# Patient Record
Sex: Male | Born: 1935
Health system: Southern US, Community
[De-identification: ages and names within clinical notes are randomized; demographics above are authoritative.]

## PROBLEM LIST (undated history)

## (undated) DIAGNOSIS — E119 Type 2 diabetes mellitus without complications: Secondary | ICD-10-CM

## (undated) DIAGNOSIS — Z969 Presence of functional implant, unspecified: Secondary | ICD-10-CM

## (undated) DIAGNOSIS — I219 Acute myocardial infarction, unspecified: Secondary | ICD-10-CM

## (undated) DIAGNOSIS — R600 Localized edema: Secondary | ICD-10-CM

## (undated) DIAGNOSIS — C760 Malignant neoplasm of head, face and neck: Secondary | ICD-10-CM

## (undated) DIAGNOSIS — I2510 Atherosclerotic heart disease of native coronary artery without angina pectoris: Secondary | ICD-10-CM

## (undated) DIAGNOSIS — Z7901 Long term (current) use of anticoagulants: Secondary | ICD-10-CM

## (undated) DIAGNOSIS — I739 Peripheral vascular disease, unspecified: Secondary | ICD-10-CM

## (undated) DIAGNOSIS — I4891 Unspecified atrial fibrillation: Secondary | ICD-10-CM

## (undated) DIAGNOSIS — R0989 Other specified symptoms and signs involving the circulatory and respiratory systems: Secondary | ICD-10-CM

## (undated) DIAGNOSIS — I639 Cerebral infarction, unspecified: Secondary | ICD-10-CM

## (undated) DIAGNOSIS — E1142 Type 2 diabetes mellitus with diabetic polyneuropathy: Secondary | ICD-10-CM

## (undated) DIAGNOSIS — Z98811 Dental restoration status: Secondary | ICD-10-CM

## (undated) DIAGNOSIS — D509 Iron deficiency anemia, unspecified: Secondary | ICD-10-CM

## (undated) DIAGNOSIS — M1990 Unspecified osteoarthritis, unspecified site: Secondary | ICD-10-CM

## (undated) DIAGNOSIS — I10 Essential (primary) hypertension: Secondary | ICD-10-CM

## (undated) DIAGNOSIS — L0390 Cellulitis, unspecified: Secondary | ICD-10-CM

## (undated) DIAGNOSIS — Z9289 Personal history of other medical treatment: Secondary | ICD-10-CM

## (undated) HISTORY — DX: Long term (current) use of anticoagulants: Z79.01

## (undated) HISTORY — PX: ORIF TIBIA FRACTURE: SHX5416

## (undated) HISTORY — DX: Atherosclerotic heart disease of native coronary artery without angina pectoris: I25.10

## (undated) HISTORY — PX: FRACTURE SURGERY: SHX138

## (undated) HISTORY — PX: TONSILLECTOMY: SUR1361

## (undated) HISTORY — DX: Essential (primary) hypertension: I10

## (undated) HISTORY — PX: CARDIAC CATHETERIZATION: SHX172

## (undated) HISTORY — DX: Cerebral infarction, unspecified: I63.9

## (undated) HISTORY — DX: Unspecified atrial fibrillation: I48.91

## (undated) HISTORY — DX: Cellulitis, unspecified: L03.90

## (undated) HISTORY — DX: Peripheral vascular disease, unspecified: I73.9

## (undated) HISTORY — DX: Acute myocardial infarction, unspecified: I21.9

## (undated) HISTORY — DX: Localized edema: R60.0

---

## 1989-06-23 HISTORY — PX: CORONARY ARTERY BYPASS GRAFT: SHX141

## 1991-10-24 HISTORY — PX: SQUAMOUS CELL CARCINOMA EXCISION: SHX2433

## 2001-03-04 ENCOUNTER — Other Ambulatory Visit: Admission: RE | Admit: 2001-03-04 | Discharge: 2001-03-04 | Payer: Self-pay | Admitting: Urology

## 2001-03-15 ENCOUNTER — Encounter: Payer: Self-pay | Admitting: Urology

## 2001-03-15 ENCOUNTER — Ambulatory Visit (HOSPITAL_COMMUNITY): Admission: RE | Admit: 2001-03-15 | Discharge: 2001-03-15 | Payer: Self-pay | Admitting: Urology

## 2001-03-28 ENCOUNTER — Encounter: Payer: Self-pay | Admitting: Urology

## 2001-03-28 ENCOUNTER — Ambulatory Visit (HOSPITAL_COMMUNITY): Admission: RE | Admit: 2001-03-28 | Discharge: 2001-03-28 | Payer: Self-pay | Admitting: Urology

## 2001-03-31 ENCOUNTER — Inpatient Hospital Stay (HOSPITAL_COMMUNITY): Admission: EM | Admit: 2001-03-31 | Discharge: 2001-04-02 | Payer: Self-pay | Admitting: Cardiology

## 2001-03-31 ENCOUNTER — Encounter: Payer: Self-pay | Admitting: Emergency Medicine

## 2001-04-15 ENCOUNTER — Inpatient Hospital Stay (HOSPITAL_COMMUNITY): Admission: AD | Admit: 2001-04-15 | Discharge: 2001-04-17 | Payer: Self-pay | Admitting: Cardiology

## 2001-04-15 ENCOUNTER — Encounter: Payer: Self-pay | Admitting: Cardiology

## 2003-02-23 ENCOUNTER — Encounter: Payer: Self-pay | Admitting: Family Medicine

## 2003-02-23 ENCOUNTER — Ambulatory Visit (HOSPITAL_COMMUNITY): Admission: RE | Admit: 2003-02-23 | Discharge: 2003-02-23 | Payer: Self-pay | Admitting: Family Medicine

## 2003-02-25 ENCOUNTER — Encounter (HOSPITAL_COMMUNITY): Admission: RE | Admit: 2003-02-25 | Discharge: 2003-03-27 | Payer: Self-pay | Admitting: Family Medicine

## 2003-02-25 ENCOUNTER — Encounter: Payer: Self-pay | Admitting: Family Medicine

## 2004-12-23 ENCOUNTER — Ambulatory Visit: Payer: Self-pay | Admitting: Cardiology

## 2004-12-26 ENCOUNTER — Ambulatory Visit: Payer: Self-pay | Admitting: *Deleted

## 2004-12-26 ENCOUNTER — Ambulatory Visit (HOSPITAL_COMMUNITY): Admission: RE | Admit: 2004-12-26 | Discharge: 2004-12-26 | Payer: Self-pay | Admitting: Cardiology

## 2004-12-29 ENCOUNTER — Ambulatory Visit: Payer: Self-pay | Admitting: *Deleted

## 2005-01-09 ENCOUNTER — Ambulatory Visit: Payer: Self-pay | Admitting: Cardiology

## 2005-01-16 ENCOUNTER — Ambulatory Visit: Payer: Self-pay | Admitting: Cardiology

## 2005-02-01 ENCOUNTER — Ambulatory Visit: Payer: Self-pay | Admitting: *Deleted

## 2005-02-16 ENCOUNTER — Ambulatory Visit: Payer: Self-pay | Admitting: Cardiology

## 2005-03-24 ENCOUNTER — Inpatient Hospital Stay (HOSPITAL_COMMUNITY): Admission: AD | Admit: 2005-03-24 | Discharge: 2005-03-29 | Payer: Self-pay | Admitting: Family Medicine

## 2005-06-13 ENCOUNTER — Inpatient Hospital Stay (HOSPITAL_COMMUNITY): Admission: AD | Admit: 2005-06-13 | Discharge: 2005-06-19 | Payer: Self-pay | Admitting: General Surgery

## 2005-06-16 HISTORY — PX: INCISION AND DRAINAGE ABSCESS: SHX5864

## 2005-06-20 ENCOUNTER — Encounter (HOSPITAL_COMMUNITY): Admission: RE | Admit: 2005-06-20 | Discharge: 2005-07-21 | Payer: Self-pay | Admitting: General Surgery

## 2005-07-24 ENCOUNTER — Encounter (HOSPITAL_COMMUNITY): Admission: RE | Admit: 2005-07-24 | Discharge: 2005-08-23 | Payer: Self-pay | Admitting: General Surgery

## 2005-10-03 ENCOUNTER — Ambulatory Visit (HOSPITAL_COMMUNITY): Admission: RE | Admit: 2005-10-03 | Discharge: 2005-10-03 | Payer: Self-pay | Admitting: General Surgery

## 2005-10-17 ENCOUNTER — Ambulatory Visit (HOSPITAL_COMMUNITY): Admission: RE | Admit: 2005-10-17 | Discharge: 2005-10-17 | Payer: Self-pay | Admitting: Family Medicine

## 2006-10-24 ENCOUNTER — Ambulatory Visit: Payer: Self-pay | Admitting: Cardiology

## 2006-10-29 ENCOUNTER — Ambulatory Visit: Payer: Self-pay | Admitting: Cardiology

## 2006-10-29 ENCOUNTER — Encounter (HOSPITAL_COMMUNITY): Admission: RE | Admit: 2006-10-29 | Discharge: 2006-11-28 | Payer: Self-pay | Admitting: Cardiology

## 2006-11-05 ENCOUNTER — Ambulatory Visit: Payer: Self-pay | Admitting: Cardiology

## 2006-11-20 ENCOUNTER — Ambulatory Visit (HOSPITAL_COMMUNITY): Admission: RE | Admit: 2006-11-20 | Discharge: 2006-11-20 | Payer: Self-pay | Admitting: General Surgery

## 2006-12-05 ENCOUNTER — Ambulatory Visit: Payer: Self-pay | Admitting: Cardiology

## 2007-01-02 ENCOUNTER — Ambulatory Visit: Payer: Self-pay | Admitting: Cardiology

## 2007-01-30 ENCOUNTER — Ambulatory Visit: Payer: Self-pay | Admitting: Cardiology

## 2007-03-05 ENCOUNTER — Ambulatory Visit: Payer: Self-pay | Admitting: Cardiology

## 2007-03-20 ENCOUNTER — Ambulatory Visit: Payer: Self-pay | Admitting: Cardiology

## 2007-04-23 ENCOUNTER — Ambulatory Visit: Payer: Self-pay | Admitting: Cardiology

## 2007-05-20 ENCOUNTER — Inpatient Hospital Stay (HOSPITAL_COMMUNITY): Admission: EM | Admit: 2007-05-20 | Discharge: 2007-05-21 | Payer: Self-pay | Admitting: Emergency Medicine

## 2007-05-23 ENCOUNTER — Ambulatory Visit: Payer: Self-pay | Admitting: Cardiology

## 2007-05-24 ENCOUNTER — Encounter (HOSPITAL_COMMUNITY): Admission: RE | Admit: 2007-05-24 | Discharge: 2007-06-23 | Payer: Self-pay | Admitting: Internal Medicine

## 2007-06-12 ENCOUNTER — Ambulatory Visit: Payer: Self-pay | Admitting: Cardiovascular Disease

## 2007-06-21 ENCOUNTER — Ambulatory Visit: Payer: Self-pay | Admitting: Cardiology

## 2007-07-22 ENCOUNTER — Ambulatory Visit: Payer: Self-pay | Admitting: Cardiology

## 2007-08-19 ENCOUNTER — Ambulatory Visit: Payer: Self-pay | Admitting: Internal Medicine

## 2007-09-02 ENCOUNTER — Ambulatory Visit: Payer: Self-pay | Admitting: Cardiology

## 2007-09-16 ENCOUNTER — Ambulatory Visit: Payer: Self-pay | Admitting: Cardiology

## 2007-09-23 ENCOUNTER — Ambulatory Visit: Payer: Self-pay | Admitting: Cardiology

## 2007-10-07 ENCOUNTER — Ambulatory Visit: Payer: Self-pay | Admitting: Cardiology

## 2007-10-11 ENCOUNTER — Ambulatory Visit: Payer: Self-pay | Admitting: Cardiology

## 2007-10-25 ENCOUNTER — Ambulatory Visit: Payer: Self-pay | Admitting: Cardiology

## 2007-11-11 ENCOUNTER — Ambulatory Visit: Payer: Self-pay | Admitting: Cardiology

## 2007-12-09 ENCOUNTER — Ambulatory Visit: Payer: Self-pay | Admitting: Cardiology

## 2007-12-12 ENCOUNTER — Ambulatory Visit: Payer: Self-pay | Admitting: Cardiovascular Disease

## 2007-12-18 ENCOUNTER — Ambulatory Visit: Payer: Self-pay | Admitting: Cardiovascular Disease

## 2007-12-25 ENCOUNTER — Ambulatory Visit: Payer: Self-pay | Admitting: Cardiology

## 2008-01-08 ENCOUNTER — Ambulatory Visit: Payer: Self-pay | Admitting: Cardiology

## 2008-01-29 ENCOUNTER — Ambulatory Visit: Payer: Self-pay | Admitting: Cardiology

## 2008-02-28 ENCOUNTER — Ambulatory Visit: Payer: Self-pay | Admitting: Internal Medicine

## 2008-03-30 ENCOUNTER — Ambulatory Visit: Payer: Self-pay | Admitting: Cardiology

## 2008-04-10 ENCOUNTER — Ambulatory Visit: Payer: Self-pay | Admitting: Internal Medicine

## 2008-05-08 ENCOUNTER — Ambulatory Visit: Payer: Self-pay | Admitting: Internal Medicine

## 2008-05-29 ENCOUNTER — Ambulatory Visit: Payer: Self-pay | Admitting: Cardiology

## 2008-06-30 ENCOUNTER — Ambulatory Visit: Payer: Self-pay | Admitting: Cardiology

## 2008-07-30 ENCOUNTER — Ambulatory Visit: Payer: Self-pay | Admitting: Cardiology

## 2008-08-20 ENCOUNTER — Ambulatory Visit: Payer: Self-pay | Admitting: Cardiology

## 2008-09-28 ENCOUNTER — Ambulatory Visit: Payer: Self-pay | Admitting: Cardiology

## 2008-10-28 ENCOUNTER — Ambulatory Visit: Payer: Self-pay | Admitting: Cardiology

## 2008-11-04 ENCOUNTER — Ambulatory Visit: Payer: Self-pay | Admitting: Cardiology

## 2008-11-26 ENCOUNTER — Ambulatory Visit: Payer: Self-pay | Admitting: Cardiology

## 2008-12-24 ENCOUNTER — Ambulatory Visit: Payer: Self-pay | Admitting: Cardiology

## 2009-01-25 ENCOUNTER — Ambulatory Visit: Payer: Self-pay | Admitting: Cardiology

## 2009-02-15 ENCOUNTER — Ambulatory Visit: Payer: Self-pay | Admitting: Cardiology

## 2009-03-01 ENCOUNTER — Encounter: Payer: Self-pay | Admitting: Cardiology

## 2009-03-11 ENCOUNTER — Ambulatory Visit: Payer: Self-pay | Admitting: Cardiology

## 2009-04-08 DIAGNOSIS — I4821 Permanent atrial fibrillation: Secondary | ICD-10-CM | POA: Insufficient documentation

## 2009-04-19 ENCOUNTER — Ambulatory Visit: Payer: Self-pay | Admitting: Cardiology

## 2009-05-20 ENCOUNTER — Ambulatory Visit: Payer: Self-pay | Admitting: Cardiology

## 2009-06-07 ENCOUNTER — Encounter: Payer: Self-pay | Admitting: *Deleted

## 2009-06-21 ENCOUNTER — Ambulatory Visit: Payer: Self-pay | Admitting: Cardiology

## 2009-06-21 LAB — CONVERTED CEMR LAB: POC INR: 2

## 2009-07-22 ENCOUNTER — Ambulatory Visit: Payer: Self-pay | Admitting: Cardiology

## 2009-08-19 ENCOUNTER — Ambulatory Visit: Payer: Self-pay | Admitting: Cardiology

## 2009-09-20 ENCOUNTER — Ambulatory Visit: Payer: Self-pay | Admitting: Cardiology

## 2009-09-20 LAB — CONVERTED CEMR LAB: POC INR: 2.3

## 2009-09-23 ENCOUNTER — Telehealth (INDEPENDENT_AMBULATORY_CARE_PROVIDER_SITE_OTHER): Payer: Self-pay | Admitting: *Deleted

## 2009-10-01 ENCOUNTER — Telehealth (INDEPENDENT_AMBULATORY_CARE_PROVIDER_SITE_OTHER): Payer: Self-pay | Admitting: *Deleted

## 2009-10-04 ENCOUNTER — Encounter (INDEPENDENT_AMBULATORY_CARE_PROVIDER_SITE_OTHER): Payer: Self-pay | Admitting: *Deleted

## 2009-10-04 LAB — CONVERTED CEMR LAB
ALT: 20 units/L
ALT: 20 units/L
AST: 16 units/L
AST: 16 units/L
Albumin: 4.1 g/dL
Alkaline Phosphatase: 69 units/L
Alkaline Phosphatase: 69 units/L
BUN: 20 mg/dL
Calcium: 8.7 mg/dL
Calcium: 8.7 mg/dL
Chloride: 106 meq/L
Cholesterol: 112 mg/dL
Cholesterol: 293 mg/dL
Creatinine, Ser: 0.91 mg/dL
Glucose, Bld: 118 mg/dL
HCT: 39.6 %
HDL: 28 mg/dL
Hgb A1c MFr Bld: 6.6 %
LDL Cholesterol: 61 mg/dL
LDL Cholesterol: 61 mg/dL
MCV: 39.6 fL
Platelets: 93 10*3/uL
Potassium: 4.5 meq/L
Sodium: 140 meq/L
Total Protein: 7 g/dL
Triglycerides: 113 mg/dL
Triglycerides: 113 mg/dL

## 2009-11-15 ENCOUNTER — Ambulatory Visit: Payer: Self-pay | Admitting: Cardiology

## 2009-11-15 LAB — CONVERTED CEMR LAB: POC INR: 2.1

## 2009-12-13 ENCOUNTER — Ambulatory Visit: Payer: Self-pay | Admitting: Cardiology

## 2010-01-10 ENCOUNTER — Ambulatory Visit: Payer: Self-pay | Admitting: Cardiology

## 2010-01-10 LAB — CONVERTED CEMR LAB: POC INR: 2.4

## 2010-02-07 ENCOUNTER — Ambulatory Visit: Payer: Self-pay | Admitting: Cardiovascular Disease

## 2010-02-07 LAB — CONVERTED CEMR LAB: POC INR: 2.2

## 2010-03-04 DIAGNOSIS — Z951 Presence of aortocoronary bypass graft: Secondary | ICD-10-CM

## 2010-03-04 DIAGNOSIS — E785 Hyperlipidemia, unspecified: Secondary | ICD-10-CM | POA: Insufficient documentation

## 2010-03-04 DIAGNOSIS — R7303 Prediabetes: Secondary | ICD-10-CM | POA: Insufficient documentation

## 2010-03-04 DIAGNOSIS — I10 Essential (primary) hypertension: Secondary | ICD-10-CM

## 2010-03-04 DIAGNOSIS — E119 Type 2 diabetes mellitus without complications: Secondary | ICD-10-CM

## 2010-03-07 ENCOUNTER — Encounter (INDEPENDENT_AMBULATORY_CARE_PROVIDER_SITE_OTHER): Payer: Self-pay | Admitting: *Deleted

## 2010-03-07 ENCOUNTER — Ambulatory Visit: Payer: Self-pay | Admitting: Cardiology

## 2010-03-08 ENCOUNTER — Ambulatory Visit: Payer: Self-pay | Admitting: Cardiology

## 2010-03-09 ENCOUNTER — Encounter: Payer: Self-pay | Admitting: Cardiology

## 2010-03-11 ENCOUNTER — Encounter (INDEPENDENT_AMBULATORY_CARE_PROVIDER_SITE_OTHER): Payer: Self-pay | Admitting: *Deleted

## 2010-03-11 LAB — CONVERTED CEMR LAB: OCCULT 3: NEGATIVE

## 2010-03-14 ENCOUNTER — Ambulatory Visit: Payer: Self-pay | Admitting: Cardiology

## 2010-03-17 ENCOUNTER — Encounter (INDEPENDENT_AMBULATORY_CARE_PROVIDER_SITE_OTHER): Payer: Self-pay | Admitting: *Deleted

## 2010-04-04 ENCOUNTER — Ambulatory Visit: Payer: Self-pay | Admitting: Cardiology

## 2010-04-04 LAB — CONVERTED CEMR LAB: POC INR: 2.3

## 2010-05-02 ENCOUNTER — Ambulatory Visit: Payer: Self-pay | Admitting: Cardiology

## 2010-05-02 LAB — CONVERTED CEMR LAB: POC INR: 2.1

## 2010-06-01 ENCOUNTER — Ambulatory Visit: Payer: Self-pay | Admitting: Cardiology

## 2010-06-29 ENCOUNTER — Ambulatory Visit: Payer: Self-pay | Admitting: Cardiology

## 2010-06-29 LAB — CONVERTED CEMR LAB: POC INR: 1.9

## 2010-07-27 ENCOUNTER — Ambulatory Visit: Payer: Self-pay | Admitting: Cardiology

## 2010-08-24 ENCOUNTER — Ambulatory Visit: Payer: Self-pay | Admitting: Cardiology

## 2010-09-21 ENCOUNTER — Ambulatory Visit: Payer: Self-pay | Admitting: Cardiology

## 2010-10-19 ENCOUNTER — Ambulatory Visit: Payer: Self-pay | Admitting: Cardiology

## 2010-10-19 LAB — CONVERTED CEMR LAB: POC INR: 2.3

## 2010-11-16 ENCOUNTER — Ambulatory Visit: Admission: RE | Admit: 2010-11-16 | Discharge: 2010-11-16 | Payer: Self-pay | Source: Home / Self Care

## 2010-11-16 LAB — CONVERTED CEMR LAB: POC INR: 7.7

## 2010-11-21 ENCOUNTER — Ambulatory Visit: Admission: RE | Admit: 2010-11-21 | Discharge: 2010-11-21 | Payer: Self-pay | Source: Home / Self Care

## 2010-11-24 NOTE — Medication Information (Signed)
Summary: ccr-lr  Anticoagulant Therapy  Managed by: Edrick Oh, RN PCP: Dr. Newt Minion Supervising MD: Domenic Polite MD, Mikeal Hawthorne Indication 1: Atrial Fibrillation (ICD-427.31) Lab Used: [facility] [place] Site: [place] INR POC 2.0  Dietary changes: no    Health status changes: no    Bleeding/hemorrhagic complications: no    Recent/future hospitalizations: no    Any changes in medication regimen? no    Recent/future dental: no  Any missed doses?: yes     Details: missed 1 dose  Is patient compliant with meds? yes       Allergies: 1)  ! Amoxicillin 2)  ! Sulfa  Anticoagulation Management History:      The patient is taking warfarin and comes in today for a routine follow up visit.  Positive risk factors for bleeding include an age of 75 years or older and presence of serious comorbidities.  The bleeding index is 'intermediate risk'.  Positive CHADS2 values include History of HTN and History of Diabetes.  Negative CHADS2 values include Age > 73 years old.  The start date was 12/23/2004.  Anticoagulation responsible provider: Domenic Polite MD, Mikeal Hawthorne.  INR POC: 2.0.  Cuvette Lot#: [id number].  Exp: 10/11.    Anticoagulation Management Assessment/Plan:      The patient's current anticoagulation dose is Warfarin sodium 2.5 mg tabs: Use as directed by Anticoagualtion Clinic.  The target INR is 2 - 3.  The next INR is due 06/29/2010.  Anticoagulation instructions were given to patient.  Results were reviewed/authorized by Edrick Oh, RN.  He was notified by Edrick Oh RN.         Prior Anticoagulation Instructions: INR 2.1 Continue coumadin 2.59m once daily   Current Anticoagulation Instructions: INR 2.0 Take coumadin 1 1/2 tablets tonight then resume 1 tablet once daily

## 2010-11-24 NOTE — Medication Information (Signed)
Summary: ccr-lr  Anticoagulant Therapy  Managed by: Edrick Oh, RN PCP: Dr. Newt Minion Supervising MD: Lattie Haw MD, Herbie Baltimore Indication 1: Atrial Fibrillation (ICD-427.31) Lab Used: [facility] [place] Site:  INR POC 2.3  Dietary changes: no    Health status changes: no    Bleeding/hemorrhagic complications: no    Recent/future hospitalizations: no    Any changes in medication regimen? no    Recent/future dental: no  Any missed doses?: no       Is patient compliant with meds? yes       Allergies: 1)  ! Amoxicillin 2)  ! Sulfa  Anticoagulation Management History:      The patient is taking warfarin and comes in today for a routine follow up visit.  Positive risk factors for bleeding include an age of 75 years or older and presence of serious comorbidities.  The bleeding index is 'intermediate risk'.  Positive CHADS2 values include History of HTN and History of Diabetes.  Negative CHADS2 values include Age > 76 years old.  The start date was 12/23/2004.  Anticoagulation responsible provider: Lattie Haw MD, Herbie Baltimore.  INR POC: 2.3.  Cuvette Lot#: [id number].  Exp: 10/11.    Anticoagulation Management Assessment/Plan:      The patient's current anticoagulation dose is Warfarin sodium 2.5 mg tabs: Use as directed by Anticoagualtion Clinic.  The target INR is 2 - 3.  The next INR is due 05/02/2010.  Anticoagulation instructions were given to patient.  Results were reviewed/authorized by Edrick Oh, RN.  He was notified by Edrick Oh RN.         Prior Anticoagulation Instructions: INR 2.1 Continue coumadin 2.36m once daily   Current Anticoagulation Instructions: INR 2.3 Continue coumadin 2.525monce daily

## 2010-11-24 NOTE — Medication Information (Signed)
Summary: ccr-lr  Anticoagulant Therapy  Managed by: Edrick Oh, RN PCP: Dr. Newt Minion Supervising MD: Domenic Polite MD, Mikeal Hawthorne Indication 1: Atrial Fibrillation (ICD-427.31) Lab Used: [facility] [place] Site:  INR POC 7.7  Dietary changes: yes       Details: Been on low glycemic diet x 2 wks with rapid wt loss  Health status changes: yes       Details: Had 1 month of diarrhea   Improved now  Bleeding/hemorrhagic complications: no    Recent/future hospitalizations: no    Any changes in medication regimen? no    Recent/future dental: no  Any missed doses?: no       Is patient compliant with meds? yes       Allergies: 1)  ! Amoxicillin 2)  ! Sulfa  Anticoagulation Management History:      The patient is taking warfarin and comes in today for a routine follow up visit.  Positive risk factors for bleeding include an age of 75 years or older and presence of serious comorbidities.  The bleeding index is 'intermediate risk'.  Positive CHADS2 values include History of HTN and History of Diabetes.  Negative CHADS2 values include Age > 23 years old.  The start date was 12/23/2004.  Anticoagulation responsible provider: Domenic Polite MD, Mikeal Hawthorne.  INR POC: 7.7.  Cuvette Lot#: [id number].  Exp: 10/11.    Anticoagulation Management Assessment/Plan:      The patient's current anticoagulation dose is Warfarin sodium 2.5 mg tabs: Use as directed by Anticoagualtion Clinic.  The target INR is 2 - 3.  The next INR is due 11/21/2010.  Anticoagulation instructions were given to patient.  Results were reviewed/authorized by Edrick Oh, RN.  He was notified by Edrick Oh RN.         Prior Anticoagulation Instructions: INR 2.3 Continue coumadin 2.59m once daily   Current Anticoagulation Instructions: INR 7.7 Had several weeks of diarrhea.  Started on Low glycemic diet and lost a lot of weight pset 2 weeks. Hold coumadin x 4 days, take 1 tablet on Sunday and recheck INR  on 11/21/10

## 2010-11-24 NOTE — Medication Information (Signed)
Summary: ccr-lr  Anticoagulant Therapy  Managed by: Edrick Oh, RN PCP: Dr. Newt Minion Supervising MD: Lattie Haw MD, Herbie [place] Indication 1: Atrial Fibrillation (ICD-427.31) Lab Used: [facility] [place] Site: [place] INR POC 2.1  Dietary changes: no    Health status changes: no    Bleeding/hemorrhagic complications: no    Recent/future hospitalizations: no    Any changes in medication regimen? no    Recent/future dental: no  Any missed doses?: no       Is patient compliant with meds? yes       Allergies: 1)  ! Amoxicillin 2)  ! Sulfa  Anticoagulation Management History:      The patient is taking warfarin and comes in today for a routine follow up visit.  Positive risk factors for bleeding include an age of 75 years or older and presence of serious comorbidities.  The bleeding index is 'intermediate risk'.  Positive CHADS2 values include History of HTN and History of Diabetes.  Negative CHADS2 values include Age > 78 years old.  The start date was 12/23/2004.  Anticoagulation responsible provider: Lattie Haw MD, Herbie Baltimore.  INR POC: 2.1.  Exp: 10/11.    Anticoagulation Management Assessment/Plan:      The patient's current anticoagulation dose is Warfarin sodium 2.5 mg tabs: Use as directed by Anticoagualtion Clinic.  The target INR is 2 - 3.  The next INR is due 09/21/2010.  Anticoagulation instructions were given to patient.  Results were reviewed/authorized by Edrick Oh, RN.  He was notified by Edrick Oh RN.         Prior Anticoagulation Instructions: INR 2.1 Continue couamdin 2.71m once daily   Current Anticoagulation Instructions: INR 2.1 Continue coumadin 2.586monce daily

## 2010-11-24 NOTE — Medication Information (Signed)
Summary: ccr-lr  Anticoagulant Therapy  Managed by: Edrick Oh, RN PCP: Dr. Newt Minion Supervising MD: Lattie Haw MD, Herbie Baltimore Indication 1: Atrial Fibrillation (ICD-427.31) Lab Used: [facility] [place] Site: [place] INR POC 2.3  Dietary changes: no    Health status changes: no    Bleeding/hemorrhagic complications: no    Recent/future hospitalizations: no    Any changes in medication regimen? no    Recent/future dental: no  Any missed doses?: yes     Details: missed 2 doses this month  Is patient compliant with meds? yes       Allergies: 1)  ! Amoxicillin 2)  ! Sulfa  Anticoagulation Management History:      The patient is taking warfarin and comes in today for a routine follow up visit.  Positive risk factors for bleeding include an age of 75 years or older and presence of serious comorbidities.  The bleeding index is 'intermediate risk'.  Positive CHADS2 values include History of HTN and History of Diabetes.  Negative CHADS2 values include Age > 85 years old.  The start date was 12/23/2004.  Anticoagulation responsible provider: Lattie Haw MD, Herbie Baltimore.  INR POC: 2.3.  Cuvette Lot#: [id number].  Exp: 10/11.    Anticoagulation Management Assessment/Plan:      The patient's current anticoagulation dose is Warfarin sodium 2.5 mg tabs: Use as directed by Anticoagualtion Clinic.  The target INR is 2 - 3.  The next INR is due 11/16/2010.  Anticoagulation instructions were given to patient.  Results were reviewed/authorized by Edrick Oh, RN.  He was notified by Edrick Oh RN.         Prior Anticoagulation Instructions: INR 2.3 Continue coumadin 2.24m once daily   Current Anticoagulation Instructions: Same as Prior Instructions.

## 2010-11-24 NOTE — Medication Information (Signed)
Summary: ccr-lr  Anticoagulant Therapy  Managed by: Edrick Oh, RN Supervising MD: Johnsie Cancel MD, Collier Salina Indication 1: Atrial Fibrillation (ICD-427.31) Lab Used: [facility] [place] Site: [place] INR POC 2.2  Dietary changes: no    Health status changes: no    Bleeding/hemorrhagic complications: no    Recent/future hospitalizations: no    Any changes in medication regimen? no    Recent/future dental: no  Any missed doses?: no       Is patient compliant with meds? yes       Anticoagulation Management History:      The patient is taking warfarin and comes in today for a routine follow up visit.  Positive risk factors for bleeding include an age of 30 years or older.  The bleeding index is 'intermediate risk'.  Positive CHADS2 values include History of HTN.  Negative CHADS2 values include Age > 47 years old.  The start date was 12/23/2004.  Anticoagulation responsible provider: Johnsie Cancel MD, Collier Salina.  INR POC: 2.2.  Cuvette Lot#: [id number].  Exp: 10/11.    Anticoagulation Management Assessment/Plan:      The target INR is 2 - 3.  The next INR is due 03/07/2010.  Anticoagulation instructions were given to patient.  Results were reviewed/authorized by Edrick Oh, RN.  He was notified by Edrick Oh RN.         Prior Anticoagulation Instructions: INR 2.4 Continue coumadin 2.70m once daily   Current Anticoagulation Instructions: INR 2.2 Continue coumadin 2.531monce daily

## 2010-11-24 NOTE — Medication Information (Signed)
Summary: ccr-lr  Anticoagulant Therapy  Managed by: Edrick Oh, RN Supervising MD: Lattie Haw MD, Herbie Baltimore Indication 1: Atrial Fibrillation (ICD-427.31) Lab Used: [facility]  Site: [place] INR POC 2.1  Dietary changes: no    Health status changes: no    Bleeding/hemorrhagic complications: no    Recent/future hospitalizations: no    Any changes in medication regimen? no    Recent/future dental: no  Any missed doses?: no       Is patient compliant with meds? yes       Anticoagulation Management History:      The patient is taking warfarin and comes in today for a routine follow up visit.  Positive risk factors for bleeding include an age of 70 years or older.  The bleeding index is 'intermediate risk'.  Positive CHADS2 values include History of HTN.  Negative CHADS2 values include Age > 66 years old.  The start date was 12/23/2004.  Anticoagulation responsible provider: Lattie Haw MD, Herbie Baltimore.  INR POC: 2.1.  Cuvette Lot#: [id number].  Exp: 10/11.    Anticoagulation Management Assessment/Plan:      The target INR is 2 - 3.  The next INR is due 04/04/2010.  Anticoagulation instructions were given to patient.  Results were reviewed/authorized by Edrick Oh, RN.  He was notified by Edrick Oh RN.         Prior Anticoagulation Instructions: INR 2.2 Continue coumadin 2.99m once daily   Current Anticoagulation Instructions: INR 2.1 Continue coumadin 2.531monce daily

## 2010-11-24 NOTE — Medication Information (Signed)
Summary: ccr-lr  Anticoagulant Therapy  Managed by: Edrick Oh, RN Supervising MD: Lattie Haw MD, Herbie Baltimore Indication 1: Atrial Fibrillation (ICD-427.31) Lab Used: [facility] [place] Site:  INR POC 2.4  Dietary changes: no    Health status changes: no    Bleeding/hemorrhagic complications: no    Recent/future hospitalizations: no    Any changes in medication regimen? no    Recent/future dental: no  Any missed doses?: no       Is patient compliant with meds? yes       Anticoagulation Management History:      The patient is taking warfarin and comes in today for a routine follow up visit.  Positive risk factors for bleeding include an age of 75 years or older.  The bleeding index is 'intermediate risk'.  Positive CHADS2 values include History of HTN.  Negative CHADS2 values include Age > 101 years old.  The start date was 12/23/2004.  Anticoagulation responsible provider: Lattie Haw MD, Herbie Baltimore.  INR POC: 2.4.  Cuvette Lot#: [id number].  Exp: 10/11.    Anticoagulation Management Assessment/Plan:      The target INR is 2 - 3.  The next INR is due 02/07/2010.  Anticoagulation instructions were given to patient.  Results were reviewed/authorized by Edrick Oh, RN.  He was notified by Edrick Oh RN.         Prior Anticoagulation Instructions: INR 1.9 Take coumadin 2 tablets tonight then resume 1 tablet once daily   Current Anticoagulation Instructions: INR 2.4 Continue coumadin 2.20m once daily

## 2010-11-24 NOTE — Medication Information (Signed)
Summary: ccr-lr  Anticoagulant Therapy  Managed by: Edrick Oh, RN PCP: Dr. Newt Minion Supervising MD: Lattie Haw MD, Herbie Baltimore Indication 1: Atrial Fibrillation (ICD-427.31) Lab Used: [facility] [place] Site: [place] INR POC 1.9  Dietary changes: no    Health status changes: no    Bleeding/hemorrhagic complications: no    Recent/future hospitalizations: no    Any changes in medication regimen? no    Recent/future dental: no  Any missed doses?: yes     Details: missed 2 doses last month  Is patient compliant with meds? yes       Allergies: 1)  ! Amoxicillin 2)  ! Sulfa  Anticoagulation Management History:      The patient is taking warfarin and comes in today for a routine follow up visit.  Positive risk factors for bleeding include an age of 5 years or older and presence of serious comorbidities.  The bleeding index is 'intermediate risk'.  Positive CHADS2 values include History of HTN and History of Diabetes.  Negative CHADS2 values include Age > 59 years old.  The start date was 12/23/2004.  Anticoagulation responsible provider: Lattie Haw MD, Herbie Baltimore.  INR POC: 1.9.  Cuvette Lot#: [id number].  Exp: 10/11.    Anticoagulation Management Assessment/Plan:      The patient's current anticoagulation dose is Warfarin sodium 2.5 mg tabs: Use as directed by Anticoagualtion Clinic.  The target INR is 2 - 3.  The next INR is due 07/27/2010.  Anticoagulation instructions were given to patient.  Results were reviewed/authorized by Edrick Oh, RN.  He was notified by Edrick Oh RN.         Prior Anticoagulation Instructions: INR 2.0 Take coumadin 1 1/2 tablets tonight then resume 1 tablet once daily   Current Anticoagulation Instructions: INR 1.9 Take 1 1/2 tablets tonight and tomorrow night then resume 1 tablet once daily

## 2010-11-24 NOTE — Medication Information (Signed)
Summary: ccr-lr  Anticoagulant Therapy  Managed by: Edrick Oh, RN PCP: Dr. Newt Minion Supervising MD: Domenic Polite MD, Mikeal Hawthorne Indication 1: Atrial Fibrillation (ICD-427.31) Lab Used: [facility] [place] Site: [place] INR POC 2.1  Dietary changes: no    Health status changes: no    Bleeding/hemorrhagic complications: no    Recent/future hospitalizations: no    Any changes in medication regimen? no    Recent/future dental: no  Any missed doses?: no       Is patient compliant with meds? yes       Allergies: 1)  ! Amoxicillin 2)  ! Sulfa  Anticoagulation Management History:      The patient is taking warfarin and comes in today for a routine follow up visit.  Positive risk factors for bleeding include an age of 12 years or older and presence of serious comorbidities.  The bleeding index is 'intermediate risk'.  Positive CHADS2 values include History of HTN and History of Diabetes.  Negative CHADS2 values include Age > 82 years old.  The start date was 12/23/2004.  Anticoagulation responsible Dylon Correa: Domenic Polite MD, Mikeal Hawthorne.  INR POC: 2.1.  Cuvette Lot#: [id number].  Exp: 10/11.    Anticoagulation Management Assessment/Plan:      The patient's current anticoagulation dose is Warfarin sodium 2.5 mg tabs: Use as directed by Anticoagualtion Clinic.  The target INR is 2 - 3.  The next INR is due 08/24/2010.  Anticoagulation instructions were given to patient.  Results were reviewed/authorized by Edrick Oh, RN.  He was notified by Edrick Oh RN.         Prior Anticoagulation Instructions: INR 1.9 Take 1 1/2 tablets tonight and tomorrow night then resume 1 tablet once daily   Current Anticoagulation Instructions: INR 2.1 Continue couamdin 2.55m once daily

## 2010-11-24 NOTE — Medication Information (Signed)
Summary: ccr-lr  Anticoagulant Therapy  Managed by: Edrick Oh, RN PCP: Dr. Newt Minion Supervising MD: Domenic Polite MD, Mikeal Hawthorne Indication 1: Atrial Fibrillation (ICD-427.31) Lab Used: [facility] [place] Site: [place] INR POC 2.3  Dietary changes: no    Health status changes: no    Bleeding/hemorrhagic complications: no    Recent/future hospitalizations: no    Any changes in medication regimen? no    Recent/future dental: no  Any missed doses?: no       Is patient compliant with meds? yes       Allergies: 1)  ! Amoxicillin 2)  ! Sulfa  Anticoagulation Management History:      The patient is taking warfarin and comes in today for a routine follow up visit.  Positive risk factors for bleeding include an age of 75 years or older and presence of serious comorbidities.  The bleeding index is 'intermediate risk'.  Positive CHADS2 values include History of HTN and History of Diabetes.  Negative CHADS2 values include Age > 55 years old.  The start date was 12/23/2004.  Anticoagulation responsible Lonza Shimabukuro: Domenic Polite MD, Mikeal Hawthorne.  INR POC: 2.3.  Cuvette Lot#: [id number].  Exp: 10/11.    Anticoagulation Management Assessment/Plan:      The patient's current anticoagulation dose is Warfarin sodium 2.5 mg tabs: Use as directed by Anticoagualtion Clinic.  The target INR is 2 - 3.  The next INR is due 10/19/2010.  Anticoagulation instructions were given to patient.  Results were reviewed/authorized by Edrick Oh, RN.  He was notified by Edrick Oh RN.         Prior Anticoagulation Instructions: INR 2.1 Continue coumadin 2.68m once daily   Current Anticoagulation Instructions: INR 2.3 Continue coumadin 2.577monce daily

## 2010-11-24 NOTE — Miscellaneous (Signed)
Summary: LABS CBCD,LIPIDS,CMP,A1C,10/04/2009  Clinical Lists Changes  Observations: Added new observation of CALCIUM: 8.7 mg/dL (10/04/2009 9:21) Added new observation of ALBUMIN: 4.1 g/dL (10/04/2009 9:21) Added new observation of PROTEIN, TOT: 7.0 g/dL (10/04/2009 9:21) Added new observation of SGPT (ALT): 20 units/L (10/04/2009 9:21) Added new observation of SGOT (AST): 16 units/L (10/04/2009 9:21) Added new observation of ALK PHOS: 69 units/L (10/04/2009 9:21) Added new observation of CREATININE: 0.91 mg/dL (10/04/2009 9:21) Added new observation of BUN: 20 mg/dL (10/04/2009 9:21) Added new observation of BG RANDOM: 118 mg/dL (10/04/2009 9:21) Added new observation of CO2 PLSM/SER: 24 meq/L (10/04/2009 9:21) Added new observation of CL SERUM: 106 meq/L (10/04/2009 9:21) Added new observation of K SERUM: 4.5 meq/L (10/04/2009 9:21) Added new observation of NA: 140 meq/L (10/04/2009 9:21) Added new observation of LDL: 61 mg/dL (10/04/2009 9:21) Added new observation of HDL: 28 mg/dL (10/04/2009 9:21) Added new observation of TRIGLYC TOT: 113 mg/dL (10/04/2009 9:21) Added new observation of CHOLESTEROL: 112 mg/dL (10/04/2009 9:21) Added new observation of PLATELETK/UL: 293 K/uL (10/04/2009 9:21) Added new observation of MCV: 93.0 fL (10/04/2009 9:21) Added new observation of HCT: 39.6 % (10/04/2009 9:21) Added new observation of HGB: 12.8 g/dL (10/04/2009 9:21) Added new observation of WBC COUNT: 6.7 10*3/microliter (10/04/2009 9:21) Added new observation of HGBA1C: 6.6 % (10/04/2009 9:21)

## 2010-11-24 NOTE — Medication Information (Signed)
Summary: ccr-lr  Anticoagulant Therapy  Managed by: Edrick Oh, RN PCP: Dr. Newt Minion Supervising MD: Lattie Haw MD, Herbie Baltimore Indication 1: Atrial Fibrillation (ICD-427.31) Lab Used: [facility] [place] Site: [place] INR POC 2.1  Dietary changes: no    Health status changes: no    Bleeding/hemorrhagic complications: no    Recent/future hospitalizations: no    Any changes in medication regimen? no    Recent/future dental: no  Any missed doses?: yes     Details: missed 1 dose 04/24/10  Is patient compliant with meds? yes       Allergies: 1)  ! Amoxicillin 2)  ! Sulfa  Anticoagulation Management History:      The patient is taking warfarin and comes in today for a routine follow up visit.  Positive risk factors for bleeding include an age of 75 years or older and presence of serious comorbidities.  The bleeding index is 'intermediate risk'.  Positive CHADS2 values include History of HTN and History of Diabetes.  Negative CHADS2 values include Age > 62 years old.  The start date was 12/23/2004.  Anticoagulation responsible provider: Lattie Haw MD, Herbie Baltimore.  INR POC: 2.1.  Cuvette Lot#: [id number].  Exp: 10/11.    Anticoagulation Management Assessment/Plan:      The patient's current anticoagulation dose is Warfarin sodium 2.5 mg tabs: Use as directed by Anticoagualtion Clinic.  The target INR is 2 - 3.  The next INR is due 06/01/2010.  Anticoagulation instructions were given to patient.  Results were reviewed/authorized by Edrick Oh, RN.  He was notified by Edrick Oh RN.         Prior Anticoagulation Instructions: INR 2.3 Continue coumadin 2.34m once daily   Current Anticoagulation Instructions: INR 2.1 Continue coumadin 2.5102monce daily

## 2010-11-24 NOTE — Miscellaneous (Signed)
Summary: hemoccult cards 03/11/2010  Clinical Lists Changes  Observations: Added new observation of HEMOCCULT 3: neg (03/11/2010 10:14) Added new observation of HEMOCCULT 2: neg (03/11/2010 10:14) Added new observation of HEMOCCULT 1: neg (03/11/2010 10:14)

## 2010-11-24 NOTE — Medication Information (Signed)
Summary: ccr-lr  Anticoagulant Therapy  Managed by: Edrick Oh, RN Supervising MD: Lattie Haw MD, Herbie Baltimore Indication 1: Atrial Fibrillation (ICD-427.31) Lab Used: [facility]  Site: [place] INR POC 1.9  Dietary changes: no    Health status changes: no    Bleeding/hemorrhagic complications: no    Recent/future hospitalizations: no    Any changes in medication regimen? no    Recent/future dental: no  Any missed doses?: yes     Details: missed 1 dose 12/01/09  Is patient compliant with meds? yes       Anticoagulation Management History:      The patient is taking warfarin and comes in today for a routine follow up visit.  Positive risk factors for bleeding include an age of 73 years or older.  The bleeding index is 'intermediate risk'.  Positive CHADS2 values include History of HTN.  Negative CHADS2 values include Age > 36 years old.  The start date was 12/23/2004.  Anticoagulation responsible provider: Lattie Haw MD, Herbie Baltimore.  INR POC: 1.9.  Cuvette Lot#: [id number].  Exp: 10/11.    Anticoagulation Management Assessment/Plan:      The target INR is 2 - 3.  The next INR is due 01/10/2010.  Anticoagulation instructions were given to patient.  Results were reviewed/authorized by Edrick Oh, RN.  He was notified by Edrick Oh RN.         Prior Anticoagulation Instructions: INR 2.1  Continue coumadin 2.41m once daily   Current Anticoagulation Instructions: INR 1.9 Take coumadin 2 tablets tonight then resume 1 tablet once daily

## 2010-11-24 NOTE — Assessment & Plan Note (Signed)
Summary: past due for 1 yr f/u/tg  Medications Added CENTRUM SILVER  TABS (MULTIPLE VITAMINS-MINERALS) TAKE  1 TAB DAILY METFORMIN HCL 500 MG TABS (METFORMIN HCL) TAKE 1 TAB DAILY AMLODIPINE BESYLATE 10 MG TABS (AMLODIPINE BESYLATE) TAKE 1 TAB DAILY LOSARTAN POTASSIUM-HCTZ 100-25 MG TABS (LOSARTAN POTASSIUM-HCTZ) TAKE 1 TAB DAILY WARFARIN SODIUM 2.5 MG TABS (WARFARIN SODIUM) TAKE AS DIRECTED BY COUMADIN CLINIC BYSTOLIC 5 MG TABS (NEBIVOLOL HCL) TAKE 1 TAB DAILY BETAMETHASONE DIPROPIONATE 0.05 % CREA (BETAMETHASONE DIPROPIONATE) APPLY AS DIRECTED FISH OIL 1000 MG CAPS (OMEGA-3 FATTY ACIDS) TAKE 1 CAP DAILY      Allergies Added:   Visit Type:  Follow-up Primary Provider:  Dr. Newt Minion   History of Present Illness: [name] returns to the office somewhat beyond his anticipated followup for continued assessment and treatment of permanent atrial fibrillation, coronary artery disease and cardiovascular risk factors.  Since his last visit, he has done generally well.  He is active in helping to care for his grandchildren, but experiences no chest discomfort nor dyspnea on exertion.  He has no orthopnea nor PND.  He experiences no palpitations.  He follows blood pressure at home with systolics typically in the 130s.  Anticoagulation has been stable and therapeutic.  He has had no bleeding complications.  Control of diabetes has been good.  He has had progressive numbness in his lower extremities with mild similar symptoms in his upper extremities.  He has intermittent total knee and left second toe, characterized as a hammertoe by his podiatrist and treated with padding and orthotics.  Records obtained from Dr. Karie Kirks and reviewed.  Current Medications (verified): 1)  Centrum Silver  Tabs (Multiple Vitamins-Minerals) .... Take  1 Tab Daily 2)  Metformin Hcl 500 Mg Tabs (Metformin Hcl) .... Take 1 Tab Daily 3)  Amlodipine Besylate 10 Mg Tabs (Amlodipine Besylate) .... Take 1  Tab Daily 4)  Losartan Potassium-Hctz 100-25 Mg Tabs (Losartan Potassium-Hctz) .... Take 1 Tab Daily 5)  Warfarin Sodium 2.5 Mg Tabs (Warfarin Sodium) .... Take As Directed By Coumadin Clinic 6)  Bystolic 5 Mg Tabs (Nebivolol Hcl) .... Take 1 Tab Daily 7)  Betamethasone Dipropionate 0.05 % Crea (Betamethasone Dipropionate) .... Apply As Directed 8)  Fish Oil 1000 Mg Caps (Omega-3 Fatty Acids) .... Take 1 Cap Daily  Allergies (verified): 1)  ! Amoxicillin 2)  ! Sulfa  Past History:  PMH, FH, and Social History reviewed and updated.  Review of Systems       See history of present illness.  Vital Signs:  Patient profile:   75 year old male Height:      69 inches Weight:      218 pounds BMI:     32.31 Pulse rate:   74 / minute BP sitting:   144 / 72  (right arm)  Vitals Entered By: Doretha Sou, CNA (Mar 08, 2010 11:24 AM)  Physical Exam  General:    Overweight; well developed; no acute distress:   Neck-No JVD; no carotid bruits; status post surgical intervention on the left with a well-healed scar Lungs-No tachypnea, no rales; no rhonchi; no wheezes: Cardiovascular-normal PMI; normal S1 and S2; irregular rhythm Abdomen-BS normal; soft and non-tender without masses or organomegaly:  Musculoskeletal-No deformities, no cyanosis or clubbing: Neurologic-Normal cranial nerves; symmetric strength and tone:  Skin-Warm, no significant lesions: Extremities-decreased to absent distal pulses; 1+ ankle edema:  with Doppler, right posterior tibial fairly normal; other signals were monophasic and somewhat decreased.     Impression &  Recommendations:  Problem # 1:  ATHEROSCLEROTIC CARDIOVASCULAR DISEASE (ICD-429.2) No symptoms at present to suggest recurrent myocardial ischemia, now 16 years following CABG surgery.  Grafts were last proven to be patent in 2002.  Management will continue to focus on optimal control of cardiovascular risk factors.  Problem # 2:  ATRIAL FIBRILLATION  (ICD-427.31) Patient is asymptomatic with respect to this problem, and heart rate is well-controlled.  Current therapy will be continued.  Due to the need for chronic anticoagulation, stool for Hemoccult testing will be checked.  CBC was normal 5 months ago as was a complete metabolic profile.  Problem # 3:  HYPERTENSION (ICD-401.1) Blood pressure control is adequate although systolics are slightly higher than desirable in the office.  Dosages of his current medications are maximized.  I will not add a fourth agent at the present time.  Problem # 4:  HYPERLIPIDEMIA (JEH-631.4) Lipid profile was excellent in December with total cholesterol of 112, triglycerides 113, HDL of 28 and LDL 61.  Current therapy will be continued.  Problem # 5:  DIABETES MELLITUS, TYPE II (ICD-250.00) Control is good with a recent hemoglobin A1c level of 6.6.  Treatment with an ARB may help to prevent diabetic nephropathy.  He has a diabetic neuropathy that is not causing a great deal of trouble at the present time.  I will plan to reassess this nice gentleman in one year.  Other Orders: Hemoccult Cards (Take Home) (Hemoccult Cards)  EKG  Procedure date:  03/08/2010  Findings:      Atrial fibrillation with a well-controlled ventricular rate Heart rate is 70 bpm   Patient Instructions: 1)  Your physician recommends that you schedule a follow-up appointment in: 1 year 2)  Your physician has asked that you test your stool for blood. It is necessary to test 3 different stool specimens for accuracy. You will be given 3 hemoccult cards for specimen collection. For each stool specimen, place a small portion of stool sample (from 2 different areas of the stool) into the 2 squares on the card. Close card. Repeat with 2 more stool specimens. Bring the cards back to the office for testing.

## 2010-11-24 NOTE — Medication Information (Signed)
Summary: ccr-lr  Anticoagulant Therapy  Managed by: Edrick Oh, RN Supervising MD: Lattie Haw MD, Herbie [place] Indication 1: Atrial Fibrillation (ICD-427.31) Lab Used: [facility]  Site: [place] INR POC 2.1  Dietary changes: no    Health status changes: no    Bleeding/hemorrhagic complications: no    Recent/future hospitalizations: no    Any changes in medication regimen? no    Recent/future dental: no  Any missed doses?: no       Is patient compliant with meds? yes       Anticoagulation Management History:      The patient is taking warfarin and comes in today for a routine follow up visit.  Positive risk factors for bleeding include an age of 73 years or older.  The bleeding index is 'intermediate risk'.  Positive CHADS2 values include History of HTN.  Negative CHADS2 values include Age > 73 years old.  The start date was 12/23/2004.  Anticoagulation responsible provider: Lattie Haw MD, Herbie Baltimore.  INR POC: 2.1.  Cuvette Lot#: [id number].  Exp: 10/11.    Anticoagulation Management Assessment/Plan:      The target INR is 2 - 3.  The next INR is due 12/13/2009.  Anticoagulation instructions were given to patient.  Results were reviewed/authorized by Edrick Oh, RN.  He was notified by Edrick Oh RN.         Prior Anticoagulation Instructions: INR 2.3 Continue coumadin 2.70m once daily   Current Anticoagulation Instructions: INR 2.1  Continue coumadin 2.549monce daily

## 2010-11-28 ENCOUNTER — Encounter: Payer: Self-pay | Admitting: Cardiology

## 2010-11-28 ENCOUNTER — Encounter (INDEPENDENT_AMBULATORY_CARE_PROVIDER_SITE_OTHER): Payer: Medicare Other

## 2010-11-28 DIAGNOSIS — Z7901 Long term (current) use of anticoagulants: Secondary | ICD-10-CM

## 2010-11-28 DIAGNOSIS — I4891 Unspecified atrial fibrillation: Secondary | ICD-10-CM

## 2010-11-30 NOTE — Medication Information (Signed)
Summary: ccr-lr  Anticoagulant Therapy  Managed by: Edrick Oh, RN PCP: Dr. Newt Minion Supervising MD: Lattie Haw MD, Herbie Baltimore Indication 1: Atrial Fibrillation (ICD-427.31) Lab Used: [facility] [place] Site: [place] INR POC 1.7  Dietary changes: no    Health status changes: no    Bleeding/hemorrhagic complications: no    Recent/future hospitalizations: no    Any changes in medication regimen? no    Recent/future dental: no  Any missed doses?: yes     Details: coumadin has been on hold due to elevated INR  Is patient compliant with meds? yes       Allergies: 1)  ! Amoxicillin 2)  ! Sulfa  Anticoagulation Management History:      The patient is taking warfarin and comes in today for a routine follow up visit.  Positive risk factors for bleeding include an age of 75 years or older and presence of serious comorbidities.  The bleeding index is 'intermediate risk'.  Positive CHADS2 values include History of HTN and History of Diabetes.  Negative CHADS2 values include Age > 63 years old.  The start date was 12/23/2004.  Anticoagulation responsible provider: Lattie Haw MD, Herbie Baltimore.  INR POC: 1.7.  Cuvette Lot#: [id number].  Exp: 10/11.    Anticoagulation Management Assessment/Plan:      The patient's current anticoagulation dose is Warfarin sodium 2.5 mg tabs: Use as directed by Anticoagualtion Clinic.  The target INR is 2 - 3.  The next INR is due 11/28/2010.  Anticoagulation instructions were given to patient.  Results were reviewed/authorized by Edrick Oh, RN.  He was notified by Edrick Oh RN.         Prior Anticoagulation Instructions: INR 7.7 Had several weeks of diarrhea.  Started on Low glycemic diet and lost a lot of weight pset 2 weeks. Hold coumadin x 4 days, take 1 tablet on Sunday and recheck INR on 11/21/10  Current Anticoagulation Instructions: INR 1.7 Restart coumadin at 2.76m once daily except 1.272mon Wednesdays and Saturdays

## 2010-12-08 ENCOUNTER — Encounter (INDEPENDENT_AMBULATORY_CARE_PROVIDER_SITE_OTHER): Payer: Medicare Other

## 2010-12-08 ENCOUNTER — Encounter: Payer: Self-pay | Admitting: Cardiology

## 2010-12-08 DIAGNOSIS — Z7901 Long term (current) use of anticoagulants: Secondary | ICD-10-CM

## 2010-12-08 DIAGNOSIS — I4891 Unspecified atrial fibrillation: Secondary | ICD-10-CM

## 2010-12-08 NOTE — Medication Information (Signed)
Summary: ccr-lr  Anticoagulant Therapy Managed by: Edrick Oh, RN Patient Assessment Part 2:  Have you MISSED ANY DOSES or CHANGED TABLETS?  0  Have you had any BRUISING or BLEEDING ( nose or gum bleeds,blood in urine or stool)?  Have you STARTED or STOPPED any MEDICATIONS, including OTC meds,herbals or supplements?  Have you CHANGED your DIET, especially green vegetables,or ALCOHOL intake?  Have you had any ILLNESSES or HOSPITALIZATIONS?  Have you had any signs of CLOTTING?(chest discomfort,dizziness,shortness of breath,arms tingling,slurred speech,swelling or redness in leg)       Regimen Out:    Total Weekly: 15 mg mg  Next INR Due: 12/08/2010      Allergies: 1)  ! Amoxicillin 2)  ! Sulfa  Anticoagulant Therapy  Managed by: Edrick Oh, RN PCP: Dr. Newt Minion Supervising MD: Lattie Haw MD, Herbie Baltimore Indication 1: Atrial Fibrillation (ICD-427.31) Lab Used: [facility] [place] Site: [place] INR POC 2.0  Dietary changes: no    Health status changes: no    Bleeding/hemorrhagic complications: no    Recent/future hospitalizations: no    Any changes in medication regimen? no    Recent/future dental: no  Any missed doses?: no       Is patient compliant with meds? yes          Anticoagulation Management History:      The patient is taking warfarin and comes in today for a routine follow up visit.  Positive risk factors for bleeding include an age of 73 years or older and presence of serious comorbidities.  The bleeding index is 'intermediate risk'.  Positive CHADS2 values include History of HTN and History of Diabetes.  Negative CHADS2 values include Age > 68 years old.  The start date was 12/23/2004.  Anticoagulation responsible provider: Lattie Haw MD, Herbie Baltimore.  INR POC: 2.0.  Cuvette Lot#: [id number].  Exp: 10/11.    Anticoagulation Management Assessment/Plan:      The patient's current anticoagulation dose is Warfarin sodium  2.5 mg tabs: Use as directed by Anticoagualtion Clinic.  The target INR is 2 - 3.  The next INR is due 12/08/2010.  Anticoagulation instructions were given to patient.  Results were reviewed/authorized by Edrick Oh, RN.  He was notified by Edrick Oh RN.         Prior Anticoagulation Instructions: INR 1.7 Restart coumadin at 2.37m once daily except 1.233mon Wednesdays and Saturdays  Current Anticoagulation Instructions: INR 2.0 Continue coumadin 2.56m88mnce daily except 1.256m39m Wednesdays and Saturdays

## 2010-12-14 NOTE — Medication Information (Signed)
Summary: ccr-lr  Anticoagulant Therapy  Managed by: Edrick Oh, RN PCP: Dr. Newt Minion Supervising MD: Domenic Polite MD, Mikeal Hawthorne Indication 1: Atrial Fibrillation (ICD-427.31) Lab Used: [facility] [place] Site: [place] INR POC 1.7  Dietary changes: no    Health status changes: yes       Details: rash on face  ? virus  Bleeding/hemorrhagic complications: no    Recent/future hospitalizations: no    Any changes in medication regimen? yes       Details: Started on Minocycline 119m bid on 12/01/10   Finishes 12/11/10  Recent/future dental: no  Any missed doses?: no       Is patient compliant with meds? yes       Allergies: 1)  ! Amoxicillin 2)  ! Sulfa  Anticoagulation Management History:      The patient is taking warfarin and comes in today for a routine follow up visit.  Positive risk factors for bleeding include an age of 89years or older and presence of serious comorbidities.  The bleeding index is 'intermediate risk'.  Positive CHADS2 values include History of HTN and History of Diabetes.  Negative CHADS2 values include Age > 79years old.  The start date was 12/23/2004.  Anticoagulation responsible provider: MDomenic PoliteMD, SMikeal Hawthorne  INR POC: 1.7.  Cuvette Lot#: [id number]  Exp: 10/11.    Anticoagulation Management Assessment/Plan:      The patient's current anticoagulation dose is Warfarin sodium 2.5 mg tabs: Use as directed by Anticoagualtion Clinic.  The target INR is 2 - 3.  The next INR is due 12/28/2010.  Anticoagulation instructions were given to patient.  Results were reviewed/authorized by LEdrick Oh RN.  He was notified by LEdrick OhRN.         Prior Anticoagulation Instructions: INR 2.0 Continue coumadin 2.58monce daily except 1.2568mn Wednesdays and Saturdays  Current Anticoagulation Instructions: INR 1.7 Increase coumadin to 2.5mg56mce daily

## 2010-12-28 ENCOUNTER — Encounter (INDEPENDENT_AMBULATORY_CARE_PROVIDER_SITE_OTHER): Payer: Medicare Other

## 2010-12-28 ENCOUNTER — Encounter: Payer: Self-pay | Admitting: Cardiovascular Disease

## 2010-12-28 DIAGNOSIS — I4891 Unspecified atrial fibrillation: Secondary | ICD-10-CM

## 2010-12-28 DIAGNOSIS — Z7901 Long term (current) use of anticoagulants: Secondary | ICD-10-CM

## 2011-01-03 NOTE — Medication Information (Signed)
Summary: ccr-lr  Anticoagulant Therapy  Managed by: Edrick Oh, RN PCP: Dr. Newt Minion Supervising MD: Johnsie Cancel MD, Collier [place]ina Indication 1: Atrial Fibrillation (ICD-427.31) Lab Used: [facility] [place] Site: [place] INR POC 1.9  Dietary changes: no    Health status changes: no    Bleeding/hemorrhagic complications: no    Recent/future hospitalizations: no    Any changes in medication regimen? no    Recent/future dental: no  Any missed doses?: no       Is patient compliant with meds? yes       Allergies: 1)  ! Amoxicillin 2)  ! Sulfa  Anticoagulation Management History:      The patient is taking warfarin and comes in today for a routine follow up visit.  Positive risk factors for bleeding include an age of 73 years or older and presence of serious comorbidities.  The bleeding index is 'intermediate risk'.  Positive CHADS2 values include History of HTN and History of Diabetes.  Negative CHADS2 values include Age > 73 years old.  The start date was 12/23/2004.  Anticoagulation responsible provider: Johnsie Cancel MD, Collier Salina.  INR POC: 1.9.  Cuvette Lot#: [id number].  Exp: 10/11.    Anticoagulation Management Assessment/Plan:      The patient's current anticoagulation dose is Warfarin sodium 2.5 mg tabs: Use as directed by Anticoagualtion Clinic.  The target INR is 2 - 3.  The next INR is due 01/23/2011.  Anticoagulation instructions were given to patient.  Results were reviewed/authorized by Edrick Oh, RN.  He was notified by Edrick Oh RN.         Prior Anticoagulation Instructions: INR 1.7 Increase coumadin to 2.43m once daily   Current Anticoagulation Instructions: INR 1.9 Take coumadin 1 1/2 tablets tonight then resume 1 tablet once daily

## 2011-01-16 ENCOUNTER — Other Ambulatory Visit: Payer: Self-pay | Admitting: Cardiology

## 2011-01-20 ENCOUNTER — Encounter: Payer: Self-pay | Admitting: Cardiology

## 2011-01-20 DIAGNOSIS — I4891 Unspecified atrial fibrillation: Secondary | ICD-10-CM

## 2011-01-20 DIAGNOSIS — Z7901 Long term (current) use of anticoagulants: Secondary | ICD-10-CM

## 2011-01-23 ENCOUNTER — Ambulatory Visit (INDEPENDENT_AMBULATORY_CARE_PROVIDER_SITE_OTHER): Payer: Medicare Other | Admitting: *Deleted

## 2011-01-23 DIAGNOSIS — I4891 Unspecified atrial fibrillation: Secondary | ICD-10-CM

## 2011-01-23 DIAGNOSIS — Z7901 Long term (current) use of anticoagulants: Secondary | ICD-10-CM

## 2011-02-13 ENCOUNTER — Ambulatory Visit (INDEPENDENT_AMBULATORY_CARE_PROVIDER_SITE_OTHER): Payer: Medicare Other | Admitting: *Deleted

## 2011-02-13 DIAGNOSIS — I4891 Unspecified atrial fibrillation: Secondary | ICD-10-CM

## 2011-02-13 DIAGNOSIS — Z7901 Long term (current) use of anticoagulants: Secondary | ICD-10-CM

## 2011-02-13 LAB — POCT INR: INR: 2.4

## 2011-03-07 NOTE — Letter (Signed)
October 28, 2008    Estill Bamberg. Karie Kirks, [address]  [place], [place] [address]   RE:  LARNELL, GRANLUND  MRN:  [id number]  /  DOB:  08-16-1936   Dear [name],   Mr. Stellmach returns to the office for continued assessment and treatment  of atrial fibrillation, coronary artery disease, and cardiovascular risk  factors.  Since the last visit, he has done superbly.  He has dieted and  lost weight.  He reports no dyspnea or chest discomfort.  He has chronic  pedal edema with stasis dermatitis that has improved under the care of a  dermatologist.   CURRENT MEDICATIONS:  1. Amlodipine 10 mg daily.  2. Hyzaar 50/25 mg daily.  3. Metoprolol 50 mg t.i.d.  4. Metformin 500 mg daily.  5. Warfarin as directed with stable and therapeutic anticoagulation.   PHYSICAL EXAMINATION:  GENERAL:  A pleasant gentleman in no acute  distress.  VITAL SIGNS:  The weight is 213 pounds, 22 pounds less than last year.  Blood pressure 120/70, heart rate 60 and irregular, and respirations 14.  NECK:  No jugular venous distention; normal carotid upstrokes without  bruits; status post neck dissection on the left.  LUNGS:  Clear.  CARDIAC:  Irregular rhythm; normal first and second heart sounds.  ABDOMEN:  Soft and nontender; no organomegaly.  EXTREMITIES:  Edema 2+ on the left; 1-2+ on the right; stasis dermatitis  on the left.  Distal pulses are intact.   Rhythm strip:  Atrial fibrillation with controlled ventricular response  and a heart rate of 64; occasional PVC.   IMPRESSION:  Mr. Mcconville is doing well overall.  His heart rate is well  controlled in atrial fibrillation.  He has no evidence for recurrent  myocardial ischemia.  He has been on a variety of lipid-lowering agents  in the past, but is unaware why any of them were discontinued.  Since he  has a low LDL and a low HDL, he would benefit from a drug that increases  the latter.  He will start gemfibrozil at a dose of 600 mg b.i.d. with a  lipid  profile and CBC to be obtained in 1 month.  Stool samples for  Hemoccult testing will be obtained due to chronic therapy with warfarin.  Hypertension is well controlled.  His amlodipine maybe contributing to  pedal edema.  At his next visit, we will consider modifying his  antihypertensive regimen to see if his edema can be improved.  He is  already following a low-salt diet and uses leg elevation as needed.  He  has tried compression stockings, but did not tolerate them.  We will  continue to adjust warfarin dosing in the Anticoagulation Clinic.    Sincerely,      Cristopher Estimable. Lattie Haw, MD, [facility]  Electronically Signed    RMR/MedQ  DD: 10/28/2008  DT: 10/29/2008  Job #: [id number]

## 2011-03-07 NOTE — Discharge Summary (Signed)
[name], [name]                ACCOUNT NO.:  [id number]   MEDICAL RECORD NO.:  [id number]          PATIENT TYPE:  INP   LOCATION:  A320                          FACILITY:  [facility]   PHYSICIAN:  Delphina Cahill, M.D.        DATE OF BIRTH:  1936/08/29   DATE OF ADMISSION:  05/20/2007  DATE OF DISCHARGE:  07/29/2008LH                               DISCHARGE SUMMARY   DISCHARGE DIAGNOSES:  1. Left lower leg cellulitis.  2. Left foot ulceration and callus  3. Diabetes mellitus, type 2.  4. Hypokalemia.   DISCHARGE MEDICATIONS:  The patient is discharged on:  1. Hyzaar 50/12.5 daily.  2. Metoprolol 50 mg 3 times a day.  3. Amlodipine 5 mg daily.  4. A daily vitamin once daily.  5. Metformin ER 500 mg p.o. daily.  6. Coumadin at 3 mg Monday, Wednesday, and Friday, 2 mg all other      days.  7. Levaquin 750 mg p.o. daily x6 more days.  8. Vicodin 5/500 p.o. q.6 h as needed for pain, #20 given.   BRIEF HISTORY OF PRESENT ILLNESS:  [name] is a 75 year old gentleman  who presented with significant erythema and pain in his left lower  extremity, admitted for cellulitis and further workup and to assure no  progression following use of antibiotics.   PHYSICAL EXAMINATION AT DISCHARGE:  VITAL SIGNS:  Temperature is 98.2,  blood pressure 124/67, pulse 77, respirations 20.  CBGs have been 109,  141, 140, and 105 since admission.  GENERAL:  An obese white male lying in bed in no acute distress.  HEENT:  Unremarkable.  LUNGS: Clear to auscultation bilaterally.  HEART:  Is slower, still occasional irregular beat, no murmurs  appreciated.  ABDOMEN:  Protuberant but soft, nontender, positive bowel sounds.  NEUROLOGIC:  Cranial nerves II-XII intact.  No deficits noted.  EXTREMITIES:  Still continues to have an erythematous, well-demarcated  rash on the lower extremity, still warm to touch but no vesicles or  pustules apparent at this time.  No specific source of this cellulitis,  but the patient  was marked yesterday and is not progressing further  since yesterday.   LABORATORY DATA:  Please see H&P for initial CBC, but CBC at time of  discharge:  White count 11.3, hemoglobin 11.4, platelet count of 301.  PT is 21.6, INR 1.8.  BMET:  Sodium 140, potassium 4.0, chloride 111,  CO2 24, glucose 112, BUN 15, creatinine 0.87, calcium of 7.8. Blood  cultures x2 were still pending.   HOSPITAL COURSE:  #1.  LEFT LOWER LEG CELLULITIS:  Was started on Levaquin 750 mg p.o.  daily, and has not had any further progression of this erythematous rash  on his left lower extremity. Will continue this for a full course of 7  days and reassess in approximately 5 days.  He is to alert if there is  any worsening erythema traveling up his leg. Will cover the pain with  Vicodin.   #2.  LEFT FOOT ULCERATION:  The patient sees Dr. [name] for this and  suggest  that the patient goes back to see him for callus removal.  He  does have some scant bleeding from this place but not erythema and does  not radiate from this position.  Do not think this the source of  infection.   #3.  DIABETES MELLITUS, TYPE 2:  Will resume all his medicines.  His  blood sugars remained good throughout hospitalization.   #4.  HYPOKALEMIA:  He was repleted, and at time of discharge, his  potassium is 4.0.   DISPOSITION:  The patient will be discharged today and follow up with in  my office in approximately 3 days for further evaluation of left leg.  If any changes, he is to come in earlier. He is to follow up with  [place] for his Coumadin level on Thursday and will adjust  appropriately, likely to increase secondary to the Levaquin.  The  patient will alert them of this.      Delphina Cahill, M.D.  Electronically Signed     ZH/MEDQ  D:  05/21/2007  T:  05/21/2007  Job:  [id number]   cc:   Estill Bamberg. Karie Kirks, M.D.  Fax: [phone]

## 2011-03-07 NOTE — Letter (Signed)
September 16, 2007    Estill Bamberg. Karie Kirks, M.D.  [address] [place], [place] [address]   RE:  [name], [name]  MRN:  [id number]  /  DOB:  1936-02-02   Dear [name]:   [name] returns to the office for continued assessment and treatment  of constant atrial fibrillation.  Until recently, he has been maintained  on warfarin in the context of a study.  That protocol has now ended, and  so he is returning to clinical status.  He has done extremely well of  late from a cardiac standpoint with no palpitations, no chest pain, and  no dyspnea.  He has had a cellulitis of the left leg that is responding  to antibiotics.  Blood pressure control and hyperlipidemia control have  been good.   CURRENT MEDICATIONS:  1. Amlodipine 5 mg daily.  2. Hyzaar 50/12.5 mg daily.  3. Metoprolol 100 mg t.i.d.  4. Metformin 500 mg q. a.m.  5. Doxycycline 100 mg b.i.d.  6. Warfarin.   Vaccinations are up to date.  He underwent colonoscopy a few months ago  with negative results.   EXAM:  A very pleasant gentleman in no acute distress.  The weight is 235, 1 pound less than last year.  Blood pressure 125/85,  heart rate 84 and irregular taken apically.  No jugular venous distension is noted.  Carotid upstrokes are normal  without bruits.  LUNGS:  Clear.  CARDIAC:  Irregular rhythm; normal first and second heart sounds;  minimal systolic ejection murmur.  ABDOMEN:  Soft and nontender; no organomegaly.  EXTREMITIES:  Marked erythema with swelling of the left lower extremity  below the mid shin.  A small area of skin avulsion where the patient  suffered trauma on top of his cellulitis.   IMPRESSION:  [name] is doing well overall.  He is responding to  antibiotics for cellulitis.  He is stable on warfarin therapy.  Stool  for Hemoccult testing will be obtained.  Recent lab, including a CBC,  was good.  We will reinstitute followup in [facility].  I will see  this nice gentleman again in 1  year.    Sincerely,      [name]. [name] Haw, MD, [facility]  Electronically Signed    RMR/MedQ  DD: 09/16/2007  DT: 09/16/2007  Job #: [id number]

## 2011-03-07 NOTE — H&P (Signed)
[name], [name]                ACCOUNT NO.:  [id number]   MEDICAL RECORD NO.:  [id number]          PATIENT TYPE:  INP   LOCATION:  A320                          FACILITY:  [facility]   PHYSICIAN:  Delphina Cahill, M.D.        DATE OF BIRTH:  Mar 05, 1936   DATE OF ADMISSION:  05/20/2007  DATE OF DISCHARGE:  LH                              HISTORY & PHYSICAL   PRIMARY CARE PHYSICIAN:  Estill Bamberg. Karie Kirks, M.D.   CHIEF COMPLAINT:  Left leg pain.   HISTORY OF PRESENT ILLNESS:  [name] is a 75 year old gentleman who  has past medical history significant for coronary artery disease, status  post cabbage, known atrial fibrillation, hypertension, dyslipidemia, and  diabetes who was seen in my office approximately 3 days ago very sick at  his stomach and having nausea, vomiting, diarrhea.  Felt initially be  gastroenteritis at that time.  The symptoms improved but the next day he  started developing a rash to his left lower leg.  This progressed to  become more red and traveled up his leg and became more painful.  He  came into the emergency department for further evaluation.  The ER  physician assessed and found to have white count and felt to be  cellulitis in his lower extremity.  He states that he has had something  similar to this except it was much more dark before in 2006.  He also  notes that he has had a callus and ulceration on his lower feet which he  does have at this time as well.   PAST MEDICAL HISTORY:  1. As mentioned above, coronary disease, status post CABG in the      1990s.  2. Squamous cell carcinoma to his left jaw in the 1990s.  3. Known atrial fibrillation.  4. Hypertension.  5. Dyslipidemia.  6. Diabetes mellitus type 2.  7. History of fractures left tibia  8. History of tonsillectomy.   ALLERGIES:  I believe AMOXICILLIN and SULFA, although sulfa is not  mentioned in the computer.   SOCIAL HISTORY:  Denies any smoking or drinking.   FAMILY HISTORY:  Mother had  heart disease.  Unknown father's history.   REVIEW OF SYSTEMS:  Other than pain in his lower leg, he denies any  nausea and vomiting.  No further diarrhea.  No problems with abdominal  pains, chest pains and shortness of breath.  Does have occasional sharp  shooting pain in the left lower extremity.   MEDICATIONS:  1. Hyzaar 50/12.5 p.o. daily.  2. Metoprolol 50 mg t.i.d.  3. Amlodipine 5 mg p.o. daily.  4. Vitamins once daily.  5. Metformin ER 500 mg p.o. daily.  6. Coumadin 3 mg Monday, Wednesday and Friday and 2 mg all other days.  7. Silver Sulfadiazine 1% cream applied to foot daily.   OBJECTIVE:  VITAL SIGNS:  Temperature is 98.5, blood pressure 142/84,  pulse is 109, respirations 20, 98% on room air.  GENERAL:  This is an obese white male lying in bed in no acute distress.  HEENT:  Unremarkable.  Pupils equal and react to light and  accommodation.  NECK:  Supple.  No JVD.  No thyromegaly.  No carotid bruits.  CHEST:  Good air movement throughout.  Clear to auscultation  bilaterally.  HEART:  Irregularly irregular rhythm.  No murmurs appreciated.  ABDOMEN:  Abdomen is protuberant, soft, nontender.  Positive bowel  sounds.  EXTREMITIES:  Left leg is mildly more swollen than the right leg.  He  has a well-demarcated, erythematous rash from about the  level of his  ankle up to almost his knee, definite warmth to this, a few small that  papules but definitely has raised.  The convalescence of this rash with  large patches traveled up his leg.  No significant purulence noted.  He  also has significant open callus wound on the lateral ball of his foot  which has some scant bleeding, but no significant pustular drainage of  this.  No fluctuance noted.   LABORATORY DATA:  Initial white count is 11.7, hemoglobin 13.3, platelet  count of 335,000.  BMET shows sodium 137, potassium 3.3, chloride 105,  CO2 21, glucose 130, BUN 17, creatinine 1.02, calcium of 8.  PT was  19.7, INR of  1.6, hemoglobin A1C of 6.5. Blood cultures x2 were drawn.   IMPRESSION:  This is a 75 year old gentleman with what appears to be a  left lower leg cellulitis.   ASSESSMENT/PLAN:  1. Left leg cellulitis.  Unknown specific source.  He does have      ulceration on the bottom of his foot but did not have significant      erythema and radiation in his foot.  He states that he did have      some medial side of his left leg which was hurting for last couple      days, before this rash appeared.  Will start on broad-spectrum      Levaquin, high-dose, and IV fluids.  He denies any other symptoms      related this time.  2. Nausea, vomiting, diarrhea.  This all resolved.  Felt that this may      be initially gastroenteritis, but given his bout of cellulitis, it      is very likely this could have caused his symptoms.  3. Chronic Coumadin.  He is subtherapeutic on his Coumadin, but since      he is going to be on Levaquin, we will continue to monitor closely      if this will raise his Coumadin level.  4. Coronary artery disease, status post coronary artery bypass      grafting.  Will continue to monitor and continue on all previous      medicines as previous prescribed.  5. Diabetes mellitus type 2.  Will check a hemoglobin A1C which shows      that he is doing relatively well with his diet and will cover with      sliding scale insulin at this time and once feel close to      discharge, will resume his metformin.  6. Mild hypokalemia.  Will hold his hydrochlorothiazide for now and      replete his IV fluids.   DISPOSITION:  The patient will be admitted and observed as far as this  rash for cellulitis, any progression.  This was marked to see if any  progression occurs.      Delphina Cahill, M.D.  Electronically Signed     ZH/MEDQ  D:  05/21/2007  T:  05/21/2007  Job:  [id number]

## 2011-03-10 NOTE — Group Therapy Note (Signed)
NAMEARVEL, OQUINN                ACCOUNT NO.:  [id number]   MEDICAL RECORD NO.:  [id number]          PATIENT TYPE:  INP   LOCATION:  A309                          FACILITY:  [facility]   PHYSICIAN:  Angus G. McInnis, MD   DATE OF BIRTH:  1936/04/22   DATE OF PROCEDURE:  03/25/2005  DATE OF DISCHARGE:                                   PROGRESS NOTE   SUBJECTIVE:  This patient was admitted with cellulitis of left foot,  callused area that seems to be infected on the plantar surface of left foot,  he does have a history of coronary artery disease, new onset diabetes,  hypertension by history, history of atrial fibrillation, currently is on IV  antibiotics.   OBJECTIVE:  VITAL SIGNS:  Blood pressure 132/77, respirations 20, pulse 98,  temperature 98.5.  LUNGS:  Clear to P&A.  HEART:  Regular rhythm.  ABDOMEN:  No palpable organs or masses.   ASSESSMENT:  Patient does have cardiac irregularity compatible with atrial  fibrillation.   PLAN:  Continue current IV antibiotics.  Patient is being seen by Dr. Tamala Julian  as well.      AGM/MEDQ  D:  03/25/2005  T:  03/25/2005  Job:  [id number]

## 2011-03-10 NOTE — Group Therapy Note (Signed)
NAMEROURKE, MCQUITTY                ACCOUNT NO.:  [id number]   MEDICAL RECORD NO.:  [id number]          PATIENT TYPE:  INP   LOCATION:  A309                          FACILITY:  [facility]   PHYSICIAN:  Angus G. Everette Rank, MD   DATE OF BIRTH:  06-Dec-1935   DATE OF PROCEDURE:  03/26/2005  DATE OF DISCHARGE:                                   PROGRESS NOTE   SUBJECTIVE:  This patient was admitted with cellulitis of his left foot and  callused area that seems to be infected on the plantar surface of his left  foot.  He remains on IV antibiotics.  He has low-grade fever.   OBJECTIVE:  VITAL SIGNS:  Blood pressure 125/75, respirations 20, pulse 94,  temperature 98.2, blood sugars have ranged from 167 to 233.  LUNGS:  Clear to P&A.  HEART:  Regular rhythm.  ABDOMEN:  No palpable organs or masses.  EXTREMITIES:  Patient has cellulitis of the left foot and infected callus on  the surface of the left foot.   ASSESSMENT AND PLAN:  Plan to continue current intravenous antibiotics.  We  will obtain surgical consult.      AGM/MEDQ  D:  03/26/2005  T:  03/26/2005  Job:  [id number]

## 2011-03-10 NOTE — Group Therapy Note (Signed)
[name], [name]                ACCOUNT NO.:  [id number]   MEDICAL RECORD NO.:  [id number]          PATIENT TYPE:  INP   LOCATION:  A309                          FACILITY:  [facility]   PHYSICIAN:  Angus G. McInnis, MD   DATE OF BIRTH:  July 05, 1936   DATE OF PROCEDURE:  DATE OF DISCHARGE:                                   PROGRESS NOTE   SUBJECTIVE:  This patient was admitted with cellulitis of his left foot and  calloused area that seemed to be infected on the plantar surface of his left  foot.  He remains on IV antibiotics, has improved.   OBJECTIVE:  VITAL SIGNS:  Blood pressure 136/76, respirations 20, pulse 100,  temperature 98.4.  Blood sugars have ranged from 156-233.  LUNGS:  Clear to P&A.  HEART:  Regular rate and rhythm.  ABDOMEN:  No palpable organs or masses.  EXTREMITIES:  Left foot inflamed on dorsum of foot.  The patient has  calloused area on the plantar surface of his foot which is draining.   ASSESSMENT:  The patient was admitted with cellulitis of his left foot,  callous which was infected on the plantar surface of his foot.  He does have  new onset diabetes.   PLAN:  Continue current IV antibiotics.  Will start Glucophage XR 500 mg  daily.  Continue current regimen.      AGM/MEDQ  D:  03/27/2005  T:  03/27/2005  Job:  [id number]

## 2011-03-10 NOTE — H&P (Signed)
NAME:  [name], [name]                ACCOUNT NO.:  [id number]   MEDICAL RECORD NO.:  [id number]           PATIENT TYPE:  AMB   LOCATION:  DAY                           FACILITY:  [facility]   PHYSICIAN:  Jamesetta So, M.D.  DATE OF BIRTH:  07/08/1936   DATE OF ADMISSION:  11/20/2006  DATE OF DISCHARGE:  LH                              HISTORY & PHYSICAL   CHIEF COMPLAINT:  Need for screening colonoscopy.   HISTORY OF PRESENT ILLNESS:  The patient is a 75 year old white male who  is referred for endoscopic evaluation.  He needs colonoscopy for  screening purposes.  No abdominal pain, weight loss, nausea, vomiting,  diarrhea, constipation, melena, or hematochezia have been noted.  He has  never had a colonoscopy.  There is no family history of colon carcinoma.   PAST MEDICAL HISTORY:  1. Coronary artery disease.  2. Hypertension.  3. chronic atrial fibrillation.   PAST SURGICAL HISTORY:  CABG in the remote past.   CURRENT MEDICATIONS:  Hyzaar, amlodipine, metoprolol, metformin,  simvastatin, Tricor, Coumadin.   ALLERGIES:  NO KNOWN DRUG ALLERGIES.   REVIEW OF SYSTEMS:  Noncontributory.   PHYSICAL EXAMINATION:  GENERAL:  The patient is a well-developed, well-  nourished white male in no acute distress.  LUNGS:  Clear to auscultation with equal breath sounds bilaterally.  HEART:  Examination reveals an irregularly irregular rhythm without S3,  S4, or murmurs.  ABDOMEN:  Soft, nontender, and nondistended.  No hepatosplenomegaly or  masses are noted.  RECTAL:  Examination was deferred to the procedure.   IMPRESSION:  Need for screening colonoscopy.   PLAN:  The patient is scheduled for colonoscopy on November 20, 2006.  The risks and benefits of the procedure including bleeding and  perforation were fully explained to the patient, who gave informed  consent.  The patient is to stop his Coumadin on November 16, 2006.      Jamesetta So, M.D.  Electronically Signed     MAJ/MEDQ  D:  11/07/2006  T:  11/08/2006  Job:  [id number]   cc:   Estill Bamberg. Karie Kirks, M.D.  Fax: [phone]

## 2011-03-10 NOTE — Discharge Summary (Signed)
[name], [name]                ACCOUNT NO.:  [id number]   MEDICAL RECORD NO.:  [id number]          PATIENT TYPE:  INP   LOCATION:  A303                          FACILITY:  [facility]   PHYSICIAN:  Vernon Prey. Tamala Julian, M.D.   DATE OF BIRTH:  September 19, 1936   DATE OF ADMISSION:  06/13/2005  DATE OF DISCHARGE:  08/28/2006LH                                 DISCHARGE SUMMARY   DISCHARGE DIAGNOSIS:  1.  Abscess, left foot, with osteomyelitis.  2.  Chronic plantar callous in the fifth toe.  3.  Diabetes mellitus.  4.  Hypertension.  5.  Atherosclerotic heart disease.  6.  Peripheral neuropathy.  7.  Osteoarthritis.   SPECIAL PROCEDURES:  Wide excision of abscess of left foot and left fifth  toe metatarsal head August 25.   DISPOSITION:  The patient discharged home in stable satisfactory condition.   DISCHARGE MEDICATIONS:  1.  Cleocin 600 mg four times daily.  2.  Erythromycin 500 mg 4 times daily.  3.  Lortab 5/500 times 4 daily as needed.  4.  Norvasc 5 mg daily.  5.  Metoprolol 50 mg in the morning and 50 mg in the evening.  6.  Metformin 500 mg daily.  7.  Centrum Silver 1 daily.  8.  Hydroxyzine 25 mg 4 times daily.   He is to continue the Coumadin and Hyzaar as he took before admission. He  will have physical therapy with pulse lavage and hydrogel dressing changes  daily and physical therapy.   SUMMARY:  A 75 year old male with history of recurrent infected callus of  left foot, abscess and cellulitis. Presented to the office with extensive  cellulitis of the dorsum of the left foot. He had a callus of the plantar  aspect of the left fifth toe that was previously infected in June. He was  treated with antibiotics successfully at that time. The patient was seen and  was admitted to treatment. He was afebrile. Vital signs were stable. Exam  revealed a big callus on the metatarsal head of the left fifth toe along the  plantar aspect with a bulging ____________ abscess along the lateral  aspect  of the fifth toe and cellulitis extending above the fifth toe across the  dorsum of the foot to the third toe and about mid distance between the toes  and ankle. There was ascending lymphangitis. There was 1+ edema of the foot.  Pulses were intact.   LABORATORY DATA:  White count 11,400, hemoglobin 13, hematocrit 38.8. Sed  rate was 37. PT 16.5. Wound cultures:  Abundant group B strep. X-ray  revealed osteomyelitis of the distal fifth metatarsal with healing of a  nondisplaced pathologic fracture of the distal fifth metatarsal. EKG showed  atrial fibrillation.   HOSPITAL COURSE:  The patient was admitted and started initially on IV  Cleocin and doxycycline. He had local wound care. He made some initial  improvement. It was decided to do local debridement with wide excision on  August 25. This was accomplished without difficulty. After debridement, he  made a much better response, and inflammation decreased  and the edema  decreased. By the 27th, he was doing well with significant drainage.  Cellulitis was improved, and edema was resolved. He was stable on the 28th  and was finally discharged home. The patient did not have any difficulty  with diabetes during this hospitalization. His blood pressure was well-  controlled, and he had no difficulty with atrial fibrillation. He was  discharged home in satisfactory condition.      Vernon Prey. Tamala Julian, M.D.  Electronically Signed     LCS/MEDQ  D:  08/13/2005  T:  08/14/2005  Job:  [id number]

## 2011-03-10 NOTE — Discharge Summary (Signed)
[place]. [facility]  Patient:    [name], [name]                        MRN: [id number] Adm. Date:  03/31/01 Disc. Date: 04/02/01 Attending:  Cristopher Estimable. Lattie Haw, M.D. [facility] Dictator:   Erlene Quan, P.A.C. [facility] CC:         Leslie Andrea, M.D., [address], [place],   [address]   Referring Physician Discharge [place]  DISCHARGE DIAGNOSES: 1. Chest pain, myocardial infarction ruled out this admission. 2. Coronary disease, status post coronary artery bypass grafting in 1995. 3. Hypertension, medications adjusted this admission. 4. Elevated lipids and triglycerides, Lopid added this admission. 5. History of squamous cell carcinoma, status post jaw resection in 1993. 6. Recent urinary tract infection and urology evaluation.  HOSPITAL COURSE:  Patient is a 75 year old retired male who had bypass surgery by Dr. Redmond Pulling in 1995.  He has been seen in the past by Dr. Cristopher Estimable. [place] and Dr. Leslie Andrea is his primary care doctor. He was admitted March 31, 2001 with shortness of breath and chest pain.  Some EKG changes were worrisome for unstable angina.  He was admitted to telemetry. Echocardiogram and CK-MBs and troponins were obtained.  Enzymes were negative and his heparin was stopped on the 10th.  His medications were adjusted for blood pressure.  Echocardiogram was obtained which revealed a mildly dilated LV with mild LV dysfunction an EF of 40-50%.  Dr. Lattie Haw feels he can be discharged April 02, 2001.  He suggested a followup stress test as an outpatient; he will also need a BMP.  DISCHARGE MEDICATIONS: 1. Lopid 600 mg b.i.d. 2. Hydrochlorothiazide 12.5 mg q.d. 3. Cozaar 100 mg a day. 4. Coated aspirin q.d. 5. Lopressor 25 mg b.i.d.  LABORATORY DATA:  CK-MB and tropinins are negative.  WBC is 10.8, hemoglobin 14.5, hematocrit of 41.9 and platelets 326,000.  PCO2 35, PO2 104, pH 7.41. D-dimer is 0.29.  Cholesterol is 159, HDL 35, LDL 68.  TSH  2.95.  BMP shows a sodium of 133, potassium 4.1, BUN 114, creatinine 1.1.  Chest x-ray:  Cardiomegaly.  No active disease.  DISPOSITION:  Patient is discharged in stable condition.  FOLLOWUP:  He needs to call the office and Webb Silversmith will arrange stress test and followup with Dr. Lattie Haw with a BMP.  I have instructed him to hold this Lopressor the evening before and the morning of his stress test. DD:  04/02/01 TD:  04/02/01 Job: [id number]

## 2011-03-10 NOTE — Discharge Summary (Signed)
[place]. [facility]  Patient:    [name], [name]                       MRN: [id number] Adm. Date:  [id number] Disc. Date: 04/17/01 Attending:  Garth Bigness Dictator:   Richardson Dopp, P.A. CC:         Leslie Andrea, M.D.   Discharge Summary  DATE OF BIRTH:  02-10-2036  DISCHARGE DIAGNOSES: 1. Paroxysmal atrial fibrillation with rapid ventricular rate occurring during    treadmill Cardiolite test as an outpatient.    a. Patient converted to normal sinus rhythm prior to discharge. 2. Chest pain prompting outpatient Cardiolite noted above. 3. Hyperlipidemia. 4. Hypertension. 5. Coronary artery disease, status post coronary artery bypass grafting in    1995 (grafts were left internal mammary artery to left anterior descending,    saphenous vein graft to intermediate and obtuse marginal, and saphenous    vein graft to right coronary artery). 6. History of jaw cancer, status post resection. 7. Nonsustained ventricular tachycardia x 1 this admission.  PROCEDURES PERFORMED THIS ADMISSION:  Cardiac catheterization by Loretha Brasil. Lia Foyer, M.D., on April 16, 2001, revealing patent grafts, distal RCA disease, and EF 75%.  Medical management recommended.  HISTORY OF PRESENT ILLNESS:  [name] was seen as an outpatient on April 15, 2001, for GXT Cardiolite.  During the Cardiolite, the patient developed rapid atrial fibrillation unresponsive to IV Lopressor.  It was decided to transfer the patient to [facility] as the patient may need DC cardioversion.  HOSPITAL COURSE:  He subsequently ruled out for MI by enzymes x 3.  It was felt necessary that the patient needed cardiac catheterization to evaluate his symptoms.  The case was discussed with Marcello Moores C. Wall, M.D., who knows the patient well.  The patient went for cardiac catheterization on April 16, 2001. The results were noted above.  The patient tolerated the procedure well  and had no immediate complications.  On the morning of April 17, 2001, the patient had an eight-beat run of nonsustained ventricular tachycardia that was asymptomatic.  The patients potassium was checked and this was normal.  It was noted that the patient was on Lopid and had never been tried on statin. Therefore, his lipid was discontinued and Lipitor was started.  His blood pressure was still 150/70 and Norvasc was added to his regimen.  During the patients hospital stay, his Lopressor was increased to 50 mg twice a day to help with better rate control.  The patient will need follow-up liver function tests and lipid profile in six weeks.  It was decided to evaluate the patient with 30-day monitor to see if he has any recurrent atrial fibrillation. Signa Kell, M.D., saw the patient on April 17, 2001, and noted that the patient would need Coumadin if he has recurrent atrial fibrillation. Therefore, it was felt that the patient was stable enough for discharge to home.  LABORATORY DATA:  White blood cell count 7500, hemoglobin 13.6, hematocrit 39.5, platelet count 429,000.  INR 1.2.  Sodium 135, potassium 4.7, chloride 102, CO2 24, glucose 148, BUN 25, creatinine 1.2, alkaline phosphatase 85, SGOT 18, SGPT 20, total protein 7.4, albumin 3.5.  Please note that the glucose level noted above was after eating.  Cardiac enzymes negative x 3.  DISCHARGE MEDICATIONS: 1. Aspirin 325 mg q.d. 2. Cozaar 50 mg b.i.d. 3. Lopressor 50 mg b.i.d. 4. HCTZ 12.5 mg q.d.  5. Multivitamins q.d. 6. Norvasc 5 mg q.d. 7. Nitroglycerin p.r.n. chest pain. 8. Lipitor 10 mg q.h.s.  SPECIAL INSTRUCTIONS:  The patient was advised to stop taking his Lopid.  He has been alerted that his Lopressor was increased.  ACTIVITY:  No driving, heavy lifting, or exertion for three days.  DIET:  Low-fat, low-sodium diet.  WOUND CARE:  The patient is to call for increased swelling, bleeding,  or bruising.  FOLLOW-UP:  He will need a lipid profile and LFTs checked in six weeks.  He is to go by the [place], [facility], office on Wednesday, April 24, 2001, at 11 a.m. for placement of a 30-day heart monitor.  He will see Dr. Marcello Moores C. [place] physician assistant on Thursday, May 02, 2001, at 10 a.m. and Leslie Andrea, M.D.  At his follow-up appointment on May 02, 2001, another appointment will be scheduled to follow-up on his heart monitor.  As noted above, if he has recurrent atrial fibrillation, he will need Coumadin. DD:  04/17/01 TD:  04/17/01 Job: [id number]

## 2011-03-10 NOTE — Group Therapy Note (Signed)
NAME:  [name], [name]               ACCOUNT NO.:  [id number]   MEDICAL RECORD NO.:  [id number]          PATIENT TYPE:  INP   LOCATION:  A309                          FACILITY:  [facility]   PHYSICIAN:  Angus G. Everette Rank, MD   DATE OF BIRTH:  01-08-1936   DATE OF PROCEDURE:  03/28/2005  DATE OF DISCHARGE:                                   PROGRESS NOTE   SUBJECTIVE:  This patient was admitted with cellulitis of his left foot and  infected callous on the plantar surface of his left foot.  He remains  afebrile on antibiotics.  His sugars have ranged from 132-192.   OBJECTIVE:  VITAL SIGNS:  Blood pressure 132/73, respirations 20, pulse 99,  temperature 98.1.  LUNGS:  Clear to P&A.  HEART:  Regular rhythm.  ABDOMEN:  No palpable organs or masses.   IMPRESSION:  The patient has an inflamed dorsum of the left foot, but  improved, and callous on the plantar surface of his left foot.   PLAN:  Continue current regimen.  For history of atrial fibrillation, will  restart Coumadin therapy.      AGM/MEDQ  D:  03/28/2005  T:  03/28/2005  Job:  [id number]

## 2011-03-10 NOTE — H&P (Signed)
[name], [name]                ACCOUNT NO.:  [id number]   MEDICAL RECORD NO.:  [id number]          PATIENT TYPE:  INP   LOCATION:  A203                          FACILITY:  [facility]   PHYSICIAN:  Vernon Prey. Tamala Julian, M.D.   DATE OF BIRTH:  18-Mar-1936   DATE OF ADMISSION:  06/13/2005  DATE OF DISCHARGE:  LH                                HISTORY & PHYSICAL   A 75 year old male with a history of recurrent infected callus of his left  foot with abscess and cellulitis.  The patient presented to the office with  extensive cellulitis of the dorsum of the left foot.  He has a plantar  callus that was previously infected back in early June.  It was treated with  antibiotics successfully at that time.  He states that his foot is not as  painful as it was previously.  He is admitted for IV antibiotics and for  scheduled wide excision of the area with excision of the callus.   PAST HISTORY:  1.  Diabetes mellitus.  2.  Hypertension.  3.  Atherosclerotic heart disease, status post coronary artery bypass graft.  4.  __________  neuropathy.  5.  Osteoarthritis.   MEDICATIONS:  1.  Hyzaar, dose unknown.  2.  Norvasc 5 mg daily.  3.  Metoprolol 100 mg, one half tab in the morning and one half in the      evening.  4.  Coumadin 2 mg daily.  5.  Metformin 500 mg daily.  6.  Centrum Silver one daily.  7.  Hydroxyzine 25 mg four times daily.   ALLERGIES:  AMOXICILLIN.   SURGERY:  Status post left jaw resection in 1993 for squamous cell  carcinoma.   PHYSICAL EXAMINATION:  VITAL SIGNS:  Blood pressure 142/69, pulse 80,  respirations 20, weight 204 pounds.  HEENT:  Unremarkable.  NECK:  Supple.  No JVD, bruit, adenopathy, or thyromegaly.  CHEST:  Clear to auscultation.  HEART:  Regular rate and rhythm without murmur, gallop or rub.  ABDOMEN:  Soft, nontender.  No masses.  EXTREMITIES:  Big callus on the metatarsal head of the left fifth toe along  the plantar aspect with a bulging developing  abscess along the lateral  aspect of the fifth toe and cellulitis extending above the fifth toe across  the dorsum of the foot to the about the third toe and about mid distance  between the toes and the ankle.  There is no ascending lymphangitis or  cellulitis.  There is 1+ edema of the foot.  No edema of the leg.  Pulses  are intact.  NEUROLOGIC:  No focal motor, sensory, or cerebellar deficit.   IMPRESSION:  1.  Recurrent abscess left foot with secondary cellulitis.  2.  Diabetes mellitus.  3.  Hypertension.  4.  Atherosclerotic heart disease.  5.  Peripheral neuropathy.  6.  Osteoarthritis.   PLAN:  1.  The patient will receive doxycycline and Cleocin IV.  If he does not      respond we will add vancomycin.  2.  We will try to  reduce the amount of cellulitis and we will have      scheduled wide excision of the area with debridement of the area of      necrosis.      Vernon Prey. Tamala Julian, M.D.  Electronically Signed     LCS/MEDQ  D:  06/14/2005  T:  06/14/2005  Job:  [id number]

## 2011-03-10 NOTE — Letter (Signed)
November 05, 2006    Leslie Andrea, MD  [address]  [place], [place]   RE:  ALANMICHAEL, BARMORE  MRN:  [id number]  /  DOB:  01-23-1936   Dear [name]:   Mr. Iovino returns to the office for continued assessment and treatment  of atrial fibrillation, coronary disease, hypertension, and  hyperlipidemia.  Since I last saw him, he underwent a stress Myoview  study.  The results were good with a mild left ventricular dilation,  preserved left ventricular systolic function, no ischemia, and an  equivocal area of artifact versus anteroapical scarring.  Since I last  saw him, Mr. Brooks has felt well.  He has also been seen by you and  simvastatin was added to his lipid-lowering regime, which includes  fenofibrate 145 mg daily.  A lipid profile is anticipated in 1 month.   On exam, a pleasant gentleman in no acute distress.  The weight is 236,  seven pounds more than 2 weeks ago.  Blood pressure 130/80.  Heart rate 84 and irregular.  Respirations 16.  NECK:  No jugular venous distention; normal carotid upstrokes without  bruits.  CARDIAC:  Normal first and second heart sounds; minimal systolic  ejection murmur.  ABDOMEN:  Soft and nontender; no organomegaly.  EXTREMITIES:  1+ edema.   IMPRESSION:  Mr. Brummet is doing generally well.  His stress nuclear  study is reassuring.  Lipid-lowering therapy is likely adequate.  Assuming his laboratory result is good in 1 month, I will plan to see  him again in 1 year.  Vaccinations are up to date.    Sincerely,      Cristopher Estimable. Lattie Haw, MD, [facility]  Electronically Signed    RMR/MedQ  DD: 11/05/2006  DT: 11/05/2006  Job #: [id number]

## 2011-03-10 NOTE — Cardiovascular Report (Signed)
[place]  Patient:    [name], [name]                       MRN: [id number] Proc. Date: 04/16/01 Adm. Date:  [id number] Attending:  Garth Bigness CC:         Leslie Andrea, M.D.  Cristopher Estimable. Lattie Haw, M.D. [facility]  Marcello Moores C. Wall, M.D. [facility]  CV Laboratory   Cardiac Catheterization  INDICATIONS:  The patient is a pleasant, 75 year old male, who presents with recurrence of atrial fibrillation.  He was transferred down for further evaluation.  He has not been having recurrent chest discomfort.  He was ruled out for MI.  He developed rapid atrial fibrillation and responded with IV Lopressor.  He was transferred to [facility] for further evaluation to include cardiac catheterization.  He then converted back to sinus rhythm.  The current study was done to assess coronary anatomy.  PROCEDURES: 1. Left heart catheterization. 2. Selective coronary arteriography. 3. Selective left ventriculography. 4. Saphenous vein graft angiography x2. 5. Selective left internal mammary angiography. 6. Selective left ventriculography.  DESCRIPTION OF PROCEDURE:  The procedure was performed from the right femoral artery using 6 French catheters.  He tolerated the procedure without complication.  HEMODYNAMICS:  The central aortic pressure was 177/91. LV pressure 167/8. There was no gradient on pullback across the aortic valve.  ANGIOGRAPHIC DATA: 1. The left main coronary artery demonstrates about 30% narrowing at the    distal most aspect. 2. The left anterior descending artery is totally occluded after as    small diagonal and septal perforator. 3. The circumflex marginal is totally occluded in its midportion.  The origin    of the intermediate cannot be seen. 4. The right coronary artery is totally occluded proximally. 5. The internal mammary to the LAD appears to be widely patent. 6. The saphenous vein graft sequentially to the intermediate and OM  also    appears to be widely patent without significant focal narrowing. 7. The saphenous vein graft to the distal right coronary artery demonstrates    a widely patent graft inserting in the distal RCA.  The distal RCA is    diffusely diseased.  There are tandem lesions of 80% in the distal vessel.    The first one overlaps the first posterolateral branch.  The second one    overlaps the second posterolateral branch which itself has a 90%    stenosis.  Distally, there is some luminal irregularity in the third    posterolateral system.  Ejection fraction is greater than 75%.  No segmental wall motion abnormalities are noted.  The ejection fraction was calculated in the post PVC beat.  CONCLUSIONS: 1. Continued patency of the internal mammary to the left anterior descending. 2. Continued patency of the saphenous vein graft sequentially to the    intermediate and obtuse marginal. 3. Continued patency of saphenous vein graft to the distal right coronary    artery with diffuse distal right coronary artery disease.  DISPOSITION:  The right could be dilated, however, one branch is just distal to the first stenosis and the other branch overlies the second stenosis and itself is subtotally occluded.  There is likelihood of side branch occlusion with dilatation of both of these lesions.  This was salvaged one moderate sized posterolateral branch but would potentially sacrifice two smaller vessels, neither of which is ideal for percutaneous intervention.  Plan to treat the patient medically.  Dr. Verl Blalock and Dr. Lattie Haw will need to decide if in fact they will plan what management for the atrial fibrillation. DD:  04/16/01 TD:  04/17/01 Job: [id number]

## 2011-03-10 NOTE — Group Therapy Note (Signed)
NAME:  [name], [name]               ACCOUNT NO.:  [id number]   MEDICAL RECORD NO.:  [id number]           PATIENT TYPE:  INP   LOCATION:  A312                          FACILITY:  [facility]   PHYSICIAN:  Angus G. Everette Rank, MD   DATE OF BIRTH:  [birth year]   DATE OF PROCEDURE:  DATE OF DISCHARGE:                                   PROGRESS NOTE   SUBJECTIVE:  This patient was admitted with cellulitis of his left foot and  infected callus on the plantar surface of his left foot.  He remains  afebrile and appears to be improved.  His sugar is more well controlled.  Sugars have run from 147-173.   OBJECTIVE:  VITAL SIGNS:  Blood pressure 135/60, respirations 20, pulse 91,  temperature 98.1.  LUNGS:  Clear to P&A.  HEART:  Irregular rhythm.  ABDOMEN:  No palpable organs or masses.  The patient has some slight  tenderness of the dorsum of the left foot and callus on the plantar surface  of the left foot.   ASSESSMENT:  1.  The patient has inflamed dorsum of the left foot, but improved.  Has      callus on the plantar surface of the left foot which is draining.  2.  New-onset diabetes.  3.  History of atrial fibrillation.   PLAN:  Continue current regimen but would obtain prothrombin time today and  continue Coumadin dosage.      AGM/MEDQ  D:  03/29/2005  T:  03/29/2005  Job:  [id number]

## 2011-03-10 NOTE — Letter (Signed)
October 24, 2006    Estill Bamberg. Karie Kirks, M.D.  [address]  [place], [place] [address]   RE:  ZORAWAR, STROLLO  MRN:  [id number]  /  DOB:  02/26/1936   Dear [name]:   Mr. Christiana came to the office today to report an episode of chest  symptoms.  As you known, he has a history of remote bypass surgery with  catheterization in 2002 revealing patent grafts.  On Sunday evening, he  noted pulling sensation in his chest.  This was most notable with deep  inspiration and expiration.  There was no chest wall tenderness.  There  was no dyspnea nor associated symptoms.  There was no radiation.  This  was not really painful.  In fact, he slept through the night but then  noted some continuing sensations in the morning that were episodic.  These have gradually faded and virtually resolved by this point.   Otherwise, he has done generally well.  He previously was taking a  number of lipid lowering agents but these have been lost over the years.  He was suppose to see me approximately a year ago, but no appointment  was made for him.  He has good exercise tolerance with essentially no  other cardiopulmonary symptoms.   CURRENT MEDICATIONS:  1. Norvasc 5 mg daily.  2. Hyzaar 50/12.5 mg daily.  3. Metoprolol 50 mg b.i.d.  4. Metformin 500 mg daily.  5. Warfarin being administered per protocol in a research project.   PHYSICAL EXAMINATION:  GENERAL APPEARANCE:  A pleasant gentleman in no  acute distress.  VITAL SIGNS:  Weight is 229, blood pressure 140/80, heart rate 94 and  irregular, respirations 16.  NECK:  No jugular venous distension; normal carotid upstrokes without  bruits; surgical scar over the left neck related to previous excision of  neoplastic disease.  LUNGS:  Somewhat decreased breath sounds on the right; no wheezing,  rhonchi or rales.  CARDIOVASCULAR:  Irregular, somewhat rapid rhythm; no murmur nor gallop  appreciated.  ABDOMEN:  Soft and nontender; no organomegaly.  EXTREMITIES:  No edema; distal pulses intact.   EKG:  Atrial fibrillation; delayed R-wave progression - could not  exclude previous anteroseptal MI; left anterior fascicular block.   IMPRESSION:  Mr. Paulos presents with atypical symptoms that may not  represent any significant pathologic process.  Since coronary disease  has not been evaluated in six years, we will proceed with a stress  nuclear study.  Due to a relatively rapid heart rate, his dose of  metoprolol will be increased to 50 mg t.i.d.  His last lipid profile in  2006, showed very low total cholesterol but a very low HDL as well.  Fenofibrate 145 mg daily will be added to his regime.  Lipids will be  reassessed in one month.  I will see this nice gentleman again after his  stress test has been completed.  He is scheduled to see you later this  week for an annual visit.    Sincerely,      Cristopher Estimable. Lattie Haw, MD, [facility]  Electronically Signed    RMR/MedQ  DD: 10/24/2006  DT: 10/24/2006  Job #: [id number]

## 2011-03-10 NOTE — Op Note (Signed)
NAMEJERED, HEINY                ACCOUNT NO.:  [id number]   MEDICAL RECORD NO.:  [id number]          PATIENT TYPE:  INP   LOCATION:  A325                          FACILITY:  [facility]   PHYSICIAN:  Leane Para C. Tamala Julian, M.D.   DATE OF BIRTH:  [birth year]   DATE OF PROCEDURE:  06/16/2005  DATE OF DISCHARGE:                                 OPERATIVE REPORT   PREOPERATIVE DIAGNOSIS:  Abscess, left fifth toe and metatarsal head with  osteomyelitis.   POSTOPERATIVE DIAGNOSIS:  Abscess, left fifth toe and metatarsal head, with  osteomyelitis.   PROCEDURE:  Wide excision of abscess, left fifth toe, with debridement of  the metatarsal head, left fifth toe.   SURGEON:  Dr. Tamala Julian.   DESCRIPTION:  Under spinal anesthesia, the patient's left foot and leg were  prepped and draped in a sterile field. The large abscess cavity along the  fifth metatarsal was unroofed, and all the necrotic tissue was removed. The  infected callus that extended to the metatarsal head was excised down to the  fascia overlying the metatarsal head. Curetting of the area was carried out  without difficulty. The joint appeared to be stable. The area was irrigated.  It was packed with moist saline gauze and covered with gauze and Kling  dressings. The patient tolerated the procedure well. He was transferred to  the postanesthetic care unit in satisfactory condition.      Vernon Prey. Tamala Julian, M.D.  Electronically Signed     LCS/MEDQ  D:  06/16/2005  T:  06/16/2005  Job:  [id number]

## 2011-03-10 NOTE — Discharge Summary (Signed)
[name], [name]                ACCOUNT NO.:  [id number]   MEDICAL RECORD NO.:  [id number]          PATIENT TYPE:  INP   LOCATION:  A312                          FACILITY:  [facility]   PHYSICIAN:  [name] G. McInnis, MD   DATE OF BIRTH:  04/12/1936   DATE OF ADMISSION:  03/24/2005  DATE OF DISCHARGE:  06/07/2006LH                                 DISCHARGE SUMMARY   DIAGNOSIS:  Cellulitis of dorsum of foot, infected callus on plantar surface  of foot, diabetes mellitus type 2, atrial fibrillation, history of coronary  artery disease, hypertension, and hyperlipidemia, bacterial infection due to  Streptococcus group B.  The patient's condition is stable and improved at  the time of discharge.  This 75 year old white male was seen in the office,  presenting there with chief complaint of painful, inflamed left foot.  The  patient was examined and noted to have extremely inflamed foot mainly on the  dorsal part of the foot.  This was extremely painful and tender.  A large,  callused area with purulent base was noted on the plantar surface of the  left foot.  The patient was also checked for sugar in the office and noted  to have elevated blood sugar at 256.  It was recommended the patient be  admitted to the hospital for IV antibiotics and possible surgical  intervention.  I discussed the situation with Dr. Tamala [name] who agreed to see  the patient in consultation.   PHYSICAL EXAMINATION:  Alert white male with blood pressure of 148/87, pulse  110, respirations 20, temperature 100.5.  HEENT:  PERRLA.  TMs negative.  Oropharynx benign.  Neck is supple with no JVD or thyroid abnormalities.  Chest:  Well-healed sternotomy incision.  Heart:  Regular rhythm with no  murmurs and no cardiomegaly.  Lungs are clear.  Abdomen with no palpable  organs or masses.  Extremities:  The patient has inflamed, tender, swollen  left foot.  On the plantar surface of the foot is a large callused area that  has purulent  drainage.   LABORATORY DATA:  Admission CBC:  WBC 10,100, hemoglobin 12.2, hematocrit  35.8.  Subsequent CBC on 03/26/05:  WBC 10,300 with hemoglobin 10.2,  hematocrit 35.8.  Chemistries:  Sodium 137, potassium 4.4, chloride 102, CO2  26, glucose 145.  BUN 14, creatinine 1.  Calcium 8.2, total protein 5.7.  Albumin 2.6.  Liver enzymes: SGOT 14, SGPT 15, alkaline phosphatase 67,  bilirubin 1.  Lipid profile:  Cholesterol 107, triglycerides 99, LDL  cholesterol 51, HDL 36.  Urinalysis negative.  Wound culture:  Group B  strep.   HOSPITAL COURSE:  The patient was admitted and placed on  2000-calorie ADA  diet.  He was given IV of half normal saline at _kvo_________  rate.  Vital  signs were monitored.  He was started on Lovenox 40 mg subcutaneously daily.  Unasyn 1.5 g every six hours intravenously.  Accu-Chek was monitored a.c.  and h.s.  The wound was cultured, and it was noted to be group B strep.  The  patient was given Dilaudid  1 mg IV q.4h. p.r.n. for pain.  He ws seen in  consultation by Dr. Tamala [name].  He was placed on Humalog insulin according to  the sliding scale.  Accu-Cheks a.c. and h.s. were monitored.  The patient  slowly improved throughout his hospital stay.  Also, metoprolol 50 mg was  started b.i.d., Hyzaar 50/12.5 mg daily.  Norvasc 5 mg daily.  Subsequently,  he was placed on doxycycline 100 mg IV q.12h.  He was placed back on  Coumadin.  His prothrombin times were monitored.  The patient was  subsequently placed on Glucophage-XR 500 mg daily.  His sugars remained  stable.  The patient slowly improved.  The inflammation decreased on the  dorsum of his left foot.  He was able to be discharged without  surgical intervention.  He was discharged on the fifth hospital day.  He was  discharged on doxycycline 100 mg twice a day and Augmentin 850 mg daily.  Coumadin 5 mg daily.  The patient is stable and improved at the time of  discharge.       AGM/MEDQ  D:  04/20/2005  T:   04/20/2005  Job:  [id number]

## 2011-03-10 NOTE — H&P (Signed)
[name]                ACCOUNT NO.:  [id number]   MEDICAL RECORD NO.:  [id number]          PATIENT TYPE:  INP   LOCATION:  A309                          FACILITY:  [facility]   PHYSICIAN:  Angus G. Everette Rank, MD   DATE OF BIRTH:  [birth year]   DATE OF ADMISSION:  03/24/2005  DATE OF DISCHARGE:  LH                                HISTORY & PHYSICAL   A 75 year old white male was seen in the office today, presenting there with  the chief complaint being painful inflamed left foot. The patient was  examined and noted to have an extremely inflamed foot, mainly in the dorsal  part of the foot. This was extremely tender and painful, and a large  calloused area with a purulent base was noted on the plantar surface of the  left foot. The patient also was checked for sugar in the office and noted to  have elevated blood sugar at 256. It was recommended the patient be admitted  to the hospital for IV antibiotics and possible surgical intervention. I  discussed the situation with Dr. Tamala Julian who agreed to see the patient in  consultation.   FAMILY HISTORY:  Mother had heart disease. No known diabetes.   SOCIAL HISTORY:  The patient does not smoke or drink alcohol.   PAST MEDICAL AND SURGICAL HISTORY:  1.  The patient has had tonsillectomy earlier in life.  2.  Had fracture of the left tibia.  3.  CABG for coronary artery disease, five bypass surgeries in the 1990s.  4.  The patient had cancer operation on the left jaw, apparently squamous      cell carcinoma in 1990.  5.  He patient has known atrial fibrillation  6.  Hypertension.  7.  Dyslipidemia.   ALLERGIES:  No known allergies.   MEDICATIONS:  1.  Metoprolol 50 mg b.i.d.  2.  Norvasc 5 mg daily.  3.  Hyzaar 50.  4.  Coumadin daily.  5.  Vytorin 10/20.   REVIEW OF SYSTEMS:  HEENT:  Negative. CARDIOPULMONARY:  No cough,  hemoptysis, dyspnea. GASTROINTESTINAL:  No bowel irregularity __________.  GENITOURINARY:  No dysuria or  hematuria.   PHYSICAL EXAMINATION:  GENERAL:  Alert male. BP __________, pulse  ___________, respirations __________, temperature __________.  HEENT:  Eyes:  PERRLA. TMs negative. Oropharynx benign.  NECK:  Supple. No JVD or thyroid abnormalities.  CHEST:  Well healed sternotomy incision.  HEART:  Regular rhythm. No murmurs. No cardiomegaly.  LUNGS:  Clear to P&A.  ABDOMEN:  No palpable organs or masses.  GENITALIA:  Normal. No hernia.  EXTREMITIES:  The patient has inflamed, tender, swollen left foot. On the  plantar surface of the foot is a large calloused area that is purulent.   DIAGNOSES:  1.  Cellulitis of left foot, calloused area that seems to be infected on the      plantar surface of the left foot.  2.  History of coronary artery disease.  3.  Possible new onset diabetes with hyperglycemia.  4.  Hypertension by history.  5.  History of atrial  fibrillation.      AGM/MEDQ  D:  03/24/2005  T:  03/24/2005  Job:  [id number]

## 2011-03-10 NOTE — Procedures (Signed)
NAMEHANEEF, HALLQUIST                ACCOUNT NO.:  [id number]   MEDICAL RECORD NO.:  [id number]          PATIENT TYPE:  OUT   LOCATION:  RAD                           FACILITY:  [facility]   PHYSICIAN:  Scarlett Presto, M.D.   DATE OF BIRTH:  June 07, 1936   DATE OF PROCEDURE:  12/26/2004  DATE OF DISCHARGE:                                  ECHOCARDIOGRAM   PRIMARY CARE PHYSICIAN:  Dr. Newt Minion.   TAPE NUMBER:  [id number].   TAPE COUNT:  [id number] through 5624.   This is a 75 year old man with hypertension, coronary disease status post  bypass surgery, and atrial fibrillation.   Technical quality of the study is adequate. The patient is in atrial  fibrillation throughout this study.   M-MODE TRACINGS:  Aorta is 39 mm.   Left atrium is 52 mm.   The septum is 13 mm.   The posterior wall is 13 mm.   Left ventricular diastolic dimension is 50 mm.   Left ventricular systolic dimension is 42 mm.   TWO-D AND DOPPLER IMAGING:  The left ventricle is is mildly dilated with  mildly depressed left ventricular systolic function. Estimated ejection  fraction 45-50%. There is generalized global hypokinesis with mild  concentric left ventricular hypertrophy.   The right ventricle appears to be normal size with normal systolic function.   Both atria are enlarged.   The aortic valve is sclerotic with no stenosis or regurgitation.   The mitral valve has mild regurgitation.   The tricuspid valve has mild regurgitation.   There is no pericardial effusion.   The ascending aorta and inferior vena cava were poorly seen.      JH/MEDQ  D:  12/26/2004  T:  12/26/2004  Job:  [id number]   cc:   Estill Bamberg. Karie Kirks, M.D.  [address] [place], [place] [address]  Fax: [phone]

## 2011-03-10 NOTE — H&P (Signed)
[place]. [facility]  Patient:    [name], [name]                      MRN: [id number] Adm. Date:  [id number] Attending:  Garth Bigness CC:         Dr. Maryland Pink [facility])  [name], M.D.   History and Physical  CHIEF COMPLAINT:  Shortness of breath and aching in the back of my neck.  HISTORY OF PRESENT ILLNESS:  [name] is a 75 year old retired married white male who has known coronary artery disease.  He is status post a "light heart attack" with subsequent bypass surgery by Dr. Merleen Nicely in 1994 or 1995.  Records are pending.  He has not had any followup with Dr. Lattie Haw in several years.  He also has a history of hypertension, obesity, and a question of hyperlipidemia.  He has never smoked or drank.  He had a cystoscopy on Thursday, March 28, 2001, for hematuria.  He said he had premalignant cells by Dr. Maryland Pink.  Several biopsy were taken.  He had some minimal hematuria after the procedure, but this has cleared.  He was given Tylox and Septra DS for five days.  He felt pretty well for a couple of days, but last night did not feel well and today his son-in-law said he looked like he just did not feel well and was not kidding around as he usually does.  He then complained of some shortness of breath and some neck pain.  Some of these symptoms are similar to what he had when he had his heart attack in the past.  He denies any exertional angina or ischemic symptoms.  He is a quite Probation officer.  Sometimes he will play as much as 27 to 36 holes of golf.  He came to the [facility] Emergency Room.  He had T-wave inversion of V5 and V6, which was different than the previous EKG.  EKG here at [facility] shows the T-waves to be back upright.  His first set of enzymes were negative.  His other blood work was unremarkable.  Chest x-ray showed a question of mild cardiomegaly, but it  is portable.  There was no edema and no acute pulmonary disease.  He was transferred or further management.  He was found to be very hypertensive in the emergency room, with systolics as high as 952 to 220, even en route on [facility].  PAST MEDICAL HISTORY:  He is status post left jaw resection in 1993, for squamous cell carcinoma.  CURRENT MEDICATIONS: 1. Cozaar 50 mg q.d. 2. Aspirin 1 p.o. q.d.  He has stopped this recently because of the hematuria. 3. Multivitamin q.d. 4. Sulfa and antibiotics for three days. 5. Tylox p.r.n.  He is concerned that the Tylox may have caused his shortness    of breath and feeling bad.  He has had a significant headache since his spinal anesthesia.  REVIEW OF SYSTEMS:  His review of systems is remarkable for no hematemesis, gastroesophageal reflux symptoms, weight change, symptoms of peripheral vascular disease, and no previous history of stroke.  ALLERGIES:  He has no known drug allergies.  Tylox may be intolerable, however.  PHYSICAL EXAMINATION:  VITAL SIGNS:  Blood pressure 148/90 when he arrived here.  It has gone up and down since.  He is on IV nitroglycerin at 18 mcg.  His heart rate was 80 and regular with  occasional PVCs.  O2 saturation was 100%.  GENERAL:  He is anxious and has a ruddy complexion.  NECK:  Carotid upstrokes are equal bilaterally without bruits.  He is status post left jaw resection on the left.  There is no JVD.  There is no thyromegaly.  CARDIAC:  Regular rate and rhythm.  LUNGS:  Clear.  ABDOMEN:  Protuberant, with good bowel sounds.  EXTREMITIES:  No edema.  Pulses were 3+/4+ bilaterally.  There is no edema.  NEUROLOGICAL:  Grossly intact.  ASSESSMENT: 1. Shortness of breath and back and neck pain, questionably related to spinal    anesthesia versus unstable angina.  T-wave changes are worrisome and he is    status post coronary artery bypass surgery seven years ago.  He has had no    longitudinal  followup. 2. Hypertension. 3. Obesity. 4. Hematuria with questionable bladder cancer. 5. Status post left jaw resection for squamous cell carcinoma. 6. Post spinal anesthesia headache.  PLAN: 1. Check serial enzymes. 2. Continue IV nitroglycerin and heparin. 3. The patient has also been given aspirin and IV beta-blockade and we will    give him p.o. beta-blockers. 4. 2-D echocardiogram to assess LV function. 46. Pull old medical records to get the details of the previous bypass surgery. 6. Check serial enzymes. 7. Check TSH and other labs. 8. Fasting lipid panel. 9. Finish out sulfa antibiotic for bladder procedure. 10. Watch for hematuria.  If gross hematuria occurs, we will have to stop the     anticoagulation. 11. Have low threshold for cardiac catheterization.  I would like to evaluate     him over the next 12 to 24 hours to see how he responds.  A GXT Cardiolite     would be a minimum objective assessment prior to discharge. DD:  03/31/01 TD:  04/01/01 Job: [id number]

## 2011-03-13 ENCOUNTER — Ambulatory Visit (INDEPENDENT_AMBULATORY_CARE_PROVIDER_SITE_OTHER): Payer: Medicare Other | Admitting: *Deleted

## 2011-03-13 DIAGNOSIS — Z7901 Long term (current) use of anticoagulants: Secondary | ICD-10-CM

## 2011-03-13 DIAGNOSIS — I4891 Unspecified atrial fibrillation: Secondary | ICD-10-CM

## 2011-03-13 LAB — POCT INR: INR: 2.3

## 2011-04-17 ENCOUNTER — Ambulatory Visit (INDEPENDENT_AMBULATORY_CARE_PROVIDER_SITE_OTHER): Payer: Medicare Other | Admitting: *Deleted

## 2011-04-17 DIAGNOSIS — I4891 Unspecified atrial fibrillation: Secondary | ICD-10-CM

## 2011-04-17 DIAGNOSIS — Z7901 Long term (current) use of anticoagulants: Secondary | ICD-10-CM

## 2011-04-17 LAB — POCT INR: INR: 3

## 2011-05-15 ENCOUNTER — Ambulatory Visit (INDEPENDENT_AMBULATORY_CARE_PROVIDER_SITE_OTHER): Payer: Medicare Other | Admitting: *Deleted

## 2011-05-15 DIAGNOSIS — Z7901 Long term (current) use of anticoagulants: Secondary | ICD-10-CM

## 2011-05-15 DIAGNOSIS — I4891 Unspecified atrial fibrillation: Secondary | ICD-10-CM

## 2011-05-15 LAB — POCT INR: INR: 3

## 2011-06-12 ENCOUNTER — Ambulatory Visit (INDEPENDENT_AMBULATORY_CARE_PROVIDER_SITE_OTHER): Payer: Medicare Other | Admitting: *Deleted

## 2011-06-12 DIAGNOSIS — Z7901 Long term (current) use of anticoagulants: Secondary | ICD-10-CM

## 2011-06-12 DIAGNOSIS — I4891 Unspecified atrial fibrillation: Secondary | ICD-10-CM

## 2011-06-12 LAB — POCT INR: INR: 3.6

## 2011-06-27 ENCOUNTER — Encounter: Payer: Self-pay | Admitting: Cardiology

## 2011-06-28 ENCOUNTER — Ambulatory Visit (INDEPENDENT_AMBULATORY_CARE_PROVIDER_SITE_OTHER): Payer: Medicare Other | Admitting: Cardiology

## 2011-06-28 ENCOUNTER — Encounter: Payer: Self-pay | Admitting: Cardiology

## 2011-06-28 DIAGNOSIS — I4891 Unspecified atrial fibrillation: Secondary | ICD-10-CM

## 2011-06-28 DIAGNOSIS — E785 Hyperlipidemia, unspecified: Secondary | ICD-10-CM

## 2011-06-28 DIAGNOSIS — Z8679 Personal history of other diseases of the circulatory system: Secondary | ICD-10-CM

## 2011-06-28 DIAGNOSIS — E119 Type 2 diabetes mellitus without complications: Secondary | ICD-10-CM

## 2011-06-28 DIAGNOSIS — I10 Essential (primary) hypertension: Secondary | ICD-10-CM

## 2011-06-28 NOTE — Assessment & Plan Note (Addendum)
Blood pressure control somewhat suboptimal in the office, but patient reports values typically of 130/75 at home with rare systolics in the 286N.  3 of his medications are at maximal dose and increasing Bystolic is not feasible due to bradycardia

## 2011-06-28 NOTE — Progress Notes (Signed)
HPI : [name] returns to the office after an 18 month hiatus for evaluation and treatment of atrial fibrillation, coronary artery disease and cardiovascular risk factors.  Since previous visit, he has done extremely well.  He notes fatigue in his legs when he walks excessively, but no dyspnea nor chest discomfort.  He has not required hospitalization or evaluation in the emergency department.  His biggest problem continues to be his peripheral neuropathy, but he has had no skin ulcerations for more than a year.  Patient lost considerable weight after he adopted a low glycemic index diet, but he has regained a substantial portion, approximately 50%.  Current Outpatient Prescriptions on File Prior to Visit  Medication Sig Dispense Refill  . amLODipine (NORVASC) 10 MG tablet Take 10 mg by mouth daily.        . betamethasone dipropionate (DIPROLENE) 0.05 % cream Apply topically as directed.        . fish oil-omega-3 fatty acids 1000 MG capsule Take 1 capsule by mouth daily.        Marland Kitchen losartan-hydrochlorothiazide (HYZAAR) 100-25 MG per tablet Take 1 tablet by mouth daily.        . metFORMIN (GLUCOPHAGE) 500 MG tablet Take 500 mg by mouth daily.        . Multiple Vitamins-Minerals (CENTRUM SILVER PO) Take 1 tablet by mouth daily.        . nebivolol (BYSTOLIC) 5 MG tablet Take 5 mg by mouth daily.        Marland Kitchen warfarin (COUMADIN) 2.5 MG tablet Take 2.5 mg by mouth daily. As directed by Anticoagulation clinic.          Allergies  Allergen Reactions  . Amoxicillin   . Sulfonamide Derivatives       Past medical history, social history, and family history reviewed and updated.  ROS: Denies lightheadedness, palpitations, syncope, orthopnea and PND.  Some chronic pedal edema.  PHYSICAL EXAM: BP 156/84  Pulse 58  Ht 5' 8.5" (1.74 m)  Wt 90.266 kg (199 lb)  BMI 29.82 kg/m2  SpO2 97%  General-Well developed; no acute distress Body habitus-overweight Neck-No JVD; no carotid bruits Lungs-clear lung  fields; resonant to percussion Cardiovascular-normal PMI; normal S1 and S2; grade 2/6 early and mid systolic murmur across the precordium Abdomen-normal bowel sounds; soft and non-tender without masses or organomegaly Musculoskeletal-No deformities, no cyanosis or clubbing Neurologic-Normal cranial nerves; symmetric strength and tone Skin-Warm, no significant lesions Extremities-distal pulses intact; 1-2+ pretibial and ankle edema; hairless  ASSESSMENT AND PLAN:

## 2011-06-28 NOTE — Patient Instructions (Signed)
**Note De-Identified  Obfuscation** Your physician recommends that you continue on your current medications as directed. Please refer to the Current Medication list given to you today.  Your physician recommends that you schedule a follow-up appointment in: 1 year

## 2011-06-28 NOTE — Assessment & Plan Note (Addendum)
Diabetic control has been good according to the patient's report.  Dr. Karie Kirks is assessing laboratory results related to this problem and adjusting medical therapy.

## 2011-06-28 NOTE — Assessment & Plan Note (Signed)
No recent lipid profile is available.  Results will be sought from Dr. Karie Kirks.

## 2011-06-28 NOTE — Assessment & Plan Note (Addendum)
Heart rate is controlled and patient protected from thromboembolism by stable anticoagulation.  Stool samples for Hemoccult testing were negative x3 last year.  I have no CBC since 2010 at which time results were normal.  Patient is scheduled for an annual assessment with Dr. Karie Kirks in 2 weeks.  We will see the results of blood tests performed in conjunction with that visit.

## 2011-06-28 NOTE — Assessment & Plan Note (Signed)
Patient has done exceedingly well since CABG surgery approximately 20 years ago.  We will continue to work with him to a optimally managed cardiovascular risk factors.

## 2011-07-10 ENCOUNTER — Ambulatory Visit (INDEPENDENT_AMBULATORY_CARE_PROVIDER_SITE_OTHER): Payer: Medicare Other | Admitting: *Deleted

## 2011-07-10 DIAGNOSIS — Z7901 Long term (current) use of anticoagulants: Secondary | ICD-10-CM

## 2011-07-10 DIAGNOSIS — I4891 Unspecified atrial fibrillation: Secondary | ICD-10-CM

## 2011-07-10 LAB — POCT INR: INR: 3.4

## 2011-07-11 ENCOUNTER — Other Ambulatory Visit: Payer: Self-pay | Admitting: Cardiology

## 2011-08-07 ENCOUNTER — Ambulatory Visit (INDEPENDENT_AMBULATORY_CARE_PROVIDER_SITE_OTHER): Payer: Medicare Other | Admitting: *Deleted

## 2011-08-07 DIAGNOSIS — I4891 Unspecified atrial fibrillation: Secondary | ICD-10-CM

## 2011-08-07 DIAGNOSIS — Z7901 Long term (current) use of anticoagulants: Secondary | ICD-10-CM

## 2011-08-07 LAB — BASIC METABOLIC PANEL WITH GFR
BUN: 17
CO2: 21
Calcium: 8 — ABNORMAL LOW
Chloride: 105
Creatinine, Ser: 1.02
GFR calc non Af Amer: >60
Glucose, Bld: 130 — ABNORMAL HIGH
Potassium: 3.3 — ABNORMAL LOW
Sodium: 137

## 2011-08-07 LAB — DIFFERENTIAL
Basophils Absolute: 0
Basophils Relative: 0
Eosinophils Absolute: 0.1
Eosinophils Relative: 0
Lymphocytes Relative: 14
Lymphocytes Relative: 7 — ABNORMAL LOW
Lymphs Abs: 1.6
Monocytes Absolute: 0.9 — ABNORMAL HIGH
Neutro Abs: 8.6 — ABNORMAL HIGH
Neutro Abs: 9.9 — ABNORMAL HIGH
Neutrophils Relative %: 76

## 2011-08-07 LAB — CBC
Hemoglobin: 13.3
MCHC: 34
Platelets: 301
Platelets: 335
RBC: 3.81 — ABNORMAL LOW
RDW: 14.7 — ABNORMAL HIGH
WBC: 11.3 — ABNORMAL HIGH

## 2011-08-07 LAB — PROTIME-INR
INR: 1.6 — ABNORMAL HIGH
INR: 1.8 — ABNORMAL HIGH
Prothrombin Time: 21.6 — ABNORMAL HIGH

## 2011-08-07 LAB — CULTURE, BLOOD (ROUTINE X 2)
Culture: NO GROWTH
Culture: NO GROWTH
Report Status: 8022008

## 2011-08-07 LAB — BASIC METABOLIC PANEL
BUN: 15
Calcium: 7.8 — ABNORMAL LOW
Creatinine, Ser: 0.87
GFR calc Af Amer: >60
GFR calc non Af Amer: >60

## 2011-08-07 LAB — POCT INR: INR: 3.4

## 2011-09-04 ENCOUNTER — Ambulatory Visit (INDEPENDENT_AMBULATORY_CARE_PROVIDER_SITE_OTHER): Payer: Medicare Other | Admitting: *Deleted

## 2011-09-04 DIAGNOSIS — Z7901 Long term (current) use of anticoagulants: Secondary | ICD-10-CM

## 2011-09-04 DIAGNOSIS — I4891 Unspecified atrial fibrillation: Secondary | ICD-10-CM

## 2011-09-04 LAB — POCT INR: INR: 2.1

## 2011-10-04 ENCOUNTER — Ambulatory Visit (INDEPENDENT_AMBULATORY_CARE_PROVIDER_SITE_OTHER): Payer: Medicare Other | Admitting: *Deleted

## 2011-10-04 DIAGNOSIS — Z7901 Long term (current) use of anticoagulants: Secondary | ICD-10-CM

## 2011-10-04 DIAGNOSIS — I4891 Unspecified atrial fibrillation: Secondary | ICD-10-CM

## 2011-10-04 LAB — POCT INR: INR: 2

## 2011-10-22 ENCOUNTER — Other Ambulatory Visit: Payer: Self-pay | Admitting: Cardiology

## 2011-11-06 ENCOUNTER — Ambulatory Visit (INDEPENDENT_AMBULATORY_CARE_PROVIDER_SITE_OTHER): Payer: Medicare Other | Admitting: *Deleted

## 2011-11-06 ENCOUNTER — Encounter: Payer: Medicare Other | Admitting: *Deleted

## 2011-11-06 DIAGNOSIS — Z7901 Long term (current) use of anticoagulants: Secondary | ICD-10-CM

## 2011-11-06 DIAGNOSIS — I4891 Unspecified atrial fibrillation: Secondary | ICD-10-CM

## 2011-11-06 LAB — POCT INR: INR: 2.2

## 2011-12-11 ENCOUNTER — Ambulatory Visit (INDEPENDENT_AMBULATORY_CARE_PROVIDER_SITE_OTHER): Payer: Medicare Other | Admitting: *Deleted

## 2011-12-11 DIAGNOSIS — I4891 Unspecified atrial fibrillation: Secondary | ICD-10-CM

## 2011-12-11 DIAGNOSIS — Z7901 Long term (current) use of anticoagulants: Secondary | ICD-10-CM

## 2012-01-22 ENCOUNTER — Ambulatory Visit (INDEPENDENT_AMBULATORY_CARE_PROVIDER_SITE_OTHER): Payer: Medicare Other | Admitting: *Deleted

## 2012-01-22 DIAGNOSIS — Z7901 Long term (current) use of anticoagulants: Secondary | ICD-10-CM

## 2012-01-22 DIAGNOSIS — I4891 Unspecified atrial fibrillation: Secondary | ICD-10-CM

## 2012-03-04 ENCOUNTER — Ambulatory Visit (INDEPENDENT_AMBULATORY_CARE_PROVIDER_SITE_OTHER): Payer: Medicare Other | Admitting: *Deleted

## 2012-03-04 DIAGNOSIS — Z7901 Long term (current) use of anticoagulants: Secondary | ICD-10-CM

## 2012-03-04 DIAGNOSIS — I4891 Unspecified atrial fibrillation: Secondary | ICD-10-CM

## 2012-03-31 ENCOUNTER — Other Ambulatory Visit: Payer: Self-pay | Admitting: Cardiology

## 2012-04-15 ENCOUNTER — Ambulatory Visit (INDEPENDENT_AMBULATORY_CARE_PROVIDER_SITE_OTHER): Payer: Medicare Other | Admitting: *Deleted

## 2012-04-15 DIAGNOSIS — Z7901 Long term (current) use of anticoagulants: Secondary | ICD-10-CM

## 2012-04-15 DIAGNOSIS — I4891 Unspecified atrial fibrillation: Secondary | ICD-10-CM

## 2012-04-15 LAB — POCT INR: INR: 3.2

## 2012-05-27 ENCOUNTER — Ambulatory Visit (INDEPENDENT_AMBULATORY_CARE_PROVIDER_SITE_OTHER): Payer: Medicare Other | Admitting: *Deleted

## 2012-05-27 DIAGNOSIS — I4891 Unspecified atrial fibrillation: Secondary | ICD-10-CM

## 2012-05-27 DIAGNOSIS — Z7901 Long term (current) use of anticoagulants: Secondary | ICD-10-CM

## 2012-06-28 ENCOUNTER — Ambulatory Visit: Payer: Medicare Other | Admitting: Cardiology

## 2012-07-08 ENCOUNTER — Other Ambulatory Visit: Payer: Self-pay | Admitting: Cardiology

## 2012-07-08 ENCOUNTER — Encounter: Payer: Self-pay | Admitting: Cardiology

## 2012-07-08 ENCOUNTER — Ambulatory Visit (INDEPENDENT_AMBULATORY_CARE_PROVIDER_SITE_OTHER): Payer: Medicare Other | Admitting: *Deleted

## 2012-07-08 ENCOUNTER — Ambulatory Visit (INDEPENDENT_AMBULATORY_CARE_PROVIDER_SITE_OTHER): Payer: Medicare Other | Admitting: Cardiology

## 2012-07-08 VITALS — BP 162/70 | HR 53 | Ht 68.5 in | Wt 211.0 lb

## 2012-07-08 DIAGNOSIS — L97519 Non-pressure chronic ulcer of other part of right foot with unspecified severity: Secondary | ICD-10-CM

## 2012-07-08 DIAGNOSIS — IMO0002 Reserved for concepts with insufficient information to code with codable children: Secondary | ICD-10-CM

## 2012-07-08 DIAGNOSIS — E119 Type 2 diabetes mellitus without complications: Secondary | ICD-10-CM

## 2012-07-08 DIAGNOSIS — Z7901 Long term (current) use of anticoagulants: Secondary | ICD-10-CM

## 2012-07-08 DIAGNOSIS — L97509 Non-pressure chronic ulcer of other part of unspecified foot with unspecified severity: Secondary | ICD-10-CM

## 2012-07-08 DIAGNOSIS — I7090 Unspecified atherosclerosis: Secondary | ICD-10-CM

## 2012-07-08 DIAGNOSIS — I4891 Unspecified atrial fibrillation: Secondary | ICD-10-CM

## 2012-07-08 DIAGNOSIS — R0989 Other specified symptoms and signs involving the circulatory and respiratory systems: Secondary | ICD-10-CM

## 2012-07-08 DIAGNOSIS — E785 Hyperlipidemia, unspecified: Secondary | ICD-10-CM

## 2012-07-08 DIAGNOSIS — I10 Essential (primary) hypertension: Secondary | ICD-10-CM

## 2012-07-08 DIAGNOSIS — I2510 Atherosclerotic heart disease of native coronary artery without angina pectoris: Secondary | ICD-10-CM

## 2012-07-08 MED ORDER — NEBIVOLOL HCL 10 MG PO TABS: 10.0000 mg | ORAL_TABLET | Freq: Every day | ORAL | Status: DC

## 2012-07-08 NOTE — Assessment & Plan Note (Addendum)
Patient has been treated over the past month or so for recurrent ulceration over the right forefoot.  This is certainly at least partially related to peripheral neuropathy, but peripheral vascular disease could be contributing as well.  Results of vascular testing were obtained from 2006 at which time ABIs were normal.  Decreased pulses at present suggest interval progression of disease.  Vascular study will be repeated.  Carotid bruit also noted suggesting the presence of cerebrovascular disease.  Carotid ultrasound will be performed.

## 2012-07-08 NOTE — Assessment & Plan Note (Signed)
Patient remains asymptomatic with respect to coronary disease more than 15 years following CABG surgery.  We will continue our efforts to optimally control cardiovascular risk factors.

## 2012-07-08 NOTE — Progress Notes (Deleted)
Name: [name]    DOB: 04/05/1936  Age: 76 y.o.  MR#: [id number]       PCP:  No primary provider on file.      Insurance: @PAYORNAME @   CC:    Chief Complaint  Patient presents with  . Appointment    some swelling ing ankles sometimes, no more than usual of tiredness and fatigur    VS BP 162/70  Pulse 53  Ht 5' 8.5" (1.74 m)  Wt 211 lb (95.709 kg)  BMI 31.62 kg/m2  SpO2 98%  Weights Current Weight  07/08/12 211 lb (95.709 kg)  06/28/11 199 lb (90.266 kg)  03/08/10 218 lb (98.884 kg)    Blood Pressure  BP Readings from Last 3 Encounters:  07/08/12 162/70  06/28/11 156/84  03/08/10 144/72     Admit date:  (Not on file) Last encounter with RMR:  03/31/2012   Allergy Allergies  Allergen Reactions  . Amoxicillin   . Sulfonamide Derivatives     Current Outpatient Prescriptions  Medication Sig Dispense Refill  . amLODipine (NORVASC) 10 MG tablet TAKE 1 TABLET DAILY  90 tablet  2  . fish oil-omega-3 fatty acids 1000 MG capsule Take 1 capsule by mouth daily.        Marland Kitchen losartan-hydrochlorothiazide (HYZAAR) 100-25 MG per tablet Take 1 tablet by mouth daily.        . metFORMIN (GLUCOPHAGE) 500 MG tablet Take 500 mg by mouth daily.        . Multiple Vitamins-Minerals (CENTRUM SILVER PO) Take 1 tablet by mouth daily.        . nebivolol (BYSTOLIC) 5 MG tablet Take 5 mg by mouth daily.        Marland Kitchen warfarin (COUMADIN) 2.5 MG tablet TAKE ORALLY AS DIRECTED BY ANTI-COAGULATION CLINIC  60 tablet  2    Discontinued Meds:    Medications Discontinued During This Encounter  Medication Reason  . betamethasone dipropionate (DIPROLENE) 0.05 % cream Completed Course    Patient Active Problem List  Diagnosis  . Diabetes mellitus  . HYPERLIPIDEMIA  . HYPERTENSION  . Atrial fibrillation  . Arteriosclerotic cardiovascular disease (ASCVD)  . Chronic anticoagulation    LABS Anti-coag visit on 05/27/2012  Component Date Value  . INR 05/27/2012 2.5   Anti-coag visit on 04/15/2012    Component Date Value  . INR 04/15/2012 3.2      Results for this Opt Visit:     Results for orders placed in visit on 05/27/12  POCT INR      Component Value Range   INR 2.5      EKG Orders placed in visit on 03/09/10  . CONVERTED CEMR EKG     Prior Assessment and Plan Problem List as of 07/08/2012            Cardiology Problems   HYPERLIPIDEMIA   Last Assessment & Plan Note   06/28/2011 Office Visit Signed 06/28/2011 12:26 PM by Yehuda Savannah, MD    No recent lipid profile is available.  Results will be sought from Dr. Karie Kirks.    HYPERTENSION   Last Assessment & Plan Note   06/28/2011 Office Visit Addendum 06/29/2011 10:40 PM by Yehuda Savannah, MD    Blood pressure control somewhat suboptimal in the office, but patient reports values typically of 130/75 at home with rare systolics in the 174B.  3 of his medications are at maximal dose and increasing Bystolic is not feasible due to bradycardia  Atrial fibrillation   Last Assessment & Plan Note   06/28/2011 Office Visit Addendum 06/28/2011 12:30 PM by Yehuda Savannah, MD    Heart rate is controlled and patient protected from thromboembolism by stable anticoagulation.  Stool samples for Hemoccult testing were negative x3 last year.  I have no CBC since 2010 at which time results were normal.  Patient is scheduled for an annual assessment with Dr. Karie Kirks in 2 weeks.  We will see the results of blood tests performed in conjunction with that visit.    Arteriosclerotic cardiovascular disease (ASCVD)   Last Assessment & Plan Note   06/28/2011 Office Visit Signed 06/28/2011 12:23 PM by Yehuda Savannah, MD    Patient has done exceedingly well since CABG surgery approximately 20 years ago.  We will continue to work with him to a optimally managed cardiovascular risk factors.      Other   Diabetes mellitus   Last Assessment & Plan Note   06/28/2011 Office Visit Addendum 06/29/2011 10:38 PM by Yehuda Savannah, MD    Diabetic  control has been good according to the patient's report.  Dr. Karie Kirks is assessing laboratory results related to this problem and adjusting medical therapy.    Chronic anticoagulation       Imaging: No results found.   FRS Calculation: Score not calculated. Missing: Total Cholesterol

## 2012-07-08 NOTE — Assessment & Plan Note (Signed)
No recent assessment of serum lipids available.  Results will be sought from patient's PCP.

## 2012-07-08 NOTE — Assessment & Plan Note (Signed)
Blood pressure control is somewhat suboptimal.  Nevibolol will be increased to 10 mg per day.  Patient will monitor blood pressure and heart rate and reports significant abnormalities or any symptoms attributable to cerebral hypoperfusion.

## 2012-07-08 NOTE — Assessment & Plan Note (Signed)
Patient has done very well with chronic anticoagulation.  More recent laboratory results are being sought from Dr. Karie Kirks.  We will continue to manage warfarin dosing.

## 2012-07-08 NOTE — Progress Notes (Signed)
Patient ID: [name], male   DOB: 05-08-1936, 76 y.o.   MRN: [id number]  HPI: Scheduled return visit for this nice gentleman with coronary artery disease, prior CABG surgery, multiple cardiovascular risk factors and peripheral vascular disease.  Has done very well from a cardiac standpoint with no exertional dyspnea nor chest discomfort.  He has severe peripheral neuropathy with virtual anesthesia of his lower extremities and recurrent ulceration over the right first metatarsal head being managed by Dr. [name].  Having been advised to decrease walking, weight has increased.  He denies any claudication or neurologic symptoms.  He reports a degree of white coat hypertension with systolics generally good at home, but recently somewhat elevated.  Prior to Admission medications   Medication Sig Start Date End Date Taking? Authorizing Provider  amLODipine (NORVASC) 10 MG tablet TAKE 1 TABLET DAILY 07/11/11  Yes Yehuda Savannah, MD  fish oil-omega-3 fatty acids 1000 MG capsule Take 1 capsule by mouth daily.     Yes Historical Provider, MD  losartan-hydrochlorothiazide (HYZAAR) 100-25 MG per tablet Take 1 tablet by mouth daily.     Yes Historical Provider, MD  metFORMIN (GLUCOPHAGE) 500 MG tablet Take 500 mg by mouth daily.     Yes Historical Provider, MD  Multiple Vitamins-Minerals (CENTRUM SILVER PO) Take 1 tablet by mouth daily.     Yes Historical Provider, MD  nebivolol (BYSTOLIC) 5 MG tablet Take 5 mg by mouth daily.     Yes Historical Provider, MD  warfarin (COUMADIN) 2.5 MG tablet TAKE ORALLY AS DIRECTED BY ANTI-COAGULATION CLINIC 03/31/12  Yes Yehuda Savannah, MD    Allergies  Allergen Reactions  . Amoxicillin   . Sulfonamide Derivatives       Past medical history, social history, and family history reviewed and updated.  ROS: Denies orthopnea, PND, palpitations, lightheadedness or syncope.  Mild ankle edema is chronic.  All other systems reviewed and are negative.  PHYSICAL EXAM: BP  162/70  Pulse 53  Ht 5' 8.5" (1.74 m)  Wt 95.709 kg (211 lb)  BMI 31.62 kg/m2  SpO2 98%  General-Well developed; no acute distress Body habitus-proportionate weight and height Neck-No JVD; Faint left carotid bruit; normal carotid upstrokes Lungs-clear lung fields; resonant to percussion Cardiovascular-normal PMI; Split S1 and Normal S2; grade 4-6/6 basilar systolic ejection murmur Abdomen-normal bowel sounds; soft and non-tender without masses or organomegaly; no bruits Musculoskeletal-No deformities, no cyanosis or clubbing Neurologic-Normal cranial nerves; symmetric strength and tone; decreased lower extremity sensation Skin-Warm, Chronic ischemic changes over the lower legs Extremities-distal pulses Decreased to absent; 1+ ankle edema  EKG: Atrial fibrillation with controlled ventricular response; ventricular rate of 65 bpm; IVCD; ST-T wave abnormalities consistent with LVH, inferior ischemia or prior inferior MI.  ASSESSMENT AND PLAN:  Jacqulyn Ducking, MD 07/08/2012 12:02 PM

## 2012-07-08 NOTE — Assessment & Plan Note (Addendum)
Mr. Maalouf reports no symptoms attributable to atrial fibrillation.  Rate control is good at rest.  Risk of thromboembolism is very low with stable and therapeutic anticoagulation.

## 2012-07-08 NOTE — Patient Instructions (Addendum)
Your physician recommends that you schedule a follow-up appointment in:  1 - 6 months with Dr Lattie Haw 2 - 1 month Nurse visit  Your physician has requested that you regularly monitor and record your blood pressure readings at home. Please use the same machine at the same time of day to check your readings and record them to bring to your follow-up visit.  Call us if your heart rate is below 50 or your are dizzy.  Your physician has requested that you have a carotid duplex. This test is an ultrasound of the carotid arteries in your neck. It looks at blood flow through these arteries that supply the brain with blood. Allow one hour for this exam. There are no restrictions or special instructions.  Your physician has requested that you have an ankle brachial index (ABI). During this test an ultrasound and blood pressure cuff are used to evaluate the arteries that supply the arms and legs with blood. Allow thirty minutes for this exam. There are no restrictions or special instructions.  Your physician has recommended you make the following change in your medication:  1 - INCREASE Bystolic to 10 mg daily

## 2012-07-22 ENCOUNTER — Ambulatory Visit (INDEPENDENT_AMBULATORY_CARE_PROVIDER_SITE_OTHER): Payer: Medicare Other | Admitting: *Deleted

## 2012-07-22 DIAGNOSIS — Z7901 Long term (current) use of anticoagulants: Secondary | ICD-10-CM

## 2012-07-22 DIAGNOSIS — I4891 Unspecified atrial fibrillation: Secondary | ICD-10-CM

## 2012-07-22 LAB — POCT INR: INR: 2.4

## 2012-07-24 ENCOUNTER — Telehealth: Payer: Self-pay | Admitting: *Deleted

## 2012-07-24 ENCOUNTER — Encounter: Payer: Self-pay | Admitting: Cardiology

## 2012-07-24 MED ORDER — CLONIDINE HCL 0.2 MG/24HR TD PTWK: 1.0000 | MEDICATED_PATCH | TRANSDERMAL | Status: DC

## 2012-07-24 NOTE — Telephone Encounter (Signed)
Recommendations discussed with patient and he verbalizes understanding

## 2012-07-24 NOTE — Telephone Encounter (Signed)
Message copied by Manson Allan on Wed Jul 24, 2012 12:13 PM ------      Message from: Yehuda Savannah      Created: Wed Jul 24, 2012 10:34 AM      Regarding: hypertension       Home BP log collected in 06/2012 and reviewed.  24 determinations provided with all diastolics less than 90 except on one occasion and also systolics less than 161 save 2 measurements, 143 and 154 mmHg.            Slightly suboptimal blood pressure control.  Add clonidine TTS level II if covered on patient's plan; otherwise, clonidine 0.1 mg b.i.d.

## 2012-07-26 ENCOUNTER — Encounter: Payer: Self-pay | Admitting: Cardiology

## 2012-08-05 ENCOUNTER — Ambulatory Visit (HOSPITAL_COMMUNITY)
Admission: RE | Admit: 2012-08-05 | Discharge: 2012-08-05 | Disposition: A | Discharge status: Home / Self Care | Payer: Medicare Other | Source: Ambulatory Visit | Attending: Cardiology | Admitting: Cardiology

## 2012-08-05 DIAGNOSIS — IMO0002 Reserved for concepts with insufficient information to code with codable children: Secondary | ICD-10-CM

## 2012-08-05 DIAGNOSIS — R0989 Other specified symptoms and signs involving the circulatory and respiratory systems: Secondary | ICD-10-CM | POA: Insufficient documentation

## 2012-08-05 DIAGNOSIS — E1169 Type 2 diabetes mellitus with other specified complication: Secondary | ICD-10-CM | POA: Insufficient documentation

## 2012-08-05 DIAGNOSIS — I10 Essential (primary) hypertension: Secondary | ICD-10-CM | POA: Insufficient documentation

## 2012-08-05 DIAGNOSIS — L97809 Non-pressure chronic ulcer of other part of unspecified lower leg with unspecified severity: Secondary | ICD-10-CM | POA: Insufficient documentation

## 2012-08-05 DIAGNOSIS — I872 Venous insufficiency (chronic) (peripheral): Secondary | ICD-10-CM | POA: Insufficient documentation

## 2012-08-06 ENCOUNTER — Telehealth: Payer: Self-pay | Admitting: *Deleted

## 2012-08-06 ENCOUNTER — Encounter: Payer: Self-pay | Admitting: Cardiology

## 2012-08-06 DIAGNOSIS — I739 Peripheral vascular disease, unspecified: Secondary | ICD-10-CM | POA: Insufficient documentation

## 2012-08-06 NOTE — Telephone Encounter (Signed)
Late note:  Patient walked in office 10/14 with c/o redness of face and legs, stating this did not start until he initiated his clonidine patch, however no redness noted at site where patch was placed.  Advised him to remove patch for now and consult his PCP, as it is unlikely that the patch caused this type of reaction, however he needed to be assessed to be sure.  Has an appt today for evaluation and will call me with recommendations from PCP.

## 2012-08-06 NOTE — Telephone Encounter (Signed)
Spoke with [name], who has consulted with his PCP and states that he does not feel that the patch is related to his reaction and has placed him on antibiotic therapy, therefore he will continue clonidine patch, as directed.  Will notify Edrick Oh, as this relates to his INR.

## 2012-08-07 ENCOUNTER — Ambulatory Visit (INDEPENDENT_AMBULATORY_CARE_PROVIDER_SITE_OTHER): Payer: Medicare Other | Admitting: *Deleted

## 2012-08-07 ENCOUNTER — Telehealth: Payer: Self-pay | Admitting: *Deleted

## 2012-08-07 VITALS — BP 162/80 | HR 71 | Ht 69.0 in | Wt 220.0 lb

## 2012-08-07 DIAGNOSIS — I10 Essential (primary) hypertension: Secondary | ICD-10-CM

## 2012-08-07 NOTE — Progress Notes (Signed)
Presents for blood pressure check, s/p office visit on 1/44, where Bystolic was increased to 10 mg daily and initiation of Clonidine TTS II on 10/2.  BP was 162/73 with 58 HR, at 9/16 visit.  Patient did not return blood pressures, as requested today, however states consistent BP at home in 130-140 range over 80's.  Advised him to continue to collect pressures and return them to [place].

## 2012-08-07 NOTE — Telephone Encounter (Signed)
Called patient to make appt to see Lattie Haw next Thursday.  Message left to return call.

## 2012-08-15 ENCOUNTER — Ambulatory Visit (INDEPENDENT_AMBULATORY_CARE_PROVIDER_SITE_OTHER): Payer: Medicare Other | Admitting: *Deleted

## 2012-08-15 DIAGNOSIS — I4891 Unspecified atrial fibrillation: Secondary | ICD-10-CM

## 2012-08-15 DIAGNOSIS — Z7901 Long term (current) use of anticoagulants: Secondary | ICD-10-CM

## 2012-08-15 LAB — POCT INR: INR: 3.1

## 2012-08-23 ENCOUNTER — Other Ambulatory Visit: Payer: Self-pay | Admitting: *Deleted

## 2012-08-23 MED ORDER — CLONIDINE HCL 0.2 MG/24HR TD PTWK: 1.0000 | MEDICATED_PATCH | TRANSDERMAL | Status: DC

## 2012-08-23 MED ORDER — WARFARIN SODIUM 2.5 MG PO TABS: ORAL_TABLET | ORAL | Status: DC

## 2012-08-23 NOTE — Telephone Encounter (Signed)
Fax Received. Refill Completed. [name] (R.M.A)   

## 2012-08-26 NOTE — Progress Notes (Signed)
Patient ID: [name], male   DOB: 1936-07-05, 76 y.o.   MRN: [id number]  Agree.

## 2012-08-27 ENCOUNTER — Other Ambulatory Visit: Payer: Self-pay | Admitting: Cardiology

## 2012-08-27 MED ORDER — CLONIDINE HCL 0.2 MG/24HR TD PTWK: 1.0000 | MEDICATED_PATCH | TRANSDERMAL | Status: DC

## 2012-08-27 MED ORDER — WARFARIN SODIUM 2.5 MG PO TABS: ORAL_TABLET | ORAL | Status: DC

## 2012-09-12 ENCOUNTER — Ambulatory Visit (INDEPENDENT_AMBULATORY_CARE_PROVIDER_SITE_OTHER): Payer: Medicare Other | Admitting: *Deleted

## 2012-09-12 DIAGNOSIS — I4891 Unspecified atrial fibrillation: Secondary | ICD-10-CM

## 2012-09-12 DIAGNOSIS — Z7901 Long term (current) use of anticoagulants: Secondary | ICD-10-CM

## 2012-10-10 ENCOUNTER — Ambulatory Visit (INDEPENDENT_AMBULATORY_CARE_PROVIDER_SITE_OTHER): Payer: Medicare Other | Admitting: *Deleted

## 2012-10-10 DIAGNOSIS — Z7901 Long term (current) use of anticoagulants: Secondary | ICD-10-CM

## 2012-10-10 DIAGNOSIS — I4891 Unspecified atrial fibrillation: Secondary | ICD-10-CM

## 2012-11-07 ENCOUNTER — Ambulatory Visit (INDEPENDENT_AMBULATORY_CARE_PROVIDER_SITE_OTHER): Payer: Medicare Other | Admitting: *Deleted

## 2012-11-07 DIAGNOSIS — Z7901 Long term (current) use of anticoagulants: Secondary | ICD-10-CM

## 2012-11-07 DIAGNOSIS — I4891 Unspecified atrial fibrillation: Secondary | ICD-10-CM

## 2012-11-07 LAB — POCT INR: INR: 2.7

## 2012-12-04 ENCOUNTER — Ambulatory Visit (INDEPENDENT_AMBULATORY_CARE_PROVIDER_SITE_OTHER): Payer: Medicare Other | Admitting: *Deleted

## 2012-12-04 DIAGNOSIS — I4891 Unspecified atrial fibrillation: Secondary | ICD-10-CM

## 2012-12-04 DIAGNOSIS — Z7901 Long term (current) use of anticoagulants: Secondary | ICD-10-CM

## 2013-01-01 ENCOUNTER — Ambulatory Visit (INDEPENDENT_AMBULATORY_CARE_PROVIDER_SITE_OTHER): Payer: Medicare Other | Admitting: *Deleted

## 2013-01-01 DIAGNOSIS — Z7901 Long term (current) use of anticoagulants: Secondary | ICD-10-CM

## 2013-01-01 DIAGNOSIS — I4891 Unspecified atrial fibrillation: Secondary | ICD-10-CM

## 2013-01-06 ENCOUNTER — Ambulatory Visit (INDEPENDENT_AMBULATORY_CARE_PROVIDER_SITE_OTHER): Payer: Medicare Other | Admitting: Cardiology

## 2013-01-06 ENCOUNTER — Encounter: Payer: Self-pay | Admitting: Cardiology

## 2013-01-06 VITALS — BP 144/71 | HR 68 | Ht 69.0 in | Wt 210.2 lb

## 2013-01-06 DIAGNOSIS — I10 Essential (primary) hypertension: Secondary | ICD-10-CM

## 2013-01-06 DIAGNOSIS — R609 Edema, unspecified: Secondary | ICD-10-CM

## 2013-01-06 DIAGNOSIS — I2510 Atherosclerotic heart disease of native coronary artery without angina pectoris: Secondary | ICD-10-CM

## 2013-01-06 MED ORDER — AMLODIPINE BESYLATE 2.5 MG PO TABS: 2.5000 mg | ORAL_TABLET | Freq: Every day | ORAL | Status: DC

## 2013-01-06 MED ORDER — CLONIDINE HCL 0.3 MG/24HR TD PTWK: 1.0000 | MEDICATED_PATCH | TRANSDERMAL | Status: DC

## 2013-01-06 NOTE — Assessment & Plan Note (Signed)
Anticoagulation has been stable and therapeutic without apparent complications.  Patient cannot recall the year of his most recent colonoscopy, but is certain that it is within the past 10 years and that no lesions were identified.  A recent CBC will be requested from Dr. Vickey Sages office and stool for Hemoccult testing obtained.

## 2013-01-06 NOTE — Assessment & Plan Note (Signed)
Patient reports recent laboratory studies performed by Dr. Karie Kirks. These results have been requested.

## 2013-01-06 NOTE — Progress Notes (Signed)
Patient ID: [name], male   DOB: 07-29-1936, 77 y.o.   MRN: [id number]  HPI: Schedule return visit for this very nice gentleman with a history of coronary artery disease, multiple cardiovascular risk factors, atrial fibrillation and peripheral vascular disease.  Since his last visit, he has done generally well.  The ulceration on the plantar surface of his right foot has healed. Carotid ultrasound showed mild atherosclerosis without focal stenosis. PV studies were incomplete due to to noncompressible vessels, but no obstruction was identified.  Over the past few weeks, he has noted increased edema, primarily of the right leg. At times, fluid extravasation into the skin has been noted. He has not experienced any pain in that leg, but reminds me that he has a severe neuropathy. He has no history of DVT.  Blood pressures have been monitored at home and have been excellent with systolics in the 409W and 119J and all diastolics less than 80.  Current Outpatient Prescriptions  Medication Sig Dispense Refill  . amLODipine (NORVASC) 2.5 MG tablet Take 1 tablet (2.5 mg total) by mouth daily.  90 tablet  3  . fish oil-omega-3 fatty acids 1000 MG capsule Take 1 capsule by mouth daily.        Marland Kitchen losartan-hydrochlorothiazide (HYZAAR) 100-25 MG per tablet Take 1 tablet by mouth daily.        . metFORMIN (GLUCOPHAGE) 500 MG tablet Take 500 mg by mouth daily.        . Multiple Vitamins-Minerals (CENTRUM SILVER PO) Take 1 tablet by mouth daily.        . nebivolol (BYSTOLIC) 10 MG tablet Take 1 tablet (10 mg total) by mouth daily.  30 tablet  11  . silver sulfADIAZINE (SILVADENE) 1 % cream       . warfarin (COUMADIN) 2.5 MG tablet 2.5 mg daily except 3.75 mg on Wednesdays  135 tablet  0  . cloNIDine (CATAPRES - DOSED IN MG/24 HR) 0.3 mg/24hr Place 1 patch (0.3 mg total) onto the skin once a week.  4 patch  12   No current facility-administered medications for this visit.    Allergies  Allergen Reactions   . Amoxicillin   . Sulfonamide Derivatives      Past medical history, social history, and family history reviewed and updated.  ROS: Denies palpitations, lightheadedness, chest pain or dyspnea.  All other systems reviewed and are negative  PHYSICAL EXAM: BP 144/71  Pulse 68  Ht 5' 9"  (1.753 m)  Wt 95.369 kg (210 lb 4 oz)  BMI 31.03 kg/m2  SpO2 97%;  Body mass index is 31.03 kg/(m^2). General-Well developed; no acute distress Body habitus-mildly overweight Neck-No JVD; no carotid bruits Lungs-clear lung fields; resonant to percussion Cardiovascular-normal PMI; normal S1 and S2; modest systolic murmur at the cardiac base Abdomen-normal bowel sounds; soft and non-tender without masses or organomegaly Musculoskeletal-No deformities, no cyanosis or clubbing Neurologic-Normal cranial nerves; symmetric strength and tone Skin-Warm Extremities- 1+ edema on the left; 2+ on the right; right leg bandaged; erythematous skin with some exfoliation of the epidermis  EKG:  Atrial fibrillation with a slow ventricular response and a ventricular rate of 56 bpm; leftward axis; delayed R-wave progression; nonspecific T wave abnormality. No previous tracing for comparison.  Jacqulyn Ducking, MD 01/06/2013  12:18 PM  ASSESSMENT AND PLAN

## 2013-01-06 NOTE — Assessment & Plan Note (Signed)
Patient continues to do extremely well from a cardiovascular standpoint with no symptoms to suggest recurrent myocardial ischemia and excellent control of cardiovascular risk factors.

## 2013-01-06 NOTE — Assessment & Plan Note (Addendum)
Blood pressure has been excellent at home and slightly elevated in the office. Current therapy needs to be changed due to peripheral edema. Amlodipine dosage will be decreased to 2.5 mg per day and clonidine dosage increased to 0.3 mg per week.

## 2013-01-06 NOTE — Assessment & Plan Note (Addendum)
Patient has atherosclerotic disease but no definite symptoms or problems related to that. Consultation with a vascular expert will be deferred. He has new edema, primarily in the right leg. Amlodipine dosage will be reduced markedly and venous ultrasound performed to exclude DVT. Salt restriction and leg elevation will also be emphasized. I will reassess his edema in 3 weeks.

## 2013-01-06 NOTE — Assessment & Plan Note (Signed)
Office values obtained by Dr. Karie Kirks will be reviewed and therapy adjusted if necessary.

## 2013-01-06 NOTE — Assessment & Plan Note (Signed)
Atrial fibrillation appears to be well managed with a fairly slow heart rate, but no symptoms to suggest cerebral hypoperfusion. Current rate control medication will be continued.

## 2013-01-06 NOTE — Patient Instructions (Addendum)
Your physician recommends that you schedule a follow-up appointment in: 3 weeks  Your physician has recommended you make the following change in your medication:  1 - DECREASE Amlodipine to 2.5 mg daily 2 - INCREASE Clonidine Patch to III weekly  Ultrasound of right leg

## 2013-01-06 NOTE — Progress Notes (Deleted)
Name: [name]    DOB: [birth year]  Age: 77 y.o.  MR#: [id number]       PCP:  No primary provider on file.      Insurance: Payor: MEDICARE  Plan: MEDICARE PART B  Product Type: *No Product type*    CC:   BOTTLES PT ADVISED EDEMA IN HIS RIGHT LEG EVERYDAY FOR A MONTH NOW, ALSO NOTES WEEPING PERIODICALLY  HAD A CAROTID/ABI 0-24-09 BYSTOLIC 73ZH ADDED AT LAST OV  VS Filed Vitals:   01/06/13 1137  BP: 144/71  Pulse: 68  Height: 5' 9"  (1.753 m)  Weight: 210 lb 4 oz (95.369 kg)  SpO2: 97%    Weights Current Weight  01/06/13 210 lb 4 oz (95.369 kg)  08/07/12 220 lb (99.791 kg)  07/08/12 211 lb (95.709 kg)    Blood Pressure  BP Readings from Last 3 Encounters:  01/06/13 144/71  08/07/12 162/80  07/08/12 162/70     Admit date:  (Not on file) Last encounter with RMR:  07/26/2012   Allergy Amoxicillin and Sulfonamide derivatives  Current Outpatient Prescriptions  Medication Sig Dispense Refill  . amLODipine (NORVASC) 10 MG tablet TAKE 1 TABLET DAILY  90 tablet  2  . cloNIDine (CATAPRES - DOSED IN MG/24 HR) 0.2 mg/24hr patch Place 1 patch (0.2 mg total) onto the skin once a week.  12 patch  3  . fish oil-omega-3 fatty acids 1000 MG capsule Take 1 capsule by mouth daily.        Marland Kitchen losartan-hydrochlorothiazide (HYZAAR) 100-25 MG per tablet Take 1 tablet by mouth daily.        . metFORMIN (GLUCOPHAGE) 500 MG tablet Take 500 mg by mouth daily.        . Multiple Vitamins-Minerals (CENTRUM SILVER PO) Take 1 tablet by mouth daily.        . nebivolol (BYSTOLIC) 10 MG tablet Take 1 tablet (10 mg total) by mouth daily.  30 tablet  11  . silver sulfADIAZINE (SILVADENE) 1 % cream       . warfarin (COUMADIN) 2.5 MG tablet 2.5 mg daily except 3.75 mg on Wednesdays  135 tablet  0   No current facility-administered medications for this visit.    Discontinued Meds:    Medications Discontinued During This Encounter  Medication Reason  . doxycycline (VIBRAMYCIN) 100 MG capsule Error     Patient Active Problem List  Diagnosis  . Diabetes mellitus  . HYPERLIPIDEMIA  . HYPERTENSION  . Atrial fibrillation  . Arteriosclerotic cardiovascular disease (ASCVD)  . Chronic anticoagulation  . Peripheral vascular disease    LABS    Component Value Date/Time   NA 140 10/04/2009   NA 140 10/04/2009   NA 140 05/21/2007 0550   NA 137 05/20/2007 0941   K 4.5 10/04/2009   K 4.5 10/04/2009   K 4.0 DELTA CHECK NOTED 05/21/2007 0550   K 3.3* 05/20/2007 0941   CL 106 10/04/2009   CL 106 10/04/2009   CL 111 DELTA CHECK NOTED 05/21/2007 0550   CL 105 05/20/2007 0941   CO2 24 10/04/2009   CO2 24 10/04/2009   CO2 24 05/21/2007 0550   CO2 21 05/20/2007 0941   GLUCOSE 118 10/04/2009   GLUCOSE 118 10/04/2009   GLUCOSE 112* 05/21/2007 0550   GLUCOSE 130* 05/20/2007 0941   BUN 20 10/04/2009   BUN 20 10/04/2009   BUN 15 05/21/2007 0550   BUN 17 05/20/2007 0941   CREATININE 0.91 10/04/2009   CREATININE 0.91 10/04/2009  CREATININE 0.87 05/21/2007 0550   CREATININE 1.02 05/20/2007 0941   CALCIUM 8.7 10/04/2009   CALCIUM 8.7 10/04/2009   CALCIUM 7.8* 05/21/2007 0550   CALCIUM 8.0* 05/20/2007 0941   GFRNONAA >60 05/21/2007 0550   GFRNONAA >60 05/20/2007 0941   GFRAA  Value: >60        The eGFR has been calculated using the MDRD equation. This calculation has not been validated in all clinical 05/21/2007 0550   GFRAA  Value: >60        The eGFR has been calculated using the MDRD equation. This calculation has not been validated in all clinical 05/20/2007 0941   CMP     Component Value Date/Time   NA 140 10/04/2009   NA 140 10/04/2009   K 4.5 10/04/2009   K 4.5 10/04/2009   CL 106 10/04/2009   CL 106 10/04/2009   CO2 24 10/04/2009   CO2 24 10/04/2009   GLUCOSE 118 10/04/2009   GLUCOSE 118 10/04/2009   BUN 20 10/04/2009   BUN 20 10/04/2009   CREATININE 0.91 10/04/2009   CREATININE 0.91 10/04/2009   CALCIUM 8.7 10/04/2009   CALCIUM 8.7 10/04/2009   PROT 7.0 10/04/2009   PROT 7.0  10/04/2009   ALBUMIN 4.1 10/04/2009   ALBUMIN 4.1 10/04/2009   AST 16 10/04/2009   AST 16 10/04/2009   ALT 20 10/04/2009   ALT 20 10/04/2009   ALKPHOS 69 10/04/2009   ALKPHOS 69 10/04/2009   GFRNONAA >60 05/21/2007 0550   GFRAA  Value: >60        The eGFR has been calculated using the MDRD equation. This calculation has not been validated in all clinical 05/21/2007 0550       Component Value Date/Time   WBC 6.7 10/04/2009   WBC 6.7 10/04/2009   WBC 11.3* 05/21/2007 0550   WBC 11.7* 05/20/2007 0941   HGB 12.8 10/04/2009   HGB 4.26 10/04/2009   HGB 11.4* 05/21/2007 0550   HGB 13.3 05/20/2007 0941   HCT 39.6 10/04/2009   HCT 12.8 10/04/2009   HCT 33.9* 05/21/2007 0550   HCT 39.1 05/20/2007 0941   MCV 93.0 10/04/2009   MCV 39.6 10/04/2009   MCV 88.9 05/21/2007 0550   MCV 88.5 05/20/2007 0941    Lipid Panel     Component Value Date/Time   CHOL 112 10/04/2009   CHOL 293 10/04/2009   TRIG 113 10/04/2009   TRIG 113 10/04/2009   HDL 28 10/04/2009   HDL 28 10/04/2009   LDLCALC 61 10/04/2009   LDLCALC 61 10/04/2009    ABG No results found for this basename: phart, pco2, pco2art, po2, po2art, hco3, tco2, acidbasedef, o2sat     No results found for this basename: TSH   BNP (last 3 results) No results found for this basename: PROBNP,  in the last 8760 hours Cardiac Panel (last 3 results) No results found for this basename: CKTOTAL, CKMB, TROPONINI, RELINDX,  in the last 72 hours  Iron/TIBC/Ferritin No results found for this basename: iron, tibc, ferritin     EKG Orders placed in visit on 01/06/13  . EKG 12-LEAD     Prior Assessment and Plan Problem List as of 01/06/2013     ICD-9-CM   Diabetes mellitus   Last Assessment & Plan   07/08/2012 Office Visit Edited 07/08/2012 12:32 PM by Yehuda Savannah, MD     Patient has been treated over the past month or so for recurrent ulceration over the right forefoot.  This  is certainly at least partially related to peripheral  neuropathy, but peripheral vascular disease could be contributing as well.  Results of vascular testing were obtained from 2006 at which time ABIs were normal.  Decreased pulses at present suggest interval progression of disease.  Vascular study will be repeated.  Carotid bruit also noted suggesting the presence of cerebrovascular disease.  Carotid ultrasound will be performed.    HYPERLIPIDEMIA   Last Assessment & Plan   07/08/2012 Office Visit Written 07/08/2012 12:30 PM by Yehuda Savannah, MD     No recent assessment of serum lipids available.  Results will be sought from patient's PCP.    HYPERTENSION   Last Assessment & Plan   07/08/2012 Office Visit Written 07/08/2012 12:30 PM by Yehuda Savannah, MD     Blood pressure control is somewhat suboptimal.  Nevibolol will be increased to 10 mg per day.  Patient will monitor blood pressure and heart rate and reports significant abnormalities or any symptoms attributable to cerebral hypoperfusion.    Atrial fibrillation   Last Assessment & Plan   07/08/2012 Office Visit Edited 07/08/2012 12:26 PM by Yehuda Savannah, MD     Mr. Broadnax reports no symptoms attributable to atrial fibrillation.  Rate control is good at rest.  Risk of thromboembolism is very low with stable and therapeutic anticoagulation.    Arteriosclerotic cardiovascular disease (ASCVD)   Last Assessment & Plan   07/08/2012 Office Visit Written 07/08/2012 12:25 PM by Yehuda Savannah, MD     Patient remains asymptomatic with respect to coronary disease more than 15 years following CABG surgery.  We will continue our efforts to optimally control cardiovascular risk factors.    Chronic anticoagulation   Last Assessment & Plan   07/08/2012 Office Visit Written 07/08/2012 12:29 PM by Yehuda Savannah, MD     Patient has done very well with chronic anticoagulation.  More recent laboratory results are being sought from Dr. Karie Kirks.  We will continue to manage warfarin dosing.     Peripheral vascular disease       Imaging: No results found.

## 2013-01-07 ENCOUNTER — Other Ambulatory Visit: Payer: Self-pay | Admitting: Cardiology

## 2013-01-07 ENCOUNTER — Ambulatory Visit (HOSPITAL_COMMUNITY)
Admission: RE | Admit: 2013-01-07 | Discharge: 2013-01-07 | Disposition: A | Discharge status: Home / Self Care | Payer: Medicare Other | Source: Ambulatory Visit | Attending: Cardiology | Admitting: Cardiology

## 2013-01-07 DIAGNOSIS — R609 Edema, unspecified: Secondary | ICD-10-CM

## 2013-01-07 DIAGNOSIS — I10 Essential (primary) hypertension: Secondary | ICD-10-CM

## 2013-01-12 ENCOUNTER — Encounter: Payer: Self-pay | Admitting: Cardiology

## 2013-01-30 ENCOUNTER — Ambulatory Visit (INDEPENDENT_AMBULATORY_CARE_PROVIDER_SITE_OTHER): Payer: Medicare Other | Admitting: Adult Health

## 2013-01-30 ENCOUNTER — Ambulatory Visit: Payer: Medicare Other | Admitting: Cardiology

## 2013-01-30 ENCOUNTER — Encounter: Payer: Self-pay | Admitting: Adult Health

## 2013-01-30 VITALS — BP 134/60 | HR 57 | Ht 69.0 in | Wt 204.8 lb

## 2013-01-30 DIAGNOSIS — I10 Essential (primary) hypertension: Secondary | ICD-10-CM

## 2013-01-30 DIAGNOSIS — I4891 Unspecified atrial fibrillation: Secondary | ICD-10-CM

## 2013-01-30 NOTE — Progress Notes (Signed)
HPI [name] is a 77 year old patient of Dr. Lattie Haw we are following for ongoing assessment and management of CAD with multiple cardiovascular risk factors, atrial fibrillation, peripheral vascular disease.      He was last seen by Dr. Lattie Haw on 01/08/2013 with complaints of increased edema primarily in the right leg. Patient has a history of severe neuropathy related to diabetes but no history of DVT. At the time of his last appointment amlodipine dosage was decreased to 2.5 mg daily and clonidine was increased to 0 point 3 mg weekly. He is here for followup on blood pressure control and symptoms of edema.   He is feeling much better today, the edema is a laminated. Pressure is well-controlled. He is without complaints of incisional dizziness. He is very pleased that the edema has resolved.  Allergies  Allergen Reactions  . Amoxicillin   . Sulfonamide Derivatives     Current Outpatient Prescriptions  Medication Sig Dispense Refill  . amLODipine (NORVASC) 2.5 MG tablet Take 1 tablet (2.5 mg total) by mouth daily.  90 tablet  3  . cloNIDine (CATAPRES - DOSED IN MG/24 HR) 0.3 mg/24hr Place 1 patch (0.3 mg total) onto the skin once a week.  4 patch  12  . fish oil-omega-3 fatty acids 1000 MG capsule Take 1 capsule by mouth daily.        Marland Kitchen losartan-hydrochlorothiazide (HYZAAR) 100-25 MG per tablet Take 1 tablet by mouth daily.        . metFORMIN (GLUCOPHAGE) 500 MG tablet Take 500 mg by mouth daily.        . Multiple Vitamins-Minerals (CENTRUM SILVER PO) Take 1 tablet by mouth daily.        . nebivolol (BYSTOLIC) 10 MG tablet Take 1 tablet (10 mg total) by mouth daily.  30 tablet  11  . silver sulfADIAZINE (SILVADENE) 1 % cream       . warfarin (COUMADIN) 2.5 MG tablet 2.5 mg daily except 3.75 mg on Wednesdays  135 tablet  0   No current facility-administered medications for this visit.    Past Medical History  Diagnosis Date  . Hyperlipidemia   . Hypertension   . Arteriosclerotic  cardiovascular disease (ASCVD)     CABG in 1990s; 2002 total obstruction of LAD, CX and RCA with patent grafts and nl EF; Stress nuc. 2008 - mild LV dilation; normal EF; questionable small anteroapical scar; no ischemia  . Atrial fibrillation   . Squamous cell carcinoma     Left jaw, resected in 1990s  . Foot ulcer     Left with associated osteomyelitis; left leg cellulitis in 2008;  . Tibia fracture     Left  . Pedal edema   . Chronic anticoagulation     Past Surgical History  Procedure Laterality Date  . Coronary artery bypass graft  1990's  . Squamous cell carcinoma excision  1993    Left lower face  . Tonsillectomy    . Orif tibia fracture      left    ROS: PHYSICAL EXAM BP 134/60  Pulse 57  Ht 5' 9"  (1.753 m)  Wt 204 lb 12.8 oz (92.897 kg)  BMI 30.23 kg/m2  General: Well developed, well nourished, in no acute distress Head: Eyes PERRLA, No xanthomas.   Normal cephalic and atramatic  Lungs: Clear bilaterally to auscultation and percussion. Heart: HRRR S1 S2, without MRG.  Pulses are 2+ & equal.  No carotid bruit. No JVD.  No abdominal bruits. No femoral bruits. Abdomen: Bowel sounds are positive, abdomen soft and non-tender without masses or                  Hernia's noted. Msk:  Back normal, normal gait. Normal strength and tone for age. Extremities: No clubbing, cyanosis or edema. Chronic redness in the lower extremities, bilateral  DP +1 Neuro: Alert and oriented X 3. Psych:  Good affect, responds appropriately   ASSESSMENT AND PLAN

## 2013-01-30 NOTE — Assessment & Plan Note (Signed)
Excellent control of blood pressure with medication adjustments. He has no further lower extremity edema. He is feeling much better and is tolerating the increased dose of clonidine. We will continue him on current medication regimen, plenty of refills have been provided. He will follow with Dr. Lattie Haw in 3 months unless he becomes symptomatic.

## 2013-01-30 NOTE — Patient Instructions (Signed)
Your physician recommends that you continue on your current medications as directed. Please refer to the Current Medication list given to you today.  Follow-up with Inez Catalina in 3 months

## 2013-01-30 NOTE — Assessment & Plan Note (Signed)
Heart rate is well-controlled. No further changes in medication regimen are planned. He will continue on Coumadin therapy with PT/INR checks and dosage adjustments per Coumadin clinic.

## 2013-02-12 ENCOUNTER — Ambulatory Visit (INDEPENDENT_AMBULATORY_CARE_PROVIDER_SITE_OTHER): Payer: Medicare Other | Admitting: *Deleted

## 2013-02-12 DIAGNOSIS — I4891 Unspecified atrial fibrillation: Secondary | ICD-10-CM

## 2013-02-12 DIAGNOSIS — Z7901 Long term (current) use of anticoagulants: Secondary | ICD-10-CM

## 2013-02-12 LAB — POCT INR: INR: 2.2

## 2013-02-24 ENCOUNTER — Ambulatory Visit (INDEPENDENT_AMBULATORY_CARE_PROVIDER_SITE_OTHER): Payer: Medicare Other | Admitting: *Deleted

## 2013-02-24 DIAGNOSIS — Z7901 Long term (current) use of anticoagulants: Secondary | ICD-10-CM

## 2013-02-26 DIAGNOSIS — Z7901 Long term (current) use of anticoagulants: Secondary | ICD-10-CM

## 2013-02-26 LAB — POC HEMOCCULT BLD/STL (HOME/3-CARD/SCREEN): Card #2 Fecal Occult Blod, POC: NEGATIVE

## 2013-02-28 ENCOUNTER — Encounter: Payer: Self-pay | Admitting: Cardiology

## 2013-03-03 ENCOUNTER — Other Ambulatory Visit: Payer: Self-pay | Admitting: *Deleted

## 2013-03-07 ENCOUNTER — Encounter: Payer: Self-pay | Admitting: *Deleted

## 2013-03-25 ENCOUNTER — Ambulatory Visit (INDEPENDENT_AMBULATORY_CARE_PROVIDER_SITE_OTHER): Payer: Medicare Other | Admitting: *Deleted

## 2013-03-25 DIAGNOSIS — Z7901 Long term (current) use of anticoagulants: Secondary | ICD-10-CM

## 2013-03-25 LAB — POC HEMOCCULT BLD/STL (HOME/3-CARD/SCREEN)
Card #3 Fecal Occult Blood, POC: NEGATIVE
Fecal Occult Blood, POC: NEGATIVE

## 2013-03-26 ENCOUNTER — Ambulatory Visit (INDEPENDENT_AMBULATORY_CARE_PROVIDER_SITE_OTHER): Payer: Medicare Other | Admitting: *Deleted

## 2013-03-26 DIAGNOSIS — Z7901 Long term (current) use of anticoagulants: Secondary | ICD-10-CM

## 2013-03-26 DIAGNOSIS — I4891 Unspecified atrial fibrillation: Secondary | ICD-10-CM

## 2013-03-26 LAB — POCT INR: INR: 2.6

## 2013-03-27 ENCOUNTER — Encounter: Payer: Self-pay | Admitting: *Deleted

## 2013-04-09 ENCOUNTER — Ambulatory Visit (INDEPENDENT_AMBULATORY_CARE_PROVIDER_SITE_OTHER): Payer: Medicare Other | Admitting: *Deleted

## 2013-04-09 DIAGNOSIS — Z7901 Long term (current) use of anticoagulants: Secondary | ICD-10-CM

## 2013-04-09 DIAGNOSIS — I4891 Unspecified atrial fibrillation: Secondary | ICD-10-CM

## 2013-04-09 LAB — POCT INR: INR: 3.1

## 2013-04-30 ENCOUNTER — Ambulatory Visit (INDEPENDENT_AMBULATORY_CARE_PROVIDER_SITE_OTHER): Payer: Medicare Other | Admitting: *Deleted

## 2013-04-30 DIAGNOSIS — Z7901 Long term (current) use of anticoagulants: Secondary | ICD-10-CM

## 2013-04-30 DIAGNOSIS — I4891 Unspecified atrial fibrillation: Secondary | ICD-10-CM

## 2013-04-30 LAB — POCT INR: INR: 2.4

## 2013-05-06 ENCOUNTER — Ambulatory Visit (INDEPENDENT_AMBULATORY_CARE_PROVIDER_SITE_OTHER): Payer: Medicare Other | Admitting: Cardiology

## 2013-05-06 ENCOUNTER — Encounter: Payer: Self-pay | Admitting: Cardiology

## 2013-05-06 VITALS — BP 186/90 | HR 62 | Ht 69.0 in | Wt 216.5 lb

## 2013-05-06 DIAGNOSIS — I4891 Unspecified atrial fibrillation: Secondary | ICD-10-CM

## 2013-05-06 DIAGNOSIS — I7090 Unspecified atherosclerosis: Secondary | ICD-10-CM

## 2013-05-06 DIAGNOSIS — Z7901 Long term (current) use of anticoagulants: Secondary | ICD-10-CM

## 2013-05-06 DIAGNOSIS — I10 Essential (primary) hypertension: Secondary | ICD-10-CM

## 2013-05-06 DIAGNOSIS — I2510 Atherosclerotic heart disease of native coronary artery without angina pectoris: Secondary | ICD-10-CM

## 2013-05-06 MED ORDER — SPIRONOLACTONE 25 MG PO TABS: 25.0000 mg | ORAL_TABLET | Freq: Every day | ORAL | Status: DC

## 2013-05-06 NOTE — Progress Notes (Signed)
Patient ID: [name], male   DOB: July 16, 1936, 77 y.o.   MRN: [id number]  HPI: Scheduled return visit for this pleasant gentleman with atrial fibrillation requiring anticoagulation, multiple cardiovascular risk factors including diabetes, hypertension and hyperlipidemia and known coronary and peripheral vascular disease. Despite multiple medical issues as described above, he does well symptomatically with only class II dyspnea on exertion. Activity level is relatively good. He does not carefully monitor blood pressure, but has had some elevated systolics in the 370-964 range. Blood pressure tends to be higher when taken in medical settings. INR has been stable and therapeutic.  For the past 3 weeks, patient has noted erythema and tenderness of his legs with swelling. He has been treated with a prolonged course of cephalosporins with some, but incomplete, improvement.  Mr. Messman reports recent evaluation by Dr. Karie Kirks including laboratory testing and treatment with antibiotics for a cellulitis of the lower extremities.  Current Outpatient Prescriptions  Medication Sig Dispense Refill  . amLODipine (NORVASC) 2.5 MG tablet Take 1 tablet (2.5 mg total) by mouth daily.  90 tablet  3  . cephALEXin (KEFLEX) 500 MG capsule       . cloNIDine (CATAPRES - DOSED IN MG/24 HR) 0.3 mg/24hr Place 1 patch (0.3 mg total) onto the skin once a week.  4 patch  12  . fish oil-omega-3 fatty acids 1000 MG capsule Take 1 capsule by mouth daily.        Marland Kitchen losartan-hydrochlorothiazide (HYZAAR) 100-25 MG per tablet Take 1 tablet by mouth daily.        . metFORMIN (GLUCOPHAGE) 500 MG tablet Take 500 mg by mouth daily.        . Multiple Vitamins-Minerals (CENTRUM SILVER PO) Take 1 tablet by mouth daily.        . nebivolol (BYSTOLIC) 10 MG tablet Take 1 tablet (10 mg total) by mouth daily.  30 tablet  11  . silver sulfADIAZINE (SILVADENE) 1 % cream       . spironolactone (ALDACTONE) 25 MG tablet Take 1 tablet (25 mg total)  by mouth daily.  90 tablet  1  . warfarin (COUMADIN) 2.5 MG tablet TAKE 1 TABLET (2.5 MG) DAILY EXCEPT ON WEDNESDAYS TAKE ONE AND ONE-HALF TABLETS (3.75 MG)  135 tablet  0   No current facility-administered medications for this visit.   Allergies  Allergen Reactions  . Amoxicillin   . Sulfonamide Derivatives      Past medical history, social history, and family history reviewed and updated.  ROS: Denies orthopnea, PND, lightheadedness or syncope. All other systems reviewed and are negative.  PHYSICAL EXAM: BP 186/90  Pulse 62  Ht 5' 9"  (1.753 m)  Wt 98.204 kg (216 lb 8 oz)  BMI 31.96 kg/m2;  Body mass index is 31.96 kg/(m^2). General-Well developed; no acute distress Body habitus-proportionate weight and height Neck-No JVD; no carotid bruits Lungs-clear lung fields; resonant to percussion Cardiovascular-normal PMI; normal S1 and S2; irregular rhythm Abdomen-normal bowel sounds; soft and non-tender without masses or organomegaly Musculoskeletal-No deformities, no cyanosis or clubbing Neurologic-Normal cranial nerves; symmetric strength and tone Skin-Warm, erythema over the anterior aspect of both legs below the knee Extremities-distal pulses intact; no edema  Jacqulyn Ducking, MD 05/06/2013  2:31 PM  ASSESSMENT AND PLAN

## 2013-05-06 NOTE — Patient Instructions (Addendum)
Your physician recommends that you schedule a follow-up appointment in: 3 MONTHS WITH Dr. Bronson Ing NURSE VISIT IN ONE MONTH TO RE-CHECK YOUR BLOOD PRESSURE   Your physician has requested that you regularly monitor and record your blood pressure readings at home. Please use the same machine at the same time of day to check your readings and record them to bring to your follow-up visit.BRING WITH YOU TO YOUR NURSE VISIT   Your physician has recommended you make the following change in your medication:   1) START ALDACTONE 25MG ONCE DAILY  Your physician recommends that you return for lab work in: [place]  (slips given for BMET) Your physician recommends that you have follow up lab work, we will mail you a reminder letter to alert you when to go [employer], located across the street from our office.   Your physician recommends THAT YOU CALL OUR OFFICE IN THREE WEEKS TO SCHEDULE AN APPOINTMENT WITH A DERMATOLOGIST IF YOUR LEG IS NOT BETTER [phone]

## 2013-05-06 NOTE — Progress Notes (Deleted)
Name: [name]    DOB: 1936/04/13  Age: 77 y.o.  MR#: [id number]       PCP:  Robert Bellow, MD      Insurance: Payor: MEDICARE / Plan: MEDICARE PART A AND B / Product Type: *No Product type* /   CC:   No chief complaint on file.  BOTTLES VS Filed Vitals:   05/06/13 1320  BP: 186/90  Pulse: 62  Height: 5' 9"  (1.753 m)  Weight: 216 lb 8 oz (98.204 kg)    Weights Current Weight  05/06/13 216 lb 8 oz (98.204 kg)  01/30/13 204 lb 12.8 oz (92.897 kg)  01/06/13 210 lb 4 oz (95.369 kg)    Blood Pressure  BP Readings from Last 3 Encounters:  05/06/13 186/90  01/30/13 134/60  01/06/13 144/71     Admit date:  (Not on file) Last encounter with RMR:  01/30/2013   Allergy Amoxicillin and Sulfonamide derivatives  Current Outpatient Prescriptions  Medication Sig Dispense Refill  . amLODipine (NORVASC) 2.5 MG tablet Take 1 tablet (2.5 mg total) by mouth daily.  90 tablet  3  . cephALEXin (KEFLEX) 500 MG capsule       . cloNIDine (CATAPRES - DOSED IN MG/24 HR) 0.3 mg/24hr Place 1 patch (0.3 mg total) onto the skin once a week.  4 patch  12  . fish oil-omega-3 fatty acids 1000 MG capsule Take 1 capsule by mouth daily.        Marland Kitchen losartan-hydrochlorothiazide (HYZAAR) 100-25 MG per tablet Take 1 tablet by mouth daily.        . metFORMIN (GLUCOPHAGE) 500 MG tablet Take 500 mg by mouth daily.        . Multiple Vitamins-Minerals (CENTRUM SILVER PO) Take 1 tablet by mouth daily.        . nebivolol (BYSTOLIC) 10 MG tablet Take 1 tablet (10 mg total) by mouth daily.  30 tablet  11  . silver sulfADIAZINE (SILVADENE) 1 % cream       . warfarin (COUMADIN) 2.5 MG tablet 2.5 mg daily except 3.75 mg on Wednesdays  135 tablet  0   No current facility-administered medications for this visit.    Discontinued Meds:   There are no discontinued medications.  Patient Active Problem List   Diagnosis Date Noted  . Peripheral vascular disease 08/06/2012  . Chronic anticoagulation 01/20/2011  .  Diabetes mellitus 03/04/2010  . HYPERLIPIDEMIA 03/04/2010  . HYPERTENSION 03/04/2010  . Arteriosclerotic cardiovascular disease (ASCVD) 03/04/2010  . Atrial fibrillation 04/08/2009    LABS    Component Value Date/Time   NA 140 10/04/2009   NA 140 10/04/2009   NA 140 05/21/2007 0550   NA 137 05/20/2007 0941   K 4.5 10/04/2009   K 4.5 10/04/2009   K 4.0 DELTA CHECK NOTED 05/21/2007 0550   K 3.3* 05/20/2007 0941   CL 106 10/04/2009   CL 106 10/04/2009   CL 111 DELTA CHECK NOTED 05/21/2007 0550   CL 105 05/20/2007 0941   CO2 24 10/04/2009   CO2 24 10/04/2009   CO2 24 05/21/2007 0550   CO2 21 05/20/2007 0941   GLUCOSE 118 10/04/2009   GLUCOSE 118 10/04/2009   GLUCOSE 112* 05/21/2007 0550   GLUCOSE 130* 05/20/2007 0941   BUN 20 10/04/2009   BUN 20 10/04/2009   BUN 15 05/21/2007 0550   BUN 17 05/20/2007 0941   CREATININE 0.91 10/04/2009   CREATININE 0.91 10/04/2009   CREATININE 0.87 05/21/2007 0550   CREATININE  1.02 05/20/2007 0941   CALCIUM 8.7 10/04/2009   CALCIUM 8.7 10/04/2009   CALCIUM 7.8* 05/21/2007 0550   CALCIUM 8.0* 05/20/2007 0941   GFRNONAA >60 05/21/2007 0550   GFRNONAA >60 05/20/2007 0941   GFRAA  Value: >60        The eGFR has been calculated using the MDRD equation. This calculation has not been validated in all clinical 05/21/2007 0550   GFRAA  Value: >60        The eGFR has been calculated using the MDRD equation. This calculation has not been validated in all clinical 05/20/2007 0941   CMP     Component Value Date/Time   NA 140 10/04/2009   NA 140 10/04/2009   K 4.5 10/04/2009   K 4.5 10/04/2009   CL 106 10/04/2009   CL 106 10/04/2009   CO2 24 10/04/2009   CO2 24 10/04/2009   GLUCOSE 118 10/04/2009   GLUCOSE 118 10/04/2009   BUN 20 10/04/2009   BUN 20 10/04/2009   CREATININE 0.91 10/04/2009   CREATININE 0.91 10/04/2009   CALCIUM 8.7 10/04/2009   CALCIUM 8.7 10/04/2009   PROT 7.0 10/04/2009   PROT 7.0 10/04/2009   ALBUMIN 4.1 10/04/2009   ALBUMIN 4.1  10/04/2009   AST 16 10/04/2009   AST 16 10/04/2009   ALT 20 10/04/2009   ALT 20 10/04/2009   ALKPHOS 69 10/04/2009   ALKPHOS 69 10/04/2009   GFRNONAA >60 05/21/2007 0550   GFRAA  Value: >60        The eGFR has been calculated using the MDRD equation. This calculation has not been validated in all clinical 05/21/2007 0550       Component Value Date/Time   WBC 6.7 10/04/2009   WBC 6.7 10/04/2009   WBC 11.3* 05/21/2007 0550   WBC 11.7* 05/20/2007 0941   HGB 12.8 10/04/2009   HGB 4.26 10/04/2009   HGB 11.4* 05/21/2007 0550   HGB 13.3 05/20/2007 0941   HCT 39.6 10/04/2009   HCT 12.8 10/04/2009   HCT 33.9* 05/21/2007 0550   HCT 39.1 05/20/2007 0941   MCV 93.0 10/04/2009   MCV 39.6 10/04/2009   MCV 88.9 05/21/2007 0550   MCV 88.5 05/20/2007 0941    Lipid Panel     Component Value Date/Time   CHOL 112 10/04/2009   CHOL 293 10/04/2009   TRIG 113 10/04/2009   TRIG 113 10/04/2009   HDL 28 10/04/2009   HDL 28 10/04/2009   LDLCALC 61 10/04/2009   LDLCALC 61 10/04/2009    ABG No results found for this basename: phart, pco2, pco2art, po2, po2art, hco3, tco2, acidbasedef, o2sat     No results found for this basename: TSH   BNP (last 3 results) No results found for this basename: PROBNP,  in the last 8760 hours Cardiac Panel (last 3 results) No results found for this basename: CKTOTAL, CKMB, TROPONINI, RELINDX,  in the last 72 hours  Iron/TIBC/Ferritin No results found for this basename: iron, tibc, ferritin     EKG Orders placed in visit on 01/06/13  . EKG 12-LEAD     Prior Assessment and Plan Problem List as of 05/06/2013   Diabetes mellitus   Last Assessment & Plan   01/06/2013 Office Visit Written 01/06/2013 12:26 PM by Yehuda Savannah, MD     Patient reports recent laboratory studies performed by Dr. Karie Kirks. These results have been requested.    HYPERLIPIDEMIA   Last Assessment & Plan   01/06/2013 Office Visit Written 01/06/2013  12:27 PM by Yehuda Savannah, MD      Office values obtained by Dr. Karie Kirks will be reviewed and therapy adjusted if necessary.    HYPERTENSION   Last Assessment & Plan   01/30/2013 Office Visit Written 01/30/2013  3:54 PM by Lendon Colonel, NP     Excellent control of blood pressure with medication adjustments. He has no further lower extremity edema. He is feeling much better and is tolerating the increased dose of clonidine. We will continue him on current medication regimen, plenty of refills have been provided. He will follow with Dr. Lattie Haw in 3 months unless he becomes symptomatic.    Atrial fibrillation   Last Assessment & Plan   01/30/2013 Office Visit Written 01/30/2013  3:55 PM by Lendon Colonel, NP     Heart rate is well-controlled. No further changes in medication regimen are planned. He will continue on Coumadin therapy with PT/INR checks and dosage adjustments per Coumadin clinic.    Arteriosclerotic cardiovascular disease (ASCVD)   Last Assessment & Plan   01/06/2013 Office Visit Written 01/06/2013 12:24 PM by Yehuda Savannah, MD     Patient continues to do extremely well from a cardiovascular standpoint with no symptoms to suggest recurrent myocardial ischemia and excellent control of cardiovascular risk factors.    Chronic anticoagulation   Last Assessment & Plan   01/06/2013 Office Visit Written 01/06/2013 12:26 PM by Yehuda Savannah, MD     Anticoagulation has been stable and therapeutic without apparent complications.  Patient cannot recall the year of his most recent colonoscopy, but is certain that it is within the past 10 years and that no lesions were identified.  A recent CBC will be requested from Dr. Vickey Sages office and stool for Hemoccult testing obtained.    Peripheral vascular disease   Last Assessment & Plan   01/06/2013 Office Visit Edited 01/08/2013  9:57 AM by Yehuda Savannah, MD     Patient has atherosclerotic disease but no definite symptoms or problems related to that. Consultation  with a vascular expert will be deferred. He has new edema, primarily in the right leg. Amlodipine dosage will be reduced markedly and venous ultrasound performed to exclude DVT. Salt restriction and leg elevation will also be emphasized. I will reassess his edema in 3 weeks.        Imaging: No results found.

## 2013-05-07 LAB — BASIC METABOLIC PANEL
Calcium: 9.3 mg/dL (ref 8.4–10.5)
Creat: 0.9 mg/dL (ref 0.50–1.35)
Sodium: 134 mEq/L — ABNORMAL LOW (ref 135–145)

## 2013-05-11 ENCOUNTER — Other Ambulatory Visit: Payer: Self-pay | Admitting: Cardiology

## 2013-05-12 ENCOUNTER — Encounter: Payer: Self-pay | Admitting: *Deleted

## 2013-05-12 NOTE — Telephone Encounter (Signed)
Medication sent via escribe for coumadin

## 2013-05-19 NOTE — Assessment & Plan Note (Signed)
Adequate control of heart rate. Current strategy of treatment with anticoagulants and AV nodal blocking agents will be continued.

## 2013-05-19 NOTE — Assessment & Plan Note (Addendum)
Blood pressure control has been suboptimal. Spironolactone added to medical regime with close monitoring of electrolytes and renal function.   Recommendation to the patient that he obtain blood pressures at home and pay closer attention to adjustment of medications to adequately control hypertension.  No recent laboratory test results available. We will seek records from Dr. Karie Kirks.

## 2013-05-19 NOTE — Assessment & Plan Note (Signed)
Patient has had a very benign course in the 20 years since he underwent CABG surgery.  We will continue our attempts to optimally control cardiovascular risk factors.

## 2013-05-19 NOTE — Assessment & Plan Note (Signed)
Patient has tolerated warfarin to date. We will continue to monitor for occult GI blood loss.

## 2013-05-21 ENCOUNTER — Ambulatory Visit (INDEPENDENT_AMBULATORY_CARE_PROVIDER_SITE_OTHER): Payer: Medicare Other | Admitting: *Deleted

## 2013-05-21 ENCOUNTER — Encounter: Payer: Self-pay | Admitting: Cardiology

## 2013-05-21 DIAGNOSIS — Z7901 Long term (current) use of anticoagulants: Secondary | ICD-10-CM

## 2013-05-21 DIAGNOSIS — I4891 Unspecified atrial fibrillation: Secondary | ICD-10-CM

## 2013-05-21 LAB — POCT INR: INR: 2.5

## 2013-05-28 ENCOUNTER — Other Ambulatory Visit: Payer: Self-pay | Admitting: *Deleted

## 2013-05-28 ENCOUNTER — Encounter: Payer: Self-pay | Admitting: *Deleted

## 2013-05-28 DIAGNOSIS — I10 Essential (primary) hypertension: Secondary | ICD-10-CM

## 2013-06-09 ENCOUNTER — Encounter: Payer: Self-pay | Admitting: Cardiology

## 2013-06-09 ENCOUNTER — Ambulatory Visit (INDEPENDENT_AMBULATORY_CARE_PROVIDER_SITE_OTHER): Payer: Medicare Other | Admitting: *Deleted

## 2013-06-09 VITALS — BP 146/88 | HR 62 | Ht 69.0 in | Wt 209.0 lb

## 2013-06-09 DIAGNOSIS — I10 Essential (primary) hypertension: Secondary | ICD-10-CM

## 2013-06-09 NOTE — Patient Instructions (Addendum)
Your physician recommends that you schedule a follow-up appointment in: TO BE DETERMINED   

## 2013-06-09 NOTE — Progress Notes (Signed)
Pt presented to nurse visit today TO RECHECK BP FROM MEDICATION CHANGES, PT TO BRING IN BP READINGS PER LEFT AT HOME Pt voiced no complaints today Please advise if any further instructions are neccessary   Joelene Millin A. Aayra Hornbaker L.P.N.  Last OV D/C Summary and VS:   BP 186/90  Pulse 62  Ht 5' 9"  (1.753 m)  Wt 98.204 kg (216 lb 8 oz)  BMI 31.96 kg/m2;   Your physician recommends that you schedule a follow-up appointment in: 3 MONTHS WITH Dr. Bronson Ing NURSE VISIT IN ONE MONTH TO RE-CHECK YOUR BLOOD PRESSURE  Your physician has requested that you regularly monitor and record your blood pressure readings at home. Please use the same machine at the same time of day to check your readings and record them to bring to your follow-up visit.BRING WITH YOU TO YOUR NURSE VISIT  Your physician has recommended you make the following change in your medication:  1) START ALDACTONE 25MG ONCE DAILY  Your physician recommends that you return for lab work in: [place] (slips given for BMET) Your physician recommends that you have follow up lab work, we will mail you a reminder letter to alert you when to go [facility], located across the street from our office.  Your physician recommends THAT YOU CALL OUR OFFICE IN THREE WEEKS TO SCHEDULE AN APPOINTMENT WITH A DERMATOLOGIST IF YOUR LEG IS NOT BETTER [phone]

## 2013-06-10 ENCOUNTER — Encounter: Payer: Self-pay | Admitting: *Deleted

## 2013-06-10 LAB — BASIC METABOLIC PANEL
BUN: 24 mg/dL — ABNORMAL HIGH (ref 6–23)
Chloride: 103 mEq/L (ref 96–112)
Potassium: 4.6 mEq/L (ref 3.5–5.3)
Sodium: 134 mEq/L — ABNORMAL LOW (ref 135–145)

## 2013-06-10 NOTE — Progress Notes (Signed)
Patient ID: [name], male   DOB: [birth year], 77 y.o.   MRN: [id number]  Adequate, if not optimal, blood pressure control.  Increase amlodipine to 5 mg per day. Continue home blood pressure checks and return with next visit.

## 2013-06-11 MED ORDER — AMLODIPINE BESYLATE 5 MG PO TABS: 5.0000 mg | ORAL_TABLET | Freq: Every day | ORAL | Status: DC

## 2013-06-11 NOTE — Progress Notes (Signed)
.  left message to have patient return my call.  

## 2013-06-11 NOTE — Progress Notes (Signed)
Spoke to pt to advise results/instructions. Pt understood. rx sent to pharmacy by e-script for increased dosage

## 2013-07-02 ENCOUNTER — Ambulatory Visit (INDEPENDENT_AMBULATORY_CARE_PROVIDER_SITE_OTHER): Payer: Medicare Other | Admitting: *Deleted

## 2013-07-02 DIAGNOSIS — Z7901 Long term (current) use of anticoagulants: Secondary | ICD-10-CM

## 2013-07-02 DIAGNOSIS — I4891 Unspecified atrial fibrillation: Secondary | ICD-10-CM

## 2013-07-02 LAB — POCT INR: INR: 2.7

## 2013-07-13 ENCOUNTER — Other Ambulatory Visit: Payer: Self-pay | Admitting: Cardiology

## 2013-08-11 ENCOUNTER — Ambulatory Visit (INDEPENDENT_AMBULATORY_CARE_PROVIDER_SITE_OTHER): Payer: Medicare Other | Admitting: *Deleted

## 2013-08-11 ENCOUNTER — Encounter: Payer: Self-pay | Admitting: Cardiovascular Disease

## 2013-08-11 ENCOUNTER — Ambulatory Visit (INDEPENDENT_AMBULATORY_CARE_PROVIDER_SITE_OTHER): Payer: Medicare Other | Admitting: Cardiovascular Disease

## 2013-08-11 VITALS — BP 138/90 | HR 62 | Ht 69.5 in | Wt 214.8 lb

## 2013-08-11 DIAGNOSIS — I4891 Unspecified atrial fibrillation: Secondary | ICD-10-CM

## 2013-08-11 DIAGNOSIS — Z951 Presence of aortocoronary bypass graft: Secondary | ICD-10-CM

## 2013-08-11 DIAGNOSIS — Z7182 Exercise counseling: Secondary | ICD-10-CM

## 2013-08-11 DIAGNOSIS — I10 Essential (primary) hypertension: Secondary | ICD-10-CM

## 2013-08-11 DIAGNOSIS — Z7901 Long term (current) use of anticoagulants: Secondary | ICD-10-CM

## 2013-08-11 DIAGNOSIS — I2510 Atherosclerotic heart disease of native coronary artery without angina pectoris: Secondary | ICD-10-CM

## 2013-08-11 DIAGNOSIS — E119 Type 2 diabetes mellitus without complications: Secondary | ICD-10-CM

## 2013-08-11 DIAGNOSIS — E785 Hyperlipidemia, unspecified: Secondary | ICD-10-CM

## 2013-08-11 DIAGNOSIS — E782 Mixed hyperlipidemia: Secondary | ICD-10-CM

## 2013-08-11 DIAGNOSIS — I739 Peripheral vascular disease, unspecified: Secondary | ICD-10-CM

## 2013-08-11 DIAGNOSIS — I7090 Unspecified atherosclerosis: Secondary | ICD-10-CM

## 2013-08-11 LAB — POCT INR: INR: 2.5

## 2013-08-11 MED ORDER — SIMVASTATIN 20 MG PO TABS: 20.0000 mg | ORAL_TABLET | Freq: Every day | ORAL | Status: DC

## 2013-08-11 MED ORDER — ATORVASTATIN CALCIUM 20 MG PO TABS: 20.0000 mg | ORAL_TABLET | Freq: Every day | ORAL | Status: DC

## 2013-08-11 NOTE — Progress Notes (Signed)
Patient ID: [name], male   DOB: 1936/01/14, 77 y.o.   MRN: [id number]      SUBJECTIVE: [name] is a 77 yr old male with atrial fibrillation requiring anticoagulation, multiple cardiovascular risk factors including diabetes, hypertension and hyperlipidemia and known coronary and peripheral vascular disease, with a h/o CABG and MI in his 48's.  He used to be very active playing baseball and lifting weights, but he tells me his podiatrist told him to stop walking. The patient denies any symptoms of chest pain, palpitations, shortness of breath, lightheadedness, dizziness, leg swelling, orthopnea, PND, and syncope.  He has an exercise bike at home. He doesn't recall ever being on a cholesterol-lowering medication, and denies bleeding problems.     Allergies  Allergen Reactions  . Amoxicillin   . Sulfonamide Derivatives     Current Outpatient Prescriptions  Medication Sig Dispense Refill  . amLODipine (NORVASC) 5 MG tablet Take 1 tablet (5 mg total) by mouth daily.  90 tablet  3  . BYSTOLIC 10 MG tablet TAKE ONE TABLET BY MOUTH EVERY DAY  30 tablet  3  . cephALEXin (KEFLEX) 500 MG capsule       . cloNIDine (CATAPRES - DOSED IN MG/24 HR) 0.3 mg/24hr Place 1 patch (0.3 mg total) onto the skin once a week.  4 patch  12  . fish oil-omega-3 fatty acids 1000 MG capsule Take 1 capsule by mouth daily.        Marland Kitchen losartan-hydrochlorothiazide (HYZAAR) 100-25 MG per tablet Take 1 tablet by mouth daily.        . metFORMIN (GLUCOPHAGE) 500 MG tablet Take 500 mg by mouth daily.        . Multiple Vitamins-Minerals (CENTRUM SILVER PO) Take 1 tablet by mouth daily.        . silver sulfADIAZINE (SILVADENE) 1 % cream       . spironolactone (ALDACTONE) 25 MG tablet Take 1 tablet (25 mg total) by mouth daily.  90 tablet  1  . warfarin (COUMADIN) 2.5 MG tablet TAKE 1 TABLET (2.5 MG) DAILY EXCEPT ON WEDNESDAYS TAKE ONE AND ONE-HALF TABLETS (3.75 MG)  135 tablet  0   No current facility-administered  medications for this visit.    Past Medical History  Diagnosis Date  . Hyperlipidemia   . Hypertension   . Arteriosclerotic cardiovascular disease (ASCVD)     CABG in 1990s; 2002 total obstruction of LAD, CX and RCA with patent grafts and nl EF; Stress nuc. 2008 - mild LV dilation; normal EF; questionable small anteroapical scar; no ischemia  . Atrial fibrillation   . Squamous cell carcinoma     Left jaw, resected in 1990s  . Foot ulcer     Left with associated osteomyelitis; left leg cellulitis in 2008;  . Tibia fracture     Left  . Pedal edema   . Chronic anticoagulation     Past Surgical History  Procedure Laterality Date  . Coronary artery bypass graft  1990's  . Squamous cell carcinoma excision  1993    Left lower face  . Tonsillectomy    . Orif tibia fracture      left    History   Social History  . Marital Status: Married    Spouse Name: N/A    Number of Children: N/A  . Years of Education: N/A   Occupational History  . Not on file.   Social History Main Topics  . Smoking status: Never Smoker   .  Smokeless tobacco: Not on file  . Alcohol Use: No  . Drug Use: No  . Sexual Activity: Not on file   Other Topics Concern  . Not on file   Social History Narrative  . No narrative on file     Filed Vitals:   08/11/13 0817  BP: 138/90  Pulse: 62  Height: 5' 9.5" (1.765 m)  Weight: 214 lb 12 oz (97.41 kg)    PHYSICAL EXAM General: NAD Neck: No JVD, no thyromegaly or thyroid nodule.  Lungs: Clear to auscultation bilaterally with normal respiratory effort. CV: Nondisplaced PMI.  Heart irregular, normal rate, normal S1/S2, no S3/S4, no murmur.  No peripheral edema.  No carotid bruit.  Normal pedal pulses.  Abdomen: Soft, nontender, no hepatosplenomegaly, no distention.  Neurologic: Alert and oriented x 3.  Psych: Normal affect. Extremities: No clubbing or cyanosis. Erythematous bilaterally.  ECG: reviewed and available in electronic  records.      ASSESSMENT AND PLAN: 1. CAD: on Bystolic, but not on ASA or a statin, and will thus institute ASA 81 mg daily and Lipitor 20 mg daily to mitigate his cardiovascular risk factors, given his concomitant h/o diabetes mellitus. 2. Atrial fibrillation: rate controlled, on warfarin. 3. HTN: controlled on current therapy, which includes amlodipine, spironolactone, and Hyzaar. 4. Hyperlipidemia: initiate statin therapy with Lipitor 20 mg daily. Check lipids and LFT's in 3 months. 5. PVD: initiate ASA 81 mg daily and Lipitor as noted above. Has intermittent claudication. 6. Gave extensive counseling on the benefits of aerobic exercise and moderate strength training.  Dispo: f/u 6 months.   Kate Sable, M.D., F.A.C.C.

## 2013-08-11 NOTE — Patient Instructions (Addendum)
Your physician recommends that you schedule a follow-up appointment in: 6 months  Your physician has recommended you make the following change in your medication:   1) START ASPIRIN 81MG ONCE DAILY 2) START SIMVASTATIN 20MG DAILY  Your physician recommends that you return for lab work in: [place] recommends that you have follow up lab work, we will mail you a reminder letter to alert you when to go [employer], located across the street from our office.

## 2013-08-20 ENCOUNTER — Other Ambulatory Visit: Payer: Self-pay | Admitting: Cardiology

## 2013-09-15 ENCOUNTER — Telehealth: Payer: Self-pay | Admitting: *Deleted

## 2013-09-15 MED ORDER — NEBIVOLOL HCL 10 MG PO TABS: ORAL_TABLET | ORAL | Status: DC

## 2013-09-15 NOTE — Telephone Encounter (Signed)
rx refill from [facility] [phone] Wanting to switch pt to bystolic 10 mg #01 2 tabs a day

## 2013-09-22 ENCOUNTER — Ambulatory Visit (INDEPENDENT_AMBULATORY_CARE_PROVIDER_SITE_OTHER): Payer: Medicare Other | Admitting: *Deleted

## 2013-09-22 DIAGNOSIS — Z7901 Long term (current) use of anticoagulants: Secondary | ICD-10-CM

## 2013-09-22 DIAGNOSIS — I4891 Unspecified atrial fibrillation: Secondary | ICD-10-CM

## 2013-09-22 LAB — POCT INR: INR: 4.2

## 2013-10-06 ENCOUNTER — Ambulatory Visit (INDEPENDENT_AMBULATORY_CARE_PROVIDER_SITE_OTHER): Payer: Medicare Other | Admitting: *Deleted

## 2013-10-06 DIAGNOSIS — I4891 Unspecified atrial fibrillation: Secondary | ICD-10-CM

## 2013-10-06 DIAGNOSIS — Z7901 Long term (current) use of anticoagulants: Secondary | ICD-10-CM

## 2013-10-06 LAB — POCT INR: INR: 3.3

## 2013-10-29 ENCOUNTER — Ambulatory Visit (INDEPENDENT_AMBULATORY_CARE_PROVIDER_SITE_OTHER): Payer: Medicare Other | Admitting: *Deleted

## 2013-10-29 DIAGNOSIS — I4891 Unspecified atrial fibrillation: Secondary | ICD-10-CM

## 2013-10-29 DIAGNOSIS — Z7901 Long term (current) use of anticoagulants: Secondary | ICD-10-CM

## 2013-10-29 LAB — POCT INR: INR: 3.4

## 2013-11-19 ENCOUNTER — Ambulatory Visit (INDEPENDENT_AMBULATORY_CARE_PROVIDER_SITE_OTHER): Payer: Medicare Other | Admitting: *Deleted

## 2013-11-19 DIAGNOSIS — Z7901 Long term (current) use of anticoagulants: Secondary | ICD-10-CM

## 2013-11-19 DIAGNOSIS — I4891 Unspecified atrial fibrillation: Secondary | ICD-10-CM

## 2013-11-19 LAB — POCT INR: INR: 4.8

## 2013-11-26 ENCOUNTER — Encounter (INDEPENDENT_AMBULATORY_CARE_PROVIDER_SITE_OTHER): Payer: Self-pay

## 2013-11-26 ENCOUNTER — Ambulatory Visit (INDEPENDENT_AMBULATORY_CARE_PROVIDER_SITE_OTHER): Payer: Medicare Other | Admitting: *Deleted

## 2013-11-26 DIAGNOSIS — I4891 Unspecified atrial fibrillation: Secondary | ICD-10-CM

## 2013-11-26 DIAGNOSIS — Z5181 Encounter for therapeutic drug level monitoring: Secondary | ICD-10-CM

## 2013-11-26 DIAGNOSIS — Z7901 Long term (current) use of anticoagulants: Secondary | ICD-10-CM

## 2013-11-26 LAB — POCT INR: INR: 3.7

## 2013-12-15 ENCOUNTER — Ambulatory Visit (INDEPENDENT_AMBULATORY_CARE_PROVIDER_SITE_OTHER): Payer: Medicare Other | Admitting: *Deleted

## 2013-12-15 DIAGNOSIS — Z5181 Encounter for therapeutic drug level monitoring: Secondary | ICD-10-CM

## 2013-12-15 DIAGNOSIS — Z7901 Long term (current) use of anticoagulants: Secondary | ICD-10-CM

## 2013-12-15 DIAGNOSIS — I4891 Unspecified atrial fibrillation: Secondary | ICD-10-CM

## 2013-12-15 LAB — POCT INR: INR: 3.8

## 2013-12-15 MED ORDER — WARFARIN SODIUM 2.5 MG PO TABS: ORAL_TABLET | ORAL | Status: DC

## 2013-12-31 ENCOUNTER — Ambulatory Visit (INDEPENDENT_AMBULATORY_CARE_PROVIDER_SITE_OTHER): Payer: Medicare Other | Admitting: *Deleted

## 2013-12-31 DIAGNOSIS — Z7901 Long term (current) use of anticoagulants: Secondary | ICD-10-CM

## 2013-12-31 DIAGNOSIS — Z5181 Encounter for therapeutic drug level monitoring: Secondary | ICD-10-CM

## 2013-12-31 DIAGNOSIS — I4891 Unspecified atrial fibrillation: Secondary | ICD-10-CM

## 2013-12-31 LAB — POCT INR: INR: 2.4

## 2014-01-26 ENCOUNTER — Ambulatory Visit (INDEPENDENT_AMBULATORY_CARE_PROVIDER_SITE_OTHER): Payer: Medicare Other | Admitting: *Deleted

## 2014-01-26 DIAGNOSIS — I4891 Unspecified atrial fibrillation: Secondary | ICD-10-CM

## 2014-01-26 DIAGNOSIS — Z7901 Long term (current) use of anticoagulants: Secondary | ICD-10-CM

## 2014-01-26 DIAGNOSIS — Z5181 Encounter for therapeutic drug level monitoring: Secondary | ICD-10-CM

## 2014-01-26 LAB — POCT INR: INR: 2.2

## 2014-02-12 ENCOUNTER — Encounter: Payer: Self-pay | Admitting: Cardiovascular Disease

## 2014-02-12 ENCOUNTER — Telehealth: Payer: Self-pay | Admitting: Cardiovascular Disease

## 2014-02-12 ENCOUNTER — Ambulatory Visit (INDEPENDENT_AMBULATORY_CARE_PROVIDER_SITE_OTHER): Payer: Medicare Other | Admitting: Cardiovascular Disease

## 2014-02-12 VITALS — BP 157/60 | HR 76 | Ht 69.0 in | Wt 203.0 lb

## 2014-02-12 DIAGNOSIS — Z7901 Long term (current) use of anticoagulants: Secondary | ICD-10-CM

## 2014-02-12 DIAGNOSIS — I25810 Atherosclerosis of coronary artery bypass graft(s) without angina pectoris: Secondary | ICD-10-CM

## 2014-02-12 DIAGNOSIS — I10 Essential (primary) hypertension: Secondary | ICD-10-CM

## 2014-02-12 DIAGNOSIS — I4891 Unspecified atrial fibrillation: Secondary | ICD-10-CM

## 2014-02-12 DIAGNOSIS — I739 Peripheral vascular disease, unspecified: Secondary | ICD-10-CM

## 2014-02-12 DIAGNOSIS — E785 Hyperlipidemia, unspecified: Secondary | ICD-10-CM

## 2014-02-12 MED ORDER — CLONIDINE HCL 0.3 MG/24HR TD PTWK: 0.3000 mg | MEDICATED_PATCH | TRANSDERMAL | Status: DC

## 2014-02-12 MED ORDER — AMLODIPINE BESYLATE 10 MG PO TABS: 10.0000 mg | ORAL_TABLET | Freq: Every day | ORAL | Status: DC

## 2014-02-12 NOTE — Telephone Encounter (Signed)
Please see refill bin / tgs

## 2014-02-12 NOTE — Patient Instructions (Signed)
Your physician wants you to follow-up in: 6 months You will receive a reminder letter in the mail two months in advance. If you don't receive a letter, please call our office to schedule the follow-up appointment.  Your physician has recommended you make the following change in your medication:     INCREASE Amlodipine to 10 mg daily    Thank you for choosing [facility] !

## 2014-02-12 NOTE — Progress Notes (Signed)
Patient ID: TERRYN REDNER, male   DOB: 1936-01-14, 78 y.o.   MRN: [id number]      SUBJECTIVE: Mr. Henson is a 78 yr old male with atrial fibrillation requiring anticoagulation, multiple cardiovascular risk factors including diabetes, hypertension and hyperlipidemia and known coronary and peripheral vascular disease, with a h/o CABG and MI in his 27's.  I had tried low-dose simvastatin at his last visit. He took it for a week and then developed blisters on his legs, diffuse myalgias, and a headache, and had to stop taking it. He has not been able to walk due to ulcer formation on the soles of his feet, for which he sees a podiatrist. He denies chest pain, shortness of breath, orthopnea, and paroxysmal nocturnal dyspnea.    Allergies  Allergen Reactions  . Amoxicillin   . Lipitor [Atorvastatin]     DIZZYNESS  . Sulfonamide Derivatives     Current Outpatient Prescriptions  Medication Sig Dispense Refill  . amLODipine (NORVASC) 5 MG tablet Take 1 tablet (5 mg total) by mouth daily.  90 tablet  3  . cephALEXin (KEFLEX) 500 MG capsule       . cloNIDine (CATAPRES - DOSED IN MG/24 HR) 0.3 mg/24hr Place 1 patch (0.3 mg total) onto the skin once a week.  4 patch  12  . fish oil-omega-3 fatty acids 1000 MG capsule Take 1 capsule by mouth daily.        Marland Kitchen losartan-hydrochlorothiazide (HYZAAR) 100-25 MG per tablet Take 1 tablet by mouth daily.        . metFORMIN (GLUCOPHAGE) 500 MG tablet Take 500 mg by mouth daily.        . Multiple Vitamins-Minerals (CENTRUM SILVER PO) Take 1 tablet by mouth daily.        . nebivolol (BYSTOLIC) 10 MG tablet TAKE ONE TABLET BY MOUTH EVERY DAY  30 tablet  3  . silver sulfADIAZINE (SILVADENE) 1 % cream       . simvastatin (ZOCOR) 20 MG tablet Take 1 tablet (20 mg total) by mouth at bedtime.  90 tablet  1  . spironolactone (ALDACTONE) 25 MG tablet Take 1 tablet (25 mg total) by mouth daily.  90 tablet  1  . warfarin (COUMADIN) 2.5 MG tablet Take 1 tablet daily or as  directed by coumadin clinic  90 tablet  3   No current facility-administered medications for this visit.    Past Medical History  Diagnosis Date  . Hyperlipidemia   . Hypertension   . Arteriosclerotic cardiovascular disease (ASCVD)     CABG in 1990s; 2002 total obstruction of LAD, CX and RCA with patent grafts and nl EF; Stress nuc. 2008 - mild LV dilation; normal EF; questionable small anteroapical scar; no ischemia  . Atrial fibrillation   . Squamous cell carcinoma     Left jaw, resected in 1990s  . Foot ulcer     Left with associated osteomyelitis; left leg cellulitis in 2008;  . Tibia fracture     Left  . Pedal edema   . Chronic anticoagulation     Past Surgical History  Procedure Laterality Date  . Coronary artery bypass graft  1990's  . Squamous cell carcinoma excision  1993    Left lower face  . Tonsillectomy    . Orif tibia fracture      left    History   Social History  . Marital Status: Married    Spouse Name: N/A    Number of Children: N/A  .  Years of Education: N/A   Occupational History  . Not on file.   Social History Main Topics  . Smoking status: Never Smoker   . Smokeless tobacco: Not on file  . Alcohol Use: No  . Drug Use: No  . Sexual Activity: Not on file   Other Topics Concern  . Not on file   Social History Narrative  . No narrative on file     Filed Vitals:   02/12/14 0825  Height: 5' 9"  (1.753 m)   BP 157/60  Pulse 76   PHYSICAL EXAM General: NAD  Neck: No JVD, no thyromegaly or thyroid nodule.  Lungs: Clear to auscultation bilaterally with normal respiratory effort.  CV: Nondisplaced PMI. Heart irregular, normal rate, normal S1/S2, no S3/S4, soft I/VI systolic murmur heard at RUSB and along left sternal border. Trace peripheral leg edema. No carotid bruit.   Abdomen: Soft, nontender, no hepatosplenomegaly, no distention.  Neurologic: Alert and oriented x 3.  Psych: Normal affect.  Extremities: No clubbing or cyanosis.  Erythematous bilaterally.   ECG: reviewed and available in electronic records.      ASSESSMENT AND PLAN: 1. CAD: Symptomatically stable on Bystolic and ASA. Unable to tolerate statins. 2. Atrial fibrillation: Rate controlled, on warfarin.  3. HTN: Presently uncontrolled on current therapy, which includes amlodipine, spironolactone, Bystolic, clonidine, and Hyzaar. Will increase amlodipine to 10 mg daily. 4. Hyperlipidemia: Unable to tolerate statins. Obtain lipid panel results from PCP's office. 5. PVD: Continue ASA, unable to tolerate statins.  Dispo: f/u 6 months.   Kate Sable, M.D., F.A.C.C.

## 2014-02-23 ENCOUNTER — Ambulatory Visit (INDEPENDENT_AMBULATORY_CARE_PROVIDER_SITE_OTHER): Payer: Medicare Other | Admitting: *Deleted

## 2014-02-23 DIAGNOSIS — Z7901 Long term (current) use of anticoagulants: Secondary | ICD-10-CM

## 2014-02-23 DIAGNOSIS — Z5181 Encounter for therapeutic drug level monitoring: Secondary | ICD-10-CM

## 2014-02-23 DIAGNOSIS — I4891 Unspecified atrial fibrillation: Secondary | ICD-10-CM

## 2014-02-23 LAB — POCT INR: INR: 2.4

## 2014-02-24 ENCOUNTER — Encounter: Payer: Self-pay | Admitting: Cardiovascular Disease

## 2014-03-09 ENCOUNTER — Telehealth: Payer: Self-pay | Admitting: Cardiovascular Disease

## 2014-03-09 MED ORDER — NEBIVOLOL HCL 10 MG PO TABS: ORAL_TABLET | ORAL | Status: DC

## 2014-03-09 NOTE — Telephone Encounter (Signed)
Medication sent via escribe.  

## 2014-03-09 NOTE — Telephone Encounter (Signed)
Received fax refill request  Rx # [id number] Medication:  Bystolic 10 mg tab Qty 30 Sig:  Take one tablet by mouth once daily Physician:  Bronson Ing

## 2014-03-10 ENCOUNTER — Telehealth: Payer: Self-pay | Admitting: Cardiovascular Disease

## 2014-03-10 MED ORDER — NEBIVOLOL HCL 10 MG PO TABS: ORAL_TABLET | ORAL | Status: DC

## 2014-03-10 NOTE — Telephone Encounter (Signed)
Please see paper in refill bin / tgs

## 2014-03-10 NOTE — Telephone Encounter (Signed)
Please see refill bin / tgs

## 2014-03-10 NOTE — Telephone Encounter (Signed)
Refill request complete

## 2014-03-10 NOTE — Telephone Encounter (Signed)
Medication sent via escribe.  

## 2014-03-17 ENCOUNTER — Telehealth: Payer: Self-pay | Admitting: *Deleted

## 2014-03-17 ENCOUNTER — Telehealth: Payer: Self-pay | Admitting: Cardiovascular Disease

## 2014-03-17 MED ORDER — NEBIVOLOL HCL 10 MG PO TABS: ORAL_TABLET | ORAL | Status: DC

## 2014-03-17 NOTE — Telephone Encounter (Signed)
BYSTOLIC 10 MG

## 2014-03-17 NOTE — Telephone Encounter (Signed)
Please send in 1 wk supply for Bystolic to [facility] in [place] until he can receive his [facility] / tgs

## 2014-03-17 NOTE — Telephone Encounter (Signed)
Medication sent via escribe.  

## 2014-03-18 ENCOUNTER — Other Ambulatory Visit: Payer: Self-pay

## 2014-03-18 MED ORDER — NEBIVOLOL HCL 10 MG PO TABS: ORAL_TABLET | ORAL | Status: DC

## 2014-03-19 ENCOUNTER — Other Ambulatory Visit (HOSPITAL_COMMUNITY): Payer: Self-pay | Admitting: Family Medicine

## 2014-03-19 ENCOUNTER — Ambulatory Visit (HOSPITAL_COMMUNITY)
Admission: RE | Admit: 2014-03-19 | Discharge: 2014-03-19 | Disposition: A | Discharge status: Home / Self Care | Payer: Medicare Other | Source: Ambulatory Visit | Attending: Family Medicine | Admitting: Family Medicine

## 2014-03-19 DIAGNOSIS — G542 Cervical root disorders, not elsewhere classified: Secondary | ICD-10-CM

## 2014-03-19 DIAGNOSIS — M47812 Spondylosis without myelopathy or radiculopathy, cervical region: Secondary | ICD-10-CM | POA: Insufficient documentation

## 2014-03-30 ENCOUNTER — Ambulatory Visit (INDEPENDENT_AMBULATORY_CARE_PROVIDER_SITE_OTHER): Payer: Medicare Other | Admitting: *Deleted

## 2014-03-30 DIAGNOSIS — I4891 Unspecified atrial fibrillation: Secondary | ICD-10-CM

## 2014-03-30 DIAGNOSIS — Z7901 Long term (current) use of anticoagulants: Secondary | ICD-10-CM

## 2014-03-30 DIAGNOSIS — Z5181 Encounter for therapeutic drug level monitoring: Secondary | ICD-10-CM

## 2014-03-30 LAB — POCT INR: INR: 5.6

## 2014-04-06 ENCOUNTER — Ambulatory Visit (INDEPENDENT_AMBULATORY_CARE_PROVIDER_SITE_OTHER): Payer: Medicare Other | Admitting: *Deleted

## 2014-04-06 DIAGNOSIS — Z7901 Long term (current) use of anticoagulants: Secondary | ICD-10-CM

## 2014-04-06 DIAGNOSIS — Z5181 Encounter for therapeutic drug level monitoring: Secondary | ICD-10-CM

## 2014-04-06 DIAGNOSIS — I4891 Unspecified atrial fibrillation: Secondary | ICD-10-CM

## 2014-04-06 LAB — POCT INR: INR: 3.3

## 2014-04-20 ENCOUNTER — Ambulatory Visit (INDEPENDENT_AMBULATORY_CARE_PROVIDER_SITE_OTHER): Payer: Medicare Other | Admitting: *Deleted

## 2014-04-20 DIAGNOSIS — Z7901 Long term (current) use of anticoagulants: Secondary | ICD-10-CM

## 2014-04-20 DIAGNOSIS — I4891 Unspecified atrial fibrillation: Secondary | ICD-10-CM

## 2014-04-20 DIAGNOSIS — Z5181 Encounter for therapeutic drug level monitoring: Secondary | ICD-10-CM

## 2014-04-20 DIAGNOSIS — I482 Chronic atrial fibrillation, unspecified: Secondary | ICD-10-CM

## 2014-04-20 LAB — POCT INR: INR: 2.9

## 2014-05-01 ENCOUNTER — Ambulatory Visit (INDEPENDENT_AMBULATORY_CARE_PROVIDER_SITE_OTHER): Payer: Medicare Other

## 2014-05-01 VITALS — BP 144/60 | HR 58

## 2014-05-01 DIAGNOSIS — I10 Essential (primary) hypertension: Secondary | ICD-10-CM

## 2014-05-01 NOTE — Patient Instructions (Signed)
Continue your blood pressure medication as planned   I will give a record of your BP recordings to [place]     Thank you for choosing [facility] !

## 2014-05-01 NOTE — Progress Notes (Signed)
Pt worried about BP 98/56 and held his BP meds today.He had no sx's of dizziness etc,was just concerned over reading.BP now 144/66 encouraged pt to take his BP medication.He has given me two pages of BP recordings I will give to Dr.Koneswaran to review

## 2014-05-04 ENCOUNTER — Encounter: Payer: Self-pay | Admitting: Cardiovascular Disease

## 2014-05-11 ENCOUNTER — Ambulatory Visit (INDEPENDENT_AMBULATORY_CARE_PROVIDER_SITE_OTHER): Payer: Medicare Other | Admitting: *Deleted

## 2014-05-11 DIAGNOSIS — Z5181 Encounter for therapeutic drug level monitoring: Secondary | ICD-10-CM

## 2014-05-11 DIAGNOSIS — Z7901 Long term (current) use of anticoagulants: Secondary | ICD-10-CM

## 2014-05-11 DIAGNOSIS — I4891 Unspecified atrial fibrillation: Secondary | ICD-10-CM

## 2014-05-11 LAB — POCT INR: INR: 5.6

## 2014-05-13 ENCOUNTER — Ambulatory Visit (INDEPENDENT_AMBULATORY_CARE_PROVIDER_SITE_OTHER): Payer: Medicare Other | Admitting: *Deleted

## 2014-05-13 DIAGNOSIS — Z7901 Long term (current) use of anticoagulants: Secondary | ICD-10-CM

## 2014-05-13 DIAGNOSIS — Z5181 Encounter for therapeutic drug level monitoring: Secondary | ICD-10-CM

## 2014-05-13 DIAGNOSIS — I4891 Unspecified atrial fibrillation: Secondary | ICD-10-CM

## 2014-05-13 LAB — POCT INR: INR: 2.9

## 2014-05-28 ENCOUNTER — Ambulatory Visit (INDEPENDENT_AMBULATORY_CARE_PROVIDER_SITE_OTHER): Payer: Medicare Other | Admitting: *Deleted

## 2014-05-28 DIAGNOSIS — I4891 Unspecified atrial fibrillation: Secondary | ICD-10-CM

## 2014-05-28 DIAGNOSIS — Z5181 Encounter for therapeutic drug level monitoring: Secondary | ICD-10-CM

## 2014-05-28 DIAGNOSIS — Z7901 Long term (current) use of anticoagulants: Secondary | ICD-10-CM

## 2014-05-28 LAB — POCT INR: INR: 1.8

## 2014-06-08 ENCOUNTER — Ambulatory Visit (INDEPENDENT_AMBULATORY_CARE_PROVIDER_SITE_OTHER): Payer: Medicare Other | Admitting: *Deleted

## 2014-06-08 DIAGNOSIS — Z7901 Long term (current) use of anticoagulants: Secondary | ICD-10-CM

## 2014-06-08 DIAGNOSIS — Z5181 Encounter for therapeutic drug level monitoring: Secondary | ICD-10-CM

## 2014-06-08 DIAGNOSIS — I4891 Unspecified atrial fibrillation: Secondary | ICD-10-CM

## 2014-06-08 LAB — POCT INR: INR: 1.9

## 2014-07-02 ENCOUNTER — Ambulatory Visit (INDEPENDENT_AMBULATORY_CARE_PROVIDER_SITE_OTHER): Payer: Medicare Other | Admitting: *Deleted

## 2014-07-02 DIAGNOSIS — Z5181 Encounter for therapeutic drug level monitoring: Secondary | ICD-10-CM

## 2014-07-02 DIAGNOSIS — Z7901 Long term (current) use of anticoagulants: Secondary | ICD-10-CM

## 2014-07-02 DIAGNOSIS — I4891 Unspecified atrial fibrillation: Secondary | ICD-10-CM

## 2014-07-02 LAB — POCT INR: INR: 2.2

## 2014-08-05 ENCOUNTER — Encounter: Payer: Self-pay | Admitting: Cardiovascular Disease

## 2014-08-05 ENCOUNTER — Ambulatory Visit (INDEPENDENT_AMBULATORY_CARE_PROVIDER_SITE_OTHER): Payer: Medicare Other | Admitting: Cardiovascular Disease

## 2014-08-05 ENCOUNTER — Ambulatory Visit (INDEPENDENT_AMBULATORY_CARE_PROVIDER_SITE_OTHER): Payer: Medicare Other | Admitting: *Deleted

## 2014-08-05 VITALS — BP 106/70 | HR 56 | Ht 69.0 in

## 2014-08-05 DIAGNOSIS — I739 Peripheral vascular disease, unspecified: Secondary | ICD-10-CM

## 2014-08-05 DIAGNOSIS — Z5181 Encounter for therapeutic drug level monitoring: Secondary | ICD-10-CM

## 2014-08-05 DIAGNOSIS — E782 Mixed hyperlipidemia: Secondary | ICD-10-CM

## 2014-08-05 DIAGNOSIS — I4891 Unspecified atrial fibrillation: Secondary | ICD-10-CM

## 2014-08-05 DIAGNOSIS — R634 Abnormal weight loss: Secondary | ICD-10-CM

## 2014-08-05 DIAGNOSIS — Z7901 Long term (current) use of anticoagulants: Secondary | ICD-10-CM

## 2014-08-05 DIAGNOSIS — I25810 Atherosclerosis of coronary artery bypass graft(s) without angina pectoris: Secondary | ICD-10-CM

## 2014-08-05 DIAGNOSIS — I10 Essential (primary) hypertension: Secondary | ICD-10-CM

## 2014-08-05 DIAGNOSIS — I482 Chronic atrial fibrillation, unspecified: Secondary | ICD-10-CM

## 2014-08-05 DIAGNOSIS — R197 Diarrhea, unspecified: Secondary | ICD-10-CM

## 2014-08-05 LAB — POCT INR: INR: 2.6

## 2014-08-05 NOTE — Progress Notes (Signed)
Patient ID: [name], male   DOB: 11-28-1935, 78 y.o.   MRN: [id number]      SUBJECTIVE: [name] is a 78 yr old male with atrial fibrillation and on anticoagulation, multiple cardiovascular risk factors including diabetes, hypertension and hyperlipidemia and known coronary artery and peripheral vascular disease, with a history of CABG and MI in his 17's. He denies chest pain, dizziness and syncope. He became short of breath when pushing a 300 pound woman in a wheelchair up a ramp. He does not get short of breath with routine daily activities. He had multiple episodes of diarrhea and lost a considerable amount of weight. His PCP iss checking Hemoccults and working up his anemia. He has not had a colonoscopy in the past year. He has limited feeling in his legs from the knees down.  ECG performed in the office today demonstrates atrial fibrillation, heart rate 52 beats per minute, with a nonspecific ST segment and T wave abnormality, QRS duration 116 ms.   Review of Systems: As per "subjective", otherwise negative.  Allergies  Allergen Reactions  . Simvastatin Other (See Comments)    Myalgias also  . Amoxicillin   . Lipitor [Atorvastatin]     DIZZYNESS  . Sulfonamide Derivatives     Current Outpatient Prescriptions  Medication Sig Dispense Refill  . amLODipine (NORVASC) 10 MG tablet Take 1 tablet (10 mg total) by mouth daily.  180 tablet  3  . aspirin 81 MG tablet Take 81 mg by mouth daily.      . cloNIDine (CATAPRES - DOSED IN MG/24 HR) 0.3 mg/24hr patch Place 1 patch (0.3 mg total) onto the skin once a week.  4 patch  12  . fish oil-omega-3 fatty acids 1000 MG capsule Take 1 capsule by mouth daily.        Marland Kitchen losartan-hydrochlorothiazide (HYZAAR) 100-25 MG per tablet Take 1 tablet by mouth daily.        . metFORMIN (GLUCOPHAGE) 500 MG tablet Take 500 mg by mouth daily.        . Multiple Vitamins-Minerals (CENTRUM SILVER PO) Take 1 tablet by mouth daily.        . nebivolol  (BYSTOLIC) 10 MG tablet TAKE ONE TABLET BY MOUTH EVERY DAY  14 tablet  0  . silver sulfADIAZINE (SILVADENE) 1 % cream       . spironolactone (ALDACTONE) 25 MG tablet Take 1 tablet (25 mg total) by mouth daily.  90 tablet  1  . warfarin (COUMADIN) 2.5 MG tablet Take 1 tablet daily or as directed by coumadin clinic  90 tablet  3   No current facility-administered medications for this visit.    Past Medical History  Diagnosis Date  . Hyperlipidemia   . Hypertension   . Arteriosclerotic cardiovascular disease (ASCVD)     CABG in 1990s; 2002 total obstruction of LAD, CX and RCA with patent grafts and nl EF; Stress nuc. 2008 - mild LV dilation; normal EF; questionable small anteroapical scar; no ischemia  . Atrial fibrillation   . Squamous cell carcinoma     Left jaw, resected in 1990s  . Foot ulcer     Left with associated osteomyelitis; left leg cellulitis in 2008;  . Tibia fracture     Left  . Pedal edema   . Chronic anticoagulation     Past Surgical History  Procedure Laterality Date  . Coronary artery bypass graft  1990's  . Squamous cell carcinoma excision  1993    Left  lower face  . Tonsillectomy    . Orif tibia fracture      left    History   Social History  . Marital Status: Married    Spouse Name: N/A    Number of Children: N/A  . Years of Education: N/A   Occupational History  . Not on file.   Social History Main Topics  . Smoking status: Never Smoker   . Smokeless tobacco: Not on file  . Alcohol Use: No  . Drug Use: No  . Sexual Activity: Not on file   Other Topics Concern  . Not on file   Social History Narrative  . No narrative on file    BP 106/70 Pulse 56    PHYSICAL EXAM General: NAD HEENT: Normal. Neck: No JVD, no thyromegaly. Lungs: Clear to auscultation bilaterally with normal respiratory effort. CV: Nondisplaced PMI.  Irregular rhythm, normal S1/S2, no S3, no murmur. Minimal pretibial edema.  No carotid bruit.  Diminished pedal  pulses.  Abdomen: Soft, nontender, no distention.  Neurologic: Alert and oriented.  Psych: Normal affect. Skin: Chronic venous stasis dermatitis b/l LE's. Musculoskeletal: Normal range of motion, no gross deformities. Extremities: No clubbing or cyanosis.   ECG: Most recent ECG reviewed.      ASSESSMENT AND PLAN: 1. CAD: Stable ischemic heart disease. Taking Bystolic and ASA. Unable to tolerate statins.  2. Atrial fibrillation: Rate controlled, on warfarin. INR 2.6 today. 3. Essential HTN: Controlled on current therapy which includes amlodipine, spironolactone, Bystolic, clonidine, and Hyzaar.  4. Hyperlipidemia: Unable to tolerate statins. Obtain lipid panel results from PCP's office. Possibly a candidate for PCSK-9 inhibitor. 5. PVD: Continue ASA, unable to tolerate statins. Will make a referral to vascular surgery and will obtain their opinion regarding further imaging (CTA vs MRA). 6. Chronic diarrhea and weight loss: May need a GI referral for colonoscopy.  Dispo: f/u 6 months.  Kate Sable, M.D., F.A.C.C.

## 2014-08-05 NOTE — Patient Instructions (Signed)
Your physician wants you to follow-up in: 6 months with Dr. Virgina Jock will receive a reminder letter in the mail two months in advance. If you don't receive a letter, please call our office to schedule the follow-up appointment.  You have been referred to Vascular. You will receive a call in the next week.  Your physician recommends that you continue on your current medications as directed. Please refer to the Current Medication list given to you today.  We will request your lab results from Dr. Karie Kirks  Thank you for choosing [facility]!!

## 2014-08-26 ENCOUNTER — Other Ambulatory Visit: Payer: Self-pay

## 2014-08-26 DIAGNOSIS — I739 Peripheral vascular disease, unspecified: Secondary | ICD-10-CM

## 2014-09-02 ENCOUNTER — Ambulatory Visit (INDEPENDENT_AMBULATORY_CARE_PROVIDER_SITE_OTHER): Payer: Medicare Other | Admitting: *Deleted

## 2014-09-02 DIAGNOSIS — Z7901 Long term (current) use of anticoagulants: Secondary | ICD-10-CM

## 2014-09-02 DIAGNOSIS — Z5181 Encounter for therapeutic drug level monitoring: Secondary | ICD-10-CM

## 2014-09-02 DIAGNOSIS — I4891 Unspecified atrial fibrillation: Secondary | ICD-10-CM

## 2014-09-02 LAB — POCT INR: INR: 2

## 2014-09-29 ENCOUNTER — Encounter: Payer: Self-pay | Admitting: Vascular Surgery

## 2014-09-30 ENCOUNTER — Ambulatory Visit (HOSPITAL_COMMUNITY)
Admission: RE | Admit: 2014-09-30 | Discharge: 2014-09-30 | Disposition: A | Discharge status: Home / Self Care | Payer: Medicare Other | Source: Ambulatory Visit | Attending: Vascular Surgery | Admitting: Vascular Surgery

## 2014-09-30 ENCOUNTER — Ambulatory Visit (INDEPENDENT_AMBULATORY_CARE_PROVIDER_SITE_OTHER): Payer: Medicare Other | Admitting: Vascular Surgery

## 2014-09-30 ENCOUNTER — Encounter: Payer: Self-pay | Admitting: Vascular Surgery

## 2014-09-30 ENCOUNTER — Ambulatory Visit (INDEPENDENT_AMBULATORY_CARE_PROVIDER_SITE_OTHER)
Admission: RE | Admit: 2014-09-30 | Discharge: 2014-09-30 | Disposition: A | Discharge status: Home / Self Care | Payer: Medicare Other | Source: Ambulatory Visit | Attending: Vascular Surgery | Admitting: Vascular Surgery

## 2014-09-30 VITALS — BP 150/70 | HR 62 | Ht 69.0 in | Wt 180.0 lb

## 2014-09-30 DIAGNOSIS — I739 Peripheral vascular disease, unspecified: Secondary | ICD-10-CM

## 2014-09-30 DIAGNOSIS — I25810 Atherosclerosis of coronary artery bypass graft(s) without angina pectoris: Secondary | ICD-10-CM

## 2014-09-30 NOTE — Progress Notes (Signed)
Patient ID: [name], male   DOB: [birth year], 78 y.o.   MRN: [id number]  Reason for Consult: Peripheral vascular disease with claudication    Referred by Robert Bellow, MD  Subjective:     HPI:  [name] is a 78 y.o. male who was referred for evaluation of peripheral vascular disease. He states that both lower extremities "give out on him in "when he is ambulating. This does not typically occur when he is sitting or standing. I do not get any clear-cut history of calf claudication. There is no history of rest pain or nonhealing ulcers. He does describe some paresthesias in his feet consistent with neuropathy.  He did have a significant injury to his left leg many years ago playing softball. He denies any history of DVT or phlebitis.  The patient is not a smoker. His risk factors for vascular disease do include diabetes, hypertension, and hypercholesterolemia.  I have reviewed the records from Dr. Court Joy office. This patient has a history of atrial fibrillation and is on Coumadin. The patient has a history of previous MI and has undergone previous CABG when he was younger. The patient is unable to tolerate statins. His INR was therapeutic at his last visit in October 2015. The patient has essential hypertension which is well-controlled. The patient also has some issues with chronic diarrhea.   Past Medical History  Diagnosis Date  . Hyperlipidemia   . Hypertension   . Arteriosclerotic cardiovascular disease (ASCVD)     CABG in 1990s; 2002 total obstruction of LAD, CX and RCA with patent grafts and nl EF; Stress nuc. 2008 - mild LV dilation; normal EF; questionable small anteroapical scar; no ischemia  . Atrial fibrillation   . Squamous cell carcinoma     Left jaw, resected in 1990s  . Foot ulcer     Left with associated osteomyelitis; left leg cellulitis in 2008;  . Tibia fracture     Left  . Pedal edema   . Chronic anticoagulation   . Diabetes mellitus  without complication   . Myocardial infarction   . Peripheral vascular disease   . CAD (coronary artery disease)    Family History  Problem Relation Age of Onset  . Heart disease Other   . Heart disease Mother    Past Surgical History  Procedure Laterality Date  . Coronary artery bypass graft  1990's  . Squamous cell carcinoma excision  1993    Left lower face  . Tonsillectomy    . Orif tibia fracture      left    Short Social History:  History  Substance Use Topics  . Smoking status: Never Smoker   . Smokeless tobacco: Never Used  . Alcohol Use: No    Allergies  Allergen Reactions  . Simvastatin Other (See Comments)    Myalgias also  . Amoxicillin   . Lipitor [Atorvastatin]     DIZZYNESS  . Sulfonamide Derivatives     Current Outpatient Prescriptions  Medication Sig Dispense Refill  . amLODipine (NORVASC) 10 MG tablet Take 1 tablet (10 mg total) by mouth daily. 180 tablet 3  . aspirin 81 MG tablet Take 81 mg by mouth daily.    . cloNIDine (CATAPRES - DOSED IN MG/24 HR) 0.3 mg/24hr patch Place 1 patch (0.3 mg total) onto the skin once a week. 4 patch 12  . fish oil-omega-3 fatty acids 1000 MG capsule Take 1 capsule by mouth daily.      Marland Kitchen  losartan-hydrochlorothiazide (HYZAAR) 100-25 MG per tablet Take 1 tablet by mouth daily.      . Multiple Vitamins-Minerals (CENTRUM SILVER PO) Take 1 tablet by mouth daily.      . nebivolol (BYSTOLIC) 10 MG tablet TAKE ONE TABLET BY MOUTH EVERY DAY 14 tablet 0  . silver sulfADIAZINE (SILVADENE) 1 % cream     . spironolactone (ALDACTONE) 25 MG tablet Take 1 tablet (25 mg total) by mouth daily. 90 tablet 1  . warfarin (COUMADIN) 2.5 MG tablet Take 1 tablet daily or as directed by coumadin clinic 90 tablet 3   No current facility-administered medications for this visit.    Review of Systems  Constitutional: Negative for chills and fever.  Eyes: Negative for loss of vision.  Respiratory: Negative for cough and wheezing.    Cardiovascular: Positive for dyspnea with exertion. Negative for chest pain, chest tightness, claudication, orthopnea and palpitations.  GI: Positive for blood in stool. Negative for vomiting.  GU: Negative for dysuria and hematuria.  Musculoskeletal: Negative for leg pain, joint pain and myalgias.  Skin: Negative for rash and wound.  Neurological: Positive for numbness. Negative for dizziness and speech difficulty.  Hematologic: Negative for bruises/bleeds easily. Psychiatric: Negative for depressed mood.        Objective:  Objective  Filed Vitals:   09/30/14 1355  BP: 150/70  Pulse: 62  Height: 5' 9"  (1.753 m)  Weight: 180 lb (81.647 kg)  SpO2: 99%   Body mass index is 26.57 kg/(m^2).  Physical Exam  Constitutional: He is oriented to person, place, and time. He appears well-developed and well-nourished.  HENT:  Head: Normocephalic and atraumatic.  Neck: Neck supple. No JVD present. No thyromegaly present.  Cardiovascular: Normal rate, regular rhythm and normal heart sounds.  Exam reveals no friction rub.   No murmur heard. Pulses:      Femoral pulses are 2+ on the right side, and 2+ on the left side.      Popliteal pulses are 2+ on the right side, and 0 on the left side.       Dorsalis pedis pulses are 1+ on the right side, and 0 on the left side.       Posterior tibial pulses are 1+ on the right side, and 0 on the left side.  I do not detect carotid bruits.  Pulmonary/Chest: Breath sounds normal. He has no wheezes. He has no rales.  Abdominal: Soft. Bowel sounds are normal. There is no tenderness.  Musculoskeletal: Normal range of motion. He exhibits no edema.  Lymphadenopathy:    He has no cervical adenopathy.  Neurological: He is alert and oriented to person, place, and time. He has normal strength. No sensory deficit.  Skin: No lesion and no rash noted.  He has hyperpigmentation bilaterally consistent with chronic venous insufficiency.  Psychiatric: He has a  normal mood and affect.   Data: I have independently interpreted his arterial Doppler study. On the right side he has a triphasic posterior tibial signal with an ABI of greater than 100%. There is a monophasic dorsalis pedis signal. Digital pressure on the right is 123 mmHg.  On the left side, there is a monophasic posterior tibial signal and dorsalis pedis signal. ABI is 92%. Toe pressure on the left is 74 mmHg.  I have independently interpreted the arterial duplex of both lower extremities. tthe right common femoral artery waveform is triphasic. The left common femoral artery waveform is biphasic.  Based on this exam the patient has evidence  of tibial artery occlusive disease bilaterally.      Assessment/Plan:     PVD (peripheral vascular disease) with claudication Based on his exam, he has evidence of infrainguinal arterial occlusive disease on the left side. On the right side however, he has palpable pedal pulses and a normal ABI. Given that his symptoms involving both lower extremities I'm not convinced that his symptoms are related to his peripheral vascular disease. He denies any back pain but his symptoms could be neurogenic in origin. He does have significant neuropathy. Given his infrainguinal arterial occlusive disease on the left, I would only consider arteriography and revascularization if he developed a nonhealing wound or significant rest pain. Fortunately, he is not a smoker. I have encouraged him to stay as active as possible. I'll plan on seeing him back in one year with follow up ABIs. He knows to call sooner if he has problems.    Angelia Mould MD [facility]

## 2014-09-30 NOTE — Assessment & Plan Note (Signed)
Based on his exam, he has evidence of infrainguinal arterial occlusive disease on the left side. On the right side however, he has palpable pedal pulses and a normal ABI. Given that his symptoms involving both lower extremities I'm not convinced that his symptoms are related to his peripheral vascular disease. He denies any back pain but his symptoms could be neurogenic in origin. He does have significant neuropathy. Given his infrainguinal arterial occlusive disease on the left, I would only consider arteriography and revascularization if he developed a nonhealing wound or significant rest pain. Fortunately, he is not a smoker. I have encouraged him to stay as active as possible. I'll plan on seeing him back in one year with follow up ABIs. He knows to call sooner if he has problems.

## 2014-10-01 NOTE — Addendum Note (Signed)
Addended by: Mena Goes on: 10/01/2014 11:29 AM   Modules accepted: Orders

## 2014-10-07 ENCOUNTER — Encounter (INDEPENDENT_AMBULATORY_CARE_PROVIDER_SITE_OTHER): Payer: Self-pay | Admitting: *Deleted

## 2014-10-07 ENCOUNTER — Ambulatory Visit (INDEPENDENT_AMBULATORY_CARE_PROVIDER_SITE_OTHER): Payer: Medicare Other | Admitting: *Deleted

## 2014-10-07 DIAGNOSIS — I4891 Unspecified atrial fibrillation: Secondary | ICD-10-CM

## 2014-10-07 DIAGNOSIS — Z5181 Encounter for therapeutic drug level monitoring: Secondary | ICD-10-CM

## 2014-10-07 DIAGNOSIS — Z7901 Long term (current) use of anticoagulants: Secondary | ICD-10-CM

## 2014-10-07 LAB — POCT INR: INR: 1.9

## 2014-11-04 ENCOUNTER — Ambulatory Visit (INDEPENDENT_AMBULATORY_CARE_PROVIDER_SITE_OTHER): Payer: Medicare Other | Admitting: *Deleted

## 2014-11-04 DIAGNOSIS — Z7901 Long term (current) use of anticoagulants: Secondary | ICD-10-CM

## 2014-11-04 DIAGNOSIS — I480 Paroxysmal atrial fibrillation: Secondary | ICD-10-CM

## 2014-11-04 DIAGNOSIS — I4891 Unspecified atrial fibrillation: Secondary | ICD-10-CM

## 2014-11-04 DIAGNOSIS — Z5181 Encounter for therapeutic drug level monitoring: Secondary | ICD-10-CM

## 2014-11-04 LAB — POCT INR: INR: 1.9

## 2014-11-10 ENCOUNTER — Ambulatory Visit (INDEPENDENT_AMBULATORY_CARE_PROVIDER_SITE_OTHER): Payer: Medicare Other | Admitting: Internal Medicine

## 2014-11-18 ENCOUNTER — Encounter (INDEPENDENT_AMBULATORY_CARE_PROVIDER_SITE_OTHER): Payer: Self-pay | Admitting: Internal Medicine

## 2014-11-18 ENCOUNTER — Other Ambulatory Visit (INDEPENDENT_AMBULATORY_CARE_PROVIDER_SITE_OTHER): Payer: Self-pay | Admitting: *Deleted

## 2014-11-18 ENCOUNTER — Telehealth (INDEPENDENT_AMBULATORY_CARE_PROVIDER_SITE_OTHER): Payer: Self-pay | Admitting: *Deleted

## 2014-11-18 ENCOUNTER — Ambulatory Visit (INDEPENDENT_AMBULATORY_CARE_PROVIDER_SITE_OTHER): Payer: Medicare Other | Admitting: Internal Medicine

## 2014-11-18 VITALS — BP 134/56 | HR 64 | Temp 97.8°F | Ht 69.0 in | Wt 182.5 lb

## 2014-11-18 DIAGNOSIS — Z1211 Encounter for screening for malignant neoplasm of colon: Secondary | ICD-10-CM

## 2014-11-18 DIAGNOSIS — R634 Abnormal weight loss: Secondary | ICD-10-CM

## 2014-11-18 DIAGNOSIS — R197 Diarrhea, unspecified: Secondary | ICD-10-CM

## 2014-11-18 NOTE — Telephone Encounter (Signed)
Patient is scheduled for colonoscopy 11/27/14 and needs to stop Coumadin 5 days and ASA 2 days, is this ok, please advise

## 2014-11-18 NOTE — Patient Instructions (Signed)
Colonoscopy.  The risks and benefits such as perforation, bleeding, and infection were reviewed with the patient and is agreeable. 

## 2014-11-18 NOTE — Progress Notes (Addendum)
   Subjective:    Patient ID: [name], male    DOB: 22-Oct-1936, 79 y.o.   MRN: [id number]  HPI Referred to our office by Dr. Karie Kirks for colonoscopy/ poss\ible EGD. Last year in June he had diarrhea for 6 months. He lost from 220 to 170. He had not been on any recent antibiotics. Started taking a Probiotic x 2 weeks. He says now his stools are better. Stools now are normal. He says once in a while he will having diarrhea. His appetite is better. He is watching what he eats because he does not want to gain all the weight back.  He is having 1-2 stools a day normal. Stools are normal. Stools are darker, which he thinks is from the iron. Appetite is good. Maintaining his weight. There is not acid reflux. There is no abdominal pain. Denies seeing blood in his stool. Chronic Coumadin therapy atrial fib, CABG, MI Hx of cancer in his jaw and had jaw surgery. No family hx of colon cancer.  His last colonoscopy 2008 Dr. Arnoldo Morale and was normal.  Neuropathy to feet. Ulcer to both feet. Denies hx of diabetes. HA1C 03/20/2014  6.3    07/22/2014 H and H 11.7 and 36.2, MCV 91.5, Platelet ct 293,  08/05/2014 Iron 32, IBC  243, % Saturation 13, Ferritin 89, vitamin B12 428, Folate 14.8 08/04/2014 Fecal occult blood negative  Review of Systems Married. Two children in good health. Retired from [employer]     Objective:   Physical Exam  Filed Vitals:   11/18/14 1459  Height: 5' 9"  (1.753 m)  Weight: 182 lb 8 oz (82.781 kg)   Alert and oriented. Skin warm and dry. Oral mucosa is moist.   . Sclera anicteric, conjunctivae is pink. Thyroid not enlarged. No cervical lymphadenopathy. Lungs clear. Heart regular rate and rhythm.  Abdomen is soft. Bowel sounds are positive. No hepatomegaly. No abdominal masses felt. No tenderness.  No edema to lower extremities.          Assessment & Plan:  Chronic diarrhea resolved.  Needs colonoscopy to rule out colonic colonoscopy.

## 2014-11-18 NOTE — Telephone Encounter (Signed)
Patient needs movi prep 

## 2014-11-18 NOTE — Telephone Encounter (Signed)
Dr Raliegh Ip Is this OK with you?

## 2014-11-19 MED ORDER — PEG-KCL-NACL-NASULF-NA ASC-C 100 G PO SOLR: 1.0000 | Freq: Once | ORAL | Status: DC

## 2014-11-19 NOTE — Telephone Encounter (Signed)
Ann, Please see Dr Marthann Schiller note below. Thanks Lattie Haw

## 2014-11-19 NOTE — Telephone Encounter (Signed)
Ideally, he can stop warfarin but if possible would like for him to continue ASA 81 mg.

## 2014-11-19 NOTE — Telephone Encounter (Signed)
Patient aware.

## 2014-11-20 ENCOUNTER — Encounter (INDEPENDENT_AMBULATORY_CARE_PROVIDER_SITE_OTHER): Payer: Self-pay

## 2014-11-26 ENCOUNTER — Telehealth (INDEPENDENT_AMBULATORY_CARE_PROVIDER_SITE_OTHER): Payer: Self-pay | Admitting: Internal Medicine

## 2014-11-27 ENCOUNTER — Encounter (INDEPENDENT_AMBULATORY_CARE_PROVIDER_SITE_OTHER): Payer: Self-pay

## 2014-11-27 ENCOUNTER — Ambulatory Visit (HOSPITAL_COMMUNITY)
Admission: RE | Admit: 2014-11-27 | Discharge: 2014-11-27 | Disposition: A | Discharge status: Home / Self Care | Payer: Medicare Other | Source: Ambulatory Visit | Attending: Internal Medicine | Admitting: Internal Medicine

## 2014-11-27 ENCOUNTER — Encounter (HOSPITAL_COMMUNITY): Payer: Self-pay | Admitting: *Deleted

## 2014-11-27 ENCOUNTER — Encounter (HOSPITAL_COMMUNITY)
Admission: RE | Disposition: A | Discharge status: Home / Self Care | Payer: Self-pay | Source: Ambulatory Visit | Attending: Internal Medicine

## 2014-11-27 DIAGNOSIS — D123 Benign neoplasm of transverse colon: Secondary | ICD-10-CM | POA: Insufficient documentation

## 2014-11-27 DIAGNOSIS — I10 Essential (primary) hypertension: Secondary | ICD-10-CM | POA: Insufficient documentation

## 2014-11-27 DIAGNOSIS — I739 Peripheral vascular disease, unspecified: Secondary | ICD-10-CM | POA: Diagnosis not present

## 2014-11-27 DIAGNOSIS — Z951 Presence of aortocoronary bypass graft: Secondary | ICD-10-CM | POA: Diagnosis not present

## 2014-11-27 DIAGNOSIS — K648 Other hemorrhoids: Secondary | ICD-10-CM | POA: Insufficient documentation

## 2014-11-27 DIAGNOSIS — K644 Residual hemorrhoidal skin tags: Secondary | ICD-10-CM | POA: Diagnosis not present

## 2014-11-27 DIAGNOSIS — I252 Old myocardial infarction: Secondary | ICD-10-CM | POA: Diagnosis not present

## 2014-11-27 DIAGNOSIS — Z882 Allergy status to sulfonamides status: Secondary | ICD-10-CM | POA: Diagnosis not present

## 2014-11-27 DIAGNOSIS — Z881 Allergy status to other antibiotic agents status: Secondary | ICD-10-CM | POA: Diagnosis not present

## 2014-11-27 DIAGNOSIS — D509 Iron deficiency anemia, unspecified: Secondary | ICD-10-CM | POA: Insufficient documentation

## 2014-11-27 DIAGNOSIS — I4891 Unspecified atrial fibrillation: Secondary | ICD-10-CM | POA: Diagnosis not present

## 2014-11-27 DIAGNOSIS — Z7982 Long term (current) use of aspirin: Secondary | ICD-10-CM | POA: Diagnosis not present

## 2014-11-27 DIAGNOSIS — Z7901 Long term (current) use of anticoagulants: Secondary | ICD-10-CM | POA: Diagnosis not present

## 2014-11-27 DIAGNOSIS — R634 Abnormal weight loss: Secondary | ICD-10-CM

## 2014-11-27 DIAGNOSIS — Z8719 Personal history of other diseases of the digestive system: Secondary | ICD-10-CM

## 2014-11-27 DIAGNOSIS — K529 Noninfective gastroenteritis and colitis, unspecified: Secondary | ICD-10-CM | POA: Diagnosis present

## 2014-11-27 DIAGNOSIS — E785 Hyperlipidemia, unspecified: Secondary | ICD-10-CM | POA: Insufficient documentation

## 2014-11-27 DIAGNOSIS — E119 Type 2 diabetes mellitus without complications: Secondary | ICD-10-CM | POA: Diagnosis not present

## 2014-11-27 DIAGNOSIS — Z888 Allergy status to other drugs, medicaments and biological substances status: Secondary | ICD-10-CM | POA: Insufficient documentation

## 2014-11-27 DIAGNOSIS — I2510 Atherosclerotic heart disease of native coronary artery without angina pectoris: Secondary | ICD-10-CM | POA: Diagnosis not present

## 2014-11-27 DIAGNOSIS — R197 Diarrhea, unspecified: Secondary | ICD-10-CM

## 2014-11-27 HISTORY — PX: COLONOSCOPY: SHX5424

## 2014-11-27 LAB — CBC
HCT: 35.2 % — ABNORMAL LOW (ref 39.0–52.0)
Hemoglobin: 11.6 g/dL — ABNORMAL LOW (ref 13.0–17.0)
MCH: 30.9 pg (ref 26.0–34.0)
MCHC: 33 g/dL (ref 30.0–36.0)
MCV: 93.6 fL (ref 78.0–100.0)
Platelets: 254 10*3/uL (ref 150–400)
RBC: 3.76 MIL/uL — AB (ref 4.22–5.81)
RDW: 14.8 % (ref 11.5–15.5)
WBC: 5.2 10*3/uL (ref 4.0–10.5)

## 2014-11-27 LAB — GLUCOSE, CAPILLARY: Glucose-Capillary: 106 mg/dL — ABNORMAL HIGH (ref 70–99)

## 2014-11-27 SURGERY — COLONOSCOPY
Anesthesia: Moderate Sedation

## 2014-11-27 MED ORDER — MEPERIDINE HCL 50 MG/ML IJ SOLN
INTRAMUSCULAR | Status: DC | PRN
  Administered 2014-11-27: 25 mg via INTRAVENOUS

## 2014-11-27 MED ORDER — MEPERIDINE HCL 50 MG/ML IJ SOLN
INTRAMUSCULAR | Status: DC
Start: 2014-11-27 — End: 2014-11-27
  Filled 2014-11-27: qty 1

## 2014-11-27 MED ORDER — MIDAZOLAM HCL 5 MG/5ML IJ SOLN
INTRAMUSCULAR | Status: DC
Start: 2014-11-27 — End: 2014-11-27
  Filled 2014-11-27: qty 10

## 2014-11-27 MED ORDER — MIDAZOLAM HCL 5 MG/5ML IJ SOLN
INTRAMUSCULAR | Status: DC | PRN
  Administered 2014-11-27 (×2): 2 mg via INTRAVENOUS

## 2014-11-27 MED ORDER — SODIUM CHLORIDE 0.9 % IV SOLN
INTRAVENOUS | Status: DC
  Administered 2014-11-27: 08:00:00 via INTRAVENOUS

## 2014-11-27 MED ORDER — STERILE WATER FOR IRRIGATION IR SOLN
Status: DC | PRN
  Administered 2014-11-27: 09:00:00

## 2014-11-27 NOTE — Op Note (Signed)
COLONOSCOPY PROCEDURE REPORT  PATIENT:  [name]  MR#:  [id number] Birthdate:  1936-02-22, 79 y.o., male Endoscopist:  Dr. Rogene Houston, MD Referred By:  Dr. Robert Bellow, MD  Procedure Date: 11/27/2014  Procedure:   Colonoscopy  Indications:  Patient is 42 old Caucasian male who was recently evaluated for chronic diarrhea and close to 50 pound weight loss. Diarrhea has finally almost resolved. He was also noted to be anemic and iron studies suggest iron deficiency anemia. He is therefore undergoing diagnostic colonoscopy.  Informed Consent:  The procedure and risks were reviewed with the patient and informed consent was obtained.  Medications:  Demerol 25 mg IV Versed 4 mg IV  Description of procedure:  After a digital rectal exam was performed, that colonoscope was advanced from the anus through the rectum and colon to the area of the cecum, ileocecal valve and appendiceal orifice. The cecum was deeply intubated. These structures were well-seen and photographed for the record. From the level of the cecum and ileocecal valve, the scope was slowly and cautiously withdrawn. The mucosal surfaces were carefully surveyed utilizing scope tip to flexion to facilitate fold flattening as needed. The scope was pulled down into the rectum where a thorough exam including retroflexion was performed.  Findings:   Prep satisfactory. Normal colonic mucosa without changes of colitis. Single small polyp at transverse colon. It was ablated via cold biopsy. Normal rectal mucosa. Small hemorrhoids above and below the dentate line.   Therapeutic/Diagnostic Maneuvers Performed:  See above  Complications:  None  Cecal Withdrawal Time:  11 minutes  Impression:  Examination performed to cecum. No evidence of endoscopic colitis. Small polyp ablated via cold biopsy from transverse colon. Small internal and external hemorrhoids.  Recommendations:  Standard instructions given. Will check  CBC today. Patient will resume usual medications including warfarin. Patient advised to let his cardiologist know that he is not taking Spironolactone.  I will contact patient with biopsy results and further recommendations.  Elpidia Karn U  11/27/2014 9:03 AM  CC: Dr. Robert Bellow, MD & Dr. Rayne Du ref. provider found

## 2014-11-27 NOTE — Discharge Instructions (Signed)
Resume usual medications and diet. Please let your cardiologist know that you cannot take spironolactone. No driving for 24 hours. Patient will call with results of biopsy and blood test.  Colonoscopy, Care After Refer to this sheet in the next few weeks. These instructions provide you with information on caring for yourself after your procedure. Your health care provider may also give you more specific instructions. Your treatment has been planned according to current medical practices, but problems sometimes occur. Call your health care provider if you have any problems or questions after your procedure. WHAT TO EXPECT AFTER THE PROCEDURE  After your procedure, it is typical to have the following:  A small amount of blood in your stool.  Moderate amounts of gas and mild abdominal cramping or bloating. HOME CARE INSTRUCTIONS  Do not drive, operate machinery, or sign important documents for 24 hours.  You may shower and resume your regular physical activities, but move at a slower pace for the first 24 hours.  Take frequent rest periods for the first 24 hours.  Walk around or put a warm pack on your abdomen to help reduce abdominal cramping and bloating.  Drink enough fluids to keep your urine clear or pale yellow.  You may resume your normal diet as instructed by your health care provider. Avoid heavy or fried foods that are hard to digest.  Avoid drinking alcohol for 24 hours or as instructed by your health care provider.  Only take over-the-counter or prescription medicines as directed by your health care provider.  If a tissue sample (biopsy) was taken during your procedure:  Do not take aspirin or blood thinners for 7 days, or as instructed by your health care provider.  Do not drink alcohol for 7 days, or as instructed by your health care provider.  Eat soft foods for the first 24 hours. SEEK MEDICAL CARE IF: You have persistent spotting of blood in your stool 2-3 days  after the procedure. SEEK IMMEDIATE MEDICAL CARE IF:  You have more than a small spotting of blood in your stool.  You pass large blood clots in your stool.  Your abdomen is swollen (distended).  You have nausea or vomiting.  You have a fever.  You have increasing abdominal pain that is not relieved with medicine. Document Released: 05/23/2004 Document Revised: 07/30/2013 Document Reviewed: 06/16/2013 [facility] [place] Patient Information 2015 [place], [place]. This information is not intended to replace advice given to you by your health care provider. Make sure you discuss any questions you have with your health care provider.

## 2014-11-27 NOTE — H&P (Signed)
[name] is an 79 y.o. male.   Chief Complaint: Patient is here for colonoscopy. HPI: Patient is 56 old Caucasian male who presents with several month history of diarrhea associated with 50 pound weight loss. He was also noted to have anemia with low iron and iron saturation. His diarrhea has improved great deal. He's not having to 3 stools daily he denies melena or rectal bleeding. His appetite is not back to normal. He also denies heartburn nausea vomiting dysphagia. Patient has been off warfarin for 5 days. Last colonoscopy was in 2008. Family history is negative for CRC. States his granddaughter has gluten intolerance or celiac disease.  Past Medical History  Diagnosis Date  . Hyperlipidemia   . Hypertension   . Arteriosclerotic cardiovascular disease (ASCVD)     CABG in 1990s; 2002 total obstruction of LAD, CX and RCA with patent grafts and nl EF; Stress nuc. 2008 - mild LV dilation; normal EF; questionable small anteroapical scar; no ischemia  . Atrial fibrillation   . Squamous cell carcinoma     Left jaw, resected in 1990s  . Foot ulcer     Left with associated osteomyelitis; left leg cellulitis in 2008;  . Tibia fracture     Left  . Pedal edema   . Chronic anticoagulation   . Diabetes mellitus without complication   . Myocardial infarction   . Peripheral vascular disease   . CAD (coronary artery disease)     Past Surgical History  Procedure Laterality Date  . Coronary artery bypass graft  1990's  . Squamous cell carcinoma excision  1993    Left lower face  . Tonsillectomy    . Orif tibia fracture      left    Family History  Problem Relation Age of Onset  . Heart disease Other   . Heart disease Mother    Social History:  reports that he has never smoked. He has never used smokeless tobacco. He reports that he does not drink alcohol or use illicit drugs.  Allergies:  Allergies  Allergen Reactions  . Simvastatin Other (See Comments)    Myalgias also  .  Amoxicillin   . Lipitor [Atorvastatin]     DIZZYNESS  . Sulfonamide Derivatives     Medications Prior to Admission  Medication Sig Dispense Refill  . amLODipine (NORVASC) 10 MG tablet Take 1 tablet (10 mg total) by mouth daily. 180 tablet 3  . aspirin 81 MG tablet Take 81 mg by mouth daily.    . cloNIDine (CATAPRES - DOSED IN MG/24 HR) 0.3 mg/24hr patch Place 1 patch (0.3 mg total) onto the skin once a week. (Patient taking differently: Place 0.3 mg onto the skin once a week. Changes on Wednesdays.) 4 patch 12  . ferrous sulfate 325 (65 FE) MG tablet Take 65 mg by mouth daily with breakfast.    . fish oil-omega-3 fatty acids 1000 MG capsule Take 1 capsule by mouth daily.      Marland Kitchen losartan-hydrochlorothiazide (HYZAAR) 100-25 MG per tablet Take 1 tablet by mouth daily.      . Multiple Vitamins-Minerals (CENTRUM SILVER PO) Take 1 tablet by mouth daily.      . nebivolol (BYSTOLIC) 10 MG tablet TAKE ONE TABLET BY MOUTH EVERY DAY 14 tablet 0  . peg 3350 powder (MOVIPREP) 100 G SOLR Take 1 kit (200 g total) by mouth once. 1 kit 0  . Probiotic Product (PHILLIPS COLON HEALTH PO) Take 1 capsule by mouth daily.     Marland Kitchen  warfarin (COUMADIN) 2.5 MG tablet Take 1 tablet daily or as directed by coumadin clinic (Patient taking differently: Take 1.25-2.5 mg by mouth See admin instructions. Take 1 tablet M-;W-F. 1/2 tab the rest of days.) 90 tablet 3  . spironolactone (ALDACTONE) 25 MG tablet Take 1 tablet (25 mg total) by mouth daily. (Patient not taking: Reported on 11/18/2014) 90 tablet 1    Results for orders placed or performed during the hospital encounter of 11/27/14 (from the past 48 hour(s))  Glucose, capillary     Status: Abnormal   Collection Time: 11/27/14  8:01 AM  Result Value Ref Range   Glucose-Capillary 106 (H) 70 - 99 mg/dL   Comment 1 Documented in Chart    No results found.  ROS  Blood pressure 153/68, pulse 59, temperature 97.5 F (36.4 C), temperature source Oral, resp. rate 19,  height 5' 9"  (1.753 m), weight 182 lb (82.555 kg), SpO2 99 %. Physical Exam  Constitutional: He appears well-developed and well-nourished.  HENT:  Mouth/Throat: Oropharynx is clear and moist.  Eyes: Conjunctivae are normal. No scleral icterus.  Neck: No tracheal deviation present. No thyromegaly present.  Cardiovascular:  Irregular rhythm normal S1 and S2. Faint systolic ejection murmur best heard at aortic area.  Respiratory: Effort normal and breath sounds normal.  GI: He exhibits no distension and no mass. There is no tenderness.  Musculoskeletal: He exhibits no edema.  Lymphadenopathy:    He has no cervical adenopathy.  Neurological: He is alert.  Skin: Skin is warm and dry.     Assessment/Plan History of diarrhea. Iron deficiency anemia. Diagnostic colonoscopy.  [name] U 11/27/2014, 8:25 AM

## 2014-11-30 ENCOUNTER — Encounter (HOSPITAL_COMMUNITY): Payer: Self-pay | Admitting: Internal Medicine

## 2014-12-01 ENCOUNTER — Other Ambulatory Visit: Payer: Self-pay | Admitting: Cardiovascular Disease

## 2014-12-01 ENCOUNTER — Telehealth (INDEPENDENT_AMBULATORY_CARE_PROVIDER_SITE_OTHER): Payer: Self-pay | Admitting: *Deleted

## 2014-12-01 DIAGNOSIS — D509 Iron deficiency anemia, unspecified: Secondary | ICD-10-CM

## 2014-12-01 NOTE — Telephone Encounter (Signed)
Per Dr.Rehman the patient will need to have labs drawn in 1 month.

## 2014-12-02 ENCOUNTER — Ambulatory Visit (INDEPENDENT_AMBULATORY_CARE_PROVIDER_SITE_OTHER): Payer: Medicare Other | Admitting: *Deleted

## 2014-12-02 DIAGNOSIS — Z7901 Long term (current) use of anticoagulants: Secondary | ICD-10-CM

## 2014-12-02 DIAGNOSIS — I4891 Unspecified atrial fibrillation: Secondary | ICD-10-CM

## 2014-12-02 DIAGNOSIS — I480 Paroxysmal atrial fibrillation: Secondary | ICD-10-CM

## 2014-12-02 DIAGNOSIS — Z5181 Encounter for therapeutic drug level monitoring: Secondary | ICD-10-CM

## 2014-12-02 LAB — POCT INR: INR: 1.8

## 2014-12-02 NOTE — Telephone Encounter (Signed)
error 

## 2014-12-09 ENCOUNTER — Encounter (INDEPENDENT_AMBULATORY_CARE_PROVIDER_SITE_OTHER): Payer: Self-pay | Admitting: *Deleted

## 2014-12-10 ENCOUNTER — Telehealth (INDEPENDENT_AMBULATORY_CARE_PROVIDER_SITE_OTHER): Payer: Self-pay | Admitting: *Deleted

## 2014-12-10 ENCOUNTER — Encounter (INDEPENDENT_AMBULATORY_CARE_PROVIDER_SITE_OTHER): Payer: Self-pay | Admitting: *Deleted

## 2014-12-10 DIAGNOSIS — D509 Iron deficiency anemia, unspecified: Secondary | ICD-10-CM

## 2014-12-10 NOTE — Telephone Encounter (Signed)
Lab order released.

## 2014-12-23 ENCOUNTER — Ambulatory Visit (INDEPENDENT_AMBULATORY_CARE_PROVIDER_SITE_OTHER): Payer: Medicare Other | Admitting: *Deleted

## 2014-12-23 DIAGNOSIS — I4891 Unspecified atrial fibrillation: Secondary | ICD-10-CM

## 2014-12-23 DIAGNOSIS — Z7901 Long term (current) use of anticoagulants: Secondary | ICD-10-CM

## 2014-12-23 DIAGNOSIS — Z5181 Encounter for therapeutic drug level monitoring: Secondary | ICD-10-CM

## 2014-12-23 DIAGNOSIS — I480 Paroxysmal atrial fibrillation: Secondary | ICD-10-CM

## 2014-12-23 LAB — POCT INR: INR: 2.4

## 2014-12-30 LAB — CBC
HCT: 36.4 % — ABNORMAL LOW (ref 39.0–52.0)
HEMOGLOBIN: 12.2 g/dL — AB (ref 13.0–17.0)
MCH: 31.4 pg (ref 26.0–34.0)
MCHC: 33.5 g/dL (ref 30.0–36.0)
MCV: 93.6 fL (ref 78.0–100.0)
MPV: 10 fL (ref 8.6–12.4)
Platelets: 293 10*3/uL (ref 150–400)
RBC: 3.89 MIL/uL — ABNORMAL LOW (ref 4.22–5.81)
RDW: 14.8 % (ref 11.5–15.5)
WBC: 5.8 10*3/uL (ref 4.0–10.5)

## 2015-01-06 ENCOUNTER — Telehealth (INDEPENDENT_AMBULATORY_CARE_PROVIDER_SITE_OTHER): Payer: Self-pay | Admitting: *Deleted

## 2015-01-06 DIAGNOSIS — D509 Iron deficiency anemia, unspecified: Secondary | ICD-10-CM

## 2015-01-06 NOTE — Telephone Encounter (Signed)
Lab report faxed to PCP

## 2015-01-06 NOTE — Telephone Encounter (Signed)
Per Dr.Rehman the patient will need to have labs drawn in 3 months 

## 2015-01-06 NOTE — Telephone Encounter (Signed)
Copy previous results to PCP per NUR.

## 2015-02-01 ENCOUNTER — Encounter: Payer: Self-pay | Admitting: Cardiovascular Disease

## 2015-02-01 ENCOUNTER — Ambulatory Visit (INDEPENDENT_AMBULATORY_CARE_PROVIDER_SITE_OTHER): Payer: Medicare Other | Admitting: *Deleted

## 2015-02-01 ENCOUNTER — Ambulatory Visit (INDEPENDENT_AMBULATORY_CARE_PROVIDER_SITE_OTHER): Payer: Medicare Other | Admitting: Cardiovascular Disease

## 2015-02-01 VITALS — BP 130/64 | HR 60 | Ht 69.0 in | Wt 182.4 lb

## 2015-02-01 DIAGNOSIS — R197 Diarrhea, unspecified: Secondary | ICD-10-CM

## 2015-02-01 DIAGNOSIS — E782 Mixed hyperlipidemia: Secondary | ICD-10-CM

## 2015-02-01 DIAGNOSIS — I739 Peripheral vascular disease, unspecified: Secondary | ICD-10-CM | POA: Diagnosis not present

## 2015-02-01 DIAGNOSIS — I25810 Atherosclerosis of coronary artery bypass graft(s) without angina pectoris: Secondary | ICD-10-CM

## 2015-02-01 DIAGNOSIS — I10 Essential (primary) hypertension: Secondary | ICD-10-CM

## 2015-02-01 DIAGNOSIS — Z5181 Encounter for therapeutic drug level monitoring: Secondary | ICD-10-CM

## 2015-02-01 DIAGNOSIS — I482 Chronic atrial fibrillation, unspecified: Secondary | ICD-10-CM

## 2015-02-01 DIAGNOSIS — I4891 Unspecified atrial fibrillation: Secondary | ICD-10-CM

## 2015-02-01 DIAGNOSIS — Z951 Presence of aortocoronary bypass graft: Secondary | ICD-10-CM

## 2015-02-01 DIAGNOSIS — Z7901 Long term (current) use of anticoagulants: Secondary | ICD-10-CM

## 2015-02-01 DIAGNOSIS — I480 Paroxysmal atrial fibrillation: Secondary | ICD-10-CM

## 2015-02-01 LAB — POCT INR: INR: 1.9

## 2015-02-01 NOTE — Patient Instructions (Signed)
Your physician wants you to follow-up in: 1 year with Dr Virgina Jock will receive a reminder letter in the mail two months in advance. If you don't receive a letter, please call our office to schedule the follow-up appointment.    Your physician recommends that you continue on your current medications as directed. Please refer to the Current Medication list given to you today.     Thank you for choosing [facility] !

## 2015-02-01 NOTE — Progress Notes (Signed)
Patient ID: [name], male   DOB: [birth year], 79 y.o.   MRN: [id number]      SUBJECTIVE: [name] is a 79 yr old male with atrial fibrillation and on anticoagulation, multiple cardiovascular risk factors including diabetes, hypertension and hyperlipidemia and known coronary artery and peripheral vascular disease, with a history of CABG and MI in his 3's. He denies chest pain, shortness of breath, abdominal pain, leg swelling, dizziness, and syncope.  Ever since he started probiotic, his diarrhea has completely dissipated. He feels well overall. He is taking supplemental iron as prescribed by his PCP.   Review of Systems: As per "subjective", otherwise negative.  Allergies  Allergen Reactions  . Simvastatin Other (See Comments)    Myalgias also  . Amoxicillin   . Lipitor [Atorvastatin]     DIZZYNESS  . Sulfonamide Derivatives     Current Outpatient Prescriptions  Medication Sig Dispense Refill  . amLODipine (NORVASC) 10 MG tablet Take 1 tablet (10 mg total) by mouth daily. 180 tablet 3  . aspirin 81 MG tablet Take 81 mg by mouth daily.    . cloNIDine (CATAPRES - DOSED IN MG/24 HR) 0.3 mg/24hr patch APPLY 1 PATCH ONCE WEEKLY 12 patch 3  . ferrous sulfate 325 (65 FE) MG tablet Take 65 mg by mouth daily with breakfast.    . fish oil-omega-3 fatty acids 1000 MG capsule Take 1 capsule by mouth daily.      Marland Kitchen losartan-hydrochlorothiazide (HYZAAR) 100-25 MG per tablet Take 1 tablet by mouth daily.      . Multiple Vitamins-Minerals (CENTRUM SILVER PO) Take 1 tablet by mouth daily.      . nebivolol (BYSTOLIC) 10 MG tablet TAKE ONE TABLET BY MOUTH EVERY DAY 14 tablet 0  . peg 3350 powder (MOVIPREP) 100 G SOLR Take 1 kit (200 g total) by mouth once. 1 kit 0  . Probiotic Product (PHILLIPS COLON HEALTH PO) Take 1 capsule by mouth daily.     Marland Kitchen warfarin (COUMADIN) 2.5 MG tablet Take 1 tablet daily or as directed by coumadin clinic (Patient taking differently: Take 1.25-2.5 mg by mouth See  admin instructions. Take 1 tablet M-;W-F. 1/2 tab the rest of days.) 90 tablet 3   No current facility-administered medications for this visit.    Past Medical History  Diagnosis Date  . Hyperlipidemia   . Hypertension   . Arteriosclerotic cardiovascular disease (ASCVD)     CABG in 1990s; 2002 total obstruction of LAD, CX and RCA with patent grafts and nl EF; Stress nuc. 2008 - mild LV dilation; normal EF; questionable small anteroapical scar; no ischemia  . Atrial fibrillation   . Squamous cell carcinoma     Left jaw, resected in 1990s  . Foot ulcer     Left with associated osteomyelitis; left leg cellulitis in 2008;  . Tibia fracture     Left  . Pedal edema   . Chronic anticoagulation   . Diabetes mellitus without complication   . Myocardial infarction   . Peripheral vascular disease   . CAD (coronary artery disease)     Past Surgical History  Procedure Laterality Date  . Coronary artery bypass graft  1990's  . Squamous cell carcinoma excision  1993    Left lower face  . Tonsillectomy    . Orif tibia fracture      left  . Colonoscopy N/A 11/27/2014    Procedure: COLONOSCOPY;  Surgeon: Rogene Houston, MD;  Location: [facility];  Service: Endoscopy;  Laterality: N/A;  830    History   Social History  . Marital Status: Married    Spouse Name: N/A  . Number of Children: N/A  . Years of Education: N/A   Occupational History  . Not on file.   Social History Main Topics  . Smoking status: Never Smoker   . Smokeless tobacco: Never Used  . Alcohol Use: No  . Drug Use: No  . Sexual Activity: Not on file   Other Topics Concern  . Not on file   Social History Narrative     Filed Vitals:   02/01/15 0830  BP: 130/64  Pulse: 60  Height: 5' 9"  (1.753 m)  Weight: 182 lb 6.4 oz (82.736 kg)  SpO2: 99%    PHYSICAL EXAM General: NAD HEENT: Normal. Neck: No JVD, no thyromegaly. Lungs: Clear to auscultation bilaterally with normal respiratory effort. CV:  Nondisplaced PMI. Irregular rhythm, normal S1/S2, no S3, no murmur. No pedal edema. No carotid bruit.  Abdomen: Soft, nontender, no distention.  Neurologic: Alert and oriented.  Psych: Normal affect. Skin: Chronic venous stasis dermatitis b/l LE's. Musculoskeletal: Normal range of motion, no gross deformities. Extremities: No clubbing or cyanosis.   ECG: Most recent ECG reviewed.      ASSESSMENT AND PLAN: 1. CAD: Stable ischemic heart disease. Taking Bystolic and ASA. Unable to tolerate statins.  2. Atrial fibrillation: Rate controlled on Bystolic and anticoagulated with warfarin. INR slightly subtherapeutic at 1.9 today. 3. Essential HTN: Controlled on current therapy which includes amlodipine, Bystolic, clonidine, and Hyzaar.  4. Hyperlipidemia: Unable to tolerate statins. Obtain lipid panel results from PCP's office. Possibly a candidate for PCSK-9 inhibitor. 5. PVD: Continue ASA, unable to tolerate statins. Has infrainguinal arterial occlusive disease on the left, and consideration for arteriography and revascularization will only be given if he developed a nonhealing wound or significant rest pain as per vascular surgery. 6. Diarrhea: Resolved with probiotic. Unremarkable colonoscopy with a single polyp.  Dispo: f/u 1 year.  [name], M.D., F.A.C.C.

## 2015-02-08 ENCOUNTER — Other Ambulatory Visit: Payer: Self-pay | Admitting: Cardiovascular Disease

## 2015-02-15 ENCOUNTER — Other Ambulatory Visit: Payer: Self-pay | Admitting: Cardiovascular Disease

## 2015-02-15 MED ORDER — NEBIVOLOL HCL 10 MG PO TABS: ORAL_TABLET | ORAL | Status: DC

## 2015-02-15 NOTE — Telephone Encounter (Signed)
Please send refill on Bystolic 10 mg to [facility] / tg

## 2015-03-01 ENCOUNTER — Ambulatory Visit (INDEPENDENT_AMBULATORY_CARE_PROVIDER_SITE_OTHER): Payer: Medicare Other | Admitting: *Deleted

## 2015-03-01 DIAGNOSIS — I4891 Unspecified atrial fibrillation: Secondary | ICD-10-CM

## 2015-03-01 DIAGNOSIS — Z5181 Encounter for therapeutic drug level monitoring: Secondary | ICD-10-CM

## 2015-03-01 DIAGNOSIS — Z7901 Long term (current) use of anticoagulants: Secondary | ICD-10-CM | POA: Diagnosis not present

## 2015-03-01 LAB — POCT INR: INR: 1.8

## 2015-03-01 MED ORDER — AMLODIPINE BESYLATE 10 MG PO TABS
10.0000 mg | ORAL_TABLET | Freq: Every day | ORAL | Status: DC
Start: 2015-03-01 — End: 2016-01-21

## 2015-03-17 ENCOUNTER — Encounter (INDEPENDENT_AMBULATORY_CARE_PROVIDER_SITE_OTHER): Payer: Self-pay | Admitting: *Deleted

## 2015-03-17 ENCOUNTER — Ambulatory Visit (INDEPENDENT_AMBULATORY_CARE_PROVIDER_SITE_OTHER): Payer: Medicare Other | Admitting: *Deleted

## 2015-03-17 ENCOUNTER — Other Ambulatory Visit (INDEPENDENT_AMBULATORY_CARE_PROVIDER_SITE_OTHER): Payer: Self-pay | Admitting: *Deleted

## 2015-03-17 DIAGNOSIS — Z5181 Encounter for therapeutic drug level monitoring: Secondary | ICD-10-CM

## 2015-03-17 DIAGNOSIS — I4891 Unspecified atrial fibrillation: Secondary | ICD-10-CM

## 2015-03-17 DIAGNOSIS — D509 Iron deficiency anemia, unspecified: Secondary | ICD-10-CM

## 2015-03-17 DIAGNOSIS — Z7901 Long term (current) use of anticoagulants: Secondary | ICD-10-CM

## 2015-03-17 LAB — POCT INR: INR: 2.3

## 2015-03-24 ENCOUNTER — Other Ambulatory Visit: Payer: Self-pay | Admitting: Cardiovascular Disease

## 2015-03-24 MED ORDER — CLONIDINE HCL 0.3 MG/24HR TD PTWK: MEDICATED_PATCH | TRANSDERMAL | Status: DC

## 2015-03-24 NOTE — Telephone Encounter (Signed)
Needs refill on Clonidin TTS DIS 0.3/24 HR sent to [facility] / tg

## 2015-04-01 ENCOUNTER — Encounter (INDEPENDENT_AMBULATORY_CARE_PROVIDER_SITE_OTHER): Payer: Self-pay

## 2015-04-07 ENCOUNTER — Ambulatory Visit (INDEPENDENT_AMBULATORY_CARE_PROVIDER_SITE_OTHER): Payer: Medicare Other | Admitting: *Deleted

## 2015-04-07 DIAGNOSIS — Z7901 Long term (current) use of anticoagulants: Secondary | ICD-10-CM

## 2015-04-07 DIAGNOSIS — Z5181 Encounter for therapeutic drug level monitoring: Secondary | ICD-10-CM | POA: Diagnosis not present

## 2015-04-07 DIAGNOSIS — I4891 Unspecified atrial fibrillation: Secondary | ICD-10-CM | POA: Diagnosis not present

## 2015-04-07 LAB — POCT INR: INR: 1.9

## 2015-04-28 ENCOUNTER — Ambulatory Visit (INDEPENDENT_AMBULATORY_CARE_PROVIDER_SITE_OTHER): Payer: Medicare Other | Admitting: *Deleted

## 2015-04-28 DIAGNOSIS — Z5181 Encounter for therapeutic drug level monitoring: Secondary | ICD-10-CM | POA: Diagnosis not present

## 2015-04-28 DIAGNOSIS — I4891 Unspecified atrial fibrillation: Secondary | ICD-10-CM

## 2015-04-28 DIAGNOSIS — Z7901 Long term (current) use of anticoagulants: Secondary | ICD-10-CM

## 2015-04-28 LAB — POCT INR: INR: 2.3

## 2015-05-26 ENCOUNTER — Ambulatory Visit (INDEPENDENT_AMBULATORY_CARE_PROVIDER_SITE_OTHER): Payer: Medicare Other | Admitting: *Deleted

## 2015-05-26 DIAGNOSIS — I4891 Unspecified atrial fibrillation: Secondary | ICD-10-CM | POA: Diagnosis not present

## 2015-05-26 DIAGNOSIS — Z5181 Encounter for therapeutic drug level monitoring: Secondary | ICD-10-CM | POA: Diagnosis not present

## 2015-05-26 DIAGNOSIS — Z7901 Long term (current) use of anticoagulants: Secondary | ICD-10-CM | POA: Diagnosis not present

## 2015-05-26 LAB — POCT INR: INR: 2.8

## 2015-05-28 ENCOUNTER — Encounter: Payer: Self-pay | Admitting: Cardiology

## 2015-05-28 ENCOUNTER — Ambulatory Visit: Payer: Medicare Other | Admitting: Cardiovascular Disease

## 2015-05-28 ENCOUNTER — Ambulatory Visit (INDEPENDENT_AMBULATORY_CARE_PROVIDER_SITE_OTHER): Payer: Medicare Other | Admitting: Cardiology

## 2015-05-28 VITALS — BP 124/62 | HR 59 | Ht 69.0 in | Wt 190.0 lb

## 2015-05-28 DIAGNOSIS — I482 Chronic atrial fibrillation: Secondary | ICD-10-CM | POA: Diagnosis not present

## 2015-05-28 DIAGNOSIS — Z7901 Long term (current) use of anticoagulants: Secondary | ICD-10-CM | POA: Diagnosis not present

## 2015-05-28 DIAGNOSIS — Z951 Presence of aortocoronary bypass graft: Secondary | ICD-10-CM | POA: Diagnosis not present

## 2015-05-28 DIAGNOSIS — R001 Bradycardia, unspecified: Secondary | ICD-10-CM

## 2015-05-28 DIAGNOSIS — I25810 Atherosclerosis of coronary artery bypass graft(s) without angina pectoris: Secondary | ICD-10-CM | POA: Diagnosis not present

## 2015-05-28 DIAGNOSIS — E785 Hyperlipidemia, unspecified: Secondary | ICD-10-CM

## 2015-05-28 DIAGNOSIS — I4821 Permanent atrial fibrillation: Secondary | ICD-10-CM

## 2015-05-28 MED ORDER — NEBIVOLOL HCL 5 MG PO TABS: 5.0000 mg | ORAL_TABLET | Freq: Every day | ORAL | Status: DC

## 2015-05-28 NOTE — Assessment & Plan Note (Signed)
+  Peripheral neuropathy

## 2015-05-28 NOTE — Progress Notes (Signed)
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 He has CAF and is asymptomatic. He is on Coumadin. He has had slow rates noted on prior EKGs. Recently his BP machine at home has shown rates in the 30's. He brought in three pages of his readings going back to June that document a gradual trend toward more pronounce bradycardia.  Fortunately he remains asymptomatic. He denies any syncope, pre syncope, or unusual fatigue or DOE. He remains active.    Current Outpatient Prescriptions  Medication Sig Dispense Refill  . amLODipine (NORVASC) 10 MG tablet Take 1 tablet (10 mg total) by mouth daily. 90 tablet 3  . aspirin 81 MG tablet Take 81 mg by mouth daily.    . cloNIDine (CATAPRES - DOSED IN MG/24 HR) 0.3 mg/24hr patch APPLY 1 PATCH ONCE WEEKLY 12 patch 3  . ferrous sulfate 325 (65 FE) MG tablet Take 65 mg by mouth daily with breakfast.    . fish oil-omega-3 fatty acids 1000 MG capsule Take 1 capsule by mouth daily.      Marland Kitchen JANTOVEN 2.5 MG tablet TAKE 1 TABLET DAILY OR AS  DIRECTED BY COUMADIN CLINIC 90 tablet 3  . losartan-hydrochlorothiazide (HYZAAR) 100-25 MG per tablet Take 1 tablet by mouth daily.      . Multiple Vitamins-Minerals (CENTRUM SILVER PO) Take 1 tablet by mouth daily.      . Probiotic Product (PHILLIPS COLON HEALTH PO) Take 1 capsule by mouth daily.     . nebivolol (BYSTOLIC) 5 MG tablet Take 1 tablet (5 mg total) by mouth daily. 90 tablet 3   No current facility-administered medications for this visit.    Allergies  Allergen Reactions  . Simvastatin Other (See Comments)    Myalgias also  . Amoxicillin   . Lipitor [Atorvastatin]     DIZZYNESS  . Sulfonamide Derivatives     History   Social History  . Marital Status: Married    Spouse Name: N/A  . Number of  Children: N/A  . Years of Education: N/A   Occupational History  . Not on file.   Social History Main Topics  . Smoking status: Never Smoker   . Smokeless tobacco: Never Used  . Alcohol Use: No  . Drug Use: No  . Sexual Activity: Not on file   Other Topics Concern  . Not on file   Social History Narrative     Review of Systems: General: negative for chills, fever, night sweats or weight changes.  Cardiovascular: negative for chest pain, dyspnea on exertion, edema, orthopnea, palpitations, paroxysmal nocturnal dyspnea or shortness of breath Dermatological: negative for rash Respiratory: negative for cough or wheezing Urologic: negative for hematuria Abdominal: negative for nausea, vomiting, diarrhea, bright red blood per rectum, melena, or hematemesis Neurologic: negative for visual changes, syncope, or dizziness All other systems reviewed and are otherwise negative except as noted above.    Blood pressure 124/62, pulse 59, height 5' 9"  (1.753 m), weight 190 lb (86.183 kg), SpO2 98 %.  General appearance: alert, cooperative and no distress Neck: no JVD Lungs: clear to auscultation bilaterally Heart: irregularly irregular rhythm Skin: Skin color, texture, turgor normal. No rashes or lesions Neurologic: Grossly normal   ASSESSMENT AND PLAN:   Permanent atrial fibrillation .  Chronic anticoagulation Warfarin  Hx of CABG CABG in the 1990s; 2002 patent grafts and nl EF; Stress nuc. 2008-mild LV dilatation; normal EF,  no ischemia  Diabetes mellitus +Peripheral neuropathy  Dyslipidemia Statin intolerant   PLAN  I cut Mr Coda Mathey to 5 mg daily. He'll let us know if he develops any symptoms suggestive of symptomatic bradycardia.   Kerin Ransom K PA-C 05/28/2015 4:09 PM

## 2015-05-28 NOTE — Assessment & Plan Note (Signed)
Statin intolerant

## 2015-05-28 NOTE — Assessment & Plan Note (Signed)
CABG in the 1990s; 2002 patent grafts and nl EF; Stress nuc. 2008-mild LV dilatation; normal EF,  no ischemia

## 2015-05-28 NOTE — Patient Instructions (Signed)
Your physician recommends that you schedule a follow-up appointment in: 2 weeks with Arnold Long, NP.  Your physician has recommended you make the following change in your medication:   Decrease Bystolic to 5 mg Daily  Thank you for choosing [facility]!

## 2015-05-28 NOTE — Assessment & Plan Note (Signed)
Warfarin 

## 2015-06-11 ENCOUNTER — Telehealth: Payer: Self-pay | Admitting: Adult Health

## 2015-06-11 ENCOUNTER — Encounter: Payer: Self-pay | Admitting: Adult Health

## 2015-06-11 ENCOUNTER — Ambulatory Visit (INDEPENDENT_AMBULATORY_CARE_PROVIDER_SITE_OTHER): Payer: Medicare Other | Admitting: Adult Health

## 2015-06-11 VITALS — BP 130/70 | HR 57 | Ht 68.5 in | Wt 190.0 lb

## 2015-06-11 DIAGNOSIS — I10 Essential (primary) hypertension: Secondary | ICD-10-CM

## 2015-06-11 DIAGNOSIS — I25810 Atherosclerosis of coronary artery bypass graft(s) without angina pectoris: Secondary | ICD-10-CM

## 2015-06-11 NOTE — Progress Notes (Signed)
Name: [name]    DOB: [birth year]  Age: 79 y.o.  MR#: [id number]       PCP:  Robert Bellow, MD      Insurance: Payor: MEDICARE / Plan: MEDICARE PART A AND B / Product Type: *No Product type* /   CC:   No chief complaint on file.   VS Filed Vitals:   06/11/15 1440  BP: 130/70  Pulse: 57  Height: 5' 8.5" (1.74 m)  Weight: 190 lb (86.183 kg)  SpO2: 98%    Weights Current Weight  06/11/15 190 lb (86.183 kg)  05/28/15 190 lb (86.183 kg)  02/01/15 182 lb 6.4 oz (82.736 kg)    Blood Pressure  BP Readings from Last 3 Encounters:  06/11/15 130/70  05/28/15 124/62  02/01/15 130/64     Admit date:  (Not on file) Last encounter with RMR:  Visit date not found   Allergy Simvastatin; Amoxicillin; Lipitor; and Sulfonamide derivatives  Current Outpatient Prescriptions  Medication Sig Dispense Refill  . amLODipine (NORVASC) 10 MG tablet Take 1 tablet (10 mg total) by mouth daily. 90 tablet 3  . aspirin 81 MG tablet Take 81 mg by mouth daily.    . cloNIDine (CATAPRES - DOSED IN MG/24 HR) 0.3 mg/24hr patch APPLY 1 PATCH ONCE WEEKLY 12 patch 3  . ferrous sulfate 325 (65 FE) MG tablet Take 65 mg by mouth daily with breakfast.    . fish oil-omega-3 fatty acids 1000 MG capsule Take 1 capsule by mouth daily.      Marland Kitchen JANTOVEN 2.5 MG tablet TAKE 1 TABLET DAILY OR AS  DIRECTED BY COUMADIN CLINIC 90 tablet 3  . losartan-hydrochlorothiazide (HYZAAR) 100-25 MG per tablet Take 1 tablet by mouth daily.      . Multiple Vitamins-Minerals (CENTRUM SILVER PO) Take 1 tablet by mouth daily.      . nebivolol (BYSTOLIC) 5 MG tablet Take 1 tablet (5 mg total) by mouth daily. 90 tablet 3  . Probiotic Product (PHILLIPS COLON HEALTH PO) Take 1 capsule by mouth daily.      No current facility-administered medications for this visit.    Discontinued Meds:   There are no discontinued medications.  Patient Active Problem List   Diagnosis Date Noted  . Bradycardia 05/28/2015  . PVD (peripheral  vascular disease) with claudication 09/30/2014  . Encounter for therapeutic drug monitoring 11/26/2013  . Peripheral vascular disease 08/06/2012  . Chronic anticoagulation 01/20/2011  . Diabetes mellitus 03/04/2010  . Dyslipidemia 03/04/2010  . HYPERTENSION 03/04/2010  . Hx of CABG 03/04/2010  . Permanent atrial fibrillation 04/08/2009    LABS    Component Value Date/Time   NA 134* 06/09/2013 1228   NA 134* 05/06/2013 1455   NA 140 10/04/2009   NA 140 10/04/2009   K 4.6 06/09/2013 1228   K 4.5 05/06/2013 1455   K 4.5 10/04/2009   K 4.5 10/04/2009   CL 103 06/09/2013 1228   CL 101 05/06/2013 1455   CL 106 10/04/2009   CL 106 10/04/2009   CO2 23 06/09/2013 1228   CO2 25 05/06/2013 1455   CO2 24 10/04/2009   CO2 24 10/04/2009   GLUCOSE 98 06/09/2013 1228   GLUCOSE 98 05/06/2013 1455   GLUCOSE 118 10/04/2009   GLUCOSE 118 10/04/2009   BUN 24* 06/09/2013 1228   BUN 22 05/06/2013 1455   BUN 20 10/04/2009   BUN 20 10/04/2009   CREATININE 0.84 06/09/2013 1228   CREATININE 0.90 05/06/2013 1455  CREATININE 0.91 10/04/2009   CREATININE 0.91 10/04/2009   CREATININE 0.87 05/21/2007 0550   CALCIUM 9.2 06/09/2013 1228   CALCIUM 9.3 05/06/2013 1455   CALCIUM 8.7 10/04/2009   CALCIUM 8.7 10/04/2009   GFRNONAA >60 05/21/2007 0550   GFRNONAA >60 05/20/2007 0941   GFRAA  05/21/2007 0550    >60        The eGFR has been calculated using the MDRD equation. This calculation has not been validated in all clinical   GFRAA  05/20/2007 0941    >60        The eGFR has been calculated using the MDRD equation. This calculation has not been validated in all clinical   CMP     Component Value Date/Time   NA 134* 06/09/2013 1228   K 4.6 06/09/2013 1228   CL 103 06/09/2013 1228   CO2 23 06/09/2013 1228   GLUCOSE 98 06/09/2013 1228   BUN 24* 06/09/2013 1228   CREATININE 0.84 06/09/2013 1228   CREATININE 0.91 10/04/2009   CREATININE 0.91 10/04/2009   CALCIUM 9.2 06/09/2013 1228    PROT 7.0 10/04/2009   PROT 7.0 10/04/2009   ALBUMIN 4.1 10/04/2009   ALBUMIN 4.1 10/04/2009   AST 16 10/04/2009   AST 16 10/04/2009   ALT 20 10/04/2009   ALT 20 10/04/2009   ALKPHOS 69 10/04/2009   ALKPHOS 69 10/04/2009   GFRNONAA >60 05/21/2007 0550   GFRAA  05/21/2007 0550    >60        The eGFR has been calculated using the MDRD equation. This calculation has not been validated in all clinical       Component Value Date/Time   WBC 5.8 12/30/2014 0729   WBC 5.2 11/27/2014 0930   WBC 6.7 10/04/2009   WBC 6.7 10/04/2009   HGB 12.2* 12/30/2014 0729   HGB 11.6* 11/27/2014 0930   HGB 12.8 10/04/2009   HGB 4.26 10/04/2009   HCT 36.4* 12/30/2014 0729   HCT 35.2* 11/27/2014 0930   HCT 39.6 10/04/2009   HCT 12.8 10/04/2009   MCV 93.6 12/30/2014 0729   MCV 93.6 11/27/2014 0930   MCV 93.0 10/04/2009   MCV 39.6 10/04/2009    Lipid Panel     Component Value Date/Time   CHOL 112 10/04/2009   CHOL 293 10/04/2009   TRIG 113 10/04/2009   TRIG 113 10/04/2009   HDL 28 10/04/2009   HDL 28 10/04/2009   LDLCALC 61 10/04/2009   LDLCALC 61 10/04/2009    ABG No results found for: PHART, PCO2ART, PO2ART, HCO3, TCO2, ACIDBASEDEF, O2SAT   No results found for: TSH BNP (last 3 results) No results for input(s): BNP in the last 8760 hours.  ProBNP (last 3 results) No results for input(s): PROBNP in the last 8760 hours.  Cardiac Panel (last 3 results) No results for input(s): CKTOTAL, CKMB, TROPONINI, RELINDX in the last 72 hours.  Iron/TIBC/Ferritin/ %Sat No results found for: IRON, TIBC, FERRITIN, IRONPCTSAT   EKG Orders placed or performed in visit on 08/05/14  . EKG 12-Lead     Prior Assessment and Plan Problem List as of 06/11/2015      Cardiovascular and Mediastinum   HYPERTENSION   Last Assessment & Plan 05/06/2013 Office Visit Edited 05/21/2013  1:27 PM by Yehuda Savannah, MD    Blood pressure control has been suboptimal. Spironolactone added to medical regime  with close monitoring of electrolytes and renal function.   Recommendation to the patient that he obtain blood pressures  at home and pay closer attention to adjustment of medications to adequately control hypertension.  No recent laboratory test results available. We will seek records from Dr. Karie Kirks.      Permanent atrial fibrillation   Last Assessment & Plan 05/28/2015 Office Visit Written 05/28/2015  4:06 PM by Erlene Quan, PA-C    .      Peripheral vascular disease   Last Assessment & Plan 01/06/2013 Office Visit Edited 01/08/2013  9:57 AM by Yehuda Savannah, MD    Patient has atherosclerotic disease but no definite symptoms or problems related to that. Consultation with a vascular expert will be deferred. He has new edema, primarily in the right leg. Amlodipine dosage will be reduced markedly and venous ultrasound performed to exclude DVT. Salt restriction and leg elevation will also be emphasized. I will reassess his edema in 3 weeks.      PVD (peripheral vascular disease) with claudication   Last Assessment & Plan 09/30/2014 Office Visit Written 09/30/2014  2:45 PM by Angelia Mould, MD    Based on his exam, he has evidence of infrainguinal arterial occlusive disease on the left side. On the right side however, he has palpable pedal pulses and a normal ABI. Given that his symptoms involving both lower extremities I'm not convinced that his symptoms are related to his peripheral vascular disease. He denies any back pain but his symptoms could be neurogenic in origin. He does have significant neuropathy. Given his infrainguinal arterial occlusive disease on the left, I would only consider arteriography and revascularization if he developed a nonhealing wound or significant rest pain. Fortunately, he is not a smoker. I have encouraged him to stay as active as possible. I'll plan on seeing him back in one year with follow up ABIs. He knows to call sooner if he has problems.         Endocrine   Diabetes mellitus   Last Assessment & Plan 05/28/2015 Office Visit Written 05/28/2015  4:08 PM by Erlene Quan, PA-C    +Peripheral neuropathy        Other   Dyslipidemia   Last Assessment & Plan 05/28/2015 Office Visit Written 05/28/2015  4:09 PM by Erlene Quan, PA-C    Statin intolerant      Hx of CABG   Last Assessment & Plan 05/28/2015 Office Visit Written 05/28/2015  4:08 PM by Erlene Quan, PA-C    CABG in the 1990s; 2002 patent grafts and nl EF; Stress nuc. 2008-mild LV dilatation; normal EF,  no ischemia      Chronic anticoagulation   Last Assessment & Plan 05/28/2015 Office Visit Written 05/28/2015  4:06 PM by Erlene Quan, PA-C    Warfarin      Encounter for therapeutic drug monitoring   Bradycardia       Imaging: No results found.

## 2015-06-11 NOTE — Progress Notes (Signed)
Cardiology Office Note   Date:  06/11/2015   ID:  [name], DOB Sep 20, 1936, MRN [id number]  PCP:  Robert Bellow, MD  Cardiologist: Woodroe Chen, NP   Chief Complaint  Patient presents with  . Bradycardia  . Atrial Fibrillation      History of Present Illness: [name] is a 79 y.o. male who presents with a history of remote CABG in the 54's who has done well since. He has PAF and is asymptomatic. He is on Coumadin. He has had slow rates noted on prior EKGs. Recently his BP machine at home has shown rates in the 30's. He brought in three pages of his readings going back to June that document a gradual trend toward more pronounce bradycardia. On last office visit, bystolic dose was decreased to 5 mg daily in the setting of bradycardia. He as continued on coumadin with CHADS VASC Score of 4.   He is without complaints. Feels better on lower dose of bystolic. No dizziness, dyspnea or near syncope.   Past Medical History  Diagnosis Date  . Hyperlipidemia   . Hypertension   . Arteriosclerotic cardiovascular disease (ASCVD)     CABG in 1990s; 2002 total obstruction of LAD, CX and RCA with patent grafts and nl EF; Stress nuc. 2008 - mild LV dilation; normal EF; questionable small anteroapical scar; no ischemia  . Atrial fibrillation   . Squamous cell carcinoma     Left jaw, resected in 1990s  . Foot ulcer     Left with associated osteomyelitis; left leg cellulitis in 2008;  . Tibia fracture     Left  . Pedal edema   . Chronic anticoagulation   . Diabetes mellitus without complication   . Myocardial infarction   . Peripheral vascular disease   . CAD (coronary artery disease)     Past Surgical History  Procedure Laterality Date  . Coronary artery bypass graft  1990's  . Squamous cell carcinoma excision  1993    Left lower face  . Tonsillectomy    . Orif tibia fracture      left  . Colonoscopy N/A 11/27/2014    Procedure: COLONOSCOPY;  Surgeon:  Rogene Houston, MD;  [facility]: [facility];  Service: Endoscopy;  Laterality: N/A;  830     Current Outpatient Prescriptions  Medication Sig Dispense Refill  . amLODipine (NORVASC) 10 MG tablet Take 1 tablet (10 mg total) by mouth daily. 90 tablet 3  . aspirin 81 MG tablet Take 81 mg by mouth daily.    . cloNIDine (CATAPRES - DOSED IN MG/24 HR) 0.3 mg/24hr patch APPLY 1 PATCH ONCE WEEKLY 12 patch 3  . ferrous sulfate 325 (65 FE) MG tablet Take 65 mg by mouth daily with breakfast.    . fish oil-omega-3 fatty acids 1000 MG capsule Take 1 capsule by mouth daily.      Marland Kitchen JANTOVEN 2.5 MG tablet TAKE 1 TABLET DAILY OR AS  DIRECTED BY COUMADIN CLINIC 90 tablet 3  . losartan-hydrochlorothiazide (HYZAAR) 100-25 MG per tablet Take 1 tablet by mouth daily.      . Multiple Vitamins-Minerals (CENTRUM SILVER PO) Take 1 tablet by mouth daily.      . nebivolol (BYSTOLIC) 5 MG tablet Take 1 tablet (5 mg total) by mouth daily. 90 tablet 3  . Probiotic Product (PHILLIPS COLON HEALTH PO) Take 1 capsule by mouth daily.      No current facility-administered medications for this visit.  Allergies:   Simvastatin; Amoxicillin; Lipitor; and Sulfonamide derivatives    Social History:  The patient  reports that he has never smoked. He has never used smokeless tobacco. He reports that he does not drink alcohol or use illicit drugs.   Family History:  The patient's family history includes Heart disease in his mother and other.    ROS: .   All other systems are reviewed and negative.Unless otherwise mentioned in H&P above.   PHYSICAL EXAM: VS:  BP 130/70 mmHg  Pulse 57  Ht 5' 8.5" (1.74 m)  Wt 190 lb (86.183 kg)  BMI 28.47 kg/m2  SpO2 98% , BMI Body mass index is 28.47 kg/(m^2). GEN: Well nourished, well developed, in no acute distress HEENT: normal Neck: no JVD, carotid bruits, or masses Cardiac: IRRR; bradycardic,  no murmurs, rubs, or gallops,no edema  Respiratory:  clear to auscultation  bilaterally, normal work of breathing GI: soft, nontender, nondistended, + BS MS: no deformity or atrophy Skin: warm and dry, no rash Neuro:  Strength and sensation are intact Psych: euthymic mood, full affect  EKG: Atrial fib with slow ventricular response, HR of 54.   Recent Labs: 12/30/2014: Hemoglobin 12.2*; Platelets 293    Lipid Panel    Component Value Date/Time   CHOL 112 10/04/2009   CHOL 293 10/04/2009   TRIG 113 10/04/2009   TRIG 113 10/04/2009   HDL 28 10/04/2009   HDL 28 10/04/2009   LDLCALC 61 10/04/2009   LDLCALC 61 10/04/2009      Wt Readings from Last 3 Encounters:  06/11/15 190 lb (86.183 kg)  05/28/15 190 lb (86.183 kg)  02/01/15 182 lb 6.4 oz (82.736 kg)     ASSESSMENT AND PLAN:  1. Atrial fib with slow ventricular response: He feels much better on lower dose of by systolic. I will consider decreasing the dose even further to 2.5 mg daily, but he states that he feels fine and does not wish to have another dose of medication. He denies any dizziness, dyspnea on exertion, chest pain, or near syncope. EKG revealed atrial fibrillation with slow ventricular response. He is completely asymptomatic with this.  He will continue in our Coumadin clinic for dosing. He is followed by Christella Noa, RN, and is due to see her next week. He denies any bleeding symptoms, hemoptysis, or tarry stools.  2. Hypertension: BP is well controlled. No changes in medication regimen.He continues on clonidine which can also be contributing bradycardia. Will continue to monitor.No changes at this time.      Current medicines are reviewed at length with the patient today.    Labs/ tests ordered today include: None  Orders Placed This Encounter  Procedures  . EKG 12-Lead     Disposition:   FU with 3 - 4 months.   Signed, Jory Sims, NP  06/11/2015 4:18 PM    [facility] [address] [place], [place] [address] Phone: [phone]; Fax: [phone]

## 2015-06-11 NOTE — Patient Instructions (Signed)
Your physician wants you to follow-up in: 6 months with [name], NP. You will receive a reminder letter in the mail two months in advance. If you don't receive a letter, please call our office to schedule the follow-up appointment.  Your physician recommends that you continue on your current medications as directed. Please refer to the Current Medication list given to you today.  Thank you for choosing [facility]!

## 2015-06-30 ENCOUNTER — Ambulatory Visit (INDEPENDENT_AMBULATORY_CARE_PROVIDER_SITE_OTHER): Payer: Medicare Other | Admitting: *Deleted

## 2015-06-30 DIAGNOSIS — Z5181 Encounter for therapeutic drug level monitoring: Secondary | ICD-10-CM | POA: Diagnosis not present

## 2015-06-30 DIAGNOSIS — I4821 Permanent atrial fibrillation: Secondary | ICD-10-CM

## 2015-06-30 DIAGNOSIS — I4891 Unspecified atrial fibrillation: Secondary | ICD-10-CM

## 2015-06-30 DIAGNOSIS — Z7901 Long term (current) use of anticoagulants: Secondary | ICD-10-CM

## 2015-06-30 DIAGNOSIS — I482 Chronic atrial fibrillation: Secondary | ICD-10-CM

## 2015-06-30 LAB — POCT INR: INR: 3.7

## 2015-07-21 ENCOUNTER — Ambulatory Visit (INDEPENDENT_AMBULATORY_CARE_PROVIDER_SITE_OTHER): Payer: Medicare Other | Admitting: *Deleted

## 2015-07-21 DIAGNOSIS — I4821 Permanent atrial fibrillation: Secondary | ICD-10-CM

## 2015-07-21 DIAGNOSIS — I482 Chronic atrial fibrillation: Secondary | ICD-10-CM

## 2015-07-21 DIAGNOSIS — I4891 Unspecified atrial fibrillation: Secondary | ICD-10-CM

## 2015-07-21 DIAGNOSIS — Z5181 Encounter for therapeutic drug level monitoring: Secondary | ICD-10-CM | POA: Diagnosis not present

## 2015-07-21 DIAGNOSIS — Z7901 Long term (current) use of anticoagulants: Secondary | ICD-10-CM | POA: Diagnosis not present

## 2015-07-21 LAB — POCT INR: INR: 2.6

## 2015-07-24 DIAGNOSIS — Z969 Presence of functional implant, unspecified: Secondary | ICD-10-CM

## 2015-07-24 HISTORY — DX: Presence of functional implant, unspecified: Z96.9

## 2015-07-26 ENCOUNTER — Telehealth: Payer: Self-pay | Admitting: Cardiovascular Disease

## 2015-07-26 NOTE — Telephone Encounter (Signed)
The office of Dr. Laurena Slimmer faxed over a surgical clearance paper work for this pt on 07/07/15 and they have not received anything back yet. pls call Mateo Flow at [phone]

## 2015-07-26 NOTE — Telephone Encounter (Signed)
Dr Sabra Heck office to re-fax note

## 2015-07-27 ENCOUNTER — Encounter (HOSPITAL_COMMUNITY)
Admission: RE | Admit: 2015-07-27 | Discharge: 2015-07-27 | Disposition: A | Discharge status: Home / Self Care | Payer: Medicare Other | Source: Ambulatory Visit | Attending: Oral Surgery | Admitting: Oral Surgery

## 2015-07-27 ENCOUNTER — Telehealth: Payer: Self-pay | Admitting: *Deleted

## 2015-07-27 ENCOUNTER — Encounter (HOSPITAL_BASED_OUTPATIENT_CLINIC_OR_DEPARTMENT_OTHER): Payer: Self-pay | Admitting: *Deleted

## 2015-07-27 DIAGNOSIS — Z01818 Encounter for other preprocedural examination: Secondary | ICD-10-CM | POA: Diagnosis present

## 2015-07-27 DIAGNOSIS — R0989 Other specified symptoms and signs involving the circulatory and respiratory systems: Secondary | ICD-10-CM

## 2015-07-27 HISTORY — DX: Other specified symptoms and signs involving the circulatory and respiratory systems: R09.89

## 2015-07-27 LAB — BASIC METABOLIC PANEL
Anion gap: 9 (ref 5–15)
BUN: 33 mg/dL — AB (ref 6–20)
CHLORIDE: 111 mmol/L (ref 101–111)
CO2: 21 mmol/L — AB (ref 22–32)
Calcium: 7.8 mg/dL — ABNORMAL LOW (ref 8.9–10.3)
Creatinine, Ser: 1.04 mg/dL (ref 0.61–1.24)
GFR calc Af Amer: >60 mL/min (ref >60)
GFR calc non Af Amer: >60 mL/min (ref >60)
GLUCOSE: 106 mg/dL — AB (ref 65–99)
POTASSIUM: 3.8 mmol/L (ref 3.5–5.1)
Sodium: 141 mmol/L (ref 135–145)

## 2015-07-27 NOTE — Telephone Encounter (Signed)
-----   Message from Herminio Commons, MD sent at 07/27/2015 10:18 AM EDT ----- Regarding: FW: when to hold Evonnie Pat,  Can you assist? Thanks.  Jamesetta So  ----- Message -----    From: Bernita Raisin, RN    Sent: 07/27/2015  10:06 AM      To: Herminio Commons, MD Subject: when to hold jantoven                          [facility] wants to know what to do regarding blood thinner as pt is having hardware removed from jaw

## 2015-07-27 NOTE — Telephone Encounter (Signed)
Scheduled for oral surgery on 08/02/15.  Pt told to take last dose of coumadin on 07/29/15.  He can restart coumadin at his regular dose the night of procedure if OK with surgeon.  F/U INR check 08/11/15.  Pt verbalized understanding.

## 2015-07-28 NOTE — Progress Notes (Signed)
Dr crews cleared lab work

## 2015-07-29 NOTE — H&P (Signed)
  This is a 79 y/o wd/wn w male who under went surgical removal of a cancer approximately 20 years ago.   A bone graft was placed to replace the resected mandible. The screws were used to stabilize the mandible.  The treatment plan is to remove the screws extraorally.  If the screws fracture we will leave the screw in place.

## 2015-07-29 NOTE — H&P (Signed)
[name] is an 79 y.o. male.   Chief Complaint: painful screws HPI: placed 20 years ago for a hemimandiblectomy  Past Medical History  Diagnosis Date  . Arteriosclerotic cardiovascular disease (ASCVD)     CABG in 1990s; 2002 total obstruction of LAD, CX and RCA with patent grafts and nl EF; Stress nuc. 2008 - mild LV dilation; normal EF; questionable small anteroapical scar; no ischemia  . Atrial fibrillation ([place])   . Chronic anticoagulation   . Arthritis     knees  . Pedal edema     "not a lot", per pt.  . Neuropathy due to type 2 diabetes mellitus ([place])     bilateral lower leg, hands  . Hypertension     states under control with meds., has been on med. x "long time"  . Diet-controlled diabetes mellitus ([place])   . History of squamous cell carcinoma 1990s    left jaw  . Peripheral vascular disease ([place])   . 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 Cataracts, bilateral   . Dental crowns present   . Runny nose 07/27/2015    clear drainage, per pt.    Past Surgical History  Procedure Laterality Date  . Coronary artery bypass graft  1990's  . Squamous cell carcinoma excision Left 1993    jaw  . Tonsillectomy    . Orif tibia fracture Left   . Colonoscopy N/A 11/27/2014    Procedure: COLONOSCOPY;  Surgeon: Rogene Houston, MD;  [facility];  Service: Endoscopy;  Laterality: N/A;  830  . Incision and drainage abscess Left 06/16/2005    wide exc. abscess 5th toe  . Cardiac catheterization  04/16/2001    Family History  Problem Relation Age of Onset  . Heart disease Mother    Social History:  reports that he has never smoked. He has never used smokeless tobacco. He reports that he does not drink alcohol or use illicit drugs.  Allergies:  Allergies  Allergen Reactions  . Lipitor [Atorvastatin] Other (See Comments)    SEVERE HEADACHE  . Simvastatin Other (See Comments)    MUSCLE ACHES  . Penicillins Rash  . Sulfa Antibiotics Rash     No prescriptions prior to admission    Results for orders placed or performed during the hospital encounter of 07/27/15 (from the past 48 hour(s))  Basic metabolic panel     Status: Abnormal   Collection Time: 07/27/15 11:33 AM  Result Value Ref Range   Sodium 141 135 - 145 mmol/L   Potassium 3.8 3.5 - 5.1 mmol/L   Chloride 111 101 - 111 mmol/L   CO2 21 (L) 22 - 32 mmol/L   Glucose, Bld 106 (H) 65 - 99 mg/dL   BUN 33 (H) 6 - 20 mg/dL   Creatinine, Ser 1.04 0.61 - 1.24 mg/dL   Calcium 7.8 (L) 8.9 - 10.3 mg/dL   GFR calc non Af Amer >60 >60 mL/min   GFR calc Af Amer >60 >60 mL/min    Comment: (NOTE) The eGFR has been calculated using the CKD EPI equation. This calculation has not been validated in all clinical situations. eGFR's persistently <60 mL/min signify possible Chronic Kidney Disease.    Anion gap 9 5 - 15   No results found.  Review of Systems  Constitutional: Negative.   HENT: Negative.   Eyes: Negative.   Respiratory: Negative.   Cardiovascular: Negative.   Gastrointestinal: Negative.   Genitourinary: Negative.  Musculoskeletal: Negative.   Skin: Negative.   Neurological: Negative.   Endo/Heme/Allergies: Negative.   Psychiatric/Behavioral: Negative.     Height 5' 8.5" (1.74 m), weight 83.915 kg (185 lb). Physical Exam  HENT:  Head:       Assessment/Plan Two tender protruding screws under the skin right mandible  ,JOSEPH L 07/29/2015, 10:39 AM    

## 2015-08-02 ENCOUNTER — Ambulatory Visit (HOSPITAL_BASED_OUTPATIENT_CLINIC_OR_DEPARTMENT_OTHER): Payer: Medicare Other | Admitting: Anesthesiology

## 2015-08-02 ENCOUNTER — Ambulatory Visit (HOSPITAL_BASED_OUTPATIENT_CLINIC_OR_DEPARTMENT_OTHER)
Admission: RE | Admit: 2015-08-02 | Discharge: 2015-08-02 | Disposition: A | Discharge status: Home / Self Care | Payer: Medicare Other | Source: Ambulatory Visit | Attending: Oral Surgery | Admitting: Oral Surgery

## 2015-08-02 ENCOUNTER — Encounter (HOSPITAL_BASED_OUTPATIENT_CLINIC_OR_DEPARTMENT_OTHER)
Admission: RE | Disposition: A | Discharge status: Home / Self Care | Payer: Self-pay | Source: Ambulatory Visit | Attending: Oral Surgery

## 2015-08-02 ENCOUNTER — Encounter (HOSPITAL_BASED_OUTPATIENT_CLINIC_OR_DEPARTMENT_OTHER): Payer: Self-pay

## 2015-08-02 DIAGNOSIS — T8484XA Pain due to internal orthopedic prosthetic devices, implants and grafts, initial encounter: Secondary | ICD-10-CM | POA: Insufficient documentation

## 2015-08-02 DIAGNOSIS — Z951 Presence of aortocoronary bypass graft: Secondary | ICD-10-CM | POA: Insufficient documentation

## 2015-08-02 DIAGNOSIS — E1140 Type 2 diabetes mellitus with diabetic neuropathy, unspecified: Secondary | ICD-10-CM | POA: Diagnosis not present

## 2015-08-02 DIAGNOSIS — Y838 Other surgical procedures as the cause of abnormal reaction of the patient, or of later complication, without mention of misadventure at the time of the procedure: Secondary | ICD-10-CM | POA: Diagnosis not present

## 2015-08-02 DIAGNOSIS — I10 Essential (primary) hypertension: Secondary | ICD-10-CM | POA: Diagnosis not present

## 2015-08-02 DIAGNOSIS — Z880 Allergy status to penicillin: Secondary | ICD-10-CM | POA: Diagnosis not present

## 2015-08-02 HISTORY — PX: MANDIBULAR HARDWARE REMOVAL: SHX5205

## 2015-08-02 HISTORY — DX: Presence of functional implant, unspecified: Z96.9

## 2015-08-02 HISTORY — DX: Unspecified osteoarthritis, unspecified site: M19.90

## 2015-08-02 HISTORY — DX: Other specified symptoms and signs involving the circulatory and respiratory systems: R09.89

## 2015-08-02 HISTORY — DX: Dental restoration status: Z98.811

## 2015-08-02 LAB — POCT HEMOGLOBIN-HEMACUE: Hemoglobin: 13.5 g/dL (ref 13.0–17.0)

## 2015-08-02 LAB — GLUCOSE, CAPILLARY
Glucose-Capillary: 103 mg/dL — ABNORMAL HIGH (ref 65–99)
Glucose-Capillary: 112 mg/dL — ABNORMAL HIGH (ref 65–99)

## 2015-08-02 SURGERY — REMOVAL, HARDWARE, MANDIBLE
Anesthesia: General | Site: Face | Laterality: Left

## 2015-08-02 MED ORDER — LIDOCAINE HCL (CARDIAC) 20 MG/ML IV SOLN
INTRAVENOUS | Status: DC | PRN
  Administered 2015-08-02: 50 mg via INTRAVENOUS

## 2015-08-02 MED ORDER — EPHEDRINE SULFATE 50 MG/ML IJ SOLN
INTRAMUSCULAR | Status: AC
  Filled 2015-08-02: qty 1

## 2015-08-02 MED ORDER — PROPOFOL 10 MG/ML IV BOLUS
INTRAVENOUS | Status: DC | PRN
Start: 2015-08-02 — End: 2015-08-02
  Administered 2015-08-02: 150 mg via INTRAVENOUS

## 2015-08-02 MED ORDER — LACTATED RINGERS IV SOLN
INTRAVENOUS | Status: DC
  Administered 2015-08-02 (×2): via INTRAVENOUS

## 2015-08-02 MED ORDER — BACITRACIN 500 UNIT/GM EX OINT
TOPICAL_OINTMENT | CUTANEOUS | Status: DC | PRN
  Administered 2015-08-02: 1 via TOPICAL

## 2015-08-02 MED ORDER — DEXAMETHASONE SODIUM PHOSPHATE 4 MG/ML IJ SOLN
INTRAMUSCULAR | Status: DC | PRN
  Administered 2015-08-02: 10 mg via INTRAVENOUS

## 2015-08-02 MED ORDER — LIDOCAINE HCL (CARDIAC) 20 MG/ML IV SOLN
INTRAVENOUS | Status: AC
  Filled 2015-08-02: qty 5

## 2015-08-02 MED ORDER — FENTANYL CITRATE (PF) 100 MCG/2ML IJ SOLN
50.0000 ug | INTRAMUSCULAR | Status: DC | PRN
  Administered 2015-08-02: 100 ug via INTRAVENOUS

## 2015-08-02 MED ORDER — LIDOCAINE-EPINEPHRINE 1 %-1:100000 IJ SOLN
INTRAMUSCULAR | Status: DC | PRN
  Administered 2015-08-02: .5 mL

## 2015-08-02 MED ORDER — 0.9 % SODIUM CHLORIDE (POUR BTL) OPTIME
TOPICAL | Status: DC | PRN
  Administered 2015-08-02: 100 mL

## 2015-08-02 MED ORDER — SUCCINYLCHOLINE CHLORIDE 20 MG/ML IJ SOLN
INTRAMUSCULAR | Status: AC
  Filled 2015-08-02: qty 1

## 2015-08-02 MED ORDER — EPHEDRINE SULFATE 50 MG/ML IJ SOLN
INTRAMUSCULAR | Status: DC | PRN
  Administered 2015-08-02 (×2): 10 mg via INTRAVENOUS

## 2015-08-02 MED ORDER — PROPOFOL 10 MG/ML IV BOLUS
INTRAVENOUS | Status: AC
  Filled 2015-08-02: qty 20

## 2015-08-02 MED ORDER — SCOPOLAMINE 1 MG/3DAYS TD PT72: 1.0000 | MEDICATED_PATCH | Freq: Once | TRANSDERMAL | Status: DC | PRN

## 2015-08-02 MED ORDER — LIDOCAINE-EPINEPHRINE 1 %-1:100000 IJ SOLN
INTRAMUSCULAR | Status: AC
  Filled 2015-08-02: qty 1

## 2015-08-02 MED ORDER — SUCCINYLCHOLINE CHLORIDE 20 MG/ML IJ SOLN
INTRAMUSCULAR | Status: DC | PRN
  Administered 2015-08-02: 100 mg via INTRAVENOUS

## 2015-08-02 MED ORDER — PROPOFOL 500 MG/50ML IV EMUL
INTRAVENOUS | Status: AC
  Filled 2015-08-02: qty 50

## 2015-08-02 MED ORDER — GLYCOPYRROLATE 0.2 MG/ML IJ SOLN: 0.2000 mg | Freq: Once | INTRAMUSCULAR | Status: DC | PRN

## 2015-08-02 MED ORDER — ATROPINE SULFATE 0.4 MG/ML IJ SOLN
INTRAMUSCULAR | Status: AC
  Filled 2015-08-02: qty 1

## 2015-08-02 MED ORDER — MIDAZOLAM HCL 2 MG/2ML IJ SOLN
INTRAMUSCULAR | Status: AC
  Filled 2015-08-02: qty 2

## 2015-08-02 MED ORDER — ONDANSETRON HCL 4 MG/2ML IJ SOLN
INTRAMUSCULAR | Status: AC
  Filled 2015-08-02: qty 2

## 2015-08-02 MED ORDER — DEXAMETHASONE SODIUM PHOSPHATE 10 MG/ML IJ SOLN
INTRAMUSCULAR | Status: AC
  Filled 2015-08-02: qty 1

## 2015-08-02 MED ORDER — ONDANSETRON HCL 4 MG/2ML IJ SOLN
INTRAMUSCULAR | Status: DC | PRN
  Administered 2015-08-02: 4 mg via INTRAVENOUS

## 2015-08-02 MED ORDER — MIDAZOLAM HCL 2 MG/2ML IJ SOLN
1.0000 mg | INTRAMUSCULAR | Status: DC | PRN
  Administered 2015-08-02: 1 mg via INTRAVENOUS

## 2015-08-02 MED ORDER — CEPHALEXIN 500 MG PO CAPS: 500.0000 mg | ORAL_CAPSULE | Freq: Four times a day (QID) | ORAL | Status: DC

## 2015-08-02 MED ORDER — HYDROCODONE-ACETAMINOPHEN 5-325 MG PO TABS: 1.0000 | ORAL_TABLET | Freq: Four times a day (QID) | ORAL | Status: DC | PRN

## 2015-08-02 MED ORDER — CEFAZOLIN SODIUM-DEXTROSE 2-3 GM-% IV SOLR
2.0000 g | INTRAVENOUS | Status: AC
  Administered 2015-08-02: 2 g via INTRAVENOUS

## 2015-08-02 MED ORDER — FENTANYL CITRATE (PF) 100 MCG/2ML IJ SOLN
INTRAMUSCULAR | Status: AC
  Filled 2015-08-02: qty 2

## 2015-08-02 MED ORDER — BACITRACIN ZINC 500 UNIT/GM EX OINT
TOPICAL_OINTMENT | CUTANEOUS | Status: AC
  Filled 2015-08-02: qty 0.9

## 2015-08-02 MED ORDER — CEFAZOLIN SODIUM-DEXTROSE 2-3 GM-% IV SOLR
INTRAVENOUS | Status: AC
  Filled 2015-08-02: qty 50

## 2015-08-02 SURGICAL SUPPLY — 43 items
BLADE SURG 15 STRL LF DISP TIS (BLADE) ×1 IMPLANT
BLADE SURG 15 STRL SS (BLADE) ×6
CANISTER SUCT 1200ML W/VALVE (MISCELLANEOUS) ×3 IMPLANT
COVER MAYO STAND STRL (DRAPES) ×3 IMPLANT
DECANTER SPIKE VIAL GLASS SM (MISCELLANEOUS) ×3 IMPLANT
DRAPE U-SHAPE 76X120 STRL (DRAPES) ×2 IMPLANT
DRSG TELFA 3X8 NADH (GAUZE/BANDAGES/DRESSINGS) ×3 IMPLANT
ELECT COATED BLADE 2.86 ST (ELECTRODE) IMPLANT
ELECT NDL BLADE 2-5/6 (NEEDLE) IMPLANT
ELECT NEEDLE BLADE 2-5/6 (NEEDLE) ×3 IMPLANT
ELECT REM PT RETURN 9FT ADLT (ELECTROSURGICAL) ×3
ELECTRODE REM PT RTRN 9FT ADLT (ELECTROSURGICAL) IMPLANT
GAUZE SPONGE 4X4 16PLY XRAY LF (GAUZE/BANDAGES/DRESSINGS) IMPLANT
GLOVE BIO SURGEON STRL SZ 6.5 (GLOVE) ×3 IMPLANT
GLOVE BIO SURGEON STRL SZ7.5 (GLOVE) ×3 IMPLANT
GLOVE BIO SURGEONS STRL SZ 6.5 (GLOVE) ×2
GLOVE EXAM NITRILE MD LF STRL (GLOVE) ×2 IMPLANT
GOWN STRL REUS W/ TWL LRG LVL3 (GOWN DISPOSABLE) ×1 IMPLANT
GOWN STRL REUS W/TWL LRG LVL3 (GOWN DISPOSABLE) ×6
MARKER SKIN DUAL TIP RULER LAB (MISCELLANEOUS) IMPLANT
NDL BLUNT 17GA (NEEDLE) IMPLANT
NDL PRECISIONGLIDE 27X1.5 (NEEDLE) ×1 IMPLANT
NEEDLE BLUNT 17GA (NEEDLE) ×3 IMPLANT
NEEDLE PRECISIONGLIDE 27X1.5 (NEEDLE) ×3 IMPLANT
NS IRRIG 1000ML POUR BTL (IV SOLUTION) ×2 IMPLANT
PACK BASIN DAY SURGERY FS (CUSTOM PROCEDURE TRAY) ×3 IMPLANT
PAD DRESSING TELFA 3X8 NADH (GAUZE/BANDAGES/DRESSINGS) IMPLANT
PENCIL BUTTON HOLSTER BLD 10FT (ELECTRODE) ×2 IMPLANT
PENCIL FOOT CONTROL (ELECTRODE) IMPLANT
SCISSORS WIRE ANG 4 3/4 DISP (INSTRUMENTS) IMPLANT
SHEET MEDIUM DRAPE 40X70 STRL (DRAPES) ×3 IMPLANT
SLEEVE SCD COMPRESS KNEE MED (MISCELLANEOUS) ×2 IMPLANT
SUT CHROMIC 3 0 PS 2 (SUTURE) IMPLANT
SUT CHROMIC 4 0 PS 2 18 (SUTURE) IMPLANT
SUT ETHILON 5 0 PS 2 18 (SUTURE) ×2 IMPLANT
SUT VICRYL 4-0 PS2 18IN ABS (SUTURE) ×2 IMPLANT
SYR 50ML LL SCALE MARK (SYRINGE) ×2 IMPLANT
SYR CONTROL 10ML LL (SYRINGE) ×3 IMPLANT
TOWEL OR 17X24 6PK STRL BLUE (TOWEL DISPOSABLE) ×6 IMPLANT
TRAY DSU PREP LF (CUSTOM PROCEDURE TRAY) ×2 IMPLANT
TUBE CONNECTING 20'X1/4 (TUBING) ×1
TUBE CONNECTING 20X1/4 (TUBING) ×2 IMPLANT
YANKAUER SUCT BULB TIP NO VENT (SUCTIONS) ×3 IMPLANT

## 2015-08-02 NOTE — Op Note (Signed)
The patient was brought to the operating room and placed in a supine position and which she remained throughout the whole procedure. He was intubated via an oral endotracheal tube and prepped and draped in usual fashion for an extraoral procedure.the face was prepared with Betadine scrub and paint. The area was then draped off. 0.59m of1% Xylocaine with 1-100,000 epinephrine was infiltrated in the skin around the protruding screws. A #15 blade then made an incision in the skin and just into the connective tissue below the skin. A fresh blade was then used to carry the incision down to the head of the screws which could easily be palpated. A periosteal elevator reflected soft tissue around each of the screw heads. We were not able to locate the appropriate screwdriver that would fit the head of the screw. A large hemostat was used to begin backing the screws out. As predicted both screws severed off. A round bur and copious irrigation was used to smooth off the bony bump that was protruding around the screw heads. The screws were then smoothed off at or below the level of the bone. The area was irrigated. Bleeding points were coagulated with an electrosurgical. The periosteum was closed with multiple 4-0 Vicryls sutures. The skin was then closed with multiple 5-0 nylon sutures. The patient tolerated the procedure well. The area was covered with bacitracin and Telfa along with a pressure dressing. The patient was extubated on the table and will be followed by me in my private office.

## 2015-08-02 NOTE — Transfer of Care (Signed)
Immediate Anesthesia Transfer of Care Note  Patient: [name]  Procedure(s) Performed: Procedure(s):  HARDWARE REMOVAL TWO MANDIBULAR SCREWS (Left)  Patient Location: PACU  Anesthesia Type:General  Level of Consciousness: awake, alert  and oriented  Airway & Oxygen Therapy: Patient Spontanous Breathing and Patient connected to face mask oxygen  Post-op Assessment: Report given to RN and Post -op Vital signs reviewed and stable  Post vital signs: Reviewed and stable  Last Vitals:  Filed Vitals:   08/02/15 0842  BP: 176/62  Pulse: 58  Temp: 36.8 C  Resp: 20    Complications: No apparent anesthesia complications

## 2015-08-02 NOTE — Anesthesia Preprocedure Evaluation (Signed)
Anesthesia Evaluation  Patient identified by MRN, date of birth, ID band Patient awake    Reviewed: Allergy & Precautions, NPO status , Patient's Chart, lab work & pertinent test results, reviewed documented beta blocker date and time   Airway Mallampati: II  TM Distance: >3 FB Neck ROM: Full    Dental no notable dental hx.    Pulmonary neg pulmonary ROS,    Pulmonary exam normal breath sounds clear to auscultation       Cardiovascular hypertension, Pt. on medications and Pt. on home beta blockers + CABG and + Peripheral Vascular Disease  Normal cardiovascular exam Rhythm:Regular Rate:Normal     Neuro/Psych negative neurological ROS  negative psych ROS   GI/Hepatic negative GI ROS, Neg liver ROS,   Endo/Other  diabetes, Type 2  Renal/GU negative Renal ROS     Musculoskeletal  (+) Arthritis ,   Abdominal   Peds  Hematology negative hematology ROS (+)   Anesthesia Other Findings   Reproductive/Obstetrics negative OB ROS                             Anesthesia Physical Anesthesia Plan  ASA: III  Anesthesia Plan: General   Post-op Pain Management:    Induction: Intravenous  Airway Management Planned: Oral ETT  Additional Equipment:   Intra-op Plan:   Post-operative Plan: Extubation in OR  Informed Consent: I have reviewed the patients History and Physical, chart, labs and discussed the procedure including the risks, benefits and alternatives for the proposed anesthesia with the patient or authorized representative who has indicated his/her understanding and acceptance.   Dental advisory given  Plan Discussed with: CRNA  Anesthesia Plan Comments:         Anesthesia Quick Evaluation

## 2015-08-02 NOTE — Anesthesia Postprocedure Evaluation (Signed)
Anesthesia Post Note  Patient: [name]  Procedure(s) Performed: Procedure(s) (LRB):  HARDWARE REMOVAL TWO MANDIBULAR SCREWS (Left)  Anesthesia type: General  Patient location: PACU  Post pain: Pain level controlled  Post assessment: Post-op Vital signs reviewed  Last Vitals: BP 137/62 mmHg  Pulse 67  Temp(Src) 36.4 C (Oral)  Resp 16  Ht 5' 8.5" (1.74 m)  Wt 192 lb (87.091 kg)  BMI 28.77 kg/m2  SpO2 98%  Post vital signs: Reviewed  Level of consciousness: sedated  Complications: No apparent anesthesia complications

## 2015-08-02 NOTE — Anesthesia Procedure Notes (Signed)
Procedure Name: Intubation Date/Time: 08/02/2015 10:34 AM Performed by: Melynda Ripple D Pre-anesthesia Checklist: Patient identified, Emergency Drugs available, Suction available and Patient being monitored Patient Re-evaluated:Patient Re-evaluated prior to inductionOxygen Delivery Method: Circle System Utilized Preoxygenation: Pre-oxygenation with 100% oxygen Intubation Type: IV induction Ventilation: Mask ventilation without difficulty Laryngoscope Size: Mac and 3 Grade View: Grade I Tube type: Oral Tube size: 7.0 mm Number of attempts: 1 Airway Equipment and Method: Stylet and Oral airway Placement Confirmation: ETT inserted through vocal cords under direct vision,  positive ETCO2 and breath sounds checked- equal and bilateral Secured at: 23 cm Tube secured with: Tape Dental Injury: Teeth and Oropharynx as per pre-operative assessment

## 2015-08-02 NOTE — H&P (Signed)
  The surgery was reviewed with the patient.  There is no change in the medical history.

## 2015-08-02 NOTE — Discharge Instructions (Signed)
°  Post Anesthesia Home Care Instructions  Activity: Get plenty of rest for the remainder of the day. A responsible adult should stay with you for 24 hours following the procedure.  For the next 24 hours, DO NOT: -Drive a car -Paediatric nurse -Drink alcoholic beverages -Take any medication unless instructed by your physician -Make any legal decisions or sign important papers.  Meals: Start with liquid foods such as gelatin or soup. Progress to regular foods as tolerated. Avoid greasy, spicy, heavy foods. If nausea and/or vomiting occur, drink only clear liquids until the nausea and/or vomiting subsides. Call your physician if vomiting continues.  Special Instructions/Symptoms: Your throat may feel dry or sore from the anesthesia or the breathing tube placed in your throat during surgery. If this causes discomfort, gargle with warm salt water. The discomfort should disappear within 24 hours.  If you had a scopolamine patch placed behind your ear for the management of post- operative nausea and/or vomiting:  1. The medication in the patch is effective for 72 hours, after which it should be removed.  Wrap patch in a tissue and discard in the trash. Wash hands thoroughly with soap and water. 2. You may remove the patch earlier than 72 hours if you experience unpleasant side effects which may include dry mouth, dizziness or visual disturbances. 3. Avoid touching the patch. Wash your hands with soap and water after contact with the patch.   Call your surgeon if you experience:   1.  Fever over 101.0. 2.  Inability to urinate. 3.  Nausea and/or vomiting. 4.  Extreme swelling or bruising at the surgical site. 5.  Continued bleeding from the incision. 6.  Increased pain, redness or drainage from the incision. 7.  Problems related to your pain medication. 8. Any change in color, movement and/or sensation 9. Any problems and/or concerns   Return to Dr. Ammie Ferrier office for followup  appointment on Monday August 09, 2015  May change dressing as needed and apply neosporin ointment to incision.

## 2015-08-02 NOTE — Brief Op Note (Signed)
08/02/2015  11:20 AM  PATIENT:  [name]  79 y.o. male  PRE-OPERATIVE DIAGNOSIS:  HARDWARE FAILURE, PREVIOUS CANCER OF MANDIBLE  POST-OPERATIVE DIAGNOSIS:  HARDWARE FAILURE, PREVIOUS CANCER OF MANDIBLE  PROCEDURE:  Procedure(s):  HARDWARE REMOVAL TWO MANDIBULAR SCREWS (Left)  SURGEON:  Surgeon(s) and Role:    * Jannette Fogo, DDS - Primary  PHYSICIAN ASSISTANT:   ASSISTANTS: Amedeo Plenty   ANESTHESIA:   general  EBL:  Total I/O In: 1300 [I.V.:1300] Out: -   BLOOD ADMINISTERED:none  DRAINS: none   LOCAL MEDICATIONS USED:  XYLOCAINE   SPECIMEN:  No Specimen  DISPOSITION OF SPECIMEN:  N/A  COUNTS:  YES  TOURNIQUET:  * No tourniquets in log *  DICTATION: .Dragon Dictation  PLAN OF CARE: Discharge to home after PACU  PATIENT DISPOSITION:  PACU - hemodynamically stable.   Delay start of Pharmacological VTE agent (>24hrs) due to surgical blood loss or risk of bleeding: no

## 2015-08-03 ENCOUNTER — Encounter (HOSPITAL_BASED_OUTPATIENT_CLINIC_OR_DEPARTMENT_OTHER): Payer: Self-pay | Admitting: Oral Surgery

## 2015-08-18 ENCOUNTER — Ambulatory Visit (INDEPENDENT_AMBULATORY_CARE_PROVIDER_SITE_OTHER): Payer: Medicare Other | Admitting: *Deleted

## 2015-08-18 DIAGNOSIS — Z5181 Encounter for therapeutic drug level monitoring: Secondary | ICD-10-CM | POA: Diagnosis not present

## 2015-08-18 DIAGNOSIS — Z7901 Long term (current) use of anticoagulants: Secondary | ICD-10-CM | POA: Diagnosis not present

## 2015-08-18 DIAGNOSIS — I4891 Unspecified atrial fibrillation: Secondary | ICD-10-CM | POA: Diagnosis not present

## 2015-08-18 DIAGNOSIS — I4821 Permanent atrial fibrillation: Secondary | ICD-10-CM

## 2015-08-18 DIAGNOSIS — I482 Chronic atrial fibrillation: Secondary | ICD-10-CM

## 2015-08-18 LAB — POCT INR: INR: 2.5

## 2015-09-15 ENCOUNTER — Ambulatory Visit (INDEPENDENT_AMBULATORY_CARE_PROVIDER_SITE_OTHER): Payer: Medicare Other | Admitting: *Deleted

## 2015-09-15 DIAGNOSIS — I4821 Permanent atrial fibrillation: Secondary | ICD-10-CM

## 2015-09-15 DIAGNOSIS — Z5181 Encounter for therapeutic drug level monitoring: Secondary | ICD-10-CM | POA: Diagnosis not present

## 2015-09-15 DIAGNOSIS — Z7901 Long term (current) use of anticoagulants: Secondary | ICD-10-CM

## 2015-09-15 DIAGNOSIS — I482 Chronic atrial fibrillation: Secondary | ICD-10-CM

## 2015-09-15 DIAGNOSIS — I4891 Unspecified atrial fibrillation: Secondary | ICD-10-CM

## 2015-09-15 LAB — POCT INR: INR: 2.7

## 2015-09-23 HISTORY — PX: CATARACT EXTRACTION W/ INTRAOCULAR LENS  IMPLANT, BILATERAL: SHX1307

## 2015-09-30 ENCOUNTER — Encounter: Payer: Self-pay | Admitting: Vascular Surgery

## 2015-10-06 ENCOUNTER — Ambulatory Visit (INDEPENDENT_AMBULATORY_CARE_PROVIDER_SITE_OTHER): Payer: Medicare Other | Admitting: Vascular Surgery

## 2015-10-06 ENCOUNTER — Encounter: Payer: Self-pay | Admitting: Vascular Surgery

## 2015-10-06 ENCOUNTER — Encounter (HOSPITAL_COMMUNITY): Payer: Medicare Other

## 2015-10-06 ENCOUNTER — Ambulatory Visit (HOSPITAL_COMMUNITY)
Admission: RE | Admit: 2015-10-06 | Discharge: 2015-10-06 | Disposition: A | Discharge status: Home / Self Care | Payer: Medicare Other | Source: Ambulatory Visit | Attending: Vascular Surgery | Admitting: Vascular Surgery

## 2015-10-06 ENCOUNTER — Ambulatory Visit: Payer: Medicare Other | Admitting: Vascular Surgery

## 2015-10-06 VITALS — BP 130/62 | HR 55 | Temp 98.2°F | Ht 68.5 in | Wt 191.0 lb

## 2015-10-06 DIAGNOSIS — I739 Peripheral vascular disease, unspecified: Secondary | ICD-10-CM

## 2015-10-06 DIAGNOSIS — E1140 Type 2 diabetes mellitus with diabetic neuropathy, unspecified: Secondary | ICD-10-CM | POA: Diagnosis not present

## 2015-10-06 DIAGNOSIS — I10 Essential (primary) hypertension: Secondary | ICD-10-CM | POA: Diagnosis not present

## 2015-10-06 DIAGNOSIS — I70213 Atherosclerosis of native arteries of extremities with intermittent claudication, bilateral legs: Secondary | ICD-10-CM

## 2015-10-06 NOTE — Progress Notes (Signed)
[facility] [place]  Patient name: [name] MRN: [id number] DOB: January 30, 1936 Sex: male  REASON FOR VISIT: Follow up of peripheral vascular disease.  HPI: [name] is a 79 y.o. male who I last saw on 09/30/2014. At that time, ABI on the left was 92%.  ABI on the right was 100%. Based on his exam he had evidence of infrainguinal arterial occlusive disease on the left. I felt that some of his symptoms could be attributed to his peripheral vascular disease but that he also had some neuropathy. He comes in for a 1 year follow up visit.  Today, he denies any significant claudication or rest pain. He does describe some paresthesias in the feet consistent with neuropathy. He denies any history of nonhealing ulcers. He did have a callus debrided on his left foot but this is healing adequately.  He is not a smoker.  Past Medical History  Diagnosis Date  . Arteriosclerotic cardiovascular disease (ASCVD)     CABG in 1990s; 2002 total obstruction of LAD, CX and RCA with patent grafts and nl EF; Stress nuc. 2008 - mild LV dilation; normal EF; questionable small anteroapical scar; no ischemia  . Atrial fibrillation ([place])   . Chronic anticoagulation   . Arthritis     knees  . Pedal edema     "not a lot", per pt.  . Neuropathy due to type 2 diabetes mellitus ([place])     bilateral lower leg, hands  . Hypertension     states under control with meds., has been on med. x "long time"  . Diet-controlled diabetes mellitus ([place])   . History of squamous cell carcinoma 1990s    left jaw  . Peripheral vascular disease ([place])   . 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 Cataracts, bilateral   . Dental crowns present   . Runny nose 07/27/2015    clear drainage, per pt.    Family History  Problem Relation Age of Onset  . Heart disease Mother     SOCIAL HISTORY: Social History  Substance Use Topics  . Smoking status: Never Smoker   . Smokeless  tobacco: Never Used  . Alcohol Use: No    Allergies  Allergen Reactions  . Lipitor [Atorvastatin] Other (See Comments)    SEVERE HEADACHE  . Simvastatin Other (See Comments)    MUSCLE ACHES  . Penicillins Rash  . Sulfa Antibiotics Rash    Current Outpatient Prescriptions  Medication Sig Dispense Refill  . amLODipine (NORVASC) 10 MG tablet Take 1 tablet (10 mg total) by mouth daily. 90 tablet 3  . aspirin 81 MG tablet Take 81 mg by mouth daily.    . cloNIDine (CATAPRES - DOSED IN MG/24 HR) 0.3 mg/24hr patch APPLY 1 PATCH ONCE WEEKLY 12 patch 3  . ferrous sulfate 325 (65 FE) MG tablet Take 65 mg by mouth daily with breakfast.    . fish oil-omega-3 fatty acids 1000 MG capsule Take 1 capsule by mouth daily.      Marland Kitchen JANTOVEN 2.5 MG tablet TAKE 1 TABLET DAILY OR AS  DIRECTED BY COUMADIN CLINIC 90 tablet 3  . losartan-hydrochlorothiazide (HYZAAR) 100-25 MG per tablet Take 1 tablet by mouth daily.      . Multiple Vitamins-Minerals (CENTRUM SILVER PO) Take 1 tablet by mouth daily.      . nebivolol (BYSTOLIC) 5 MG tablet Take 1 tablet (5 mg total) by mouth daily. 90 tablet 3  .  Probiotic Product (PHILLIPS COLON HEALTH PO) Take 1 capsule by mouth daily.      No current facility-administered medications for this visit.    REVIEW OF SYSTEMS:  [X]  denotes positive finding, [ ]  denotes negative finding Cardiac  Comments:  Chest pain or chest pressure:    Shortness of breath upon exertion:    Short of breath when lying flat:    Irregular heart rhythm:        Vascular    Pain in calf, thigh, or hip brought on by ambulation:    Pain in feet at night that wakes you up from your sleep:     Blood clot in your veins:    Leg swelling:         Pulmonary    Oxygen at home:    Productive cough:     Wheezing:         Neurologic    Sudden weakness in arms or legs:     Sudden numbness in arms or legs:     Sudden onset of difficulty speaking or slurred speech:    Temporary loss of vision in one  eye:     Problems with dizziness:         Gastrointestinal    Blood in stool:     Vomited blood:         Genitourinary    Burning when urinating:     Blood in urine:        Psychiatric    Major depression:         Hematologic    Bleeding problems:    Problems with blood clotting too easily:        Skin    Rashes or ulcers:        Constitutional    Fever or chills:      PHYSICAL EXAM: Filed Vitals:   10/06/15 1016  BP: 130/62  Pulse: 55  Temp: 98.2 F (36.8 C)  TempSrc: Oral  Height: 5' 8.5" (1.74 m)  Weight: 191 lb (86.637 kg)  SpO2: 98%    GENERAL: The patient is a well-nourished male, in no acute distress. The vital signs are documented above. CARDIAC: There is a regular rate and rhythm.  VASCULAR: I do not detect carotid bruits. On the right side, he has a palpable femoral, popliteal, and dorsalis pedis pulse. On the left side, he has a palpable femoral pulse. I cannot palpate a popliteal or pedal pulses. He has no significant lower extremity swelling. PULMONARY: There is good air exchange bilaterally without wheezing or rales. ABDOMEN: Soft and non-tender with normal pitched bowel sounds.  MUSCULOSKELETAL: There are no major deformities or cyanosis. NEUROLOGIC: No focal weakness or paresthesias are detected. SKIN: There are no ulcers or rashes noted. He has hyperpigmentation bilaterally consistent with chronic venous insufficiency. PSYCHIATRIC: The patient has a normal affect.  DATA:  I have independently interpreted his arterial Doppler study today which shows triphasic posterior tibial signal on the right with a biphasic anterior tibial signal. He has monophasic signals in the left foot. ABIs could not be obtained as the vessels were calcified and not compressible.  MEDICAL ISSUES:  LEFT LOWER EXTREMITY INFRAINGUINAL ARTERIAL OCCLUSIVE DISEASE: Based on his exam and Doppler study he does have evidence of infrainguinal arterial occlusive disease on the  left. However he is essentially asymptomatic. He is not a smoker. He remains fairly active taking his grandson to baseball games. I've encouraged him to remain active. We'll stretches follow about  18 months LC him back at that time. I have ordered ABIs for that time. He knows to call sooner if he has problems.   Deitra Mayo [facility] [place]: [phone]

## 2015-10-07 NOTE — Addendum Note (Signed)
Addended by: Dorthula Rue L on: 10/07/2015 02:10 PM   Modules accepted: Orders

## 2015-10-27 ENCOUNTER — Ambulatory Visit (INDEPENDENT_AMBULATORY_CARE_PROVIDER_SITE_OTHER): Payer: Medicare Other | Admitting: *Deleted

## 2015-10-27 DIAGNOSIS — I4891 Unspecified atrial fibrillation: Secondary | ICD-10-CM | POA: Diagnosis not present

## 2015-10-27 DIAGNOSIS — I482 Chronic atrial fibrillation: Secondary | ICD-10-CM | POA: Diagnosis not present

## 2015-10-27 DIAGNOSIS — Z7901 Long term (current) use of anticoagulants: Secondary | ICD-10-CM | POA: Diagnosis not present

## 2015-10-27 DIAGNOSIS — Z5181 Encounter for therapeutic drug level monitoring: Secondary | ICD-10-CM | POA: Diagnosis not present

## 2015-10-27 DIAGNOSIS — I4821 Permanent atrial fibrillation: Secondary | ICD-10-CM

## 2015-10-27 LAB — POCT INR: INR: 3.5

## 2015-11-08 ENCOUNTER — Emergency Department (HOSPITAL_COMMUNITY): Payer: Medicare Other

## 2015-11-08 ENCOUNTER — Encounter (HOSPITAL_COMMUNITY): Payer: Self-pay | Admitting: Family Medicine

## 2015-11-08 ENCOUNTER — Inpatient Hospital Stay (HOSPITAL_COMMUNITY)
Admission: EM | Admit: 2015-11-08 | Discharge: 2015-11-16 | DRG: 239 | Disposition: A | Discharge status: Skilled Nursing Facility | Payer: Medicare Other | Attending: Oncology | Admitting: Oncology

## 2015-11-08 ENCOUNTER — Emergency Department (HOSPITAL_COMMUNITY): Admit: 2015-11-08 | Payer: Medicare Other

## 2015-11-08 DIAGNOSIS — J810 Acute pulmonary edema: Secondary | ICD-10-CM | POA: Diagnosis not present

## 2015-11-08 DIAGNOSIS — E1152 Type 2 diabetes mellitus with diabetic peripheral angiopathy with gangrene: Secondary | ICD-10-CM | POA: Diagnosis present

## 2015-11-08 DIAGNOSIS — Z880 Allergy status to penicillin: Secondary | ICD-10-CM

## 2015-11-08 DIAGNOSIS — L8990 Pressure ulcer of unspecified site, unspecified stage: Secondary | ICD-10-CM | POA: Insufficient documentation

## 2015-11-08 DIAGNOSIS — M170 Bilateral primary osteoarthritis of knee: Secondary | ICD-10-CM | POA: Diagnosis present

## 2015-11-08 DIAGNOSIS — L02612 Cutaneous abscess of left foot: Secondary | ICD-10-CM | POA: Diagnosis present

## 2015-11-08 DIAGNOSIS — E1149 Type 2 diabetes mellitus with other diabetic neurological complication: Secondary | ICD-10-CM | POA: Diagnosis not present

## 2015-11-08 DIAGNOSIS — Z978 Presence of other specified devices: Secondary | ICD-10-CM

## 2015-11-08 DIAGNOSIS — E876 Hypokalemia: Secondary | ICD-10-CM | POA: Diagnosis present

## 2015-11-08 DIAGNOSIS — M79609 Pain in unspecified limb: Secondary | ICD-10-CM | POA: Diagnosis not present

## 2015-11-08 DIAGNOSIS — Z951 Presence of aortocoronary bypass graft: Secondary | ICD-10-CM | POA: Diagnosis not present

## 2015-11-08 DIAGNOSIS — Z7982 Long term (current) use of aspirin: Secondary | ICD-10-CM

## 2015-11-08 DIAGNOSIS — M7989 Other specified soft tissue disorders: Secondary | ICD-10-CM | POA: Diagnosis not present

## 2015-11-08 DIAGNOSIS — Z9842 Cataract extraction status, left eye: Secondary | ICD-10-CM | POA: Diagnosis not present

## 2015-11-08 DIAGNOSIS — I10 Essential (primary) hypertension: Secondary | ICD-10-CM | POA: Diagnosis present

## 2015-11-08 DIAGNOSIS — A4901 Methicillin susceptible Staphylococcus aureus infection, unspecified site: Secondary | ICD-10-CM | POA: Diagnosis present

## 2015-11-08 DIAGNOSIS — Z7901 Long term (current) use of anticoagulants: Secondary | ICD-10-CM | POA: Diagnosis not present

## 2015-11-08 DIAGNOSIS — R410 Disorientation, unspecified: Secondary | ICD-10-CM | POA: Diagnosis present

## 2015-11-08 DIAGNOSIS — I501 Left ventricular failure: Secondary | ICD-10-CM

## 2015-11-08 DIAGNOSIS — I2510 Atherosclerotic heart disease of native coronary artery without angina pectoris: Secondary | ICD-10-CM | POA: Diagnosis present

## 2015-11-08 DIAGNOSIS — E785 Hyperlipidemia, unspecified: Secondary | ICD-10-CM | POA: Diagnosis present

## 2015-11-08 DIAGNOSIS — B9689 Other specified bacterial agents as the cause of diseases classified elsewhere: Secondary | ICD-10-CM | POA: Diagnosis not present

## 2015-11-08 DIAGNOSIS — Z9841 Cataract extraction status, right eye: Secondary | ICD-10-CM | POA: Diagnosis not present

## 2015-11-08 DIAGNOSIS — I771 Stricture of artery: Secondary | ICD-10-CM | POA: Diagnosis present

## 2015-11-08 DIAGNOSIS — E1142 Type 2 diabetes mellitus with diabetic polyneuropathy: Secondary | ICD-10-CM | POA: Diagnosis present

## 2015-11-08 DIAGNOSIS — D62 Acute posthemorrhagic anemia: Secondary | ICD-10-CM | POA: Diagnosis not present

## 2015-11-08 DIAGNOSIS — J9601 Acute respiratory failure with hypoxia: Secondary | ICD-10-CM | POA: Diagnosis not present

## 2015-11-08 DIAGNOSIS — R509 Fever, unspecified: Secondary | ICD-10-CM | POA: Diagnosis not present

## 2015-11-08 DIAGNOSIS — L0390 Cellulitis, unspecified: Secondary | ICD-10-CM

## 2015-11-08 DIAGNOSIS — I4821 Permanent atrial fibrillation: Secondary | ICD-10-CM | POA: Diagnosis present

## 2015-11-08 DIAGNOSIS — I252 Old myocardial infarction: Secondary | ICD-10-CM | POA: Diagnosis not present

## 2015-11-08 DIAGNOSIS — Z89612 Acquired absence of left leg above knee: Secondary | ICD-10-CM | POA: Diagnosis not present

## 2015-11-08 DIAGNOSIS — Z882 Allergy status to sulfonamides status: Secondary | ICD-10-CM | POA: Diagnosis not present

## 2015-11-08 DIAGNOSIS — I5032 Chronic diastolic (congestive) heart failure: Secondary | ICD-10-CM | POA: Diagnosis present

## 2015-11-08 DIAGNOSIS — E11621 Type 2 diabetes mellitus with foot ulcer: Secondary | ICD-10-CM | POA: Diagnosis present

## 2015-11-08 DIAGNOSIS — I739 Peripheral vascular disease, unspecified: Secondary | ICD-10-CM | POA: Diagnosis present

## 2015-11-08 DIAGNOSIS — I509 Heart failure, unspecified: Secondary | ICD-10-CM | POA: Diagnosis not present

## 2015-11-08 DIAGNOSIS — L03119 Cellulitis of unspecified part of limb: Secondary | ICD-10-CM

## 2015-11-08 DIAGNOSIS — Z888 Allergy status to other drugs, medicaments and biological substances status: Secondary | ICD-10-CM | POA: Diagnosis not present

## 2015-11-08 DIAGNOSIS — R0602 Shortness of breath: Secondary | ICD-10-CM

## 2015-11-08 DIAGNOSIS — Z961 Presence of intraocular lens: Secondary | ICD-10-CM | POA: Diagnosis present

## 2015-11-08 DIAGNOSIS — E1140 Type 2 diabetes mellitus with diabetic neuropathy, unspecified: Secondary | ICD-10-CM | POA: Diagnosis not present

## 2015-11-08 DIAGNOSIS — J9600 Acute respiratory failure, unspecified whether with hypoxia or hypercapnia: Secondary | ICD-10-CM

## 2015-11-08 DIAGNOSIS — I493 Ventricular premature depolarization: Secondary | ICD-10-CM | POA: Diagnosis present

## 2015-11-08 DIAGNOSIS — I482 Chronic atrial fibrillation: Secondary | ICD-10-CM | POA: Diagnosis present

## 2015-11-08 DIAGNOSIS — L03116 Cellulitis of left lower limb: Secondary | ICD-10-CM | POA: Diagnosis present

## 2015-11-08 DIAGNOSIS — I96 Gangrene, not elsewhere classified: Secondary | ICD-10-CM

## 2015-11-08 DIAGNOSIS — R000 Tachycardia, unspecified: Secondary | ICD-10-CM | POA: Insufficient documentation

## 2015-11-08 DIAGNOSIS — J80 Acute respiratory distress syndrome: Secondary | ICD-10-CM | POA: Diagnosis not present

## 2015-11-08 DIAGNOSIS — E119 Type 2 diabetes mellitus without complications: Secondary | ICD-10-CM

## 2015-11-08 DIAGNOSIS — R7303 Prediabetes: Secondary | ICD-10-CM

## 2015-11-08 DIAGNOSIS — Z955 Presence of coronary angioplasty implant and graft: Secondary | ICD-10-CM

## 2015-11-08 DIAGNOSIS — L97519 Non-pressure chronic ulcer of other part of right foot with unspecified severity: Secondary | ICD-10-CM | POA: Diagnosis not present

## 2015-11-08 DIAGNOSIS — B9561 Methicillin susceptible Staphylococcus aureus infection as the cause of diseases classified elsewhere: Secondary | ICD-10-CM | POA: Diagnosis not present

## 2015-11-08 DIAGNOSIS — N179 Acute kidney failure, unspecified: Secondary | ICD-10-CM | POA: Diagnosis present

## 2015-11-08 DIAGNOSIS — R269 Unspecified abnormalities of gait and mobility: Secondary | ICD-10-CM | POA: Insufficient documentation

## 2015-11-08 DIAGNOSIS — D72829 Elevated white blood cell count, unspecified: Secondary | ICD-10-CM | POA: Insufficient documentation

## 2015-11-08 DIAGNOSIS — I25810 Atherosclerosis of coronary artery bypass graft(s) without angina pectoris: Secondary | ICD-10-CM | POA: Insufficient documentation

## 2015-11-08 DIAGNOSIS — L89152 Pressure ulcer of sacral region, stage 2: Secondary | ICD-10-CM | POA: Diagnosis present

## 2015-11-08 HISTORY — DX: Malignant neoplasm of head, face and neck: C76.0

## 2015-11-08 HISTORY — DX: Iron deficiency anemia, unspecified: D50.9

## 2015-11-08 HISTORY — DX: Type 2 diabetes mellitus with diabetic polyneuropathy: E11.42

## 2015-11-08 HISTORY — DX: Type 2 diabetes mellitus without complications: E11.9

## 2015-11-08 HISTORY — DX: Personal history of other medical treatment: Z92.89

## 2015-11-08 HISTORY — DX: Cellulitis, unspecified: L03.90

## 2015-11-08 LAB — COMPREHENSIVE METABOLIC PANEL
ALT: 23 U/L (ref 17–63)
ANION GAP: 11 (ref 5–15)
AST: 23 U/L (ref 15–41)
Albumin: 2.7 g/dL — ABNORMAL LOW (ref 3.5–5.0)
Alkaline Phosphatase: 96 U/L (ref 38–126)
BUN: 20 mg/dL (ref 6–20)
CHLORIDE: 104 mmol/L (ref 101–111)
CO2: 24 mmol/L (ref 22–32)
Calcium: 7.1 mg/dL — ABNORMAL LOW (ref 8.9–10.3)
Creatinine, Ser: 1.4 mg/dL — ABNORMAL HIGH (ref 0.61–1.24)
GFR, EST AFRICAN AMERICAN: 54 mL/min — AB (ref >60)
GFR, EST NON AFRICAN AMERICAN: 47 mL/min — AB (ref >60)
Glucose, Bld: 158 mg/dL — ABNORMAL HIGH (ref 65–99)
POTASSIUM: 2.9 mmol/L — AB (ref 3.5–5.1)
Sodium: 139 mmol/L (ref 135–145)
Total Bilirubin: 1.8 mg/dL — ABNORMAL HIGH (ref 0.3–1.2)
Total Protein: 5.7 g/dL — ABNORMAL LOW (ref 6.5–8.1)

## 2015-11-08 LAB — I-STAT CG4 LACTIC ACID, ED: Lactic Acid, Venous: 2.19 mmol/L (ref 0.5–2.0)

## 2015-11-08 LAB — CBC WITH DIFFERENTIAL/PLATELET
BASOS ABS: 0 10*3/uL (ref 0.0–0.1)
Basophils Relative: 0 %
EOS PCT: 0 %
Eosinophils Absolute: 0 10*3/uL (ref 0.0–0.7)
HCT: 32.4 % — ABNORMAL LOW (ref 39.0–52.0)
Hemoglobin: 11 g/dL — ABNORMAL LOW (ref 13.0–17.0)
LYMPHS PCT: 4 %
Lymphs Abs: 0.8 10*3/uL (ref 0.7–4.0)
MCH: 31.6 pg (ref 26.0–34.0)
MCHC: 34 g/dL (ref 30.0–36.0)
MCV: 93.1 fL (ref 78.0–100.0)
MONO ABS: 2.2 10*3/uL — AB (ref 0.1–1.0)
Monocytes Relative: 10 %
Neutro Abs: 18.8 10*3/uL — ABNORMAL HIGH (ref 1.7–7.7)
Neutrophils Relative %: 86 %
PLATELETS: 221 10*3/uL (ref 150–400)
RBC: 3.48 MIL/uL — ABNORMAL LOW (ref 4.22–5.81)
RDW: 14.1 % (ref 11.5–15.5)
WBC: 21.8 10*3/uL — ABNORMAL HIGH (ref 4.0–10.5)

## 2015-11-08 LAB — LACTIC ACID, PLASMA: Lactic Acid, Venous: 1.8 mmol/L (ref 0.5–2.0)

## 2015-11-08 LAB — GLUCOSE, CAPILLARY: Glucose-Capillary: 123 mg/dL — ABNORMAL HIGH (ref 65–99)

## 2015-11-08 LAB — PROTIME-INR
INR: 2.81 — ABNORMAL HIGH (ref 0.00–1.49)
Prothrombin Time: 29.2 s — ABNORMAL HIGH (ref 11.6–15.2)

## 2015-11-08 MED ORDER — ACETAMINOPHEN 325 MG PO TABS
650.0000 mg | ORAL_TABLET | Freq: Once | ORAL | Status: AC
  Administered 2015-11-08: 650 mg via ORAL
  Filled 2015-11-08: qty 2

## 2015-11-08 MED ORDER — INSULIN ASPART 100 UNIT/ML ~~LOC~~ SOLN: 0.0000 [IU] | Freq: Every day | SUBCUTANEOUS | Status: DC

## 2015-11-08 MED ORDER — VANCOMYCIN HCL 10 G IV SOLR
1500.0000 mg | INTRAVENOUS | Status: DC
  Administered 2015-11-09 – 2015-11-10 (×2): 1500 mg via INTRAVENOUS
  Filled 2015-11-08 (×4): qty 1500

## 2015-11-08 MED ORDER — SODIUM CHLORIDE 0.9 % IV SOLN
INTRAVENOUS | Status: DC
  Administered 2015-11-08: 23:00:00 via INTRAVENOUS

## 2015-11-08 MED ORDER — SODIUM CHLORIDE 0.9 % IJ SOLN
3.0000 mL | Freq: Two times a day (BID) | INTRAMUSCULAR | Status: DC
  Administered 2015-11-08 – 2015-11-13 (×8): 3 mL via INTRAVENOUS
  Administered 2015-11-14: 10 mL via INTRAVENOUS
  Administered 2015-11-14 – 2015-11-16 (×4): 3 mL via INTRAVENOUS

## 2015-11-08 MED ORDER — PHYTONADIONE 5 MG PO TABS
2.5000 mg | ORAL_TABLET | Freq: Once | ORAL | Status: AC
Start: 2015-11-08 — End: 2015-11-08
  Administered 2015-11-08: 2.5 mg via ORAL
  Filled 2015-11-08: qty 1

## 2015-11-08 MED ORDER — INSULIN ASPART 100 UNIT/ML ~~LOC~~ SOLN
0.0000 [IU] | Freq: Three times a day (TID) | SUBCUTANEOUS | Status: DC
  Administered 2015-11-09 (×2): 1 [IU] via SUBCUTANEOUS
  Administered 2015-11-09: 2 [IU] via SUBCUTANEOUS
  Administered 2015-11-12 – 2015-11-14 (×3): 1 [IU] via SUBCUTANEOUS

## 2015-11-08 MED ORDER — NEBIVOLOL HCL 5 MG PO TABS
5.0000 mg | ORAL_TABLET | Freq: Every day | ORAL | Status: DC
  Administered 2015-11-09 – 2015-11-10 (×2): 5 mg via ORAL
  Filled 2015-11-08 (×2): qty 1

## 2015-11-08 MED ORDER — DEXTROSE 5 % IV SOLN
1.0000 g | Freq: Three times a day (TID) | INTRAVENOUS | Status: DC
  Administered 2015-11-08 – 2015-11-10 (×6): 1 g via INTRAVENOUS
  Filled 2015-11-08 (×10): qty 1

## 2015-11-08 MED ORDER — POTASSIUM CHLORIDE CRYS ER 20 MEQ PO TBCR
40.0000 meq | EXTENDED_RELEASE_TABLET | Freq: Once | ORAL | Status: AC
  Administered 2015-11-08: 40 meq via ORAL
  Filled 2015-11-08: qty 2

## 2015-11-08 MED ORDER — VANCOMYCIN HCL 10 G IV SOLR
1750.0000 mg | Freq: Once | INTRAVENOUS | Status: AC
  Administered 2015-11-08: 1750 mg via INTRAVENOUS
  Filled 2015-11-08: qty 1750

## 2015-11-08 MED ORDER — SODIUM CHLORIDE 0.9 % IV BOLUS (SEPSIS)
500.0000 mL | Freq: Once | INTRAVENOUS | Status: AC
  Administered 2015-11-08: 500 mL via INTRAVENOUS

## 2015-11-08 NOTE — ED Notes (Signed)
Pt here for left middle toe that has turned black. sts also LLE red and swollen. sts 3 days and severe pain. Sent here to see vascular specialists.

## 2015-11-08 NOTE — Progress Notes (Signed)
ANTIBIOTIC CONSULT NOTE - INITIAL  Pharmacy Consult for vanc/aztreonam Indication: cellulitis  Allergies  Allergen Reactions  . Lipitor [Atorvastatin] Other (See Comments)    SEVERE HEADACHE  . Simvastatin Other (See Comments)    MUSCLE ACHES  . Penicillins Rash  . Sulfa Antibiotics Rash    Patient Measurements:   Adjusted Body Weight:   Vital Signs: Temp: 99.2 F (37.3 C) (01/16 1026) Temp Source: Oral (01/16 1026) BP: 129/59 mmHg (01/16 1026) Pulse Rate: 78 (01/16 1026) Intake/Output from previous day:   Intake/Output from this shift:    Labs:  Recent Labs  11/08/15 1032  WBC 21.8*  HGB 11.0*  PLT 221  CREATININE 1.40*   CrCl cannot be calculated (Unknown ideal weight.). No results for input(s): VANCOTROUGH, VANCOPEAK, VANCORANDOM, GENTTROUGH, GENTPEAK, GENTRANDOM, TOBRATROUGH, TOBRAPEAK, TOBRARND, AMIKACINPEAK, AMIKACINTROU, AMIKACIN in the last 72 hours.   Microbiology: No results found for this or any previous visit (from the past 720 hour(s)).  Medical History: Past Medical History  Diagnosis Date  . Arteriosclerotic cardiovascular disease (ASCVD)     CABG in 1990s; 2002 total obstruction of LAD, CX and RCA with patent grafts and nl EF; Stress nuc. 2008 - mild LV dilation; normal EF; questionable small anteroapical scar; no ischemia  . Atrial fibrillation ([place])   . Chronic anticoagulation   . Arthritis     knees  . Pedal edema     "not a lot", per pt.  . Neuropathy due to type 2 diabetes mellitus ([place])     bilateral lower leg, hands  . Hypertension     states under control with meds., has been on med. x "long time"  . Diet-controlled diabetes mellitus ([place])   . History of squamous cell carcinoma 1990s    left jaw  . Peripheral vascular disease ([place])   . 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 Cataracts, bilateral   . Dental crowns present   . Runny nose 07/27/2015    clear drainage, per pt.    Assessment: 57 yom  with cellulitis of LL leg. Per MD - significant PCN allergy (documented as rash per [facility]). Pharmacy consulted to dose vanc/aztreonam for cellulitis. Afeb, wbc 21.8 on admit. SCr 1.4, CrCl~54.  1/16 vanc>> 1/16 aztreonam>>  1/16 BCx2>>  Goal of Therapy:  Vancomycin trough level 10-15 mcg/ml  Plan:  Aztreonam 1g IV q8h Vanc 1758m IV x1; then Vanc 15072mIV q24h Monitor clinical progress, c/s, renal function, abx plan/LOT VT@SS  as indicated  HaElicia LampPharmD, BCMethodist Hospitallinical Pharmacist Pager [phone]/16/2017 11:58 AM

## 2015-11-08 NOTE — ED Notes (Signed)
Vascular MD at the bedside

## 2015-11-08 NOTE — H&P (Signed)
Date: 11/08/2015               Patient Name:  [name] MRN: [id number]  DOB: 1936-02-16 Age / Sex: 80 y.o., male   PCP: Lemmie Evens, MD         Medical Service: Internal Medicine Teaching Service         Attending Physician: Dr. Julianne [place], MD    First Contact: Dr. Loleta Chance Pager: [phone]  Second Contact: Dr. Albin Felling Pager: [phone]       After Hours (After 5p/  First Contact Pager: [phone]  weekends / holidays): Second Contact Pager: [phone]   Chief Complaint: "My left leg is red."  History of Present Illness:  [name] is a 80 year old man with history of coronary artery disease status post CABG in 1990s and stent placements in 2002, chronic atrial fibrillation on warfarin, type 2 diabetes with neuropathy, hypertension, history of squamous cell carcinoma of the left jaw status post resection in 1990s, presenting with a red hot left lower leg.  Three days ago, while getting out of the shower, he noticed his left foot was more swollen than usual. That night, his left foot quickly became red, and the redness spread up his left leg. His foot became more and more painful, and he had chills and night sweats at night, until Sunday afternoon the pain completely abated he was watching NFL playoffs. Some time during the games, he noticed that his left second toe again turning black. Today, he went to his podiatrist who referred him to the emergency department. He denies any claudication symptoms and enjoys playing softball. In December, he got ABIs but could not get reliable calculation because his vessels were non-compressible. Prior to that, he had TBIs of 0.46 in the left and 0.76 in the right. He denies any headache, chest pain, abdominal pain; review of systems was otherwise non-revealing.  Meds: Current Facility-Administered Medications  Medication Dose Route Frequency Provider Last Rate Last Dose  . aztreonam (AZACTAM) 1 g in dextrose 5 % 50 mL IVPB  1 g  Intravenous 3 times per day Romona Curls, [facility]      . [START ON 11/09/2015] vancomycin (VANCOCIN) 1,500 mg in sodium chloride 0.9 % 500 mL IVPB  1,500 mg Intravenous Q24H Romona Curls, [facility]      . vancomycin (VANCOCIN) 1,750 mg in sodium chloride 0.9 % 500 mL IVPB  1,750 mg Intravenous Once Romona Curls, RPH 250 mL/hr at 11/08/15 1240 1,750 mg at 11/08/15 1240   Current Outpatient Prescriptions  Medication Sig Dispense Refill  . amLODipine (NORVASC) 10 MG tablet Take 1 tablet (10 mg total) by mouth daily. 90 tablet 3  . aspirin 81 MG tablet Take 81 mg by mouth daily.    . cloNIDine (CATAPRES - DOSED IN MG/24 HR) 0.3 mg/24hr patch APPLY 1 PATCH ONCE WEEKLY 12 patch 3  . Difluprednate (DUREZOL) 0.05 % EMUL Apply 1 drop to eye 2 (two) times daily.    . ferrous sulfate 325 (65 FE) MG tablet Take 65 mg by mouth daily with breakfast.    . fish oil-omega-3 fatty acids 1000 MG capsule Take 1 capsule by mouth daily.      Marland Kitchen JANTOVEN 2.5 MG tablet TAKE 1 TABLET DAILY OR AS  DIRECTED BY COUMADIN CLINIC 90 tablet 3  . losartan-hydrochlorothiazide (HYZAAR) 100-25 MG per tablet Take 1 tablet by mouth daily.      . Multiple Vitamins-Minerals (CENTRUM SILVER PO) Take  1 tablet by mouth daily.      . nebivolol (BYSTOLIC) 5 MG tablet Take 1 tablet (5 mg total) by mouth daily. 90 tablet 3  . Probiotic Product (PHILLIPS COLON HEALTH PO) Take 1 capsule by mouth daily.       Allergies: Allergies as of 11/08/2015 - Review Complete 11/08/2015  Allergen Reaction Noted  . Lipitor [atorvastatin] Other (See Comments) 08/11/2013  . Simvastatin Other (See Comments) 02/12/2014  . Penicillins Rash 07/27/2015  . Sulfa antibiotics Rash 07/27/2015   Past Medical History  Diagnosis Date  . Arteriosclerotic cardiovascular disease (ASCVD)     CABG in 1990s; 2002 total obstruction of LAD, CX and RCA with patent grafts and nl EF; Stress nuc. 2008 - mild LV dilation; normal EF; questionable small anteroapical scar; no ischemia    . Atrial fibrillation ([place])   . Chronic anticoagulation   . Arthritis     knees  . Pedal edema     "not a lot", per pt.  . Neuropathy due to type 2 diabetes mellitus ([place])     bilateral lower leg, hands  . Hypertension     states under control with meds., has been on med. x "long time"  . Diet-controlled diabetes mellitus ([place])   . History of squamous cell carcinoma 1990s    left jaw  . Peripheral vascular disease ([place])   . 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 Cataracts, bilateral   . Dental crowns present   . Runny nose 07/27/2015    clear drainage, per pt.   Past Surgical History  Procedure Laterality Date  . Coronary artery bypass graft  1990's  . Squamous cell carcinoma excision Left 1993    jaw  . Tonsillectomy    . Orif tibia fracture Left   . Colonoscopy N/A 11/27/2014    Procedure: COLONOSCOPY;  Surgeon: Rogene Houston, MD;  [facility];  Service: Endoscopy;  Laterality: N/A;  830  . Incision and drainage abscess Left 06/16/2005    wide exc. abscess 5th toe  . Cardiac catheterization  04/16/2001  . Mandibular hardware removal Left 08/02/2015    Procedure:  HARDWARE REMOVAL TWO MANDIBULAR SCREWS;  Surgeon: Jannette Fogo, DDS;  Location: [facility];  Service: Oral Surgery;  Laterality: Left;   Family History  Problem Relation Age of Onset  . Heart disease Mother    Social History   Social History  . Marital Status: Married    Spouse Name: N/A  . Number of Children: N/A  . Years of Education: N/A   Occupational History  . Not on file.   Social History Main Topics  . Smoking status: Never Smoker   . Smokeless tobacco: Never Used  . Alcohol Use: No  . Drug Use: No  . Sexual Activity: Not on file   Other Topics Concern  . Not on file   Social History Narrative    Review of Systems: Per HPI  Physical Exam: Blood pressure 126/56, pulse 85, temperature 99.2 F (37.3 C), temperature source Oral, resp.  rate 22, SpO2 97 %. General: well-appearing man resting in bed comfortably, appropriately conversational HEENT: no scleral icterus, extra-ocular muscles intact, oropharynx without lesions, scar inferior to left mandible from [facility] resection Cardiac: irregular rhythm, no rubs, murmurs or gallops Pulm: breathing well, clear to auscultation bilaterally Abd: bowel sounds normal, soft, nondistended, non-tender Ext: His left foot has very hot poorly-defined erythematous plaques extending up to 10cm below  the knee with cutaneous anesthesia and surrounding edema. The left second toe is black with peripheral petechiae. He has no sensation from the mid-shin down. I couldn't palpate dorsalis pedal pulses on the left. On the right, there are hyperpigmented plaques from the ankle to the mid-shin; he has sensation from the ankle upward. DPs are 1+ on the right. Lymph: no cervical or supraclavicular lymphadenopathy Skin: scaly erythematous plaques on bilateral tops of ears  Lab results: Basic Metabolic Panel:  Recent Labs  11/08/15 1032  NA 139  K 2.9*  CL 104  CO2 24  GLUCOSE 158*  BUN 20  CREATININE 1.40*  CALCIUM 7.1*   Liver Function Tests:  Recent Labs  11/08/15 1032  AST 23  ALT 23  ALKPHOS 96  BILITOT 1.8*  PROT 5.7*  ALBUMIN 2.7*   CBC:  Recent Labs  11/08/15 1032  WBC 21.8*  NEUTROABS 18.8*  HGB 11.0*  HCT 32.4*  MCV 93.1  PLT 221   Coagulation:  Recent Labs  11/08/15 1032  LABPROT 29.2*  INR 2.81*   Imaging results:  Dg Foot Complete Left  11/08/2015  CLINICAL DATA:  Painful swollen left second toe. EXAM: LEFT FOOT - COMPLETE 3+ VIEW COMPARISON:  10/03/2005 FINDINGS: Postoperative changes involving the fourth metatarsal head with a single screw in place. Moderate degenerative changes involving the metatarsal phalangeal joints with moderate extension of the proximal phalanges in relation to the metatarsal heads. No acute bony findings or destructive bony changes.  Extensive small vessel calcifications are noted. There are large calcaneal heel spurs and extensive calcification along the Achilles tendon. IMPRESSION: No acute bony findings or destructive bony changes. Electronically Signed   By: Marijo Sanes M.D.   On: 11/08/2015 12:39   Assessment & Plan by Problem: Mr. Gabbard is a 80 year old man with extensive atherosclerotic disease and atrial fibrillation on warfarin presenting with a very rapidly progressive erythematous hot lower extremity with cutaneous anesthesia and a necrotic-appearing toe; I'm concerned this may be cellulitis transforming into necrotizing fasciitis given the rapidity of the progression. Also, with necrosis being so distal, I wonder if this is concomitant arterial insufficiency. He had dopplers on the left foot that showed decent flow in the DPs. Vascular surgery saw him and will do an abdominal arteriogram tomorrow. I'm hoping his acute kidney injury will resolve by then but they may want to consider holding off. Based off of his anatomy, they may attempt percutaneous intervention but he might need bypass depending on what they see. Given his INR was 2.8 today, we will reverse his elevated prothrombin time with 2.5 mg vitamin K tonight, and can use fresh frozen plasma in the morning if his INR is still supratherapeutic. We will continue vancomycin and aztreonam tonight.  Hot erythematous left lower extremity: Per above, I'm beginning to wonder if this is cellulitis transforming into necrotizing fasciitis with or without arterial insufficiency. -Continue vancomycin and aztreonam -Vascular surgery will do abdominal arteriogram tomorrow -CBC tomorrow  Atrial fibrillation: Rates well controlled. Per above, we will reverse his elevated PT prior to his arteriogram tomorrow. -Vitamin K 2.5 mg tonight -Holding warfarin -Continue nebivolol 5 mg daily  Acute kidney injury: Creatinine 1.4; his baseline is less than 1. I suspect this is  pre-renal. -Consider delaying arteriogram -Re-hydrating with normal saline at 75cc/hr -BMP tomorrow  Hypertension: Pressures well-controlled on home regiment. -Continue amlodipine 10 mg daily -Continue clonidine patch 0.3 mg daily -Continue losartan 100 mg daily -Continue hydrochlorothiazide 25 mg daily -Continue nebivolol 5  mg daily  Pre-diabetes: Last A1c 6.6 in 2010. This is diet-controlled. -Checking A1c  History of squamous cell carcinoma: He had left-sided mandibular squamous cell carcinoma that was resected in the mid 90s.  Dispo: Disposition is deferred at this time, awaiting improvement of current medical problems. Anticipated discharge in approximately 2-3 day(s).   The patient does have a current PCP Lemmie Evens, MD) and does need an [facility] hospital follow-up appointment after discharge.  The patient does not know have transportation limitations that hinder transportation to clinic appointments.  Signed: Loleta Chance, MD 11/08/2015, 2:35 PM

## 2015-11-08 NOTE — ED Notes (Signed)
Pt remains monitored by blood pressure, pulse ox, and 5 lead. Pts family noted to be at bedside.

## 2015-11-08 NOTE — Progress Notes (Signed)
Patient has arrived on unit from ED. Patient oriented to unit, placed on tele, VS were normal.

## 2015-11-08 NOTE — ED Notes (Signed)
Meal tray ordered for pt  

## 2015-11-08 NOTE — ED Notes (Signed)
Spoke with pharmacy regarding Vancomycin.  Pharm to send

## 2015-11-08 NOTE — Discharge Summary (Signed)
Name: [name] MRN: [id number] DOB: Nov 22, 1935 80 y.o. PCP: Lemmie Evens, MD  Date of Admission: 11/08/2015 10:50 AM Date of Discharge: 11/16/2015 Attending Physician: Annia Belt, MD  Discharge Diagnosis: 1. Left lower extremity necrotizing cellulitis managed with a left above knee amputation 2. Chronic atrial fibrillation no warfarin 3. Hypertension  Discharge Medications:   Medication List    STOP taking these medications        cloNIDine 0.3 mg/24hr patch  Commonly known as:  CATAPRES - Dosed in mg/24 hr      TAKE these medications        acetaminophen 325 MG tablet  Commonly known as:  TYLENOL  Take 2 tablets (650 mg total) by mouth every 6 (six) hours as needed for mild pain or moderate pain.     amLODipine 10 MG tablet  Commonly known as:  NORVASC  Take 1 tablet (10 mg total) by mouth daily.     aspirin 81 MG tablet  Take 81 mg by mouth daily.     CENTRUM SILVER PO  Take 1 tablet by mouth daily.     cephALEXin 500 MG capsule  Commonly known as:  KEFLEX  Take 1 capsule (500 mg total) by mouth every 6 (six) hours.     DUREZOL 0.05 % Emul  Generic drug:  Difluprednate  Apply 1 drop to eye 2 (two) times daily.     feeding supplement (PRO-STAT SUGAR FREE 64) Liqd  Take 30 mLs by mouth 2 (two) times daily.     feeding supplement Liqd  Take 1 Container by mouth 3 (three) times daily between meals.     ferrous sulfate 325 (65 FE) MG tablet  Take 65 mg by mouth daily with breakfast.     fish oil-omega-3 fatty acids 1000 MG capsule  Take 1 capsule by mouth daily.     JANTOVEN 2.5 MG tablet  Generic drug:  warfarin  TAKE 1 TABLET DAILY OR AS  DIRECTED BY COUMADIN CLINIC     losartan-hydrochlorothiazide 100-25 MG tablet  Commonly known as:  HYZAAR  Take 1 tablet by mouth daily.     nebivolol 5 MG tablet  Commonly known as:  BYSTOLIC  Take 1 tablet (5 mg total) by mouth daily.     PHILLIPS COLON HEALTH PO  Take 1 capsule by mouth  daily.     silver sulfADIAZINE 1 % cream  Commonly known as:  SILVADENE  Apply topically daily.     traMADol 50 MG tablet  Commonly known as:  ULTRAM  Take 1 tablet (50 mg total) by mouth every 6 (six) hours as needed for moderate pain.        Disposition and follow-up:   [name] was discharged from [facility] in Stable condition.  At the hospital follow up visit please address:  Observe stump for signs of infection  Change dressings every other day  He will need daily INR checks until therapeutic (goal INR between 2 to 3)  He will continue cephalexin 537m every 6 hours until February 5th   Consultations:  Treatment Team:  TRosetta Posner MD  Procedures Performed:  Mr Foot Left W Wo Contrast  11/10/2015  CLINICAL DATA:  Left lower extremity cellulitis in a diabetic patient. Low grade fever. Question ischemia of the left third toe. Initial encounter. EXAM: MRI OF THE LEFT FOREFOOT WITHOUT AND WITH CONTRAST TECHNIQUE: Multiplanar, multisequence MR imaging was performed both before and after administration of  intravenous contrast. CONTRAST:  18 mL MULTIHANCE GADOBENATE DIMEGLUMINE 529 MG/ML IV SOLN COMPARISON:  Plain films left foot 11/08/2015. FINDINGS: Diffuse subcutaneous edema is present about the foot. There is also diffuse soft tissue enhancement with the exception of the plantar soft tissues at the level of the second, third and fourth MTP joints and soft tissues about the third toe. Tissues in the distal most aspect of the second toe also do not enhance. A fluid collection in the subcutaneous plantar fat deep to the third metatarsal is centered approximately 2.8 cm proximal to the third MTP joint. The collection measures 1.7 cm transverse by 0.6 cm craniocaudal by 3.3 cm long and is worrisome for abscess. There is no bone marrow signal abnormality or enhancement to suggest osteomyelitis. Mild increased signal can the bones of the third toe is similar to  that seen in either osseous structures and most compatible with normal enhancement. No Marrow edema in the periphery of the lateral sesamoid is likely related to degenerative disease. IMPRESSION: Findings consistent with diffuse cellulitis of the left foot. Lack of enhancement of subcutaneous fat deep to the second, third and fourth MTP joints, about the third toe and distal most second toe is worrisome for absence of blood flow/ischemia. No finding to suggest osteonecrosis is seen. Rim enhancing fluid collection in subcutaneous fat deep to the third metatarsal is compatible with an abscess. Negative for osteomyelitis. Electronically Signed   By: Inge Rise M.D.   On: 11/10/2015 12:41   Mr Jodene Nam Ext Low Left Wo/w Cm  11/10/2015  CLINICAL DATA:  Right toe gangrene EXAM: MR MRA OF THE LEFT LOWER EXTREMITY WITHOUT AND WITH CONTRAST TECHNIQUE: Angiographic images of the chest were obtained using MRA technique without and with intravenous contrast. CONTRAST:  18 cc MultiHance COMPARISON:  None. FINDINGS: Imaging has been achieved from the knee joint to the ankles. Right leg: The distal right popliteal artery is patent. There is significant narrowing at the origin of the anterior tibial artery. It occludes 3 cm beyond its takeoff. There is mild disease involving the tibioperoneal trunk and origin of the peroneal artery. There is an area of moderate focal narrowing in the peroneal artery, 4 cm beyond its takeoff. It is patent to the ankle and reconstitutes the dorsalis pedis artery. There is late filling of the remainder of the anterior tibial artery. The posterior tibial artery is patent to the ankle. Left leg: The popliteal artery is patent below-the-knee. The proximal anterior tibial artery and tibioperoneal trunk are both occluded. The distal tibioperoneal trunk reconstitutes. The posterior tibial artery is patent to the ankle with a focal area of significant narrowing just above the ankle. The peroneal artery  is severely diseased. The anterior tibial artery somewhat reconstitutes distally. IMPRESSION: In the right lower extremity, the distal right popliteal artery is patent and there is 2 vessel runoff via the peroneal and posterior tibial arteries. There is some disease in the tibioperoneal trunk and peroneal artery. In the left lower extremity, the popliteal artery is patent below-the-knee, however there is no continuous vessel runoff below the knee. The anterior tibial artery is occluded. The tibioperoneal trunk is occluded but reconstitutes distally. The posterior tibial artery is patent beyond its origin. Electronically Signed   By: Marybelle Killings M.D.   On: 11/10/2015 13:13   Dg Chest Portable 1 View  11/10/2015  CLINICAL DATA:  Respiratory distress for 2 hours. EXAM: PORTABLE CHEST 1 VIEW COMPARISON:  None. FINDINGS: Patient is status post median sternotomy. Slight  discontinuity of the most superior sternotomy wire. Remainder of the sternotomy wires appear intact and normally aligned. There is cardiomegaly, at least moderate in degree. Patchy opacities are seen throughout both lungs, predominantly interstitial in appearance, suggesting edema. No large pleural effusion. No pneumothorax seen. No acute osseous abnormality seen. IMPRESSION: Cardiomegaly with presumed interstitial edema throughout both lungs suggesting congestive heart failure. Electronically Signed   By: Franki Cabot M.D.   On: 11/10/2015 20:50   Dg Foot Complete Left  11/08/2015  CLINICAL DATA:  Painful swollen left second toe. EXAM: LEFT FOOT - COMPLETE 3+ VIEW COMPARISON:  10/03/2005 FINDINGS: Postoperative changes involving the fourth metatarsal head with a single screw in place. Moderate degenerative changes involving the metatarsal phalangeal joints with moderate extension of the proximal phalanges in relation to the metatarsal heads. No acute bony findings or destructive bony changes. Extensive small vessel calcifications are noted. There  are large calcaneal heel spurs and extensive calcification along the Achilles tendon. IMPRESSION: No acute bony findings or destructive bony changes. Electronically Signed   By: Marijo Sanes M.D.   On: 11/08/2015 12:39    Admission HPI:  Mr. 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  Three days ago, while getting out of the shower, he noticed his left foot was more swollen than usual. That night, his left foot quickly became red, and the redness spread up his left leg. His foot became more and more painful, and he had chills and night sweats at night, until Sunday afternoon the pain completely abated he was watching NFL playoffs. Some time during the games, he noticed that his left second toe again turning black. Today, he went to his podiatrist who referred him to the emergency department. He denies any claudication symptoms and enjoys playing softball. In December, he got ABIs but could not get reliable calculation because his vessels were non-compressible. Prior to that, he had TBIs of 0.46 in the left and 0.76 in the right. He denies any headache, chest pain, abdominal pain; review of systems was otherwise non-revealing.  Hospital Course by problem list:   1. Left lower extremity necrotizing cellulitis: He presented with a three-day history of painful erythema on his left lower extremity that originally started on the dorsum of his foot. One day later, the erythema extended up his leg, and his second left toe started turning black. We suspected this was necrotizing cellulitis in the setting of severe peripheral vascular disease. An MRI showed an abscess on the dorsum of the left foot with necrotic metatarsals.  On hospital day 2, we attempted to reverse his INR with fresh frozen plasma and he went to the operating room where he underwent a third toe amputation. His INR was difficult to reverse despite vitamin K and FFP. We suspected he was going into DIC but his fibrinogen level was actually elevated. We then attempted to perform a PT-mixing study to look for a factor inhibitor, but the lab was unable to perform this for an unknown reason. After his toe amputation, he went into flash pulmonary edema and was transferred to the ICU. I suspect his pulmonary edema was related to his infection and less likely worsening heart failure, as a TTE showed a normal ejection  fraction of 55-60%. The following day, he underwent an above-knee amputation as the cellulitis had extended up his thigh in a matter of 24 hours. He tolerated the procedure well. Post-operatively, in the ICU, he was diuresed, successfully extubated on the third day, and returned to our service. His wound cultures grew MSSA. Source control was achieved with the amputation; he defervesced, his leukocytosis improved, and he was discharged on a 14 day course of cephalexin (stop date February 5th). Given the rapidity of progression, I suspect this was necrotizing cellulitis in the setting of known severe peripheral vascular disease.  2. Chronic atrial fibrillation on warfarin: We had a difficult time reversing his INR with vitamin K and FFP per above. We thought he may have been going into DIC but his fibrinogen level was actually elevated. Alternatively we considered he may have a factor inhibitor, either a lupus anticoagulant or factor 8 inhibitor. We attempted to perform a PT mixing study but this was never obtained. His INR eventually become subtherapeutic. On post-op day 4, he was  resumed on his warfarin 26m daily and this was continued upon discharge.  3. Hypertension: His pressures were quite erratic while admitted and were managed by holding  antihypertensives and giving parenteral antihypertensives while in the ICU. Upon discharge, his pressures were under 150/90 on his home regiment without the clonidine patch.  Discharge Vitals:   BP 119/62 mmHg  Pulse 47  Temp(Src) 97.5 F (36.4 C) (Oral)  Resp 18  Ht 5' 8"  (1.727 m)  Wt 79.2 kg (174 lb 9.7 oz)  BMI 26.55 kg/m2  SpO2 95%  Discharge Labs:  Results for orders placed or performed during the hospital encounter of 11/08/15 (from the past 24 hour(s))  Glucose, capillary     Status: Abnormal   Collection Time: 11/15/15 11:32 AM  Result Value Ref Range   Glucose-Capillary 119 (H) 65 - 99 mg/dL   Comment 1 Notify RN    Comment 2 Document in Chart   Protime-INR     Status: Abnormal   Collection Time: 11/15/15 12:24 PM  Result Value Ref Range   Prothrombin Time 20.0 (H) 11.6 - 15.2 seconds   INR 1.70 (H) 0.00 - 1.49  Glucose, capillary     Status: Abnormal   Collection Time: 11/15/15  4:46 PM  Result Value Ref Range   Glucose-Capillary 118 (H) 65 - 99 mg/dL   Comment 1 Notify RN    Comment 2 Document in Chart   Glucose, capillary     Status: Abnormal   Collection Time: 11/15/15  9:43 PM  Result Value Ref Range   Glucose-Capillary 120 (H) 65 - 99 mg/dL   Comment 1 Notify RN   Protime-INR     Status: Abnormal   Collection Time: 11/16/15  4:05 AM  Result Value Ref Range   Prothrombin Time 20.4 (H) 11.6 - 15.2 seconds   INR 1.75 (H) 0.00 - 1.49  CBC     Status: Abnormal   Collection Time: 11/16/15  4:05 AM  Result Value Ref Range   WBC 10.5 4.0 - 10.5 K/uL   RBC 3.71 (L) 4.22 - 5.81 MIL/uL   Hemoglobin 11.4 (L) 13.0 - 17.0 g/dL   HCT 35.0 (L) 39.0 - 52.0 %   MCV 94.3 78.0 - 100.0 fL   MCH 30.7 26.0 - 34.0 pg   MCHC 32.6 30.0 - 36.0 g/dL   RDW 14.2 11.5 - 15.5 %   Platelets 410 (H) 150 - 400 K/uL  Basic  metabolic panel     Status: Abnormal   Collection Time: 11/16/15  4:05 AM  Result Value Ref Range   Sodium 139 135 - 145 mmol/L   Potassium 3.7 3.5 - 5.1  mmol/L   Chloride 106 101 - 111 mmol/L   CO2 24 22 - 32 mmol/L   Glucose, Bld 128 (H) 65 - 99 mg/dL   BUN 35 (H) 6 - 20 mg/dL   Creatinine, Ser 1.04 0.61 - 1.24 mg/dL   Calcium 7.7 (L) 8.9 - 10.3 mg/dL   GFR calc non Af Amer >60 >60 mL/min   GFR calc Af Amer >60 >60 mL/min   Anion gap 9 5 - 15  Glucose, capillary     Status: Abnormal   Collection Time: 11/16/15  6:27 AM  Result Value Ref Range   Glucose-Capillary 111 (H) 65 - 99 mg/dL    Signed: Loleta Chance, MD 11/16/2015, 10:53 AM

## 2015-11-08 NOTE — ED Provider Notes (Signed)
CSN: [id number]     Arrival date & time 11/08/15  1019 History   First MD Initiated Contact with Patient 11/08/15 1051     Chief Complaint  Patient presents with  . Toe Pain     (Consider location/radiation/quality/duration/timing/severity/associated sxs/prior Treatment) HPI   [name] is a 80 y.o M with a pmhx of ASCVD, A.fib on coumadin, chronic peripheral neuropathy, HTN, DM, PVD, presents to the ED today c/o LLE redness and swelling. Pt states that 3 days ago he began having pain in his left 3rd toe. Pt states that the toe progressively began to change color and turned black. Pt has decreased sensation in his lower extremities at baseline, but now he no longer feels pain. Pt also noticed that below his left knee began to swell and turned bright red, warm to the touch. Pt has been having associated chills. Pt saw his podiatrist this morning who sent him to the ED to r/o clot. Denies fevers, dizziness, CP, SOB, N/V, syncope, headache.   Past Medical History  Diagnosis Date  . Arteriosclerotic cardiovascular disease (ASCVD)     CABG in 1990s; 2002 total obstruction of LAD, CX and RCA with patent grafts and nl EF; Stress nuc. 2008 - mild LV dilation; normal EF; questionable small anteroapical scar; no ischemia  . Atrial fibrillation ([place])   . Chronic anticoagulation   . Arthritis     knees  . Pedal edema     "not a lot", per pt.  . Neuropathy due to type 2 diabetes mellitus ([place])     bilateral lower leg, hands  . Hypertension     states under control with meds., has been on med. x "long time"  . Diet-controlled diabetes mellitus ([place])   . History of squamous cell carcinoma 1990s    left jaw  . Peripheral vascular disease ([place])   . 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 Cataracts, bilateral   . Dental crowns present   . Runny nose 07/27/2015    clear drainage, per pt.   Past Surgical History  Procedure Laterality Date  . Coronary artery bypass graft   1990's  . Squamous cell carcinoma excision Left 1993    jaw  . Tonsillectomy    . Orif tibia fracture Left   . Colonoscopy N/A 11/27/2014    Procedure: COLONOSCOPY;  Surgeon: Rogene Houston, MD;  Location: [facility];  Service: Endoscopy;  Laterality: N/A;  830  . Incision and drainage abscess Left 06/16/2005    wide exc. abscess 5th toe  . Cardiac catheterization  04/16/2001  . Mandibular hardware removal Left 08/02/2015    Procedure:  HARDWARE REMOVAL TWO MANDIBULAR SCREWS;  Surgeon: Jannette Fogo, DDS;  Location: [facility];  Service: Oral Surgery;  Laterality: Left;   Family History  Problem Relation Age of Onset  . Heart disease Mother    Social History  Substance Use Topics  . Smoking status: Never Smoker   . Smokeless tobacco: Never Used  . Alcohol Use: No    Review of Systems  All other systems reviewed and are negative.     Allergies  Lipitor; Simvastatin; Penicillins; and Sulfa antibiotics  Home Medications   Prior to Admission medications   Medication Sig Start Date End Date Taking? Authorizing Provider  amLODipine (NORVASC) 10 MG tablet Take 1 tablet (10 mg total) by mouth daily. 03/01/15  Yes Herminio Commons, MD  aspirin 81 MG tablet Take 81  mg by mouth daily.   Yes Historical Provider, MD  cloNIDine (CATAPRES - DOSED IN MG/24 HR) 0.3 mg/24hr patch APPLY 1 PATCH ONCE WEEKLY 03/24/15  Yes Herminio Commons, MD  Difluprednate (DUREZOL) 0.05 % EMUL Apply 1 drop to eye 2 (two) times daily.   Yes Historical Provider, MD  ferrous sulfate 325 (65 FE) MG tablet Take 65 mg by mouth daily with breakfast.   Yes Historical Provider, MD  fish oil-omega-3 fatty acids 1000 MG capsule Take 1 capsule by mouth daily.     Yes Historical Provider, MD  JANTOVEN 2.5 MG tablet TAKE 1 TABLET DAILY OR AS  DIRECTED BY COUMADIN CLINIC 02/09/15  Yes Herminio Commons, MD  losartan-hydrochlorothiazide (HYZAAR) 100-25 MG per tablet Take 1 tablet by mouth daily.     Yes  Historical Provider, MD  Multiple Vitamins-Minerals (CENTRUM SILVER PO) Take 1 tablet by mouth daily.     Yes Historical Provider, MD  nebivolol (BYSTOLIC) 5 MG tablet Take 1 tablet (5 mg total) by mouth daily. 05/28/15  Yes Herminio Commons, MD  Probiotic Product (Goldonna PO) Take 1 capsule by mouth daily.    Yes Historical Provider, MD   BP 129/59 mmHg  Pulse 78  Temp(Src) 99.2 F (37.3 C) (Oral)  Resp 18  SpO2 97% Physical Exam  Constitutional: He is oriented to person, place, and time. He appears well-developed and well-nourished. No distress.  HENT:  Head: Normocephalic and atraumatic.  Mouth/Throat: No oropharyngeal exudate.  Eyes: Conjunctivae and EOM are normal. Pupils are equal, round, and reactive to light. Right eye exhibits no discharge. Left eye exhibits no discharge. No scleral icterus.  Cardiovascular: Normal rate, regular rhythm, normal heart sounds and intact distal pulses.  Exam reveals no gallop and no friction rub.   No murmur heard. Pulmonary/Chest: Effort normal and breath sounds normal. No respiratory distress. He has no wheezes. He has no rales. He exhibits no tenderness.  Abdominal: Soft. He exhibits no distension. There is no tenderness. There is no guarding.  Musculoskeletal: Normal range of motion. He exhibits edema.  Severe erythema extending from below the left knee to the toes. Erythema is circumferential. Warm to the touch. 2+ pitting edema. Poor cap refill in L toes. L 3rd toe appears necrotic, black in color. Unable to obtain PT or DP pulses with doppler in LLE. L femoral pulse palpated. No ulcerations.   Neurological: He is alert and oriented to person, place, and time.  Strength 5/5 throughout. Decreased sensation in bilateral LE, worse on the left.   Skin: Skin is warm and dry. No rash noted. He is not diaphoretic. There is erythema. No pallor.  Psychiatric: He has a normal mood and affect. His behavior is normal.  Nursing note and vitals  reviewed.   ED Course  Procedures (including critical care time) Labs Review Labs Reviewed  CBC WITH DIFFERENTIAL/PLATELET - Abnormal; Notable for the following:    WBC 21.8 (*)    RBC 3.48 (*)    Hemoglobin 11.0 (*)    HCT 32.4 (*)    Neutro Abs 18.8 (*)    Monocytes Absolute 2.2 (*)    All other components within normal limits  COMPREHENSIVE METABOLIC PANEL - Abnormal; Notable for the following:    Potassium 2.9 (*)    Glucose, Bld 158 (*)    Creatinine, Ser 1.40 (*)    Calcium 7.1 (*)    Total Protein 5.7 (*)    Albumin 2.7 (*)  Total Bilirubin 1.8 (*)    GFR calc non Af Amer 47 (*)    GFR calc Af Amer 54 (*)    All other components within normal limits  PROTIME-INR - Abnormal; Notable for the following:    Prothrombin Time 29.2 (*)    INR 2.81 (*)    All other components within normal limits  I-STAT CG4 LACTIC ACID, ED - Abnormal; Notable for the following:    Lactic Acid, Venous 2.19 (*)    All other components within normal limits  CULTURE, BLOOD (ROUTINE X 2)  CULTURE, BLOOD (ROUTINE X 2)  LACTIC ACID, PLASMA    Imaging Review Dg Foot Complete Left  11/08/2015  CLINICAL DATA:  Painful swollen left second toe. EXAM: LEFT FOOT - COMPLETE 3+ VIEW COMPARISON:  10/03/2005 FINDINGS: Postoperative changes involving the fourth metatarsal head with a single screw in place. Moderate degenerative changes involving the metatarsal phalangeal joints with moderate extension of the proximal phalanges in relation to the metatarsal heads. No acute bony findings or destructive bony changes. Extensive small vessel calcifications are noted. There are large calcaneal heel spurs and extensive calcification along the Achilles tendon. IMPRESSION: No acute bony findings or destructive bony changes. Electronically Signed   By: Marijo Sanes M.D.   On: 11/08/2015 12:39   I have personally reviewed and evaluated these images and lab results as part of my medical decision-making.   EKG  Interpretation None      MDM   Final diagnoses:  Cellulitis of left lower extremity    80 y.o M with a pmhx of severe PVD, DM, HTN, A.fib on coumadin presents with LLE erythema and left 3rd toe pain and discoloration onset 3 days ago. Pt saw podiatry today who sent pt to the ER. LLE is extremely erythematous and warm to the touch, appears to be cellulitis. Erythema is circumferential extending from toes to below the left knee. Pt afebrile. All VSS. Left 3rd toe appears to be necrotic, black in appearance. Unable to obtain DP or PT pulses with doppler on LLE. Pt sees vascular surgery for severe PVD. Pt had undetectable ABIs performed in late Dec 2016 due to severe arteriocclusion. Pt initiated on vanc and aztreonam as pt is PCN allergic. Leukocytosis present, WBC 21.8. Lactic acid elevated at 2.19. Blood cultures collected. Pt does not meet sespsis criteria at this time. Potassium low at 2.9, repleted in ED. Pt given 1L fluids. No evidence of osteomyelitis on xray. Vascular surgery consulted pt in the ED. They plan to perform arteriogram tomorrow. Recommend admission for IV abx for cellulitis prior to arteriogram. Pt will also need INR reversed on the floor. Will hold coumadin.   Spoke with hospitalist who will admit pt to their service. Pt is currently hemodynamically stable awaiting floor bed.   Patient was discussed with and seen by Dr. Lita Mains who agrees with the treatment plan.      Dondra Spry [place], PA-C 11/10/15 2155  Julianne Rice, MD 11/12/15 [id number]

## 2015-11-08 NOTE — Consult Note (Signed)
[facility] [place]  Patient name: [name] MRN: [id number] DOB: 1936-05-15 Sex: male  REASON FOR CONSULT: left 3rd toe discoloration and left leg erythema  HPI: [name] is a 80 y.o. male, who presents to the [facility] ED from his podiatrist's office with three day history of progressive left 3rd toe darkening and 5 day history of left leg erythema and swelling. He reports having chills, but no fever. The patient reports one night that he noticed that his toe became darker. He denies any injury to this foot. He says that his foot has been painful and describes it as "electricity running through." The patient was last seen in the [facility] office on 10/06/15 by Dr. Scot Dock, where he had evidence of infrainguinal arterial occlusive disease on the left, but was essentially asymptomatic. He was scheduled for an 18 month follow-up after that. He denies any history on nonhealing wounds, significant claudication or rest pain. He has had a callus debrided on his left foot that is healing adequately.   He has a past medical history of CAD s/p CABG in the 1990s, atrial fibrillation on coumadin, diabetes mellitus with peripheral neuropathy and hypertension on multiple antihypertensives.   Past Medical History  Diagnosis Date  . Arteriosclerotic cardiovascular disease (ASCVD)     CABG in 1990s; 2002 total obstruction of LAD, CX and RCA with patent grafts and nl EF; Stress nuc. 2008 - mild LV dilation; normal EF; questionable small anteroapical scar; no ischemia  . Atrial fibrillation ([place])   . Chronic anticoagulation   . Arthritis     knees  . Pedal edema     "not a lot", per pt.  . Neuropathy due to type 2 diabetes mellitus ([place])     bilateral lower leg, hands  . Hypertension     states under control with meds., has been on med. x "long time"  . Diet-controlled diabetes mellitus ([place])   . History of squamous cell carcinoma 1990s    left jaw  . Peripheral vascular  disease ([place])   . 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 Cataracts, bilateral   . Dental crowns present   . Runny nose 07/27/2015    clear drainage, per pt.    Family History  Problem Relation Age of Onset  . Heart disease Mother     SOCIAL HISTORY: Social History   Social History  . Marital Status: Married    Spouse Name: N/A  . Number of Children: N/A  . Years of Education: N/A   Occupational History  . Not on file.   Social History Main Topics  . Smoking status: Never Smoker   . Smokeless tobacco: Never Used  . Alcohol Use: No  . Drug Use: No  . Sexual Activity: Not on file   Other Topics Concern  . Not on file   Social History Narrative    Allergies  Allergen Reactions  . Lipitor [Atorvastatin] Other (See Comments)    SEVERE HEADACHE  . Simvastatin Other (See Comments)    MUSCLE ACHES  . Penicillins Rash  . Sulfa Antibiotics Rash    Current Facility-Administered Medications  Medication Dose Route Frequency Provider Last Rate Last Dose  . aztreonam (AZACTAM) 1 g in dextrose 5 % 50 mL IVPB  1 g Intravenous 3 times per day Romona Curls, [facility]      . [START ON 11/09/2015] vancomycin (VANCOCIN) 1,500 mg in sodium chloride 0.9 %  500 mL IVPB  1,500 mg Intravenous Q24H Romona Curls, [facility]      . vancomycin (VANCOCIN) 1,750 mg in sodium chloride 0.9 % 500 mL IVPB  1,750 mg Intravenous Once Romona Curls, RPH 250 mL/hr at 11/08/15 1240 1,750 mg at 11/08/15 1240   Current Outpatient Prescriptions  Medication Sig Dispense Refill  . amLODipine (NORVASC) 10 MG tablet Take 1 tablet (10 mg total) by mouth daily. 90 tablet 3  . aspirin 81 MG tablet Take 81 mg by mouth daily.    . cloNIDine (CATAPRES - DOSED IN MG/24 HR) 0.3 mg/24hr patch APPLY 1 PATCH ONCE WEEKLY 12 patch 3  . Difluprednate (DUREZOL) 0.05 % EMUL Apply 1 drop to eye 2 (two) times daily.    . ferrous sulfate 325 (65 FE) MG tablet Take 65 mg by mouth daily with breakfast.    . fish  oil-omega-3 fatty acids 1000 MG capsule Take 1 capsule by mouth daily.      Marland Kitchen JANTOVEN 2.5 MG tablet TAKE 1 TABLET DAILY OR AS  DIRECTED BY COUMADIN CLINIC 90 tablet 3  . losartan-hydrochlorothiazide (HYZAAR) 100-25 MG per tablet Take 1 tablet by mouth daily.      . Multiple Vitamins-Minerals (CENTRUM SILVER PO) Take 1 tablet by mouth daily.      . nebivolol (BYSTOLIC) 5 MG tablet Take 1 tablet (5 mg total) by mouth daily. 90 tablet 3  . Probiotic Product (PHILLIPS COLON HEALTH PO) Take 1 capsule by mouth daily.       REVIEW OF SYSTEMS:  [X]  denotes positive finding, [ ]  denotes negative finding Cardiac  Comments:  Chest pain or chest pressure:    Shortness of breath upon exertion:    Short of breath when lying flat:    Irregular heart rhythm: x afib      Vascular    Pain in calf, thigh, or hip brought on by ambulation:    Pain in feet at night that wakes you up from your sleep:     Blood clot in your veins:    Leg swelling:  x       Pulmonary    Oxygen at home:    Productive cough:     Wheezing:         Neurologic    Sudden weakness in arms or legs:     Sudden numbness in arms or legs:     Sudden onset of difficulty speaking or slurred speech:    Temporary loss of vision in one eye:     Problems with dizziness:         Gastrointestinal    Blood in stool:     Vomited blood:         Genitourinary    Burning when urinating:     Blood in urine:        Psychiatric    Major depression:         Hematologic    Bleeding problems:    Problems with blood clotting too easily:        Skin    Rashes or ulcers: x       Constitutional    Fever or chills: x     PHYSICAL EXAM: Filed Vitals:   11/08/15 1026 11/08/15 1215 11/08/15 1300  BP: 129/59 126/65 126/56  Pulse: 78 78 85  Temp: 99.2 F (37.3 C)    TempSrc: Oral    Resp: 18 20 22   SpO2: 97% 100% 97%  GENERAL: The patient is a well-nourished male, in no acute distress. The vital signs are documented  above. CARDIAC: Irregularly irregular rhythm. No carotid bruits.  VASCULAR: Faintly palpable left femoral pulse. Palpable right femoral pulse. Non palpable popliteal and pedal pulses. Brisk monophasic left DP and PT doppler signals. Brisk right biphasic DP and PT doppler signals.  PULMONARY: There is good air exchange bilaterally without wheezing or rales. ABDOMEN: Soft and non-tender with normal pitched bowel sounds.  MUSCULOSKELETAL: Darkened left third toe with dark discoloration at plantar aspect of left 2nd and 3rd toes. No drainage seen. Left leg is erythematous and warm from below knee to distal foot. Left calf soft and nontender.  NEUROLOGIC: Diminished sensation to feet bilaterally. Motor sensation intact.  SKIN:  Callous on plantar aspect of right foot.  PSYCHIATRIC: The patient has a normal affect.   MEDICAL ISSUES: Left lower extremity cellulitis with darkening of left 3rd toe and base of left 2nd and 3rd toes.   The patient has reasonable, yet abnormal doppler flow to his left foot. Based on his doppler exam, he has evidence of infrainguinal arterial occlusive disease. Plan for abdominal aortogram with bilateral runoff and possible intervention tomorrow with Dr. Scot Dock for further evaluation of his anatomy. If the patient's disease is not amenable to percutaneous intervention with balloon angioplasty or stenting, he may require a bypass. Discussed that he is at risk for amputation of his left third and possibly 2nd toe. Also discussed that he may not require any intervention and just require observation. This will depend on his arteriogram results.   Will have the hospitalist service admit for IV antibiotics. He is on coumadin for atrial fibrillation. Hold coumadin. His INR will need to be reversed with INR <1.6.   Virgina Jock, PA-C [facility] [place]      I have examined the patient, reviewed and agree with above. Very pleasant gentleman  followed in our office with a lower extremity arterial insufficiency. Had been seen one month ago. That time he had no tissue loss and no evidence of ischemia. He presented to his podiatrist in [facility] with discoloration of third toe on his left foot. This has progressed to extensive cellulitis from his knee distally into his foot. Does have a very good sounding anterior tibial and posterior tibial Doppler flow at the level of the ankle. Will need his Coumadin reversed and will need arteriography to determine if any option for revascularization as possible. Discussed this at length with the patient, his wife and daughter present. Explained that this could be limb threatening but suspect with the degree of tissue loss that he has at this can be a limb salvage situation. Will plan arteriography tomorrow if INR is normalized  Curt Jews, MD 11/08/2015 5:52 PM

## 2015-11-09 ENCOUNTER — Encounter (HOSPITAL_COMMUNITY)
Admission: EM | Disposition: A | Discharge status: Skilled Nursing Facility | Payer: Self-pay | Source: Home / Self Care | Attending: Pulmonary Disease

## 2015-11-09 ENCOUNTER — Ambulatory Visit (HOSPITAL_COMMUNITY): Payer: Medicare Other

## 2015-11-09 DIAGNOSIS — E1142 Type 2 diabetes mellitus with diabetic polyneuropathy: Secondary | ICD-10-CM

## 2015-11-09 DIAGNOSIS — M7989 Other specified soft tissue disorders: Secondary | ICD-10-CM

## 2015-11-09 DIAGNOSIS — M79609 Pain in unspecified limb: Secondary | ICD-10-CM

## 2015-11-09 DIAGNOSIS — B9689 Other specified bacterial agents as the cause of diseases classified elsewhere: Secondary | ICD-10-CM

## 2015-11-09 DIAGNOSIS — I2510 Atherosclerotic heart disease of native coronary artery without angina pectoris: Secondary | ICD-10-CM

## 2015-11-09 DIAGNOSIS — R509 Fever, unspecified: Secondary | ICD-10-CM

## 2015-11-09 DIAGNOSIS — L8990 Pressure ulcer of unspecified site, unspecified stage: Secondary | ICD-10-CM | POA: Insufficient documentation

## 2015-11-09 DIAGNOSIS — N289 Disorder of kidney and ureter, unspecified: Secondary | ICD-10-CM

## 2015-11-09 DIAGNOSIS — Z7984 Long term (current) use of oral hypoglycemic drugs: Secondary | ICD-10-CM

## 2015-11-09 DIAGNOSIS — Z85828 Personal history of other malignant neoplasm of skin: Secondary | ICD-10-CM

## 2015-11-09 DIAGNOSIS — Z7901 Long term (current) use of anticoagulants: Secondary | ICD-10-CM

## 2015-11-09 DIAGNOSIS — Z955 Presence of coronary angioplasty implant and graft: Secondary | ICD-10-CM

## 2015-11-09 LAB — CBC
HCT: 30.1 % — ABNORMAL LOW (ref 39.0–52.0)
HEMOGLOBIN: 10.3 g/dL — AB (ref 13.0–17.0)
MCH: 32.1 pg (ref 26.0–34.0)
MCHC: 34.2 g/dL (ref 30.0–36.0)
MCV: 93.8 fL (ref 78.0–100.0)
PLATELETS: 211 10*3/uL (ref 150–400)
RBC: 3.21 MIL/uL — ABNORMAL LOW (ref 4.22–5.81)
RDW: 14.3 % (ref 11.5–15.5)
WBC: 18.3 10*3/uL — ABNORMAL HIGH (ref 4.0–10.5)

## 2015-11-09 LAB — COMPREHENSIVE METABOLIC PANEL
ALT: 23 U/L (ref 17–63)
AST: 28 U/L (ref 15–41)
Albumin: 2.4 g/dL — ABNORMAL LOW (ref 3.5–5.0)
Alkaline Phosphatase: 110 U/L (ref 38–126)
Anion gap: 12 (ref 5–15)
BILIRUBIN TOTAL: 1.9 mg/dL — AB (ref 0.3–1.2)
BUN: 25 mg/dL — AB (ref 6–20)
CO2: 23 mmol/L (ref 22–32)
Calcium: 6.8 mg/dL — ABNORMAL LOW (ref 8.9–10.3)
Chloride: 108 mmol/L (ref 101–111)
Creatinine, Ser: 1.44 mg/dL — ABNORMAL HIGH (ref 0.61–1.24)
GFR calc Af Amer: 53 mL/min — ABNORMAL LOW (ref >60)
GFR, EST NON AFRICAN AMERICAN: 45 mL/min — AB (ref >60)
Glucose, Bld: 117 mg/dL — ABNORMAL HIGH (ref 65–99)
POTASSIUM: 3.5 mmol/L (ref 3.5–5.1)
Sodium: 143 mmol/L (ref 135–145)
TOTAL PROTEIN: 5.4 g/dL — AB (ref 6.5–8.1)

## 2015-11-09 LAB — GLUCOSE, CAPILLARY
GLUCOSE-CAPILLARY: 125 mg/dL — AB (ref 65–99)
GLUCOSE-CAPILLARY: 135 mg/dL — AB (ref 65–99)
Glucose-Capillary: 124 mg/dL — ABNORMAL HIGH (ref 65–99)
Glucose-Capillary: 155 mg/dL — ABNORMAL HIGH (ref 65–99)

## 2015-11-09 LAB — PROTIME-INR
INR: 2.69 — ABNORMAL HIGH (ref 0.00–1.49)
PROTHROMBIN TIME: 28.2 s — AB (ref 11.6–15.2)

## 2015-11-09 LAB — C-REACTIVE PROTEIN: CRP: 19.9 mg/dL — AB (ref <1.0)

## 2015-11-09 SURGERY — ABDOMINAL AORTOGRAM W/LOWER EXTREMITY

## 2015-11-09 MED ORDER — SODIUM CHLORIDE 0.9 % IV SOLN
INTRAVENOUS | Status: AC
  Administered 2015-11-09: 15:00:00 via INTRAVENOUS

## 2015-11-09 MED ORDER — ACETAMINOPHEN 325 MG PO TABS
650.0000 mg | ORAL_TABLET | Freq: Four times a day (QID) | ORAL | Status: DC | PRN
  Administered 2015-11-09: 650 mg via ORAL
  Filled 2015-11-09: qty 2

## 2015-11-09 MED ORDER — DEXTROSE 5 % IV SOLN
2.0000 mg | Freq: Once | INTRAVENOUS | Status: AC
  Administered 2015-11-09: 2 mg via INTRAVENOUS
  Filled 2015-11-09 (×2): qty 0.2

## 2015-11-09 NOTE — Clinical Documentation Improvement (Signed)
Internal Medicine  Can the site of patient's Stage 2 pressure ulcer be further specified? Please document findings in next progress note; NOT in BPA drop down box. Thanks!    Document Site with laterality - Elbow, Back (upper/lower), Sacral, Hip, Buttock, Ankle, Heel, Head, Other (Specify)  Other  Clinically Undetermined  Please exercise your independent, professional judgment when responding. A specific answer is not anticipated or expected.  Thank You,  Zoila Shutter RN, BSN, [place] [phone]

## 2015-11-09 NOTE — Progress Notes (Signed)
*  PRELIMINARY RESULTS* Vascular Ultrasound Left lower extremity venous duplex has been completed.  Preliminary findings: No evidence of DVT or baker's cyst. Enlarged left inguinal lymph node.   Landry Mellow, RDMS, RVT  11/09/2015, 3:08 PM

## 2015-11-09 NOTE — Progress Notes (Signed)
   VASCULAR SURGERY ASSESSMENT & PLAN:  * INR today is 2.7. Therefore, his arteriogram cannot be done safely. I have ordered 2 mg of vitamin K IV and we will try to reschedule for tomorrow. I will make further recommendations pending the results of his arteriogram. If he requires a bypass, I could potentially do it Thursday or Friday.  *  Continue IV antibiotics. (Aztreonam and Vancomycin)    SUBJECTIVE: No complaints. He wants to eat.   PHYSICAL EXAM: Filed Vitals:   11/08/15 1600 11/08/15 1708 11/08/15 1827 11/09/15 0627  BP: 119/51  124/63 120/56  Pulse: 79  86 80  Temp:  102.5 F (39.2 C) 98.9 F (37.2 C) 98.7 F (37.1 C)  TempSrc:  Rectal Oral Oral  Resp: 29  22 20   Height:   5' 8"  (1.727 m)   Weight:   196 lb 10.4 oz (89.2 kg)   SpO2: 97%  100% 99%   Cellulitis left leg unchanged.  Ischemic left third toe unchanged.  LABS: Lab Results  Component Value Date   WBC 18.3* 11/09/2015   HGB 10.3* 11/09/2015   HCT 30.1* 11/09/2015   MCV 93.8 11/09/2015   PLT 211 11/09/2015   Lab Results  Component Value Date   CREATININE 1.44* 11/09/2015   Lab Results  Component Value Date   INR 2.69* 11/09/2015   CBG (last 3)   Recent Labs  11/08/15 2309 11/09/15 0656  GLUCAP 123* 124*    Principal Problem:   Lower extremity cellulitis Active Problems:   Diabetes mellitus ([place])   Dyslipidemia   Essential hypertension   Permanent atrial fibrillation (HCC)   Hx of CABG   Chronic anticoagulation   PVD (peripheral vascular disease) with claudication (H[facility])   Necrotic toes (H[facility])   Pressure ulcer  [ this is a patient that I last saw in the office on 10/06/2015 for follow up of his peripheral vascular disease. At that time, he was essentially asymptomatic. He had monophasic Doppler signals in the left foot and ABIs could not be obtained as the vessels were calcified]  Gae Gallop Beeper: [phone] 11/09/2015

## 2015-11-09 NOTE — Progress Notes (Signed)
Ordered Vit. K for patient at 1108 still has not yet received medication. i have just called to ask for medication again [name] 1:30 PM

## 2015-11-09 NOTE — Progress Notes (Signed)
Patient ID: [name], male   DOB: [birth year], 80 y.o.   MRN: [id number]   Subjective: [name] is feeling about the same this morning. He has intermittent twinges of pain in his left foot. He spiked a fever of 102.5 overnight and had some night sweats.  Objective: Vital signs in last 24 hours: Filed Vitals:   11/08/15 1600 11/08/15 1708 11/08/15 1827 11/09/15 0627  BP: 119/51  124/63 120/56  Pulse: 79  86 80  Temp:  102.5 F (39.2 C) 98.9 F (37.2 C) 98.7 F (37.1 C)  TempSrc:  Rectal Oral Oral  Resp: 29  22 20   Height:   5' 8"  (1.727 m)   Weight:   89.2 kg (196 lb 10.4 oz)   SpO2: 97%  100% 99%   Physical Exam: General: well-appearing friendly man resting in bed comfortably, appropriately conversational Cardiac: irregular rhythm, no rubs, murmurs or gallops Pulm: breathing well, clear to auscultation bilaterally  Ext: His left leg has very hot poorly-defined erythematous plaques extending up to 10cm below the knee with cutaneous anesthesia and surrounding edema. The left second toe is black with peripheral petechiae and crepitus. He has no sensation from the mid-shin down. I couldn't palpate dorsalis pedal pulses on the left. On the right, there are hyperpigmented plaques from the ankle to the mid-shin; he has sensation from the ankle upward. DPs are 1+ on the right.  Lab Results: Basic Metabolic Panel:  Recent Labs Lab 11/08/15 1032 11/09/15 0403  NA 139 143  K 2.9* 3.5  CL 104 108  CO2 24 23  GLUCOSE 158* 117*  BUN 20 25*  CREATININE 1.40* 1.44*  CALCIUM 7.1* 6.8*   Liver Function Tests:  Recent Labs Lab 11/08/15 1032 11/09/15 0403  AST 23 28  ALT 23 23  ALKPHOS 96 110  BILITOT 1.8* 1.9*  PROT 5.7* 5.4*  ALBUMIN 2.7* 2.4*   CBC:  Recent Labs Lab 11/08/15 1032 11/09/15 0403  WBC 21.8* 18.3*  NEUTROABS 18.8*  --   HGB 11.0* 10.3*  HCT 32.4* 30.1*  MCV 93.1 93.8  PLT 221 211   Coagulation:  Recent Labs Lab 11/08/15 1032 11/09/15 0403    LABPROT 29.2* 28.2*  INR 2.81* 2.69*   Medications: I have reviewed the patient's current medications. Scheduled Meds: . aztreonam  1 g Intravenous 3 times per day  . insulin aspart  0-5 Units Subcutaneous QHS  . insulin aspart  0-9 Units Subcutaneous TID WC  . nebivolol  5 mg Oral Daily  . phytonadione (VITAMIN K) IV  2 mg Intravenous Once  . sodium chloride  3 mL Intravenous Q12H  . vancomycin  1,500 mg Intravenous Q24H   Continuous Infusions: . sodium chloride 75 mL/hr at 11/08/15 2311   PRN Meds:.  Assessment/Plan:  [name] is a 80 year old man with peripheral vascular disease here with left lower extremity cellulitis with questionable necrosis of his left second toe. Dr. Doren Custard was planning on an arteriogram today, but his INR is still supratherapeutic and he still has an acute kidney injury so he will hopefully go tomorrow. Today, we will continue the vancomycin and aztreonam; although this is technically non-purulent cellulitis, the rapid progression concerns me this could be MRSA.  Hot erythematous left lower extremity: I'm beginning to wonder if this is cellulitis transforming into necrotizing fasciitis with or without arterial insufficiency. -Continue vancomycin and aztreonam -Vascular surgery to abdominal arteriogram tomorrow -CBC tomorrow  Atrial fibrillation: Rates well controlled. Reversing INR with vitamin K. -  Vitamin K 2 mg given today -Holding warfarin -Continue nebivolol 5 mg daily  Acute kidney injury: Creatinine 1.4 again today; his baseline is less than 1. I suspect this is pre-renal so I'll increase his fluids today. -Re-hydrating with normal saline at 100cc/hr -BMP tomorrow  Hypertension: Pressures well-controlled on home regiment. -Continue amlodipine 10 mg daily -Continue clonidine patch 0.3 mg daily -Continue losartan 100 mg daily -Continue hydrochlorothiazide 25 mg daily -Continue nebivolol 5 mg daily  Pre-diabetes: Last A1c 6.6 in 2010. This  is diet-controlled. -Checking A1c  Dispo: Disposition is deferred at this time, awaiting improvement of current medical problems.  Anticipated discharge in approximately 0-1 day(s).   The patient does have a current PCP Lemmie Evens, MD) and does need an [facility] hospital follow-up appointment after discharge.  The patient does not know have transportation limitations that hinder transportation to clinic appointments.  .Services Needed at time of discharge: Y = Yes, Blank = No PT:   OT:   RN:   Equipment:   Other:     LOS: 1 day   Loleta Chance, MD 11/09/2015, 10:22 AM

## 2015-11-09 NOTE — Progress Notes (Signed)
UR Completed. Zoltan Genest, RN, BSN.  [phone] 

## 2015-11-10 ENCOUNTER — Inpatient Hospital Stay (HOSPITAL_COMMUNITY): Payer: Medicare Other

## 2015-11-10 ENCOUNTER — Inpatient Hospital Stay (HOSPITAL_COMMUNITY): Payer: Medicare Other | Admitting: Anesthesiology

## 2015-11-10 ENCOUNTER — Ambulatory Visit (HOSPITAL_COMMUNITY): Admission: RE | Admit: 2015-11-10 | Payer: Medicare Other | Source: Ambulatory Visit | Admitting: Surgery

## 2015-11-10 ENCOUNTER — Encounter (HOSPITAL_COMMUNITY): Payer: Self-pay | Admitting: Anesthesiology

## 2015-11-10 ENCOUNTER — Encounter (HOSPITAL_COMMUNITY)
Admission: EM | Disposition: A | Discharge status: Skilled Nursing Facility | Payer: Self-pay | Source: Home / Self Care | Attending: Pulmonary Disease

## 2015-11-10 DIAGNOSIS — J810 Acute pulmonary edema: Secondary | ICD-10-CM

## 2015-11-10 DIAGNOSIS — E1149 Type 2 diabetes mellitus with other diabetic neurological complication: Secondary | ICD-10-CM

## 2015-11-10 DIAGNOSIS — Z9911 Dependence on respirator [ventilator] status: Secondary | ICD-10-CM

## 2015-11-10 DIAGNOSIS — B9561 Methicillin susceptible Staphylococcus aureus infection as the cause of diseases classified elsewhere: Secondary | ICD-10-CM

## 2015-11-10 DIAGNOSIS — J9600 Acute respiratory failure, unspecified whether with hypoxia or hypercapnia: Secondary | ICD-10-CM

## 2015-11-10 DIAGNOSIS — L03116 Cellulitis of left lower limb: Secondary | ICD-10-CM

## 2015-11-10 DIAGNOSIS — J9601 Acute respiratory failure with hypoxia: Secondary | ICD-10-CM

## 2015-11-10 DIAGNOSIS — L02612 Cutaneous abscess of left foot: Secondary | ICD-10-CM

## 2015-11-10 DIAGNOSIS — I509 Heart failure, unspecified: Secondary | ICD-10-CM

## 2015-11-10 HISTORY — PX: I & D EXTREMITY: SHX5045

## 2015-11-10 LAB — CBC
HCT: 28.4 % — ABNORMAL LOW (ref 39.0–52.0)
HEMOGLOBIN: 9.8 g/dL — AB (ref 13.0–17.0)
MCH: 32 pg (ref 26.0–34.0)
MCHC: 34.5 g/dL (ref 30.0–36.0)
MCV: 92.8 fL (ref 78.0–100.0)
PLATELETS: 222 10*3/uL (ref 150–400)
RBC: 3.06 MIL/uL — AB (ref 4.22–5.81)
RDW: 14.2 % (ref 11.5–15.5)
WBC: 22.1 10*3/uL — AB (ref 4.0–10.5)

## 2015-11-10 LAB — BASIC METABOLIC PANEL
Anion gap: 12 (ref 5–15)
BUN: 26 mg/dL — ABNORMAL HIGH (ref 6–20)
CALCIUM: 6.4 mg/dL — AB (ref 8.9–10.3)
CHLORIDE: 109 mmol/L (ref 101–111)
CO2: 21 mmol/L — AB (ref 22–32)
CREATININE: 1.37 mg/dL — AB (ref 0.61–1.24)
GFR calc Af Amer: 56 mL/min — ABNORMAL LOW (ref >60)
GFR calc non Af Amer: 48 mL/min — ABNORMAL LOW (ref >60)
GLUCOSE: 126 mg/dL — AB (ref 65–99)
Potassium: 2.8 mmol/L — ABNORMAL LOW (ref 3.5–5.1)
Sodium: 142 mmol/L (ref 135–145)

## 2015-11-10 LAB — PROTIME-INR
INR: 2.34 — AB (ref 0.00–1.49)
INR: 2.2 — ABNORMAL HIGH (ref 0.00–1.49)
PROTHROMBIN TIME: 25.4 s — AB (ref 11.6–15.2)
Prothrombin Time: 24.3 seconds — ABNORMAL HIGH (ref 11.6–15.2)

## 2015-11-10 LAB — GLUCOSE, CAPILLARY
GLUCOSE-CAPILLARY: 115 mg/dL — AB (ref 65–99)
GLUCOSE-CAPILLARY: 95 mg/dL (ref 65–99)
GLUCOSE-CAPILLARY: 103 mg/dL — AB (ref 65–99)
Glucose-Capillary: 135 mg/dL — ABNORMAL HIGH (ref 65–99)

## 2015-11-10 LAB — MAGNESIUM: Magnesium: 1.2 mg/dL — ABNORMAL LOW (ref 1.7–2.4)

## 2015-11-10 LAB — LACTATE DEHYDROGENASE: LDH: 230 U/L — AB (ref 98–192)

## 2015-11-10 LAB — TYPE AND SCREEN
ABO/RH(D): A POS
Antibody Screen: NEGATIVE

## 2015-11-10 LAB — HEMOGLOBIN A1C
HEMOGLOBIN A1C: 5.7 % — AB (ref 4.8–5.6)
MEAN PLASMA GLUCOSE: 117 mg/dL

## 2015-11-10 LAB — LACTIC ACID, PLASMA: Lactic Acid, Venous: 1.9 mmol/L (ref 0.5–2.0)

## 2015-11-10 LAB — ALBUMIN: Albumin: 2.2 g/dL — ABNORMAL LOW (ref 3.5–5.0)

## 2015-11-10 LAB — ABO/RH: ABO/RH(D): A POS

## 2015-11-10 SURGERY — IRRIGATION AND DEBRIDEMENT EXTREMITY
Anesthesia: General | Site: Toe | Laterality: Left

## 2015-11-10 SURGERY — Surgical Case
Anesthesia: *Unknown

## 2015-11-10 MED ORDER — ROCURONIUM BROMIDE 100 MG/10ML IV SOLN
INTRAVENOUS | Status: DC | PRN
  Administered 2015-11-10 (×2): 50 mg via INTRAVENOUS

## 2015-11-10 MED ORDER — 0.9 % SODIUM CHLORIDE (POUR BTL) OPTIME
TOPICAL | Status: DC | PRN
  Administered 2015-11-10: 1000 mL

## 2015-11-10 MED ORDER — VITAMIN K1 10 MG/ML IJ SOLN
2.0000 mg | Freq: Once | INTRAVENOUS | Status: AC
  Administered 2015-11-10: 2 mg via INTRAVENOUS
  Filled 2015-11-10: qty 0.2

## 2015-11-10 MED ORDER — POTASSIUM CHLORIDE 10 MEQ/100ML IV SOLN
INTRAVENOUS | Status: DC | PRN
  Administered 2015-11-10: 10 meq via INTRAVENOUS

## 2015-11-10 MED ORDER — POTASSIUM CHLORIDE 10 MEQ/50ML IV SOLN
10.0000 meq | INTRAVENOUS | Status: AC
  Filled 2015-11-10: qty 50

## 2015-11-10 MED ORDER — ANTISEPTIC ORAL RINSE SOLUTION (CORINZ)
7.0000 mL | Freq: Four times a day (QID) | OROMUCOSAL | Status: DC
  Administered 2015-11-11 – 2015-11-14 (×14): 7 mL via OROMUCOSAL

## 2015-11-10 MED ORDER — SUCCINYLCHOLINE CHLORIDE 20 MG/ML IJ SOLN
INTRAMUSCULAR | Status: DC | PRN
  Administered 2015-11-10: 100 mg via INTRAVENOUS

## 2015-11-10 MED ORDER — PANTOPRAZOLE SODIUM 40 MG IV SOLR: 40.0000 mg | Freq: Every day | INTRAVENOUS | Status: DC

## 2015-11-10 MED ORDER — CHLORHEXIDINE GLUCONATE 0.12% ORAL RINSE (MEDLINE KIT)
15.0000 mL | Freq: Two times a day (BID) | OROMUCOSAL | Status: DC
  Administered 2015-11-11 – 2015-11-14 (×8): 15 mL via OROMUCOSAL

## 2015-11-10 MED ORDER — FENTANYL CITRATE (PF) 100 MCG/2ML IJ SOLN
INTRAMUSCULAR | Status: AC
  Administered 2015-11-11: 100 ug
  Filled 2015-11-10: qty 2

## 2015-11-10 MED ORDER — POTASSIUM CHLORIDE CRYS ER 20 MEQ PO TBCR
40.0000 meq | EXTENDED_RELEASE_TABLET | Freq: Once | ORAL | Status: AC
Start: 2015-11-10 — End: 2015-11-10
  Administered 2015-11-10: 40 meq via ORAL
  Filled 2015-11-10: qty 2

## 2015-11-10 MED ORDER — SODIUM CHLORIDE 0.9 % IV SOLN
1.0000 g | Freq: Once | INTRAVENOUS | Status: AC
  Administered 2015-11-10: 1 g via INTRAVENOUS
  Filled 2015-11-10: qty 10

## 2015-11-10 MED ORDER — ARTIFICIAL TEARS OP OINT
TOPICAL_OINTMENT | OPHTHALMIC | Status: DC | PRN
  Administered 2015-11-10: 1 via OPHTHALMIC

## 2015-11-10 MED ORDER — ETOMIDATE 2 MG/ML IV SOLN
INTRAVENOUS | Status: DC | PRN
  Administered 2015-11-10: 14 mg via INTRAVENOUS

## 2015-11-10 MED ORDER — MAGNESIUM SULFATE 50 % IJ SOLN
2.0000 g | INTRAMUSCULAR | Status: DC | PRN
  Administered 2015-11-10: 2 g via INTRAVENOUS

## 2015-11-10 MED ORDER — FENTANYL CITRATE (PF) 250 MCG/5ML IJ SOLN
INTRAMUSCULAR | Status: AC
  Filled 2015-11-10: qty 5

## 2015-11-10 MED ORDER — GADOBENATE DIMEGLUMINE 529 MG/ML IV SOLN
20.0000 mL | Freq: Once | INTRAVENOUS | Status: AC
  Administered 2015-11-10: 18 mL via INTRAVENOUS

## 2015-11-10 MED ORDER — METOPROLOL TARTRATE 1 MG/ML IV SOLN
5.0000 mg | Freq: Four times a day (QID) | INTRAVENOUS | Status: DC
  Administered 2015-11-11 – 2015-11-15 (×15): 5 mg via INTRAVENOUS
  Filled 2015-11-10 (×14): qty 5

## 2015-11-10 MED ORDER — LACTATED RINGERS IV SOLN
INTRAVENOUS | Status: DC | PRN
  Administered 2015-11-10: 21:00:00 via INTRAVENOUS

## 2015-11-10 MED ORDER — SODIUM CHLORIDE 0.9 % IV SOLN
500.0000 mg | Freq: Three times a day (TID) | INTRAVENOUS | Status: DC
  Administered 2015-11-10 – 2015-11-13 (×7): 500 mg via INTRAVENOUS
  Filled 2015-11-10 (×12): qty 500

## 2015-11-10 MED ORDER — MAGNESIUM SULFATE 2 GM/50ML IV SOLN
2.0000 g | Freq: Once | INTRAVENOUS | Status: AC
  Administered 2015-11-10: 2 g via INTRAVENOUS
  Filled 2015-11-10: qty 50

## 2015-11-10 MED ORDER — FENTANYL CITRATE (PF) 100 MCG/2ML IJ SOLN
100.0000 ug | Freq: Once | INTRAMUSCULAR | Status: AC
  Administered 2015-11-10: 100 ug via INTRAVENOUS

## 2015-11-10 MED ORDER — NOREPINEPHRINE BITARTRATE 1 MG/ML IV SOLN
0.0000 ug/min | INTRAVENOUS | Status: DC
  Filled 2015-11-10: qty 4

## 2015-11-10 MED ORDER — MAGNESIUM SULFATE 2 GM/50ML IV SOLN
2.0000 g | INTRAVENOUS | Status: AC
  Filled 2015-11-10 (×2): qty 50

## 2015-11-10 MED ORDER — CLINDAMYCIN PHOSPHATE 900 MG/50ML IV SOLN
900.0000 mg | Freq: Three times a day (TID) | INTRAVENOUS | Status: DC
  Administered 2015-11-10 – 2015-11-11 (×2): 900 mg via INTRAVENOUS
  Filled 2015-11-10 (×5): qty 50

## 2015-11-10 MED ORDER — POTASSIUM CHLORIDE 10 MEQ/100ML IV SOLN
10.0000 meq | INTRAVENOUS | Status: AC
  Administered 2015-11-10 (×2): 10 meq via INTRAVENOUS
  Filled 2015-11-10 (×2): qty 100

## 2015-11-10 MED ORDER — VASOPRESSIN 20 UNIT/ML IV SOLN
0.0300 [IU]/min | INTRAVENOUS | Status: DC
  Filled 2015-11-10: qty 2

## 2015-11-10 MED ORDER — FENTANYL CITRATE (PF) 100 MCG/2ML IJ SOLN
INTRAMUSCULAR | Status: DC | PRN
  Administered 2015-11-10: 100 ug via INTRAVENOUS
  Administered 2015-11-10: 50 ug via INTRAVENOUS
  Administered 2015-11-10: 100 ug via INTRAVENOUS

## 2015-11-10 MED ORDER — SODIUM CHLORIDE 0.9 % IV SOLN
Freq: Once | INTRAVENOUS | Status: AC
  Administered 2015-11-10: 21:00:00 via INTRAVENOUS

## 2015-11-10 SURGICAL SUPPLY — 28 items
BANDAGE ELASTIC 4 VELCRO ST LF (GAUZE/BANDAGES/DRESSINGS) ×3 IMPLANT
BLADE LONG MED 31MMX9MM (MISCELLANEOUS) ×1
BLADE LONG MED 31X9 (MISCELLANEOUS) ×2 IMPLANT
BNDG CONFORM 3 STRL LF (GAUZE/BANDAGES/DRESSINGS) ×3 IMPLANT
BNDG GAUZE ELAST 4 BULKY (GAUZE/BANDAGES/DRESSINGS) ×5 IMPLANT
CANISTER SUCTION 2500CC (MISCELLANEOUS) ×3 IMPLANT
COVER SURGICAL LIGHT HANDLE (MISCELLANEOUS) ×6 IMPLANT
DRAPE EXTREMITY T 121X128X90 (DRAPE) ×3 IMPLANT
DRSG EMULSION OIL 3X3 NADH (GAUZE/BANDAGES/DRESSINGS) ×3 IMPLANT
DRSG PAD ABDOMINAL 8X10 ST (GAUZE/BANDAGES/DRESSINGS) ×2 IMPLANT
ELECT REM PT RETURN 9FT ADLT (ELECTROSURGICAL) ×3
ELECTRODE REM PT RTRN 9FT ADLT (ELECTROSURGICAL) ×1 IMPLANT
GAUZE SPONGE 4X4 12PLY STRL (GAUZE/BANDAGES/DRESSINGS) ×3 IMPLANT
GLOVE BIO SURGEON STRL SZ7.5 (GLOVE) ×3 IMPLANT
GLOVE BIOGEL PI IND STRL 8 (GLOVE) ×1 IMPLANT
GLOVE BIOGEL PI INDICATOR 8 (GLOVE) ×2
GOWN STRL REUS W/ TWL LRG LVL3 (GOWN DISPOSABLE) ×3 IMPLANT
GOWN STRL REUS W/TWL LRG LVL3 (GOWN DISPOSABLE) ×9
KIT BASIN OR (CUSTOM PROCEDURE TRAY) ×3 IMPLANT
KIT ROOM TURNOVER OR (KITS) ×3 IMPLANT
NS IRRIG 1000ML POUR BTL (IV SOLUTION) ×3 IMPLANT
PACK GENERAL/GYN (CUSTOM PROCEDURE TRAY) ×3 IMPLANT
PAD ARMBOARD 7.5X6 YLW CONV (MISCELLANEOUS) ×6 IMPLANT
SUT ETHILON 3 0 PS 1 (SUTURE) ×3 IMPLANT
SWAB COLLECTION DEVICE MRSA (MISCELLANEOUS) ×2 IMPLANT
TUBE ANAEROBIC SPECIMEN COL (MISCELLANEOUS) ×2 IMPLANT
UNDERPAD 30X30 INCONTINENT (UNDERPADS AND DIAPERS) ×3 IMPLANT
WATER STERILE IRR 1000ML POUR (IV SOLUTION) ×3 IMPLANT

## 2015-11-10 NOTE — Consult Note (Signed)
Name: [name] MRN: [id number] DOB: [birth year]    LOS: 3  Referring Provider:  Dr. 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 He has apparently done well during hospital stay, saturating well on room air, normotensive, with rate controlled Afib. In anticipation of angiography and surgery, his warfarin was reversed with Vitamin K. On 1/18, he was taken to the OR for amputation of the third left toe with I&D. In the OR, patient became tachypneic with RR in 30s, desatting to mid-70s, and tachycardic to rate of 120s. He required urgent intubation for hypoxemia and pulmonary edema. Afterwards he was transferred to the surgical ICU. He does not have documented history of CHF and most recent echo on file from 2006 shows EF of 45-50%. CXR shows cardiomegaly and bilateral opacities suggestive of pulmonary edema. PCCM was consulted for vent management.   Past Medical History  Diagnosis Date  . Arteriosclerotic cardiovascular disease (ASCVD)     CABG in 1990s; 2002 total obstruction of LAD, CX and RCA with patent grafts and nl EF; Stress nuc. 2008 - mild LV dilation; normal EF; questionable small anteroapical scar; no ischemia  . Atrial fibrillation ([place])   . Chronic anticoagulation   . Arthritis     "left knee" (11/08/2015)  . Pedal edema     "not a lot", per pt.  . Diabetic peripheral neuropathy (HCC)     bilateral lower leg, hands  . Hypertension     states under control with meds., has been on med. x "long time"  . Peripheral vascular disease ([place])   . 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 Dental crowns present   . Runny nose  07/27/2015    clear drainage, per pt.  . Myocardial infarction ([place]) 1990s    "after my jaw OR"  . Type II diabetes mellitus ([place])     "diet controlled" (11/08/2015)  . Iron deficiency anemia   . History of blood transfusion     "related to my leg surgery?"  . Cellulitis 11/08/2015    "both feet"  . Jaw cancer ([place]) 1990s    "squamous cell"   Past Surgical History  Procedure Laterality Date  . Squamous cell carcinoma excision Left 1993    "took my jaw out; got cadavar in there now"  . Tonsillectomy    . Orif tibia fracture Left ~ 1970  . Colonoscopy N/A 11/27/2014    Procedure: COLONOSCOPY;  Surgeon: Rogene Houston, MD;  [facility];  Service: Endoscopy;  Laterality: N/A;  830  . Incision and drainage abscess Left 06/16/2005    wide exc. abscess 5th toe  . Mandibular hardware removal Left 08/02/2015    Procedure:  HARDWARE REMOVAL TWO MANDIBULAR SCREWS;  Surgeon: Jannette Fogo, DDS;  Location: [facility];  Service: Oral Surgery;  Laterality: Left;  . Fracture surgery    . Cataract extraction w/ intraocular lens  implant, bilateral Bilateral 09/2015  . Cardiac catheterization  1990s; 04/16/2001  . Coronary artery bypass graft  1990's    "CABG X4"   Prior to Admission medications   Medication Sig Start Date  End Date Taking? Authorizing Provider  amLODipine (NORVASC) 10 MG tablet Take 1 tablet (10 mg total) by mouth daily. 03/01/15  Yes Herminio Commons, MD  aspirin 81 MG tablet Take 81 mg by mouth daily.   Yes Historical Provider, MD  cloNIDine (CATAPRES - DOSED IN MG/24 HR) 0.3 mg/24hr patch APPLY 1 PATCH ONCE WEEKLY 03/24/15  Yes Herminio Commons, MD  Difluprednate (DUREZOL) 0.05 % EMUL Apply 1 drop to eye 2 (two) times daily.   Yes Historical Provider, MD  ferrous sulfate 325 (65 FE) MG tablet Take 65 mg by mouth daily with breakfast.   Yes Historical Provider, MD  fish oil-omega-3 fatty acids 1000 MG capsule Take 1 capsule by mouth daily.     Yes  Historical Provider, MD  JANTOVEN 2.5 MG tablet TAKE 1 TABLET DAILY OR AS  DIRECTED BY COUMADIN CLINIC 02/09/15  Yes Herminio Commons, MD  losartan-hydrochlorothiazide (HYZAAR) 100-25 MG per tablet Take 1 tablet by mouth daily.     Yes Historical Provider, MD  Multiple Vitamins-Minerals (CENTRUM SILVER PO) Take 1 tablet by mouth daily.     Yes Historical Provider, MD  nebivolol (BYSTOLIC) 5 MG tablet Take 1 tablet (5 mg total) by mouth daily. 05/28/15  Yes Herminio Commons, MD  Probiotic Product ([place] PO) Take 1 capsule by mouth daily.    Yes Historical Provider, MD   Allergies Allergies  Allergen Reactions  . Lipitor [Atorvastatin] Other (See Comments)    SEVERE HEADACHE  . Simvastatin Other (See Comments)    MUSCLE ACHES  . Penicillins Rash  . Sulfa Antibiotics Rash    Family History Family History  Problem Relation Age of Onset  . Heart disease Mother    Social History  reports that he has never smoked. He has never used smokeless tobacco. He reports that he does not drink alcohol or use illicit drugs.  Review Of Systems:  Unable to obtain as patient is sedated on vent  Studies: 1/16 L. Foot xray > no acute bony findings or destructive changes  1/18 MRI L. Foot > diffuse cellulitis of left foot, abscess at third metatarsal, no findings for osteonecrosis  1/18 MRA LLE > patent popliteal with poor blood flow below knee, occluded anterior tibial artery  1/18 CXR > cardiomegaly and bilateral opacities suggestive of pulmonary edema  Cultures: 1/16 Blood x2 >> 1/18 abscess > 1/18 anaerobic > 1/18 abscess gram stain >  Antibiotics: 1/16 Aztreonam >> 1/17 1/16 Vancomycin >> 1/18 Clinda >> 1/18 Imipenem-Cilastatin >>   Events Since Admission: 1/18 Taken to OR for L 3rd toe amputation and I&D, developed acute respiratory distress requiring intubation  Current Status:  Vital Signs: Temp:  [98 F (36.7 C)-99.4 F (37.4 C)] 98.3 F (36.8 C) (01/19  0009) Pulse Rate:  [68-104] 104 (01/18 1935) Resp:  [18-24] 24 (01/18 1935) BP: (120-154)/(55-86) 152/86 mmHg (01/18 1935) SpO2:  [87 %-97 %] 87 % (01/18 1935) FiO2 (%):  [70 %] 70 % (01/18 2238) Weight:  [198 lb 6.6 oz (90 kg)] 198 lb 6.6 oz (90 kg) (01/18 2200)  Vent Settings: Vent Mode:  [-] PRVC FiO2 (%):  [70 %] 70 % Set Rate:  [12 bmp] 12 bmp Vt Set:  [600 mL] 600 mL PEEP:  [10 cmH20] 10 cmH20 Plateau Pressure:  [22 cmH20] 22 cmH20   Physical Examination: General:  Elderly male, sedated on vent Neuro:  Sedated on vent HEENT: [place]/AT, PERRL Neck: right subclavian cvl in place Cardiovascular:  Irregularly irregular, tachy, no appreciable murmur Lungs:  Vent assisted breaths, bibasilar crackles Abdomen:  Soft, non-distended, +BS Musculoskeletal:  Left foot bandaged, erythema LLE up to the knee Skin: erythematous LLE up to the knee  Principal Problem:   Lower extremity cellulitis Active Problems:   Diabetes mellitus ([place])   Dyslipidemia   Essential hypertension   Permanent atrial fibrillation (HCC)   Hx of CABG   Chronic anticoagulation   PVD (peripheral vascular disease) with claudication (HCC)   Necrotic toes (HCC)   Pressure ulcer   Acute pulmonary edema (HCC)   Acute congestive heart failure (HCC)   Acute respiratory failure with hypoxia (HCC)   Brief patient description:  80 y/o male with PMH of CAD s/p CABG, chronic Afib on warfarin, T2DM with neuropathy, HTN, PVD. On 1/18, he was taken to the OR for amputation of the third left toe with I&D and required urgent intubation due to hypoxemia and pulmonary edema.  ASSESSMENT AND PLAN  PULMONARY A: Acute respiratory failure with hypoxemia requiring mechanical ventilation 2/2 Acute pulmonary edema P:   Continue full vent support, wean as tolerated Maintain O2 at >90% SBT as tolerated Check ABG CXR in am  CARDIOVASCULAR A: CAD s/p CABG Chronic Afib on warfarin HTN PVD Likely acute decompensated CHF, no  recent Echo P:  Start Lasix 60 IV x 2 doses Start Metoprolol 5 mg q6h Hold warfarin until cleared by vascular EKG Echo Trend [place]  RENAL A: AKI Hypokalemia Hypomagnesemia Hypocalcemia P:   Monitor BMET and UOP Replace electrolytes as needed K supplement Check Mg, Phos  GASTROINTESTINAL A: GI prophylaxis P:   Famotidine NPO  HEMATOLOGIC A: Chronic anticoagulation with warfarin for Afib Leukocytosis DVT ppx with warfarin/heparin on hold P:  Restart warfarin/heparin when cleared by vascular CBC  INFECTIOUS A: Left foot gangrene/necrosis s/p amputation of 3rd left toe and I&D LLE cellulitis extending to knee P:   Continue abx as above Monitor for continued spreading of erythema/cellulitis for further intervention as indicated  ENDOCRINE A: T2DM with peripheral neuropathy P:   Continue SSI  NEUROLOGIC A: Vent sedation P:   Intermittent Fentanyl RASS goal -1   Zada Finders, M.D. Internal Medicine PGY1 Pager [phone]  11/11/2015, 1:22 AM

## 2015-11-10 NOTE — H&P (View-Only) (Signed)
[facility] [place]  Patient name: [name] MRN: [id number] DOB: Feb 09, 1936 Sex: male  REASON FOR CONSULT: left 3rd toe discoloration and left leg erythema  HPI: [name] is a 80 y.o. male, who presents to the [facility] ED from his podiatrist's office with three day history of progressive left 3rd toe darkening and 5 day history of left leg erythema and swelling. He reports having chills, but no fever. The patient reports one night that he noticed that his toe became darker. He denies any injury to this foot. He says that his foot has been painful and describes it as "electricity running through." The patient was last seen in the [facility] office on 10/06/15 by Dr. Scot Dock, where he had evidence of infrainguinal arterial occlusive disease on the left, but was essentially asymptomatic. He was scheduled for an 18 month follow-up after that. He denies any history on nonhealing wounds, significant claudication or rest pain. He has had a callus debrided on his left foot that is healing adequately.   He has a past medical history of CAD s/p CABG in the 1990s, atrial fibrillation on coumadin, diabetes mellitus with peripheral neuropathy and hypertension on multiple antihypertensives.   Past Medical History  Diagnosis Date  . Arteriosclerotic cardiovascular disease (ASCVD)     CABG in 1990s; 2002 total obstruction of LAD, CX and RCA with patent grafts and nl EF; Stress nuc. 2008 - mild LV dilation; normal EF; questionable small anteroapical scar; no ischemia  . Atrial fibrillation ([place])   . Chronic anticoagulation   . Arthritis     knees  . Pedal edema     "not a lot", per pt.  . Neuropathy due to type 2 diabetes mellitus ([place])     bilateral lower leg, hands  . Hypertension     states under control with meds., has been on med. x "long time"  . Diet-controlled diabetes mellitus ([place])   . History of squamous cell carcinoma 1990s    left jaw  . Peripheral vascular  disease ([place])   . 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 Cataracts, bilateral   . Dental crowns present   . Runny nose 07/27/2015    clear drainage, per pt.    Family History  Problem Relation Age of Onset  . Heart disease Mother     SOCIAL HISTORY: Social History   Social History  . Marital Status: Married    Spouse Name: N/A  . Number of Children: N/A  . Years of Education: N/A   Occupational History  . Not on file.   Social History Main Topics  . Smoking status: Never Smoker   . Smokeless tobacco: Never Used  . Alcohol Use: No  . Drug Use: No  . Sexual Activity: Not on file   Other Topics Concern  . Not on file   Social History Narrative    Allergies  Allergen Reactions  . Lipitor [Atorvastatin] Other (See Comments)    SEVERE HEADACHE  . Simvastatin Other (See Comments)    MUSCLE ACHES  . Penicillins Rash  . Sulfa Antibiotics Rash    Current Facility-Administered Medications  Medication Dose Route Frequency Provider Last Rate Last Dose  . aztreonam (AZACTAM) 1 g in dextrose 5 % 50 mL IVPB  1 g Intravenous 3 times per day Romona Curls, [facility]      . [START ON 11/09/2015] vancomycin (VANCOCIN) 1,500 mg in sodium chloride 0.9 %  500 mL IVPB  1,500 mg Intravenous Q24H Romona Curls, [facility]      . vancomycin (VANCOCIN) 1,750 mg in sodium chloride 0.9 % 500 mL IVPB  1,750 mg Intravenous Once Romona Curls, RPH 250 mL/hr at 11/08/15 1240 1,750 mg at 11/08/15 1240   Current Outpatient Prescriptions  Medication Sig Dispense Refill  . amLODipine (NORVASC) 10 MG tablet Take 1 tablet (10 mg total) by mouth daily. 90 tablet 3  . aspirin 81 MG tablet Take 81 mg by mouth daily.    . cloNIDine (CATAPRES - DOSED IN MG/24 HR) 0.3 mg/24hr patch APPLY 1 PATCH ONCE WEEKLY 12 patch 3  . Difluprednate (DUREZOL) 0.05 % EMUL Apply 1 drop to eye 2 (two) times daily.    . ferrous sulfate 325 (65 FE) MG tablet Take 65 mg by mouth daily with breakfast.    . fish  oil-omega-3 fatty acids 1000 MG capsule Take 1 capsule by mouth daily.      Marland Kitchen JANTOVEN 2.5 MG tablet TAKE 1 TABLET DAILY OR AS  DIRECTED BY COUMADIN CLINIC 90 tablet 3  . losartan-hydrochlorothiazide (HYZAAR) 100-25 MG per tablet Take 1 tablet by mouth daily.      . Multiple Vitamins-Minerals (CENTRUM SILVER PO) Take 1 tablet by mouth daily.      . nebivolol (BYSTOLIC) 5 MG tablet Take 1 tablet (5 mg total) by mouth daily. 90 tablet 3  . Probiotic Product ([facility] PO) Take 1 capsule by mouth daily.       REVIEW OF SYSTEMS:  [X]  denotes positive finding, [ ]  denotes negative finding Cardiac  Comments:  Chest pain or chest pressure:    Shortness of breath upon exertion:    Short of breath when lying flat:    Irregular heart rhythm: x afib      Vascular    Pain in calf, thigh, or hip brought on by ambulation:    Pain in feet at night that wakes you up from your sleep:     Blood clot in your veins:    Leg swelling:  x       Pulmonary    Oxygen at home:    Productive cough:     Wheezing:         Neurologic    Sudden weakness in arms or legs:     Sudden numbness in arms or legs:     Sudden onset of difficulty speaking or slurred speech:    Temporary loss of vision in one eye:     Problems with dizziness:         Gastrointestinal    Blood in stool:     Vomited blood:         Genitourinary    Burning when urinating:     Blood in urine:        Psychiatric    Major depression:         Hematologic    Bleeding problems:    Problems with blood clotting too easily:        Skin    Rashes or ulcers: x       Constitutional    Fever or chills: x     PHYSICAL EXAM: Filed Vitals:   11/08/15 1026 11/08/15 1215 11/08/15 1300  BP: 129/59 126/65 126/56  Pulse: 78 78 85  Temp: 99.2 F (37.3 C)    TempSrc: Oral    Resp: 18 20 22   SpO2: 97% 100% 97%  GENERAL: The patient is a well-nourished male, in no acute distress. The vital signs are documented  above. CARDIAC: Irregularly irregular rhythm. No carotid bruits.  VASCULAR: Faintly palpable left femoral pulse. Palpable right femoral pulse. Non palpable popliteal and pedal pulses. Brisk monophasic left DP and PT doppler signals. Brisk right biphasic DP and PT doppler signals.  PULMONARY: There is good air exchange bilaterally without wheezing or rales. ABDOMEN: Soft and non-tender with normal pitched bowel sounds.  MUSCULOSKELETAL: Darkened left third toe with dark discoloration at plantar aspect of left 2nd and 3rd toes. No drainage seen. Left leg is erythematous and warm from below knee to distal foot. Left calf soft and nontender.  NEUROLOGIC: Diminished sensation to feet bilaterally. Motor sensation intact.  SKIN:  Callous on plantar aspect of right foot.  PSYCHIATRIC: The patient has a normal affect.   MEDICAL ISSUES: Left lower extremity cellulitis with darkening of left 3rd toe and base of left 2nd and 3rd toes.   The patient has reasonable, yet abnormal doppler flow to his left foot. Based on his doppler exam, he has evidence of infrainguinal arterial occlusive disease. Plan for abdominal aortogram with bilateral runoff and possible intervention tomorrow with Dr. Scot Dock for further evaluation of his anatomy. If the patient's disease is not amenable to percutaneous intervention with balloon angioplasty or stenting, he may require a bypass. Discussed that he is at risk for amputation of his left third and possibly 2nd toe. Also discussed that he may not require any intervention and just require observation. This will depend on his arteriogram results.   Will have the hospitalist service admit for IV antibiotics. He is on coumadin for atrial fibrillation. Hold coumadin. His INR will need to be reversed with INR <1.6.   Virgina Jock, PA-C [facility] [place]      I have examined the patient, reviewed and agree with above. Very pleasant gentleman  followed in our office with a lower extremity arterial insufficiency. Had been seen one month ago. That time he had no tissue loss and no evidence of ischemia. He presented to his podiatrist in [facility] with discoloration of third toe on his left foot. This has progressed to extensive cellulitis from his knee distally into his foot. Does have a very good sounding anterior tibial and posterior tibial Doppler flow at the level of the ankle. Will need his Coumadin reversed and will need arteriography to determine if any option for revascularization as possible. Discussed this at length with the patient, his wife and daughter present. Explained that this could be limb threatening but suspect with the degree of tissue loss that he has at this can be a limb salvage situation. Will plan arteriography tomorrow if INR is normalized  Curt Jews, MD 11/08/2015 5:52 PM

## 2015-11-10 NOTE — Op Note (Signed)
    OPERATIVE NOTE   PROCEDURE: 1. Left third toe amputation 2. Incision and drainage of plantar and dorsal surface abscesses in Left foot  PRE-OPERATIVE DIAGNOSIS: left foot abscess with dead left third toe  POST-OPERATIVE DIAGNOSIS: same as above   SURGEON: Adele Barthel, MD  ANESTHESIA: general  ESTIMATED BLOOD LOSS: 50 cc  FINDING(S): 1.  Dead third toe with no bleeding 2.  Necrotic tissue surrounding third toe extending into second toe  3.  Frank pus tracking on the dorsal and plantar surface of third metatarsal bone  SPECIMEN(S):  Third toe, anaerobic and aerobic cultures  INDICATIONS:   [name] is a 80 y.o. male who presents with left third toe gangrene with now associated plantar abscesses.  Dr. Scot Dock recommended: left third toe amputation.  As Dr. Scot Dock was in the office, I offered to the patient and family to proceed with the amputation tonight.  I discussed in depth the nature of toe amputation with the patient, including risks, benefits, and alternatives.  The patient is aware that the risks of toe amputation include but are not limited to: bleeding, infection, myocardial infarction, stroke, death, failure to heal amputation wound, and possible need for more proximal amputation.  The patient is aware of the risks and agrees proceed forward with the procedure. .  DESCRIPTION: After obtaining full informed written consent, the patient was brought back to the operating room and placed supine upon the operating table.  The patient received IV antibiotics prior to induction.  After obtaining adequate anesthesia, the patient was prepped and draped in the standard fashion for: left toe amputation.  I made initially a racquet incision.  Note, the skin on the dorsal and plantar surface was slough even without an incision.  I dissected down to the bone with electrocautery.  There was no bleeding throughout this entire operation.  Upon approaching the metatarsal on the dorsal  surface, a pocket of pus began draining.  I cultured this pus with anaerobic and aerobic swabs.  I opened the dorsal surface with electrocautery to fully decompress the pus.  I released the tendons attached to the proximal phalange and completed the amputation of the third toe at the metatarsal and phalange joint.  At this point, I bluntly dissected into the plantar surface abscess overlying the third metatarsal.  Again, I fully opened the plantar surface overlying the third metatarsal.  There was necrotic tissue around the prior location of the third toe.  I sharply removed some the necrotic tissue and washed out this foot.  Again, there was no bleeding.  I packed this foot wound with a wet-to-dry of Kerlix.  An ABD pad was applied and then the foot wrapped with Kerlix to keep the bandages in place.  COMPLICATIONS: none  CONDITION: guarded   Adele Barthel, MD [facility] [place] Office: [phone] Pager: [phone]  11/10/2015, 9:48 PM

## 2015-11-10 NOTE — Progress Notes (Signed)
Patient ID: [name], male   DOB: 11-17-1935, 80 y.o.   MRN: [id number] Medicine attending: I personally examined this patient this morning and discussed management with resident physician Dr. Loleta Chance and 3rd year medical student Porfirio Oar. Ischemic changes left foot have progressed significantly over the last 24 hours. Area of ischemia has extended to the fourth toe and a large area of ischemia on the ball of the foot. MRI shows no bone involvement. Fluid collection with suspicion of abscess formation although clinically this seems less likely in the absence of any open wound on the foot. MRA shows patent popliteal and anterior tibial artery at their origins with occluded posterior tibial artery flow. INR slow to reverse with vitamin K. Unclear why. Normal liver functions. Surgery becoming urgent at this point. We will go ahead and transfuse fresh frozen plasma. I discussed this with the patient and multiple family members.

## 2015-11-10 NOTE — Progress Notes (Addendum)
[facility] Progress Note  Subjective   Having minimal pain with right leg but occasionally feels "tremors" in it.   Objective Filed Vitals:   11/09/15 2111 11/10/15 0426  BP: 122/56 120/64  Pulse: 86 68  Temp:  98 F (36.7 C)  Resp: 18 18  Tmax 99.2   Intake/Output Summary (Last 24 hours) at 11/10/15 0754 Last data filed at 11/09/15 1300  Gross per 24 hour  Intake    480 ml  Output      0 ml  Net    480 ml   Right lower leg very warm, erythematous and edematous from below-knee down. Black third right toe with increased darkening at base of 2nd and 3rd right toes. Some drainage from blister on dorsum of right foot near third toe. Minimal sensation in right foot. Pedal pulses not palpable.   Assessment/Planning: 80 y.o. male with right lower extremity erythema  -INR still supratherapeutic. Arteriogram cancelled. 2 mg vitamin K today. Hopefully can reschedule for tomorrow.  -For MRI right leg today. If this shows abscess in right 3rd toe, hopefully the OR schedule will allow for amputation of the right 3rd toe today. Otherwise plan for tomorrow. Keep NPO for now. -Continue IV antibiotics.   Alvia Grove, PA-C 11/10/2015 7:54 AM Pager: [phone]  Laboratory CBC    Component Value Date/Time   WBC 22.1* 11/10/2015 0254   HGB 9.8* 11/10/2015 0254   HCT 28.4* 11/10/2015 0254   PLT 222 11/10/2015 0254    BMET    Component Value Date/Time   NA 142 11/10/2015 0254   K 2.8* 11/10/2015 0254   CL 109 11/10/2015 0254   CO2 21* 11/10/2015 0254   GLUCOSE 126* 11/10/2015 0254   BUN 26* 11/10/2015 0254   CREATININE 1.37* 11/10/2015 0254   CREATININE 0.84 06/09/2013 1228   CALCIUM 6.4* 11/10/2015 0254   GFRNONAA 48* 11/10/2015 0254   GFRAA 56* 11/10/2015 0254    COAG Lab Results  Component Value Date   INR 2.34* 11/10/2015   INR 2.69* 11/09/2015   INR 2.81* 11/08/2015   No results found for: PTT  Antibiotics Anti-infectives    Start      Dose/Rate Route Frequency Ordered Stop   11/09/15 1230  vancomycin (VANCOCIN) 1,500 mg in sodium chloride 0.9 % 500 mL IVPB     1,500 mg 250 mL/hr over 120 Minutes Intravenous Every 24 hours 11/08/15 1202     11/08/15 1400  aztreonam (AZACTAM) 1 g in dextrose 5 % 50 mL IVPB     1 g 100 mL/hr over 30 Minutes Intravenous 3 times per day 11/08/15 1202     11/08/15 1215  vancomycin (VANCOCIN) 1,750 mg in sodium chloride 0.9 % 500 mL IVPB     1,750 mg 250 mL/hr over 120 Minutes Intravenous  Once 11/08/15 1202 11/08/15 [place], PA-C [facility] Office: [phone] Pager: [phone] 11/10/2015 7:54 AM  Agree with above. Unfortunately, despite 2 doses of vitamin K, his INR is still significantly elevated. Therefore we will be unable to perform his arteriogram today. I will be scheduled this for tomorrow. In addition, he is scheduled for an MRI of his foot today. If this shows an abscess we may need to proceed with open amputation of the left third toe today despite his elevated INR. If he requires bypass, pending the results of his arteriogram, this could potentially be arranged for Friday. He will need preoperative vein mapping  if he is a candidate for a bypass. If he is a candidate for an endovascular approach to his infrainguinal arterial occlusive disease and this could be done at the same time as his arteriogram.  Deitra Mayo, MD, [place] [phone] Office: [phone]

## 2015-11-10 NOTE — Progress Notes (Signed)
CRITICAL VALUE ALERT  Critical value received:  Calcium 6.4  Date of notification:  11/10/2015  Time of notification:  05:39  Critical value read back:Yes  Nurse who received alert:  Jairo Ben, RN  MD notified (1st page):  Dr. Gwenlyn Fudge  Time of first page:  05:41  MD notified (2nd page):  Time of second page:  Responding MD:  Dr. Gwenlyn Fudge  Time MD responded:  05:43

## 2015-11-10 NOTE — Progress Notes (Signed)
   Daily Progress Note  Asked by Dr. Scot Dock to take patient back to OR for L open 3rd toe amputation with drainage of plantar surface abscess to try to avoid ascending cellulitis which might already be occuring.  Primary team is giving 2 units of FFP at this point.    - pt posted to OR schedule for above case - Family and patient agree to proceed - Risk include but not limited to: bleeding, infection, nerve damage, inability to salvage the left foot, and need for additional procedures.  Adele Barthel, MD [facility] [place] Office: [phone] Pager: [phone]  11/10/2015, 3:43 PM

## 2015-11-10 NOTE — Transfer of Care (Signed)
Immediate Anesthesia Transfer of Care Note  Patient: [name]  Procedure(s) Performed: Procedure(s): INCISION AND DRAINAGE LEFT FOOT, AMPUTATION OF LEFT THIRD TOE (Left)  Patient Location: SICU  Anesthesia Type:General  Level of Consciousness: Patient remains intubated per anesthesia plan  Airway & Oxygen Therapy: Patient remains intubated per anesthesia plan and Patient placed on Ventilator (see vital sign flow sheet for setting)  Post-op Assessment: Report given to RN and Post -op Vital signs reviewed and stable  Post vital signs: Reviewed and stable  Last Vitals:  Filed Vitals:   11/10/15 1848 11/10/15 1935  BP: 146/73 152/86  Pulse: 85 104  Temp: 37.1 C 36.7 C  Resp: 18 24    Complications: No apparent anesthesia complications

## 2015-11-10 NOTE — Progress Notes (Signed)
Pt LLE cellulitis has blisters formed on top of foot and under foot which are weeping. Gauze dsg applied; MD and PA Kim from Vascular paged and notified; no new orders received. Pt remains asymptomatic and resting comfortably in bed with family at bedside. Will continue to closely monitor. Delia Heady RN

## 2015-11-10 NOTE — Progress Notes (Addendum)
Subjective: [name] reports no change in sensation in his foot overnight; he reports that it continues to feel "tingly," but not painful. Additionally, he reports that he is feeling very tired.   Objective: Vital signs in last 24 hours: Filed Vitals:   11/09/15 0627 11/09/15 1325 11/09/15 2111 11/10/15 0426  BP: 120/56 126/59 122/56 120/64  Pulse: 80 91 86 68  Temp: 98.7 F (37.1 C) 99.2 F (37.3 C)  98 F (36.7 C)  TempSrc: Oral Oral  Oral  Resp: 20 18 18 18   Height:      Weight:      SpO2: 99% 99% 97% 97%    Intake/Output Summary (Last 24 hours) at 11/10/15 3299 Last data filed at 11/09/15 1300  Gross per 24 hour  Intake    480 ml  Output      0 ml  Net    480 ml    Exam: General: tired-appearing elderly man lying in bed HEENT: EOMI, sclera clear, moist mucous membranes Cardio: irregular rhythm, no murmurs, rubs or gallops Pulm/Chest: clear to auscultation bilaterally, no wheezes or crackles  Abd: soft, non-tender  MSK: Left leg with warm erythematous plaques extending to 10-15cm below the knee. Cutaneous anesthesia from mid-shin through distal foot. Left third toe black with overlying bullae, serosanguinous drainage from bulla at dorsum of left foot near base of third toe. Black area has increased from yesterday, now extending more proximal from base of toe. Unable to palpate dorsalis pedis pulse on left foot.  Neuro: Diminished sensation to feet bilaterally.  Psych: Appropriate behavior and affect.   Lab Results: BMP notable for K 2.8, BUN 26, Cr 1.37.  Ca 6.4 mg/dL Mg 1.2 mg/dL Albumin 2.2 g/dL  WBC 22.1, H/H 9.8/28.4, Plt 222, MCV 92.8 PT/INR 25.4/2.34 CRP 19.9 mg/dL   Micro Results: Blood cultures from 11/08/15: No growth <24 hrs.   Studies/Results: LLE Duplex preliminary result -- No DVT or Baker's cyst. Enlarged L inguinal lymph node.    Medications: Scheduled Meds: . aztreonam  1 g Intravenous 3 times per day  . calcium gluconate  1 g  Intravenous Once  . insulin aspart  0-5 Units Subcutaneous QHS  . insulin aspart  0-9 Units Subcutaneous TID WC  . nebivolol  5 mg Oral Daily  . phytonadione (VITAMIN K) IV  2 mg Intravenous Once  . sodium chloride  3 mL Intravenous Q12H  . vancomycin  1,500 mg Intravenous Q24H   PRN Meds:.acetaminophen   Assessment/Plan: [name] is a 80 year-old man with history of atrial fibrillation, diabetes, hypertension and peripheral vascular disease who presented with rapidly-progressive cellulitis of the left lower extremity and questionable necrosis of the left third toe.   1. Left lower extremity cellulitis: Clinically, his foot looks worse today, with expansion of black area overlying his third toe. Additionally, his subjective report of feeling very tired, his markedly elevated CRP and his low calcium level this morning are suspicious for worsening infection. Using the [facility] score for necrotizing soft tissue infection, he has a score of 7, which is associated with intermediate risk of necrotizing infection. Given all this, we will order an MRI of the foot for this morning. With his history of peripheral vascular disease, we will add angiography to this study to help discern whether any necrosis is ischemic rather than infectious in nature. Vascular surgery is following and hopefully would be able to amputate the left third toe today or tomorrow if MRI is concerning for necrosis.  -  MRI/angiography of left foot  - Continue vancomycin and aztreonam  2. Atrial fibrillation: Rates well-controlled. Continuing to reverse INR with vitamin K. If his toe is determined to need amputation in the next two days, he will likely need fresh frozen plasma to reverse his INR as vitamin K has not been very successful thus far.  - Continue nebivolol 5 mg daily  - Check PT/INR tomorrow morning   3. Hypocalcemia: Possibly a sign of worsening infection. He received calcium this morning after critical lab value was  reported. This could be contributing to the "tingling" sensation he has reported in his legs. He denies cramping pain and does not have other signs of hypocalcemia. We will continue to monitor him closely.  - IV calcium gluconate 1 g given this morning - Recheck BMP tomorrow morning   4. Acute kidney injury: Improving, but still not to baseline. Likely pre-renal.  - Continue fluids at 100 mL/hr  - Recheck BMP tomorrow morning  5. Hypertension: Well-controlled while holding multiple home medications. - Holding amlodipine 10 mg daily  - Holding clonidine 0.3 mg patch  - Holding losartan 100 mg daily  - Holding hydrochlorothiazide 25 mg daily  - Continue nebivolol 5 mg daily   6. FEN/GI: Fluids: NS @ 169m/hr Electrolytes: replete, as necessary Nutrition: NPO with sips    This is a MCareers information officerNote.  The care of the patient was discussed with Dr. FMelburn Hakeand the assessment and plan formulated with their assistance.  Please see their attached note for official documentation of the daily encounter.   LOS: 2 days     KEverlene Balls MMapletonof Medicine  11/10/2015, 8:23 AM

## 2015-11-10 NOTE — Anesthesia Procedure Notes (Addendum)
Central Venous Catheter Insertion Performed by: anesthesiologist Patient location: OR. Preanesthetic checklist: patient identified, IV checked, site marked, risks and benefits discussed, surgical consent, monitors and equipment checked, pre-op evaluation, timeout performed and anesthesia consent Landmarks identified and Seldinger technique used Catheter size: 8 Fr Central line was placed.Double lumen Procedure performed without using ultrasound guided technique. Attempts: 1 Following insertion, dressing applied, line sutured and Biopatch. Post procedure assessment: blood return through all ports, free fluid flow and no air. Patient tolerated the procedure well with no immediate complications.   Procedure Name: Intubation Date/Time: 11/10/2015 9:09 PM Performed by: Jacquiline Doe A Pre-anesthesia Checklist: Patient identified, Timeout performed, Emergency Drugs available, Suction available and Patient being monitored Patient Re-evaluated:Patient Re-evaluated prior to inductionOxygen Delivery Method: Circle system utilized Preoxygenation: Pre-oxygenation with 100% oxygen Intubation Type: IV induction and Cricoid Pressure applied Ventilation: Mask ventilation without difficulty Laryngoscope Size: Mac, 4 and Glidescope Grade View: Grade I Tube type: Subglottic suction tube Tube size: 8.0 mm Number of attempts: 1 Airway Equipment and Method: Rigid stylet and Video-laryngoscopy Placement Confirmation: ETT inserted through vocal cords under direct vision,  breath sounds checked- equal and bilateral and positive ETCO2 Secured at: 24 cm Tube secured with: Tape Dental Injury: Teeth and Oropharynx as per pre-operative assessment  Difficulty Due To: Difficulty was anticipated, Difficult Airway- due to reduced neck mobility, Difficult Airway- due to anterior larynx and Difficult Airway- due to limited oral opening Future Recommendations: Recommend- induction with short-acting agent, and alternative  techniques readily available

## 2015-11-10 NOTE — Progress Notes (Addendum)
Patient ID: [name], male   DOB: May 13, 1936, 80 y.o.   MRN: [id number]   Subjective: [name] is feeling particularly tired today; her night but did not have any objective fevers. Also, he is having slightly more "twinges" in his right lower extremity. No other complaints this morning.  Objective: Vital signs in last 24 hours: Filed Vitals:   11/09/15 0627 11/09/15 1325 11/09/15 2111 11/10/15 0426  BP: 120/56 126/59 122/56 120/64  Pulse: 80 91 86 68  Temp: 98.7 F (37.1 C) 99.2 F (37.3 C)  98 F (36.7 C)  TempSrc: Oral Oral  Oral  Resp: 20 18 18 18   Height:      Weight:      SpO2: 99% 99% 97% 97%   Physical exam: General: resting in bed comfortably, appropriately conversational Cardiac: regular rate and rhythm, no rubs, murmurs or gallops Pulm: breathing well, clear to auscultation bilaterally Lymph: Single enlarged left inguinal lymph node Skin: The left lower extremity has an extensive ill-defined erythematous plaque that is warm to the touch extending just below the knee. The left third toe is soft and black with superficial bulla; the entire area is extending from yesterday on both the dorsal and plantar surfaces.    Neuro: alert and oriented X3, cranial nerves II-XII grossly intact, moving all extremities well  Lab Results: Basic Metabolic Panel:  Recent Labs Lab 11/09/15 0403 11/10/15 0254  NA 143 142  K 3.5 2.8*  CL 108 109  CO2 23 21*  GLUCOSE 117* 126*  BUN 25* 26*  CREATININE 1.44* 1.37*  CALCIUM 6.8* 6.4*  MG  --  1.2*   Liver Function Tests:  Recent Labs Lab 11/08/15 1032 11/09/15 0403 11/10/15 0254  AST 23 28  --   ALT 23 23  --   ALKPHOS 96 110  --   BILITOT 1.8* 1.9*  --   PROT 5.7* 5.4*  --   ALBUMIN 2.7* 2.4* 2.2*   CBC:  Recent Labs Lab 11/08/15 1032 11/09/15 0403 11/10/15 0254  WBC 21.8* 18.3* 22.1*  NEUTROABS 18.8*  --   --   HGB 11.0* 10.3* 9.8*  HCT 32.4* 30.1* 28.4*  MCV 93.1 93.8 92.8  PLT 221 211 222    Coagulation:  Recent Labs Lab 11/08/15 1032 11/09/15 0403 11/10/15 0254  LABPROT 29.2* 28.2* 25.4*  INR 2.81* 2.69* 2.34*   Micro Results: Recent Results (from the past 240 hour(s))  Culture, blood (routine x 2)     Status: None (Preliminary result)   Collection Time: 11/08/15 11:42 AM  Result Value Ref Range Status   Specimen Description BLOOD RIGHT ANTECUBITAL  Final   Special Requests BOTTLES DRAWN AEROBIC AND ANAEROBIC 5CCS  Final   Culture NO GROWTH < 24 HOURS  Final   Report Status PENDING  Incomplete   Medications: I have reviewed the patient's current medications. Scheduled Meds: . aztreonam  1 g Intravenous 3 times per day  . calcium gluconate  1 g Intravenous Once  . insulin aspart  0-5 Units Subcutaneous QHS  . insulin aspart  0-9 Units Subcutaneous TID WC  . nebivolol  5 mg Oral Daily  . phytonadione (VITAMIN K) IV  2 mg Intravenous Once  . sodium chloride  3 mL Intravenous Q12H  . vancomycin  1,500 mg Intravenous Q24H   Continuous Infusions:  PRN Meds:.acetaminophen   Assessment/Plan:  [name] is a 80 year old man here with left lower extremity cellulitis with underlying arterial insufficiency. After his second  day of IV antibiotics, the necrotic bulla on his left toe is enlarging, his renal insufficiency is persisting despite fluids, his inflammatory marker is markedly elevated, and he is hypocalcemic with an increasing leukocytosis; thus, I'm concerned this could be transforming into necrotizing fasciitis. We will get an MRI today to further evaluate. Given his worsening necrosis and question of necrotizing fasciitis, we will advance his antibiotics to vancomycin, imipinem-cilastatin, and clindamycin to cover anaerobes with imipenem and increase tissue penetration and toxin inhibition with clindamycin. I think his abdominal arteriogram should be postponed until we further characterize his infection in the setting of his renal insufficiency and supratherapeutic  INR. We'll try to get an MR angiogram to further characterize his arterial stenoses but I think his infection is his main issue.   Left lower extremity cellulitis with questionable necrotizing fasciitis: I'm beginning to wonder if this is cellulitis transforming into necrotizing fasciitis with underlying arterial insufficiency. -MRI left foot today; will try to also coordinate MRA -Stopped aztreonam -Started IV imipenem-cilastatin per pharmacy -Started IV clindamycin 966m every 8 hours -Continue IV vancomycin per pharmacy -CBC tomorrow  Atrial fibrillation: Rates well controlled. Reversing INR with vitamin K to do arteriogram tomorrow. -Holding warfarin -Continue nebivolol 5 mg daily  Acute kidney injury: Creatinine 1.4 again today; his baseline is less than 1. -Re-hydrating with normal saline at 100cc/hr -BMP tomorrow  Hypertension: Pressures well-controlled with only nebivolol. -Continue nebivolol 5 mg daily -Holding amlodipine 10 mg daily -Holding clonidine patch 0.3 mg daily -Holding losartan 100 mg daily -[place]olding hydrochlorothiazide 25 mg daily  Pre-diabetes: Last A1c . This is diet-controlled. -Continue sensitive sliding scale  Dispo: Disposition is deferred at this time, awaiting improvement of current medical problems.  Anticipated discharge in approximately 3-5 day(s).   The patient does have a current PCP (Lemmie Evens MD) and [facility].  The patient does have transportation limitations that hinder transportation to clinic appointments.  .Services Needed at time of discharge: Y = Yes, Blank = No PT:   OT:   RN:   Equipment:   Other:     LOS: 2 days   KLoleta Chance MD 11/10/2015, 8:19 AM

## 2015-11-10 NOTE — Progress Notes (Signed)
ANTIBIOTIC CONSULT NOTE - Follow up   Pharmacy Consult for vancomycin/Discontinue Aztreonam & change to Imipenem Indication: cellulitis  Allergies  Allergen Reactions  . Lipitor [Atorvastatin] Other (See Comments)    SEVERE HEADACHE  . Simvastatin Other (See Comments)    MUSCLE ACHES  . Penicillins Rash  . Sulfa Antibiotics Rash    Patient Measurements: Height: 5' 8"  (172.7 cm) Weight: 196 lb 10.4 oz (89.2 kg) IBW/kg (Calculated) : 68.4 Adjusted Body Weight:   Vital Signs: Temp: 98 F (36.7 C) (01/18 0426) Temp Source: Oral (01/18 0426) BP: 120/64 mmHg (01/18 0426) Pulse Rate: 68 (01/18 0426) Intake/Output from previous day: 01/17 0701 - 01/18 0700 In: 480 [P.O.:480] Out: -  Intake/Output from this shift:    Labs:  Recent Labs  11/08/15 1032 11/09/15 0403 11/10/15 0254  WBC 21.8* 18.3* 22.1*  HGB 11.0* 10.3* 9.8*  PLT 221 211 222  CREATININE 1.40* 1.44* 1.37*   Estimated Creatinine Clearance: 49 mL/min (by C-G formula based on Cr of 1.37). No results for input(s): VANCOTROUGH, VANCOPEAK, VANCORANDOM, GENTTROUGH, GENTPEAK, GENTRANDOM, TOBRATROUGH, TOBRAPEAK, TOBRARND, AMIKACINPEAK, AMIKACINTROU, AMIKACIN in the last 72 hours.   Microbiology: Recent Results (from the past 720 hour(s))  Culture, blood (routine x 2)     Status: None (Preliminary result)   Collection Time: 11/08/15 10:32 AM  Result Value Ref Range Status   Specimen Description BLOOD RIGHT ANTECUBITAL  Final   Special Requests BOTTLES DRAWN AEROBIC AND ANAEROBIC 5CC  Final   Culture NO GROWTH 1 DAY  Final   Report Status PENDING  Incomplete  Culture, blood (routine x 2)     Status: None (Preliminary result)   Collection Time: 11/08/15 11:42 AM  Result Value Ref Range Status   Specimen Description BLOOD RIGHT ANTECUBITAL  Final   Special Requests BOTTLES DRAWN AEROBIC AND ANAEROBIC 5CCS  Final   Culture NO GROWTH 2 DAYS  Final   Report Status PENDING  Incomplete    Medical History: Past  Medical History  Diagnosis Date  . Arteriosclerotic cardiovascular disease (ASCVD)     CABG in 1990s; 2002 total obstruction of LAD, CX and RCA with patent grafts and nl EF; Stress nuc. 2008 - mild LV dilation; normal EF; questionable small anteroapical scar; no ischemia  . Atrial fibrillation ([place])   . Chronic anticoagulation   . Arthritis     "left knee" (11/08/2015)  . Pedal edema     "not a lot", per pt.  . Diabetic peripheral neuropathy (HCC)     bilateral lower leg, hands  . Hypertension     states under control with meds., has been on med. x "long time"  . Peripheral vascular disease ([place])   . 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 Dental crowns present   . Runny nose 07/27/2015    clear drainage, per pt.  . Myocardial infarction ([place]) 1990s    "after my jaw OR"  . Type II diabetes mellitus ([place])     "diet controlled" (11/08/2015)  . Iron deficiency anemia   . History of blood transfusion     "related to my leg surgery?"  . Cellulitis 11/08/2015    "both feet"  . Jaw cancer ([place]) 1990s    "squamous cell"    Assessment: 26 yom with cellulitis of LL leg. Per MD - significant PCN allergy (documented as rash per [facility]).  Currently on day # 2 of vancomycin /aztreonam for cellulitis. The patient  spiked a fever of  102.5 overnight and had some night sweats.  Afeb, wbc 21.8 on admit.  Acute kidney injury , SCr 1.44 > now 1.37 today. Baseline is <1.0. Estimated CrCl~49 ml/min. MD is Re-hydrating with normal saline at 100cc/hr.   Internal medicine physcian noted Hot erythematous left lower extremity extending up to 10cm below the knee with cutaneous anesthesia and surrounding edema and  left second toe is black with peripheral petechiae and crepitus. MD is concerned that cellulitis is  transforming into necrotizing fasciitis. Thus Aztreonam discontinued today, changing to Imipenem. Continuing Vancomycin.   1/16 vanc>> 1/16 aztreonam>>1/18 1/18  Imipenem>>  1/16 BCx2>>NGTD  Goal of Therapy:  Vancomycin trough level 10-15 mcg/ml  Plan:  Start Imipenem 500 mg IV q8h Continue  Vanc 1545m IV q24h Monitor clinical progress, c/s, renal function, abx plan/LOT VT@SS  as indicated  RNicole Cella RPh Clinical Pharmacist Pager: [phone] 11/10/2015 12:16 PM

## 2015-11-10 NOTE — Progress Notes (Signed)
[facility] received calls from both Dr. Bridgett Larsson, Vascular Surgery and Dr. Ermalene Postin, Anesthesia, regarding a decline in this patient as he was brought down to the OR for an I&D of the left foot and amputation for the left third toe due to concern of ascending cellulitis and necrotizing fasciitis with ischemia. Today, patient had been afebrile, normotensive, HR 80-90s, RR 18 and satting well on 2L [place]. Tonight, as patient was brought to the OR clinically he appeared worse with HR in the 120s, tachypnea with RR in the 30s and desatting to 75-77%. After discussion with the family with Dr. Ermalene Postin, the decision was made to intubate the patient. There is concern for pulmonary edema, possibly from recent fluid and FFP administration, or sepsis 2/2 cellulitis. Dr. Ermalene Postin will kindly contact PCCM for transfer of care after surgery as patient is now intubated and declining. He is not requiring pressors at this time. I will also reach out to our residents on ICU this month to make them aware of the transfer. Our attending on call was notified of this change.   Osa Craver, DO PGY-2 Internal Medicine Resident Pager # [phone] 11/10/2015 9:51 PM

## 2015-11-10 NOTE — Progress Notes (Signed)
PATIENT TAKEN TO O.R. FOR LEFT LOWER EXTREMITY I&D AND LEFT 3RD TOE AMPUTATION.   -VIA BED -TELE REMOVED--CCMD NOTIFIED -RN WITH PATIENT AND TRANSPORTER -FAMILY PRESENT

## 2015-11-10 NOTE — Interval H&P Note (Signed)
History and Physical Interval Note:  11/10/2015 8:32 PM  [name]  has presented today for surgery, with the diagnosis of INFECTED LEFT FOOT  The various methods of treatment have been discussed with the patient and family. After consideration of risks, benefits and other options for treatment, the patient has consented to  Procedure(s): IRRIGATION AND DEBRIDEMENT LEFT FOOT, AMPUTATION OF LEFT THIRD TOE (Left) as a surgical intervention .  The patient's history has been reviewed, patient examined, no change in status, stable for surgery.  I have reviewed the patient's chart and labs.  Questions were answered to the patient's satisfaction.     Adele Barthel

## 2015-11-10 NOTE — Anesthesia Preprocedure Evaluation (Addendum)
Anesthesia Evaluation  Patient identified by MRN, date of birth, ID band Patient awake  General Assessment Comment:Short of breath and anxious, restless, can't lay flat  Reviewed: Allergy & Precautions, NPO status , Patient's Chart, lab work & pertinent test results  History of Anesthesia Complications Negative for: history of anesthetic complications  Airway Mallampati: III  TM Distance: >3 FB Neck ROM: Limited  Mouth opening: Limited Mouth Opening  Dental no notable dental hx.    Pulmonary shortness of breath,     + wheezing      Cardiovascular hypertension, Pt. on medications and Pt. on home beta blockers (-) angina+ CAD, + Past MI, + CABG and + Peripheral Vascular Disease  + dysrhythmias Atrial Fibrillation  Rhythm:Irregular Rate:Tachycardia     Neuro/Psych neg Seizures  Neuromuscular disease negative psych ROS   GI/Hepatic negative GI ROS, Neg liver ROS,   Endo/Other  diabetes, Type 2  Renal/GU ARFRenal disease     Musculoskeletal  (+) Arthritis ,   Abdominal   Peds  Hematology  (+) anemia ,   Anesthesia Other Findings   Reproductive/Obstetrics                            Anesthesia Physical Anesthesia Plan  ASA: III  Anesthesia Plan: General   Post-op Pain Management:    Induction: Intravenous and Rapid sequence  Airway Management Planned: Oral ETT  Additional Equipment: Arterial line and CVP  Intra-op Plan:   Post-operative Plan: Post-operative intubation/ventilation  Informed Consent: I have reviewed the patients History and Physical, chart, labs and discussed the procedure including the risks, benefits and alternatives for the proposed anesthesia with the patient or authorized representative who has indicated his/her understanding and acceptance.   Dental advisory given  Plan Discussed with: CRNA and Surgeon  Anesthesia Plan Comments:         Anesthesia Quick  Evaluation

## 2015-11-11 ENCOUNTER — Encounter (HOSPITAL_COMMUNITY)
Admission: EM | Disposition: A | Discharge status: Skilled Nursing Facility | Payer: Self-pay | Source: Home / Self Care | Attending: Pulmonary Disease

## 2015-11-11 ENCOUNTER — Inpatient Hospital Stay (HOSPITAL_COMMUNITY): Payer: Medicare Other | Admitting: Certified Registered Nurse Anesthetist

## 2015-11-11 ENCOUNTER — Inpatient Hospital Stay (HOSPITAL_COMMUNITY): Payer: Medicare Other

## 2015-11-11 ENCOUNTER — Ambulatory Visit (HOSPITAL_COMMUNITY): Payer: Medicare Other

## 2015-11-11 ENCOUNTER — Encounter (HOSPITAL_COMMUNITY): Payer: Self-pay | Admitting: Certified Registered Nurse Anesthetist

## 2015-11-11 DIAGNOSIS — I509 Heart failure, unspecified: Secondary | ICD-10-CM | POA: Insufficient documentation

## 2015-11-11 DIAGNOSIS — J80 Acute respiratory distress syndrome: Secondary | ICD-10-CM

## 2015-11-11 DIAGNOSIS — J810 Acute pulmonary edema: Secondary | ICD-10-CM | POA: Insufficient documentation

## 2015-11-11 DIAGNOSIS — J9601 Acute respiratory failure with hypoxia: Secondary | ICD-10-CM | POA: Insufficient documentation

## 2015-11-11 HISTORY — PX: AMPUTATION: SHX166

## 2015-11-11 LAB — BASIC METABOLIC PANEL
Anion gap: 11 (ref 5–15)
BUN: 27 mg/dL — ABNORMAL HIGH (ref 6–20)
CHLORIDE: 113 mmol/L — AB (ref 101–111)
CO2: 20 mmol/L — ABNORMAL LOW (ref 22–32)
Calcium: 7 mg/dL — ABNORMAL LOW (ref 8.9–10.3)
Creatinine, Ser: 1.25 mg/dL — ABNORMAL HIGH (ref 0.61–1.24)
GFR calc non Af Amer: 54 mL/min — ABNORMAL LOW (ref >60)
Glucose, Bld: 147 mg/dL — ABNORMAL HIGH (ref 65–99)
POTASSIUM: 3.9 mmol/L (ref 3.5–5.1)
SODIUM: 144 mmol/L (ref 135–145)

## 2015-11-11 LAB — CBC
HCT: 28.8 % — ABNORMAL LOW (ref 39.0–52.0)
HEMATOCRIT: 32.3 % — AB (ref 39.0–52.0)
HEMOGLOBIN: 10.8 g/dL — AB (ref 13.0–17.0)
HEMOGLOBIN: 9.8 g/dL — AB (ref 13.0–17.0)
MCH: 30.9 pg (ref 26.0–34.0)
MCH: 31.4 pg (ref 26.0–34.0)
MCHC: 33.4 g/dL (ref 30.0–36.0)
MCHC: 34 g/dL (ref 30.0–36.0)
MCV: 92.3 fL (ref 78.0–100.0)
MCV: 92.3 fL (ref 78.0–100.0)
Platelets: 265 10*3/uL (ref 150–400)
Platelets: 314 10*3/uL (ref 150–400)
RBC: 3.5 MIL/uL — ABNORMAL LOW (ref 4.22–5.81)
RBC: 3.12 MIL/uL — AB (ref 4.22–5.81)
RDW: 14.4 % (ref 11.5–15.5)
RDW: 14.5 % (ref 11.5–15.5)
WBC: 18.2 10*3/uL — ABNORMAL HIGH (ref 4.0–10.5)
WBC: 18.8 10*3/uL — ABNORMAL HIGH (ref 4.0–10.5)

## 2015-11-11 LAB — POCT I-STAT 3, ART BLOOD GAS (G3+)
Acid-base deficit: 7 mmol/L — ABNORMAL HIGH (ref 0.0–2.0)
Acid-base deficit: 4 mmol/L — ABNORMAL HIGH (ref 0.0–2.0)
BICARBONATE: 21.7 meq/L (ref 20.0–24.0)
Bicarbonate: 20.5 mEq/L (ref 20.0–24.0)
O2 Saturation: 100 %
O2 Saturation: 99 %
PCO2 ART: 47.9 mmHg — AB (ref 35.0–45.0)
PH ART: 7.237 — AB (ref 7.350–7.450)
PH ART: 7.317 — AB (ref 7.350–7.450)
PO2 ART: 175 mmHg — AB (ref 80.0–100.0)
Patient temperature: 97.9
Patient temperature: 98.3
TCO2: 22 mmol/L (ref 0–100)
TCO2: 23 mmol/L (ref 0–100)
pCO2 arterial: 42.3 mmHg (ref 35.0–45.0)
pO2, Arterial: 311 mmHg — ABNORMAL HIGH (ref 80.0–100.0)

## 2015-11-11 LAB — PREPARE FRESH FROZEN PLASMA
Unit division: 0
Unit division: 0

## 2015-11-11 LAB — COMPREHENSIVE METABOLIC PANEL
ALBUMIN: 2.6 g/dL — AB (ref 3.5–5.0)
ALK PHOS: 131 U/L — AB (ref 38–126)
ALT: 39 U/L (ref 17–63)
ANION GAP: 13 (ref 5–15)
AST: 37 U/L (ref 15–41)
BILIRUBIN TOTAL: 2.1 mg/dL — AB (ref 0.3–1.2)
BUN: 29 mg/dL — AB (ref 6–20)
CALCIUM: 7.9 mg/dL — AB (ref 8.9–10.3)
CO2: 23 mmol/L (ref 22–32)
CREATININE: 1.22 mg/dL (ref 0.61–1.24)
Chloride: 108 mmol/L (ref 101–111)
GFR calc Af Amer: >60 mL/min (ref >60)
GFR calc non Af Amer: 55 mL/min — ABNORMAL LOW (ref >60)
GLUCOSE: 126 mg/dL — AB (ref 65–99)
Potassium: 3.2 mmol/L — ABNORMAL LOW (ref 3.5–5.1)
Sodium: 144 mmol/L (ref 135–145)
TOTAL PROTEIN: 6.9 g/dL (ref 6.5–8.1)

## 2015-11-11 LAB — GLUCOSE, CAPILLARY
GLUCOSE-CAPILLARY: 107 mg/dL — AB (ref 65–99)
GLUCOSE-CAPILLARY: 109 mg/dL — AB (ref 65–99)
GLUCOSE-CAPILLARY: 148 mg/dL — AB (ref 65–99)
Glucose-Capillary: 153 mg/dL — ABNORMAL HIGH (ref 65–99)
Glucose-Capillary: 116 mg/dL — ABNORMAL HIGH (ref 65–99)
Glucose-Capillary: 99 mg/dL (ref 65–99)

## 2015-11-11 LAB — PARATHYROID HORMONE, INTACT (NO CA): PTH: 50 pg/mL (ref 15–65)

## 2015-11-11 LAB — PROTIME-INR
INR: 1.89 — ABNORMAL HIGH (ref 0.00–1.49)
INR: 1.98 — AB (ref 0.00–1.49)
INR: 2.28 — AB (ref 0.00–1.49)
PROTHROMBIN TIME: 22.4 s — AB (ref 11.6–15.2)
Prothrombin Time: 21.6 seconds — ABNORMAL HIGH (ref 11.6–15.2)
Prothrombin Time: 24.9 seconds — ABNORMAL HIGH (ref 11.6–15.2)

## 2015-11-11 LAB — DIC (DISSEMINATED INTRAVASCULAR COAGULATION) PANEL
D DIMER QUANT: 2.04 ug{FEU}/mL — AB (ref 0.00–0.50)
INR: 1.9 — AB (ref 0.00–1.49)
PROTHROMBIN TIME: 21.7 s — AB (ref 11.6–15.2)

## 2015-11-11 LAB — TROPONIN I
TROPONIN I: 0.12 ng/mL — AB (ref <0.031)
Troponin I: 0.51 ng/mL (ref <0.031)
Troponin I: 0.35 ng/mL — ABNORMAL HIGH (ref <0.031)

## 2015-11-11 LAB — BRAIN NATRIURETIC PEPTIDE
B NATRIURETIC PEPTIDE 5: 1346.1 pg/mL — AB (ref 0.0–100.0)
B NATRIURETIC PEPTIDE 5: 1070.3 pg/mL — AB (ref 0.0–100.0)

## 2015-11-11 LAB — TSH: TSH: 4.746 u[IU]/mL — ABNORMAL HIGH (ref 0.350–4.500)

## 2015-11-11 LAB — DIC (DISSEMINATED INTRAVASCULAR COAGULATION)PANEL
Platelets: 298 10*3/uL (ref 150–400)
Smear Review: NONE SEEN
aPTT: 41 seconds — ABNORMAL HIGH (ref 24–37)

## 2015-11-11 LAB — VITAMIN D 25 HYDROXY (VIT D DEFICIENCY, FRACTURES): Vit D, 25-Hydroxy: 34.2 ng/mL (ref 30.0–100.0)

## 2015-11-11 LAB — LACTIC ACID, PLASMA: LACTIC ACID, VENOUS: 1.6 mmol/L (ref 0.5–2.0)

## 2015-11-11 LAB — MRSA PCR SCREENING: MRSA by PCR: NEGATIVE

## 2015-11-11 LAB — APTT: APTT: 40 s — AB (ref 24–37)

## 2015-11-11 SURGERY — ABDOMINAL AORTOGRAM W/LOWER EXTREMITY

## 2015-11-11 SURGERY — AMPUTATION, ABOVE KNEE
Anesthesia: General | Site: Leg Upper | Laterality: Left

## 2015-11-11 MED ORDER — ALBUMIN HUMAN 5 % IV SOLN
INTRAVENOUS | Status: DC | PRN
  Administered 2015-11-11: 13:00:00 via INTRAVENOUS

## 2015-11-11 MED ORDER — BISACODYL 10 MG RE SUPP: 10.0000 mg | Freq: Every day | RECTAL | Status: DC | PRN

## 2015-11-11 MED ORDER — FENTANYL CITRATE (PF) 100 MCG/2ML IJ SOLN
50.0000 ug | INTRAMUSCULAR | Status: DC | PRN
  Filled 2015-11-11: qty 2

## 2015-11-11 MED ORDER — LINEZOLID 600 MG/300ML IV SOLN
600.0000 mg | Freq: Two times a day (BID) | INTRAVENOUS | Status: DC
  Administered 2015-11-11 – 2015-11-14 (×7): 600 mg via INTRAVENOUS
  Filled 2015-11-11 (×8): qty 300

## 2015-11-11 MED ORDER — SODIUM CHLORIDE 0.9 % IV SOLN
Freq: Once | INTRAVENOUS | Status: AC
  Administered 2015-11-11: 10 mL/h via INTRAVENOUS

## 2015-11-11 MED ORDER — BACITRACIN ZINC 500 UNIT/GM EX OINT
TOPICAL_OINTMENT | CUTANEOUS | Status: AC
  Filled 2015-11-11: qty 28.35

## 2015-11-11 MED ORDER — FAMOTIDINE IN NACL 20-0.9 MG/50ML-% IV SOLN
20.0000 mg | Freq: Two times a day (BID) | INTRAVENOUS | Status: DC
  Administered 2015-11-11 – 2015-11-12 (×5): 20 mg via INTRAVENOUS
  Filled 2015-11-11 (×5): qty 50

## 2015-11-11 MED ORDER — FENTANYL CITRATE (PF) 100 MCG/2ML IJ SOLN
50.0000 ug | INTRAMUSCULAR | Status: DC | PRN
  Administered 2015-11-11: 50 ug via INTRAVENOUS
  Filled 2015-11-11: qty 2

## 2015-11-11 MED ORDER — FENTANYL CITRATE (PF) 100 MCG/2ML IJ SOLN: 50.0000 ug | Freq: Once | INTRAMUSCULAR | Status: DC

## 2015-11-11 MED ORDER — MORPHINE SULFATE (PF) 2 MG/ML IV SOLN
0.5000 mg | INTRAVENOUS | Status: DC | PRN
  Administered 2015-11-14: 1 mg via INTRAVENOUS
  Filled 2015-11-11: qty 1

## 2015-11-11 MED ORDER — TRAMADOL HCL 50 MG PO TABS
50.0000 mg | ORAL_TABLET | Freq: Four times a day (QID) | ORAL | Status: DC | PRN
  Administered 2015-11-13 – 2015-11-15 (×3): 50 mg via ORAL
  Filled 2015-11-11 (×3): qty 1

## 2015-11-11 MED ORDER — PHENYLEPHRINE HCL 10 MG/ML IJ SOLN
INTRAMUSCULAR | Status: DC | PRN
  Administered 2015-11-11: 80 ug via INTRAVENOUS

## 2015-11-11 MED ORDER — 0.9 % SODIUM CHLORIDE (POUR BTL) OPTIME
TOPICAL | Status: DC | PRN
  Administered 2015-11-11: 1000 mL

## 2015-11-11 MED ORDER — FUROSEMIDE 10 MG/ML IJ SOLN
40.0000 mg | Freq: Two times a day (BID) | INTRAMUSCULAR | Status: DC
  Administered 2015-11-11 – 2015-11-12 (×4): 40 mg via INTRAVENOUS
  Filled 2015-11-11 (×4): qty 4

## 2015-11-11 MED ORDER — POTASSIUM CHLORIDE CRYS ER 20 MEQ PO TBCR
40.0000 meq | EXTENDED_RELEASE_TABLET | Freq: Every day | ORAL | Status: DC
  Administered 2015-11-11 – 2015-11-16 (×6): 40 meq via ORAL
  Filled 2015-11-11 (×6): qty 2

## 2015-11-11 MED ORDER — MIDAZOLAM HCL 5 MG/5ML IJ SOLN
INTRAMUSCULAR | Status: DC | PRN
  Administered 2015-11-11 (×2): 1 mg via INTRAVENOUS

## 2015-11-11 MED ORDER — FENTANYL CITRATE (PF) 100 MCG/2ML IJ SOLN
INTRAMUSCULAR | Status: DC | PRN
  Administered 2015-11-11 (×3): 50 ug via INTRAVENOUS

## 2015-11-11 MED ORDER — DOCUSATE SODIUM 50 MG/5ML PO LIQD: 100.0000 mg | Freq: Two times a day (BID) | ORAL | Status: DC | PRN

## 2015-11-11 MED ORDER — LACTATED RINGERS IV SOLN
INTRAVENOUS | Status: DC
  Administered 2015-11-11: 75 mL/h via INTRAVENOUS
  Administered 2015-11-11 – 2015-11-13 (×2): via INTRAVENOUS

## 2015-11-11 MED ORDER — SODIUM CHLORIDE 0.9 % IV SOLN
10.0000 mg | INTRAVENOUS | Status: DC | PRN
  Administered 2015-11-11: 10 ug/min via INTRAVENOUS

## 2015-11-11 MED ORDER — BACITRACIN ZINC 500 UNIT/GM EX OINT
TOPICAL_OINTMENT | CUTANEOUS | Status: DC | PRN
  Administered 2015-11-11: 1 via TOPICAL

## 2015-11-11 MED ORDER — FENTANYL BOLUS VIA INFUSION
25.0000 ug | INTRAVENOUS | Status: DC | PRN
  Filled 2015-11-11: qty 25

## 2015-11-11 MED ORDER — PERFLUTREN LIPID MICROSPHERE
1.0000 mL | INTRAVENOUS | Status: AC | PRN
  Administered 2015-11-11: 2 mL via INTRAVENOUS
  Filled 2015-11-11: qty 10

## 2015-11-11 MED ORDER — FUROSEMIDE 10 MG/ML IJ SOLN
60.0000 mg | Freq: Four times a day (QID) | INTRAMUSCULAR | Status: AC
  Administered 2015-11-11 (×2): 60 mg via INTRAVENOUS
  Filled 2015-11-11 (×2): qty 6

## 2015-11-11 MED ORDER — FENTANYL CITRATE (PF) 2500 MCG/50ML IJ SOLN
25.0000 ug/h | INTRAMUSCULAR | Status: DC
  Administered 2015-11-11 (×2): 50 ug/h via INTRAVENOUS
  Administered 2015-11-12: 125 ug/h via INTRAVENOUS
  Filled 2015-11-11 (×2): qty 50

## 2015-11-11 MED ORDER — MIDAZOLAM HCL 2 MG/2ML IJ SOLN
INTRAMUSCULAR | Status: AC
  Filled 2015-11-11: qty 2

## 2015-11-11 MED ORDER — FENTANYL CITRATE (PF) 250 MCG/5ML IJ SOLN
INTRAMUSCULAR | Status: AC
  Filled 2015-11-11: qty 5

## 2015-11-11 MED ORDER — DOCUSATE SODIUM 50 MG/5ML PO LIQD
100.0000 mg | Freq: Two times a day (BID) | ORAL | Status: DC | PRN
  Filled 2015-11-11: qty 10

## 2015-11-11 SURGICAL SUPPLY — 56 items
BANDAGE ACE 4X5 VEL STRL LF (GAUZE/BANDAGES/DRESSINGS) ×1 IMPLANT
BANDAGE ELASTIC 6 VELCRO ST LF (GAUZE/BANDAGES/DRESSINGS) ×2 IMPLANT
BANDAGE ESMARK 6X9 LF (GAUZE/BANDAGES/DRESSINGS) IMPLANT
BLADE SAW RECIP 87.9 MT (BLADE) ×2 IMPLANT
BNDG CMPR 9X6 STRL LF SNTH (GAUZE/BANDAGES/DRESSINGS) ×1
BNDG COHESIVE 6X5 TAN STRL LF (GAUZE/BANDAGES/DRESSINGS) ×2 IMPLANT
BNDG ESMARK 6X9 LF (GAUZE/BANDAGES/DRESSINGS) ×2
BNDG GAUZE ELAST 4 BULKY (GAUZE/BANDAGES/DRESSINGS) ×2 IMPLANT
CANISTER SUCTION 2500CC (MISCELLANEOUS) ×2 IMPLANT
CLIP TI MEDIUM 6 (CLIP) ×1 IMPLANT
COVER SURGICAL LIGHT HANDLE (MISCELLANEOUS) ×2 IMPLANT
CUFF TOURNIQUET SINGLE 18IN (TOURNIQUET CUFF) IMPLANT
CUFF TOURNIQUET SINGLE 24IN (TOURNIQUET CUFF) ×1 IMPLANT
CUFF TOURNIQUET SINGLE 34IN LL (TOURNIQUET CUFF) IMPLANT
CUFF TOURNIQUET SINGLE 44IN (TOURNIQUET CUFF) IMPLANT
DRAIN CHANNEL 19F RND (DRAIN) IMPLANT
DRAPE ORTHO SPLIT 77X108 STRL (DRAPES) ×4
DRAPE PROXIMA HALF (DRAPES) ×1 IMPLANT
DRAPE SURG ORHT 6 SPLT 77X108 (DRAPES) ×2 IMPLANT
DRAPE U-SHAPE 47X51 STRL (DRAPES) ×2 IMPLANT
DRSG ADAPTIC 3X8 NADH LF (GAUZE/BANDAGES/DRESSINGS) ×2 IMPLANT
ELECT REM PT RETURN 9FT ADLT (ELECTROSURGICAL) ×2
ELECTRODE REM PT RTRN 9FT ADLT (ELECTROSURGICAL) ×1 IMPLANT
EVACUATOR SILICONE 100CC (DRAIN) IMPLANT
GAUZE SPONGE 4X4 12PLY STRL (GAUZE/BANDAGES/DRESSINGS) ×2 IMPLANT
GLOVE BIO SURGEON STRL SZ7.5 (GLOVE) ×2 IMPLANT
GLOVE BIOGEL PI IND STRL 6.5 (GLOVE) IMPLANT
GLOVE BIOGEL PI IND STRL 7.0 (GLOVE) IMPLANT
GLOVE BIOGEL PI IND STRL 7.5 (GLOVE) IMPLANT
GLOVE BIOGEL PI IND STRL 8 (GLOVE) ×1 IMPLANT
GLOVE BIOGEL PI INDICATOR 6.5 (GLOVE) ×2
GLOVE BIOGEL PI INDICATOR 7.0 (GLOVE) ×1
GLOVE BIOGEL PI INDICATOR 7.5 (GLOVE) ×1
GLOVE BIOGEL PI INDICATOR 8 (GLOVE) ×1
GLOVE ECLIPSE 7.0 STRL STRAW (GLOVE) ×1 IMPLANT
GLOVE SURG SS PI 6.5 STRL IVOR (GLOVE) ×1 IMPLANT
GOWN STRL REUS W/ TWL LRG LVL3 (GOWN DISPOSABLE) ×3 IMPLANT
GOWN STRL REUS W/TWL LRG LVL3 (GOWN DISPOSABLE) ×6
KIT BASIN OR (CUSTOM PROCEDURE TRAY) ×2 IMPLANT
KIT ROOM TURNOVER OR (KITS) ×2 IMPLANT
NS IRRIG 1000ML POUR BTL (IV SOLUTION) ×2 IMPLANT
PACK GENERAL/GYN (CUSTOM PROCEDURE TRAY) ×2 IMPLANT
PAD ARMBOARD 7.5X6 YLW CONV (MISCELLANEOUS) ×4 IMPLANT
PADDING CAST COTTON 6X4 STRL (CAST SUPPLIES) IMPLANT
STAPLER VISISTAT (STAPLE) ×2 IMPLANT
STOCKINETTE IMPERVIOUS LG (DRAPES) ×2 IMPLANT
SUT ETHILON 3 0 PS 1 (SUTURE) IMPLANT
SUT SILK 0 TIES 10X30 (SUTURE) ×2 IMPLANT
SUT SILK 2 0 (SUTURE) ×2
SUT SILK 2 0 SH CR/8 (SUTURE) ×3 IMPLANT
SUT SILK 2-0 18XBRD TIE 12 (SUTURE) ×1 IMPLANT
SUT SILK 3 0 (SUTURE) ×2
SUT SILK 3-0 18XBRD TIE 12 (SUTURE) ×1 IMPLANT
SUT VIC AB 2-0 CT1 18 (SUTURE) ×3 IMPLANT
UNDERPAD 30X30 INCONTINENT (UNDERPADS AND DIAPERS) ×2 IMPLANT
WATER STERILE IRR 1000ML POUR (IV SOLUTION) ×2 IMPLANT

## 2015-11-11 NOTE — Consult Note (Signed)
[facility] for Infectious Disease       Reason for Consult: cellulitis, ? necrotizing    Referring Physician: Dr. Vaughan Browner  Principal Problem:   Lower extremity cellulitis Active Problems:   Diabetes mellitus ([place])   Dyslipidemia   Essential hypertension   Permanent atrial fibrillation (HCC)   Hx of CABG   Chronic anticoagulation   PVD (peripheral vascular disease) with claudication (HCC)   Necrotic toes (HCC)   Pressure ulcer   Acute pulmonary edema (HCC)   Acute congestive heart failure ([place])   Acute respiratory failure with hypoxia ([place])   . antiseptic oral rinse  7 mL Mouth Rinse QID  . chlorhexidine gluconate  15 mL Mouth Rinse BID  . clindamycin (CLEOCIN) IV  900 mg Intravenous 3 times per day  . famotidine (PEPCID) IV  20 mg Intravenous Q12H  . fentaNYL (SUBLIMAZE) injection  50 mcg Intravenous Once  . furosemide  40 mg Intravenous Q12H  . imipenem-cilastatin  500 mg Intravenous 3 times per day  . insulin aspart  0-5 Units Subcutaneous QHS  . insulin aspart  0-9 Units Subcutaneous TID WC  . metoprolol  5 mg Intravenous 4 times per day  . potassium chloride  40 mEq Oral Daily  . sodium chloride  3 mL Intravenous Q12H  . vancomycin  1,500 mg Intravenous Q24H    Recommendations: linezolid for MRSA and also has good toxin effect Continue imipenem  I will d/c vancomycin and clindamycin  Assessment: He has vascular necrosis with cellulitis, sloughing of the skin over the foot concerning for toxin-mediated process.  His MRI though did not show significant concerns for necrotizing fasciitis but the rapid course is concerning.  Linezolid will cover the infection and has a similar toxin effect as clindamycin.  I agree though that surgery is likely the best option, his vascular system has poor flow.    Antibiotics: Vancomycin, clindamycin, imipenem  HPI: [name] is a 80 y.o. male with PVD, occlusive disease on his left leg with abrupt onset of erythema of  left foot and ischemia of left toe. He was taken to the OR 1/18 and had third toe amputated and noted necrotic tissue, cellulitis, pus. Despite the surgery, his cellulitis has progressed up the shin.  No positive cultures yet.  He has been on vancomycin, imipenem and clindamycin with concern for toxin-producing organism.  MRI though did not note any gas in tissues.  With progression, he is going to have AKA this am.   MRI independently reviewed and abscess noted.   Review of Systems:  Constitutional: negative for chills Gastrointestinal: negative for diarrhea All other systems reviewed and are negative   Past Medical History  Diagnosis Date  . Arteriosclerotic cardiovascular disease (ASCVD)     CABG in 1990s; 2002 total obstruction of LAD, CX and RCA with patent grafts and nl EF; Stress nuc. 2008 - mild LV dilation; normal EF; questionable small anteroapical scar; no ischemia  . Atrial fibrillation ([place])   . Chronic anticoagulation   . Arthritis     "left knee" (11/08/2015)  . Pedal edema     "not a lot", per pt.  . Diabetic peripheral neuropathy (HCC)     bilateral lower leg, hands  . Hypertension     states under control with meds., has been on med. x "long time"  . Peripheral vascular disease ([place])   . 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 Dental crowns present   .  Runny nose 07/27/2015    clear drainage, per pt.  . Myocardial infarction ([place]) 1990s    "after my jaw OR"  . Type II diabetes mellitus ([place])     "diet controlled" (11/08/2015)  . Iron deficiency anemia   . History of blood transfusion     "related to my leg surgery?"  . Cellulitis 11/08/2015    "both feet"  . Jaw cancer ([place]) 1990s    "squamous cell"    Social History  Substance Use Topics  . Smoking status: Never Smoker   . Smokeless tobacco: Never Used  . Alcohol Use: No    Family History  Problem Relation Age of Onset  . Heart disease Mother     Allergies  Allergen  Reactions  . Lipitor [Atorvastatin] Other (See Comments)    SEVERE HEADACHE  . Simvastatin Other (See Comments)    MUSCLE ACHES  . Penicillins Rash  . Sulfa Antibiotics Rash    Physical Exam: Constitutional: in no apparent distress  Filed Vitals:   11/11/15 0815 11/11/15 0940  BP:  162/85  Pulse: 81 88  Temp: 96.3 F (35.7 C) 96.1 F (35.6 C)  Resp: 14 15  intubated, alert on vent EYES: anicteric ENMT: ET tube in place Cardiovascular: Cor RRR Respiratory: CTA B; no respiratory distress on vent GI: Bowel sounds are normal, liver is not enlarged, spleen is not enlarged Musculoskeletal: left foot with sloughing of skin on ventral side, erythema, some warmth, erythema up the shin.  Toe duscky.  Skin: negatives: no rash Hematologic: no cervical lad  Lab Results  Component Value Date   WBC 18.8* 11/11/2015   HGB 9.8* 11/11/2015   HCT 28.8* 11/11/2015   MCV 92.3 11/11/2015   PLT 265 11/11/2015    Lab Results  Component Value Date   CREATININE 1.25* 11/11/2015   BUN 27* 11/11/2015   NA 144 11/11/2015   K 3.9 11/11/2015   CL 113* 11/11/2015   CO2 20* 11/11/2015    Lab Results  Component Value Date   ALT 23 11/09/2015   AST 28 11/09/2015   ALKPHOS 110 11/09/2015     Microbiology: Recent Results (from the past 240 hour(s))  Culture, blood (routine x 2)     Status: None (Preliminary result)   Collection Time: 11/08/15 10:32 AM  Result Value Ref Range Status   Specimen Description BLOOD RIGHT ANTECUBITAL  Final   Special Requests BOTTLES DRAWN AEROBIC AND ANAEROBIC 5CC  Final   Culture NO GROWTH 1 DAY  Final   Report Status PENDING  Incomplete  Culture, blood (routine x 2)     Status: None (Preliminary result)   Collection Time: 11/08/15 11:42 AM  Result Value Ref Range Status   Specimen Description BLOOD RIGHT ANTECUBITAL  Final   Special Requests BOTTLES DRAWN AEROBIC AND ANAEROBIC 5CCS  Final   Culture NO GROWTH 2 DAYS  Final   Report Status PENDING   Incomplete    Scharlene Gloss, [place] [facility] for Infectious Disease [facility] [email or web address] [phone] pager  [phone] cell 11/11/2015, 9:45 AM

## 2015-11-11 NOTE — Progress Notes (Signed)
RT note-Patient taken to the OR by anesthesia. 

## 2015-11-11 NOTE — Progress Notes (Signed)
Echocardiogram 2D Echocardiogram with Definity has been performed.  [name] 11/11/2015, 11:24 AM

## 2015-11-11 NOTE — Op Note (Signed)
    NAME: [name]   MRN: [id number] DOB: Jun 22, 1936    DATE OF OPERATION: 11/11/2015  PREOP DIAGNOSIS: Rapidly progressive infection of left lower extremity  POSTOP DIAGNOSIS: same  PROCEDURE: Left above-the-knee amputation  SURGEON: Judeth Cornfield. Scot Dock, MD, FACS  ASSIST: Gerri Lins PA  ANESTHESIA: Gen.   EBL: minimal  INDICATIONS: [name] is a 80 y.o. male who was admitted with a diabetic foot infection. This rapidly progressed despite intravenous antibiotics. This reason was felt that the limb was not salvageable and the cellulitis that extended up to the thigh. Emergent left above-the-knee amputation was recommended.  FINDINGS: the muscle was well perfused. There were no signs of infection in the thigh except for the cellulitis in the skin.  TECHNIQUE: The patient was taken to the operative room and received a general anesthetic. The left lower extremity was prepped and draped in usual sterile fashion. A fishmouth incision was marked above the level of the patella. A tourniquet had been placed on the upper thigh. The leg was exsanguinated with an Esmarch bandage and the tourniquet inflated to 300 mmHg. Under tourniquet control, the incision was carried down to the skin, 16 of tissue, fascia and muscle to the femur which was dissected free circumferentially. The periosteum was elevated. The bone was divided proximal to the level of skin division. The femoral artery and vein were actually suture ligated with 2-0 silk ties. The tourniquet was then released. Additional hemostasis was obtained using electrocautery and 2-0 silk ties. No signs of infection in the deep tissue. Wound was then irrigated with massive sailing and the edges of the bone were rasped. The fascial layer was closed with interrupted 2-0 Vicryl's. The skin was closed with staples. A sterile dressing was applied. The patient tolerated the procedure well and was transferred to the recovery room in stable  condition. All needle and sponge counts were correct.  Deitra Mayo, MD, FACS [facility]  DATE OF DICTATION:   11/11/2015

## 2015-11-11 NOTE — Care Management Note (Signed)
Case Management Note  Patient Details  Name: [name] MRN: [id number] Date of Birth: 02-18-1936  Subjective/Objective:     Post op toe amputation on 1-18 - unable to extubated.  To ICU on vent.                 Action/Plan:   Expected Discharge Date:                  Expected Discharge Plan:  Home/Self Care  In-House Referral:     Discharge planning Services  CM Consult  Post Acute Care Choice:    Choice offered to:     DME Arranged:    DME Agency:     HH Arranged:    HH Agency:     Status of Service:  In process, will continue to follow  Medicare Important Message Given:    Date Medicare IM Given:    Medicare IM give by:    Date Additional Medicare IM Given:    Additional Medicare Important Message give by:     If discussed at [place] of Stay Meetings, dates discussed:    Additional Comments:  Vergie Living, RN 11/11/2015, 9:35 AM

## 2015-11-11 NOTE — Transfer of Care (Signed)
Immediate Anesthesia Transfer of Care Note  Patient: [name]  Procedure(s) Performed: Procedure(s): LEFT ABOVE KNEE AMPUTATION (Left)  Patient Location: PACU  Anesthesia Type:General  Level of Consciousness: awake, alert , oriented and sedated  Airway & Oxygen Therapy: Patient remains intubated per anesthesia plan  Post-op Assessment: Report given to RN, Post -op Vital signs reviewed and stable and Patient moving all extremities  Post vital signs: Reviewed and stable  Last Vitals:  Filed Vitals:   11/11/15 1100 11/11/15 1145  BP: 135/89   Pulse: 71 65  Temp:  36.1 C  Resp: 13 14    Complications: No apparent anesthesia complications

## 2015-11-11 NOTE — Progress Notes (Signed)
CRITICAL VALUE ALERT  Critical value received:  Troponin 0.51  Date of notification:  11/11/15  Time of notification:  1110  Critical value read back: yes  Nurse who received alert: Henrietta Dine RN  MD notified (1st page):  Dr. Vaughan Browner   Time of first page:  1115  Responding MD:  Dr. Vaughan Browner  Time MD responded:  1118  Will place troponins x 3 per MD

## 2015-11-11 NOTE — Progress Notes (Signed)
   VASCULAR SURGERY ASSESSMENT & PLAN:  * Pt has a rapidly progressive infection of the left lower extremity despite broad spectrum antibiotics (aztreonam and vancomycin) and drainage of abscess left foot. Now with cellulitis extending up to the thigh. I have recommended Left AKA. Discussed with CCM and we will need help from ID also.  * Elevated INR despite Vit K (2 doses) and FFP yesterday. This could be from DIC secondary to the infection. I have ordered 2 units of FFP this AM.  * I have discussed the situation with the patient (intubated, but alert) and his daughter.    SUBJECTIVE: Intubated but alert  PHYSICAL EXAM: Filed Vitals:   11/11/15 0700 11/11/15 0758 11/11/15 0800 11/11/15 0815  BP: 133/71 152/84    Pulse: 73 82 90 81  Temp:  96.1 F (35.6 C)  96.3 F (35.7 C)  TempSrc:  Axillary  Axillary  Resp: 12 14 12 14   Height:      Weight:      SpO2: 100% 100% 100% 100%   Dressing on left foot with minimal drainage. Cellulitis extends up to left thigh now.   LABS: Lab Results  Component Value Date   WBC 18.8* 11/11/2015   HGB 9.8* 11/11/2015   HCT 28.8* 11/11/2015   MCV 92.3 11/11/2015   PLT 265 11/11/2015   Lab Results  Component Value Date   CREATININE 1.25* 11/11/2015   Lab Results  Component Value Date   INR 2.28* 11/11/2015   CBG (last 3)   Recent Labs  11/10/15 2300 11/10/15 2345 11/11/15 0401  GLUCAP 135* 148* 153*   MRA: In the left lower extremity, the popliteal artery is patent below-the-knee, however there is no continuous vessel runoff below the knee. The anterior tibial artery is occluded. The tibioperoneal trunk is occluded but reconstitutes distally. The posterior tibial artery is patent beyond its origin.  Principal Problem:   Lower extremity cellulitis Active Problems:   Diabetes mellitus ([place])   Dyslipidemia   Essential hypertension   Permanent atrial fibrillation (HCC)   Hx of CABG   Chronic anticoagulation   PVD (peripheral  vascular disease) with claudication (HCC)   Necrotic toes (HCC)   Pressure ulcer   Acute pulmonary edema (HCC)   Acute congestive heart failure ([place])   Acute respiratory failure with hypoxia [facility]    Gae Gallop Beeper: [phone] 11/11/2015

## 2015-11-11 NOTE — Anesthesia Postprocedure Evaluation (Signed)
Anesthesia Post Note  Patient: [name]  Procedure(s) Performed: Procedure(s) (LRB): INCISION AND DRAINAGE LEFT FOOT, AMPUTATION OF LEFT THIRD TOE (Left)  Patient location during evaluation: ICU Anesthesia Type: General Level of consciousness: sedated Pain management: pain level controlled Vital Signs Assessment: post-procedure vital signs reviewed and stable Respiratory status: patient remains intubated per anesthesia plan Cardiovascular status: stable and tachycardic Postop Assessment: no signs of nausea or vomiting Anesthetic complications: no    Last Vitals:  Filed Vitals:   11/11/15 1000 11/11/15 1001  BP:  144/90  Pulse: 85 92  Temp:  36 C  Resp: 14 15    Last Pain:  Filed Vitals:   11/11/15 1047  PainSc: 0-No pain                 Jeanmarc Viernes

## 2015-11-11 NOTE — Progress Notes (Signed)
RT note- patient remains on current setting, with a increase in peep and fio2 for sp0s2 decreased to 88%, continue to monitor.

## 2015-11-11 NOTE — Anesthesia Preprocedure Evaluation (Signed)
Anesthesia Evaluation  Patient identified by MRN, date of birth, ID band Patient awake  General Assessment Comment:Short of breath and anxious, restless, can't lay flat  Reviewed: Allergy & Precautions, NPO status , Patient's Chart, lab work & pertinent test results  History of Anesthesia Complications Negative for: history of anesthetic complications  Airway Mallampati: III  TM Distance: >3 FB Neck ROM: Limited  Mouth opening: Limited Mouth Opening  Dental no notable dental hx.    Pulmonary shortness of breath,     + wheezing      Cardiovascular hypertension, Pt. on medications and Pt. on home beta blockers (-) angina+ CAD, + Past MI, + CABG, + Peripheral Vascular Disease and +CHF  + dysrhythmias Atrial Fibrillation  Rhythm:Irregular Rate:Tachycardia     Neuro/Psych neg Seizures  Neuromuscular disease negative psych ROS   GI/Hepatic negative GI ROS, Neg liver ROS,   Endo/Other  diabetes, Type 2  Renal/GU ARFRenal disease     Musculoskeletal  (+) Arthritis ,   Abdominal   Peds  Hematology  (+) anemia ,   Anesthesia Other Findings   Reproductive/Obstetrics                             Anesthesia Physical Anesthesia Plan  ASA: IV  Anesthesia Plan: General   Post-op Pain Management:    Induction: Intravenous  Airway Management Planned: Oral ETT  Additional Equipment:   Intra-op Plan:   Post-operative Plan: Post-operative intubation/ventilation  Informed Consent: I have reviewed the patients History and Physical, chart, labs and discussed the procedure including the risks, benefits and alternatives for the proposed anesthesia with the patient or authorized representative who has indicated his/her understanding and acceptance.     Plan Discussed with: CRNA and Surgeon  Anesthesia Plan Comments:         Anesthesia Quick Evaluation

## 2015-11-11 NOTE — Consult Note (Signed)
Name: [name] MRN: [id number] DOB: 28-May-1936    LOS: 3  Referring Provider:  Dr. Ermalene Postin Reason for Referral:  Acute respiratory distress requiring intubation  PULMONARY / CRITICAL CARE MEDICINE  HPI: 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 He has apparently done well during hospital stay, saturating well on room air, normotensive, with rate controlled Afib. In anticipation of angiography and surgery, his warfarin was reversed with Vitamin K. On 1/18, he was taken to the OR for amputation of the third left toe with I&D. In the OR, patient became tachypneic with RR in 30s, desatting to mid-70s, and tachycardic to rate of 120s. He required urgent intubation for hypoxemia and pulmonary edema. Afterwards he was transferred to the surgical ICU. He does not have documented history of CHF and most recent echo on file from 2006 shows EF of 45-50%. CXR shows cardiomegaly and bilateral opacities suggestive of pulmonary edema. PCCM was consulted for vent management.   Past Medical History  Diagnosis Date  . Arteriosclerotic cardiovascular disease (ASCVD)     CABG in 1990s; 2002 total obstruction of LAD, CX and RCA with patent grafts and nl EF; Stress nuc. 2008 - mild LV dilation; normal EF; questionable small anteroapical scar; no ischemia  . Atrial fibrillation ([place])   . Chronic anticoagulation   . Arthritis     "left knee" (11/08/2015)  . Pedal edema     "not a lot", per pt.  . Diabetic peripheral neuropathy (HCC)     bilateral lower leg, hands  . Hypertension     states under control with meds., has been on med. x "long time"  . Peripheral vascular disease ([place])   . 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 Dental crowns present   . Runny nose  07/27/2015    clear drainage, per pt.  . Myocardial infarction ([place]) 1990s    "after my jaw OR"  . Type II diabetes mellitus ([place])     "diet controlled" (11/08/2015)  . Iron deficiency anemia   . History of blood transfusion     "related to my leg surgery?"  . Cellulitis 11/08/2015    "both feet"  . Jaw cancer ([place]) 1990s    "squamous cell"   Past Surgical History  Procedure Laterality Date  . Squamous cell carcinoma excision Left 1993    "took my jaw out; got cadavar in there now"  . Tonsillectomy    . Orif tibia fracture Left ~ 1970  . Colonoscopy N/A 11/27/2014    Procedure: COLONOSCOPY;  Surgeon: Rogene Houston, MD;  Location: [facility];  Service: Endoscopy;  Laterality: N/A;  830  . Incision and drainage abscess Left 06/16/2005    wide exc. abscess 5th toe  . Mandibular hardware removal Left 08/02/2015    Procedure:  HARDWARE REMOVAL TWO MANDIBULAR SCREWS;  Surgeon: Jannette Fogo, DDS;  Location: [facility];  Service: Oral Surgery;  Laterality: Left;  . Fracture surgery    . Cataract extraction w/ intraocular lens  implant, bilateral Bilateral 09/2015  . Cardiac catheterization  1990s; 04/16/2001  . Coronary artery bypass graft  1990's    "CABG X4"   Prior to Admission medications   Medication Sig Start Date  End Date Taking? Authorizing Provider  amLODipine (NORVASC) 10 MG tablet Take 1 tablet (10 mg total) by mouth daily. 03/01/15  Yes Herminio Commons, MD  aspirin 81 MG tablet Take 81 mg by mouth daily.   Yes Historical Provider, MD  cloNIDine (CATAPRES - DOSED IN MG/24 HR) 0.3 mg/24hr patch APPLY 1 PATCH ONCE WEEKLY 03/24/15  Yes Herminio Commons, MD  Difluprednate (DUREZOL) 0.05 % EMUL Apply 1 drop to eye 2 (two) times daily.   Yes Historical Provider, MD  ferrous sulfate 325 (65 FE) MG tablet Take 65 mg by mouth daily with breakfast.   Yes Historical Provider, MD  fish oil-omega-3 fatty acids 1000 MG capsule Take 1 capsule by mouth daily.     Yes  Historical Provider, MD  JANTOVEN 2.5 MG tablet TAKE 1 TABLET DAILY OR AS  DIRECTED BY COUMADIN CLINIC 02/09/15  Yes Herminio Commons, MD  losartan-hydrochlorothiazide (HYZAAR) 100-25 MG per tablet Take 1 tablet by mouth daily.     Yes Historical Provider, MD  Multiple Vitamins-Minerals (CENTRUM SILVER PO) Take 1 tablet by mouth daily.     Yes Historical Provider, MD  nebivolol (BYSTOLIC) 5 MG tablet Take 1 tablet (5 mg total) by mouth daily. 05/28/15  Yes Herminio Commons, MD  Probiotic Product ([place] PO) Take 1 capsule by mouth daily.    Yes Historical Provider, MD   Allergies Allergies  Allergen Reactions  . Lipitor [Atorvastatin] Other (See Comments)    SEVERE HEADACHE  . Simvastatin Other (See Comments)    MUSCLE ACHES  . Penicillins Rash  . Sulfa Antibiotics Rash    Family History Family History  Problem Relation Age of Onset  . Heart disease Mother    Social History  reports that he has never smoked. He has never used smokeless tobacco. He reports that he does not drink alcohol or use illicit drugs.  Review Of Systems:  Unable to obtain as patient is sedated on vent  Studies: 1/16 L. Foot xray > no acute bony findings or destructive changes 1/18 MRI L. Foot > diffuse cellulitis of left foot, abscess at third metatarsal, no findings for osteonecrosis 1/18 MRA LLE > patent popliteal with poor blood flow below knee, occluded anterior tibial artery 1/18 CXR > cardiomegaly and bilateral opacities suggestive of pulmonary edema  Cultures: 1/16 Blood x2 >> 1/18 abscess > 1/18 anaerobic > 1/18 abscess gram stain >  Antibiotics: 1/16 Aztreonam >> 1/17 1/16 Vancomycin >> 1/18 Clinda >> 1/18 Imipenem-Cilastatin >>  Events Since Admission: 1/18 Taken to OR for L 3rd toe amputation and I&D, developed acute respiratory distress requiring intubation.  Current Status:  Vital Signs: Temp:  [96.1 F (35.6 C)-99.4 F (37.4 C)] 96.3 F (35.7 C) (01/19  0815) Pulse Rate:  [70-104] 81 (01/19 0815) Resp:  [11-24] 14 (01/19 0815) BP: (122-154)/(55-99) 152/84 mmHg (01/19 0758) SpO2:  [87 %-100 %] 100 % (01/19 0815) Arterial Line BP: (120-170)/(59-82) 150/69 mmHg (01/19 0815) FiO2 (%):  [70 %-100 %] 90 % (01/19 0800) Weight:  [198 lb 6.6 oz (90 kg)] 198 lb 6.6 oz (90 kg) (01/18 2200)  Vent Settings: Vent Mode:  [-] PRVC FiO2 (%):  [70 %-100 %] 90 % Set Rate:  [12 bmp] 12 bmp Vt Set:  [600 mL] 600 mL PEEP:  [10 cmH20] 10 cmH20 Plateau Pressure:  [15 cmH20-22 cmH20] 15 cmH20   Physical Examination: General:  Awake on vent. No apparent distress Neuro:  Moves all 4 extremities. No gross  focal deficits. HEENT: Moist mucus membranes. No JVDD Cardiovascular:  Irregularly irregular, No MRG Lungs:  Clear anter.No wheeze or crackles. Abdomen:  Soft, non-distended, +BS Musculoskeletal:  Left foot bandaged, erythema LLE up to the knee  Principal Problem:   Lower extremity cellulitis Active Problems:   Diabetes mellitus ([place])   Dyslipidemia   Essential hypertension   Permanent atrial fibrillation (HCC)   Hx of CABG   Chronic anticoagulation   PVD (peripheral vascular disease) with claudication (HCC)   Necrotic toes (HCC)   Pressure ulcer   Acute pulmonary edema (HCC)   Acute congestive heart failure (HCC)   Acute respiratory failure with hypoxia ([place])   Brief patient description:  80 y/o male with PMH of CAD s/p CABG, chronic Afib on warfarin, T2DM with neuropathy, HTN, PVD. On 1/18, he was taken to the OR for amputation of the third left toe with I&D and required urgent intubation due to hypoxemia and pulmonary edema.  ASSESSMENT AND PLAN  PULMONARY A: Acute respiratory failure with hypoxemia requiring mechanical ventilation 2/2 Acute pulmonary edema P:   Continue full vent support, wean PEEP/FiO2 as tolerated Reduce TV to 8cc/kg. Holding SBT as plan is for repeat OR today.  CARDIOVASCULAR A: CAD s/p CABG Chronic Afib on  warfarin HTN PVD Likely acute decompensated CHF, no recent Echo P:  Continue lasix diuresis Check CVP. Goal 8-14. On Metoprolol 5 mg q6h  RENAL A: AKI Hypokalemia Hypomagnesemia Hypocalcemia P:   Monitor BMET and UOP Replace electrolytes as needed K supplement Check Mg, Phos  GASTROINTESTINAL A: GI prophylaxis P:   Famotidine NPO  HEMATOLOGIC A: Chronic anticoagulation with warfarin for Afib Leukocytosis DVT ppx with warfarin/heparin on hold P:  Hold warfarin. Reverse INR with 2 units FFP today AM Check DIC panel   INFECTIOUS A: Left foot gangrene/necrosis s/p amputation of 3rd left toe and I&D LLE cellulitis extending to knee P:   Continue abx as above ID consult. Plan to take back to OR today for either AKA/BKA.  ENDOCRINE A: T2DM with peripheral neuropathy P:   Continue SSI  NEUROLOGIC A: Vent sedation P:   Fentanyl gtt. RASS goal -1  Critical care time- 35 mins.  Marshell Garfinkel MD [facility] Pager [phone] If no answer or after 3pm call: [phone] 11/11/2015, 9:07 AM

## 2015-11-11 NOTE — Progress Notes (Signed)
Patient ID: [name], male   DOB: 05-Dec-1935, 80 y.o.   MRN: [id number] Medicine attending: We greatly appreciate prompt assistance from vascular surgery and critical care medicine. Over the course of the day yesterday, there was rapid progression of ischemic changes of his left foot. He was taken for urgent amputation of the left third toe. Perioperative oxygen saturations fell. Chest x-ray suggested acute pulmonary edema. BNP elevated at 1000 with no baseline. He had received 2 units of fresh frozen plasma but total volume from this only about 400 mL's. He was electively intubated. Surgery proceeded without incident. Frank pus found when the skin was incised. Third toe amputation and drainage of abscess performed. His overall condition stabilized overnight however there were progressive changes of ascending cellulitis of his left lower extremity. He was taken back to the operating room and a above-the-knee amputation was just performed. I met with multiple family members prior to today's surgery to review the patient's status and reasons for proceeding with more definitive surgery. He was seen by infectious disease and we discussed antibiotic changes with the consultant. He remained intubated at the time we saw him preop but was awake, alert, and in good spirits. We also considered that his persistently elevated pro time despite multiple doses of vitamin K and fresh frozen plasma was related to incipient DIC related to tissue necrosis from his ischemic limb. Alternatively, he could have a inhibitor to coagulation. Since no bleeding was encountered during last evening surgery, if there is an inhibitor present, it is likely a nonspecific lupus type anticoagulant. DIC panel shows fibrinogen greater than 800. D-dimer nonspecifically elevated in view of acute inflammation and surgery therefore not helpful. Plasma mixing studies to look for an inhibitor to coagulation in progress.

## 2015-11-11 NOTE — Progress Notes (Signed)
[facility]  [name] was seen and examined with the residents today, their note is pending.  He has a history of CAD (s/p CABG) and peripheral vascular disease and was admitted for a dead left third toe two days ago.  He had an abscess around the toe, MRA showed poor blood flow in the left leg below the knee.  He went for a left third toe amputation and abscess drainage tonight and was brought to the SICU on a vent because of pulmonary edema and hypoxemia noted around the time of surgery.  Notably, throughout his hospital stay he has not had hypoxemia until tonight.  His family states that they are unaware of a diagnosis of CHF.  On exam Sedated on vent Lungs with few crackles bases, vent supported breaths CV: Irreg irreg on my exam, bounding PMI, no clear murmur GI: belly soft, nontender MSK: R foot warm, left foot bandaged, redness left leg anteriorly extending to knee, faintly palpable popliteal pulse knee  Labs reviewed, Cr 1.37, this morning's K 2.8 WBC 22  U/S 1/17 no evidence of DVT CXR 1/18 > bilateral airspace disease, cephalization, cardiomegaly, kerley B lines, ett in place, R subclavian CVL in place  Impression/Plan: Acute respiratory failure with hypoxemia due to acute pulmonary edema likely due to acute decompensated CHF, unknown systolic function> diurese now, continue full vent support, cycle cardiac enzymes, check EKG, check echo, check ABG and adjust vent appropriately to maintain O2 saturation > 90%; vent sedation protocol with PAD protocol titrated to RASS -1 with intermittent fentanyl, add stress ulcer prophylaxis with famotidine Afib with RVR> add metoprolol IV now, restart warfarin/heparin when OK by vascular Hypokalemia> give KCL now, repeat BMET now Gangrene left foot> watch leg closely, f/u with vascular, no note of necrotizing fasciitis on MRI or noted in OP report, just gangrene with expected necrotic tissue.  If redness in exam rapidly spreads may need further  debridement or amputation. DVT prophylaxis by meds when OK by vascular  I updated the family at bedside  My cc time 40 minutes  Roselie Awkward, MD [facility] Pager: [phone] Cell: [phone] After 3pm or if no response, call [phone]

## 2015-11-11 NOTE — Anesthesia Postprocedure Evaluation (Signed)
Anesthesia Post Note  Patient: [name]  Procedure(s) Performed: Procedure(s) (LRB): LEFT ABOVE KNEE AMPUTATION (Left)  Patient location during evaluation: SICU Anesthesia Type: General Level of consciousness: sedated and patient remains intubated per anesthesia plan Pain management: pain level controlled Vital Signs Assessment: post-procedure vital signs reviewed and stable Respiratory status: patient remains intubated per anesthesia plan and patient on ventilator - see flowsheet for VS Cardiovascular status: stable Anesthetic complications: no    Last Vitals:  Filed Vitals:   11/11/15 1500 11/11/15 1610  BP: 142/79 144/75  Pulse: 84 94  Temp:    Resp: 14 16    Last Pain:  Filed Vitals:   11/11/15 1616  PainSc: 0-No pain                 Howard Patton,JAMES TERRILL

## 2015-11-12 ENCOUNTER — Encounter (HOSPITAL_COMMUNITY): Payer: Self-pay | Admitting: Vascular Surgery

## 2015-11-12 ENCOUNTER — Inpatient Hospital Stay (HOSPITAL_COMMUNITY): Payer: Medicare Other

## 2015-11-12 DIAGNOSIS — R791 Abnormal coagulation profile: Secondary | ICD-10-CM

## 2015-11-12 LAB — MAGNESIUM
MAGNESIUM: 1.8 mg/dL (ref 1.7–2.4)
MAGNESIUM: 1.6 mg/dL — AB (ref 1.7–2.4)

## 2015-11-12 LAB — PREPARE FRESH FROZEN PLASMA
UNIT DIVISION: 0
Unit division: 0

## 2015-11-12 LAB — BASIC METABOLIC PANEL
Anion gap: 12 (ref 5–15)
Anion gap: 10 (ref 5–15)
BUN: 30 mg/dL — AB (ref 6–20)
BUN: 31 mg/dL — AB (ref 6–20)
CALCIUM: 7.7 mg/dL — AB (ref 8.9–10.3)
CHLORIDE: 105 mmol/L (ref 101–111)
CO2: 26 mmol/L (ref 22–32)
CO2: 28 mmol/L (ref 22–32)
CREATININE: 1.18 mg/dL (ref 0.61–1.24)
CREATININE: 1.24 mg/dL (ref 0.61–1.24)
Calcium: 7.5 mg/dL — ABNORMAL LOW (ref 8.9–10.3)
Chloride: 108 mmol/L (ref 101–111)
GFR calc Af Amer: >60 mL/min (ref >60)
GFR calc Af Amer: >60 mL/min (ref >60)
GFR calc non Af Amer: 54 mL/min — ABNORMAL LOW (ref >60)
GFR, EST NON AFRICAN AMERICAN: 58 mL/min — AB (ref >60)
GLUCOSE: 109 mg/dL — AB (ref 65–99)
Glucose, Bld: 162 mg/dL — ABNORMAL HIGH (ref 65–99)
POTASSIUM: 3.3 mmol/L — AB (ref 3.5–5.1)
Potassium: 3.4 mmol/L — ABNORMAL LOW (ref 3.5–5.1)
SODIUM: 146 mmol/L — AB (ref 135–145)
SODIUM: 143 mmol/L (ref 135–145)

## 2015-11-12 LAB — POCT I-STAT 7, (LYTES, BLD GAS, ICA,H+H)
ACID-BASE DEFICIT: 9 mmol/L — AB (ref 0.0–2.0)
BICARBONATE: 17.9 meq/L — AB (ref 20.0–24.0)
CALCIUM ION: 0.97 mmol/L — AB (ref 1.13–1.30)
HEMATOCRIT: 31 % — AB (ref 39.0–52.0)
HEMOGLOBIN: 10.5 g/dL — AB (ref 13.0–17.0)
O2 Saturation: 92 %
PH ART: 7.228 — AB (ref 7.350–7.450)
POTASSIUM: 3.5 mmol/L (ref 3.5–5.1)
SODIUM: 145 mmol/L (ref 135–145)
TCO2: 19 mmol/L (ref 0–100)
pCO2 arterial: 43 mmHg (ref 35.0–45.0)
pO2, Arterial: 77 mmHg — ABNORMAL LOW (ref 80.0–100.0)

## 2015-11-12 LAB — GLUCOSE, CAPILLARY
GLUCOSE-CAPILLARY: 91 mg/dL (ref 65–99)
GLUCOSE-CAPILLARY: 163 mg/dL — AB (ref 65–99)
Glucose-Capillary: 97 mg/dL (ref 65–99)
Glucose-Capillary: 147 mg/dL — ABNORMAL HIGH (ref 65–99)

## 2015-11-12 LAB — CBC
HCT: 29.4 % — ABNORMAL LOW (ref 39.0–52.0)
Hemoglobin: 9.9 g/dL — ABNORMAL LOW (ref 13.0–17.0)
MCH: 31.6 pg (ref 26.0–34.0)
MCHC: 33.7 g/dL (ref 30.0–36.0)
MCV: 93.9 fL (ref 78.0–100.0)
PLATELETS: 300 10*3/uL (ref 150–400)
RBC: 3.13 MIL/uL — AB (ref 4.22–5.81)
RDW: 14.4 % (ref 11.5–15.5)
WBC: 11.3 10*3/uL — ABNORMAL HIGH (ref 4.0–10.5)

## 2015-11-12 LAB — TROPONIN I
Troponin I: 0.3 ng/mL — ABNORMAL HIGH (ref <0.031)
Troponin I: 0.39 ng/mL — ABNORMAL HIGH (ref <0.031)

## 2015-11-12 LAB — PHOSPHORUS: Phosphorus: 4 mg/dL (ref 2.5–4.6)

## 2015-11-12 MED ORDER — POLYVINYL ALCOHOL 1.4 % OP SOLN
1.0000 [drp] | OPHTHALMIC | Status: DC | PRN
  Administered 2015-11-12 – 2015-11-13 (×2): 1 [drp] via OPHTHALMIC
  Filled 2015-11-12: qty 15

## 2015-11-12 MED ORDER — PRO-STAT SUGAR FREE PO LIQD
30.0000 mL | Freq: Two times a day (BID) | ORAL | Status: DC
  Administered 2015-11-12 – 2015-11-16 (×6): 30 mL via ORAL
  Filled 2015-11-12 (×6): qty 30

## 2015-11-12 MED ORDER — POTASSIUM CHLORIDE 10 MEQ/50ML IV SOLN
10.0000 meq | INTRAVENOUS | Status: AC
  Administered 2015-11-12 (×4): 10 meq via INTRAVENOUS
  Filled 2015-11-12 (×4): qty 50

## 2015-11-12 MED ORDER — ONDANSETRON HCL 4 MG/2ML IJ SOLN
4.0000 mg | Freq: Four times a day (QID) | INTRAMUSCULAR | Status: DC | PRN
Start: 2015-11-12 — End: 2015-11-15
  Administered 2015-11-12 – 2015-11-14 (×2): 4 mg via INTRAVENOUS
  Filled 2015-11-12 (×3): qty 2

## 2015-11-12 MED ORDER — POTASSIUM CHLORIDE 10 MEQ/100ML IV SOLN
10.0000 meq | INTRAVENOUS | Status: AC
Start: 2015-11-12 — End: 2015-11-12
  Administered 2015-11-12 (×4): 10 meq via INTRAVENOUS
  Filled 2015-11-12: qty 100

## 2015-11-12 MED ORDER — BOOST / RESOURCE BREEZE PO LIQD
1.0000 | Freq: Three times a day (TID) | ORAL | Status: DC
  Administered 2015-11-12 – 2015-11-16 (×9): 1 via ORAL

## 2015-11-12 MED ORDER — MAGNESIUM SULFATE 2 GM/50ML IV SOLN
2.0000 g | Freq: Once | INTRAVENOUS | Status: AC
  Administered 2015-11-12: 2 g via INTRAVENOUS
  Filled 2015-11-12: qty 50

## 2015-11-12 NOTE — Progress Notes (Signed)
eLink Physician-Brief Progress Note Patient Name: [name] DOB: 1935-12-05 MRN: [id number]   Date of Service  11/12/2015  HPI/Events of Note  Elevated trop was a lab error  eICU Interventions  Plan: D/C cycling trop     Intervention Category Evaluation Type: Other  [name] 11/12/2015, 5:38 AM

## 2015-11-12 NOTE — Procedures (Signed)
Extubation Procedure Note  Patient Details:   Name: [name] DOB: 04-Aug-1936 MRN: [id number]   Airway Documentation:  Airway 8 mm (Active)  Secured at (cm) 24 cm 11/12/2015  8:07 AM  Measured From Lips 11/12/2015  8:07 AM  Secured Location Right 11/12/2015  8:07 AM  Secured By [employer] 11/12/2015  8:07 AM  Tube Holder Repositioned Yes 11/12/2015  8:07 AM  Cuff Pressure (cm H2O) 24 cm H2O 11/11/2015  8:21 PM  Site Condition Dry 11/12/2015  3:32 AM    Evaluation  O2 sats: stable throughout and currently acceptable Complications: No apparent complications Patient did tolerate procedure well. Bilateral Breath Sounds: Clear, Diminished Suctioning: Airway Yes    Miquel Dunn 11/12/2015, 10:10 AM

## 2015-11-12 NOTE — Progress Notes (Signed)
Dr. Corinna Lines notified of patient having multiple PVCs with pauses, MD order to check K+ and Mg now, will continue to monitor. [name] 4:22 PM

## 2015-11-12 NOTE — Progress Notes (Signed)
[facility] for Infectious Disease   Reason for visit: Follow up on cellulitis  Interval History: s/p AKA yesterday for rapid infection spread. WBC much improved.  No pain, anxious to get extubated.  Gram stain with GPC in pairs  Physical Exam: Constitutional:  Filed Vitals:   11/12/15 0700 11/12/15 0752  BP: 141/74   Pulse: 69   Temp:  97.3 F (36.3 C)  Resp: 14    patient appears in NAD, alert Respiratory: Normal respiratory effort; CTA B; on vent Cardiovascular: RRR MS: left leg now s/p AKA, wrapped  Review of Systems: Constitutional: negative for fevers and chills Gastrointestinal: negative for diarrhea and ROS done with alert pt on Vent  Lab Results  Component Value Date   WBC 11.3* 11/12/2015   HGB 9.9* 11/12/2015   HCT 29.4* 11/12/2015   MCV 93.9 11/12/2015   PLT 300 11/12/2015    Lab Results  Component Value Date   CREATININE 1.18 11/12/2015   BUN 30* 11/12/2015   NA 146* 11/12/2015   K 3.3* 11/12/2015   CL 108 11/12/2015   CO2 26 11/12/2015    Lab Results  Component Value Date   ALT 39 11/11/2015   AST 37 11/11/2015   ALKPHOS 131* 11/11/2015     Microbiology: Recent Results (from the past 240 hour(s))  Culture, blood (routine x 2)     Status: None (Preliminary result)   Collection Time: 11/08/15 10:32 AM  Result Value Ref Range Status   Specimen Description BLOOD RIGHT ANTECUBITAL  Final   Special Requests BOTTLES DRAWN AEROBIC AND ANAEROBIC 5CC  Final   Culture NO GROWTH 2 DAYS  Final   Report Status PENDING  Incomplete  Culture, blood (routine x 2)     Status: None (Preliminary result)   Collection Time: 11/08/15 11:42 AM  Result Value Ref Range Status   Specimen Description BLOOD RIGHT ANTECUBITAL  Final   Special Requests BOTTLES DRAWN AEROBIC AND ANAEROBIC 5CCS  Final   Culture NO GROWTH 3 DAYS  Final   Report Status PENDING  Incomplete  Anaerobic culture     Status: None (Preliminary result)   Collection Time: 11/10/15  9:44 PM    Result Value Ref Range Status   Specimen Description ABSCESS LEFT FOOT  Final   Special Requests THIRD METATARSAL  Final   Gram Stain   Final    MODERATE WBC PRESENT, PREDOMINANTLY PMN NO SQUAMOUS EPITHELIAL CELLS SEEN ABUNDANT GRAM POSITIVE COCCI IN PAIRS Performed at [facility]    Culture PENDING  Incomplete   Report Status PENDING  Incomplete  Culture, routine-abscess     Status: None (Preliminary result)   Collection Time: 11/10/15  9:44 PM  Result Value Ref Range Status   Specimen Description ABSCESS LEFT FOOT  Final   Special Requests THIRD METATARSAL  Final   Gram Stain   Final    ABUNDANT WBC PRESENT,BOTH PMN AND MONONUCLEAR NO SQUAMOUS EPITHELIAL CELLS SEEN MODERATE GRAM POSITIVE COCCI IN PAIRS Performed at [facility]    Culture   Final    Culture reincubated for better growth Performed at [facility]    Report Status PENDING  Incomplete  MRSA PCR Screening     Status: None   Collection Time: 11/11/15 10:10 AM  Result Value Ref Range Status   MRSA by PCR NEGATIVE NEGATIVE Final    Comment:        The GeneXpert MRSA Assay (FDA approved for NASAL specimens only), is  one component of a comprehensive MRSA colonization surveillance program. It is not intended to diagnose MRSA infection nor to guide or monitor treatment for MRSA infections.     Impression/Plan:  1. Cellulitis, possible type I necrotizing fasciitis - now amputation.  May have been toxin producing organism like GAS with gram stain noted.  On linezolid for coverage and toxin effect.  Also imipenem pending ID.  Is penicillin allergic.  No indication at this time for IVIg with amputation, improved WBC, afebrile.  2. Increased INR/PT - unclear, may be non specific lupus type anticoagulant.  No significant bleeding noted.

## 2015-11-12 NOTE — Progress Notes (Signed)
[facility] Progress Note Patient Name: [name] DOB: 20-May-1936 MRN: [id number]   Date of Service  11/12/2015  HPI/Events of Note  Multiple issues: 1. K+ = 3.4, Mg++ = 1.6 and Creatinine = 1.24 and 2. N/V.  eICU Interventions  Will order: 1. Repleat K+ and Mg++. 2. Zofran 4 mg IV Q 6 hours PRN N/V.     Intervention Category Intermediate Interventions: Electrolyte abnormality - evaluation and management;Other:  Lysle Dingwall 11/12/2015, 5:45 PM

## 2015-11-12 NOTE — Progress Notes (Signed)
   VASCULAR SURGERY ASSESSMENT & PLAN:  * 1 Day Post-Op s/p: left AKA  * Dressing change tomorrow.    * ID: on IV Primaxin and Zyvox.  OR gram stain has abundant gram positive cocci. Blood culture- no growth for 3 days so far.   SUBJECTIVE: Sedated on vent  PHYSICAL EXAM: Filed Vitals:   11/12/15 0500 11/12/15 0600 11/12/15 0700 11/12/15 0752  BP: 151/125 164/78 141/74   Pulse: 41 84 69   Temp:    97.3 F (36.3 C)  TempSrc:    Oral  Resp: 18 14 14    Height:      Weight:  177 lb 11.1 oz (80.6 kg)    SpO2: 94% 99% 92%    Dressing on left AKA is dry  LABS: Lab Results  Component Value Date   WBC 11.3* 11/12/2015   HGB 9.9* 11/12/2015   HCT 29.4* 11/12/2015   MCV 93.9 11/12/2015   PLT 300 11/12/2015   Lab Results  Component Value Date   CREATININE 1.18 11/12/2015   Lab Results  Component Value Date   INR 1.98* 11/11/2015   CBG (last 3)   Recent Labs  11/11/15 1228 11/11/15 1638 11/11/15 2141  GLUCAP 116* 109* 99    Principal Problem:   Lower extremity cellulitis Active Problems:   Diabetes mellitus ([place])   Dyslipidemia   Essential hypertension   Permanent atrial fibrillation (HCC)   Hx of CABG   Chronic anticoagulation   PVD (peripheral vascular disease) with claudication (HCC)   Necrotic toes (HCC)   Pressure ulcer   Acute pulmonary edema (HCC)   Acute congestive heart failure ([place])   Acute respiratory failure with hypoxia [facility])    Gae Gallop Beeper: [phone] 11/12/2015

## 2015-11-12 NOTE — Consult Note (Signed)
Name: [name] MRN: [id number] DOB: 1936/08/23    LOS: 4  Referring Provider:  Dr. 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 He has apparently done well during hospital stay, saturating well on room air, normotensive, with rate controlled Afib. In anticipation of angiography and surgery, his warfarin was reversed with Vitamin K. On 1/18, he was taken to the OR for amputation of the third left toe with I&D. In the OR, patient became tachypneic with RR in 30s, desatting to mid-70s, and tachycardic to rate of 120s. He required urgent intubation for hypoxemia and pulmonary edema. Afterwards he was transferred to the surgical ICU. He does not have documented history of CHF and most recent echo on file from 2006 shows EF of 45-50%. CXR shows cardiomegaly and bilateral opacities suggestive of pulmonary edema. PCCM was consulted for vent management.   Past Medical History  Diagnosis Date  . Arteriosclerotic cardiovascular disease (ASCVD)     CABG in 1990s; 2002 total obstruction of LAD, CX and RCA with patent grafts and nl EF; Stress nuc. 2008 - mild LV dilation; normal EF; questionable small anteroapical scar; no ischemia  . Atrial fibrillation ([place])   . Chronic anticoagulation   . Arthritis     "left knee" (11/08/2015)  . Pedal edema     "not a lot", per pt.  . Diabetic peripheral neuropathy (HCC)     bilateral lower leg, hands  . Hypertension     states under control with meds., has been on med. x "long time"  . Peripheral vascular disease ([place])   . 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 Dental crowns present   . Runny nose  07/27/2015    clear drainage, per pt.  . Myocardial infarction ([place]) 1990s    "after my jaw OR"  . Type II diabetes mellitus ([place])     "diet controlled" (11/08/2015)  . Iron deficiency anemia   . History of blood transfusion     "related to my leg surgery?"  . Cellulitis 11/08/2015    "both feet"  . Jaw cancer ([place]) 1990s    "squamous cell"   Past Surgical History  Procedure Laterality Date  . Squamous cell carcinoma excision Left 1993    "took my jaw out; got cadavar in there now"  . Tonsillectomy    . Orif tibia fracture Left ~ 1970  . Colonoscopy N/A 11/27/2014    Procedure: COLONOSCOPY;  Surgeon: Rogene Houston, MD;  [facility];  Service: Endoscopy;  Laterality: N/A;  830  . Incision and drainage abscess Left 06/16/2005    wide exc. abscess 5th toe  . Mandibular hardware removal Left 08/02/2015    Procedure:  HARDWARE REMOVAL TWO MANDIBULAR SCREWS;  Surgeon: Jannette Fogo, DDS;  Location: [place] [facility];  Service: Oral Surgery;  Laterality: Left;  . Fracture surgery    . Cataract extraction w/ intraocular lens  implant, bilateral Bilateral 09/2015  . Cardiac catheterization  1990s; 04/16/2001  . Coronary artery bypass graft  1990's    "CABG X4"  . I&d extremity Left 11/10/2015    Procedure: INCISION AND  DRAINAGE LEFT FOOT, AMPUTATION OF LEFT THIRD TOE;  Surgeon: Conrad [place]side [place], MD;  Location: [place] [facility];  Service: Vascular;  Laterality: Left;   Prior to Admission medications   Medication Sig Start Date End Date Taking? Authorizing Provider  amLODipine (NORVASC) 10 MG tablet Take 1 tablet (10 mg total) by mouth daily. 03/01/15  Yes Herminio Commons, MD  aspirin 81 MG tablet Take 81 mg by mouth daily.   Yes Historical Provider, MD  cloNIDine (CATAPRES - DOSED IN MG/24 HR) 0.3 mg/24hr patch APPLY 1 PATCH ONCE WEEKLY 03/24/15  Yes Herminio Commons, MD  Difluprednate (DUREZOL) 0.05 % EMUL Apply 1 drop to eye 2 (two) times daily.   Yes Historical Provider, MD  ferrous  sulfate 325 (65 FE) MG tablet Take 65 mg by mouth daily with breakfast.   Yes Historical Provider, MD  fish oil-omega-3 fatty acids 1000 MG capsule Take 1 capsule by mouth daily.     Yes Historical Provider, MD  JANTOVEN 2.5 MG tablet TAKE 1 TABLET DAILY OR AS  DIRECTED BY COUMADIN CLINIC 02/09/15  Yes Herminio Commons, MD  losartan-hydrochlorothiazide (HYZAAR) 100-25 MG per tablet Take 1 tablet by mouth daily.     Yes Historical Provider, MD  Multiple Vitamins-Minerals (CENTRUM SILVER PO) Take 1 tablet by mouth daily.     Yes Historical Provider, MD  nebivolol (BYSTOLIC) 5 MG tablet Take 1 tablet (5 mg total) by mouth daily. 05/28/15  Yes Herminio Commons, MD  Probiotic Product ([place] PO) Take 1 capsule by mouth daily.    Yes Historical Provider, MD   Allergies Allergies  Allergen Reactions  . Lipitor [Atorvastatin] Other (See Comments)    SEVERE HEADACHE  . Simvastatin Other (See Comments)    MUSCLE ACHES  . Penicillins Rash  . Sulfa Antibiotics Rash    Family History Family History  Problem Relation Age of Onset  . Heart disease Mother    Social History  reports that he has never smoked. He has never used smokeless tobacco. He reports that he does not drink alcohol or use illicit drugs.  Review Of Systems:  Unable to obtain as patient is sedated on vent  Studies: 1/16 L. Foot xray > no acute bony findings or destructive changes 1/18 MRI L. Foot > diffuse cellulitis of left foot, abscess at third metatarsal, no findings for osteonecrosis 1/18 MRA LLE > patent popliteal with poor blood flow below knee, occluded anterior tibial artery 1/18 CXR > cardiomegaly and bilateral opacities suggestive of pulmonary edema  Cultures: 1/16 Blood x2 >> 1/18 abscess > 1/18 anaerobic > 1/18 abscess gram stain >  Antibiotics: 1/16 Aztreonam >> 1/17 1/16 Vancomycin >> 1/18 Clinda >> 1/18 Imipenem-Cilastatin >>  Events Since Admission: 1/18 Taken to OR for L 3rd toe  amputation and I&D, developed acute respiratory distress requiring intubation. 1/19 L AKA  Current Status: Stable, weaning well.  Vital Signs: Temp:  [96.8 F (36 C)-98.8 F (37.1 C)] 97.3 F (36.3 C) (01/20 0752) Pulse Rate:  [41-100] 75 (01/20 0900) Resp:  [12-20] 13 (01/20 0900) BP: (110-178)/(67-125) 178/85 mmHg (01/20 0900) SpO2:  [90 %-100 %] 99 % (01/20 0900) Arterial Line BP: (138-170)/(61-81) 170/76 mmHg (01/20 0900) FiO2 (%):  [40 %-50 %] 50 % (01/20 0807) Weight:  [177 lb 11.1 oz (80.6 kg)] 177 lb 11.1 oz (80.6 kg) (01/20 0600)  Vent Settings: Vent Mode:  [-] PSV;CPAP FiO2 (%):  [40 %-50 %] 50 % Set Rate:  [14 bmp] 14  bmp Vt Set:  [550 mL] 550 mL PEEP:  [5 cmH20-8 cmH20] 5 cmH20 Pressure Support:  [10 cmH20] 10 cmH20 Plateau Pressure:  [15 cmH20-22 cmH20] 18 cmH20   Physical Examination: General:  Awake on vent. No apparent distress Neuro:  Moves all 4 extremities. No gross focal deficits. HEENT: Moist mucus membranes. No JVDD Cardiovascular:  Irregularly irregular, No MRG Lungs:  Clear anter.No wheeze or crackles. Abdomen:  Soft, non-distended, +BS Musculoskeletal:  Left lleg AKA, Surgical dressings, clean.  Principal Problem:   Lower extremity cellulitis Active Problems:   Diabetes mellitus (HCC)   Dyslipidemia   Essential hypertension   Permanent atrial fibrillation (HCC)   Hx of CABG   Chronic anticoagulation   PVD (peripheral vascular disease) with claudication (HCC)   Necrotic toes (HCC)   Pressure ulcer   Acute pulmonary edema (HCC)   Acute congestive heart failure (HCC)   Acute respiratory failure with hypoxia ([place])   Brief patient description:  80 y/o male with PMH of CAD s/p CABG, chronic Afib on warfarin, T2DM with neuropathy, HTN, PVD. On 1/18, he was taken to the OR for amputation of the third left toe with I&D and required urgent intubation due to hypoxemia and pulmonary edema.  ASSESSMENT AND PLAN  PULMONARY A: Acute respiratory  failure with hypoxemia requiring mechanical ventilation 2/2 Acute pulmonary edema P:   Continue full vent support, wean PEEP/FiO2 as tolerated Doing well on SBT. Hopeful for extubation today.  CARDIOVASCULAR A: CAD s/p CABG Chronic Afib on warfarin HTN PVD Likely acute decompensated CHF, no recent Echo P:  Continue lasix diuresis Check CVP. Goal 8-14. On Metoprolol 5 mg q6h  RENAL A: AKI Hypokalemia Hypomagnesemia Hypocalcemia P:   Monitor BMET and UOP Replace electrolytes as needed K supplement Check Mg, Phos  GASTROINTESTINAL A: GI prophylaxis P:   Famotidine NPO  HEMATOLOGIC A: Chronic anticoagulation with warfarin for Afib Leukocytosis DVT ppx with warfarin/heparin on hold P:  Hold warfarin.   INFECTIOUS A: Left foot gangrene/necrosis s/p amputation of 3rd left toe and I&D LLE cellulitis extending to knee P:   Continue abx as above ID consult.   ENDOCRINE A: T2DM with peripheral neuropathy P:   Continue SSI  NEUROLOGIC A: Vent sedation P:   Fentanyl gtt. RASS goal -1  Critical care time- 35 mins. Family updated.  Marshell Garfinkel MD [facility] Pager [phone] If no answer or after 3pm call: [phone] 11/12/2015, 9:48 AM

## 2015-11-12 NOTE — Progress Notes (Signed)
Initial Nutrition Assessment  DOCUMENTATION CODES:   Not applicable  INTERVENTION:   Boost Breeze po TID, each supplement provides 250 kcal and 9 grams of protein  Prostat liquid protein po 30 ml BID with meals, each supplement provides 100 kcal, 15 grams protein  NUTRITION DIAGNOSIS:   Increased nutrient needs related to wound healing as evidenced by estimated needs  GOAL:   Patient will meet greater than or equal to 90% of their needs  MONITOR:   PO intake, Supplement acceptance, Labs, Weight trends, Skin, I & O's  REASON FOR ASSESSMENT:   Other (Comment) (Wound)  ASSESSMENT:   80 yo Male with left lower extremity cellulitis with underlying arterial insufficiency; questionable necrotizing fasciitis.  Patient s/p procedures 1/18: LEFT THIRD TOE AMPUTATION INCISION & DRAINAGE OF PLANTA AND DORSAL SURFACE ABSCESSES IN LEFT FOOT  Patient s/p L AKA 1/19 due to rapid infection spread.  Extubated this AM. Afebrile on Primaxin and Zyvox. Advanced to Clear Liquid diet today.  Would benefit from oral nutrition supplements. RD to order.  RD unable to complete Nutrition Focused Physical Exam at this time.  Diet Order:  Diet clear liquid Room service appropriate?: Yes; Fluid consistency:: Thin  Skin:  Wound (see comment) (Stage II to L foot)  Last BM:  1/17  Height:   Ht Readings from Last 1 Encounters:  11/10/15 5' 8"  (1.727 m)    Weight:   Wt Readings from Last 1 Encounters:  11/12/15 177 lb 11.1 oz (80.6 kg)    Ideal Body Weight:  70 kg  BMI:  Body mass index is 27.02 kg/(m^2).  Estimated Nutritional Needs:   Kcal:  1800-2000  Protein:  110-120 gm  Fluid:  1.8-2.0 L  EDUCATION NEEDS:   No education needs identified at this time  Arthur Holms, RD, LDN Pager #: [phone] After-Hours Pager #: [phone]

## 2015-11-12 NOTE — Progress Notes (Signed)
CRITICAL VALUE ALERT  Critical value received:  Trop    Date of notification:  1/19  Time of notification:  2345  Critical value read back:Yes.    Nurse who received alert:  Jaynie Bream  MD notified (1st page):  elink MD  Time of first page:  0000  MD notified (2nd page):  Time of second page:  Responding MD:  Dr deterding   Time MD responded:  [phone]

## 2015-11-12 NOTE — Progress Notes (Signed)
Patient ID: [name], male   DOB: Feb 20, 1936, 80 y.o.   MRN: [id number] Medicine attending: He underwent  uncomplicated left above the knee amputation yesterday. He was extubated this morning. He is awake, alert, and oriented. Wound dressings are dry. He is afebrile on Primaxin and Zyvox. Abundant gram-positive cocci in pairs on cultures from the foot abscess from January 18. Fibrinogen was greater than 800. D-dimer 2.0 which is expected in view of acute inflammation and surgery. PT and PTT mildly elevated yesterday despite vitamin K and FFP. No gross evidence for DIC at this point. Blood sugars in excellent range. Plan per vascular surgery. Transfer back to medical service to recuperate when otherwise stable.

## 2015-11-12 NOTE — Progress Notes (Signed)
[facility] Progress Note Patient Name: [name] DOB: 08/11/1936 MRN: [id number]   Date of Service  11/12/2015  HPI/Events of Note  Trop increase to 4.12 from 0.35.  No CP.  Had ECHO earlier on 1/19.  Now s/p surgery.  eICU Interventions  Plan: Hold on further intervention. Continue to cycle trop     Intervention Category Minor Interventions: Clinical assessment - ordering diagnostic tests  DETERDING,ELIZABETH 11/12/2015, 12:17 AM

## 2015-11-12 NOTE — Progress Notes (Signed)
[facility] Progress Note Patient Name: MOROCCO GIPE DOB: Apr 21, 1936 MRN: [id number]   Date of Service  11/12/2015  HPI/Events of Note  Frequent PVC's.  eICU Interventions  Will check Mg++ and K+ now.      Intervention Category Major Interventions: Arrhythmia - evaluation and management  Marvis Bakken [place] 11/12/2015, 4:21 PM

## 2015-11-13 ENCOUNTER — Other Ambulatory Visit: Payer: Self-pay

## 2015-11-13 ENCOUNTER — Inpatient Hospital Stay (HOSPITAL_COMMUNITY): Payer: Medicare Other

## 2015-11-13 DIAGNOSIS — L97519 Non-pressure chronic ulcer of other part of right foot with unspecified severity: Secondary | ICD-10-CM

## 2015-11-13 DIAGNOSIS — E11621 Type 2 diabetes mellitus with foot ulcer: Secondary | ICD-10-CM

## 2015-11-13 DIAGNOSIS — E1152 Type 2 diabetes mellitus with diabetic peripheral angiopathy with gangrene: Principal | ICD-10-CM

## 2015-11-13 DIAGNOSIS — E1140 Type 2 diabetes mellitus with diabetic neuropathy, unspecified: Secondary | ICD-10-CM

## 2015-11-13 DIAGNOSIS — B9562 Methicillin resistant Staphylococcus aureus infection as the cause of diseases classified elsewhere: Secondary | ICD-10-CM

## 2015-11-13 LAB — GLUCOSE, CAPILLARY
GLUCOSE-CAPILLARY: 134 mg/dL — AB (ref 65–99)
Glucose-Capillary: 105 mg/dL — ABNORMAL HIGH (ref 65–99)
Glucose-Capillary: 116 mg/dL — ABNORMAL HIGH (ref 65–99)

## 2015-11-13 LAB — CBC
HCT: 30.7 % — ABNORMAL LOW (ref 39.0–52.0)
Hemoglobin: 10.4 g/dL — ABNORMAL LOW (ref 13.0–17.0)
MCH: 31.7 pg (ref 26.0–34.0)
MCHC: 33.9 g/dL (ref 30.0–36.0)
MCV: 93.6 fL (ref 78.0–100.0)
Platelets: 334 10*3/uL (ref 150–400)
RBC: 3.28 MIL/uL — ABNORMAL LOW (ref 4.22–5.81)
RDW: 14.4 % (ref 11.5–15.5)
WBC: 11.7 10*3/uL — ABNORMAL HIGH (ref 4.0–10.5)

## 2015-11-13 LAB — BASIC METABOLIC PANEL
Anion gap: 10 (ref 5–15)
Anion gap: 10 (ref 5–15)
BUN: 31 mg/dL — ABNORMAL HIGH (ref 6–20)
BUN: 29 mg/dL — AB (ref 6–20)
CALCIUM: 7.6 mg/dL — AB (ref 8.9–10.3)
CALCIUM: 7.7 mg/dL — AB (ref 8.9–10.3)
CO2: 28 mmol/L (ref 22–32)
CO2: 29 mmol/L (ref 22–32)
CREATININE: 1.13 mg/dL (ref 0.61–1.24)
Chloride: 104 mmol/L (ref 101–111)
Chloride: 103 mmol/L (ref 101–111)
Creatinine, Ser: 1.1 mg/dL (ref 0.61–1.24)
GFR calc Af Amer: >60 mL/min (ref >60)
GFR calc non Af Amer: >60 mL/min (ref >60)
GLUCOSE: 130 mg/dL — AB (ref 65–99)
Glucose, Bld: 118 mg/dL — ABNORMAL HIGH (ref 65–99)
POTASSIUM: 3.6 mmol/L (ref 3.5–5.1)
Potassium: 3.5 mmol/L (ref 3.5–5.1)
SODIUM: 142 mmol/L (ref 135–145)
Sodium: 142 mmol/L (ref 135–145)

## 2015-11-13 LAB — CULTURE, BLOOD (ROUTINE X 2): Culture: NO GROWTH

## 2015-11-13 LAB — MAGNESIUM
MAGNESIUM: 2 mg/dL (ref 1.7–2.4)
Magnesium: 1.7 mg/dL (ref 1.7–2.4)

## 2015-11-13 LAB — POCT I-STAT 3, ART BLOOD GAS (G3+)
ACID-BASE EXCESS: 1 mmol/L (ref 0.0–2.0)
BICARBONATE: 25.1 meq/L — AB (ref 20.0–24.0)
O2 SAT: 91 %
PCO2 ART: 34.9 mmHg — AB (ref 35.0–45.0)
PH ART: 7.464 — AB (ref 7.350–7.450)
PO2 ART: 57 mmHg — AB (ref 80.0–100.0)
Patient temperature: 98.3
TCO2: 26 mmol/L (ref 0–100)

## 2015-11-13 LAB — PHOSPHORUS
PHOSPHORUS: 3.2 mg/dL (ref 2.5–4.6)
Phosphorus: 3.4 mg/dL (ref 2.5–4.6)

## 2015-11-13 MED ORDER — CETYLPYRIDINIUM CHLORIDE 0.05 % MT LIQD
7.0000 mL | Freq: Two times a day (BID) | OROMUCOSAL | Status: DC
  Administered 2015-11-13 – 2015-11-16 (×7): 7 mL via OROMUCOSAL

## 2015-11-13 MED ORDER — AMLODIPINE BESYLATE 10 MG PO TABS
10.0000 mg | ORAL_TABLET | Freq: Every day | ORAL | Status: DC
  Administered 2015-11-13 – 2015-11-16 (×4): 10 mg via ORAL
  Filled 2015-11-13 (×4): qty 1

## 2015-11-13 MED ORDER — MAGNESIUM SULFATE 2 GM/50ML IV SOLN
2.0000 g | Freq: Once | INTRAVENOUS | Status: AC
  Administered 2015-11-13: 2 g via INTRAVENOUS
  Filled 2015-11-13: qty 50

## 2015-11-13 MED ORDER — POTASSIUM CHLORIDE 10 MEQ/50ML IV SOLN
10.0000 meq | INTRAVENOUS | Status: AC
  Administered 2015-11-13 (×4): 10 meq via INTRAVENOUS
  Filled 2015-11-13 (×3): qty 50

## 2015-11-13 MED ORDER — HALOPERIDOL LACTATE 5 MG/ML IJ SOLN: 1.0000 mg | INTRAMUSCULAR | Status: DC | PRN

## 2015-11-13 MED ORDER — FUROSEMIDE 10 MG/ML IJ SOLN
60.0000 mg | Freq: Two times a day (BID) | INTRAMUSCULAR | Status: DC
  Administered 2015-11-13 (×2): 60 mg via INTRAVENOUS
  Filled 2015-11-13 (×2): qty 6

## 2015-11-13 NOTE — Progress Notes (Addendum)
Name: [name] MRN: [id number] DOB: 01-17-1936    LOS: 5  Referring Provider:  Dr. 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 He has apparently done well during hospital stay, saturating well on room air, normotensive, with rate controlled Afib. In anticipation of angiography and surgery, his warfarin was reversed with Vitamin K. On 1/18, he was taken to the OR for amputation of the third left toe with I&D. In the OR, patient became tachypneic with RR in 30s, desatting to mid-70s, and tachycardic to rate of 120s. He required urgent intubation for hypoxemia and pulmonary edema. Afterwards he was transferred to the surgical ICU. He does not have documented history of CHF and most recent echo on file from 2006 shows EF of 45-50%. CXR shows cardiomegaly and bilateral opacities suggestive of pulmonary edema. PCCM was consulted for vent management.   Past Medical History  Diagnosis Date  . Arteriosclerotic cardiovascular disease (ASCVD)     CABG in 1990s; 2002 total obstruction of LAD, CX and RCA with patent grafts and nl EF; Stress nuc. 2008 - mild LV dilation; normal EF; questionable small anteroapical scar; no ischemia  . Atrial fibrillation ([place])   . Chronic anticoagulation   . Arthritis     "left knee" (11/08/2015)  . Pedal edema     "not a lot", per pt.  . Diabetic peripheral neuropathy (HCC)     bilateral lower leg, hands  . Hypertension     states under control with meds., has been on med. x "long time"  . Peripheral vascular disease ([place])   . 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 Dental crowns present   . Runny nose  07/27/2015    clear drainage, per pt.  . Myocardial infarction ([place]) 1990s    "after my jaw OR"  . Type II diabetes mellitus ([place])     "diet controlled" (11/08/2015)  . Iron deficiency anemia   . History of blood transfusion     "related to my leg surgery?"  . Cellulitis 11/08/2015    "both feet"  . Jaw cancer ([place]) 1990s    "squamous cell"   Past Surgical History  Procedure Laterality Date  . Squamous cell carcinoma excision Left 1993    "took my jaw out; got cadavar in there now"  . Tonsillectomy    . Orif tibia fracture Left ~ 1970  . Colonoscopy N/A 11/27/2014    Procedure: COLONOSCOPY;  Surgeon: Rogene Houston, MD;  [facility];  Service: Endoscopy;  Laterality: N/A;  830  . Incision and drainage abscess Left 06/16/2005    wide exc. abscess 5th toe  . Mandibular hardware removal Left 08/02/2015    Procedure:  HARDWARE REMOVAL TWO MANDIBULAR SCREWS;  Surgeon: Jannette Fogo, DDS;  Location: [facility];  Service: Oral Surgery;  Laterality: Left;  . Fracture surgery    . Cataract extraction w/ intraocular lens  implant, bilateral Bilateral 09/2015  . Cardiac catheterization  1990s; 04/16/2001  . Coronary artery bypass graft  1990's    "CABG X4"  . I&d extremity Left 11/10/2015    Procedure: INCISION AND  DRAINAGE LEFT FOOT, AMPUTATION OF LEFT THIRD TOE;  Surgeon: Conrad [place], MD;  Location: [facility];  Service: Vascular;  Laterality: Left;  . Amputation Left 11/11/2015    Procedure: LEFT ABOVE KNEE AMPUTATION;  Surgeon: Angelia Mould, MD;  Location: [facility];  Service: Vascular;  Laterality: Left;   Prior to Admission medications   Medication Sig Start Date End Date Taking? Authorizing Provider  amLODipine (NORVASC) 10 MG tablet Take 1 tablet (10 mg total) by mouth daily. 03/01/15  Yes Herminio Commons, MD  aspirin 81 MG tablet Take 81 mg by mouth daily.   Yes Historical Provider, MD  cloNIDine (CATAPRES - DOSED IN MG/24 HR) 0.3 mg/24hr patch APPLY 1 PATCH  ONCE WEEKLY 03/24/15  Yes Herminio Commons, MD  Difluprednate (DUREZOL) 0.05 % EMUL Apply 1 drop to eye 2 (two) times daily.   Yes Historical Provider, MD  ferrous sulfate 325 (65 FE) MG tablet Take 65 mg by mouth daily with breakfast.   Yes Historical Provider, MD  fish oil-omega-3 fatty acids 1000 MG capsule Take 1 capsule by mouth daily.     Yes Historical Provider, MD  JANTOVEN 2.5 MG tablet TAKE 1 TABLET DAILY OR AS  DIRECTED BY COUMADIN CLINIC 02/09/15  Yes Herminio Commons, MD  losartan-hydrochlorothiazide (HYZAAR) 100-25 MG per tablet Take 1 tablet by mouth daily.     Yes Historical Provider, MD  Multiple Vitamins-Minerals (CENTRUM SILVER PO) Take 1 tablet by mouth daily.     Yes Historical Provider, MD  nebivolol (BYSTOLIC) 5 MG tablet Take 1 tablet (5 mg total) by mouth daily. 05/28/15  Yes Herminio Commons, MD  Probiotic Product ([place] PO) Take 1 capsule by mouth daily.    Yes Historical Provider, MD   Allergies Allergies  Allergen Reactions  . Lipitor [Atorvastatin] Other (See Comments)    SEVERE HEADACHE  . Simvastatin Other (See Comments)    MUSCLE ACHES  . Penicillins Rash  . Sulfa Antibiotics Rash    Family History Family History  Problem Relation Age of Onset  . Heart disease Mother    Social History  reports that he has never smoked. He has never used smokeless tobacco. He reports that he does not drink alcohol or use illicit drugs.   Studies: 1/16 L. Foot xray > no acute bony findings or destructive changes 1/18 MRI L. Foot > diffuse cellulitis of left foot, abscess at third metatarsal, no findings for osteonecrosis 1/18 MRA LLE > patent popliteal with poor blood flow below knee, occluded anterior tibial artery 1/18 CXR > cardiomegaly and bilateral opacities suggestive of pulmonary edema  Cultures: 1/16 Blood x2 >> 1/18 abscess > 1/18 anaerobic > 1/18 abscess gram stain >  Antibiotics: 1/16 Aztreonam >> 1/17 1/16 Vancomycin >> 1/18  Clinda >> 1/18 Imipenem-Cilastatin >>  Events Since Admission: 1/18 Taken to OR for L 3rd toe amputation and I&D, developed acute respiratory distress requiring intubation. 1/19 L AKA  Current Status: Extubated yesterday. More confused overnight.   Vital Signs: Temp:  [97.7 F (36.5 C)-98.6 F (37 C)] 97.7 F (36.5 C) (01/21 0356) Pulse Rate:  [25-108] 87 (01/21 0700) Resp:  [11-29] 13 (01/21 0700) BP: (130-178)/(69-129) 169/74 mmHg (01/21 0700) SpO2:  [82 %-99 %] 94 % (01/21 0700) Arterial Line BP: (148-170)/(66-76) 161/66 mmHg (01/20 1200) FiO2 (%):  [50 %] 50 % (01/20 1600) Weight:  [174 lb 9.7 oz (79.2 kg)] 174 lb 9.7 oz (79.2 kg) (01/21 0600)  Vent Settings: Vent  Mode:  [-]  FiO2 (%):  [50 %] 50 % Pressure Support:  [5 cmH20] 5 cmH20   Physical Examination: General:  Awake on vent. No apparent distress Neuro:  Moves all 4 extremities. No gross focal deficits. HEENT: Moist mucus membranes. No JVDD Cardiovascular:  Irregularly irregular, No MRG Lungs:  Clear anter.No wheeze or crackles. Abdomen:  Soft, non-distended, +BS Musculoskeletal:  Left lleg AKA, Surgical dressings, clean.  Principal Problem:   Lower extremity cellulitis Active Problems:   Diabetes mellitus (HCC)   Dyslipidemia   Essential hypertension   Permanent atrial fibrillation (HCC)   Hx of CABG   Chronic anticoagulation   PVD (peripheral vascular disease) with claudication (HCC)   Necrotic toes (HCC)   Pressure ulcer   Acute pulmonary edema (HCC)   Acute congestive heart failure (HCC)   Acute respiratory failure with hypoxia ([place])   Brief patient description:  80 y/o male with PMH of CAD s/p CABG, chronic Afib on warfarin, T2DM with neuropathy, HTN, PVD. On 1/18, he was taken to the OR for amputation of the third left toe with I&D and required urgent intubation due to hypoxemia and pulmonary edema.  Extubated yesterday but still confused and hypoxic. Will observe in ICU for 1 more  day.  ASSESSMENT AND PLAN  PULMONARY A: Acute respiratory failure with hypoxemia requiring mechanical ventilation 2/2 Acute pulmonary edema P:   Check CXR and ABG Wean down FiO2 as tolerated.   CARDIOVASCULAR A: CAD s/p CABG Chronic Afib on warfarin HTN PVD Likely acute decompensated CHF, no recent Echo. Frequent PVCs P:  Continue lasix diuresis. Increase to 60 IV q12. On Metoprolol 5 mg q6h. Add home norvasc for HTN  RENAL A: AKI Hypokalemia Hypomagnesemia Hypocalcemia P:   Monitor BMET and UOP Replace electrolytes as needed K supplement Check Mg, Phos, BMP later today.  GASTROINTESTINAL A: GI prophylaxis P:   D/c Famotidine PO diet  HEMATOLOGIC A: Chronic anticoagulation with warfarin for Afib Leukocytosis DVT ppx with warfarin/heparin on hold P:  Hold warfarin.  INFECTIOUS A: Left foot gangrene/necrosis s/p amputation of 3rd left toe and I&D LLE cellulitis extending to knee. GPCs in LE abscess culture. P:   Continue abx as above ID consult.  ENDOCRINE A: T2DM with peripheral neuropathy P:   Continue SSI  NEUROLOGIC A: Confused, delirium P:   Reorient Haldol PRN. Follow qTC.  Critical care time- 35 mins. Family updated 1/20.  Marshell Garfinkel MD [facility] Pager [phone] If no answer or after 3pm call: [phone] 11/13/2015, 8:40 AM

## 2015-11-13 NOTE — Progress Notes (Signed)
INFECTIOUS DISEASE PROGRESS NOTE  ID: [name] is a 80 y.o. male with  Principal Problem:   Lower extremity cellulitis Active Problems:   Diabetes mellitus ([place])   Dyslipidemia   Essential hypertension   Permanent atrial fibrillation (HCC)   Hx of CABG   Chronic anticoagulation   PVD (peripheral vascular disease) with claudication (HCC)   Necrotic toes (HCC)   Pressure ulcer   Acute pulmonary edema (HCC)   Acute congestive heart failure (HCC)   Acute respiratory failure with hypoxia (HCC)  Subjective: Awake and alert.  Describes some soreness, but no pain.   Abtx:  Anti-infectives    Start     Dose/Rate Route Frequency Ordered Stop   11/11/15 1030  linezolid (ZYVOX) IVPB 600 mg     600 mg 300 mL/hr over 60 Minutes Intravenous Every 12 hours 11/11/15 0947     11/10/15 1400  clindamycin (CLEOCIN) IVPB 900 mg  Status:  Discontinued     900 mg 100 mL/hr over 30 Minutes Intravenous 3 times per day 11/10/15 1100 11/11/15 0947   11/10/15 1230  imipenem-cilastatin (PRIMAXIN) 500 mg in sodium chloride 0.9 % 100 mL IVPB     500 mg 200 mL/hr over 30 Minutes Intravenous 3 times per day 11/10/15 1122     11/09/15 1230  vancomycin (VANCOCIN) 1,500 mg in sodium chloride 0.9 % 500 mL IVPB  Status:  Discontinued     1,500 mg 250 mL/hr over 120 Minutes Intravenous Every 24 hours 11/08/15 1202 11/11/15 0947   11/08/15 1400  aztreonam (AZACTAM) 1 g in dextrose 5 % 50 mL IVPB  Status:  Discontinued     1 g 100 mL/hr over 30 Minutes Intravenous 3 times per day 11/08/15 1202 11/10/15 1100   11/08/15 1215  vancomycin (VANCOCIN) 1,750 mg in sodium chloride 0.9 % 500 mL IVPB     1,750 mg 250 mL/hr over 120 Minutes Intravenous  Once 11/08/15 1202 11/08/15 1449      Medications:  Scheduled: . amLODipine  10 mg Oral Daily  . antiseptic oral rinse  7 mL Mouth Rinse QID  . chlorhexidine gluconate  15 mL Mouth Rinse BID  . feeding supplement  1 Container Oral TID BM  . feeding  supplement (PRO-STAT SUGAR FREE 64)  30 mL Oral BID  . fentaNYL (SUBLIMAZE) injection  50 mcg Intravenous Once  . furosemide  60 mg Intravenous Q12H  . imipenem-cilastatin  500 mg Intravenous 3 times per day  . insulin aspart  0-5 Units Subcutaneous QHS  . insulin aspart  0-9 Units Subcutaneous TID WC  . linezolid (ZYVOX) IV  600 mg Intravenous Q12H  . metoprolol  5 mg Intravenous 4 times per day  . potassium chloride  40 mEq Oral Daily  . sodium chloride  3 mL Intravenous Q12H    Objective: Vital signs in last 24 hours: Temp:  [97.7 F (36.5 C)-98.6 F (37 C)] 98.3 F (36.8 C) (01/21 0700) Pulse Rate:  [37-110] 105 (01/21 1004) Resp:  [11-29] 22 (01/21 1004) BP: (130-169)/(69-129) 158/86 mmHg (01/21 1004) SpO2:  [82 %-99 %] 87 % (01/21 1004) Arterial Line BP: (161)/(66) 161/66 mmHg (01/20 1200) FiO2 (%):  [50 %] 50 % (01/21 0800) Weight:  [79.2 kg (174 lb 9.7 oz)] 79.2 kg (174 lb 9.7 oz) (01/21 0600)   General appearance: alert, cooperative and no distress Resp: clear to auscultation bilaterally Cardio: regular rate and rhythm GI: normal findings: bowel sounds normal and soft, non-tender Extremities: LLE  stump dressed, clean. no proximal erythema.   Lab Results  Recent Labs  11/12/15 0322 11/12/15 1643 11/13/15 0412  WBC 11.3*  --  11.7*  HGB 9.9*  --  10.4*  HCT 29.4*  --  30.7*  NA 146* 143 142  K 3.3* 3.4* 3.5  CL 108 105 104  CO2 26 28 28   BUN 30* 31* 31*  CREATININE 1.18 1.24 1.13   Liver Panel  Recent Labs  11/11/15 0930  PROT 6.9  ALBUMIN 2.6*  AST 37  ALT 39  ALKPHOS 131*  BILITOT 2.1*   Sedimentation Rate No results for input(s): ESRSEDRATE in the last 72 hours. C-Reactive Protein No results for input(s): CRP in the last 72 hours.  Microbiology: Recent Results (from the past 240 hour(s))  Culture, blood (routine x 2)     Status: None (Preliminary result)   Collection Time: 11/08/15 10:32 AM  Result Value Ref Range Status   Specimen  Description BLOOD RIGHT ANTECUBITAL  Final   Special Requests BOTTLES DRAWN AEROBIC AND ANAEROBIC 5CC  Final   Culture NO GROWTH 3 DAYS  Final   Report Status PENDING  Incomplete  Culture, blood (routine x 2)     Status: None (Preliminary result)   Collection Time: 11/08/15 11:42 AM  Result Value Ref Range Status   Specimen Description BLOOD RIGHT ANTECUBITAL  Final   Special Requests BOTTLES DRAWN AEROBIC AND ANAEROBIC 5CCS  Final   Culture NO GROWTH 4 DAYS  Final   Report Status PENDING  Incomplete  Anaerobic culture     Status: None (Preliminary result)   Collection Time: 11/10/15  9:44 PM  Result Value Ref Range Status   Specimen Description ABSCESS LEFT FOOT  Final   Special Requests THIRD METATARSAL  Final   Gram Stain   Final    MODERATE WBC PRESENT, PREDOMINANTLY PMN NO SQUAMOUS EPITHELIAL CELLS SEEN ABUNDANT GRAM POSITIVE COCCI IN PAIRS Performed at [facility]    Culture   Final    NO ANAEROBES ISOLATED; CULTURE IN PROGRESS FOR 5 DAYS Performed at [facility]    Report Status PENDING  Incomplete  Culture, routine-abscess     Status: None (Preliminary result)   Collection Time: 11/10/15  9:44 PM  Result Value Ref Range Status   Specimen Description ABSCESS LEFT FOOT  Final   Special Requests THIRD METATARSAL  Final   Gram Stain   Final    ABUNDANT WBC PRESENT,BOTH PMN AND MONONUCLEAR NO SQUAMOUS EPITHELIAL CELLS SEEN MODERATE GRAM POSITIVE COCCI IN PAIRS Performed at [facility]    Culture   Final    MODERATE STAPHYLOCOCCUS AUREUS Note: RIFAMPIN AND GENTAMICIN SHOULD NOT BE USED AS SINGLE DRUGS FOR TREATMENT OF STAPH INFECTIONS. Performed at [facility]    Report Status PENDING  Incomplete  MRSA PCR Screening     Status: None   Collection Time: 11/11/15 10:10 AM  Result Value Ref Range Status   MRSA by PCR NEGATIVE NEGATIVE Final    Comment:        The GeneXpert MRSA Assay (FDA approved for NASAL specimens only), is one  component of a comprehensive MRSA colonization surveillance program. It is not intended to diagnose MRSA infection nor to guide or monitor treatment for MRSA infections.     Studies/Results: Dg Chest Port 1 View  11/12/2015  CLINICAL DATA:  Acute respiratory failure EXAM: PORTABLE CHEST 1 VIEW COMPARISON:  Yesterday FINDINGS: Endotracheal tube tip in good position at the  clavicular heads. Right subclavian central line with tip at the lower SVC. New enteric tube at least reaches the stomach. Multiple EKG leads overlap the upper chest and create artifact. Increased hazy opacity at the basilar chest. No air bronchograms. No pneumothorax. Stable cardiomegaly and aortic contour after CABG. IMPRESSION: 1. Increased or layering right pleural effusion and atelectasis. 2. Stable positioning of tubes and central line. Electronically Signed   By: Monte Fantasia M.D.   On: 11/12/2015 08:36     Assessment/Plan: LLE gangrene  AKA 1-20 DM2 with neuropathy Diabetic foot ulcers  Total days of antibiotics: zyvox/imipenem Appears to be tolerating anbx Cx growing Staph aureus Will stop imipenem Need to watch the ulcer on his other foot.          Bobby Rumpf Infectious Diseases (pager) [phone] [email or web address] 11/13/2015, 11:18 AM  LOS: 5 days

## 2015-11-13 NOTE — Progress Notes (Signed)
  [facility] Progress Note  Subjective  - POD #2  Doing ok.   Objective Filed Vitals:   11/13/15 0600 11/13/15 0700  BP: 161/123 169/74  Pulse: 82 87  Temp:    Resp: 18 13  Tmax 98.6   Intake/Output Summary (Last 24 hours) at 11/13/15 0840 Last data filed at 11/13/15 0700  Gross per 24 hour  Intake   3670 ml  Output   4520 ml  Net   -850 ml   Resting in bed in NAD Left AKA incision clean, dry and intact. Stump is well perfused. No erythema seen.   Assessment/Planning: 80 y.o. male is s/p: left AKA 2 Days Post-Op   Left AKA stump dressing taken down. Stump appears viable. No cellulitis seen.  Leukocytosis improved. Afebrile. Abx per ID recommendations.    Alvia Grove, PA-C 11/13/2015 8:40 AM --  Laboratory CBC    Component Value Date/Time   WBC 11.7* 11/13/2015 0412   HGB 10.4* 11/13/2015 0412   HCT 30.7* 11/13/2015 0412   PLT 334 11/13/2015 0412    BMET    Component Value Date/Time   NA 142 11/13/2015 0412   K 3.5 11/13/2015 0412   CL 104 11/13/2015 0412   CO2 28 11/13/2015 0412   GLUCOSE 118* 11/13/2015 0412   BUN 31* 11/13/2015 0412   CREATININE 1.13 11/13/2015 0412   CREATININE 0.84 06/09/2013 1228   CALCIUM 7.6* 11/13/2015 0412   GFRNONAA >60 11/13/2015 0412   GFRAA >60 11/13/2015 0412    COAG Lab Results  Component Value Date   INR 1.98* 11/11/2015   INR 1.89* 11/11/2015   INR 1.90* 11/11/2015   No results found for: PTT  Antibiotics Anti-infectives    Start     Dose/Rate Route Frequency Ordered Stop   11/11/15 1030  linezolid (ZYVOX) IVPB 600 mg     600 mg 300 mL/hr over 60 Minutes Intravenous Every 12 hours 11/11/15 0947     11/10/15 1400  clindamycin (CLEOCIN) IVPB 900 mg  Status:  Discontinued     900 mg 100 mL/hr over 30 Minutes Intravenous 3 times per day 11/10/15 1100 11/11/15 0947   11/10/15 1230  imipenem-cilastatin (PRIMAXIN) 500 mg in sodium chloride 0.9 % 100 mL IVPB     500 mg 200 mL/hr over  30 Minutes Intravenous 3 times per day 11/10/15 1122     11/09/15 1230  vancomycin (VANCOCIN) 1,500 mg in sodium chloride 0.9 % 500 mL IVPB  Status:  Discontinued     1,500 mg 250 mL/hr over 120 Minutes Intravenous Every 24 hours 11/08/15 1202 11/11/15 0947   11/08/15 1400  aztreonam (AZACTAM) 1 g in dextrose 5 % 50 mL IVPB  Status:  Discontinued     1 g 100 mL/hr over 30 Minutes Intravenous 3 times per day 11/08/15 1202 11/10/15 1100   11/08/15 1215  vancomycin (VANCOCIN) 1,750 mg in sodium chloride 0.9 % 500 mL IVPB     1,750 mg 250 mL/hr over 120 Minutes Intravenous  Once 11/08/15 1202 11/08/15 1449       Virgina Jock, PA-C [facility] Office: [phone] Pager: [phone] 11/13/2015 8:40 AM

## 2015-11-13 NOTE — Progress Notes (Signed)
[facility] Progress Note Patient Name: [name] DOB: [birth year] MRN: [id number]   Date of Service  11/13/2015  HPI/Events of Note  K+ = 3.6, Mg++ = 1.7 and Creatinine = 1.1.  eICU Interventions  Will replete Mg++ and K+.     Intervention Category Intermediate Interventions: Electrolyte abnormality - evaluation and management  [name] 11/13/2015, 6:00 PM

## 2015-11-13 NOTE — Progress Notes (Signed)
    Subjective  - POD #2  Pain Is well controlled   Physical Exam:  Dressing was changed today. His stump is healing nicely without breakdown or erythema.  Minimal drainage.       Assessment/Plan:  POD #2  Continue routine postoperative care for above-knee amputation Continue antibiotics  [name], [name] 11/13/2015 9:09 AM --  Filed Vitals:   11/13/15 0600 11/13/15 0700  BP: 161/123 169/74  Pulse: 82 87  Temp:  98.3 F (36.8 C)  Resp: 18 13    Intake/Output Summary (Last 24 hours) at 11/13/15 0909 Last data filed at 11/13/15 0700  Gross per 24 hour  Intake 3587.5 ml  Output   4520 ml  Net -932.5 ml     Laboratory CBC    Component Value Date/Time   WBC 11.7* 11/13/2015 0412   HGB 10.4* 11/13/2015 0412   HCT 30.7* 11/13/2015 0412   PLT 334 11/13/2015 0412    BMET    Component Value Date/Time   NA 142 11/13/2015 0412   K 3.5 11/13/2015 0412   CL 104 11/13/2015 0412   CO2 28 11/13/2015 0412   GLUCOSE 118* 11/13/2015 0412   BUN 31* 11/13/2015 0412   CREATININE 1.13 11/13/2015 0412   CREATININE 0.84 06/09/2013 1228   CALCIUM 7.6* 11/13/2015 0412   GFRNONAA >60 11/13/2015 0412   GFRAA >60 11/13/2015 0412    COAG Lab Results  Component Value Date   INR 1.98* 11/11/2015   INR 1.89* 11/11/2015   INR 1.90* 11/11/2015   No results found for: PTT  Antibiotics Anti-infectives    Start     Dose/Rate Route Frequency Ordered Stop   11/11/15 1030  linezolid (ZYVOX) IVPB 600 mg     600 mg 300 mL/hr over 60 Minutes Intravenous Every 12 hours 11/11/15 0947     11/10/15 1400  clindamycin (CLEOCIN) IVPB 900 mg  Status:  Discontinued     900 mg 100 mL/hr over 30 Minutes Intravenous 3 times per day 11/10/15 1100 11/11/15 0947   11/10/15 1230  imipenem-cilastatin (PRIMAXIN) 500 mg in sodium chloride 0.9 % 100 mL IVPB     500 mg 200 mL/hr over 30 Minutes Intravenous 3 times per day 11/10/15 1122     11/09/15 1230  vancomycin (VANCOCIN) 1,500 mg in sodium  chloride 0.9 % 500 mL IVPB  Status:  Discontinued     1,500 mg 250 mL/hr over 120 Minutes Intravenous Every 24 hours 11/08/15 1202 11/11/15 0947   11/08/15 1400  aztreonam (AZACTAM) 1 g in dextrose 5 % 50 mL IVPB  Status:  Discontinued     1 g 100 mL/hr over 30 Minutes Intravenous 3 times per day 11/08/15 1202 11/10/15 1100   11/08/15 1215  vancomycin (VANCOCIN) 1,750 mg in sodium chloride 0.9 % 500 mL IVPB     1,750 mg 250 mL/hr over 120 Minutes Intravenous  Once 11/08/15 1202 11/08/15 1449       V. Leia Alf, M.D. [facility] Office: [phone] Pager:  [phone]

## 2015-11-13 NOTE — Progress Notes (Signed)
130mg fentanyl wasted with SLily KocherRN. WSherrie Mustache12:33 PM

## 2015-11-14 ENCOUNTER — Inpatient Hospital Stay (HOSPITAL_COMMUNITY): Payer: Medicare Other

## 2015-11-14 DIAGNOSIS — R195 Other fecal abnormalities: Secondary | ICD-10-CM

## 2015-11-14 DIAGNOSIS — E8770 Fluid overload, unspecified: Secondary | ICD-10-CM

## 2015-11-14 LAB — CULTURE, ROUTINE-ABSCESS

## 2015-11-14 LAB — BASIC METABOLIC PANEL
ANION GAP: 13 (ref 5–15)
BUN: 30 mg/dL — AB (ref 6–20)
CALCIUM: 8.1 mg/dL — AB (ref 8.9–10.3)
CO2: 30 mmol/L (ref 22–32)
Chloride: 100 mmol/L — ABNORMAL LOW (ref 101–111)
Creatinine, Ser: 1.14 mg/dL (ref 0.61–1.24)
GFR calc Af Amer: >60 mL/min (ref >60)
GLUCOSE: 111 mg/dL — AB (ref 65–99)
Potassium: 3.9 mmol/L (ref 3.5–5.1)
Sodium: 143 mmol/L (ref 135–145)

## 2015-11-14 LAB — GLUCOSE, CAPILLARY
GLUCOSE-CAPILLARY: 118 mg/dL — AB (ref 65–99)
GLUCOSE-CAPILLARY: 145 mg/dL — AB (ref 65–99)
GLUCOSE-CAPILLARY: 120 mg/dL — AB (ref 65–99)
GLUCOSE-CAPILLARY: 137 mg/dL — AB (ref 65–99)
Glucose-Capillary: 125 mg/dL — ABNORMAL HIGH (ref 65–99)

## 2015-11-14 LAB — CULTURE, BLOOD (ROUTINE X 2): Culture: NO GROWTH

## 2015-11-14 LAB — CBC
HCT: 33.8 % — ABNORMAL LOW (ref 39.0–52.0)
Hemoglobin: 11.4 g/dL — ABNORMAL LOW (ref 13.0–17.0)
MCH: 31.8 pg (ref 26.0–34.0)
MCHC: 33.7 g/dL (ref 30.0–36.0)
MCV: 94.4 fL (ref 78.0–100.0)
PLATELETS: 388 10*3/uL (ref 150–400)
RBC: 3.58 MIL/uL — ABNORMAL LOW (ref 4.22–5.81)
RDW: 14.4 % (ref 11.5–15.5)
WBC: 12.3 10*3/uL — AB (ref 4.0–10.5)

## 2015-11-14 LAB — MAGNESIUM: Magnesium: 1.9 mg/dL (ref 1.7–2.4)

## 2015-11-14 LAB — PHOSPHORUS: Phosphorus: 3.8 mg/dL (ref 2.5–4.6)

## 2015-11-14 LAB — C DIFFICILE QUICK SCREEN W PCR REFLEX
C Diff antigen: NEGATIVE
C Diff interpretation: NEGATIVE
C Diff toxin: NEGATIVE

## 2015-11-14 MED ORDER — SODIUM CHLORIDE 0.9 % IJ SOLN: 10.0000 mL | INTRAMUSCULAR | Status: DC | PRN

## 2015-11-14 MED ORDER — SILVER SULFADIAZINE 1 % EX CREA
TOPICAL_CREAM | Freq: Every day | CUTANEOUS | Status: DC
  Administered 2015-11-14: 17:00:00 via TOPICAL
  Administered 2015-11-15: 1 via TOPICAL
  Administered 2015-11-16: 13:00:00 via TOPICAL
  Filled 2015-11-14: qty 85

## 2015-11-14 MED ORDER — CEFAZOLIN SODIUM-DEXTROSE 2-3 GM-% IV SOLR
2.0000 g | Freq: Three times a day (TID) | INTRAVENOUS | Status: DC
  Administered 2015-11-14 – 2015-11-15 (×3): 2 g via INTRAVENOUS
  Filled 2015-11-14 (×5): qty 50

## 2015-11-14 MED ORDER — FUROSEMIDE 10 MG/ML IJ SOLN
40.0000 mg | Freq: Every day | INTRAMUSCULAR | Status: DC
  Administered 2015-11-14 – 2015-11-15 (×2): 40 mg via INTRAVENOUS
  Filled 2015-11-14 (×2): qty 4

## 2015-11-14 NOTE — Progress Notes (Signed)
IM teaching service will resume care on 1/23 at 7AM.  Albin Felling, MD, MPH Internal Medicine Resident, PGY-II Pager: [phone]

## 2015-11-14 NOTE — Progress Notes (Addendum)
Name: [name] MRN: [id number] DOB: October 14, 1936    LOS: 6  Referring Provider:  Dr. 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 He has apparently done well during hospital stay, saturating well on room air, normotensive, with rate controlled Afib. In anticipation of angiography and surgery, his warfarin was reversed with Vitamin K. On 1/18, he was taken to the OR for amputation of the third left toe with I&D. In the OR, patient became tachypneic with RR in 30s, desatting to mid-70s, and tachycardic to rate of 120s. He required urgent intubation for hypoxemia and pulmonary edema. Afterwards he was transferred to the surgical ICU. He does not have documented history of CHF and most recent echo on file from 2006 shows EF of 45-50%. CXR shows cardiomegaly and bilateral opacities suggestive of pulmonary edema. PCCM was consulted for vent management.   Past Medical History  Diagnosis Date  . Arteriosclerotic cardiovascular disease (ASCVD)     CABG in 1990s; 2002 total obstruction of LAD, CX and RCA with patent grafts and nl EF; Stress nuc. 2008 - mild LV dilation; normal EF; questionable small anteroapical scar; no ischemia  . Atrial fibrillation ([place])   . Chronic anticoagulation   . Arthritis     "left knee" (11/08/2015)  . Pedal edema     "not a lot", per pt.  . Diabetic peripheral neuropathy (HCC)     bilateral lower leg, hands  . Hypertension     states under control with meds., has been on med. x "long time"  . Peripheral vascular disease ([place])   . 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 Dental crowns present   . Runny nose  07/27/2015    clear drainage, per pt.  . Myocardial infarction ([place]) 1990s    "after my jaw OR"  . Type II diabetes mellitus ([place])     "diet controlled" (11/08/2015)  . Iron deficiency anemia   . History of blood transfusion     "related to my leg surgery?"  . Cellulitis 11/08/2015    "both feet"  . Jaw cancer ([place]) 1990s    "squamous cell"   Past Surgical History  Procedure Laterality Date  . Squamous cell carcinoma excision Left 1993    "took my jaw out; got cadavar in there now"  . Tonsillectomy    . Orif tibia fracture Left ~ 1970  . Colonoscopy N/A 11/27/2014    Procedure: COLONOSCOPY;  Surgeon: Rogene Houston, MD;  [facility];  Service: Endoscopy;  Laterality: N/A;  830  . Incision and drainage abscess Left 06/16/2005    wide exc. abscess 5th toe  . Mandibular hardware removal Left 08/02/2015    Procedure:  HARDWARE REMOVAL TWO MANDIBULAR SCREWS;  Surgeon: Jannette Fogo, DDS;  Location: [facility];  Service: Oral Surgery;  Laterality: Left;  . Fracture surgery    . Cataract extraction w/ intraocular lens  implant, bilateral Bilateral 09/2015  . Cardiac catheterization  1990s; 04/16/2001  . Coronary artery bypass graft  1990's    "CABG X4"  . I&d extremity Left 11/10/2015    Procedure: INCISION AND  DRAINAGE LEFT FOOT, AMPUTATION OF LEFT THIRD TOE;  Surgeon: Conrad Wenonah, MD;  Location: [facility];  Service: Vascular;  Laterality: Left;  . Amputation Left 11/11/2015    Procedure: LEFT ABOVE KNEE AMPUTATION;  Surgeon: Angelia Mould, MD;  Location: [facility];  Service: Vascular;  Laterality: Left;   Prior to Admission medications   Medication Sig Start Date End Date Taking? Authorizing Provider  amLODipine (NORVASC) 10 MG tablet Take 1 tablet (10 mg total) by mouth daily. 03/01/15  Yes Herminio Commons, MD  aspirin 81 MG tablet Take 81 mg by mouth daily.   Yes Historical Provider, MD  cloNIDine (CATAPRES - DOSED IN MG/24 HR) 0.3 mg/24hr patch APPLY 1 PATCH  ONCE WEEKLY 03/24/15  Yes Herminio Commons, MD  Difluprednate (DUREZOL) 0.05 % EMUL Apply 1 drop to eye 2 (two) times daily.   Yes Historical Provider, MD  ferrous sulfate 325 (65 FE) MG tablet Take 65 mg by mouth daily with breakfast.   Yes Historical Provider, MD  fish oil-omega-3 fatty acids 1000 MG capsule Take 1 capsule by mouth daily.     Yes Historical Provider, MD  JANTOVEN 2.5 MG tablet TAKE 1 TABLET DAILY OR AS  DIRECTED BY COUMADIN CLINIC 02/09/15  Yes Herminio Commons, MD  losartan-hydrochlorothiazide (HYZAAR) 100-25 MG per tablet Take 1 tablet by mouth daily.     Yes Historical Provider, MD  Multiple Vitamins-Minerals (CENTRUM SILVER PO) Take 1 tablet by mouth daily.     Yes Historical Provider, MD  nebivolol (BYSTOLIC) 5 MG tablet Take 1 tablet (5 mg total) by mouth daily. 05/28/15  Yes Herminio Commons, MD  Probiotic Product ([place] PO) Take 1 capsule by mouth daily.    Yes Historical Provider, MD   Allergies Allergies  Allergen Reactions  . Lipitor [Atorvastatin] Other (See Comments)    SEVERE HEADACHE  . Simvastatin Other (See Comments)    MUSCLE ACHES  . Penicillins Rash  . Sulfa Antibiotics Rash    Family History Family History  Problem Relation Age of Onset  . Heart disease Mother    Social History  reports that he has never smoked. He has never used smokeless tobacco. He reports that he does not drink alcohol or use illicit drugs.   Studies: 1/16 L. Foot xray > no acute bony findings or destructive changes 1/18 MRI L. Foot > diffuse cellulitis of left foot, abscess at third metatarsal, no findings for osteonecrosis 1/18 MRA LLE > patent popliteal with poor blood flow below knee, occluded anterior tibial artery 1/18 CXR > cardiomegaly and bilateral opacities suggestive of pulmonary edema  Cultures: 1/16 Blood x2 >> 1/18 abscess > 1/18 anaerobic > 1/18 abscess gram stain >  Antibiotics: 1/16 Aztreonam >> 1/17 1/16 Vancomycin >>  1/19 1/18 Clinda >> 1/9 1/18 Imipenem-Cilastatin >> 1/21 1/19 Linezolid >>   Events Since Admission: 1/18 Taken to OR for L 3rd toe amputation and I&D, developed acute respiratory distress requiring intubation. 1/19 L AKA  Current Status: Doing well in room air. Has mild confusion at nights.  Vital Signs: Temp:  [97.6 F (36.4 C)-98.6 F (37 C)] 97.8 F (36.6 C) (01/22 0420) Pulse Rate:  [37-110] 37 (01/22 0700) Resp:  [12-26] 20 (01/22 0700) BP: (122-161)/(70-105) 160/90 mmHg (01/22 0700) SpO2:  [87 %-100 %] 98 % (01/22 0700) FiO2 (%):  [50 %] 50 % (01/21 0800)  Vent Settings: Vent Mode:  [-]  FiO2 (%):  [50 %] 50 %  Physical Examination: General:  Awake, No apparent distress Neuro:  Moves all 4 extremities. No gross focal deficits. HEENT: Moist mucus membranes. No JVDD Cardiovascular:  Irregularly irregular, No MRG Lungs:  Clear anter.No wheeze or crackles. Abdomen:  Soft, non-distended, +BS Musculoskeletal:  Left leg AKA, Surgical dressings, clean.  Principal Problem:   Lower extremity cellulitis Active Problems:   Diabetes mellitus (HCC)   Dyslipidemia   Essential hypertension   Permanent atrial fibrillation (HCC)   Hx of CABG   Chronic anticoagulation   PVD (peripheral vascular disease) with claudication (HCC)   Necrotic toes (HCC)   Pressure ulcer   Acute pulmonary edema (HCC)   Acute congestive heart failure (HCC)   Acute respiratory failure with hypoxia ([place])   Brief patient description:  80 y/o male with PMH of CAD s/p CABG, chronic Afib on warfarin, T2DM with neuropathy, HTN, PVD. On 1/18, he was taken to the OR for amputation of the third left toe with I&D and required urgent intubation due to hypoxemia and pulmonary edema.   ASSESSMENT AND PLAN  PULMONARY A: Acute respiratory failure with hypoxemia requiring mechanical ventilation 2/2 Acute pulmonary edema P:   Doing well on room air.  CARDIOVASCULAR A: CAD s/p CABG Chronic Afib on  warfarin HTN PVD Likely acute decompensated CHF, no recent Echo. Frequent PVCs P:  Reduce lasix dose to 40 mg IV qd as he is approaching euvolumic state On Metoprolol 5 mg q6h. Added home norvasc for HTN  RENAL A: AKI Hypokalemia Hypomagnesemia Hypocalcemia P:   Monitor BMET and UOP Replace electrolytes as needed K supplement  GASTROINTESTINAL A: GI prophylaxis P:   PO diet  HEMATOLOGIC A: Chronic anticoagulation with warfarin for Afib Leukocytosis DVT ppx with warfarin/heparin on hold P:  Recheck coags. Will restart coumadin if cleared by surgery  INFECTIOUS A: Left foot gangrene/necrosis s/p amputation of 3rd left toe and I&D LLE cellulitis extending to knee. GPCs in LE abscess culture. P:   Continue abx as above ID consult. Check C diff given the diarrhea.  ENDOCRINE A: T2DM with peripheral neuropathy P:   Continue SSI  NEUROLOGIC A: Confused, delirium P:   Reorient Haldol PRN. Follow qTC.  Family updated 1/20.  Marshell Garfinkel MD [facility] Pager [phone] If no answer or after 3pm call: [phone] 11/14/2015, 7:38 AM

## 2015-11-14 NOTE — Progress Notes (Addendum)
INFECTIOUS DISEASE PROGRESS NOTE  ID: LAWRNCE [name] is a 80 y.o. male with  Principal Problem:   Lower extremity cellulitis Active Problems:   Diabetes mellitus ([place])   Dyslipidemia   Essential hypertension   Permanent atrial fibrillation (HCC)   Hx of CABG   Chronic anticoagulation   PVD (peripheral vascular disease) with claudication (HCC)   Necrotic toes (HCC)   Pressure ulcer   Acute pulmonary edema (HCC)   Acute congestive heart failure (HCC)   Acute respiratory failure with hypoxia (HCC)  Subjective: Feels well.   Abtx:  Anti-infectives    Start     Dose/Rate Route Frequency Ordered Stop   11/11/15 1030  linezolid (ZYVOX) IVPB 600 mg     600 mg 300 mL/hr over 60 Minutes Intravenous Every 12 hours 11/11/15 0947     11/10/15 1400  clindamycin (CLEOCIN) IVPB 900 mg  Status:  Discontinued     900 mg 100 mL/hr over 30 Minutes Intravenous 3 times per day 11/10/15 1100 11/11/15 0947   11/10/15 1230  imipenem-cilastatin (PRIMAXIN) 500 mg in sodium chloride 0.9 % 100 mL IVPB  Status:  Discontinued     500 mg 200 mL/hr over 30 Minutes Intravenous 3 times per day 11/10/15 1122 11/13/15 1127   11/09/15 1230  vancomycin (VANCOCIN) 1,500 mg in sodium chloride 0.9 % 500 mL IVPB  Status:  Discontinued     1,500 mg 250 mL/hr over 120 Minutes Intravenous Every 24 hours 11/08/15 1202 11/11/15 0947   11/08/15 1400  aztreonam (AZACTAM) 1 g in dextrose 5 % 50 mL IVPB  Status:  Discontinued     1 g 100 mL/hr over 30 Minutes Intravenous 3 times per day 11/08/15 1202 11/10/15 1100   11/08/15 1215  vancomycin (VANCOCIN) 1,750 mg in sodium chloride 0.9 % 500 mL IVPB     1,750 mg 250 mL/hr over 120 Minutes Intravenous  Once 11/08/15 1202 11/08/15 1449      Medications:  Scheduled: . amLODipine  10 mg Oral Daily  . antiseptic oral rinse  7 mL Mouth Rinse BID  . antiseptic oral rinse  7 mL Mouth Rinse QID  . chlorhexidine gluconate  15 mL Mouth Rinse BID  . feeding supplement  1  Container Oral TID BM  . feeding supplement (PRO-STAT SUGAR FREE 64)  30 mL Oral BID  . fentaNYL (SUBLIMAZE) injection  50 mcg Intravenous Once  . furosemide  40 mg Intravenous Daily  . insulin aspart  0-5 Units Subcutaneous QHS  . insulin aspart  0-9 Units Subcutaneous TID WC  . linezolid (ZYVOX) IV  600 mg Intravenous Q12H  . metoprolol  5 mg Intravenous 4 times per day  . potassium chloride  40 mEq Oral Daily  . sodium chloride  3 mL Intravenous Q12H    Objective: Vital signs in last 24 hours: Temp:  [97.6 F (36.4 C)-98.6 F (37 C)] 97.8 F (36.6 C) (01/22 0420) Pulse Rate:  [37-105] 37 (01/22 0700) Resp:  [12-26] 20 (01/22 0700) BP: (122-161)/(70-105) 160/90 mmHg (01/22 0700) SpO2:  [87 %-100 %] 98 % (01/22 0700)   General appearance: alert, cooperative and no distress Resp: clear to auscultation bilaterally Cardio: regular rate and rhythm GI: normal findings: bowel sounds normal and soft, non-tender Extremities: LLE wound is well dressed, clean.   Lab Results  Recent Labs  11/13/15 0412 11/13/15 1604 11/14/15 0436  WBC 11.7*  --  12.3*  HGB 10.4*  --  11.4*  HCT 30.7*  --  33.8*  NA 142 142 143  K 3.5 3.6 3.9  CL 104 103 100*  CO2 28 29 30   BUN 31* 29* 30*  CREATININE 1.13 1.10 1.14   Liver Panel No results for input(s): PROT, ALBUMIN, AST, ALT, ALKPHOS, BILITOT, BILIDIR, IBILI in the last 72 hours. Sedimentation Rate No results for input(s): ESRSEDRATE in the last 72 hours. C-Reactive Protein No results for input(s): CRP in the last 72 hours.  Microbiology: Recent Results (from the past 240 hour(s))  Culture, blood (routine x 2)     Status: None (Preliminary result)   Collection Time: 11/08/15 10:32 AM  Result Value Ref Range Status   Specimen Description BLOOD RIGHT ANTECUBITAL  Final   Special Requests BOTTLES DRAWN AEROBIC AND ANAEROBIC 5CC  Final   Culture NO GROWTH 4 DAYS  Final   Report Status PENDING  Incomplete  Culture, blood (routine x  2)     Status: None   Collection Time: 11/08/15 11:42 AM  Result Value Ref Range Status   Specimen Description BLOOD RIGHT ANTECUBITAL  Final   Special Requests BOTTLES DRAWN AEROBIC AND ANAEROBIC 5CCS  Final   Culture NO GROWTH 5 DAYS  Final   Report Status 11/13/2015 FINAL  Final  Anaerobic culture     Status: None (Preliminary result)   Collection Time: 11/10/15  9:44 PM  Result Value Ref Range Status   Specimen Description ABSCESS LEFT FOOT  Final   Special Requests THIRD METATARSAL  Final   Gram Stain   Final    MODERATE WBC PRESENT, PREDOMINANTLY PMN NO SQUAMOUS EPITHELIAL CELLS SEEN ABUNDANT GRAM POSITIVE COCCI IN PAIRS Performed at [facility]    Culture   Final    NO ANAEROBES ISOLATED; CULTURE IN PROGRESS FOR 5 DAYS Performed at [facility]    Report Status PENDING  Incomplete  Culture, routine-abscess     Status: None   Collection Time: 11/10/15  9:44 PM  Result Value Ref Range Status   Specimen Description ABSCESS LEFT FOOT  Final   Special Requests THIRD METATARSAL  Final   Gram Stain   Final    ABUNDANT WBC PRESENT,BOTH PMN AND MONONUCLEAR NO SQUAMOUS EPITHELIAL CELLS SEEN MODERATE GRAM POSITIVE COCCI IN PAIRS Performed at [facility]    Culture   Final    MODERATE STAPHYLOCOCCUS AUREUS Note: RIFAMPIN AND GENTAMICIN SHOULD NOT BE USED AS SINGLE DRUGS FOR TREATMENT OF STAPH INFECTIONS. Performed at [facility]    Report Status 11/14/2015 FINAL  Final   Organism ID, Bacteria STAPHYLOCOCCUS AUREUS  Final      Susceptibility   Staphylococcus aureus - MIC*    CLINDAMYCIN <=0.25 SENSITIVE Sensitive     ERYTHROMYCIN 0.5 SENSITIVE Sensitive     GENTAMICIN <=0.5 SENSITIVE Sensitive     LEVOFLOXACIN 0.25 SENSITIVE Sensitive     OXACILLIN 0.5 SENSITIVE Sensitive     RIFAMPIN <=0.5 SENSITIVE Sensitive     TRIMETH/SULFA <=10 SENSITIVE Sensitive     VANCOMYCIN 1 SENSITIVE Sensitive     TETRACYCLINE <=1 SENSITIVE Sensitive      MOXIFLOXACIN <=0.25 SENSITIVE Sensitive     * MODERATE STAPHYLOCOCCUS AUREUS  MRSA PCR Screening     Status: None   Collection Time: 11/11/15 10:10 AM  Result Value Ref Range Status   MRSA by PCR NEGATIVE NEGATIVE Final    Comment:        The GeneXpert MRSA Assay (FDA approved for NASAL specimens only), is one component of a comprehensive  MRSA colonization surveillance program. It is not intended to diagnose MRSA infection nor to guide or monitor treatment for MRSA infections.     Studies/Results: Dg Chest Port 1 View  11/14/2015  CLINICAL DATA:  Acute respiratory failure EXAM: PORTABLE CHEST 1 VIEW COMPARISON:  11/13/2015 FINDINGS: Cardiomegaly again noted. Right central line with tip in SVC again noted. Status post CABG. Slight improvement in aeration. Residual mild interstitial prominence in right upper lobe perihilar and right infrahilar region probable residual mild interstitial edema. No segmental infiltrates. IMPRESSION: Right central line with tip in SVC again noted. Status post CABG. Slight improvement in aeration. Residual mild interstitial prominence in right upper lobe perihilar and right infrahilar region probable residual mild interstitial edema. Electronically Signed   By: Lahoma Crocker M.D.   On: 11/14/2015 09:19   Dg Chest Port 1 View  11/13/2015  CLINICAL DATA:  Below the knee amputation on January 19th. Acute respiratory distress requiring intubation on January 18th. Now extubated. EXAM: PORTABLE CHEST 1 VIEW COMPARISON:  Chest x-rays dated 11/12/2015 and 11/11/2015. FINDINGS: Increased opacities throughout the right lung and at the left lung base, most likely edema. Cardiomegaly is stable. Patient is status post median sternotomy for presumed CABG. Right-sided line is stable in position with tip overlying the mid SVC. Endotracheal tube and enteric tube have been removed in the interval. IMPRESSION: 1. Cardiomegaly is stable. 2. Diffuse airspace opacities throughout the right  lung and at the left lung base, most suggestive of pulmonary edema related to volume overload/CHF. Electronically Signed   By: Franki Cabot M.D.   On: 11/13/2015 11:35     Assessment/Plan: LLE gangrene  AKA 1-20 DM2 with neuropathy Diabetic foot ulcers In isolation for 2 loose stools (mucous) in last 24h.  Fluid overload on CXR  Total days of antibiotics: 7 zyvox Appears to be tolerating anbx- would aim for 14 days of anbx (will change to ancef- hx of PEN rash " a long time ago"). Explained risk of cross reaction to cephalosporin and that we will monitor. Cx growing Staph aureus- MSSA Need to watch the ulcer on his other foot.  Await stool studies          Bobby Rumpf Infectious Diseases (pager) [phone] [email or web address] 11/14/2015, 9:43 AM  LOS: 6 days

## 2015-11-14 NOTE — Progress Notes (Signed)
Report called to [facility] on [facility]. Taeko Schaffer S 5:03 PM

## 2015-11-15 ENCOUNTER — Inpatient Hospital Stay (HOSPITAL_COMMUNITY): Payer: Medicare Other

## 2015-11-15 DIAGNOSIS — D62 Acute posthemorrhagic anemia: Secondary | ICD-10-CM

## 2015-11-15 DIAGNOSIS — R269 Unspecified abnormalities of gait and mobility: Secondary | ICD-10-CM

## 2015-11-15 DIAGNOSIS — I25810 Atherosclerosis of coronary artery bypass graft(s) without angina pectoris: Secondary | ICD-10-CM

## 2015-11-15 DIAGNOSIS — L8990 Pressure ulcer of unspecified site, unspecified stage: Secondary | ICD-10-CM

## 2015-11-15 DIAGNOSIS — Z89612 Acquired absence of left leg above knee: Secondary | ICD-10-CM

## 2015-11-15 DIAGNOSIS — R000 Tachycardia, unspecified: Secondary | ICD-10-CM

## 2015-11-15 DIAGNOSIS — I482 Chronic atrial fibrillation: Secondary | ICD-10-CM

## 2015-11-15 DIAGNOSIS — I10 Essential (primary) hypertension: Secondary | ICD-10-CM

## 2015-11-15 DIAGNOSIS — D72829 Elevated white blood cell count, unspecified: Secondary | ICD-10-CM

## 2015-11-15 DIAGNOSIS — Z951 Presence of aortocoronary bypass graft: Secondary | ICD-10-CM

## 2015-11-15 LAB — BASIC METABOLIC PANEL
ANION GAP: 11 (ref 5–15)
BUN: 35 mg/dL — ABNORMAL HIGH (ref 6–20)
CALCIUM: 7.7 mg/dL — AB (ref 8.9–10.3)
CHLORIDE: 104 mmol/L (ref 101–111)
CO2: 27 mmol/L (ref 22–32)
Creatinine, Ser: 1.06 mg/dL (ref 0.61–1.24)
GFR calc non Af Amer: >60 mL/min (ref >60)
GLUCOSE: 123 mg/dL — AB (ref 65–99)
POTASSIUM: 3.8 mmol/L (ref 3.5–5.1)
Sodium: 142 mmol/L (ref 135–145)

## 2015-11-15 LAB — CBC
HEMATOCRIT: 34.9 % — AB (ref 39.0–52.0)
HEMOGLOBIN: 11.8 g/dL — AB (ref 13.0–17.0)
MCH: 31.9 pg (ref 26.0–34.0)
MCHC: 33.8 g/dL (ref 30.0–36.0)
MCV: 94.3 fL (ref 78.0–100.0)
Platelets: 412 10*3/uL — ABNORMAL HIGH (ref 150–400)
RBC: 3.7 MIL/uL — ABNORMAL LOW (ref 4.22–5.81)
RDW: 14.2 % (ref 11.5–15.5)
WBC: 12.7 10*3/uL — AB (ref 4.0–10.5)

## 2015-11-15 LAB — PHOSPHORUS: PHOSPHORUS: 3.7 mg/dL (ref 2.5–4.6)

## 2015-11-15 LAB — GLUCOSE, CAPILLARY
GLUCOSE-CAPILLARY: 118 mg/dL — AB (ref 65–99)
GLUCOSE-CAPILLARY: 118 mg/dL — AB (ref 65–99)
GLUCOSE-CAPILLARY: 120 mg/dL — AB (ref 65–99)
Glucose-Capillary: 119 mg/dL — ABNORMAL HIGH (ref 65–99)

## 2015-11-15 LAB — PROTIME-INR
INR: 1.7 — AB (ref 0.00–1.49)
PROTHROMBIN TIME: 20 s — AB (ref 11.6–15.2)

## 2015-11-15 LAB — MAGNESIUM: Magnesium: 1.9 mg/dL (ref 1.7–2.4)

## 2015-11-15 MED ORDER — NEBIVOLOL HCL 5 MG PO TABS
5.0000 mg | ORAL_TABLET | Freq: Every day | ORAL | Status: DC
  Administered 2015-11-15 – 2015-11-16 (×2): 5 mg via ORAL
  Filled 2015-11-15 (×2): qty 1

## 2015-11-15 MED ORDER — ZOLPIDEM TARTRATE 5 MG PO TABS
5.0000 mg | ORAL_TABLET | Freq: Every evening | ORAL | Status: DC | PRN
  Administered 2015-11-15: 5 mg via ORAL
  Filled 2015-11-15: qty 1

## 2015-11-15 MED ORDER — LOSARTAN POTASSIUM-HCTZ 100-25 MG PO TABS: 1.0000 | ORAL_TABLET | Freq: Every day | ORAL | Status: DC

## 2015-11-15 MED ORDER — WARFARIN - PHYSICIAN DOSING INPATIENT: Freq: Every day | Status: DC

## 2015-11-15 MED ORDER — ASPIRIN EC 81 MG PO TBEC
81.0000 mg | DELAYED_RELEASE_TABLET | Freq: Every day | ORAL | Status: DC
  Administered 2015-11-15 – 2015-11-16 (×2): 81 mg via ORAL
  Filled 2015-11-15 (×2): qty 1

## 2015-11-15 MED ORDER — DIPHENHYDRAMINE HCL 25 MG PO CAPS: 25.0000 mg | ORAL_CAPSULE | Freq: Every evening | ORAL | Status: DC | PRN

## 2015-11-15 MED ORDER — LOSARTAN POTASSIUM 50 MG PO TABS
100.0000 mg | ORAL_TABLET | Freq: Every day | ORAL | Status: DC
  Administered 2015-11-15 – 2015-11-16 (×2): 100 mg via ORAL
  Filled 2015-11-15 (×2): qty 2

## 2015-11-15 MED ORDER — OMEGA-3 FATTY ACIDS 1000 MG PO CAPS: 1.0000 | ORAL_CAPSULE | Freq: Every day | ORAL | Status: DC

## 2015-11-15 MED ORDER — FUROSEMIDE 40 MG PO TABS
40.0000 mg | ORAL_TABLET | Freq: Every day | ORAL | Status: DC
  Administered 2015-11-16: 40 mg via ORAL
  Filled 2015-11-15: qty 1

## 2015-11-15 MED ORDER — HYDROCHLOROTHIAZIDE 25 MG PO TABS
25.0000 mg | ORAL_TABLET | Freq: Every day | ORAL | Status: DC
  Administered 2015-11-15 – 2015-11-16 (×2): 25 mg via ORAL
  Filled 2015-11-15 (×2): qty 1

## 2015-11-15 MED ORDER — WARFARIN SODIUM 2.5 MG PO TABS
2.5000 mg | ORAL_TABLET | Freq: Every day | ORAL | Status: DC
  Administered 2015-11-15: 2.5 mg via ORAL
  Filled 2015-11-15: qty 1

## 2015-11-15 MED ORDER — DIFLUPREDNATE 0.05 % OP EMUL: 1.0000 [drp] | Freq: Two times a day (BID) | OPHTHALMIC | Status: DC

## 2015-11-15 MED ORDER — OMEGA-3-ACID ETHYL ESTERS 1 G PO CAPS
1.0000 g | ORAL_CAPSULE | Freq: Every day | ORAL | Status: DC
  Administered 2015-11-15 – 2015-11-16 (×2): 1 g via ORAL
  Filled 2015-11-15 (×2): qty 1

## 2015-11-15 MED ORDER — CEPHALEXIN 500 MG PO CAPS
500.0000 mg | ORAL_CAPSULE | Freq: Four times a day (QID) | ORAL | Status: DC
  Administered 2015-11-15 – 2015-11-16 (×5): 500 mg via ORAL
  Filled 2015-11-15 (×6): qty 1

## 2015-11-15 MED ORDER — FUROSEMIDE 40 MG PO TABS: 40.0000 mg | ORAL_TABLET | Freq: Every day | ORAL | Status: DC

## 2015-11-15 NOTE — Progress Notes (Addendum)
Patient ID: [name], male   DOB: [birth year], 80 y.o.   MRN: [id number]   Subjective:  [name] was transferred back to medicine service from the ICU today. He was originally transferred to the ICU perioperatively because he developed acute pulmonary edema. He was intubated and diuresed and is now saturating well on room air.  [name] seems to be in low spirits today. He doesn't have any particular complaints and is interested in doing inpatient rehabilitation.  Objective: Vital signs in last 24 hours: Filed Vitals:   11/14/15 1743 11/14/15 2134 11/15/15 0430 11/15/15 1010  BP: 163/80 121/51 146/83 149/71  Pulse: 102 88 78   Temp: 97.6 F (36.4 C) 97.5 F (36.4 C) 98 F (36.7 C)   TempSrc: Oral Oral Oral   Resp: 19 19 19    Height:      Weight:      SpO2: 94% 96% 94%    Physical exam: General: resting in bed comfortably, appropriately conversational but sad today Cardiac: iregular rate and rhythm, no rubs, murmurs or gallops Pulm: breathing well, clear to auscultation bilaterally Ext: left above knee amputation. The right plantar foot has large verrucous plaque that doesn't appear to be infected. Skin: Stage II sacral decubitus ulcer without surrounding erythema or drainage  Lab Results: Basic Metabolic Panel:  Recent Labs Lab 11/14/15 0436 11/15/15 0255  NA 143 142  K 3.9 3.8  CL 100* 104  CO2 30 27  GLUCOSE 111* 123*  BUN 30* 35*  CREATININE 1.14 1.06  CALCIUM 8.1* 7.7*  MG 1.9 1.9  PHOS 3.8 3.7   CBC:  Recent Labs Lab 11/14/15 0436 11/15/15 0255  WBC 12.3* 12.7*  HGB 11.4* 11.8*  HCT 33.8* 34.9*  MCV 94.4 94.3  PLT 388 412*   Coagulation:  Recent Labs Lab 11/10/15 1540 11/11/15 0038 11/11/15 0930 11/11/15 1150  LABPROT 24.3* 24.9* 21.7*  21.6* 22.4*  INR 2.20* 2.28* 1.90*  1.89* 1.98*   Micro Results: Recent Results (from the past 240 hour(s))  Culture, blood (routine x 2)     Status: None   Collection Time: 11/08/15 10:32 AM    Result Value Ref Range Status   Specimen Description BLOOD RIGHT ANTECUBITAL  Final   Special Requests BOTTLES DRAWN AEROBIC AND ANAEROBIC 5CC  Final   Culture NO GROWTH 5 DAYS  Final   Report Status 11/14/2015 FINAL  Final  Culture, blood (routine x 2)     Status: None   Collection Time: 11/08/15 11:42 AM  Result Value Ref Range Status   Specimen Description BLOOD RIGHT ANTECUBITAL  Final   Special Requests BOTTLES DRAWN AEROBIC AND ANAEROBIC 5CCS  Final   Culture NO GROWTH 5 DAYS  Final   Report Status 11/13/2015 FINAL  Final  Anaerobic culture     Status: None (Preliminary result)   Collection Time: 11/10/15  9:44 PM  Result Value Ref Range Status   Specimen Description ABSCESS LEFT FOOT  Final   Special Requests THIRD METATARSAL  Final   Gram Stain   Final    MODERATE WBC PRESENT, PREDOMINANTLY PMN NO SQUAMOUS EPITHELIAL CELLS SEEN ABUNDANT GRAM POSITIVE COCCI IN PAIRS Performed at [facility]    Culture   Final    NO ANAEROBES ISOLATED; CULTURE IN PROGRESS FOR 5 DAYS Performed at [facility]    Report Status PENDING  Incomplete  Culture, routine-abscess     Status: None   Collection Time: 11/10/15  9:44 PM  Result Value  Ref Range Status   Specimen Description ABSCESS LEFT FOOT  Final   Special Requests THIRD METATARSAL  Final   Gram Stain   Final    ABUNDANT WBC PRESENT,BOTH PMN AND MONONUCLEAR NO SQUAMOUS EPITHELIAL CELLS SEEN MODERATE GRAM POSITIVE COCCI IN PAIRS Performed at [facility]    Culture   Final    MODERATE STAPHYLOCOCCUS AUREUS Note: RIFAMPIN AND GENTAMICIN SHOULD NOT BE USED AS SINGLE DRUGS FOR TREATMENT OF STAPH INFECTIONS. Performed at [facility]    Report Status 11/14/2015 FINAL  Final   Organism ID, Bacteria STAPHYLOCOCCUS AUREUS  Final      Susceptibility   Staphylococcus aureus - MIC*    CLINDAMYCIN <=0.25 SENSITIVE Sensitive     ERYTHROMYCIN 0.5 SENSITIVE Sensitive     GENTAMICIN <=0.5 SENSITIVE  Sensitive     LEVOFLOXACIN 0.25 SENSITIVE Sensitive     OXACILLIN 0.5 SENSITIVE Sensitive     RIFAMPIN <=0.5 SENSITIVE Sensitive     TRIMETH/SULFA <=10 SENSITIVE Sensitive     VANCOMYCIN 1 SENSITIVE Sensitive     TETRACYCLINE <=1 SENSITIVE Sensitive     MOXIFLOXACIN <=0.25 SENSITIVE Sensitive     * MODERATE STAPHYLOCOCCUS AUREUS  MRSA PCR Screening     Status: None   Collection Time: 11/11/15 10:10 AM  Result Value Ref Range Status   MRSA by PCR NEGATIVE NEGATIVE Final    Comment:        The GeneXpert MRSA Assay (FDA approved for NASAL specimens only), is one component of a comprehensive MRSA colonization surveillance program. It is not intended to diagnose MRSA infection nor to guide or monitor treatment for MRSA infections.   C difficile quick scan w PCR reflex     Status: None   Collection Time: 11/14/15  2:47 PM  Result Value Ref Range Status   C Diff antigen NEGATIVE NEGATIVE Final   C Diff toxin NEGATIVE NEGATIVE Final   C Diff interpretation Negative for toxigenic C. difficile  Final   Studies/Results: Dg Chest Port 1 View  11/15/2015  CLINICAL DATA:  Acute respiratory failure, shortness of Breath EXAM: PORTABLE CHEST 1 VIEW COMPARISON:  11/14/2015 FINDINGS: Prior CABG. Right subclavian central line tip in the SVC, unchanged. Cardiomegaly. Right lower lobe airspace opacity. No confluent opacity on the left. No effusions. No acute bony abnormality. IMPRESSION: Increasing right infrahilar/lower lobe airspace opacity. Cannot exclude pneumonia. Stable cardiomegaly. Electronically Signed   By: Rolm Baptise M.D.   On: 11/15/2015 07:20   Dg Chest Port 1 View  11/14/2015  CLINICAL DATA:  Acute respiratory failure EXAM: PORTABLE CHEST 1 VIEW COMPARISON:  11/13/2015 FINDINGS: Cardiomegaly again noted. Right central line with tip in SVC again noted. Status post CABG. Slight improvement in aeration. Residual mild interstitial prominence in right upper lobe perihilar and right  infrahilar region probable residual mild interstitial edema. No segmental infiltrates. IMPRESSION: Right central line with tip in SVC again noted. Status post CABG. Slight improvement in aeration. Residual mild interstitial prominence in right upper lobe perihilar and right infrahilar region probable residual mild interstitial edema. Electronically Signed   By: Lahoma Crocker M.D.   On: 11/14/2015 09:19   Medications: I have reviewed the patient's current medications. Scheduled Meds: . amLODipine  10 mg Oral Daily  . antiseptic oral rinse  7 mL Mouth Rinse BID  . aspirin EC  81 mg Oral Daily  .  ceFAZolin (ANCEF) IV  2 g Intravenous 3 times per day  . Difluprednate  1 drop Ophthalmic BID  .  feeding supplement  1 Container Oral TID BM  . feeding supplement (PRO-STAT SUGAR FREE 64)  30 mL Oral BID  . furosemide  40 mg Intravenous Daily  . losartan  100 mg Oral Daily   And  . hydrochlorothiazide  25 mg Oral Daily  . insulin aspart  0-5 Units Subcutaneous QHS  . insulin aspart  0-9 Units Subcutaneous TID WC  . nebivolol  5 mg Oral Daily  . omega-3 acid ethyl esters  1 g Oral Daily  . potassium chloride  40 mEq Oral Daily  . silver sulfADIAZINE   Topical Daily  . sodium chloride  3 mL Intravenous Q12H  . warfarin  2.5 mg Oral q1800  . Warfarin - Physician Dosing Inpatient   Does not apply q1800   Continuous Infusions:  PRN Meds:.acetaminophen, haloperidol lactate, morphine injection, ondansetron (ZOFRAN) IV, polyvinyl alcohol, sodium chloride, traMADol   Assessment/Plan:  Necrotizing cellulitis of left lower extremity: He got an above-knee amputation on January 19 status since been afebrile with a downtrending leukocytosis. It appears they got adequate source control. Wound cultures grew methicillin sensitive staph aureus. Infectious disease would like for him to continue on a total of 14 day course of cephalexin. We will put in a consult to inpatient rehabilitation today. -Stopped IV  cefazolin -Started cephalexin 500 mg every 6 hours, stop date February 5 -Consult inpatient rehabilitation -Tramadol 50 mg every 6 hours as needed for pain -Stopped morphine  Chronic atrial fibrillation: We will restart his warfarin today. -Checking INR -Restarted warfarin 2.5 mg daily -Restarted home nebivolol 5 mg daily  Heart failure with preserved ejection fraction: He went into acute pulmonary edema perioperatively after getting about 400 mL fluid with his FFP. His echo showed a normal ejection fraction of 55-60%. He was heavily diuresed in ICU and appears euvolemic now. -Stopped furosemide IV 40 mg daily -Started oral furosemide 40 mg daily -Continue aspirin 81 mg daily  Hypertension: Pressures in the 150s not on any medications. We'll slowly start some back. -Continue amlodipine 10 mg daily -Restarted home losartan 100 mg daily -Restarted home thiazide 25 mg daily  Dispo: Discharge to inpatient versus outpatient rehabilitation pending their input.  The patient does have a current PCP Lemmie Evens, MD) and does need an [facility] hospital follow-up appointment after discharge.  The patient does have transportation limitations that hinder transportation to clinic appointments.  .Services Needed at time of discharge: Y = Yes, Blank = No PT:   OT:   RN:   Equipment:   Other:     LOS: 7 days   Loleta Chance, MD 11/15/2015, 10:59 AM

## 2015-11-15 NOTE — Care Management Note (Addendum)
Case Management Note Previous CM note initiated by Luz Lex RN, CM  Patient Details  Name: [name] MRN: [id number] Date of Birth: 08-Jan-1936  Subjective/Objective:     Post op toe amputation on 1-18 - unable to extubated.  To ICU on vent.                 Action/Plan: Tx out of ICU on 1/22//17- post op day 4- Left AKA ,Continue abx per ID. Possible pneumonia on CXR per MD note-  CIR has been consulted, may need SNF as backup plan. PT/OT evals pending. CM will follow for d/c plans.   Expected Discharge Date:                  Expected Discharge Plan:  IP Rehab Facility  In-House Referral:  Clinical Social Work  Discharge planning Services  CM Consult  Post Acute Care Choice:    Choice offered to:     DME Arranged:    DME Agency:     HH Arranged:    [facility]:     Status of Service:  In process, will continue to follow  Medicare Important Message Given:    Date Medicare IM Given:    Medicare IM give by:    Date Additional Medicare IM Given:    Additional Medicare Important Message give by:     If discussed at [place] of Stay Meetings, dates discussed:    Additional Comments:  Dawayne Patricia, RN 11/15/2015, 11:43 AM

## 2015-11-15 NOTE — Consult Note (Addendum)
   [facility] Inpatient Consult   11/15/2015  LISSANDRO DILORENZO 1936/05/28 [id number]   Patient screened for [place] Management services. Went to bedside to speak with him about [place] Management program. However, patient is working with therapy. Will come back at later time.   Addendum: Came back to bedside after patient's therapy visit. Spoke with patient and wife about [place] Management services. Mrs. Therien states they are waiting on approval for CIR. She states she would prefer to take [place] Management information and contact if needed. Supplied [facility] brochure and contact information to wife to call if interested. Will make inpatient RNCM aware patient and wife pleasantly decline [facility] Care Management services at this time.    Marthenia Rolling, MSN-Ed, RN,BSN [facility] Liaison [phone]

## 2015-11-15 NOTE — Progress Notes (Signed)
Patient ID: [name], male   DOB: 1936/05/04, 80 y.o.   MRN: [id number] Medicine attending: I personally examined this patient this morning together with resident physician Dr. Loleta Chance and I concur with his evaluation and management plan which we discussed together. The patient's condition has now stabilized status post initial emergency left third toe amputation and drainage of foot abscess on January 18 with subsequent left above the knee amputation the next day. He is stable at this point to resume anticoagulation which she was on for chronic atrial fibrillation. We  asked infectious disease consultant for recommendations on duration of total antibiotic therapy and whether he can be transitioned to oral antibiotics at this time. Wound cultures from the foot abscess grew penicillin sensitive staph aureus. Consulted feel that we are safe to transition him to oral cephalexin at this time to continue a total course of 14 days. We will get a rehabilitation consult. Once the stump wound has healed, he will be fitted for a prosthesis.

## 2015-11-15 NOTE — Consult Note (Signed)
Physical Medicine and Rehabilitation Consult   Reason for Consult: L-AKA Referring Physician: Dr. Dareen Piano   HPI: [name] is a 80 y.o. male with history of CAD, CAF - chronic coumadin, DM type 2 with peripheral neuropathy, PAD LLE and right foot wound/callus who was admitted on 11/08/15 with left 2nd and 3rd toe discoloration and left leg erythema due to cellulitis.  Coumadin reversed and he was taken to OR for I and with amputation of left 3 rd toe.  Perioperatively developed tachycardia, hypoxemia with pulmonary edema and was transferred to SICU for vent support. A fib with RVR treated with IV BB and he was diuresed for acute decompensated CHF.  ID consulted for input question of necrotizing changes due to progression of cellulitis up to thigh Patient with sloughing of skin with concern for toxin mediated process and antibiotics linzeolid and imipenem for better coverage.  He was treated with FFP due to elevated INR with question DIC and underwent L-AKA on 01/19.  He tolerated extubation without difficulty and L-AKA stump healing well.  Wound cultures with staph aureus and antibiotics narrowed to Cefazolin X 2 weeks post surgery.  PT/OT evaluations pending and CIR recommended for follow up therapy.   Review of Systems  Constitutional: Negative for fever and chills.  HENT: Negative for hearing loss.   Eyes: Negative for blurred vision and double vision.  Respiratory: Negative for cough and shortness of breath.   Cardiovascular: Negative for chest pain and palpitations.  Gastrointestinal: Positive for constipation (takes medications daily). Negative for heartburn, nausea and abdominal pain.  Genitourinary: Negative for dysuria.  Musculoskeletal: Negative for myalgias, back pain, joint pain, falls and neck pain.  Skin: Negative for rash.  Neurological: Positive for tingling and sensory change (bilateral hands and feet). Negative for dizziness and headaches.    Psychiatric/Behavioral: Negative for depression. The patient is not nervous/anxious and does not have insomnia.   All other systems reviewed and are negative.   Past Medical History  Diagnosis Date  . Arteriosclerotic cardiovascular disease (ASCVD)     CABG in 1990s; 2002 total obstruction of LAD, CX and RCA with patent grafts and nl EF; Stress nuc. 2008 - mild LV dilation; normal EF; questionable small anteroapical scar; no ischemia  . Atrial fibrillation ([place])   . Chronic anticoagulation   . Arthritis     "left knee" (11/08/2015)  . Pedal edema     "not a lot", per pt.  . Diabetic peripheral neuropathy (HCC)     bilateral lower leg, hands  . Hypertension     states under control with meds., has been on med. x "long time"  . Peripheral vascular disease ([place])   . 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 Dental crowns present   . Runny nose 07/27/2015    clear drainage, per pt.  . Myocardial infarction ([place]) 1990s    "after my jaw OR"  . Type II diabetes mellitus ([place])     "diet controlled" (11/08/2015)  . Iron deficiency anemia   . History of blood transfusion     "related to my leg surgery?"  . Cellulitis 11/08/2015    "both feet"  . Jaw cancer ([place]) 1990s    "squamous cell"    Past Surgical History  Procedure Laterality Date  . Squamous cell carcinoma excision Left 1993    "took my jaw out; got cadavar in there now"  . Tonsillectomy    . Orif  tibia fracture Left ~ 1970  . Colonoscopy N/A 11/27/2014    Procedure: COLONOSCOPY;  Surgeon: Rogene Houston, MD;  [facility];  Service: Endoscopy;  Laterality: N/A;  830  . Incision and drainage abscess Left 06/16/2005    wide exc. abscess 5th toe  . Mandibular hardware removal Left 08/02/2015    Procedure:  HARDWARE REMOVAL TWO MANDIBULAR SCREWS;  Surgeon: Jannette Fogo, DDS;  Location: [facility];  Service: Oral Surgery;  Laterality: Left;  . Fracture surgery    . Cataract  extraction w/ intraocular lens  implant, bilateral Bilateral 09/2015  . Cardiac catheterization  1990s; 04/16/2001  . Coronary artery bypass graft  1990's    "CABG X4"  . I&d extremity Left 11/10/2015    Procedure: INCISION AND DRAINAGE LEFT FOOT, AMPUTATION OF LEFT THIRD TOE;  Surgeon: Conrad Ocean, MD;  Location: [facility];  Service: Vascular;  Laterality: Left;  . Amputation Left 11/11/2015    Procedure: LEFT ABOVE KNEE AMPUTATION;  Surgeon: Angelia Mould, MD;  Location: [facility] OR;  Service: Vascular;  Laterality: Left;     Family History  Problem Relation Age of Onset  . Heart disease Mother    Social History:  Married. Wife works part time but plans on providing supervision after discharge. he is a retired Therapist, sports carrier and was independent without AD PTA.  He reports that he has never smoked. He has never used smokeless tobacco. He reports that he does not drink alcohol or use illicit drugs.   Allergies  Allergen Reactions  . Lipitor [Atorvastatin] Other (See Comments)    SEVERE HEADACHE  . Simvastatin Other (See Comments)    MUSCLE ACHES  . Penicillins Rash  . Sulfa Antibiotics Rash    Tolerates silver sulfadiazine cream at home    Medications Prior to Admission  Medication Sig Dispense Refill  . amLODipine (NORVASC) 10 MG tablet Take 1 tablet (10 mg total) by mouth daily. 90 tablet 3  . aspirin 81 MG tablet Take 81 mg by mouth daily.    . cloNIDine (CATAPRES - DOSED IN MG/24 HR) 0.3 mg/24hr patch APPLY 1 PATCH ONCE WEEKLY 12 patch 3  . Difluprednate (DUREZOL) 0.05 % EMUL Apply 1 drop to eye 2 (two) times daily.    . ferrous sulfate 325 (65 FE) MG tablet Take 65 mg by mouth daily with breakfast.    . fish oil-omega-3 fatty acids 1000 MG capsule Take 1 capsule by mouth daily.      Marland Kitchen JANTOVEN 2.5 MG tablet TAKE 1 TABLET DAILY OR AS  DIRECTED BY COUMADIN CLINIC 90 tablet 3  . losartan-hydrochlorothiazide (HYZAAR) 100-25 MG per tablet Take 1 tablet by mouth daily.      .  Multiple Vitamins-Minerals (CENTRUM SILVER PO) Take 1 tablet by mouth daily.      . nebivolol (BYSTOLIC) 5 MG tablet Take 1 tablet (5 mg total) by mouth daily. 90 tablet 3  . Probiotic Product (PHILLIPS COLON HEALTH PO) Take 1 capsule by mouth daily.       Home: Home Living Family/patient expects to be discharged to:: Private residence Living Arrangements: Spouse/significant other  Functional History:   Functional Status:  Mobility:          ADL:    Cognition: Cognition Orientation Level: Oriented to person, Oriented to situation, Oriented to time, Disoriented to place    Blood pressure 149/71, pulse 78, temperature 98 F (36.7 C), temperature source Oral, resp. rate 19, height 5' 8"  (  1.727 m), weight 79.2 kg (174 lb 9.7 oz), SpO2 94 %. Physical Exam  Nursing note and vitals reviewed. Constitutional: He is oriented to person, place, and time. He appears well-developed and well-nourished.  HENT:  Head: Normocephalic and atraumatic.  Mouth/Throat: Oropharynx is clear and moist.  Eyes: Conjunctivae and EOM are normal. Pupils are equal, round, and reactive to light. No scleral icterus.  Neck: Normal range of motion. Neck supple.  Cardiovascular: Intact distal pulses.  An irregularly irregular rhythm present.  Respiratory: Effort normal and breath sounds normal. No respiratory distress. He has no wheezes.  GI: Soft. Bowel sounds are normal. He exhibits no distension. There is no tenderness.  Musculoskeletal: He exhibits edema and tenderness.  L-AKA with retention sock and minimal tenderness to touch.    Neurological: He is alert and oriented to person, place, and time. He has normal reflexes.  Speech clear and follows commands without difficulty.  Motor: 5/5 B/l UE, RLE, Left hip flexion Sensation diminished to light touch right foot  Skin: Skin is warm and dry.  Left AKA dressing c/d/i Right foot plantar surface with debrided callus with clean granulation tissue.     Psychiatric: He has a normal mood and affect. His behavior is normal. Thought content normal.    Results for orders placed or performed during the hospital encounter of 11/08/15 (from the past 24 hour(s))  Glucose, capillary     Status: Abnormal   Collection Time: 11/14/15  1:00 PM  Result Value Ref Range   Glucose-Capillary 145 (H) 65 - 99 mg/dL   Comment 1 Notify RN   C difficile quick scan w PCR reflex     Status: None   Collection Time: 11/14/15  2:47 PM  Result Value Ref Range   C Diff antigen NEGATIVE NEGATIVE   C Diff toxin NEGATIVE NEGATIVE   C Diff interpretation Negative for toxigenic C. difficile   Glucose, capillary     Status: Abnormal   Collection Time: 11/14/15  4:40 PM  Result Value Ref Range   Glucose-Capillary 120 (H) 65 - 99 mg/dL   Comment 1 Notify RN   Glucose, capillary     Status: Abnormal   Collection Time: 11/14/15  9:38 PM  Result Value Ref Range   Glucose-Capillary 137 (H) 65 - 99 mg/dL  CBC     Status: Abnormal   Collection Time: 11/15/15  2:55 AM  Result Value Ref Range   WBC 12.7 (H) 4.0 - 10.5 K/uL   RBC 3.70 (L) 4.22 - 5.81 MIL/uL   Hemoglobin 11.8 (L) 13.0 - 17.0 g/dL   HCT 34.9 (L) 39.0 - 52.0 %   MCV 94.3 78.0 - 100.0 fL   MCH 31.9 26.0 - 34.0 pg   MCHC 33.8 30.0 - 36.0 g/dL   RDW 14.2 11.5 - 15.5 %   Platelets 412 (H) 150 - 400 K/uL  Basic metabolic panel     Status: Abnormal   Collection Time: 11/15/15  2:55 AM  Result Value Ref Range   Sodium 142 135 - 145 mmol/L   Potassium 3.8 3.5 - 5.1 mmol/L   Chloride 104 101 - 111 mmol/L   CO2 27 22 - 32 mmol/L   Glucose, Bld 123 (H) 65 - 99 mg/dL   BUN 35 (H) 6 - 20 mg/dL   Creatinine, Ser 1.06 0.61 - 1.24 mg/dL   Calcium 7.7 (L) 8.9 - 10.3 mg/dL   GFR calc non Af Amer >60 >60 mL/min   GFR calc Af  Amer >60 >60 mL/min   Anion gap 11 5 - 15  Magnesium     Status: None   Collection Time: 11/15/15  2:55 AM  Result Value Ref Range   Magnesium 1.9 1.7 - 2.4 mg/dL  Phosphorus     Status:  None   Collection Time: 11/15/15  2:55 AM  Result Value Ref Range   Phosphorus 3.7 2.5 - 4.6 mg/dL  Glucose, capillary     Status: Abnormal   Collection Time: 11/15/15  6:21 AM  Result Value Ref Range   Glucose-Capillary 118 (H) 65 - 99 mg/dL   Dg Chest Port 1 View  11/15/2015  CLINICAL DATA:  Acute respiratory failure, shortness of Breath EXAM: PORTABLE CHEST 1 VIEW COMPARISON:  11/14/2015 FINDINGS: Prior CABG. Right subclavian central line tip in the SVC, unchanged. Cardiomegaly. Right lower lobe airspace opacity. No confluent opacity on the left. No effusions. No acute bony abnormality. IMPRESSION: Increasing right infrahilar/lower lobe airspace opacity. Cannot exclude pneumonia. Stable cardiomegaly. Electronically Signed   By: Rolm Baptise M.D.   On: 11/15/2015 07:20   Dg Chest Port 1 View  11/14/2015  CLINICAL DATA:  Acute respiratory failure EXAM: PORTABLE CHEST 1 VIEW COMPARISON:  11/13/2015 FINDINGS: Cardiomegaly again noted. Right central line with tip in SVC again noted. Status post CABG. Slight improvement in aeration. Residual mild interstitial prominence in right upper lobe perihilar and right infrahilar region probable residual mild interstitial edema. No segmental infiltrates. IMPRESSION: Right central line with tip in SVC again noted. Status post CABG. Slight improvement in aeration. Residual mild interstitial prominence in right upper lobe perihilar and right infrahilar region probable residual mild interstitial edema. Electronically Signed   By: Lahoma Crocker M.D.   On: 11/14/2015 09:19    Assessment/Plan: Diagnosis: L-AKA Labs and images independently reviewed.  Records reviewed and summated above. PT/OT for mobility, ADL's, strengthening,and wheelchair training Clean amputation daily with soap and water Monitor incision site for signs of infection or impending skin breakdown. Staples to remain in place for 3-4 weeks Stump shrinker, for edema control  Scar mobilization  massaging to prevent soft tissue adherence Stump protector during therapies Prevent flexion contractures by implementing the following:   Encourage prone lying for 20-30 mins per day BID to avoid hip flexion  Contractures if medically appropriate;  Avoid prolonged sitting Post surgical pain control with oral medication Phantom limb pain control with physical modalities including desensitization techniques (gentle self massage to the residual stump,hot packs if sensation iintact, [place]) and mirror therapy, TENS. If ineffective, consider pharmacological treatment for neuropathic pain (e.g gabapentin, pregabalin, amytriptalyine, duloxetine).  Avoid injury to contralateral side  1. Does the need for close, 24 hr/day medical supervision in concert with the patient's rehab needs make it unreasonable for this patient to be served in a less intensive setting? Yes 2. Co-Morbidities requiring supervision/potential complications: CAD (cont meds), CAF - chronic coumadin (monitor HR with increasing physical activity), DM type 2 with peripheral neuropathy (Monitor in accordance with exercise and adjust meds as necessary), PAD (cont meds), right foot wound/callus (cont to monitor and ensure appropriate healing to avoid further complications), CHF (Monitor in accordance with increased physical activity and avoid UE resistance excercises), HTN (monitor and provide prns in accordance with increased physical exertion and pain), Tachycardia (monitor in accordance with pain and increasing activity), leukocytosis (cont to monitor and consider further workup if necessary), ABLA (transfuse if necessary to ensure appropriate perfusion for increased activity tolerance) 3. Due to bladder management, safety, skin/wound care,  disease management, medication administration, pain management and patient education, does the patient require 24 hr/day rehab nursing? Yes 4. Does the patient require coordinated care of a physician, rehab nurse,  PT (1-2 hrs/day, 5 days/week) and OT (1-2 hrs/day, 5 days/week) to address physical and functional deficits in the context of the above medical diagnosis(es)? Yes Addressing deficits in the following areas: balance, endurance, locomotion, strength, transferring, bowel/bladder control, bathing, dressing, toileting and psychosocial support 5. Can the patient actively participate in an intensive therapy program of at least 3 hrs of therapy per day at least 5 days per week? Potentially 6. The potential for patient to make measurable gains while on inpatient rehab is good 7. Anticipated functional outcomes upon discharge from inpatient rehab are TBD  with PT, TBD with OT, N/A with SLP. 8. Estimated rehab length of stay to reach the above functional goals is: TBD 9. Does the patient have adequate social supports and living environment to accommodate these discharge functional goals? Potentially 10. Anticipated D/C setting: Home 11. Anticipated post D/C treatments: HH therapy and Home excercise program 12. Overall Rehab/Functional Prognosis: excellent  RECOMMENDATIONS: This patient's condition is appropriate for continued rehabilitative care in the following setting: Will await formal therapy consults, however, likely CIR once medically stable. (improvement in leukocytosis, stable vitals with therapies, etc.) Patient has agreed to participate in recommended program. Yes Note that insurance prior authorization may be required for reimbursement for recommended care.  Comment: Rehab Admissions Coordinator to follow up.  Delice Lesch, MD 11/15/2015

## 2015-11-15 NOTE — Progress Notes (Addendum)
[facility] Progress Note  Subjective  - POD #4  Pain is better. Still feels as if there is a left foot there. Wound on right foot does not cause him any pain or discomfort.  Objective Filed Vitals:   11/14/15 2134 11/15/15 0430  BP: 121/51 146/83  Pulse: 88 78  Temp: 97.5 F (36.4 C) 98 F (36.7 C)  Resp: 19 19    Intake/Output Summary (Last 24 hours) at 11/15/15 0759 Last data filed at 11/15/15 0449  Gross per 24 hour  Intake   1250 ml  Output   3776 ml  Net  -2526 ml    Incision clean, dry and intact with no erythema or warmth. No evidence of infection. Wound on bottom of right foot is non-erythematous, unchanged from prior exam   Assessment/Planning: 80 y.o. male is s/p:  4 Days Post-Op   WBC at 12.8 from 12.3.  CXR shows increasing right infrahlar/lower lobe airspace opacity. Possible pneumonia. To be managed by primary care team Continue abx according to primary care team Incision is looking well Will continue to monitor wound on right foot and left leg incision  Checkovich, Kayla 11/15/2015 7:59 AM -- Addendum  Agree with above. Left AKA healing well. Some mild erythema posterior stump. Current abx will cover this. Continue abx per ID.  Possible pneumonia on CXR. Per primary.  Will monitor right foot wound. Looks unchanged from last exam. Patient has been having callous debrided by podiatrist prior to admission.   Virgina Jock, PA-C  Laboratory CBC    Component Value Date/Time   WBC 12.7* 11/15/2015 0255   HGB 11.8* 11/15/2015 0255   HCT 34.9* 11/15/2015 0255   PLT 412* 11/15/2015 0255    BMET    Component Value Date/Time   NA 142 11/15/2015 0255   K 3.8 11/15/2015 0255   CL 104 11/15/2015 0255   CO2 27 11/15/2015 0255   GLUCOSE 123* 11/15/2015 0255   BUN 35* 11/15/2015 0255   CREATININE 1.06 11/15/2015 0255   CREATININE 0.84 06/09/2013 1228   CALCIUM 7.7* 11/15/2015 0255   GFRNONAA >60 11/15/2015 0255   GFRAA >60  11/15/2015 0255    COAG Lab Results  Component Value Date   INR 1.98* 11/11/2015   INR 1.89* 11/11/2015   INR 1.90* 11/11/2015   No results found for: PTT  Antibiotics Anti-infectives    Start     Dose/Rate Route Frequency Ordered Stop   11/14/15 1400  ceFAZolin (ANCEF) IVPB 2 g/50 mL premix     2 g 100 mL/hr over 30 Minutes Intravenous 3 times per day 11/14/15 0956     11/11/15 1030  linezolid (ZYVOX) IVPB 600 mg  Status:  Discontinued     600 mg 300 mL/hr over 60 Minutes Intravenous Every 12 hours 11/11/15 0947 11/14/15 0956   11/10/15 1400  clindamycin (CLEOCIN) IVPB 900 mg  Status:  Discontinued     900 mg 100 mL/hr over 30 Minutes Intravenous 3 times per day 11/10/15 1100 11/11/15 0947   11/10/15 1230  imipenem-cilastatin (PRIMAXIN) 500 mg in sodium chloride 0.9 % 100 mL IVPB  Status:  Discontinued     500 mg 200 mL/hr over 30 Minutes Intravenous 3 times per day 11/10/15 1122 11/13/15 1127   11/09/15 1230  vancomycin (VANCOCIN) 1,500 mg in sodium chloride 0.9 % 500 mL IVPB  Status:  Discontinued     1,500 mg 250 mL/hr over 120 Minutes Intravenous Every 24 hours 11/08/15 1202 11/11/15 0947  11/08/15 1400  aztreonam (AZACTAM) 1 g in dextrose 5 % 50 mL IVPB  Status:  Discontinued     1 g 100 mL/hr over 30 Minutes Intravenous 3 times per day 11/08/15 1202 11/10/15 1100   11/08/15 1215  vancomycin (VANCOCIN) 1,750 mg in sodium chloride 0.9 % 500 mL IVPB     1,750 mg 250 mL/hr over 120 Minutes Intravenous  Once 11/08/15 1202 11/08/15 1449      Virgina Jock, PA-C [facility] Office: [phone] Pager: [phone] 11/15/2015 7:59 AM  Agree with above.  PTx/ CIR  Deitra Mayo, MD, [place] [phone] Office: [phone]

## 2015-11-15 NOTE — Progress Notes (Signed)
[facility]   Reason for visit: Follow up on cellulitis  Interval History: started on cefazolin, no rash, no diarrhea.  Ready to go to rehab.  Afebrile.   Physical Exam: Constitutional:  Filed Vitals:   11/14/15 2134 11/15/15 0430  BP: 121/51 146/83  Pulse: 88 78  Temp: 97.5 F (36.4 C) 98 F (36.7 C)  Resp: 19 19   patient appears in NAD Respiratory: Normal respiratory effort; CTA B Cardiovascular: RRR MS: no erythema upper left leg, mild erythema at stump  Review of Systems: Constitutional: negative for fevers and chills Gastrointestinal: negative for nausea and diarrhea Genitourinary: negative for hesitancy and decreased stream  Lab Results  Component Value Date   WBC 12.7* 11/15/2015   HGB 11.8* 11/15/2015   HCT 34.9* 11/15/2015   MCV 94.3 11/15/2015   PLT 412* 11/15/2015    Lab Results  Component Value Date   CREATININE 1.06 11/15/2015   BUN 35* 11/15/2015   NA 142 11/15/2015   K 3.8 11/15/2015   CL 104 11/15/2015   CO2 27 11/15/2015    Lab Results  Component Value Date   ALT 39 11/11/2015   AST 37 11/11/2015   ALKPHOS 131* 11/11/2015     Microbiology: Recent Results (from the past 240 hour(s))  Culture, blood (routine x 2)     Status: None   Collection Time: 11/08/15 10:32 AM  Result Value Ref Range Status   Specimen Description BLOOD RIGHT ANTECUBITAL  Final   Special Requests BOTTLES DRAWN AEROBIC AND ANAEROBIC 5CC  Final   Culture NO GROWTH 5 DAYS  Final   Report Status 11/14/2015 FINAL  Final  Culture, blood (routine x 2)     Status: None   Collection Time: 11/08/15 11:42 AM  Result Value Ref Range Status   Specimen Description BLOOD RIGHT ANTECUBITAL  Final   Special Requests BOTTLES DRAWN AEROBIC AND ANAEROBIC 5CCS  Final   Culture NO GROWTH 5 DAYS  Final   Report Status 11/13/2015 FINAL  Final  Anaerobic culture     Status: None (Preliminary result)   Collection Time: 11/10/15  9:44 PM  Result Value Ref  Range Status   Specimen Description ABSCESS LEFT FOOT  Final   Special Requests THIRD METATARSAL  Final   Gram Stain   Final    MODERATE WBC PRESENT, PREDOMINANTLY PMN NO SQUAMOUS EPITHELIAL CELLS SEEN ABUNDANT GRAM POSITIVE COCCI IN PAIRS Performed at [facility]    Culture   Final    NO ANAEROBES ISOLATED; CULTURE IN PROGRESS FOR 5 DAYS Performed at [facility]    Report Status PENDING  Incomplete  Culture, routine-abscess     Status: None   Collection Time: 11/10/15  9:44 PM  Result Value Ref Range Status   Specimen Description ABSCESS LEFT FOOT  Final   Special Requests THIRD METATARSAL  Final   Gram Stain   Final    ABUNDANT WBC PRESENT,BOTH PMN AND MONONUCLEAR NO SQUAMOUS EPITHELIAL CELLS SEEN MODERATE GRAM POSITIVE COCCI IN PAIRS Performed at [facility]    Culture   Final    MODERATE STAPHYLOCOCCUS AUREUS Note: RIFAMPIN AND GENTAMICIN SHOULD NOT BE USED AS SINGLE DRUGS FOR TREATMENT OF STAPH INFECTIONS. Performed at [facility]    Report Status 11/14/2015 FINAL  Final   Organism ID, Bacteria STAPHYLOCOCCUS AUREUS  Final      Susceptibility   Staphylococcus aureus - MIC*    CLINDAMYCIN <=0.25 SENSITIVE Sensitive  ERYTHROMYCIN 0.5 SENSITIVE Sensitive     GENTAMICIN <=0.5 SENSITIVE Sensitive     LEVOFLOXACIN 0.25 SENSITIVE Sensitive     OXACILLIN 0.5 SENSITIVE Sensitive     RIFAMPIN <=0.5 SENSITIVE Sensitive     TRIMETH/SULFA <=10 SENSITIVE Sensitive     VANCOMYCIN 1 SENSITIVE Sensitive     TETRACYCLINE <=1 SENSITIVE Sensitive     MOXIFLOXACIN <=0.25 SENSITIVE Sensitive     * MODERATE STAPHYLOCOCCUS AUREUS  MRSA PCR Screening     Status: None   Collection Time: 11/11/15 10:10 AM  Result Value Ref Range Status   MRSA by PCR NEGATIVE NEGATIVE Final    Comment:        The GeneXpert MRSA Assay (FDA approved for NASAL specimens only), is one component of a comprehensive MRSA colonization surveillance program. It is  not intended to diagnose MRSA infection nor to guide or monitor treatment for MRSA infections.   C difficile quick scan w PCR reflex     Status: None   Collection Time: 11/14/15  2:47 PM  Result Value Ref Range Status   C Diff antigen NEGATIVE NEGATIVE Final   C Diff toxin NEGATIVE NEGATIVE Final   C Diff interpretation Negative for toxigenic C. difficile  Final    Impression/Plan:  1. Cellulitis - severe requiring AKA, grew MSSA.  May have been toxin producing. Tolerating cefazolin and can continue for 14 days total post surgery.   Ok to pull picc line/central line at the end of treatment. Antibiotics per rehab protocol 2. dispo - going to rehab.   We are available for follow up if needed. thanks

## 2015-11-15 NOTE — Progress Notes (Signed)
Physical Therapy Evaluation Patient Details Name: [name] MRN: [id number] DOB: 1935/12/28 Today's Date: 11/15/2015   History of Present Illness  pt is a 80 y/o male with h/o ASCVD, Afib, arthrities, DM with peripheral neuropathy, HTN, PVD, MI, Jaw CA with reconstruction admitted with infection of L LE that rapidly progressed with eventual need for L AKA  1/19.  Extubated 1/20.  Clinical Impression  Pt admitted with/for L leg infection, s/p AKA.  Pt currently limited functionally due to the problems listed. ( See problems list.)   Pt will benefit from PT to maximize function and safety in order to get ready for next venue listed below.     Follow Up Recommendations CIR    Equipment Recommendations  Other (comment) (TBA next venue)    Recommendations for Other Services Rehab consult     Precautions / Restrictions Precautions Precautions: Fall Restrictions Weight Bearing Restrictions: No      Mobility  Bed Mobility Overal bed mobility: Needs Assistance Bed Mobility: Supine to Sit     Supine to sit: Min guard        Transfers Overall transfer level: Needs assistance   Transfers: Sit to/from Stand;Squat Pivot Transfers;Stand Pivot Transfers Sit to Stand: Mod assist Stand pivot transfers: Min assist Squat pivot transfers: Min assist     General transfer comment: mod assist to help both come forward and power up., but once up uses RW well to pivot.  Stability assist for squat transfers.  Ambulation/Gait                Stairs            Wheelchair Mobility    Modified Rankin (Stroke Patients Only)       Balance Overall balance assessment: Needs assistance Sitting-balance support: No upper extremity supported;Bilateral upper extremity supported Sitting balance-Leahy Scale: Fair Sitting balance - Comments: tends to fall posteriorly with excessive challenge to balance.     Standing balance-Leahy Scale: Poor                               Pertinent Vitals/Pain Pain Assessment: Faces Faces Pain Scale: Hurts little more Pain Location: L residual limb Pain Descriptors / Indicators: Sore Pain Intervention(s): Limited activity within patient's tolerance    Home Living Family/patient expects to be discharged to:: Private residence Living Arrangements: Spouse/significant other   Type of Home: House Home Access: Stairs to enter Entrance Stairs-Rails: Psychiatric nurse of Steps: 3 Home Layout: One level;Other (Comment) (but with two 1 step transitions between rooms in the house) Home Equipment: Gilford Rile - 2 wheels;Bedside commode;Crutches      Prior Function Level of Independence: Independent         Comments: active     Hand Dominance        Extremity/Trunk Assessment   Upper Extremity Assessment: Overall WFL for tasks assessed;Defer to OT evaluation           Lower Extremity Assessment: RLE deficits/detail;LLE deficits/detail RLE Deficits / Details: generally functional strength with some proximal weakness LLE Deficits / Details: moves against gravity and in all planess without assist, but shakey with pain and weakness.     Communication   Communication: No difficulties  Cognition Arousal/Alertness: Awake/alert Behavior During Therapy: WFL for tasks assessed/performed Overall Cognitive Status: Within Functional Limits for tasks assessed  General Comments      Exercises Amputee Exercises Hip Extension: AAROM;AROM;Strengthening;Both;10 reps;Supine Hip ABduction/ADduction: AROM;Strengthening;Both;10 reps;Supine Hip Flexion/Marching: AROM;Strengthening;Both;10 reps;Supine      Assessment/Plan    PT Assessment Patient needs continued PT services  PT Diagnosis Difficulty walking;Generalized weakness;Acute pain   PT Problem List Decreased strength;Decreased activity tolerance;Decreased balance;Decreased mobility;Decreased knowledge of use of  DME;Decreased knowledge of precautions;Pain  PT Treatment Interventions DME instruction;Gait training;Functional mobility training;Therapeutic activities;Therapeutic exercise;Balance training;Patient/family education   PT Goals (Current goals can be found in the Care Plan section) Acute Rehab PT Goals Patient Stated Goal: get a leg, be able to stay active. PT Goal Formulation: With patient Time For Goal Achievement: 11/29/15 Potential to Achieve Goals: Good    Frequency Min 4X/week   Barriers to discharge        Co-evaluation               End of Session   Activity Tolerance: Patient tolerated treatment well Patient left: in chair;with call bell/phone within reach Nurse Communication: Mobility status         Time: 2122-4825 PT Time Calculation (min) (ACUTE ONLY): 40 min   Charges:   PT Evaluation $PT Eval Moderate Complexity: 1 Procedure PT Treatments $Therapeutic Activity: 23-37 mins   PT G Codes:        [name], Tessie Fass 11/15/2015, 3:44 PM I 11/15/2015  Donnella Sham, PT [phone]  (pager)

## 2015-11-15 NOTE — Progress Notes (Signed)
Central line was removed per order. Catheter was intact, pressure applied for 5 minutes and dressing was put on.Patient instructed to remain in bed for thirty minutes and to keep dressing on for 24 hours. Will continue to monitor.

## 2015-11-15 NOTE — Progress Notes (Signed)
Inpatient Rehabilitation  I met with the patient at the bedside to discuss recommendation for CIR with pt. and wife.  I provided informational booklets and answered their questions.  Pt. and family agreeable for CIR.  I may not have a bed to offer pt. if he should be medically ready for discharge tomorrow.  I will follow up tomorrow.  I have updated Elenor Quinones, RNCM and [employer], CSW of need for SNF backup.  Please call if questions.  [facility] Admissions Coordinator Cell [phone] Office [phone]

## 2015-11-15 NOTE — Care Management Important Message (Signed)
Important Message  Patient Details  Name: [name] MRN: [id number] Date of Birth: 03-07-1936   Medicare Important Message Given:  Yes    Barb Merino Gabriana Wilmott 11/15/2015, 2:33 PM

## 2015-11-15 NOTE — Progress Notes (Signed)
Utilization review completed.  

## 2015-11-16 ENCOUNTER — Inpatient Hospital Stay
Admission: RE | Admit: 2015-11-16 | Discharge: 2015-12-08 | Disposition: A | Discharge status: Home / Self Care | Payer: Medicare Other | Source: Ambulatory Visit | Attending: Internal Medicine | Admitting: Internal Medicine

## 2015-11-16 LAB — CBC
HEMATOCRIT: 35 % — AB (ref 39.0–52.0)
HEMOGLOBIN: 11.4 g/dL — AB (ref 13.0–17.0)
MCH: 30.7 pg (ref 26.0–34.0)
MCHC: 32.6 g/dL (ref 30.0–36.0)
MCV: 94.3 fL (ref 78.0–100.0)
Platelets: 410 10*3/uL — ABNORMAL HIGH (ref 150–400)
RBC: 3.71 MIL/uL — AB (ref 4.22–5.81)
RDW: 14.2 % (ref 11.5–15.5)
WBC: 10.5 10*3/uL (ref 4.0–10.5)

## 2015-11-16 LAB — GLUCOSE, CAPILLARY
GLUCOSE-CAPILLARY: 111 mg/dL — AB (ref 65–99)
Glucose-Capillary: 115 mg/dL — ABNORMAL HIGH (ref 65–99)

## 2015-11-16 LAB — ANAEROBIC CULTURE

## 2015-11-16 LAB — BASIC METABOLIC PANEL
Anion gap: 9 (ref 5–15)
BUN: 35 mg/dL — AB (ref 6–20)
CHLORIDE: 106 mmol/L (ref 101–111)
CO2: 24 mmol/L (ref 22–32)
CREATININE: 1.04 mg/dL (ref 0.61–1.24)
Calcium: 7.7 mg/dL — ABNORMAL LOW (ref 8.9–10.3)
GFR calc Af Amer: >60 mL/min (ref >60)
GFR calc non Af Amer: >60 mL/min (ref >60)
Glucose, Bld: 128 mg/dL — ABNORMAL HIGH (ref 65–99)
POTASSIUM: 3.7 mmol/L (ref 3.5–5.1)
SODIUM: 139 mmol/L (ref 135–145)

## 2015-11-16 LAB — PT FACTOR INHIBITOR (MIXING STUDY)
1 HR INCUB PT 1:1NP: 11.6 s — ABNORMAL HIGH (ref 9.6–11.5)
PT 1:1NP: 11.7 s — ABNORMAL HIGH (ref 9.6–11.5)
PT: 18.2 s — ABNORMAL HIGH (ref 9.6–11.5)

## 2015-11-16 LAB — PROTIME-INR
INR: 1.75 — ABNORMAL HIGH (ref 0.00–1.49)
PROTHROMBIN TIME: 20.4 s — AB (ref 11.6–15.2)

## 2015-11-16 MED ORDER — BOOST / RESOURCE BREEZE PO LIQD: 1.0000 | Freq: Three times a day (TID) | ORAL | Status: DC

## 2015-11-16 MED ORDER — ACETAMINOPHEN 325 MG PO TABS: 650.0000 mg | ORAL_TABLET | Freq: Four times a day (QID) | ORAL | Status: DC | PRN

## 2015-11-16 MED ORDER — TRAMADOL HCL 50 MG PO TABS: 50.0000 mg | ORAL_TABLET | Freq: Four times a day (QID) | ORAL | Status: DC | PRN

## 2015-11-16 MED ORDER — CEPHALEXIN 500 MG PO CAPS
500.0000 mg | ORAL_CAPSULE | Freq: Four times a day (QID) | ORAL | Status: DC
Start: 2015-11-16 — End: 2015-11-30

## 2015-11-16 MED ORDER — PRO-STAT SUGAR FREE PO LIQD: 30.0000 mL | Freq: Two times a day (BID) | ORAL | Status: DC

## 2015-11-16 MED ORDER — SILVER SULFADIAZINE 1 % EX CREA: TOPICAL_CREAM | Freq: Every day | CUTANEOUS | Status: DC

## 2015-11-16 NOTE — Progress Notes (Signed)
Physical Therapy Treatment Patient Details Name: [name] MRN: [id number] DOB: 11/16/1935 Today's Date: 11/16/2015    History of Present Illness pt is a 80 y/o male with h/o ASCVD, Afib, arthrities, DM with peripheral neuropathy, HTN, PVD, MI, Jaw CA with reconstruction admitted with infection of L LE that rapidly progressed with eventual need for L AKA  1/19.  Extubated 1/20.    PT Comments    Progressing better than expected Emphasis on transfer safety and gait with education on amputee issue and rewrapping the stump.  Follow Up Recommendations  SNF     Equipment Recommendations   (TBA in SNF rehab)    Recommendations for Other Services       Precautions / Restrictions Precautions Precautions: Fall Restrictions Weight Bearing Restrictions: No    Mobility  Bed Mobility Overal bed mobility: Needs Assistance Bed Mobility: Supine to Sit     Supine to sit: Supervision Sit to supine: Supervision   General bed mobility comments: heavy reliance on rail  Transfers Overall transfer level: Needs assistance Equipment used: Rolling walker (2 wheeled) Transfers: Sit to/from Stand Sit to Stand: Min assist;Mod assist         General transfer comment: mod assist to help both come forward and power up.  Ambulation/Gait Ambulation/Gait assistance: Min assist Ambulation Distance (Feet): 5 Feet (sidestepping L/R x3, 35 feet) Assistive device: Rolling walker (2 wheeled) Gait Pattern/deviations: Step-to pattern   Gait velocity interpretation: Below normal speed for age/gender General Gait Details: generally steady, but occasional unsteadiness moving RW and turning in place to change direction   Stairs            Wheelchair Mobility    Modified Rankin (Stroke Patients Only)       Balance     Sitting balance-Leahy Scale: Good Sitting balance - Comments: better stability EOB   Standing balance support: Bilateral upper extremity supported Standing  balance-Leahy Scale: Poor                      Cognition Arousal/Alertness: Awake/alert Behavior During Therapy: WFL for tasks assessed/performed Overall Cognitive Status: Within Functional Limits for tasks assessed                      Exercises      General Comments        Pertinent Vitals/Pain Pain Assessment: Faces Faces Pain Scale: Hurts a little bit Pain Location: L LE Pain Descriptors / Indicators: Sore Pain Intervention(s): Monitored during session    Home Living Family/patient expects to be discharged to:: Private residence Living Arrangements: Spouse/significant other Available Help at Discharge: Family;Available 24 hours/day (wife will take a medical leave) Type of Home: House Home Access: Stairs to enter Entrance Stairs-Rails: Right;Left Home Layout: One level (2 one step transitions between rooms) Home Equipment: Gilford Rile - 2 wheels;Bedside commode;Crutches      Prior Function Level of Independence: Independent      Comments: active   PT Goals (current goals can now be found in the care plan section) Acute Rehab PT Goals Patient Stated Goal: get a leg, be able to stay active. PT Goal Formulation: With patient Time For Goal Achievement: 11/29/15 Potential to Achieve Goals: Good Progress towards PT goals: Progressing toward goals    Frequency       PT Plan Discharge plan needs to be updated    Co-evaluation             End of Session  Activity Tolerance: Patient tolerated treatment well Patient left: in bed;with call bell/phone within reach;with family/visitor present     Time: 1214-1249 PT Time Calculation (min) (ACUTE ONLY): 35 min  Charges:  $Gait Training: 8-22 mins $Therapeutic Activity: 8-22 mins                    G Codes:      [name], Tessie Fass 11/16/2015, 12:59 PM  11/16/2015  Donnella Sham, PT [phone]  (pager)

## 2015-11-16 NOTE — Progress Notes (Addendum)
Patient discharged to SNF via ambulance. A&Ox3. Wife in company of patient. Report Given to Victoriano Lain, LPN

## 2015-11-16 NOTE — Evaluation (Signed)
Occupational Therapy Evaluation Patient Details Name: [name] MRN: [id number] DOB: Dec 17, 1935 Today's Date: 11/16/2015    History of Present Illness pt is a 80 y/o male with h/o ASCVD, Afib, arthrities, DM with peripheral neuropathy, HTN, PVD, MI, Jaw CA with reconstruction admitted with infection of L LE that rapidly progressed with eventual need for L AKA  1/19.  Extubated 1/20.   Clinical Impression   Pt was active and independent prior to admission.  Presents with mild post operative soreness, impaired balance and generalized weakness interfering with ability to perform ADL and ADL transfers.  Pt is highly motivated to return to his active lifestyle and will likely make excellent progress with intensive rehab.  Recommending CIR.  Will follow.   Follow Up Recommendations  CIR    Equipment Recommendations  Tub/shower bench    Recommendations for Other Services       Precautions / Restrictions Precautions Precautions: Fall Restrictions Weight Bearing Restrictions: No      Mobility Bed Mobility Overal bed mobility: Needs Assistance Bed Mobility: Supine to Sit;Sit to Supine     Supine to sit: Supervision Sit to supine: Supervision   General bed mobility comments: heavy reliance on rail  Transfers Overall transfer level: Needs assistance Equipment used: Rolling walker (2 wheeled) Transfers: Sit to/from Stand Sit to Stand: Mod assist              Balance     Sitting balance-Leahy Scale: Fair       Standing balance-Leahy Scale: Poor                              ADL Overall ADL's : Needs assistance/impaired Eating/Feeding: Independent;Bed level   Grooming: Wash/dry hands;Wash/dry face;Sitting;Supervision/safety   Upper Body Bathing: Supervision/ safety;Sitting   Lower Body Bathing: Sitting/lateral leans;Moderate assistance   Upper Body Dressing : Supervision/safety;Sitting   Lower Body Dressing: Sitting/lateral leans;Moderate  assistance   Toilet Transfer: Minimal assistance;Stand-pivot;BSC;RW   Toileting- Clothing Manipulation and Hygiene: Sitting/lateral lean;Moderate assistance               Vision     Perception     Praxis      Pertinent Vitals/Pain Pain Assessment: Faces Faces Pain Scale: Hurts a little bit Pain Location: L LE Pain Descriptors / Indicators: Sore Pain Intervention(s): Monitored during session;Repositioned     Hand Dominance Right   Extremity/Trunk Assessment Upper Extremity Assessment Upper Extremity Assessment: Overall WFL for tasks assessed   Lower Extremity Assessment Lower Extremity Assessment: Defer to PT evaluation       Communication Communication Communication: No difficulties   Cognition Arousal/Alertness: Awake/alert Behavior During Therapy: WFL for tasks assessed/performed Overall Cognitive Status: Within Functional Limits for tasks assessed                     General Comments       Exercises       Shoulder Instructions      Home Living Family/patient expects to be discharged to:: Private residence Living Arrangements: Spouse/significant other Available Help at Discharge: Family;Available 24 hours/day (wife will take a medical leave) Type of Home: House Home Access: Stairs to enter [employer] of Steps: 3 Entrance Stairs-Rails: Right;Left Home Layout: One level (2 one step transitions between rooms)     Bathroom Shower/Tub: Tub/shower unit;Curtain   Bathroom Toilet: Handicapped height     Home Equipment: Environmental consultant - 2 wheels;Bedside commode;Crutches  Prior Functioning/Environment Level of Independence: Independent        Comments: active    OT Diagnosis: Generalized weakness;Acute pain   OT Problem List: Decreased strength;Decreased activity tolerance;Impaired balance (sitting and/or standing);Decreased knowledge of use of DME or AE;Pain   OT Treatment/Interventions: Self-care/ADL training;DME  and/or AE instruction;Therapeutic activities;Balance training;Patient/family education    OT Goals(Current goals can be found in the care plan section) Acute Rehab OT Goals Patient Stated Goal: get a leg, be able to stay active. OT Goal Formulation: With patient Time For Goal Achievement: 11/30/15 Potential to Achieve Goals: Good ADL Goals Pt Will Perform Grooming: with supervision;standing (oral care, handwashing in standing, seated shaving) Pt Will Perform Lower Body Bathing: with supervision;sit to/from stand Pt Will Perform Lower Body Dressing: with supervision;sit to/from stand Pt Will Transfer to Toilet: with supervision;ambulating (BSC over toilet) Pt Will Perform Toileting - Clothing Manipulation and hygiene: with supervision;sit to/from stand Pt Will Perform Tub/Shower Transfer: Tub transfer;with supervision;ambulating;tub bench;rolling walker  OT Frequency: Min 2X/week   Barriers to D/C:            Co-evaluation              End of Session    Activity Tolerance: Patient tolerated treatment well Patient left: in bed;with call bell/phone within reach;with family/visitor present   Time: 4469-5072 OT Time Calculation (min): 19 min Charges:  OT General Charges $OT Visit: 1 Procedure OT Evaluation $OT Eval Moderate Complexity: 1 Procedure G-Codes:    Malka So 11/16/2015, 10:16 AM [phone]

## 2015-11-16 NOTE — Progress Notes (Addendum)
   VASCULAR SURGERY ASSESSMENT & PLAN:  * 5 Days Post-Op s/p: Left AKA  * Appreciate PTx's help  *  Awaiting bed in CIR  * Back on Coumadin.  * Given mild erythema of L AKA, it would be helpful to continue the po antibiotics from my standpoint.   SUBJECTIVE: Pain well controlled.  PHYSICAL EXAM: Filed Vitals:   11/15/15 1010 11/15/15 1321 11/15/15 2117 11/16/15 0537  BP: 149/71 149/54 125/94 159/75  Pulse:  103 87 47  Temp:  97.5 F (36.4 C) 97.8 F (36.6 C) 97.5 F (36.4 C)  TempSrc:  Oral Oral Oral  Resp:   18 18  Height:      Weight:      SpO2:  97% 96% 95%   Left AKA healing nicely. Mild erythema.  LABS: Lab Results  Component Value Date   WBC 10.5 11/16/2015   HGB 11.4* 11/16/2015   HCT 35.0* 11/16/2015   MCV 94.3 11/16/2015   PLT 410* 11/16/2015   Lab Results  Component Value Date   CREATININE 1.04 11/16/2015   Lab Results  Component Value Date   INR 1.75* 11/16/2015   CBG (last 3)   Recent Labs  11/15/15 1646 11/15/15 2143 11/16/15 0627  GLUCAP 118* 120* 111*    Principal Problem:   Lower extremity cellulitis Active Problems:   Diabetes mellitus ([place])   Dyslipidemia   Essential hypertension   Permanent atrial fibrillation (HCC)   Hx of CABG   Chronic anticoagulation   PVD (peripheral vascular disease) with claudication (HCC)   Necrotic toes (HCC)   Pressure ulcer   Acute pulmonary edema (HCC)   Acute congestive heart failure (HCC)   Acute respiratory failure with hypoxia ([place])   Coronary artery disease involving coronary bypass graft of native heart without angina pectoris   Tachycardia   Leukocytosis   Abnormality of gait   Status post above knee amputation of left lower extremity ([place])   Acute blood loss anemia    Gae Gallop Beeper: [phone] 11/16/2015

## 2015-11-16 NOTE — Progress Notes (Signed)
Patient ID: [name], male   DOB: Aug 08, 1936, 80 y.o.   MRN: [id number]   Subjective: [name] is despondent about losing his leg. He tells me he feels as though it is on the ground, and he could just get up and walk. He tried some exercises yesterday and was "exhausted" but is motivated to continue physical therapy once he gets out. He'd like to go to a rehab facility in [place] that is about 2 minutes away from his house. He's hopeful he will get better but still seems shocked at losing his leg and all the events that transpired the last week. No fevers,shortness of breath, chest pain, or bleeding.  Objective: Vital signs in last 24 hours: Filed Vitals:   11/15/15 1010 11/15/15 1321 11/15/15 2117 11/16/15 0537  BP: 149/71 149/54 125/94 159/75  Pulse:  103 87 47  Temp:  97.5 F (36.4 C) 97.8 F (36.6 C) 97.5 F (36.4 C)  TempSrc:  Oral Oral Oral  Resp:   18 18  Height:      Weight:      SpO2:  97% 96% 95%   Physical exam: General: resting in bed comfortably, appropriately conversational but sad today Cardiac: iregular rate and rhythm, no rubs, murmurs or gallops Pulm: breathing well, clear to auscultation bilaterally Ext: left above knee amputation wrapped in gauze. The right plantar foot has large verrucous plaque that doesn't appear to be infected.  Lab Results: Basic Metabolic Panel:  Recent Labs Lab 11/14/15 0436 11/15/15 0255 11/16/15 0405  NA 143 142 139  K 3.9 3.8 3.7  CL 100* 104 106  CO2 30 27 24   GLUCOSE 111* 123* 128*  BUN 30* 35* 35*  CREATININE 1.14 1.06 1.04  CALCIUM 8.1* 7.7* 7.7*  MG 1.9 1.9  --   PHOS 3.8 3.7  --    CBC:  Recent Labs Lab 11/15/15 0255 11/16/15 0405  WBC 12.7* 10.5  HGB 11.8* 11.4*  HCT 34.9* 35.0*  MCV 94.3 94.3  PLT 412* 410*   Coagulation:  Recent Labs Lab 11/11/15 0930 11/11/15 1150 11/15/15 1224 11/16/15 0405  LABPROT 21.7*  21.6* 22.4* 20.0* 20.4*  INR 1.90*  1.89* 1.98* 1.70* 1.75*   Micro  Results: Recent Results (from the past 240 hour(s))  Culture, blood (routine x 2)     Status: None   Collection Time: 11/08/15 10:32 AM  Result Value Ref Range Status   Specimen Description BLOOD RIGHT ANTECUBITAL  Final   Special Requests BOTTLES DRAWN AEROBIC AND ANAEROBIC 5CC  Final   Culture NO GROWTH 5 DAYS  Final   Report Status 11/14/2015 FINAL  Final  Culture, blood (routine x 2)     Status: None   Collection Time: 11/08/15 11:42 AM  Result Value Ref Range Status   Specimen Description BLOOD RIGHT ANTECUBITAL  Final   Special Requests BOTTLES DRAWN AEROBIC AND ANAEROBIC 5CCS  Final   Culture NO GROWTH 5 DAYS  Final   Report Status 11/13/2015 FINAL  Final  Anaerobic culture     Status: None (Preliminary result)   Collection Time: 11/10/15  9:44 PM  Result Value Ref Range Status   Specimen Description ABSCESS LEFT FOOT  Final   Special Requests THIRD METATARSAL  Final   Gram Stain   Final    MODERATE WBC PRESENT, PREDOMINANTLY PMN NO SQUAMOUS EPITHELIAL CELLS SEEN ABUNDANT GRAM POSITIVE COCCI IN PAIRS Performed at [facility]    Culture   Final    NO  ANAEROBES ISOLATED; CULTURE IN PROGRESS FOR 5 DAYS Performed at [facility]    Report Status PENDING  Incomplete  Culture, routine-abscess     Status: None   Collection Time: 11/10/15  9:44 PM  Result Value Ref Range Status   Specimen Description ABSCESS LEFT FOOT  Final   Special Requests THIRD METATARSAL  Final   Gram Stain   Final    ABUNDANT WBC PRESENT,BOTH PMN AND MONONUCLEAR NO SQUAMOUS EPITHELIAL CELLS SEEN MODERATE GRAM POSITIVE COCCI IN PAIRS Performed at [facility]    Culture   Final    MODERATE STAPHYLOCOCCUS AUREUS Note: RIFAMPIN AND GENTAMICIN SHOULD NOT BE USED AS SINGLE DRUGS FOR TREATMENT OF STAPH INFECTIONS. Performed at [facility]    Report Status 11/14/2015 FINAL  Final   Organism ID, Bacteria STAPHYLOCOCCUS AUREUS  Final      Susceptibility    Staphylococcus aureus - MIC*    CLINDAMYCIN <=0.25 SENSITIVE Sensitive     ERYTHROMYCIN 0.5 SENSITIVE Sensitive     GENTAMICIN <=0.5 SENSITIVE Sensitive     LEVOFLOXACIN 0.25 SENSITIVE Sensitive     OXACILLIN 0.5 SENSITIVE Sensitive     RIFAMPIN <=0.5 SENSITIVE Sensitive     TRIMETH/SULFA <=10 SENSITIVE Sensitive     VANCOMYCIN 1 SENSITIVE Sensitive     TETRACYCLINE <=1 SENSITIVE Sensitive     MOXIFLOXACIN <=0.25 SENSITIVE Sensitive     * MODERATE STAPHYLOCOCCUS AUREUS  MRSA PCR Screening     Status: None   Collection Time: 11/11/15 10:10 AM  Result Value Ref Range Status   MRSA by PCR NEGATIVE NEGATIVE Final    Comment:        The GeneXpert MRSA Assay (FDA approved for NASAL specimens only), is one component of a comprehensive MRSA colonization surveillance program. It is not intended to diagnose MRSA infection nor to guide or monitor treatment for MRSA infections.   C difficile quick scan w PCR reflex     Status: None   Collection Time: 11/14/15  2:47 PM  Result Value Ref Range Status   C Diff antigen NEGATIVE NEGATIVE Final   C Diff toxin NEGATIVE NEGATIVE Final   C Diff interpretation Negative for toxigenic C. difficile  Final   Medications: I have reviewed the patient's current medications. Scheduled Meds: . amLODipine  10 mg Oral Daily  . antiseptic oral rinse  7 mL Mouth Rinse BID  . aspirin EC  81 mg Oral Daily  . cephALEXin  500 mg Oral 4 times per day  . feeding supplement  1 Container Oral TID BM  . feeding supplement (PRO-STAT SUGAR FREE 64)  30 mL Oral BID  . furosemide  40 mg Oral Daily  . losartan  100 mg Oral Daily   And  . hydrochlorothiazide  25 mg Oral Daily  . insulin aspart  0-5 Units Subcutaneous QHS  . insulin aspart  0-9 Units Subcutaneous TID WC  . nebivolol  5 mg Oral Daily  . omega-3 acid ethyl esters  1 g Oral Daily  . potassium chloride  40 mEq Oral Daily  . silver sulfADIAZINE   Topical Daily  . sodium chloride  3 mL Intravenous Q12H    . warfarin  2.5 mg Oral q1800  . Warfarin - Physician Dosing Inpatient   Does not apply q1800   Continuous Infusions:  PRN Meds:.acetaminophen, polyvinyl alcohol, sodium chloride, traMADol, zolpidem   Assessment/Plan:  Necrotizing cellulitis of left lower extremity: He got an above-knee amputation on January 19 and  has since been afebrile with a downtrending leukocytosis. Dr. Scot Dock looked at the wound and said there is some erythema at the stump; he agreed with continuing oral antibiotics. We'll continue cephalexin for a total of 14 days. He'll go to inpatient rehabilitation once a bed is available. It sounds like he would prefer to go to [place] to be close to home. -Thanks vascular surgery, infectious disease, and physical medicine for your help -Continue cephalexin 500 mg every 6 hours, stop date February 5 -Tramadol 50 mg every 6 hours as needed for pain  Chronic atrial fibrillation: Re-started warfarin 1/23. INR 1.73 today. -Continue warfarin 2.5 mg daily -Continue home nebivolol 5 mg daily -Will need INR checks at inpatient rehab  Ischemic heart failure with preserved ejection fraction: He went into acute pulmonary edema perioperatively after getting about 400 mL fluid with his FFP. His echo showed a normal ejection fraction of 55-60%. -Stopped oral furosemide 40 mg daily -Continue aspirin 81 mg daily  Hypertension: Pressures in the 120s-150s on his home medications besides clonidine patch. -Continue amlodipine 10 mg daily -Continue home losartan 100 mg daily -Continue home thiazide 25 mg daily -Continue furosemide 43m daily -Holding clonidine patch   Dispo: Discharge to inpatient rehabilitation once a bed is available.  The patient does have a current PCP (Lemmie Evens MD) and [facility].  The patient does have transportation limitations that hinder transportation to clinic appointments.  .Services Needed at  time of discharge: Y = Yes, Blank = No PT:   OT:   RN:   Equipment:   Other:     LOS: 8 days   KLoleta Chance MD 11/16/2015, 8:19 AM

## 2015-11-16 NOTE — Clinical Social Work Note (Signed)
Clinical Social Work Assessment  Patient Details  Name: [name] MRN: [id number] Date of Birth: [birth year]  Date of referral:  11/16/15               Reason for consult:  Facility Placement                Permission sought to share information with:  Family Supports Permission granted to share information::  Yes, Verbal Permission Granted  Name::     [name]::  [name]  Relationship::  wife  Contact Information:     Housing/Transportation Living arrangements for the past 2 months:  [place] of Information:  Patient, Spouse Patient Interpreter Needed:  None Criminal Activity/Legal Involvement Pertinent to Current Situation/Hospitalization:  No - Comment as needed Significant Relationships:  Spouse Lives with:  Spouse Do you feel safe going back to the place where you live?  No Need for family participation in patient care:  No (Coment)  Care giving concerns:  Pt lives at home with wife who is unable to provide sufficient assistance given current level of impairment   Facilities manager / plan:  CSW spoke with pt and wife at bedside concerning PT recommendation for rehab prior to return home- discussed CIR vs SNF and need for alternative plan if CIR is not able to admit pt when medically stable for DC  Employment status:  Retired Forensic scientist:  Medicare PT Recommendations:  Inpatient Rehab Consult Information / Referral to community resources:  [place]  Patient/Family's Response to care:  Pt and wife are agreeable to SNF search and would actually prefer SNF if pt can be placed at [facility]- if [facility] center is not an option would still like to pursue CIR  Patient/Family's Understanding of and Emotional Response to Diagnosis, Current Treatment, and Prognosis:  No questions or concerns- hopeful pt will make a quick recovery  Emotional Assessment Appearance:  Appears stated  age Attitude/Demeanor/Rapport:    Affect (typically observed):  Appropriate, Accepting Orientation:  Oriented to Self, Oriented to Place, Oriented to  Time, Oriented to Situation Alcohol / Substance use:  Not Applicable Psych involvement (Current and /or in the community):  No (Comment)  Discharge Needs  Concerns to be addressed:  Care Coordination Readmission within the last 30 days:  No Current discharge risk:  Physical Impairment Barriers to Discharge:  Continued Medical Work up   [employer], LCSW 11/16/2015, 10:08 AM

## 2015-11-16 NOTE — Progress Notes (Signed)
Inpatient Rehabilitation  I met with pt. and wife at the bedside to inform them I do not have a bed for pt. Today on CIR.   I spoke with Domenica Reamer, CSW and pt. has bed offers for SNF.   I anticipate pt. to DC to SNF later today.  Please call if questions.  [place] Admissions Coordinator Cell [phone] Office [phone]

## 2015-11-16 NOTE — Progress Notes (Signed)
Patient will discharge to [facility] Anticipated discharge date: 11/16/15 Family notified: wife at bedside Transportation by [facility]- scheduled for 1:30pm  CSW signing off.  Domenica Reamer, [place] Social Worker [phone]

## 2015-11-16 NOTE — Care Management Note (Signed)
Case Management Note Previous CM note initiated by Luz Lex RN, CM  Patient Details  Name: [name] MRN: [id number] Date of Birth: February 04, 1936  Subjective/Objective:     Post op toe amputation on 1-18 - unable to extubated.  To ICU on vent.                 Action/Plan: Tx out of ICU on 1/22//17- post op day 4- Left [facility] ,Continue abx per ID. Possible pneumonia on CXR per MD note-  CIR has been consulted, may need SNF as backup plan. PT/OT evals pending. CM will follow for d/c plans.   Expected Discharge Date:   11/16/15               Expected Discharge Plan:  IP Rehab Facility  In-House Referral:  Clinical Social Work  Discharge planning Services  CM Consult  Post Acute Care Choice:    Choice offered to:     DME Arranged:    DME Agency:     HH Arranged:    [place] Agency:     Status of Service:  Completed, signed off  Medicare Important Message Given:  Yes Date Medicare IM Given:    Medicare IM give by:    Date Additional Medicare IM Given:    Additional Medicare Important Message give by:     If discussed at [place] of Stay Meetings, dates discussed:    Discharge Disposition:  Skilled Facility   Additional Comments:  11/16/15- pt medically stable for discharge today- per CIR they do not have a bed for pt today, CSW working on SNF bed for pt- first choice [facility]. CSW following for placement needs.   Dawayne Patricia, RN 11/16/2015, 12:02 PM

## 2015-11-16 NOTE — Clinical Social Work Placement (Signed)
   CLINICAL SOCIAL WORK PLACEMENT  NOTE  Date:  11/16/2015  Patient Details  Name: [name] MRN: [id number] Date of Birth: 12-May-1936  Clinical Social Work is seeking post-discharge placement for this patient at the [place] level of care (*CSW will initial, date and re-position this form in  chart as items are completed):  Yes   Patient/family provided with [place] Work Department's list of facilities offering this level of care within the geographic area requested by the patient (or if unable, by the patient's family).  Yes   Patient/family informed of their freedom to choose among providers that offer the needed level of care, that participate in Medicare, Medicaid or managed care program needed by the patient, have an available bed and are willing to accept the patient.  Yes   Patient/family informed of [facility]'s ownership interest in [facility] and [facility], as well as of the fact that they are under no obligation to receive care at these facilities.  PASRR submitted to EDS on 11/16/15     PASRR number received on 11/16/15     Existing PASRR number confirmed on       FL2 transmitted to all facilities in geographic area requested by pt/family on 11/16/15     FL2 transmitted to all facilities within larger geographic area on       Patient informed that his/her managed care company has contracts with or will negotiate with certain facilities, including the following:            Patient/family informed of bed offers received.  Patient chooses bed at       Physician recommends and patient chooses bed at      Patient to be transferred to   on  .  Patient to be transferred to facility by       Patient family notified on   of transfer.  Name of family member notified:        PHYSICIAN Please sign FL2     Additional Comment:    _______________________________________________ Cranford Mon, LCSW 11/16/2015,  10:11 AM

## 2015-11-16 NOTE — Progress Notes (Signed)
Subjective: Mr. Mcilhenny seems to be in good spirits and reports that he is doing well this morning. He reports that his left leg is a little sore and that he feels as if his foot is still there, but denies significant pain.   Objective: Vital signs in last 24 hours: Filed Vitals:   11/15/15 1010 11/15/15 1321 11/15/15 2117 11/16/15 0537  BP: 149/71 149/54 125/94 159/75  Pulse:  103 87 47  Temp:  97.5 F (36.4 C) 97.8 F (36.6 C) 97.5 F (36.4 C)  TempSrc:  Oral Oral Oral  Resp:   18 18  Height:      Weight:      SpO2:  97% 96% 95%    Intake/Output Summary (Last 24 hours) at 11/16/15 8786 Last data filed at 11/16/15 0507  Gross per 24 hour  Intake      0 ml  Output   1800 ml  Net  -1800 ml   Exam: General: Sitting comfortably in his chair, eating breakfast and conversing with his wife Cardio: Irregular rate and rhythm, no murmurs, rubs or gallops.  Pulm/Chest: Lungs clear to auscultation bilaterally. No increased work of breathing.  MSK: Left above knee amputation. Right leg without lower extremity edema.  Psych: Bright affect.   Lab Results: BMP notable for Glu 128, BUN 35, Ca 7.7.  CBC notable for Hgb 11.4, Hct 35, Plt 410.  PT 20.4 / INR 1.75  Medications: Scheduled Meds: . amLODipine  10 mg Oral Daily  . antiseptic oral rinse  7 mL Mouth Rinse BID  . aspirin EC  81 mg Oral Daily  . cephALEXin  500 mg Oral 4 times per day  . feeding supplement  1 Container Oral TID BM  . feeding supplement (PRO-STAT SUGAR FREE 64)  30 mL Oral BID  . furosemide  40 mg Oral Daily  . losartan  100 mg Oral Daily   And  . hydrochlorothiazide  25 mg Oral Daily  . insulin aspart  0-5 Units Subcutaneous QHS  . insulin aspart  0-9 Units Subcutaneous TID WC  . nebivolol  5 mg Oral Daily  . omega-3 acid ethyl esters  1 g Oral Daily  . potassium chloride  40 mEq Oral Daily  . silver sulfADIAZINE   Topical Daily  . sodium chloride  3 mL Intravenous Q12H  . warfarin  2.5 mg Oral q1800    . Warfarin - Physician Dosing Inpatient   Does not apply q1800   PRN Meds:.acetaminophen, polyvinyl alcohol, sodium chloride, traMADol, zolpidem   Assessment/Plan: Mr. Akram is a 80 year-old man with history of coronary artery disease, atrial fibrillation, type 2 diabetes with neuropathy, and hypertension who presented with cellulitis of the left lower extremity, now s/p above knee amputation.   1. Necrotizing cellulitis of left lower extremity: S/p emergent above the knee amputation on 11/11/15 due to rapid spread of infection. His amputation site is healing nicely and he has only minimal pain. He has been transitioned to oral antibiotics and will need to complete a total course of two weeks, but otherwise is ready for transition to rehabilitation. Mr. Cambria and his wife would like for him to be discharged to a SNF rather than CIR so that they can be closer to home. Social work is working with them to arrange this.  - Cephalexin 500 mg PO every 6 hours, stop 11/28/15 - Tramadol 50 mg PO every 6 hours as needed for pain   2. Chronic atrial fibrillation:  Warfarin was restarted yesterday, INR was 1.75 today. He received quite a bit of vitamin K, so returning to his therapeutic dose may be slower than usual, but we expect to see some improvement in a few days.  - Warfarin 2.5 mg PO daily  - Continue home nebivolol 5 mg daily  - Continue home aspirin 81 mg daily - Continue daily PT/INR checks at SNF  3. Ischemic heart failure with preserved ejection fraction: Developed acute pulmonary on 11/13/15 after having received fluids and FFP prior to surgery. Subsequent echo showed ejection fraction of 50-55%. He has since been diuresed with IV diuretics and was transitioned to oral diuretics yesterday. Given his current euvolemia, we will discontinue diuretics today.  - Discontinue furosemide 40 mg PO daily   4. Hypertension: Pressures have mostly been in 161-096'E systolic while he has been here. We have  restarted home meds and expect to see further improvement.  - Continue amlodipine 10 mg daily  - Continue home losartan 100 mg daily - Continue home HCTZ 25 mg daily   5. Type 2 diabetes mellitus: Blood sugars have been  91-163 g/dL during this admission, Hgb A1c measured 5.7%.  - Discontinue sliding scale insulin upon discharge - Recommend outpatient follow up    This is a Careers information officer Note.  The care of the patient was discussed with Dr. Melburn Hake and the assessment and plan formulated with their assistance.  Please see their attached note for official documentation of the daily encounter.   LOS: 8 days     Everlene Balls, [place] [facility]  11/16/2015, 8:29 AM

## 2015-11-16 NOTE — [place] FL2 (Signed)
[place] MEDICAID FL2 LEVEL OF CARE SCREENING TOOL     IDENTIFICATION  Patient Name: [name] Birthdate: [birth year] Sex: male Admission Date (Current Location): 11/08/2015  [facility] [place] and [place] Number:  Herbalist and Address:  [facility] [place]. [facility], [place] [address], [place], [place] [address]      Provider Number: [id number]  Attending Physician Name and Address:  Annia Belt, MD  Relative Name and Phone Number:       Current Level of Care: Hospital Recommended Level of Care: [place] Prior Approval Number:    Date Approved/Denied:   PASRR Number: [id number]  Discharge Plan: SNF    Current Diagnoses: Patient Active Problem List   Diagnosis Date Noted  . Coronary artery disease involving coronary bypass graft of native heart without angina pectoris   . Tachycardia   . Leukocytosis   . Abnormality of gait   . Status post above knee amputation of left lower extremity ([place])   . Acute blood loss anemia   . Acute pulmonary edema (HCC)   . Acute congestive heart failure ([place])   . Acute respiratory failure with hypoxia ([place])   . Pressure ulcer 11/09/2015  . Necrotic toes ([place]) 11/08/2015  . Lower extremity cellulitis 11/08/2015  . Bradycardia 05/28/2015  . PVD (peripheral vascular disease) with claudication ([place]) 09/30/2014  . Encounter for therapeutic drug monitoring 11/26/2013  . Peripheral vascular disease ([place]) 08/06/2012  . Chronic anticoagulation 01/20/2011  . Diabetes mellitus ([place]) 03/04/2010  . Dyslipidemia 03/04/2010  . Essential hypertension 03/04/2010  . Hx of CABG 03/04/2010  . Permanent atrial fibrillation ([place]) 04/08/2009    Orientation RESPIRATION BLADDER Height & Weight    Self, Time, Situation, Place  Normal Indwelling catheter 5' 8"  (172.7 cm) 174 lbs.  BEHAVIORAL SYMPTOMS/MOOD NEUROLOGICAL BOWEL NUTRITION STATUS      Continent Diet (carb modified)  AMBULATORY STATUS COMMUNICATION OF  NEEDS Skin   Extensive Assist Verbally PU Stage and Appropriate Care PU Stage 1 Dressing:  (every 5 days) PU Stage 2 Dressing:  (PRN)                   Personal Care Assistance Level of Assistance  Bathing, Dressing Bathing Assistance: Limited assistance   Dressing Assistance: Limited assistance     Functional Limitations Info             SPECIAL CARE FACTORS FREQUENCY  PT (By licensed PT)     PT Frequency: 5/wk              Contractures      Additional Factors Info  Code Status, Allergies, Insulin Sliding Scale Code Status Info: FULL Allergies Info: Lipitor, Simvastatin, Penicillins, Sulfa Antibiotics   Insulin Sliding Scale Info: 4/day       Current Medications (11/16/2015):  This is the current hospital active medication list Current Facility-Administered Medications  Medication Dose Route Frequency Provider Last Rate Last Dose  . acetaminophen (TYLENOL) tablet 650 mg  650 mg Oral Q6H PRN Norval Gable, MD   650 mg at 11/09/15 2057  . amLODipine (NORVASC) tablet 10 mg  10 mg Oral Daily Praveen Mannam, MD   10 mg at 11/15/15 1011  . antiseptic oral rinse (CPC / CETYLPYRIDINIUM CHLORIDE 0.05%) solution 7 mL  7 mL Mouth Rinse BID Praveen Mannam, MD   7 mL at 11/15/15 2200  . aspirin EC tablet 81 mg  81 mg Oral Daily Loleta Chance, MD   81 mg  at 11/15/15 1322  . cephALEXin (KEFLEX) capsule 500 mg  500 mg Oral 4 times per day Loleta Chance, MD   500 mg at 11/16/15 8676  . feeding supplement (BOOST / RESOURCE BREEZE) liquid 1 Container  1 Container Oral TID BM Rogue Bussing, RD   1 Container at 11/15/15 2302  . feeding supplement (PRO-STAT SUGAR FREE 64) liquid 30 mL  30 mL Oral BID Rogue Bussing, RD   30 mL at 11/15/15 2302  . furosemide (LASIX) tablet 40 mg  40 mg Oral Daily Skeet Simmer, [facility]      . losartan (COZAAR) tablet 100 mg  100 mg Oral Daily Loleta Chance, MD   100 mg at 11/15/15 1323   And  . hydrochlorothiazide (HYDRODIURIL) tablet 25  mg  25 mg Oral Daily Loleta Chance, MD   25 mg at 11/15/15 1323  . insulin aspart (novoLOG) injection 0-5 Units  0-5 Units Subcutaneous QHS Juliet Rude, MD   0 Units at 11/08/15 2309  . insulin aspart (novoLOG) injection 0-9 Units  0-9 Units Subcutaneous TID WC Juliet Rude, MD   1 Units at 11/14/15 1305  . nebivolol (BYSTOLIC) tablet 5 mg  5 mg Oral Daily Loleta Chance, MD   5 mg at 11/15/15 1323  . omega-3 acid ethyl esters (LOVAZA) capsule 1 g  1 g Oral Daily Loleta Chance, MD   1 g at 11/15/15 1322  . polyvinyl alcohol (LIQUIFILM TEARS) 1.4 % ophthalmic solution 1 drop  1 drop Both Eyes PRN Rigoberto Noel, MD   1 drop at 11/13/15 0947  . potassium chloride SA (K-DUR,KLOR-CON) CR tablet 40 mEq  40 mEq Oral Daily Zada Finders, MD   40 mEq at 11/15/15 1011  . silver sulfADIAZINE (SILVADENE) 1 % cream   Topical Daily Marshell Garfinkel, MD   1 application at 19/50/93 0952  . sodium chloride 0.9 % injection 10-40 mL  10-40 mL Intracatheter PRN Kara Mead V, MD      . sodium chloride 0.9 % injection 3 mL  3 mL Intravenous Q12H Juliet Rude, MD   3 mL at 11/15/15 2306  . traMADol (ULTRAM) tablet 50 mg  50 mg Oral Q6H PRN Ulyses Amor, PA-C   50 mg at 11/15/15 2304  . warfarin (COUMADIN) tablet 2.5 mg  2.5 mg Oral q1800 Loleta Chance, MD   2.5 mg at 11/15/15 1824  . Warfarin - Physician Dosing Inpatient   Does not apply q1800 Skeet Simmer, RPH   0  at 11/15/15 1800  . zolpidem (AMBIEN) tablet 5 mg  5 mg Oral QHS PRN Annia Belt, MD   5 mg at 11/15/15 2305     Discharge Medications: Please see discharge summary for a list of discharge medications.  Relevant Imaging Results:  Relevant Lab Results:   Additional Information SS#: [id number]  [place] Mon,

## 2015-11-17 ENCOUNTER — Encounter (HOSPITAL_COMMUNITY)
Admission: AD | Admit: 2015-11-17 | Discharge: 2015-11-17 | Disposition: A | Discharge status: Home / Self Care | Payer: Medicare Other | Source: Skilled Nursing Facility | Attending: Internal Medicine | Admitting: Internal Medicine

## 2015-11-17 ENCOUNTER — Non-Acute Institutional Stay (SKILLED_NURSING_FACILITY): Payer: Medicare Other | Admitting: Internal Medicine

## 2015-11-17 DIAGNOSIS — I10 Essential (primary) hypertension: Secondary | ICD-10-CM | POA: Diagnosis not present

## 2015-11-17 DIAGNOSIS — I482 Chronic atrial fibrillation: Secondary | ICD-10-CM

## 2015-11-17 DIAGNOSIS — I4821 Permanent atrial fibrillation: Secondary | ICD-10-CM

## 2015-11-17 DIAGNOSIS — J810 Acute pulmonary edema: Secondary | ICD-10-CM

## 2015-11-17 DIAGNOSIS — Z89612 Acquired absence of left leg above knee: Secondary | ICD-10-CM | POA: Diagnosis not present

## 2015-11-17 DIAGNOSIS — D72829 Elevated white blood cell count, unspecified: Secondary | ICD-10-CM | POA: Diagnosis not present

## 2015-11-17 LAB — CBC
HCT: 35.3 % — ABNORMAL LOW (ref 39.0–52.0)
HEMOGLOBIN: 11.8 g/dL — AB (ref 13.0–17.0)
MCH: 32 pg (ref 26.0–34.0)
MCHC: 33.4 g/dL (ref 30.0–36.0)
MCV: 95.7 fL (ref 78.0–100.0)
Platelets: 394 10*3/uL (ref 150–400)
RBC: 3.69 MIL/uL — ABNORMAL LOW (ref 4.22–5.81)
RDW: 14.2 % (ref 11.5–15.5)
WBC: 11.7 10*3/uL — ABNORMAL HIGH (ref 4.0–10.5)

## 2015-11-17 LAB — BASIC METABOLIC PANEL
Anion gap: 9 (ref 5–15)
BUN: 42 mg/dL — AB (ref 6–20)
CHLORIDE: 107 mmol/L (ref 101–111)
CO2: 23 mmol/L (ref 22–32)
CREATININE: 0.97 mg/dL (ref 0.61–1.24)
Calcium: 7.7 mg/dL — ABNORMAL LOW (ref 8.9–10.3)
GFR calc Af Amer: >60 mL/min (ref >60)
GLUCOSE: 107 mg/dL — AB (ref 65–99)
POTASSIUM: 4.3 mmol/L (ref 3.5–5.1)
Sodium: 139 mmol/L (ref 135–145)

## 2015-11-17 LAB — PROTIME-INR
INR: 1.84 — AB (ref 0.00–1.49)
PROTHROMBIN TIME: 21.2 s — AB (ref 11.6–15.2)

## 2015-11-17 NOTE — Progress Notes (Signed)
Patient ID: [name], male   DOB: 03/06/1936, 80 y.o.   MRN: [id number]   This is an acute visit.  Level care skilled.  Facility is [facility], operational complaint-acute visit status post hospitalization for left above-the-knee amputation secondary to left lower extremity necrotizing cellulitis.  History of present illness.  Patient is a pleasant 80 year old male with a complex medical history including coronary artery disease status post CABG and stent placement-chronic atrial fibrillation-and type 2 diabetes.  Patient presented to the hospital with increased erythema of his left lower extremity.  He was also having chills and night sweats.  Eventually notices second toe was turning black.  He went to his podiatrist who referred him to the emergency department.  He was diagnosed with lower extremity necrotizing cellulitis--this was complicated with a history of severe peripheral vascular disease.  Marland Kitchen  He did receive a left second toe amputation-shortly thereafter he went into flash pulmonary edema was transferred to the ICU.  A TTE showed a normal ejection fraction of 55-60 percent.  The following day did undergo an above-the-knee amputation of the left lower extremity.  His wound culture did grow out MSSA-leukocytosis improved he was discharged on a course of cephalexin for a stop date of February 5.  In regards to chronic atrial fibrillation his INR had to be reversed before the surgery-.  There was difficulty reversing the INR with vitamin K and FFP-it was thought at one time he might be going into DIC but his fibrinogen level was actually elevated-also consider cessation for possible lupus anticoagulant or factor VIII inhibitor-those in attempt to perform a PTT mixing study but it was never obtained.  INR initially became subtherapeutic-he was resumed on his Coumadin after the surgery. --Blood pressure today is 154/78 it was 138/70 previously-he had been on clonidine  patch at one time this apparently was discontinued during his hospitalization.   In regards to hypertension his blood pressures were erratic-apparently his meds had to be held at times  Currently he has no complaints he is sating his chair comfortably previous medical history.  Previous medical history.  Left lower extremity necrotizing cellulitis status post left above-the-knee amputation.  Chronic atrial fibrillation.  Hypertension.  History of osteoarthritis.  History of peripheral neuropathy secondary to type 2 diabetes.  History of squamous cell carcinoma left jaw.  Peripheral vascular disease.  Bilateral cataracts.  .  Past surgical history.  History of CABG 1990s.  Squamous cell carcinoma excision 1993.  Tonsillectomy.  ORIF to be a fracture.  Mandibular hardware removal back in 08/02/2015.  Family history.  Significant for heart disease in mother.  Social history.  Patient is married note significant history of tobacco or smokeless tobacco alcohol or illicit drug use.  Medications.  Tylenol 650 mg every 6 hours when necessary.  Norvasc 10 mg daily.  Aspirin 81 mg daily.  Centrum Silver daily.  Keflex 500 mg every 6 hours.  Durezol eyedrops.  Ferrous sulfate 325 mg daily.  Fish oil 1000 mg capsule daily.  Coumadin 2.5 mg daily.  Hyzaar 100-25 milligrams daily.  Diastolic 5 mg daily.  Silvadene topically daily daily.  Tramadol 50 mg every 6 hours when necessary pain.   Review of systems.  In general does not complaining any fever or chills.  Skin does not complain of rashes or itching dressing is over his lateral right foot area he is status post left above-the-knee amputation that is stress currently as well.  Eyes does not complain of any  visual changes or drainage.  Ear nose mouth and throat does not complain of sore throat or nasal drainage.  Respiratory is not complaining of shortness breath or cough.  Cardiac does not  complaining of chest pain he does not appear to have significant lower extremity edema.  GI no complaint of nausea vomiting diarrhea constipation or abdominal pain.  GU is not complaining of dysuria.  Muscle skeletal is not currently complaining of joint pain or pain of the amputation site.  Neurologic is not complaining of dizziness or headache does have a history of peripheral neuropathy of his extremities.  Psych is not complaining of depression or anxiety.  Physical exam.  Temperature 98.1 pulse 64 respirations 20 blood pressure 154/78-138/70 in this range.  In general this is a pleasant elderly male in no distress sitting comfortably in his chair.  Skin is warm and dry there is some dressing over his lateral right foot area also his left above-the-knee amputation site.  Eyes pupils appear reactive sclera and conjunctiva are clear visual acuity appears grossly intact.  Oropharynx clear mucous membranes moist.  Chest is clear to auscultation there is no labored breathing.  Heart is irregular irregular rate and rhythm without murmur gallop or rub he does not appear to have significant lower extremity edema on the right.  His abdomen soft nontender positive bowel sounds.  Muscle skeletal is status post left above-the-knee amputation site is currently dressed otherwise able to move all extremities he is ambulating in a wheelchair I do not note any deformities other than arthritic.  Neurologic is grossly intact to speech is clear no lateralizing findings.  Psych he is alert and oriented pleasant and appropriate appears to be an accurate historian.  Labs.  11/17/2015.  Sodium 139 potassium 4.3 BUN 42 creatinine 0.97.  WBC 11.7 hemoglobin 11.8 platelets 394.  INR 1.84 this appears to be trending up.  Assessment and plan.  History of left lower extremity cellulitis status post left above-the-knee amputation he appears to have tolerated the procedure well he is continuing  on Keflex-he will need continued PT and OT with follow-up by surgery.  He is receiving tramadol for pain.  #2 history of atrial fibrillation this appears rate controlled he is on bi-stolid he is on Coumadin for anticoagulation INR is slightly subtherapeutic but appears to be trending up will update this tomorrow continue his current dose.  #3 history of anemia suspect there is an element of postop blood loss here he is on iron.  #4-hypertension-his clonidine patch was DC'd in the hospital-she has somewhat variable blood pressures as noted above at this point will continue to monitor he is on bi-systolic is also on Norvasc 10 mg daily and Hyzaar 100-25 milligrams daily.  #5-history diabetes type 2 this appears to be diet controlled will order CBGs twice a day call provider if CBG is less than 60 or greater than 250.  #6 as history leukocytosis it appears white count was slightly elevated at 11.7 today it appears during hospitalization ran from 12.7-down to 10.5 most recently Will recheck this tomorrow with follow-up by Dr. Dellia Nims later in the week.  #7 history of flash pulmonary edema status post recent surger-she does not really show evidence of that currently at this point will monitor.  [id number] note greater than 40 minutes spent assessing patient-reviewing his chart-and coordinating and formulating plan of care for numerous diagnoses- greater than 50% of time spent coordinating plan of care

## 2015-11-18 ENCOUNTER — Encounter (HOSPITAL_COMMUNITY)
Admission: AD | Admit: 2015-11-18 | Discharge: 2015-11-18 | Disposition: A | Discharge status: Home / Self Care | Payer: Medicare Other | Source: Skilled Nursing Facility | Attending: Internal Medicine | Admitting: Internal Medicine

## 2015-11-18 LAB — CBC WITH DIFFERENTIAL/PLATELET
BASOS ABS: 0.1 10*3/uL (ref 0.0–0.1)
Basophils Relative: 1 %
EOS ABS: 0.2 10*3/uL (ref 0.0–0.7)
EOS PCT: 2 %
HCT: 34.7 % — ABNORMAL LOW (ref 39.0–52.0)
HEMOGLOBIN: 11.4 g/dL — AB (ref 13.0–17.0)
LYMPHS PCT: 11 %
Lymphs Abs: 1.2 10*3/uL (ref 0.7–4.0)
MCH: 31.7 pg (ref 26.0–34.0)
MCHC: 32.9 g/dL (ref 30.0–36.0)
MCV: 96.4 fL (ref 78.0–100.0)
Monocytes Absolute: 1.1 10*3/uL — ABNORMAL HIGH (ref 0.1–1.0)
Monocytes Relative: 10 %
NEUTROS PCT: 77 %
Neutro Abs: 8.8 10*3/uL — ABNORMAL HIGH (ref 1.7–7.7)
PLATELETS: 375 10*3/uL (ref 150–400)
RBC: 3.6 MIL/uL — AB (ref 4.22–5.81)
RDW: 14.2 % (ref 11.5–15.5)
WBC: 11.4 10*3/uL — AB (ref 4.0–10.5)

## 2015-11-18 LAB — PROTIME-INR
INR: 1.66 — ABNORMAL HIGH (ref 0.00–1.49)
PROTHROMBIN TIME: 19.7 s — AB (ref 11.6–15.2)

## 2015-11-19 ENCOUNTER — Encounter (HOSPITAL_COMMUNITY)
Admission: AD | Admit: 2015-11-19 | Discharge: 2015-11-19 | Disposition: A | Discharge status: Home / Self Care | Payer: Medicare Other | Source: Skilled Nursing Facility | Attending: Internal Medicine | Admitting: Internal Medicine

## 2015-11-19 LAB — PROTIME-INR
INR: 1.88 — ABNORMAL HIGH (ref 0.00–1.49)
Prothrombin Time: 21.5 seconds — ABNORMAL HIGH (ref 11.6–15.2)

## 2015-11-20 ENCOUNTER — Non-Acute Institutional Stay (SKILLED_NURSING_FACILITY): Payer: Medicare Other | Admitting: Internal Medicine

## 2015-11-20 ENCOUNTER — Encounter (HOSPITAL_COMMUNITY)
Admission: RE | Admit: 2015-11-20 | Discharge: 2015-11-20 | Disposition: A | Discharge status: Home / Self Care | Payer: Medicare Other | Source: Skilled Nursing Facility | Attending: Internal Medicine | Admitting: Internal Medicine

## 2015-11-20 DIAGNOSIS — E1142 Type 2 diabetes mellitus with diabetic polyneuropathy: Secondary | ICD-10-CM

## 2015-11-20 DIAGNOSIS — J810 Acute pulmonary edema: Secondary | ICD-10-CM | POA: Diagnosis not present

## 2015-11-20 DIAGNOSIS — I482 Chronic atrial fibrillation, unspecified: Secondary | ICD-10-CM

## 2015-11-20 DIAGNOSIS — Z89612 Acquired absence of left leg above knee: Secondary | ICD-10-CM

## 2015-11-20 DIAGNOSIS — Z7901 Long term (current) use of anticoagulants: Secondary | ICD-10-CM | POA: Insufficient documentation

## 2015-11-20 LAB — PROTIME-INR
INR: 2.35 — ABNORMAL HIGH (ref 0.00–1.49)
PROTHROMBIN TIME: 25.5 s — AB (ref 11.6–15.2)

## 2015-11-21 ENCOUNTER — Encounter: Payer: Self-pay | Admitting: Internal Medicine

## 2015-11-22 ENCOUNTER — Encounter (HOSPITAL_COMMUNITY)
Admission: RE | Admit: 2015-11-22 | Discharge: 2015-11-22 | Disposition: A | Discharge status: Home / Self Care | Payer: Medicare Other | Source: Skilled Nursing Facility | Attending: Internal Medicine | Admitting: Internal Medicine

## 2015-11-22 LAB — PROTIME-INR
INR: 2.83 — AB (ref 0.00–1.49)
PROTHROMBIN TIME: 29.3 s — AB (ref 11.6–15.2)

## 2015-11-23 NOTE — Progress Notes (Addendum)
Patient ID: [name], male   DOB: 03-Mar-1936, 80 y.o.   MRN: [id number]                HISTORY & PHYSICAL  DATE:  11/20/2015          FACILITY: [facility]              LEVEL OF CARE:   SNF   CHIEF COMPLAINT:  Admission to SNF, post stay at [facility], 11/08/2015 through 11/16/2015.    HISTORY OF PRESENT ILLNESS:  This is a 80 year-old man who is a type 2 diabetic with diabetic neuropathy, but not currently on any treatment.  He was at one point on metformin.  However, he had diet-induced weight loss and was able to come off this some time ago.    The patient tells me that he had his toenails trimmed at the podiatrist's two weeks before his admission, but there were no lesions on his feet and his wife looks at his feet daily.    Apparently, the patient noticed left foot swelling three days before admission.  His foot became red and tender, and the redness started to spread up his leg.  He then noticed that his left second toe was turning black.  He went to see his podiatrist, who referred him to the ER.    He had had previous vascular evaluation that showed noncompressible vessels and a TBI of 0.46 on the left and 0.76 on the right.    Unfortunately, his original MRI showed an abscess on the dorsum of his foot at his metatarsals.  He had a left third toe amputation.    However, postoperatively, he went into heart failure.  He required intubation.  A TTE showed a normal ejection fraction.    The condition in his left leg deteriorated with rapidly progressive erythema up his leg.  He ultimately required an above-knee amputation.  He stabilized.  His wound cultures grew methicillin-sensitive Staph aureus.  Postoperatively, he defervesced.   His leukocytosis improved and he was discharged on a 14-day course of cephalexin.    Notable for the fact that the patient is on Coumadin for chronic atrial fibrillation.  This was difficult to reverse during his septic phase.  Most  recently, his INR was 1.88 on 3.5 mg.  This was changed to 3 mg yesterday.    PAST MEDICAL HISTORY/PROBLEM LIST:   Past Medical History  Diagnosis Date  . Arteriosclerotic cardiovascular disease (ASCVD)     CABG in 1990s; 2002 total obstruction of LAD, CX and RCA with patent grafts and nl EF; Stress nuc. 2008 - mild LV dilation; normal EF; questionable small anteroapical scar; no ischemia  . Atrial fibrillation ([place])   . Chronic anticoagulation   . Arthritis     "left knee" (11/08/2015)  . Pedal edema     "not a lot", per pt.  . Diabetic peripheral neuropathy (HCC)     bilateral lower leg, hands  . Hypertension     states under control with meds., has been on med. x "long time"  . Peripheral vascular disease ([place])   . 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 Dental crowns present   . Runny nose 07/27/2015    clear drainage, per pt.  . Myocardial infarction ([place]) 1990s    "after my jaw OR"  . Type II diabetes mellitus ([place])     "diet controlled" (11/08/2015)  . Iron deficiency  anemia   . History of blood transfusion     "related to my leg surgery?"  . Cellulitis 11/08/2015    "both feet"  . Jaw cancer ([place]) 1990s    "squamous cell"       PAST SURGICAL HISTORY:    Past Surgical History  Procedure Laterality Date  . Squamous cell carcinoma excision Left 1993    "took my jaw out; got cadavar in there now"  . Tonsillectomy    . Orif tibia fracture Left ~ 1970  . Colonoscopy N/A 11/27/2014    Procedure: COLONOSCOPY;  Surgeon: Rogene Houston, MD;  [facility];  Service: Endoscopy;  Laterality: N/A;  830  . Incision and drainage abscess Left 06/16/2005    wide exc. abscess 5th toe  . Mandibular hardware removal Left 08/02/2015    Procedure:  HARDWARE REMOVAL TWO MANDIBULAR SCREWS;  Surgeon: Jannette Fogo, DDS;  Location: [place];  Service: Oral Surgery;  Laterality: Left;  . Fracture surgery    . Cataract extraction w/ intraocular  lens  implant, bilateral Bilateral 09/2015  . Cardiac catheterization  1990s; 04/16/2001  . Coronary artery bypass graft  1990's    "CABG X4"  . I&d extremity Left 11/10/2015    Procedure: INCISION AND DRAINAGE LEFT FOOT, AMPUTATION OF LEFT THIRD TOE;  Surgeon: Conrad [place] Haven, MD;  Location: [place];  Service: Vascular;  Laterality: Left;  . Amputation Left 11/11/2015    Procedure: LEFT ABOVE KNEE AMPUTATION;  Surgeon: Angelia Mould, MD;  Location: [place];  Service: Vascular;  Laterality: Left;               CURRENT MEDICATIONS:  Medication list is reviewed.              Tylenol 650 q.6 p.r.n.    Norvasc 10 q.d.      ASA 81 q.d.      Cephalexin (Keflex) 500 q.6 hours until February 3rd.    Durezol 0.05%, 1 drop into eye two times a day.    Ferrous sulfate 65 daily.    Fish oil 1000 daily.    Warfarin as directed.    Hyzaar 100/25, 1 tablet daily.    Bystolic 5 mg daily.      Silvadene daily.     Tramadol 50 q.6 hours p.r.n.             SOCIAL HISTORY:                   HOUSING:  The patient lives in [place].   He has two stairs to get into his home.  There is apparently a split level.  He lives with his wife.     FUNCTIONAL STATUS:  The patient was obviously independent before all of this.  He is a diabetic, but not on any treatment.   TOBACCO USE:  He is a lifelong nonsmoker.    FAMILY HISTORY:   Family History  Problem Relation Age of Onset  . Heart disease Mother                 REVIEW OF SYSTEMS:       CHEST/RESPIRATORY:  No shortness of breath.   CARDIAC:  No chest pain.    GI:  No abdominal pain.    No diarrhea.    GU:  No dysuria.   Wife concerned about a tineal rash in his groin.  She already has Nystatin powder.  SKIN:  He has chronic venous insufficiency with venous stasis in the right leg.  This is not new.    He has an area on the right lateral malleolus that is open, presumably a pressure area in the hospital.  He has several small hemorrhages in  the right leg.  I am thinking that this is probably from a compression pump in the hospital.    PHYSICAL EXAMINATION:   GENERAL APPEARANCE:  The patient looks well.  Conversational and alert.   HEENT:   MOUTH/THROAT:  Oral exam is normal.     CHEST/RESPIRATORY:  Exam is clear.       CARDIOVASCULAR:   CARDIAC:  Heart sounds are irregular.  1/6 systolic ejection murmur sounds benign.     GASTROINTESTINAL:   LIVER/SPLEEN/KIDNEY:   No liver, no spleen.  No tenderness.    GENITOURINARY:   BLADDER:  Not distended.     MUSCULOSKELETAL:    EXTREMITIES:   LEFT LOWER EXTREMITY:  His left above-knee amputation site looks clean.  Staples are in.  The edema is controlled.     RIGHT LOWER EXTREMITY:  He has venous insufficiency to  of the way up towards the knee.  His peripheral pulses are palpable in the foot.  Popliteal and femoral pulses are normal.   SKIN:   INSPECTION:  He has an unstageable small area over the right lateral malleolus.  There is surface slough over this, but no surrounding infection.  He has several areas of subdermal hemorrhage which I think are probably secondary to compression wraps in the right leg.  He has what looks to be spider angiomata on his chest.  Arms:  He has scaling over his knuckles of both hands.  His nails are very dystrophic.  I almost wondered about psoriasis.    ASSESSMENT/PLAN:             Left BKA in the setting of type 2 diabetes with diabetic neuropathy.  His hemoglobin A1c in the hospital was 5.7.    Diabetic ulcer on the right lateral malleolus.  This may be a pressure area.  This will need some careful attention in the facility and also when he goes home.    Diabetic neuropathy, as discussed.  He does not need any current therapy.    Groin rash.  This is tineal.  The Nystatin powder is fine here.    Stage I pressure area over the coccyx.  He will need to be positioned carefully.  He had a foam cover over this.  I do not think this is necessary.   Pressure relief is the key here.    Flash pulmonary edema in the hospital.  His echocardiogram showed a normal EF.  His heart rate is currently controlled.    Chronic atrial fibrillation.  On Coumadin.  I will need to adjust his Coumadin.  I am not currently sure what dose of Coumadin he is on.  His INR today is 2.35, I think on 3 mg.  The patient tells me he was on Coumadin 2.5 on Monday, Wednesday, and Friday, and a whole tablet, presumably 5 mg, on the other days.

## 2015-11-24 ENCOUNTER — Encounter (HOSPITAL_COMMUNITY)
Admission: AD | Admit: 2015-11-24 | Discharge: 2015-11-24 | Disposition: A | Discharge status: Home / Self Care | Payer: Medicare Other | Source: Skilled Nursing Facility | Attending: Internal Medicine | Admitting: Internal Medicine

## 2015-11-24 DIAGNOSIS — N182 Chronic kidney disease, stage 2 (mild): Secondary | ICD-10-CM | POA: Insufficient documentation

## 2015-11-24 DIAGNOSIS — E1121 Type 2 diabetes mellitus with diabetic nephropathy: Secondary | ICD-10-CM | POA: Insufficient documentation

## 2015-11-24 DIAGNOSIS — Z792 Long term (current) use of antibiotics: Secondary | ICD-10-CM | POA: Insufficient documentation

## 2015-11-24 DIAGNOSIS — Z7901 Long term (current) use of anticoagulants: Secondary | ICD-10-CM | POA: Insufficient documentation

## 2015-11-24 LAB — PROTIME-INR
INR: 2.06 — AB (ref 0.00–1.49)
PROTHROMBIN TIME: 23.1 s — AB (ref 11.6–15.2)

## 2015-11-29 ENCOUNTER — Encounter (HOSPITAL_COMMUNITY)
Admission: RE | Admit: 2015-11-29 | Discharge: 2015-11-29 | Disposition: A | Discharge status: Home / Self Care | Payer: Medicare Other | Source: Skilled Nursing Facility | Attending: Internal Medicine | Admitting: Internal Medicine

## 2015-11-29 LAB — PROTIME-INR
INR: 3.09 — AB (ref 0.00–1.49)
PROTHROMBIN TIME: 31.3 s — AB (ref 11.6–15.2)

## 2015-11-30 ENCOUNTER — Non-Acute Institutional Stay (SKILLED_NURSING_FACILITY): Payer: Medicare Other | Admitting: Internal Medicine

## 2015-11-30 ENCOUNTER — Encounter: Payer: Self-pay | Admitting: Internal Medicine

## 2015-11-30 DIAGNOSIS — Z7901 Long term (current) use of anticoagulants: Secondary | ICD-10-CM | POA: Diagnosis not present

## 2015-11-30 DIAGNOSIS — J810 Acute pulmonary edema: Secondary | ICD-10-CM

## 2015-11-30 DIAGNOSIS — I482 Chronic atrial fibrillation: Secondary | ICD-10-CM

## 2015-11-30 DIAGNOSIS — R635 Abnormal weight gain: Secondary | ICD-10-CM | POA: Diagnosis not present

## 2015-11-30 DIAGNOSIS — D72829 Elevated white blood cell count, unspecified: Secondary | ICD-10-CM | POA: Diagnosis not present

## 2015-11-30 DIAGNOSIS — I4821 Permanent atrial fibrillation: Secondary | ICD-10-CM

## 2015-11-30 NOTE — Progress Notes (Signed)
Patient ID: [name], male   DOB: October 20, 1936, 80 y.o.   MRN: [id number]     This is an acute visit.  Level care skilled.  Facility is [facility], operational complaint- Acute visit secondary numerous issues including  weight gain--follow-up anticoagulation management with history of A. fib-. Leukocytosis-insomnia  History of present illness.  Patient is a pleasant 80 year old male with a complex medical history including coronary artery disease status post CABG and stent placement-chronic atrial fibrillation-and type 2 diabetes.  Patient presented to the hospital with increased erythema of his left lower extremity.  He was also having chills and night sweats.  Eventually notices second toe was turning black.  He went to his podiatrist who referred him to the emergency department.  He was diagnosed with lower extremity necrotizing cellulitis--this was complicated with a history of severe peripheral vascular disease.  Marland Kitchen  He did receive a left second toe amputation-shortly thereafter he went into flash pulmonary edema was transferred to the ICU.  A TTE showed a normal ejection fraction of 55-60 percent.  The following day did undergo an above-the-knee amputation of the left lower extremity.  His wound culture did grow out MSSA-leukocytosis improved he was discharged on a course of cephalexin f  He appears to be doing quite well in this regards no fever chills doing well with therapy.  Of note he does have a history of atrial fibrillation is on chronic Coumadin as well as bysystolic for rate control--INR yesterday was slightly supratherapeutic at 3.09 we have decreased his Coumadin dose from 3 mg down to 2.5 and update INR is pending  He is also eating quite well nursing staff has noted apparently a weight gain of possibly around 7 pounds over the past 2 weeks-he did have flash pulmonary edema again in the hospital ejection fraction was within normal range-a BNP was done which  was 1070.  He did receive Lasix in the hospital but has been quite stable regards since his stay here.  He does not complain of any increased shortness of breath even with therapy-I do not see any significant increased edema.  Patient also has a history of some mild leukocytosis this appears to be somewhat chronic since his hospitalization he is not complaining of any fever or chills or dysuria.  Of note he also is complaining of insomnia his melatonin has been increased as of yesterday   .      Previous medical history.  Left lower extremity necrotizing cellulitis status post left above-the-knee amputation.  Chronic atrial fibrillation.  Hypertension.  History of osteoarthritis.  History of peripheral neuropathy secondary to type 2 diabetes.  History of squamous cell carcinoma left jaw.  Peripheral vascular disease.  Bilateral cataracts.  .  Past surgical history.  History of CABG 1990s.  Squamous cell carcinoma excision 1993.  Tonsillectomy.  ORIF to be a fracture.  Mandibular hardware removal back in 08/02/2015.  Family history.  Significant for heart disease in mother.  Social history.  Patient is married note significant history of tobacco or smokeless tobacco alcohol or illicit drug use.  Medications.  Tylenol 650 mg every 6 hours when necessary.  Norvasc 10 mg daily.  Aspirin 81 mg daily.  Centrum Silver daily.  Keflex 500 mg every 6 hours  Durezol eyedrops  Fish oil 1000 mg capsule daily.  Melatonin daily at bedtime  Coumadin 2.5 mg daily.  Hyzaar 100-25 milligrams daily.  Diastolic 5 mg daily.  Silvadene topically daily daily.  Tramadol 50  mg every 6 hours when necessary pain.   Review of systems.  In general does not complaining any fever or chills. Weight gain as noted above  Skin does not complain of rashes or itching    Eyes does not complain of any visual changes or drainage.  Ear nose mouth and throat does  not complain of sore throat or nasal drainage.  Respiratory is not complaining of shortness breath or cough.  Cardiac does not complaining of chest pain he does not appear to have significant lower extremity edema.  GI no complaint of nausea vomiting diarrhea constipation or abdominal pain.  GU is not complaining of dysuria.  Muscle skeletal is not currently complaining of joint pain or pain of the amputation site.  Neurologic is not complaining of dizziness or headache does have a history of peripheral neuropathy of his extremities.  Psych is not complaining of depression or anxiety.--Has complained of insomnia  Physical exam.  Temperature 97.7 pulse 62 respirations 20 blood pressure 131/58 waitlisted on February 6 177.6-on February 1 173.2-on January 25 170.8  In general this is a pleasant elderly male in no distress sitting comfortably in his chair.  Skin is warm and dry t Continues to be dressing over his left AKA site  Eyes pupils appear reactive sclera and conjunctiva are clear visual acuity appears grossly intact.  Oropharynx clear mucous membranes moist.  Chest is clear to auscultation there is no labored breathing.  Heart is irregular irregular rate and rhythm without murmur gallop or rub he does not appear to have significant lower extremity edema on the right.--Has minimal coccyx edema  His abdomen soft nontender positive bowel sounds.  Muscle skeletal is status post left above-the-knee amputation site is currently dressed otherwise able to move all extremities he is ambulating in a wheelchair I do not note any deformities other than arthritic.  Neurologic is grossly intact to speech is clear no lateralizing findings.  Psych he is alert and oriented pleasant and appropriate appears to be an accurate historian.  Labs.  11/29/2015.  INR 3.09.  11/18/2015.  WBC 11.4 hemoglobin 11.4 platelets 375.  Sodium 139 potassium 4.3 BUN 42 creatinine  0.97.    11/11/2015.  BNP 1070.    11/17/2015.  Sodium 139 potassium 4.3 BUN 42 creatinine 0.97.  WBC 11.7 hemoglobin 11.8 platelets 394.  INR 1.84 this appears to be trending up.  Assessment and plan.  History of left lower extremity cellulitis status post left above-the-knee amputation Appears to be doing very well in this regard se has completed his antibiotic he is followed by wound care-she is doing well with therapy.  He is receiving tramadol for pain.  #2 history of atrial fibrillation this appears rate controlled he is on bi-stolid he is on Coumadin for anticoagulation INR i Was slightly supratherapeutic at 3.09 yesterday Coumadin was decreased to 2.5 mg update PT/INR is pending for tomorrow  #3 history of anemia suspect there is an element of postop blood loss here he is on iron.-WBCn was 11.4 on January 26 will update this  #4-hypertension-his clonidine patch was DC'd in the hospital-he has somewhat variable blood pressure is only 131/58-148/72 will continue to monitor he is on bi-systolic is also on Norvasc 10 mg daily and Hyzaar 100-25 milligrams daily.     #5 as history leukocytosis it appears white count Slightly elevated at 11.4 on lab done January 26 is not complaining of any cough congestion or dysuria-will update this is not febrile   #7  Weight gain as noted above-his appetite has improved-he does not exhibit at this point any concerning increased edema although this will have to be watched he is not complaining of any shortness of breath-this was discussed with Dr. Dellia Nims via phone-will order a BNP tomorrow as well as a metabolic panel-continue to monitor for any changes-again he did have an episode of flash pulmonary edema in the hospital that resolved with Lasix-echo was within normal limits.  Also weigh daily notify provider of any weight gain  \#8-patient was complaining of insomnia they did call me about this yesterday and we have increased his  melatonin he is not complaining of insomnia today but this will have to be monitored.  [id number] note greater than 35 minutes spent assessing patient-discussing his status with nursing staff as well as with Dr. Dellia Nims via phone-reviewing his chart including cardiac history-and coordinating and formulating a plan of care for numerous diagnoses-of note greater than 50% of time spent coordinating plan of care

## 2015-12-01 ENCOUNTER — Encounter (HOSPITAL_COMMUNITY)
Admission: AD | Admit: 2015-12-01 | Discharge: 2015-12-01 | Disposition: A | Discharge status: Home / Self Care | Payer: Medicare Other | Source: Skilled Nursing Facility | Attending: Internal Medicine | Admitting: Internal Medicine

## 2015-12-01 LAB — CBC WITH DIFFERENTIAL/PLATELET
Basophils Absolute: 0.1 10*3/uL (ref 0.0–0.1)
Basophils Relative: 2 %
Eosinophils Absolute: 0.4 10*3/uL (ref 0.0–0.7)
Eosinophils Relative: 5 %
HCT: 35.2 % — ABNORMAL LOW (ref 39.0–52.0)
HEMOGLOBIN: 11.7 g/dL — AB (ref 13.0–17.0)
LYMPHS ABS: 1.9 10*3/uL (ref 0.7–4.0)
LYMPHS PCT: 22 %
MCH: 32.5 pg (ref 26.0–34.0)
MCHC: 33.2 g/dL (ref 30.0–36.0)
MCV: 97.8 fL (ref 78.0–100.0)
Monocytes Absolute: 1 10*3/uL (ref 0.1–1.0)
Monocytes Relative: 11 %
NEUTROS ABS: 5.4 10*3/uL (ref 1.7–7.7)
NEUTROS PCT: 60 %
Platelets: 571 10*3/uL — ABNORMAL HIGH (ref 150–400)
RBC: 3.6 MIL/uL — AB (ref 4.22–5.81)
RDW: 14.6 % (ref 11.5–15.5)
WBC: 8.8 10*3/uL (ref 4.0–10.5)

## 2015-12-01 LAB — BASIC METABOLIC PANEL
Anion gap: 9 (ref 5–15)
BUN: 48 mg/dL — ABNORMAL HIGH (ref 6–20)
CHLORIDE: 107 mmol/L (ref 101–111)
CO2: 22 mmol/L (ref 22–32)
Calcium: 8.5 mg/dL — ABNORMAL LOW (ref 8.9–10.3)
Creatinine, Ser: 0.85 mg/dL (ref 0.61–1.24)
GFR calc non Af Amer: >60 mL/min (ref >60)
Glucose, Bld: 122 mg/dL — ABNORMAL HIGH (ref 65–99)
POTASSIUM: 4.3 mmol/L (ref 3.5–5.1)
SODIUM: 138 mmol/L (ref 135–145)

## 2015-12-01 LAB — PROTIME-INR
INR: 3.29 — ABNORMAL HIGH (ref 0.00–1.49)
Prothrombin Time: 32.8 seconds — ABNORMAL HIGH (ref 11.6–15.2)

## 2015-12-01 LAB — BRAIN NATRIURETIC PEPTIDE: B Natriuretic Peptide: 160 pg/mL — ABNORMAL HIGH (ref 0.0–100.0)

## 2015-12-02 ENCOUNTER — Encounter: Payer: Self-pay | Admitting: Vascular Surgery

## 2015-12-03 ENCOUNTER — Encounter (HOSPITAL_COMMUNITY)
Admission: RE | Admit: 2015-12-03 | Discharge: 2015-12-03 | Disposition: A | Discharge status: Home / Self Care | Payer: Medicare Other | Source: Skilled Nursing Facility | Attending: Internal Medicine | Admitting: Internal Medicine

## 2015-12-03 LAB — PROTIME-INR
INR: 2.48 — ABNORMAL HIGH (ref 0.00–1.49)
Prothrombin Time: 26.5 seconds — ABNORMAL HIGH (ref 11.6–15.2)

## 2015-12-06 ENCOUNTER — Encounter (HOSPITAL_COMMUNITY)
Admission: RE | Admit: 2015-12-06 | Discharge: 2015-12-06 | Disposition: A | Discharge status: Home / Self Care | Payer: Medicare Other | Source: Skilled Nursing Facility | Attending: Internal Medicine | Admitting: Internal Medicine

## 2015-12-06 LAB — PROTIME-INR
INR: 2.4 — ABNORMAL HIGH (ref 0.00–1.49)
Prothrombin Time: 25.9 seconds — ABNORMAL HIGH (ref 11.6–15.2)

## 2015-12-07 ENCOUNTER — Non-Acute Institutional Stay (SKILLED_NURSING_FACILITY): Payer: Medicare Other | Admitting: Internal Medicine

## 2015-12-07 DIAGNOSIS — Z89612 Acquired absence of left leg above knee: Secondary | ICD-10-CM

## 2015-12-07 DIAGNOSIS — I10 Essential (primary) hypertension: Secondary | ICD-10-CM | POA: Diagnosis not present

## 2015-12-07 DIAGNOSIS — I4821 Permanent atrial fibrillation: Secondary | ICD-10-CM

## 2015-12-07 DIAGNOSIS — I482 Chronic atrial fibrillation: Secondary | ICD-10-CM

## 2015-12-07 DIAGNOSIS — D62 Acute posthemorrhagic anemia: Secondary | ICD-10-CM | POA: Diagnosis not present

## 2015-12-07 NOTE — Progress Notes (Signed)
Patient ID: [name], male   DOB: August 11, 1936, 80 y.o.   MRN: [id number]      This is a discharge note  Level care skilled.  Facility is [facility], operational complaint-  Discharge note  History of present illness.  Patient is a pleasant 80 year old male with a complex medical history including coronary artery disease status post CABG and stent placement-chronic atrial fibrillation-and type 2 diabetes.  Patient presented to the hospital with increased erythema of his left lower extremity.  He was also having chills and night sweats.  Eventually noticed second toe was turning black.  He went to his podiatrist who referred him to the emergency department.  He was diagnosed with lower extremity necrotizing cellulitis--this was complicated with a history of severe peripheral vascular disease.  Marland Kitchen  He did receive a left second toe amputation-shortly thereafter he went into flash pulmonary edema was transferred to the ICU.  A TTE showed a normal ejection fraction of 55-60 percent.  The following day did undergo an above-the-knee amputation of the left lower extremity.  His wound culture did grow out MSSA-leukocytosis improved he was discharged on a course of cephalexin he has completed  He appears to be doing quite well in this regards no fever chills doing well with therapy.  Of note he does have a history of atrial fibrillation is on chronic Coumadin as well as bysystolic for rate control--INR most recently was 2.4 this will need updating patient is discharged by home health.     He is also eating quite well nursing staff has noted apparently a weight gain of possibly around 7 pounds over the past 2 weeks-he did have flash pulmonary edema again in the hospital ejection fraction was within normal range-a BNP was done which was 1070 His weight gain has moderated here and an updated BNP was unremarkable at 160 I suspect weight gain is related to his better appetite he does  not complain of any increased edema or shortness of breath even with exertion.   His only complaint essentially here has been insomnia we have started him on melatonin apparently this is helping some.  He will be going home he does have a very supportive wife and family-she will need outpatient therapy as well as a wheelchair for ambulation.  Pain appears to be controlled he really has not used his tramadol much at all   .      Previous medical history.  Left lower extremity necrotizing cellulitis status post left above-the-knee amputation.  Chronic atrial fibrillation.  Hypertension.  History of osteoarthritis.  History of peripheral neuropathy secondary to type 2 diabetes.  History of squamous cell carcinoma left jaw.  Peripheral vascular disease.  Bilateral cataracts.  .  Past surgical history.  History of CABG 1990s.  Squamous cell carcinoma excision 1993.  Tonsillectomy.  ORIF to be a fracture.  Mandibular hardware removal back in 08/02/2015.  Family history.  Significant for heart disease in mother.  Social history.  Patient is married note significant history of tobacco or smokeless tobacco alcohol or illicit drug use.  Medications.  Tylenol 650 mg every 6 hours when necessary.  Norvasc 10 mg daily.  Aspirin 81 mg daily.  Centrum Silver daily.   Durezol eyedrops  Fish oil 1000 mg capsule daily.  Melatonin daily at bedtime  Coumadin 2 mg daily.  Hyzaar 100-25 milligrams daily.  Bystolic 5 mg daily.  Silvadene topically daily daily.  Tramadol 50 mg every 6 hours when necessary pain.  Review of systems.  In general does not complaining any fever or chills. Weight gain as noted above appears to have moderated in the mid 170s  Skin does not complain of rashes or itching    Eyes does not complain of any visual changes or drainage.  Ear nose mouth and throat does not complain of sore throat or nasal drainage.  Respiratory  is not complaining of shortness breath or cough.  Cardiac does not complaining of chest pain he does not appear to have significant lower extremity edema.  GI no complaint of nausea vomiting diarrhea constipation or abdominal pain.  GU is not complaining of dysuria.  Muscle skeletal is not currently complaining of joint pain or pain of the amputation site.  Neurologic is not complaining of dizziness or headache does have a history of peripheral neuropathy of his extremities.  Psych is not complaining of depression or anxiety.--Has complained of insomnia  Physical exam.   Temperature 97.4 pulse 66 respirations 24 blood pressure 138/64 weight is 176.4  In general this is a pleasant elderly male in no distress sitting comfortably in his chair.  Skin is warm and dry t He does have under his right great toe a small round brownish raised area at this point this does not appear to be infected-we will discuss this with wound care however  Eyes pupils appear reactive sclera and conjunctiva are clear visual acuity appears grossly intact.  Oropharynx clear mucous membranes moist.  Chest is clear to auscultation there is no labored breathing.  Heart is  regularrate and rhythm without murmur gallop or rub he does not appear to have significant lower extremity edema on the right.--  His abdomen soft nontender positive bowel sounds.  Muscle skeletal is status post left above-the-knee amputation -- otherwise able to move all extremities he is ambulating in a wheelchair I do not note any deformities other than arthritic.  Neurologic is grossly intact to speech is clear no lateralizing findings.  Psych he is alert and oriented pleasant and appropriate appears to be an accurate historian.   Labs  12/06/2015  INR 2.40.   12/01/2015.  Sodium 138 potassium 4.3 BUN 48 creatinine 0.5.  WBC 8.8 hemoglobin 11.7 platelets 571.  BNP 160 .    11/29/2015.  INR  3.09.  11/18/2015.  WBC 11.4 hemoglobin 11.4 platelets 375.  Sodium 139 potassium 4.3 BUN 42 creatinine 0.97.    11/11/2015.  BNP 1070.    11/17/2015.  Sodium 139 potassium 4.3 BUN 42 creatinine 0.97.  WBC 11.7 hemoglobin 11.8 platelets 394.  INR 1.84 this appears to be trending up.  Assessment and plan.  History of left lower extremity cellulitis status post left above-the-knee amputation Appears to be doing very well in this regard se has completed his antibiotic he is followed by wound care-has done very well with therapy will need continued outpatient therapy pain appears to be controlled does not really take tramadol much at all  He is slated to see vascular surgeon tomorrow    #2 history of atrial fibrillation this appears rate controlled he is on bystoliche is on Coumadin for anticoagulation INR  Therapeutic-this will need be updated after discharge by home health with primary care provider notified of results  #3 history of anemia suspect there is an element of postop blood loss here he is on iron.- Globin appears to be slowly rising at 11.7 on lab done on February 8 will update this before discharge notify primary care provider of results  #  4-hypertension-his clonidine patch was DC'd in the hospital-  he is on bi-systolic is also on Norvasc 10 mg daily and Hyzaar 100-25 milligrams daily.--This appears to be stable recent blood pressures 138/64-127/76-     #5 as history leukocytosis Appears to have resolved recent white count 8.8 on February 8   #7  Weight gain as noted above-his appetite has improved-he does not exhibit at this point any concerning increased edema although this will have to be watched  Updated BMP was unremarkable at 160.    #8 history of insomnia he is now on melatonin.  #9 history diabetes type 2 this is diet-controlled-CBGs have been unremarkable-largely in the 80s to low 100s in the morning more in the mid 100 range at 5  PM   [id number]-of note greater than 30 minutes spent on this discharge summary-greater than 50% of time spent coordinating plan of care for numerous diagnoses

## 2015-12-08 ENCOUNTER — Encounter: Payer: Self-pay | Admitting: Vascular Surgery

## 2015-12-08 ENCOUNTER — Ambulatory Visit (INDEPENDENT_AMBULATORY_CARE_PROVIDER_SITE_OTHER): Payer: Medicare Other | Admitting: Vascular Surgery

## 2015-12-08 ENCOUNTER — Encounter (HOSPITAL_COMMUNITY)
Admission: AD | Admit: 2015-12-08 | Discharge: 2015-12-08 | Disposition: A | Discharge status: Home / Self Care | Payer: Medicare Other | Source: Skilled Nursing Facility | Attending: Internal Medicine | Admitting: Internal Medicine

## 2015-12-08 VITALS — BP 135/57 | HR 72 | Temp 98.1°F | Resp 16 | Ht 68.5 in | Wt 174.0 lb

## 2015-12-08 DIAGNOSIS — Z48812 Encounter for surgical aftercare following surgery on the circulatory system: Secondary | ICD-10-CM

## 2015-12-08 LAB — BASIC METABOLIC PANEL
ANION GAP: 6 (ref 5–15)
BUN: 30 mg/dL — ABNORMAL HIGH (ref 6–20)
CHLORIDE: 107 mmol/L (ref 101–111)
CO2: 26 mmol/L (ref 22–32)
Calcium: 8.5 mg/dL — ABNORMAL LOW (ref 8.9–10.3)
Creatinine, Ser: 0.83 mg/dL (ref 0.61–1.24)
Glucose, Bld: 108 mg/dL — ABNORMAL HIGH (ref 65–99)
Potassium: 3.9 mmol/L (ref 3.5–5.1)
Sodium: 139 mmol/L (ref 135–145)

## 2015-12-08 LAB — CBC
HEMATOCRIT: 33.3 % — AB (ref 39.0–52.0)
HEMOGLOBIN: 11 g/dL — AB (ref 13.0–17.0)
MCH: 31.9 pg (ref 26.0–34.0)
MCHC: 33 g/dL (ref 30.0–36.0)
MCV: 96.5 fL (ref 78.0–100.0)
Platelets: 344 10*3/uL (ref 150–400)
RBC: 3.45 MIL/uL — AB (ref 4.22–5.81)
RDW: 14.4 % (ref 11.5–15.5)
WBC: 6.8 10*3/uL (ref 4.0–10.5)

## 2015-12-08 NOTE — Progress Notes (Signed)
   Patient name: [name] MRN: [id number] DOB: 1936-04-12 Sex: male  REASON FOR VISIT: Follow up after left above-the-knee amputation.  HPI: [name] is a 80 y.o. male who was admitted with a diabetic foot infection that rapidly progressed despite intravenous antibiotics. The cellulitis extended up to the thigh and an emergent left above-the-knee amputation was performed for this rapidly progressive infection. This was on 11/11/2015. He comes in for a one-month follow up visit. He has been doing very well in rehabilitation and has been working very hard.  He has no specific complaints.   REVIEW OF SYSTEMS:  [X]  denotes positive finding, [ ]  denotes negative finding Cardiac  Comments:  Chest pain or chest pressure:    Shortness of breath upon exertion:    Short of breath when lying flat:    Irregular heart rhythm:    Constitutional    Fever or chills:      PHYSICAL EXAM: Filed Vitals:   12/08/15 1304  BP: 135/57  Pulse: 72  Temp: 98.1 F (36.7 C)  TempSrc: Oral  Resp: 16  Height: 5' 8.5" (1.74 m)  Weight: 174 lb (78.926 kg)  SpO2: 99%    GENERAL: The patient is a well-nourished male, in no acute distress. The vital signs are documented above. CARDIOVASCULAR: There is a regular rate and rhythm. PULMONARY: There is good air exchange bilaterally without wheezing or rales. His left above-the-knee amputation is healing nicely. He has a very superficial skin abrasion over his right lateral malleolus and over his pretibial area. He also has hyperpigmentation consistent with chronic venous insufficiency. In addition he has a callus on the plantar aspect of his right foot.  I did review his previous arterial Doppler study from 10/06/15 which showed a triphasic posterior tibial signal on the right with a biphasic dorsalis pedis signal. An ABI could not be obtained as the arteries were calcified and not compressible.  MEDICAL ISSUES: The patient is doing well status post  left above-the-knee amputation and I have written him a prescription for a prosthesis and a stump shrinker. He removed his staples in the office today. I have ordered an ABI in the right leg in 6 months and will see him back at that time. He knows to call sooner if he has problems.  Deitra Mayo [facility]: [phone]

## 2015-12-09 ENCOUNTER — Encounter (HOSPITAL_COMMUNITY)
Admission: AD | Admit: 2015-12-09 | Discharge: 2015-12-09 | Disposition: A | Discharge status: Home / Self Care | Payer: Medicare Other | Source: Skilled Nursing Facility | Attending: Internal Medicine | Admitting: Internal Medicine

## 2015-12-09 ENCOUNTER — Encounter: Payer: Self-pay | Admitting: Adult Health

## 2015-12-09 ENCOUNTER — Ambulatory Visit (INDEPENDENT_AMBULATORY_CARE_PROVIDER_SITE_OTHER): Payer: Medicare Other | Admitting: Adult Health

## 2015-12-09 ENCOUNTER — Ambulatory Visit (INDEPENDENT_AMBULATORY_CARE_PROVIDER_SITE_OTHER): Payer: Medicare Other | Admitting: *Deleted

## 2015-12-09 VITALS — BP 118/68 | HR 58 | Ht 68.0 in | Wt 173.0 lb

## 2015-12-09 DIAGNOSIS — I4821 Permanent atrial fibrillation: Secondary | ICD-10-CM

## 2015-12-09 DIAGNOSIS — Z5181 Encounter for therapeutic drug level monitoring: Secondary | ICD-10-CM | POA: Diagnosis not present

## 2015-12-09 DIAGNOSIS — I739 Peripheral vascular disease, unspecified: Secondary | ICD-10-CM

## 2015-12-09 DIAGNOSIS — I2510 Atherosclerotic heart disease of native coronary artery without angina pectoris: Secondary | ICD-10-CM

## 2015-12-09 DIAGNOSIS — I482 Chronic atrial fibrillation: Secondary | ICD-10-CM

## 2015-12-09 DIAGNOSIS — I480 Paroxysmal atrial fibrillation: Secondary | ICD-10-CM | POA: Diagnosis not present

## 2015-12-09 DIAGNOSIS — I10 Essential (primary) hypertension: Secondary | ICD-10-CM | POA: Diagnosis not present

## 2015-12-09 LAB — POCT INR: INR: 2.5

## 2015-12-09 NOTE — Progress Notes (Signed)
Cardiology Office Note   Date:  12/09/2015   ID:  MONTEE TALLMAN, DOB [birth year], MRN [id number]  PCP:  Robert Bellow, MD  Cardiologist: Woodroe Chen, NP   Chief Complaint  Patient presents with  . Coronary Artery Disease  . Atrial Fibrillation    PAF      History of Present Illness: [name] is a 80 y.o. male who presents for ongoing assessment and management of CAD, with coronary artery bypass grafting in the 1990s, paroxysmal atrial fibrillation, on Coumadin therapy, CHADS VASC Score 4.the patient was recently discharged from the hospital after admission for cellulitis of the left lower extremity and diffuse erythema with ischemic changes of the left third toe.    Exam showed a rapidly progressive ischemia in the left lower extremity and he was taken back to the operating room and underwent left above-the-knee amputation.  He was restarted on anticoagulation.  He was sent to physical rehabilitation and is currently undergoing this at the [facility].  He comes today in good spirits.  He was able to go home yesterday.  There is around being built in his home and some access for his bathroom to allow him to bring his wheelchair in.  He is being fitted for a leg prosthesis on the left.  He is also going to outpatient rehabilitation at this time.  He is without complaints of chest pain, bleeding, or dyspnea.  He does have some shoulder pain, which is being related to his use of walker and getting in and out of wheelchair.  Past Medical History  Diagnosis Date  . Arteriosclerotic cardiovascular disease (ASCVD)     CABG in 1990s; 2002 total obstruction of LAD, CX and RCA with patent grafts and nl EF; Stress nuc. 2008 - mild LV dilation; normal EF; questionable small anteroapical scar; no ischemia  . Atrial fibrillation ([place])   . Chronic anticoagulation   . Arthritis     "left knee" (11/08/2015)  . Pedal edema     "not a lot", per pt.  . Diabetic peripheral  neuropathy ([facility])     bilateral lower leg, hands  . Hypertension     states under control with meds., has been on med. x "long time"  . Peripheral vascular disease ([place])   . 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 Dental crowns present   . Runny nose 07/27/2015    clear drainage, per pt.  . Myocardial infarction ([place]) 1990s    "after my jaw OR"  . Type II diabetes mellitus ([place])     "diet controlled" (11/08/2015)  . Iron deficiency anemia   . History of blood transfusion     "related to my leg surgery?"  . Cellulitis 11/08/2015    "both feet"  . Jaw cancer ([place]) 1990s    "squamous cell"    Past Surgical History  Procedure Laterality Date  . Squamous cell carcinoma excision Left 1993    "took my jaw out; got cadavar in there now"  . Tonsillectomy    . Orif tibia fracture Left ~ 1970  . Colonoscopy N/A 11/27/2014    Procedure: COLONOSCOPY;  Surgeon: Rogene Houston, MD;  Location: [facility];  Service: Endoscopy;  Laterality: N/A;  830  . Incision and drainage abscess Left 06/16/2005    wide exc. abscess 5th toe  . Mandibular hardware removal Left 08/02/2015    Procedure:  HARDWARE REMOVAL TWO MANDIBULAR SCREWS;  Surgeon: Jeral Fruit  Sabra Heck, DDS;  Location: [facility];  Service: Oral Surgery;  Laterality: Left;  . Fracture surgery    . Cataract extraction w/ intraocular lens  implant, bilateral Bilateral 09/2015  . Cardiac catheterization  1990s; 04/16/2001  . Coronary artery bypass graft  1990's    "CABG X4"  . I&d extremity Left 11/10/2015    Procedure: INCISION AND DRAINAGE LEFT FOOT, AMPUTATION OF LEFT THIRD TOE;  Surgeon: Conrad Graton, MD;  Location: [facility];  Service: Vascular;  Laterality: Left;  . Amputation Left 11/11/2015    Procedure: LEFT ABOVE KNEE AMPUTATION;  Surgeon: Angelia Mould, MD;  Location: [facility] OR;  Service: Vascular;  Laterality: Left;     Current Outpatient Prescriptions  Medication Sig Dispense Refill  .  acetaminophen (TYLENOL) 325 MG tablet Take 2 tablets (650 mg total) by mouth every 6 (six) hours as needed for mild pain or moderate pain. 30 tablet 3  . amLODipine (NORVASC) 10 MG tablet Take 1 tablet (10 mg total) by mouth daily. 90 tablet 3  . aspirin 81 MG tablet Take 81 mg by mouth daily.    . Difluprednate (DUREZOL) 0.05 % EMUL Apply 1 drop to eye 2 (two) times daily.    . ferrous sulfate 325 (65 FE) MG tablet Take 65 mg by mouth daily with breakfast.    . fish oil-omega-3 fatty acids 1000 MG capsule Take 1 capsule by mouth daily.      Marland Kitchen JANTOVEN 2.5 MG tablet TAKE 1 TABLET DAILY OR AS  DIRECTED BY COUMADIN CLINIC 90 tablet 3  . losartan-hydrochlorothiazide (HYZAAR) 100-25 MG per tablet Take 1 tablet by mouth daily.      . Multiple Vitamins-Minerals (CENTRUM SILVER PO) Take 1 tablet by mouth daily.      . nebivolol (BYSTOLIC) 5 MG tablet Take 1 tablet (5 mg total) by mouth daily. 90 tablet 3  . Probiotic Product (PHILLIPS COLON HEALTH PO) Take 1 capsule by mouth daily.     . silver sulfADIAZINE (SILVADENE) 1 % cream Apply topically daily. 50 g 0  . Amino Acids-Protein Hydrolys (FEEDING SUPPLEMENT, PRO-STAT SUGAR FREE 64,) LIQD Take 30 mLs by mouth 2 (two) times daily. (Patient not taking: Reported on 12/09/2015) 900 mL 0  . feeding supplement (BOOST / RESOURCE BREEZE) LIQD Take 1 Container by mouth 3 (three) times daily between meals. (Patient not taking: Reported on 12/09/2015) 90 Container 4   No current facility-administered medications for this visit.    Allergies:   Lipitor; Simvastatin; Penicillins; and Sulfa antibiotics    Social History:  The patient  reports that he has never smoked. He has never used smokeless tobacco. He reports that he does not drink alcohol or use illicit drugs.   Family History:  The patient's family history includes Heart disease in his mother.    ROS: All other systems are reviewed and negative. Unless otherwise mentioned in H&P    PHYSICAL EXAM: VS:   BP 118/68 mmHg  Pulse 58  Ht 5' 8"  (1.727 m)  Wt 173 lb (78.472 kg)  BMI 26.31 kg/m2  SpO2 98% , BMI Body mass index is 26.31 kg/(m^2). GEN: Well nourished, well developed, in no acute distress HEENT: normal Neck: no JVD, carotid bruits, or masses Cardiac: RRR; no murmurs, rubs, or gallops,no edema  Respiratory:  clear to auscultation bilaterally, normal work of breathing GI: soft, nontender, nondistended, + BS MS: no deformity or atrophy Skin: warm and dry, no rash Neuro:  Strength and sensation are  intact Psych: euthymic mood, full affect  Recent Labs: 11/11/2015: ALT 39; TSH 4.746* 11/15/2015: Magnesium 1.9 12/01/2015: B Natriuretic Peptide 160.0* 12/08/2015: BUN 30*; Creatinine, Ser 0.83; Hemoglobin 11.0*; Platelets 344; Potassium 3.9; Sodium 139    Lipid Panel    Component Value Date/Time   CHOL 112 10/04/2009   CHOL 293 10/04/2009   TRIG 113 10/04/2009   TRIG 113 10/04/2009   HDL 28 10/04/2009   HDL 28 10/04/2009   LDLCALC 61 10/04/2009   LDLCALC 61 10/04/2009      Wt Readings from Last 3 Encounters:  12/09/15 173 lb (78.472 kg)  12/08/15 174 lb (78.926 kg)  11/13/15 174 lb 9.7 oz (79.2 kg)     ASSESSMENT AND PLAN:  1. CAD: Status post coronary bypass grafting, without angina.  He is doing very well and is without any complaints of chest pain or dyspnea.  2. Peripheral arterial disease:he is status post below-the-knee amputation on the left secondary to gangrene and sepsis.  He is currently going to outpatient physical rehabilitation, has been fitted for a leg prosthesis.  He is working very hard to regain his mobility.  3. Paroxysmal atrial fibrillation: He continues on Coumadin therapy, and have PT/INR checked today.  4. Hypertension: blood pressure is low normal.  We may have to titrate down.  Blood pressure medications to avoid dizziness.  Will see him again in 6 weeks to evaluate blood pressure as he will be up and moving more with a prosthetic leg.  I am  concerned about dizziness due to hypotension when he is standing.  Would decrease his amlodipine to 5 mg daily, should this occur.  He is to call us for any dizziness or near syncope.  Otherwise, we will see him in 6 weeks  Current medicines are reviewed at length with the patient today.    Labs/ tests ordered today include:  No orders of the defined types were placed in this encounter.     Disposition:   FU with 6 weeks.   Signed, Jory Sims, NP  12/09/2015 1:48 PM    [facility] [address], [place],  [address] Phone: [phone]; Fax: [phone]

## 2015-12-09 NOTE — Patient Instructions (Signed)
Your physician recommends that you schedule a follow-up appointment in: 6 Weeks with Jory Sims, NP.  Your physician recommends that you continue on your current medications as directed. Please refer to the Current Medication list given to you today.  Please call the office if your Blood Pressure continues to decrease.   If you need a refill on your cardiac medications before your next appointment, please call your pharmacy.  Thank you for choosing [facility]!

## 2015-12-09 NOTE — Progress Notes (Signed)
Name: PHILEMON RIEDESEL    DOB: 20-Jan-1936  Age: 80 y.o.  MR#: [id number]       PCP:  Robert Bellow, MD      Insurance: Payor: MEDICARE / Plan: MEDICARE PART A AND B / Product Type: *No Product type* /   CC:   No chief complaint on file.   VS Filed Vitals:   12/09/15 1307  BP: 118/68  Pulse: 58  Height: 5' 8"  (1.727 m)  Weight: 173 lb (78.472 kg)  SpO2: 98%    Weights Current Weight  12/09/15 173 lb (78.472 kg)  12/08/15 174 lb (78.926 kg)  11/13/15 174 lb 9.7 oz (79.2 kg)    Blood Pressure  BP Readings from Last 3 Encounters:  12/09/15 118/68  12/08/15 135/57  11/30/15 131/58     Admit date:  (Not on file) Last encounter with RMR:  Visit date not found   Allergy Lipitor; Simvastatin; Penicillins; and Sulfa antibiotics  Current Outpatient Prescriptions  Medication Sig Dispense Refill  . acetaminophen (TYLENOL) 325 MG tablet Take 2 tablets (650 mg total) by mouth every 6 (six) hours as needed for mild pain or moderate pain. 30 tablet 3  . amLODipine (NORVASC) 10 MG tablet Take 1 tablet (10 mg total) by mouth daily. 90 tablet 3  . aspirin 81 MG tablet Take 81 mg by mouth daily.    . Difluprednate (DUREZOL) 0.05 % EMUL Apply 1 drop to eye 2 (two) times daily.    . ferrous sulfate 325 (65 FE) MG tablet Take 65 mg by mouth daily with breakfast.    . fish oil-omega-3 fatty acids 1000 MG capsule Take 1 capsule by mouth daily.      Marland Kitchen JANTOVEN 2.5 MG tablet TAKE 1 TABLET DAILY OR AS  DIRECTED BY COUMADIN CLINIC 90 tablet 3  . losartan-hydrochlorothiazide (HYZAAR) 100-25 MG per tablet Take 1 tablet by mouth daily.      . Multiple Vitamins-Minerals (CENTRUM SILVER PO) Take 1 tablet by mouth daily.      . nebivolol (BYSTOLIC) 5 MG tablet Take 1 tablet (5 mg total) by mouth daily. 90 tablet 3  . Probiotic Product (PHILLIPS COLON HEALTH PO) Take 1 capsule by mouth daily.     . silver sulfADIAZINE (SILVADENE) 1 % cream Apply topically daily. 50 g 0  . Amino Acids-Protein Hydrolys  (FEEDING SUPPLEMENT, PRO-STAT SUGAR FREE 64,) LIQD Take 30 mLs by mouth 2 (two) times daily. (Patient not taking: Reported on 12/09/2015) 900 mL 0  . feeding supplement (BOOST / RESOURCE BREEZE) LIQD Take 1 Container by mouth 3 (three) times daily between meals. (Patient not taking: Reported on 12/09/2015) 90 Container 4   No current facility-administered medications for this visit.    Discontinued Meds:    Medications Discontinued During This Encounter  Medication Reason  . traMADol (ULTRAM) 50 MG tablet Error    Patient Active Problem List   Diagnosis Date Noted  . Weight gain 11/30/2015  . Coronary artery disease involving coronary bypass graft of native heart without angina pectoris   . Tachycardia   . Leukocytosis   . Abnormality of gait   . Status post above knee amputation of left lower extremity ([place])   . Acute blood loss anemia   . Acute pulmonary edema (HCC)   . Acute congestive heart failure ([place])   . Acute respiratory failure with hypoxia ([place])   . Pressure ulcer 11/09/2015  . Necrotic toes ([place]) 11/08/2015  . Lower extremity cellulitis 11/08/2015  .  Bradycardia 05/28/2015  . PVD (peripheral vascular disease) with claudication ([place]) 09/30/2014  . Encounter for therapeutic drug monitoring 11/26/2013  . Peripheral vascular disease ([place]) 08/06/2012  . Chronic anticoagulation 01/20/2011  . Diabetes mellitus ([place]) 03/04/2010  . Dyslipidemia 03/04/2010  . Essential hypertension 03/04/2010  . Hx of CABG 03/04/2010  . Permanent atrial fibrillation (HCC) 04/08/2009    LABS    Component Value Date/Time   NA 139 12/08/2015 0730   NA 138 12/01/2015 0700   NA 139 11/17/2015 0720   K 3.9 12/08/2015 0730   K 4.3 12/01/2015 0700   K 4.3 11/17/2015 0720   CL 107 12/08/2015 0730   CL 107 12/01/2015 0700   CL 107 11/17/2015 0720   CO2 26 12/08/2015 0730   CO2 22 12/01/2015 0700   CO2 23 11/17/2015 0720   GLUCOSE 108* 12/08/2015 0730   GLUCOSE 122* 12/01/2015 0700    GLUCOSE 107* 11/17/2015 0720   BUN 30* 12/08/2015 0730   BUN 48* 12/01/2015 0700   BUN 42* 11/17/2015 0720   CREATININE 0.83 12/08/2015 0730   CREATININE 0.85 12/01/2015 0700   CREATININE 0.97 11/17/2015 0720   CREATININE 0.84 06/09/2013 1228   CREATININE 0.90 05/06/2013 1455   CALCIUM 8.5* 12/08/2015 0730   CALCIUM 8.5* 12/01/2015 0700   CALCIUM 7.7* 11/17/2015 0720   GFRNONAA >60 12/08/2015 0730   GFRNONAA >60 12/01/2015 0700   GFRNONAA >60 11/17/2015 0720   GFRAA >60 12/08/2015 0730   GFRAA >60 12/01/2015 0700   GFRAA >60 11/17/2015 0720   CMP     Component Value Date/Time   NA 139 12/08/2015 0730   K 3.9 12/08/2015 0730   CL 107 12/08/2015 0730   CO2 26 12/08/2015 0730   GLUCOSE 108* 12/08/2015 0730   BUN 30* 12/08/2015 0730   CREATININE 0.83 12/08/2015 0730   CREATININE 0.84 06/09/2013 1228   CALCIUM 8.5* 12/08/2015 0730   PROT 6.9 11/11/2015 0930   ALBUMIN 2.6* 11/11/2015 0930   AST 37 11/11/2015 0930   ALT 39 11/11/2015 0930   ALKPHOS 131* 11/11/2015 0930   BILITOT 2.1* 11/11/2015 0930   GFRNONAA >60 12/08/2015 0730   GFRAA >60 12/08/2015 0730       Component Value Date/Time   WBC 6.8 12/08/2015 0730   WBC 8.8 12/01/2015 0700   WBC 11.4* 11/18/2015 0645   HGB 11.0* 12/08/2015 0730   HGB 11.7* 12/01/2015 0700   HGB 11.4* 11/18/2015 0645   HCT 33.3* 12/08/2015 0730   HCT 35.2* 12/01/2015 0700   HCT 34.7* 11/18/2015 0645   MCV 96.5 12/08/2015 0730   MCV 97.8 12/01/2015 0700   MCV 96.4 11/18/2015 0645    Lipid Panel     Component Value Date/Time   CHOL 112 10/04/2009   CHOL 293 10/04/2009   TRIG 113 10/04/2009   TRIG 113 10/04/2009   HDL 28 10/04/2009   HDL 28 10/04/2009   LDLCALC 61 10/04/2009   LDLCALC 61 10/04/2009    ABG    Component Value Date/Time   PHART 7.464* 11/13/2015 0917   PCO2ART 34.9* 11/13/2015 0917   PO2ART 57.0* 11/13/2015 0917   HCO3 25.1* 11/13/2015 0917   TCO2 26 11/13/2015 0917   ACIDBASEDEF 4.0* 11/11/2015 0225    O2SAT 91.0 11/13/2015 0917     Lab Results  Component Value Date   TSH 4.746* 11/11/2015   BNP (last 3 results)  Recent Labs  11/11/15 0040 11/11/15 0930 12/01/15 0700  BNP 1070.3* 1346.1* 160.0*  ProBNP (last 3 results) No results for input(s): PROBNP in the last 8760 hours.  Cardiac Panel (last 3 results) No results for input(s): CKTOTAL, CKMB, TROPONINI, RELINDX in the last 72 hours.  Iron/TIBC/Ferritin/ %Sat No results found for: IRON, TIBC, FERRITIN, IRONPCTSAT   EKG Orders placed or performed during the hospital encounter of 11/08/15  . EKG 12-Lead  . EKG 12-Lead  . EKG 12-Lead  . EKG 12-Lead     Prior Assessment and Plan Problem List as of 12/09/2015      Cardiovascular and Mediastinum   Essential hypertension   Last Assessment & Plan 05/06/2013 Office Visit Edited 05/21/2013  1:27 PM by Yehuda Savannah, MD    Blood pressure control has been suboptimal. Spironolactone added to medical regime with close monitoring of electrolytes and renal function.   Recommendation to the patient that he obtain blood pressures at home and pay closer attention to adjustment of medications to adequately control hypertension.  No recent laboratory test results available. We will seek records from Dr. Karie Kirks.      Permanent atrial fibrillation [facility])   Last Assessment & Plan 05/28/2015 Office Visit Written 05/28/2015  4:06 PM by Erlene Quan, PA-C    .      Peripheral vascular disease [facility])   Last Assessment & Plan 01/06/2013 Office Visit Edited 01/08/2013  9:57 AM by Yehuda Savannah, MD    Patient has atherosclerotic disease but no definite symptoms or problems related to that. Consultation with a vascular expert will be deferred. He has new edema, primarily in the right leg. Amlodipine dosage will be reduced markedly and venous ultrasound performed to exclude DVT. Salt restriction and leg elevation will also be emphasized. I will reassess his edema in 3 weeks.      PVD  (peripheral vascular disease) with claudication [facility])   Last Assessment & Plan 09/30/2014 Office Visit Written 09/30/2014  2:45 PM by Angelia Mould, MD    Based on his exam, he has evidence of infrainguinal arterial occlusive disease on the left side. On the right side however, he has palpable pedal pulses and a normal ABI. Given that his symptoms involving both lower extremities I'm not convinced that his symptoms are related to his peripheral vascular disease. He denies any back pain but his symptoms could be neurogenic in origin. He does have significant neuropathy. Given his infrainguinal arterial occlusive disease on the left, I would only consider arteriography and revascularization if he developed a nonhealing wound or significant rest pain. Fortunately, he is not a smoker. I have encouraged him to stay as active as possible. I'll plan on seeing him back in one year with follow up ABIs. He knows to call sooner if he has problems.      Acute congestive heart failure (HCC)   Coronary artery disease involving coronary bypass graft of native heart without angina pectoris     Respiratory   Acute pulmonary edema (HCC)   Acute respiratory failure with hypoxia ([place])     Endocrine   Diabetes mellitus [facility])   Last Assessment & Plan 05/28/2015 Office Visit Written 05/28/2015  4:08 PM by Erlene Quan, PA-C    +Peripheral neuropathy        Musculoskeletal and Integument   Pressure ulcer     Other   Dyslipidemia   Last Assessment & Plan 05/28/2015 Office Visit Written 05/28/2015  4:09 PM by Erlene Quan, PA-C    Statin intolerant      Hx of  CABG   Last Assessment & Plan 05/28/2015 Office Visit Written 05/28/2015  4:08 PM by Erlene Quan, PA-C    CABG in the 1990s; 2002 patent grafts and nl EF; Stress nuc. 2008-mild LV dilatation; normal EF,  no ischemia      Chronic anticoagulation   Last Assessment & Plan 05/28/2015 Office Visit Written 05/28/2015  4:06 PM by Erlene Quan, PA-C    Warfarin       Encounter for therapeutic drug monitoring   Bradycardia   Necrotic toes ([place])   Lower extremity cellulitis   Tachycardia   Leukocytosis   Abnormality of gait   Status post above knee amputation of left lower extremity ([place])   Acute blood loss anemia   Weight gain       Imaging: Mr Foot Left W Wo Contrast  11/10/2015  CLINICAL DATA:  Left lower extremity cellulitis in a diabetic patient. Low grade fever. Question ischemia of the left third toe. Initial encounter. EXAM: MRI OF THE LEFT FOREFOOT WITHOUT AND WITH CONTRAST TECHNIQUE: Multiplanar, multisequence MR imaging was performed both before and after administration of intravenous contrast. CONTRAST:  18 mL MULTIHANCE GADOBENATE DIMEGLUMINE 529 MG/ML IV SOLN COMPARISON:  Plain films left foot 11/08/2015. FINDINGS: Diffuse subcutaneous edema is present about the foot. There is also diffuse soft tissue enhancement with the exception of the plantar soft tissues at the level of the second, third and fourth MTP joints and soft tissues about the third toe. Tissues in the distal most aspect of the second toe also do not enhance. A fluid collection in the subcutaneous plantar fat deep to the third metatarsal is centered approximately 2.8 cm proximal to the third MTP joint. The collection measures 1.7 cm transverse by 0.6 cm craniocaudal by 3.3 cm long and is worrisome for abscess. There is no bone marrow signal abnormality or enhancement to suggest osteomyelitis. Mild increased signal can the bones of the third toe is similar to that seen in either osseous structures and most compatible with normal enhancement. No Marrow edema in the periphery of the lateral sesamoid is likely related to degenerative disease. IMPRESSION: Findings consistent with diffuse cellulitis of the left foot. Lack of enhancement of subcutaneous fat deep to the second, third and fourth MTP joints, about the third toe and distal most second toe is worrisome for absence of blood  flow/ischemia. No finding to suggest osteonecrosis is seen. Rim enhancing fluid collection in subcutaneous fat deep to the third metatarsal is compatible with an abscess. Negative for osteomyelitis. Electronically Signed   By: Inge Rise M.D.   On: 11/10/2015 12:41   Mr Jodene Nam Ext Low Left Wo/w Cm  11/10/2015  CLINICAL DATA:  Right toe gangrene EXAM: MR MRA OF THE LEFT LOWER EXTREMITY WITHOUT AND WITH CONTRAST TECHNIQUE: Angiographic images of the chest were obtained using MRA technique without and with intravenous contrast. CONTRAST:  18 cc MultiHance COMPARISON:  None. FINDINGS: Imaging has been achieved from the knee joint to the ankles. Right leg: The distal right popliteal artery is patent. There is significant narrowing at the origin of the anterior tibial artery. It occludes 3 cm beyond its takeoff. There is mild disease involving the tibioperoneal trunk and origin of the peroneal artery. There is an area of moderate focal narrowing in the peroneal artery, 4 cm beyond its takeoff. It is patent to the ankle and reconstitutes the dorsalis pedis artery. There is late filling of the remainder of the anterior tibial artery. The posterior tibial artery  is patent to the ankle. Left leg: The popliteal artery is patent below-the-knee. The proximal anterior tibial artery and tibioperoneal trunk are both occluded. The distal tibioperoneal trunk reconstitutes. The posterior tibial artery is patent to the ankle with a focal area of significant narrowing just above the ankle. The peroneal artery is severely diseased. The anterior tibial artery somewhat reconstitutes distally. IMPRESSION: In the right lower extremity, the distal right popliteal artery is patent and there is 2 vessel runoff via the peroneal and posterior tibial arteries. There is some disease in the tibioperoneal trunk and peroneal artery. In the left lower extremity, the popliteal artery is patent below-the-knee, however there is no continuous vessel  runoff below the knee. The anterior tibial artery is occluded. The tibioperoneal trunk is occluded but reconstitutes distally. The posterior tibial artery is patent beyond its origin. Electronically Signed   By: Marybelle Killings M.D.   On: 11/10/2015 13:13   Dg Chest Port 1 View  11/15/2015  CLINICAL DATA:  Acute respiratory failure, shortness of Breath EXAM: PORTABLE CHEST 1 VIEW COMPARISON:  11/14/2015 FINDINGS: Prior CABG. Right subclavian central line tip in the SVC, unchanged. Cardiomegaly. Right lower lobe airspace opacity. No confluent opacity on the left. No effusions. No acute bony abnormality. IMPRESSION: Increasing right infrahilar/lower lobe airspace opacity. Cannot exclude pneumonia. Stable cardiomegaly. Electronically Signed   By: Rolm Baptise M.D.   On: 11/15/2015 07:20   Dg Chest Port 1 View  11/14/2015  CLINICAL DATA:  Acute respiratory failure EXAM: PORTABLE CHEST 1 VIEW COMPARISON:  11/13/2015 FINDINGS: Cardiomegaly again noted. Right central line with tip in SVC again noted. Status post CABG. Slight improvement in aeration. Residual mild interstitial prominence in right upper lobe perihilar and right infrahilar region probable residual mild interstitial edema. No segmental infiltrates. IMPRESSION: Right central line with tip in SVC again noted. Status post CABG. Slight improvement in aeration. Residual mild interstitial prominence in right upper lobe perihilar and right infrahilar region probable residual mild interstitial edema. Electronically Signed   By: Lahoma Crocker M.D.   On: 11/14/2015 09:19   Dg Chest Port 1 View  11/13/2015  CLINICAL DATA:  Below the knee amputation on January 19th. Acute respiratory distress requiring intubation on January 18th. Now extubated. EXAM: PORTABLE CHEST 1 VIEW COMPARISON:  Chest x-rays dated 11/12/2015 and 11/11/2015. FINDINGS: Increased opacities throughout the right lung and at the left lung base, most likely edema. Cardiomegaly is stable. Patient is  status post median sternotomy for presumed CABG. Right-sided line is stable in position with tip overlying the mid SVC. Endotracheal tube and enteric tube have been removed in the interval. IMPRESSION: 1. Cardiomegaly is stable. 2. Diffuse airspace opacities throughout the right lung and at the left lung base, most suggestive of pulmonary edema related to volume overload/CHF. Electronically Signed   By: Franki Cabot M.D.   On: 11/13/2015 11:35   Dg Chest Port 1 View  11/12/2015  CLINICAL DATA:  Acute respiratory failure EXAM: PORTABLE CHEST 1 VIEW COMPARISON:  Yesterday FINDINGS: Endotracheal tube tip in good position at the clavicular heads. Right subclavian central line with tip at the lower SVC. New enteric tube at least reaches the stomach. Multiple EKG leads overlap the upper chest and create artifact. Increased hazy opacity at the basilar chest. No air bronchograms. No pneumothorax. Stable cardiomegaly and aortic contour after CABG. IMPRESSION: 1. Increased or layering right pleural effusion and atelectasis. 2. Stable positioning of tubes and central line. Electronically Signed   By: Monte Fantasia  M.D.   On: 11/12/2015 08:36   Dg Chest Port 1 View  11/11/2015  CLINICAL DATA:  Hypoxia EXAM: PORTABLE CHEST 1 VIEW COMPARISON:  November 10, 2015 FINDINGS: Endotracheal tube tip is 2.4 cm above the carina. Central catheter tip is in the superior vena cava. Nasogastric tube tip and side port are in the stomach. No pneumothorax. Patchy bibasilar atelectasis is stable. Lungs elsewhere clear. Heart is enlarged with pulmonary vascularity within normal limits. No adenopathy. IMPRESSION: Tube and catheter positions as described without pneumothorax. Stable patchy bibasilar atelectasis. No new opacity. Stable cardiac prominence. Electronically Signed   By: Lowella Grip III M.D.   On: 11/11/2015 08:06   Dg Chest Port 1 View  11/10/2015  CLINICAL DATA:  80 year old male with endotracheal tube and enteric tube  placement. EXAM: PORTABLE CHEST 1 VIEW COMPARISON:  Radiograph dated 11/10/2015 FINDINGS: Endotracheal tube approximately 2.3 cm above the carina. Recommend retraction and repositioning by approximately 4 cm for optimal positioning. An enteric tube is partially visualized coursing to the left upper abdomen. The tip of the enteric tube is beyond the image cut off. A right subclavian central venous catheter with tip at the cavoatrial junction. Single-view of the chest demonstrates mild diffuse interstitial prominence which appear slightly improved compared to the prior study. There is no focal consolidation, pleural effusion, or pneumothorax. Stable cardiomegaly. Median sternotomy wires and CABG vascular clips. No acute osseous pathology. IMPRESSION: Interval placement of support line and tubes as described. Recommend retraction of the endotracheal tube by approximately 4 cm for optimal positioning. No pneumothorax. Electronically Signed   By: Anner Crete M.D.   On: 11/10/2015 23:13   Dg Chest Portable 1 View  11/10/2015  CLINICAL DATA:  Respiratory distress for 2 hours. EXAM: PORTABLE CHEST 1 VIEW COMPARISON:  None. FINDINGS: Patient is status post median sternotomy. Slight discontinuity of the most superior sternotomy wire. Remainder of the sternotomy wires appear intact and normally aligned. There is cardiomegaly, at least moderate in degree. Patchy opacities are seen throughout both lungs, predominantly interstitial in appearance, suggesting edema. No large pleural effusion. No pneumothorax seen. No acute osseous abnormality seen. IMPRESSION: Cardiomegaly with presumed interstitial edema throughout both lungs suggesting congestive heart failure. Electronically Signed   By: Franki Cabot M.D.   On: 11/10/2015 20:50

## 2015-12-11 ENCOUNTER — Encounter: Payer: Self-pay | Admitting: Internal Medicine

## 2015-12-15 ENCOUNTER — Ambulatory Visit (HOSPITAL_COMMUNITY): Payer: Medicare Other | Attending: Internal Medicine | Admitting: Physical Therapy

## 2015-12-15 DIAGNOSIS — Z89612 Acquired absence of left leg above knee: Secondary | ICD-10-CM | POA: Diagnosis present

## 2015-12-15 DIAGNOSIS — R29898 Other symptoms and signs involving the musculoskeletal system: Secondary | ICD-10-CM | POA: Diagnosis present

## 2015-12-15 DIAGNOSIS — S78112A Complete traumatic amputation at level between left hip and knee, initial encounter: Secondary | ICD-10-CM

## 2015-12-15 DIAGNOSIS — R269 Unspecified abnormalities of gait and mobility: Secondary | ICD-10-CM | POA: Diagnosis present

## 2015-12-15 NOTE — Patient Instructions (Signed)
Straight Leg Raise (Prone)    Abdomen and head supported, keep left knee locked and raise leg at hip. Avoid arching low back. Repeat _10-15___ times per set. Do _1___ sets per session. Do ___3_ sessions per day.  http://orth.exer.us/1112   Copyright  VHI. All rights reserved.

## 2015-12-15 NOTE — Therapy (Signed)
[facility] [address] [place], [name], [address] Phone: [phone]   Fax:  [phone]  Physical Therapy Evaluation  Patient Details  Name: [name] MRN: [id number] Date of Birth: 1936/10/15 Referring Provider: Dr. Linton Ham  Encounter Date: 12/15/2015      PT End of Session - 12/15/15 1533    Visit Number 1   Number of Visits 24   Date for PT Re-Evaluation 01/14/16   Authorization Type medicare   PT Start Time 1300   PT Stop Time 1345   PT Time Calculation (min) 45 min   Equipment Utilized During Treatment Gait belt   Activity Tolerance Patient tolerated treatment well   Behavior During Therapy [facility] for tasks assessed/performed      Past Medical History  Diagnosis Date  . Arteriosclerotic cardiovascular disease (ASCVD)     CABG in 1990s; 2002 total obstruction of LAD, CX and RCA with patent grafts and nl EF; Stress nuc. 2008 - mild LV dilation; normal EF; questionable small anteroapical scar; no ischemia  . Atrial fibrillation ([place])   . Chronic anticoagulation   . Arthritis     "left knee" (11/08/2015)  . Pedal edema     "not a lot", per pt.  . Diabetic peripheral neuropathy (HCC)     bilateral lower leg, hands  . Hypertension     states under control with meds., has been on med. x "long time"  . Peripheral vascular disease ([place])   . 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 Dental crowns present   . Runny nose 07/27/2015    clear drainage, per pt.  . Myocardial infarction ([place]) 1990s    "after my jaw OR"  . Type II diabetes mellitus ([place])     "diet controlled" (11/08/2015)  . Iron deficiency anemia   . History of blood transfusion     "related to my leg surgery?"  . Cellulitis 11/08/2015    "both feet"  . Jaw cancer ([place]) 1990s    "squamous cell"    Past Surgical History  Procedure Laterality Date  . Squamous cell carcinoma excision Left 1993    "took my jaw out; got cadavar  in there now"  . Tonsillectomy    . Orif tibia fracture Left ~ 1970  . Colonoscopy N/A 11/27/2014    Procedure: COLONOSCOPY;  Surgeon: Rogene Houston, MD;  [facility];  Service: Endoscopy;  Laterality: N/A;  830  . Incision and drainage abscess Left 06/16/2005    wide exc. abscess 5th toe  . Mandibular hardware removal Left 08/02/2015    Procedure:  HARDWARE REMOVAL TWO MANDIBULAR SCREWS;  Surgeon: Jannette Fogo, DDS;  Location: [facility];  Service: Oral Surgery;  Laterality: Left;  . Fracture surgery    . Cataract extraction w/ intraocular lens  implant, bilateral Bilateral 09/2015  . Cardiac catheterization  1990s; 04/16/2001  . Coronary artery bypass graft  1990's    "CABG X4"  . I&d extremity Left 11/10/2015    Procedure: INCISION AND DRAINAGE LEFT FOOT, AMPUTATION OF LEFT THIRD TOE;  Surgeon: Conrad [place], MD;  Location: [facility];  Service: Vascular;  Laterality: Left;  . Amputation Left 11/11/2015    Procedure: LEFT ABOVE KNEE AMPUTATION;  Surgeon: Angelia Mould, MD;  Location: [facility];  Service: Vascular;  Laterality: Left;    There were no vitals filed for this visit.  Visit Diagnosis:  Above knee amputation  of left lower extremity ([place])  Abnormality of gait  Bilateral leg weakness      Subjective Assessment - 12/15/15 1300    Subjective Mr. Vogel states that he had his foot amputated on 11/10/2015 and then had to have an emergency revision which ended up being an above knee amputation on 11/11/2015.  He was released to Skilled nursiong on 11/16/2015 and was discharged on 12/07/2015.  He is now being referred to outpatient physcial therapy to maximize his functioning level.     Pertinent History atrial fibullation, HTN, OA, PVD, hx of skin cancer ,   How long can you sit comfortably? no problem   How long can you walk comfortably? currently does not have his prothesis but is able to walk with his walker for 225 feet.    Patient Stated Goals To be able  to walk with his prothesis.    Currently in Pain? Yes   Pain Score 2    Pain Location Calf  phantom pain on Lt LE    Pain Orientation Left   Pain Descriptors / Indicators Sore   Pain Type Acute pain   Pain Onset More than a month ago   Pain Frequency Intermittent   Aggravating Factors  lying down    Pain Relieving Factors rubbing             [facility] PT Assessment - 12/15/15 1310    Assessment   Medical Diagnosis Lt AKA   Referring Provider Dr. Linton Ham   Onset Date/Surgical Date 11/11/15   Hand Dominance Right   Next MD Visit no follow up    Prior Therapy SNF   Precautions   Precautions Fall   Restrictions   Weight Bearing Restrictions No   Balance Screen   Has the patient fallen in the past 6 months No   Has the patient had a decrease in activity level because of a fear of falling?  Yes   Is the patient reluctant to leave their home because of a fear of falling?  No   Home Environment   Living Environment Private residence   Home Access Ramped entrance   [place] One level   Prior Function   Level of [place] Retired   Leisure watch grandson play baseball, active in church and choir as well as enjoying working out in the yard.     Cognition   Overall Cognitive Status Within Functional Limits for tasks assessed   Functional Tests   Functional tests Single leg stance;Sit to Stand   Single Leg Stance   Comments Rt 3 seconds    Sit to Stand   Comments one UE assist 5 x in 46.23    ROM / Strength   AROM / PROM / Strength AROM;Strength   AROM   Overall AROM  Within functional limits for tasks performed   Strength   Strength Assessment Site Hip;Knee   Right/Left Hip Right;Left   Right Hip Flexion 5/5   Right Hip Extension 3+/5   Right Hip ABduction 5/5   Left Hip Flexion 5/5   Left Hip Extension 4-/5   Left Hip ABduction 5/5   Right/Left Knee Right   Right Knee Extension 5/5   Ambulation/Gait   Ambulation/Gait Yes    Ambulation/Gait Assistance 5: Supervision   Ambulation/Gait Assistance Details 42' in 69' with walker no prothesis    Assistive device Rolling walker  PT Education - 12/15/15 1531    Education provided Yes   Education Details HEP   Person(s) Educated Patient   Methods Explanation   Comprehension Verbalized understanding          PT Short Term Goals - 12/15/15 1323    PT SHORT TERM GOAL #1   Title Pt to be able to [name] and [name] independently   Time 2   Period Weeks   Status New   PT SHORT TERM GOAL #2   Title Pt to be able to ambulate 800 ft with least assistive device with prothesis in 6 minutes to attend church and community activiies    Time 2   Period Weeks   Status New   PT SHORT TERM GOAL #3   Title Pt gluteal maximus mm to be at least 4/5 to allow pt to come from sit to stand with no UE assist to be able to sit in chairs without arms.    Time 2   Period Weeks   Status New           PT Long Term Goals - 12/15/15 1556    PT LONG TERM GOAL #1   Title Pt to be able to tolerate standing with no upper extremity support for 10 minutes to be able to sing in the choir again    Time 4   Period Weeks   Status New   PT LONG TERM GOAL #2   Title Pt to be able to ambulate with his prothesis with a cane or least assisve device  for short distances to be able to have one hand free to carry coffee; or glass of water    Period Weeks   Status New   PT LONG TERM GOAL #3   Title Pt to be able to ambulate with prothesis on uneven surfaces for 1000 feet with walker to allow pt to walk over uneven ground to go to grandson's baseball games    Time 8   Period Weeks   Status New   PT LONG TERM GOAL #4   Title Pt to be able to go up and down 4 steps with upper extremity suppport on railings to be able to exit buildings in case of emergency   Time 8   Period Weeks   Status New               Plan - 12/15/15 1534     Clinical Impression Statement Pt is a 80 yo male who has had a recent left above knee amputation.  He has not recieved his prothesis yet but expects to have it in the next one to two weeks. He has been referred to skilled physical therapy to maximize his current functional potential .  Evaluation demonstrates decreased gluteal maximus strength bilaterally causing difficulty coming from sit to stand; decreased balance causing pt to have difficuly walkiing over uneven ground, and decreased knowledge of use of prothesis (pt has not received prothesis at this time). and abnormal gait.  He will benefit from skilled Physical therapy to address these issues and maximize his functional ability.   Pt will benefit from skilled therapeutic intervention in order to improve on the following deficits Abnormal gait;Decreased activity tolerance;Decreased balance;Decreased knowledge of use of DME;Decreased mobility;Decreased strength;Difficulty walking;Prosthetic Dependency   Rehab Potential Good   PT Frequency 3x / week   PT Duration 8 weeks   PT Treatment/Interventions ADLs/Self Care Home Management;Therapeutic activities;Therapeutic exercise;Balance training;Patient/family education;Prosthetic Training  PT Next Visit Plan If pt has prothesis begin Donning and Doffing instructions as well as standing without UE support and gait training with prothesis.  If pt has not recieved prothesis work on sit to stand, triped with Rt arm raise, add wt to prone hip extension, and gait for functional mobility tolerance.    PT Home Exercise Plan given    Consulted and Agree with Plan of Care Patient          G-Codes - 12-28-2015 1545    Functional Assessment Tool Used clinical judgement    Functional Limitation Mobility: Walking and moving around   Mobility: Walking and Moving Around Current Status [phone]) At least 40 percent but less than 60 percent impaired, limited or restricted   Mobility: Walking and Moving Around Goal  Status [phone]) At least 1 percent but less than 20 percent impaired, limited or restricted       Problem List Patient Active Problem List   Diagnosis Date Noted  . Weight gain 11/30/2015  . Coronary artery disease involving coronary bypass graft of native heart without angina pectoris   . Tachycardia   . Leukocytosis   . Abnormality of gait   . Status post above knee amputation of left lower extremity ([place])   . Acute blood loss anemia   . Acute pulmonary edema (HCC)   . Acute congestive heart failure ([place])   . Acute respiratory failure with hypoxia ([place])   . Pressure ulcer 11/09/2015  . Necrotic toes ([place]) 11/08/2015  . Lower extremity cellulitis 11/08/2015  . Bradycardia 05/28/2015  . PVD (peripheral vascular disease) with claudication ([place]) 09/30/2014  . Encounter for therapeutic drug monitoring 11/26/2013  . Peripheral vascular disease ([place]) 08/06/2012  . Chronic anticoagulation 01/20/2011  . Diabetes mellitus ([place]) 03/04/2010  . Dyslipidemia 03/04/2010  . Essential hypertension 03/04/2010  . Hx of CABG 03/04/2010  . Permanent atrial fibrillation [facility]) 04/08/2009   Rayetta Humphrey, PT CLT [phone]  12-28-2015, 4:01 PM  [facility] [address] [place], [name], [address] Phone: [phone]   Fax:  [phone]  Name: KAYLEB WARSHAW MRN: [id number] Date of Birth: August 16, 1936

## 2015-12-16 ENCOUNTER — Encounter (HOSPITAL_COMMUNITY): Payer: Self-pay

## 2015-12-16 ENCOUNTER — Ambulatory Visit (HOSPITAL_COMMUNITY): Payer: Medicare Other

## 2015-12-16 DIAGNOSIS — R29898 Other symptoms and signs involving the musculoskeletal system: Secondary | ICD-10-CM

## 2015-12-16 DIAGNOSIS — S78112A Complete traumatic amputation at level between left hip and knee, initial encounter: Secondary | ICD-10-CM

## 2015-12-16 DIAGNOSIS — R269 Unspecified abnormalities of gait and mobility: Secondary | ICD-10-CM

## 2015-12-16 DIAGNOSIS — Z89612 Acquired absence of left leg above knee: Secondary | ICD-10-CM | POA: Diagnosis not present

## 2015-12-16 NOTE — Therapy (Signed)
[facility] [address] [place], [name], [address] Phone: [phone]   Fax:  [phone]  Physical Therapy Treatment  Patient Details  Name: [name] MRN: [id number] Date of Birth: 1936-08-13 Referring Provider: Dr. Linton Ham  Encounter Date: 12/16/2015      PT End of Session - 12/16/15 1305    Visit Number 2   Number of Visits 24   Date for PT Re-Evaluation 01/14/16   Authorization Type medicare   PT Start Time 1301   PT Stop Time 1345   PT Time Calculation (min) 44 min   Equipment Utilized During Treatment Gait belt   Activity Tolerance Patient tolerated treatment well   Behavior During Therapy [facility] for tasks assessed/performed      Past Medical History  Diagnosis Date  . Arteriosclerotic cardiovascular disease (ASCVD)     CABG in 1990s; 2002 total obstruction of LAD, CX and RCA with patent grafts and nl EF; Stress nuc. 2008 - mild LV dilation; normal EF; questionable small anteroapical scar; no ischemia  . Atrial fibrillation ([place])   . Chronic anticoagulation   . Arthritis     "left knee" (11/08/2015)  . Pedal edema     "not a lot", per pt.  . Diabetic peripheral neuropathy (HCC)     bilateral lower leg, hands  . Hypertension     states under control with meds., has been on med. x "long time"  . Peripheral vascular disease ([place])   . 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 Dental crowns present   . Runny nose 07/27/2015    clear drainage, per pt.  . Myocardial infarction ([place]) 1990s    "after my jaw OR"  . Type II diabetes mellitus ([place])     "diet controlled" (11/08/2015)  . Iron deficiency anemia   . History of blood transfusion     "related to my leg surgery?"  . Cellulitis 11/08/2015    "both feet"  . Jaw cancer ([place]) 1990s    "squamous cell"    Past Surgical History  Procedure Laterality Date  . Squamous cell carcinoma excision Left 1993    "took my jaw out; got cadavar  in there now"  . Tonsillectomy    . Orif tibia fracture Left ~ 1970  . Colonoscopy N/A 11/27/2014    Procedure: COLONOSCOPY;  Surgeon: Rogene [place], MD;  [facility];  Service: Endoscopy;  Laterality: N/A;  830  . Incision and drainage abscess Left 06/16/2005    wide exc. abscess 5th toe  . Mandibular hardware removal Left 08/02/2015    Procedure:  HARDWARE REMOVAL TWO MANDIBULAR SCREWS;  Surgeon: Jannette Fogo, DDS;  Location: [facility];  Service: Oral Surgery;  Laterality: Left;  . Fracture surgery    . Cataract extraction w/ intraocular lens  implant, bilateral Bilateral 09/2015  . Cardiac catheterization  1990s; 04/16/2001  . Coronary artery bypass graft  1990's    "CABG X4"  . I&d extremity Left 11/10/2015    Procedure: INCISION AND DRAINAGE LEFT FOOT, AMPUTATION OF LEFT THIRD TOE;  Surgeon: Conrad [place], MD;  Location: [facility];  Service: Vascular;  Laterality: Left;  . Amputation Left 11/11/2015    Procedure: LEFT ABOVE KNEE AMPUTATION;  Surgeon: Angelia Mould, MD;  Location: [facility];  Service: Vascular;  Laterality: Left;    There were no vitals filed for this visit.  Visit Diagnosis:  Above knee amputation  of left lower extremity (HCC)  Abnormality of gait  Bilateral leg weakness      Subjective Assessment - 12/16/15 1305    Subjective Pt denied pain upon arrival, but noted minor soreness after yesterday's PT eval. No new complaints reported upon arrival.    Pertinent History atrial fibullation, HTN, OA, PVD, hx of skin cancer ,   How long can you sit comfortably? no problem   How long can you walk comfortably? currently does not have his prothesis but is able to walk with his walker for 225 feet.    Patient Stated Goals To be able to walk with his prothesis.    Currently in Pain? No/denies   Multiple Pain Sites No            [facility] PT Assessment - 12/15/15 1310    Assessment   Medical Diagnosis Lt AKA   Referring Provider Dr. Linton Ham   Onset Date/Surgical Date 11/11/15   Hand Dominance Right   Next MD Visit no follow up    Prior Therapy SNF   Precautions   Precautions Fall   Restrictions   Weight Bearing Restrictions No   Balance Screen   Has the patient fallen in the past 6 months No   Has the patient had a decrease in activity level because of a fear of falling?  Yes   Is the patient reluctant to leave their home because of a fear of falling?  No   Home Environment   Living Environment Private residence   Home Access Ramped entrance   [place] One level   Prior Function   Level of [place] Retired   Leisure watch grandson play baseball    Cognition   Overall Cognitive Status Within Functional Limits for tasks assessed   Functional Tests   Functional tests Single leg stance;Sit to Stand   Single Leg Stance   Comments Rt 3 seconds    Sit to Stand   Comments one UE assist 5 x in 46.23    ROM / Strength   AROM / PROM / Strength AROM;Strength   AROM   Overall AROM  Within functional limits for tasks performed   Strength   Strength Assessment Site Hip;Knee   Right/Left Hip Right;Left   Right Hip Flexion 5/5   Right Hip Extension 3+/5   Right Hip ABduction 5/5   Left Hip Flexion 5/5   Left Hip Extension 4-/5   Left Hip ABduction 5/5   Right/Left Knee Right   Right Knee Extension 5/5   Ambulation/Gait   Ambulation/Gait Yes   Ambulation/Gait Assistance 5: Supervision   Ambulation/Gait Assistance Details 78' in 6' with walker no prothesis    Assistive device Rolling walker                     [facility] Adult PT Treatment/Exercise - 12/16/15 0001    Ambulation/Gait   Ambulation/Gait Yes   Ambulation/Gait Assistance 5: Supervision   Ambulation Distance (Feet) 226 Feet   Assistive device Rolling walker   Gait Pattern --  hop to gait pattern    Gait Comments Gait training completed with close supervision; focus on core bracing and optimal posture    Knee/Hip Exercises: Stretches   Other Knee/Hip Stretches Prone hip flexor stretch with pillow/towel placed under distal thigh; 3 sets of 30 sec    Knee/Hip Exercises: Standing   Other Standing Knee Exercises Repeated sit to stand x 1 set of 10  reps with B UE assist    Knee/Hip Exercises: Supine   Straight Leg Raises AROM;Strengthening;Left;2 sets;10 reps   Other Supine Knee/Hip Exercises Glute sets x 1 set of 15 reps with 5 sec hold    Knee/Hip Exercises: Sidelying   Hip ABduction Left;Strengthening;2 sets;10 reps   Knee/Hip Exercises: Prone   Hip Extension Left;Strengthening;2 sets;10 reps   Other [place] Exercises Triped position with focus on core bracing with R UE lift x 1 sets of 10 reps  Tactile and manual cues required for proper pelvic alignment                PT Education - 12/16/15 1349    Education provided Yes   Education Details Edcuated pt on current HEP, optimal seated/standing posture, and importance of core bracing while ambulating    Person(s) Educated Patient;Spouse   Methods Explanation;Demonstration   Comprehension Verbalized understanding;Returned demonstration          PT Short Term Goals - 12/15/15 1323    PT SHORT TERM GOAL #1   Title Pt to be able to [name] and [name] independently   Time 2   Period Weeks   Status New   PT SHORT TERM GOAL #2   Title Pt to be able to ambulate 800 ft with least assistive device with prothesis in 6 minutes to attend church and community activiies    Time 2   Period Weeks   Status New   PT SHORT TERM GOAL #3   Title Pt gluteal maximus mm to be at least 4/5 to allow pt to come from sit to stand with no UE assist to be able to sit in chairs without arms.    Time 2   Period Weeks   Status New           PT Long Term Goals - 12/15/15 1556    PT LONG TERM GOAL #1   Title Pt to be able to tolerate standing with no upper extremity support for 10 minutes to be able to sing in the choir again    Time 4    Period Weeks   Status New   PT LONG TERM GOAL #2   Title Pt to be able to ambulate with his prothesis with a cane or least assisve device  for short distances to be able to have one hand free to carry coffee; or glass of water    Period Weeks   Status New   PT LONG TERM GOAL #3   Title Pt to be able to ambulate with prothesis on uneven surfaces for 1000 feet with walker to allow pt to walk over uneven ground to go to grandson's baseball games    Time 8   Period Weeks   Status New   PT LONG TERM GOAL #4   Title Pt to be able to go up and down 4 steps with upper extremity suppport on railings to be able to exit buildings in case of emergency   Time 8   Period Weeks   Status New               Plan - 12/16/15 1302    Clinical Impression Statement PT tx focused on L hip SLR into extension/flexion/abd, gait training, L hip stretching with good tolerance reported. Progressed to Triped position with focus on core bracing with intermittent R arm lift. Pt demo difficulty with maintaining neutral spine/pelvic position. Manual and tactile cues required to improve pelvic mobility. Added  prone L hip flexor stretch with use of towels under distal thigh. Ended PT tx with sit to stand transfers and gait training with FWW with focus on AD positioning and core bracing.  Good tolerance reported with today's PT visit with no exacerbation of pain reported. Continue with current POC.   Pt will benefit from skilled therapeutic intervention in order to improve on the following deficits Abnormal gait;Decreased activity tolerance;Decreased balance;Decreased knowledge of use of DME;Decreased mobility;Decreased strength;Difficulty walking;Prosthetic Dependency   Rehab Potential Good   PT Frequency 3x / week   PT Duration 8 weeks   PT Treatment/Interventions ADLs/Self Care Home Management;Therapeutic activities;Therapeutic exercise;Balance training;Patient/family education;Prosthetic Training   PT Next Visit Plan  If pt has prothesis begin Donning and Doffing instructions as well as standing without UE support and gait training with prothesis.  If pt has not recieved prothesis work on sit to stand, triped with Rt arm raise, add wt to prone hip extension, and gait for functional mobility tolerance.    PT Home Exercise Plan Reviewed HEP    Consulted and Agree with Plan of Care Patient                                Problem List Patient Active Problem List   Diagnosis Date Noted  . Weight gain 11/30/2015  . Coronary artery disease involving coronary bypass graft of native heart without angina pectoris   . Tachycardia   . Leukocytosis   . Abnormality of gait   . Status post above knee amputation of left lower extremity ([place])   . Acute blood loss anemia   . Acute pulmonary edema (HCC)   . Acute congestive heart failure ([place])   . Acute respiratory failure with hypoxia ([place])   . Pressure ulcer 11/09/2015  . Necrotic toes ([place]) 11/08/2015  . Lower extremity cellulitis 11/08/2015  . Bradycardia 05/28/2015  . PVD (peripheral vascular disease) with claudication ([place]) 09/30/2014  . Encounter for therapeutic drug monitoring 11/26/2013  . Peripheral vascular disease ([place]) 08/06/2012  . Chronic anticoagulation 01/20/2011  . Diabetes mellitus ([place]) 03/04/2010  . Dyslipidemia 03/04/2010  . Essential hypertension 03/04/2010  . Hx of CABG 03/04/2010  . Permanent atrial fibrillation ([place]) 04/08/2009    Garen Lah, PT, DPT  12/16/2015, 1:52 PM  [facility] [address] [place], [name], [address] Phone: [phone]   Fax:  [phone]  Name: [name] MRN: [id number] Date of Birth: July 15, 1936

## 2015-12-17 ENCOUNTER — Ambulatory Visit (HOSPITAL_COMMUNITY): Payer: Medicare Other | Admitting: Physical Therapy

## 2015-12-17 DIAGNOSIS — R269 Unspecified abnormalities of gait and mobility: Secondary | ICD-10-CM

## 2015-12-17 DIAGNOSIS — Z89612 Acquired absence of left leg above knee: Secondary | ICD-10-CM | POA: Diagnosis not present

## 2015-12-17 DIAGNOSIS — R29898 Other symptoms and signs involving the musculoskeletal system: Secondary | ICD-10-CM

## 2015-12-17 DIAGNOSIS — S78112A Complete traumatic amputation at level between left hip and knee, initial encounter: Secondary | ICD-10-CM

## 2015-12-17 NOTE — Therapy (Signed)
[name] [address] [place], [name], [address] Phone: [phone]   Fax:  [phone]  Physical Therapy Treatment  Patient Details  Name: [name] MRN: [id number] Date of Birth: January 31, 1936 Referring Provider: Dr. Linton [name]  Encounter Date: 12/17/2015      PT End of Session - 12/17/15 0842    Visit Number 3   Number of Visits 24   Date for PT Re-Evaluation 01/14/16   Authorization Type medicare   PT Start Time 0800   PT Stop Time 0842   PT Time Calculation (min) 42 min   Equipment Utilized During Treatment Gait belt   Activity Tolerance Patient tolerated treatment well   Behavior During Therapy [facility] for tasks assessed/performed      Past Medical History  Diagnosis Date  . Arteriosclerotic cardiovascular disease (ASCVD)     CABG in 1990s; 2002 total obstruction of LAD, CX and RCA with patent grafts and nl EF; Stress nuc. 2008 - mild LV dilation; normal EF; questionable small anteroapical scar; no ischemia  . Atrial fibrillation ([place])   . Chronic anticoagulation   . Arthritis     "left knee" (11/08/2015)  . Pedal edema     "not a lot", per pt.  . Diabetic peripheral neuropathy (HCC)     bilateral lower leg, hands  . Hypertension     states under control with meds., has been on med. x "long time"  . Peripheral vascular disease ([place])   . 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 Dental crowns present   . Runny nose 07/27/2015    clear drainage, per pt.  . Myocardial infarction ([place]) 1990s    "after my jaw OR"  . Type II diabetes mellitus ([place])     "diet controlled" (11/08/2015)  . Iron deficiency anemia   . History of blood transfusion     "related to my leg surgery?"  . Cellulitis 11/08/2015    "both feet"  . Jaw cancer ([place]) 1990s    "squamous cell"    Past Surgical History  Procedure Laterality Date  . Squamous cell carcinoma excision Left 1993    "took my jaw out; got cadavar  in there now"  . Tonsillectomy    . Orif tibia fracture Left ~ 1970  . Colonoscopy N/A 11/27/2014    Procedure: COLONOSCOPY;  Surgeon: [name] Houston, MD;  [facility];  Service: Endoscopy;  Laterality: N/A;  830  . Incision and drainage abscess Left 06/16/2005    wide exc. abscess 5th toe  . Mandibular hardware removal Left 08/02/2015    Procedure:  HARDWARE REMOVAL TWO MANDIBULAR SCREWS;  Surgeon: [name], DDS;  Location: [place];  Service: Oral Surgery;  Laterality: Left;  . Fracture surgery    . Cataract extraction w/ intraocular lens  implant, bilateral Bilateral 09/2015  . Cardiac catheterization  1990s; 04/16/2001  . Coronary artery bypass graft  1990's    "CABG X4"  . I&d extremity Left 11/10/2015    Procedure: INCISION AND DRAINAGE LEFT FOOT, AMPUTATION OF LEFT THIRD TOE;  Surgeon: [name] , MD;  Location: [place];  Service: Vascular;  Laterality: Left;  . Amputation Left 11/11/2015    Procedure: LEFT ABOVE KNEE AMPUTATION;  Surgeon: Angelia Mould, MD;  Location: [place];  Service: Vascular;  Laterality: Left;    There were no vitals filed for this visit.  Visit Diagnosis:  Abnormality of gait  Above knee amputation of left lower extremity (HCC)  Bilateral leg weakness      Subjective Assessment - 12/17/15 0801    Subjective Pt states he is doing his exercises; Pt walked across a gravel lot yesterday which gave him some trouble    Currently in Pain? No/denies                [facility] Adult PT Treatment/Exercise - 12/17/15 0001    Ambulation/Gait   Gait Comments Gait training completed with close supervision; focus on core bracing and optimal posture   Exercises   Exercises Lumbar;Knee/Hip   Lumbar Exercises: Stretches   Prone on Elbows Stretch 5 reps;20 seconds  repeat x 2   Press Ups 10 seconds   Lumbar Exercises: Standing   Other Standing Lumbar Exercises SLS x 5    Lumbar Exercises: Seated   Sit to Stand 10 reps    Sit to Stand Limitations no walker    Lumbar Exercises: Prone   Opposite Arm/Leg Raise Right arm/Left leg;Left arm/Right leg;10 reps   Other Prone Lumbar Exercises shoulder extension; rows x 10 each    Lumbar Exercises: Quadruped   Opposite Arm/Leg Raise Right arm/Left leg;10 reps   Opposite Arm/Leg Raise Limitations tripod    Knee/Hip Exercises: Stretches   Other Knee/Hip Stretches Prone hip flexor stretch with pillow/towel placed under distal thigh; 3 sets of 30 sec    Knee/Hip Exercises: Sidelying   Hip ABduction Strengthening;Left;2 sets;15 reps   Hip ABduction Limitations 5#   Knee/Hip Exercises: Prone   Hip Extension Left;Strengthening;2 sets;10 reps   Hip Extension Limitations 5#                PT Education - 12/16/15 1349    Education provided Yes   Education Details Edcuated pt on current HEP, optimal seated/standing posture, and importance of core bracing while ambulating    Person(s) Educated Patient;Spouse   Methods Explanation;Demonstration   Comprehension Verbalized understanding;Returned demonstration          PT Short Term Goals - 12/17/15 0848    PT SHORT TERM GOAL #1   Title Pt to be able to [name] and [name] independently   Period Weeks   Status Not Met   PT SHORT TERM GOAL #2   Title Pt to be able to ambulate 800 ft with least assistive device with prothesis in 6 minutes to attend church and community activiies    Time 2   Period Weeks   Status Not Met   PT SHORT TERM GOAL #3   Title Pt gluteal maximus mm to be at least 4/5 to allow pt to come from sit to stand with no UE assist to be able to sit in chairs without arms.    Time 2   Period Weeks   Status On-going           PT Long Term Goals - 12/17/15 0848    PT LONG TERM GOAL #1   Title Pt to be able to tolerate standing with no upper extremity support for 10 minutes to be able to sing in the choir again    Time 4   Period Weeks   Status On-going   PT LONG TERM GOAL #2   Title  Pt to be able to ambulate with his prothesis with a cane or least assisve device  for short distances to be able to have one hand free to carry coffee; or glass of water    Time 6  Period Weeks   Status Not Met   PT LONG TERM GOAL #3   Title Pt to be able to ambulate with prothesis on uneven surfaces for 1000 feet with walker to allow pt to walk over uneven ground to go to grandson's baseball games    Time 8   Period Weeks   Status Not Met   PT LONG TERM GOAL #4   Title Pt to be able to go up and down 4 steps with upper extremity suppport on railings to be able to exit buildings in case of emergency   Time 8   Period Weeks   Status Not Met               Plan - 12/17/15 0843    Clinical Impression Statement Pt walker raised two notches to fit pt appropriately.  Pt continues to ambulate with forward bent position therefore treatment focused on improving core and back strength.  Pt continues to need cuing on pelvic positioning with Rt arm/Lt leg lift.  Pt able to balance with right leg only x 8 seconds today.     PT Next Visit Plan If pt has prothesis begin [place] and [place] instructions as well as standing without UE support and gait training with prothesis.  Continue with back and core strengthening         Problem List Patient Active Problem List   Diagnosis Date Noted  . Weight gain 11/30/2015  . Coronary artery disease involving coronary bypass graft of native heart without angina pectoris   . Tachycardia   . Leukocytosis   . Abnormality of gait   . Status post above knee amputation of left lower extremity ([place])   . Acute blood loss anemia   . Acute pulmonary edema (HCC)   . Acute congestive heart failure ([place])   . Acute respiratory failure with hypoxia ([place])   . Pressure ulcer 11/09/2015  . Necrotic toes ([place]) 11/08/2015  . Lower extremity cellulitis 11/08/2015  . Bradycardia 05/28/2015  . PVD (peripheral vascular disease) with claudication ([place]) 09/30/2014  .  Encounter for therapeutic drug monitoring 11/26/2013  . Peripheral vascular disease ([place]) 08/06/2012  . Chronic anticoagulation 01/20/2011  . Diabetes mellitus ([place]) 03/04/2010  . Dyslipidemia 03/04/2010  . Essential hypertension 03/04/2010  . Hx of CABG 03/04/2010  . Permanent atrial fibrillation [facility] 04/08/2009    Rayetta Humphrey, PT CLT [phone] 12/17/2015, 8:49 AM  [facility] [address] [place], [name], [address] Phone: [phone]   Fax:  [phone]  Name: [name] MRN: [id number] Date of Birth: [birth year]

## 2015-12-22 ENCOUNTER — Ambulatory Visit (INDEPENDENT_AMBULATORY_CARE_PROVIDER_SITE_OTHER): Payer: Medicare Other | Admitting: *Deleted

## 2015-12-22 ENCOUNTER — Ambulatory Visit (HOSPITAL_COMMUNITY): Payer: Medicare Other | Attending: Internal Medicine | Admitting: Physical Therapy

## 2015-12-22 DIAGNOSIS — R29898 Other symptoms and signs involving the musculoskeletal system: Secondary | ICD-10-CM

## 2015-12-22 DIAGNOSIS — Z5181 Encounter for therapeutic drug level monitoring: Secondary | ICD-10-CM

## 2015-12-22 DIAGNOSIS — I482 Chronic atrial fibrillation: Secondary | ICD-10-CM

## 2015-12-22 DIAGNOSIS — R269 Unspecified abnormalities of gait and mobility: Secondary | ICD-10-CM | POA: Diagnosis not present

## 2015-12-22 DIAGNOSIS — Z89612 Acquired absence of left leg above knee: Secondary | ICD-10-CM | POA: Diagnosis present

## 2015-12-22 DIAGNOSIS — I4821 Permanent atrial fibrillation: Secondary | ICD-10-CM

## 2015-12-22 DIAGNOSIS — S78112A Complete traumatic amputation at level between left hip and knee, initial encounter: Secondary | ICD-10-CM

## 2015-12-22 LAB — POCT INR: INR: 3.4

## 2015-12-22 NOTE — Therapy (Signed)
[facility] [place], [name], [address] Phone: [phone]   Fax:  [phone]  Physical Therapy Treatment  Patient Details  Name: [name] MRN: [id number] Date of Birth: [birth year] Referring Provider: Dr. Linton Ham  Encounter Date: 12/22/2015      PT End of Session - 12/22/15 1602    Visit Number 4   Number of Visits 24   Date for PT Re-Evaluation 01/14/16   Authorization Type medicare   Authorization Time Period g-code by 9th visit, KX by 15th visit   Authorization - Visit Number 4   Authorization - Number of Visits 10   PT Start Time 8832   PT Stop Time 1558   PT Time Calculation (min) 43 min   Activity Tolerance Patient tolerated treatment well   Behavior During Therapy [facility] for tasks assessed/performed      Past Medical History  Diagnosis Date  . Arteriosclerotic cardiovascular disease (ASCVD)     CABG in 1990s; 2002 total obstruction of LAD, CX and RCA with patent grafts and nl EF; Stress nuc. 2008 - mild LV dilation; normal EF; questionable small anteroapical scar; no ischemia  . Atrial fibrillation ([place])   . Chronic anticoagulation   . Arthritis     "left knee" (11/08/2015)  . Pedal edema     "not a lot", per pt.  . Diabetic peripheral neuropathy (HCC)     bilateral lower leg, hands  . Hypertension     states under control with meds., has been on med. x "long time"  . Peripheral vascular disease ([place])   . 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 Dental crowns present   . Runny nose 07/27/2015    clear drainage, per pt.  . Myocardial infarction ([place]) 1990s    "after my jaw OR"  . Type II diabetes mellitus ([place])     "diet controlled" (11/08/2015)  . Iron deficiency anemia   . History of blood transfusion     "related to my leg surgery?"  . Cellulitis 11/08/2015    "both feet"  . Jaw cancer ([place]) 1990s    "squamous cell"    Past Surgical History  Procedure  Laterality Date  . Squamous cell carcinoma excision Left 1993    "took my jaw out; got cadavar in there now"  . Tonsillectomy    . Orif tibia fracture Left ~ 1970  . Colonoscopy N/A 11/27/2014    Procedure: COLONOSCOPY;  Surgeon: Rogene Houston, MD;  [facility];  Service: Endoscopy;  Laterality: N/A;  830  . Incision and drainage abscess Left 06/16/2005    wide exc. abscess 5th toe  . Mandibular hardware removal Left 08/02/2015    Procedure:  HARDWARE REMOVAL TWO MANDIBULAR SCREWS;  Surgeon: Jannette Fogo, DDS;  Location: [place];  Service: Oral Surgery;  Laterality: Left;  . Fracture surgery    . Cataract extraction w/ intraocular lens  implant, bilateral Bilateral 09/2015  . Cardiac catheterization  1990s; 04/16/2001  . Coronary artery bypass graft  1990's    "CABG X4"  . I&d extremity Left 11/10/2015    Procedure: INCISION AND DRAINAGE LEFT FOOT, AMPUTATION OF LEFT THIRD TOE;  Surgeon: Conrad Louisa, MD;  Location: [facility];  Service: Vascular;  Laterality: Left;  . Amputation Left 11/11/2015    Procedure: LEFT ABOVE KNEE AMPUTATION;  Surgeon: Angelia Mould, MD;  Location: [facility];  Service: Vascular;  Laterality: Left;    There were no vitals filed for this visit.  Visit Diagnosis:  Abnormality of gait  Above knee amputation of left lower extremity (HCC)  Bilateral leg weakness      Subjective Assessment - 12/22/15 1519    Subjective Doing okay overall. They have made the mold for the prosthesis but they are gone the rest of the week. Unsure when that will be ready.    Currently in Pain? No/denies                         [facility] Adult PT Treatment/Exercise - 12/22/15 0001    Ambulation/Gait   Gait Comments Amb into clinic with supervision using rw and stable pattern, no loss of balance.    Knee/Hip Exercises: Stretches   Other Knee/Hip Stretches prone Lt hip extension with manual restistance 2X10.   Knee/Hip Exercises: Supine    Hip Adduction Isometric Strengthening;Both;2 sets;10 reps;Limitations   Hip Adduction Isometric Limitations ball squeeze   Single Leg Bridge Strengthening;Right;2 sets;10 reps   Straight Leg Raises Strengthening;Left;2 sets;10 reps   Straight Leg Raises Limitations manual resist.   Knee/Hip Exercises: Sidelying   Hip ABduction Strengthening;Left;2 sets;10 reps   Hip ABduction Limitations manual resistance   Knee/Hip Exercises: Prone   Hip Extension Strengthening;Left;2 sets;10 reps   Hip Extension Limitations manual resistance   Other Prone Exercises triped- alternating arm raise, manual assist needed when lifting Lt UE. 2 sets x 3 reps each.                 PT Education - 12/22/15 1602    Education provided Yes   Education Details Reinforced need for hip extension for gait. Encouraged stretch at home.    Person(s) Educated Patient;Spouse   Methods Explanation;Demonstration   Comprehension Verbalized understanding;Returned demonstration          PT Short Term Goals - 12/17/15 0848    PT SHORT TERM GOAL #1   Title Pt to be able to [name] and [name] independently   Period Weeks   Status Not Met   PT SHORT TERM GOAL #2   Title Pt to be able to ambulate 800 ft with least assistive device with prothesis in 6 minutes to attend church and community activiies    Time 2   Period Weeks   Status Not Met   PT SHORT TERM GOAL #3   Title Pt gluteal maximus mm to be at least 4/5 to allow pt to come from sit to stand with no UE assist to be able to sit in chairs without arms.    Time 2   Period Weeks   Status On-going           PT Long Term Goals - 12/17/15 0848    PT LONG TERM GOAL #1   Title Pt to be able to tolerate standing with no upper extremity support for 10 minutes to be able to sing in the choir again    Time 4   Period Weeks   Status On-going   PT LONG TERM GOAL #2   Title Pt to be able to ambulate with his prothesis with a cane or least assisve device   for short distances to be able to have one hand free to carry coffee; or glass of water    Time 6   Period Weeks   Status Not Met   PT LONG TERM GOAL #3   Title Pt to  be able to ambulate with prothesis on uneven surfaces for 1000 feet with walker to allow pt to walk over uneven ground to go to grandson's baseball games    Time 8   Period Weeks   Status Not Met   PT LONG TERM GOAL #4   Title Pt to be able to go up and down 4 steps with upper extremity suppport on railings to be able to exit buildings in case of emergency   Time 8   Period Weeks   Status Not Met               Plan - 12/22/15 1604    Clinical Impression Statement Patient tolerating session without complaint. Working on [place] in anticipation of ambulation once prosthesis is built. Patient very motivated during session and appears to be improving with strength. Patient did ambulate into the clinic with rw, states he is using the w/c less.     PT Next Visit Plan Continue with strengthening focus through LLE. Incorporate core stabilization exercises as well. Awaiting prosthesis.    PT Home Exercise Plan review HEP   Consulted and Agree with Plan of Care Patient        Problem List Patient Active Problem List   Diagnosis Date Noted  . Weight gain 11/30/2015  . Coronary artery disease involving coronary bypass graft of native heart without angina pectoris   . Tachycardia   . Leukocytosis   . Abnormality of gait   . Status post above knee amputation of left lower extremity ([place])   . Acute blood loss anemia   . Acute pulmonary edema (HCC)   . Acute congestive heart failure ([place])   . Acute respiratory failure with hypoxia ([place])   . Pressure ulcer 11/09/2015  . Necrotic toes ([place]) 11/08/2015  . Lower extremity cellulitis 11/08/2015  . Bradycardia 05/28/2015  . PVD (peripheral vascular disease) with claudication ([place]) 09/30/2014  . Encounter for therapeutic drug monitoring 11/26/2013  .  Peripheral vascular disease ([place]) 08/06/2012  . Chronic anticoagulation 01/20/2011  . Diabetes mellitus ([place]) 03/04/2010  . Dyslipidemia 03/04/2010  . Essential hypertension 03/04/2010  . Hx of CABG 03/04/2010  . Permanent atrial fibrillation ([place]) 04/08/2009    Cassell Clement, PT, CSCS Pager [phone]  12/22/2015, 4:08 PM  [facility] [address] [place], [name], [address] Phone: [phone]   Fax:  [phone]  Name: [name] MRN: [id number] Date of Birth: [birth year]

## 2015-12-24 ENCOUNTER — Ambulatory Visit (HOSPITAL_COMMUNITY): Payer: Medicare Other | Admitting: Physical Therapy

## 2015-12-24 ENCOUNTER — Telehealth: Payer: Self-pay

## 2015-12-24 DIAGNOSIS — S78112A Complete traumatic amputation at level between left hip and knee, initial encounter: Secondary | ICD-10-CM

## 2015-12-24 DIAGNOSIS — R269 Unspecified abnormalities of gait and mobility: Secondary | ICD-10-CM | POA: Diagnosis not present

## 2015-12-24 DIAGNOSIS — R29898 Other symptoms and signs involving the musculoskeletal system: Secondary | ICD-10-CM

## 2015-12-24 DIAGNOSIS — M79604 Pain in right leg: Secondary | ICD-10-CM

## 2015-12-24 NOTE — Therapy (Signed)
[place], [name], [address] Phone: [phone]   Fax:  [phone]  Physical Therapy Treatment  Patient Details  Name: [name] MRN: [id number] Date of Birth: 05/21/1936 Referring Provider: Dr. Linton Ham  Encounter Date: 12/24/2015      PT End of Session - 12/24/15 1514    Visit Number 5   Number of Visits 24   Date for PT Re-Evaluation 01/14/16   Authorization Type medicare   Authorization Time Period g-code by 9th visit, KX by 15th visit   Authorization - Visit Number 5   Authorization - Number of Visits 10   PT Start Time 1761   PT Stop Time 1514   PT Time Calculation (min) 40 min   Activity Tolerance Patient tolerated treatment well   Behavior During Therapy [facility] for tasks assessed/performed      Past Medical History  Diagnosis Date  . Arteriosclerotic cardiovascular disease (ASCVD)     CABG in 1990s; 2002 total obstruction of LAD, CX and RCA with patent grafts and nl EF; Stress nuc. 2008 - mild LV dilation; normal EF; questionable small anteroapical scar; no ischemia  . Atrial fibrillation ([place])   . Chronic anticoagulation   . Arthritis     "left knee" (11/08/2015)  . Pedal edema     "not a lot", per pt.  . Diabetic peripheral neuropathy (HCC)     bilateral lower leg, hands  . Hypertension     states under control with meds., has been on med. x "long time"  . Peripheral vascular disease ([place])   . 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 Dental crowns present   . Runny nose 07/27/2015    clear drainage, per pt.  . Myocardial infarction ([place]) 1990s    "after my jaw OR"  . Type II diabetes mellitus ([place])     "diet controlled" (11/08/2015)  . Iron deficiency anemia   . History of blood transfusion     "related to my leg surgery?"  . Cellulitis 11/08/2015    "both feet"  . Jaw cancer ([place]) 1990s    "squamous cell"    Past Surgical History  Procedure  Laterality Date  . Squamous cell carcinoma excision Left 1993    "took my jaw out; got cadavar in there now"  . Tonsillectomy    . Orif tibia fracture Left ~ 1970  . Colonoscopy N/A 11/27/2014    Procedure: COLONOSCOPY;  Surgeon: Rogene Houston, MD;  [facility];  Service: Endoscopy;  Laterality: N/A;  830  . Incision and drainage abscess Left 06/16/2005    wide exc. abscess 5th toe  . Mandibular hardware removal Left 08/02/2015    Procedure:  HARDWARE REMOVAL TWO MANDIBULAR SCREWS;  Surgeon: Jannette Fogo, DDS;  Location: [facility];  Service: Oral Surgery;  Laterality: Left;  . Fracture surgery    . Cataract extraction w/ intraocular lens  implant, bilateral Bilateral 09/2015  . Cardiac catheterization  1990s; 04/16/2001  . Coronary artery bypass graft  1990's    "CABG X4"  . I&d extremity Left 11/10/2015    Procedure: INCISION AND DRAINAGE LEFT FOOT, AMPUTATION OF LEFT THIRD TOE;  Surgeon: Conrad Cl[place], MD;  Location: [place];  Service: Vascular;  Laterality: Left;  . Amputation Left 11/11/2015    Procedure: LEFT ABOVE KNEE AMPUTATION;  Surgeon: Angelia Mould, MD;  Location: [facility];  Service: Vascular;  Laterality: Left;    There were no vitals filed for this visit.  Visit Diagnosis:  Abnormality of gait  Above knee amputation of left lower extremity (HCC)  Bilateral leg weakness      Subjective Assessment - 12/24/15 1436    Subjective Having some twinges in rt foot. Reports having a sore on the bottom of the foot but it looks the same as it did in the hospital. Agrees to monitor closely over the weekend. Pt reports having a doctor appointment on Monday to address.    Currently in Pain? No/denies                         [facility] Adult PT Treatment/Exercise - 12/24/15 0001    Knee/Hip Exercises: Stretches   Other Knee/Hip Stretches prone hip extension stretch, propping into stretch   Knee/Hip Exercises: Supine   Hip Adduction Isometric  Limitations ball squeeze   Bridges Limitations bilateral with bolster under posterior thighs 2X10   Straight Leg Raises Both;2 sets;10 reps;Other (comment)   Straight Leg Raises Limitations manual resistance   Knee/Hip Exercises: Sidelying   Hip ABduction Strengthening;Left;2 sets;10 reps   Hip ABduction Limitations manual resistance   Knee/Hip Exercises: Prone   Other Prone Exercises triped- alternating arm raise, manual assist needed when lifting Lt UE. 2 sets x 3 reps each.                 PT Education - 12/24/15 1513    Education provided Yes   Education Details discussed monitoring Rt foot and minimizing standing and ambulation until checked on Monday. To contact physician if noticing any changes.    Person(s) Educated Patient;Spouse   Methods Explanation   Comprehension Verbalized understanding          PT Short Term Goals - 12/17/15 0848    PT SHORT TERM GOAL #1   Title Pt to be able to [name] prothesis independently   Period Weeks   Status Not Met   PT SHORT TERM GOAL #2   Title Pt to be able to ambulate 800 ft with least assistive device with prothesis in 6 minutes to attend church and community activiies    Time 2   Period Weeks   Status Not Met   PT SHORT TERM GOAL #3   Title Pt gluteal maximus mm to be at least 4/5 to allow pt to come from sit to stand with no UE assist to be able to sit in chairs without arms.    Time 2   Period Weeks   Status On-going           PT Long Term Goals - 12/17/15 0848    PT LONG TERM GOAL #1   Title Pt to be able to tolerate standing with no upper extremity support for 10 minutes to be able to sing in the choir again    Time 4   Period Weeks   Status On-going   PT LONG TERM GOAL #2   Title Pt to be able to ambulate with his prothesis with a cane or least assisve device  for short distances to be able to have one hand free to carry coffee; or glass of water    Time 6   Period Weeks   Status Not Met   PT LONG  TERM GOAL #3   Title Pt to be able to ambulate with prothesis on uneven surfaces for 1000 feet with walker to  allow pt to walk over uneven ground to go to grandson's baseball games    Time 8   Period Weeks   Status Not Met   PT LONG TERM GOAL #4   Title Pt to be able to go up and down 4 steps with upper extremity suppport on railings to be able to exit buildings in case of emergency   Time 8   Period Weeks   Status Not Met               Plan - 12/24/15 1514    Clinical Impression Statement Patient progressing with strength in bilateral LEs. Session including bilateral LE for anticipation of progression to ambulation when prosthesis ready. Patient reports that he has an appointment with the orthotist on Tuesday. Patient reporting that he has had some intermittant pain in the right foot today and he has a prior sore. He states that it has not changed in appearance. Encouraged him to minimaze weightbearing on Rt until appointment on monday with his physician. Will continue with skilled PT.    PT Next Visit Plan Continue with strengthening focus through LLE. Incorporate core stabilization exercises as well. Awaiting prosthesis.    PT Home Exercise Plan modify HEP as needed.    Consulted and Agree with Plan of Care Patient        Problem List Patient Active Problem List   Diagnosis Date Noted  . Weight gain 11/30/2015  . Coronary artery disease involving coronary bypass graft of native heart without angina pectoris   . Tachycardia   . Leukocytosis   . Abnormality of gait   . Status post above knee amputation of left lower extremity ([place])   . Acute blood loss anemia   . Acute pulmonary edema (HCC)   . Acute congestive heart failure ([place])   . Acute respiratory failure with hypoxia ([place])   . Pressure ulcer 11/09/2015  . Necrotic toes ([place]) 11/08/2015  . Lower extremity cellulitis 11/08/2015  . Bradycardia 05/28/2015  . PVD (peripheral vascular disease) with claudication ([place])  09/30/2014  . Encounter for therapeutic drug monitoring 11/26/2013  . Peripheral vascular disease ([place]) 08/06/2012  . Chronic anticoagulation 01/20/2011  . Diabetes mellitus ([place]) 03/04/2010  . Dyslipidemia 03/04/2010  . Essential hypertension 03/04/2010  . Hx of CABG 03/04/2010  . Permanent atrial fibrillation ([place]) 04/08/2009    Cassell Clement, PT, CSCS Pager [phone]  12/24/2015, 3:19 PM  [facility] [address] [place], [name], [address] Phone: [phone]   Fax:  [phone]  Name: [name] MRN: [id number] Date of Birth: 06-04-1936

## 2015-12-24 NOTE — Telephone Encounter (Signed)
Phone cal from pt.  Reported onset this AM of intermittent "shooting" pain in right foot; in the ball of the foot.  Denied any redness, warmth, swelling, numbness, or tingling.  Stated the pain doesn't last long. Reported he is very aware of any changes, since the left AKA.  Reported he wanted to make Dr. Scot Dock aware.  Reported he has an appt. with his Podiatrist on 3/6, and will talk to him about the pain, also.  Advised pt. will make Dr. Scot Dock aware, and will call back with any recommendations.

## 2015-12-27 ENCOUNTER — Ambulatory Visit (HOSPITAL_COMMUNITY): Payer: Medicare Other | Admitting: Physical Therapy

## 2015-12-27 DIAGNOSIS — R269 Unspecified abnormalities of gait and mobility: Secondary | ICD-10-CM

## 2015-12-27 DIAGNOSIS — R29898 Other symptoms and signs involving the musculoskeletal system: Secondary | ICD-10-CM

## 2015-12-27 DIAGNOSIS — S78112A Complete traumatic amputation at level between left hip and knee, initial encounter: Secondary | ICD-10-CM

## 2015-12-27 NOTE — Therapy (Signed)
[name], [name], [address] Phone: [phone]   Fax:  [phone]  Physical Therapy Treatment  Patient Details  Name: [name] MRN: [id number] Date of Birth: 05-29-1936 Referring Provider: Dr. Linton [name]  Encounter Date: 12/27/2015      PT End of Session - 12/27/15 1518    Visit Number 6   Number of Visits 24   Date for PT Re-Evaluation 01/14/16   Authorization Type medicare   Authorization Time Period g-code by 9th visit, KX by 15th visit   Authorization - Visit Number 6   Authorization - Number of Visits 10   PT Start Time 1435   PT Stop Time 1513   PT Time Calculation (min) 38 min   Activity Tolerance Patient tolerated treatment well   Behavior During Therapy [facility] for tasks assessed/performed      Past Medical History  Diagnosis Date  . Arteriosclerotic cardiovascular disease (ASCVD)     CABG in 1990s; 2002 total obstruction of LAD, CX and RCA with patent grafts and nl EF; Stress nuc. 2008 - mild LV dilation; normal EF; questionable small anteroapical scar; no ischemia  . Atrial fibrillation ([place])   . Chronic anticoagulation   . Arthritis     "left knee" (11/08/2015)  . Pedal edema     "not a lot", per pt.  . Diabetic peripheral neuropathy (HCC)     bilateral lower leg, hands  . Hypertension     states under control with meds., has been on med. x "long time"  . Peripheral vascular disease ([place])   . 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 Dental crowns present   . Runny nose 07/27/2015    clear drainage, per pt.  . Myocardial infarction ([place]) 1990s    "after my jaw OR"  . Type II diabetes mellitus ([place])     "diet controlled" (11/08/2015)  . Iron deficiency anemia   . History of blood transfusion     "related to my leg surgery?"  . Cellulitis 11/08/2015    "both feet"  . Jaw cancer ([place]) 1990s    "squamous cell"    Past Surgical History  Procedure  Laterality Date  . Squamous cell carcinoma excision Left 1993    "took my jaw out; got cadavar in there now"  . Tonsillectomy    . Orif tibia fracture Left ~ 1970  . Colonoscopy N/A 11/27/2014    Procedure: COLONOSCOPY;  Surgeon: [name] Houston, MD;  [facility];  Service: Endoscopy;  Laterality: N/A;  830  . Incision and drainage abscess Left 06/16/2005    wide exc. abscess 5th toe  . Mandibular hardware removal Left 08/02/2015    Procedure:  HARDWARE REMOVAL TWO MANDIBULAR SCREWS;  Surgeon: [name], DDS;  Location: [facility];  Service: Oral Surgery;  Laterality: Left;  . Fracture surgery    . Cataract extraction w/ intraocular lens  implant, bilateral Bilateral 09/2015  . Cardiac catheterization  1990s; 04/16/2001  . Coronary artery bypass graft  1990's    "CABG X4"  . I&d extremity Left 11/10/2015    Procedure: INCISION AND DRAINAGE LEFT FOOT, AMPUTATION OF LEFT THIRD TOE;  Surgeon: [name] [place] Ranchos, MD;  Location: [place] [facility] [place];  Service: Vascular;  Laterality: Left;  . Amputation Left 11/11/2015    Procedure: LEFT ABOVE KNEE AMPUTATION;  Surgeon: [name] Mould, MD;  Location: [facility];  Service: Vascular;  Laterality: Left;    There were no vitals filed for this visit.  Visit Diagnosis:  Abnormality of gait  Above knee amputation of left lower extremity (HCC)  Bilateral leg weakness      Subjective Assessment - 12/27/15 1439    Subjective Patient reports he has been having some pretty intense; went to foot MD this morning and it was trimmed up. Prosthetics person is coming tomorrow.    Pertinent History atrial fibullation, HTN, OA, PVD, hx of skin cancer ,   Currently in Pain? Yes   Pain Score 8    Pain Location Leg   Pain Orientation Left   Pain Descriptors / Indicators Sharp   Pain Type Acute pain   Pain Onset More than a month ago   Pain Frequency Intermittent   Aggravating Factors  certain movements    Pain Relieving Factors nothing makes it  better, has to go away on its own    Effect of Pain on Daily Activities hasn't stopped him from doing anything yet                          [facility] Adult PT Treatment/Exercise - 12/27/15 0001    Knee/Hip Exercises: Seated   Other Seated Knee/Hip Exercises seated at edge of mat table with LE off of floor: lateral trunk flexion, cone rotations. Also ball toss on air pad but this was very easy for patient so termianted activity today.    Knee/Hip Exercises: Supine   Bridges Limitations 2x10    Straight Leg Raises Both;2 sets;10 reps;Other (comment)   Straight Leg Raises Limitations manual resistance    Other Supine Knee/Hip Exercises Glute sets x 1 set of 15 reps with 5 sec hold    Other Supine Knee/Hip Exercises core sets 1x15 with 3 second hold    Knee/Hip Exercises: Sidelying   Hip ABduction Both;1 set;15 reps   Hip ABduction Limitations manual resistance    Knee/Hip Exercises: Prone   Hip Extension Both;2 sets;10 reps   Hip Extension Limitations manual resistance    Other Prone Exercises triped- alternating arm raise, manual assist needed when lifting Lt UE. 2 sets x 5 reps each.                 PT Education - 12/27/15 1517    Education provided No          PT Short Term Goals - 12/17/15 0848    PT SHORT TERM GOAL #1   Title Pt to be able to [name] and [name] prothesis independently   Period Weeks   Status Not Met   PT SHORT TERM GOAL #2   Title Pt to be able to ambulate 800 ft with least assistive device with prothesis in 6 minutes to attend church and community activiies    Time 2   Period Weeks   Status Not Met   PT SHORT TERM GOAL #3   Title Pt gluteal maximus mm to be at least 4/5 to allow pt to come from sit to stand with no UE assist to be able to sit in chairs without arms.    Time 2   Period Weeks   Status On-going           PT Long Term Goals - 12/17/15 0848    PT LONG TERM GOAL #1   Title Pt to be able to tolerate standing with no  upper extremity support for 10 minutes to be able  to sing in the choir again    Time 4   Period Weeks   Status On-going   PT LONG TERM GOAL #2   Title Pt to be able to ambulate with his prothesis with a cane or least assisve device  for short distances to be able to have one hand free to carry coffee; or glass of water    Time 6   Period Weeks   Status Not Met   PT LONG TERM GOAL #3   Title Pt to be able to ambulate with prothesis on uneven surfaces for 1000 feet with walker to allow pt to walk over uneven ground to go to grandson's baseball games    Time 8   Period Weeks   Status Not Met   PT LONG TERM GOAL #4   Title Pt to be able to go up and down 4 steps with upper extremity suppport on railings to be able to exit buildings in case of emergency   Time 8   Period Weeks   Status Not Met               Plan - 12/27/15 1519    Clinical Impression Statement Continued focus on fucntional strength and core activation today; patient reports that the prosthetist is coming to his home tomorrow so hopefully he should have device soon. Continues to have phantom limb pain and reports that his foot doctor trimmed down the sore sport on his foot, just watching and waiting at this point. Introduced more aggressive mesaures towards core strength with some difficulty today except for with ball toss during which patient was able to compensate with UE lenght.    Pt will benefit from skilled therapeutic intervention in order to improve on the following deficits Abnormal gait;Decreased activity tolerance;Decreased balance;Decreased knowledge of use of DME;Decreased mobility;Decreased strength;Difficulty walking;Prosthetic Dependency   Rehab Potential Good   PT Frequency 3x / week   PT Duration 8 weeks   PT Treatment/Interventions ADLs/Self Care Home Management;Therapeutic activities;Therapeutic exercise;Balance training;Patient/family education;Prosthetic Training   PT Next Visit Plan Continue with  strengthening focus through LLE. Incorporate core stabilization exercises as well. Awaiting prosthesis.    PT Home Exercise Plan modify HEP as needed.    Consulted and Agree with Plan of Care Patient        Problem List Patient Active Problem List   Diagnosis Date Noted  . Weight gain 11/30/2015  . Coronary artery disease involving coronary bypass graft of native heart without angina pectoris   . Tachycardia   . Leukocytosis   . Abnormality of gait   . Status post above knee amputation of left lower extremity ([place])   . Acute blood loss anemia   . Acute pulmonary edema (HCC)   . Acute congestive heart failure ([place])   . Acute respiratory failure with hypoxia ([place])   . Pressure ulcer 11/09/2015  . Necrotic toes ([place]) 11/08/2015  . Lower extremity cellulitis 11/08/2015  . Bradycardia 05/28/2015  . PVD (peripheral vascular disease) with claudication ([place]) 09/30/2014  . Encounter for therapeutic drug monitoring 11/26/2013  . Peripheral vascular disease ([place]) 08/06/2012  . Chronic anticoagulation 01/20/2011  . Diabetes mellitus ([place]) 03/04/2010  . Dyslipidemia 03/04/2010  . Essential hypertension 03/04/2010  . Hx of CABG 03/04/2010  . Permanent atrial fibrillation ([place]) 04/08/2009    Deniece Ree PT, DPT [facility] [address] [place], [name], [address] Phone: [phone]   Fax:  [phone]  Name:  [name] MRN: [id number] Date of Birth: Jan 12, 1936

## 2015-12-28 NOTE — Telephone Encounter (Signed)
RE: Juluis Rainier   Received: Today    Angelia Mould, MD  Denman George, RN           If still having pain, I could see him Wednesday.

## 2015-12-29 ENCOUNTER — Ambulatory Visit (HOSPITAL_COMMUNITY): Payer: Medicare Other | Admitting: Physical Therapy

## 2015-12-29 DIAGNOSIS — R269 Unspecified abnormalities of gait and mobility: Secondary | ICD-10-CM

## 2015-12-29 DIAGNOSIS — S78112A Complete traumatic amputation at level between left hip and knee, initial encounter: Secondary | ICD-10-CM

## 2015-12-29 DIAGNOSIS — R29898 Other symptoms and signs involving the musculoskeletal system: Secondary | ICD-10-CM

## 2015-12-29 NOTE — Therapy (Signed)
[place], [name], [address] Phone: [phone]   Fax:  [phone]  Physical Therapy Treatment  Patient Details  Name: [name] MRN: [id number] Date of Birth: November 24, 1935 Referring Provider: Dr. Linton Ham  Encounter Date: 12/29/2015      PT End of Session - 12/29/15 1510    Visit Number 7   Number of Visits 24   Date for PT Re-Evaluation 01/14/16   Authorization Type medicare   Authorization Time Period g-code by 9th visit, KX by 15th visit   Authorization - Visit Number 7   Authorization - Number of Visits 10   PT Start Time 1430   PT Stop Time 1515   PT Time Calculation (min) 45 min   Equipment Utilized During Treatment Gait belt   Activity Tolerance Patient tolerated treatment well      Past Medical History  Diagnosis Date  . Arteriosclerotic cardiovascular disease (ASCVD)     CABG in 1990s; 2002 total obstruction of LAD, CX and RCA with patent grafts and nl EF; Stress nuc. 2008 - mild LV dilation; normal EF; questionable small anteroapical scar; no ischemia  . Atrial fibrillation ([place])   . Chronic anticoagulation   . Arthritis     "left knee" (11/08/2015)  . Pedal edema     "not a lot", per pt.  . Diabetic peripheral neuropathy (HCC)     bilateral lower leg, hands  . Hypertension     states under control with meds., has been on med. x "long time"  . Peripheral vascular disease ([place])   . 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 Dental crowns present   . Runny nose 07/27/2015    clear drainage, per pt.  . Myocardial infarction ([place]) 1990s    "after my jaw OR"  . Type II diabetes mellitus ([place])     "diet controlled" (11/08/2015)  . Iron deficiency anemia   . History of blood transfusion     "related to my leg surgery?"  . Cellulitis 11/08/2015    "both feet"  . Jaw cancer ([place]) 1990s    "squamous cell"    Past Surgical History  Procedure Laterality Date   . Squamous cell carcinoma excision Left 1993    "took my jaw out; got cadavar in there now"  . Tonsillectomy    . Orif tibia fracture Left ~ 1970  . Colonoscopy N/A 11/27/2014    Procedure: COLONOSCOPY;  Surgeon: Rogene Houston, MD;  [facility];  Service: Endoscopy;  Laterality: N/A;  830  . Incision and drainage abscess Left 06/16/2005    wide exc. abscess 5th toe  . Mandibular hardware removal Left 08/02/2015    Procedure:  HARDWARE REMOVAL TWO MANDIBULAR SCREWS;  Surgeon: Jannette Fogo, DDS;  Location: [facility];  Service: Oral Surgery;  Laterality: Left;  . Fracture surgery    . Cataract extraction w/ intraocular lens  implant, bilateral Bilateral 09/2015  . Cardiac catheterization  1990s; 04/16/2001  . Coronary artery bypass graft  1990's    "CABG X4"  . I&d extremity Left 11/10/2015    Procedure: INCISION AND DRAINAGE LEFT FOOT, AMPUTATION OF LEFT THIRD TOE;  Surgeon: Conrad Mayer, MD;  Location: [facility];  Service: Vascular;  Laterality: Left;  . Amputation Left 11/11/2015    Procedure: LEFT ABOVE KNEE AMPUTATION;  Surgeon: Angelia Mould, MD;  Location: [facility];  Service: Vascular;  Laterality:  Left;    There were no vitals filed for this visit.  Visit Diagnosis:  Abnormality of gait  Above knee amputation of left lower extremity (HCC)  Bilateral leg weakness      Subjective Assessment - 12/29/15 1424    Subjective Pt states that he has not had any phantom pain today.  Pt fell yesterday,(he was asleep and the phone rang, he had forgotten about his leg and went to answer the phone     Pertinent History atrial fibullation, HTN, OA, PVD, hx of skin cancer ,   How long can you sit comfortably? no problem   How long can you walk comfortably? currently does not have his prothesis but is able to walk with his walker for 225 feet.    Patient Stated Goals To be able to walk with his prothesis.    Currently in Pain? No/denies   Pain Score 0-No pain                          [facility] Adult PT Treatment/Exercise - 12/29/15 0001    Ambulation/Gait   Ambulation/Gait Yes   Ambulation/Gait Assistance 6: Modified independent (Device/Increase time)   Ambulation Distance (Feet) 226 Feet   Assistive device Rolling walker   Lumbar Exercises: Stretches   Active Hamstring Stretch 3 reps;30 seconds   Single Knee to Chest Stretch 3 reps;30 seconds   Prone on Elbows Stretch 5 reps;20 seconds  repeat x 2   Press Ups 10 seconds   Lumbar Exercises: Standing   Other Standing Lumbar Exercises SLS x 5   Right    Lumbar Exercises: Seated   Sit to Stand 10 reps   Sit to Stand Limitations no walker    Lumbar Exercises: Supine   Bridge 15 reps   Bridge Limitations Rt LE only    Large Ball Abdominal Isometric 15 reps   Large Ball Oblique Isometric 15 reps   Other Supine Lumbar Exercises opposite arm to leg x 15 both    Lumbar Exercises: Sidelying   Hip Abduction 15 reps   Hip Abduction Weights (lbs) 10# Lt; 5#  RT    Lumbar Exercises: Prone   Opposite Arm/Leg Raise Right arm/Left leg;Left arm/Right leg;10 reps   Other Prone Lumbar Exercises shoulder extension; w-back x 15 each   3# in each hand    Lumbar Exercises: Quadruped   Opposite Arm/Leg Raise Right arm/Left leg;10 reps   Opposite Arm/Leg Raise Limitations tripod    Knee/Hip Exercises: Stretches   Other Knee/Hip Stretches prone hip extension stretch, propping into stretch                  PT Short Term Goals - 12/17/15 0848    PT SHORT TERM GOAL #1   Title Pt to be able to [name] and [name] independently   Period Weeks   Status Not Met   PT SHORT TERM GOAL #2   Title Pt to be able to ambulate 800 ft with least assistive device with prothesis in 6 minutes to attend church and community activiies    Time 2   Period Weeks   Status Not Met   PT SHORT TERM GOAL #3   Title Pt gluteal maximus mm to be at least 4/5 to allow pt to come from sit to stand with no UE  assist to be able to sit in chairs without arms.    Time 2   Period Weeks   Status  On-going           PT Long Term Goals - 12/17/15 0848    PT LONG TERM GOAL #1   Title Pt to be able to tolerate standing with no upper extremity support for 10 minutes to be able to sing in the choir again    Time 4   Period Weeks   Status On-going   PT LONG TERM GOAL #2   Title Pt to be able to ambulate with his prothesis with a cane or least assisve device  for short distances to be able to have one hand free to carry coffee; or glass of water    Time 6   Period Weeks   Status Not Met   PT LONG TERM GOAL #3   Title Pt to be able to ambulate with prothesis on uneven surfaces for 1000 feet with walker to allow pt to walk over uneven ground to go to grandson's baseball games    Time 8   Period Weeks   Status Not Met   PT LONG TERM GOAL #4   Title Pt to be able to go up and down 4 steps with upper extremity suppport on railings to be able to exit buildings in case of emergency   Time 8   Period Weeks   Status Not Met               Plan - 12/29/15 1511    Clinical Impression Statement Added ball abdominal and supine opposite arm leg touch for improved core strength.  Pt had difficulty with obliques. Noted tightness in Rt hamstring added stretch.  Pt does not have prothesis yet.  Therapist and patient agreed to decrease to once a week until pt recieves prothesis then increase to twice a week.     Pt will benefit from skilled therapeutic intervention in order to improve on the following deficits Abnormal gait;Decreased activity tolerance;Decreased balance;Decreased knowledge of use of DME;Decreased mobility;Decreased strength;Difficulty walking;Prosthetic Dependency   PT Frequency 3x / week   PT Duration 8 weeks   PT Next Visit Plan Continue with strengthening focus through LLE. Incorporate core stabilization exercises as well. Awaiting prosthesis.         Problem List Patient Active  Problem List   Diagnosis Date Noted  . Weight gain 11/30/2015  . Coronary artery disease involving coronary bypass graft of native heart without angina pectoris   . Tachycardia   . Leukocytosis   . Abnormality of gait   . Status post above knee amputation of left lower extremity ([place])   . Acute blood loss anemia   . Acute pulmonary edema (HCC)   . Acute congestive heart failure ([place])   . Acute respiratory failure with hypoxia ([place])   . Pressure ulcer 11/09/2015  . Necrotic toes ([place]) 11/08/2015  . Lower extremity cellulitis 11/08/2015  . Bradycardia 05/28/2015  . PVD (peripheral vascular disease) with claudication ([place]) 09/30/2014  . Encounter for therapeutic drug monitoring 11/26/2013  . Peripheral vascular disease ([place]) 08/06/2012  . Chronic anticoagulation 01/20/2011  . Diabetes mellitus ([place]) 03/04/2010  . Dyslipidemia 03/04/2010  . Essential hypertension 03/04/2010  . Hx of CABG 03/04/2010  . Permanent atrial fibrillation [facility]) 04/08/2009    Rayetta Humphrey, PT CLT [phone] 12/29/2015, 3:19 PM  [facility] [address] [place], [name], [address] Phone: [phone]   Fax:  [phone]  Name: [name] MRN: [id number] Date of Birth: 1936/07/18

## 2015-12-29 NOTE — Telephone Encounter (Signed)
When I called to speak with patient, he said that everything had cleared up and he didn't feel that he needed to come in. I asked that he call back if anything changes. dpm

## 2015-12-31 ENCOUNTER — Ambulatory Visit (HOSPITAL_COMMUNITY): Payer: Medicare Other | Admitting: Physical Therapy

## 2016-01-03 ENCOUNTER — Ambulatory Visit (HOSPITAL_COMMUNITY): Payer: Medicare Other | Admitting: Physical Therapy

## 2016-01-03 ENCOUNTER — Telehealth (HOSPITAL_COMMUNITY): Payer: Self-pay | Admitting: Physical Therapy

## 2016-01-03 NOTE — Telephone Encounter (Signed)
Still waiting on his leg to come in. Nf

## 2016-01-05 ENCOUNTER — Ambulatory Visit (HOSPITAL_COMMUNITY): Payer: Medicare Other | Admitting: Physical Therapy

## 2016-01-05 DIAGNOSIS — S78112A Complete traumatic amputation at level between left hip and knee, initial encounter: Secondary | ICD-10-CM

## 2016-01-05 DIAGNOSIS — R269 Unspecified abnormalities of gait and mobility: Secondary | ICD-10-CM | POA: Diagnosis not present

## 2016-01-05 DIAGNOSIS — R29898 Other symptoms and signs involving the musculoskeletal system: Secondary | ICD-10-CM

## 2016-01-07 ENCOUNTER — Ambulatory Visit (HOSPITAL_COMMUNITY): Payer: Medicare Other | Admitting: Physical Therapy

## 2016-01-07 NOTE — Therapy (Signed)
[place], [name], [address] Phone: [phone]   Fax:  [phone]  Physical Therapy Treatment  Patient Details  Name: [name] MRN: [id number] Date of Birth: 1936-10-18 Referring Provider: Dr. Linton Ham  Encounter Date: 01/05/2016      PT End of Session - 01/07/16 1254    Visit Number 8   Number of Visits 24   Date for PT Re-Evaluation 01/14/16   Authorization Type medicare   Authorization Time Period g-code by 9th visit, KX by 15th visit   Authorization - Visit Number 8   Authorization - Number of Visits 10   PT Start Time 3559   PT Stop Time 1522   PT Time Calculation (min) 51 min   Equipment Utilized During Treatment Gait belt   Activity Tolerance Patient tolerated treatment well      Past Medical History  Diagnosis Date  . Arteriosclerotic cardiovascular disease (ASCVD)     CABG in 1990s; 2002 total obstruction of LAD, CX and RCA with patent grafts and nl EF; Stress nuc. 2008 - mild LV dilation; normal EF; questionable small anteroapical scar; no ischemia  . Atrial fibrillation ([place])   . Chronic anticoagulation   . Arthritis     "left knee" (11/08/2015)  . Pedal edema     "not a lot", per pt.  . Diabetic peripheral neuropathy (HCC)     bilateral lower leg, hands  . Hypertension     states under control with meds., has been on med. x "long time"  . Peripheral vascular disease ([place])   . 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 Dental crowns present   . Runny nose 07/27/2015    clear drainage, per pt.  . Myocardial infarction ([place]) 1990s    "after my jaw OR"  . Type II diabetes mellitus ([place])     "diet controlled" (11/08/2015)  . Iron deficiency anemia   . History of blood transfusion     "related to my leg surgery?"  . Cellulitis 11/08/2015    "both feet"  . Jaw cancer ([place]) 1990s    "squamous cell"    Past Surgical History  Procedure Laterality Date   . Squamous cell carcinoma excision Left 1993    "took my jaw out; got cadavar in there now"  . Tonsillectomy    . Orif tibia fracture Left ~ 1970  . Colonoscopy N/A 11/27/2014    Procedure: COLONOSCOPY;  Surgeon: Rogene Houston, MD;  [facility];  Service: Endoscopy;  Laterality: N/A;  830  . Incision and drainage abscess Left 06/16/2005    wide exc. abscess 5th toe  . Mandibular hardware removal Left 08/02/2015    Procedure:  HARDWARE REMOVAL TWO MANDIBULAR SCREWS;  Surgeon: Jannette Fogo, DDS;  Location: [facility];  Service: Oral Surgery;  Laterality: Left;  . Fracture surgery    . Cataract extraction w/ intraocular lens  implant, bilateral Bilateral 09/2015  . Cardiac catheterization  1990s; 04/16/2001  . Coronary artery bypass graft  1990's    "CABG X4"  . I&d extremity Left 11/10/2015    Procedure: INCISION AND DRAINAGE LEFT FOOT, AMPUTATION OF LEFT THIRD TOE;  Surgeon: [place], MD;  Location: [facility];  Service: Vascular;  Laterality: Left;  . Amputation Left 11/11/2015    Procedure: LEFT ABOVE KNEE AMPUTATION;  Surgeon: Angelia Mould, MD;  Location: [facility];  Service: Vascular;  Laterality:  Left;    There were no vitals filed for this visit.  Visit Diagnosis:  Abnormality of gait  Above knee amputation of left lower extremity (HCC)  Bilateral leg weakness      Subjective Assessment - 01/07/16 1254    Subjective Pt recieved his prothesis yesterday.  He comes with it donned but states that he feels that it will need to be adjusted; the orthotist comes back next Tuesday.    Pertinent History atrial fibullation, HTN, OA, PVD, hx of skin cancer ,   How long can you sit comfortably? no problem   How long can you walk comfortably? currently does not have his prothesis but is able to walk with his walker for 225 feet.    Patient Stated Goals To be able to walk with his prothesis.    Currently in Pain? No/denies           01/05/16 0001   Ambulation/Gait  Ambulation/Gait Yes  Ambulation/Gait Assistance 6: Modified independent (Device/Increase time)  Ambulation/Gait Assistance Details with prothesis  Ambulation Distance (Feet) 180 Feet (x2)  Assistive device Rolling walker  Lumbar Exercises: Standing  Other Standing Lumbar Exercises standing to UE assist;   Other Standing Lumbar Exercises weight shfting RT/LT and forward back ; stand no hands turn head   Shoulder Adduction Limitations standing B UE flexion; punch outs x 10 reps each;  stand with walker taking Rt leg then Lt leg out x 10 reps eachl                  PT Short Term Goals - 12/17/15 0848    PT SHORT TERM GOAL #1   Title Pt to be able to [name] and [name] independently   Period Weeks   Status Not Met   PT SHORT TERM GOAL #2   Title Pt to be able to ambulate 800 ft with least assistive device with prothesis in 6 minutes to attend church and community activiies    Time 2   Period Weeks   Status Not Met   PT SHORT TERM GOAL #3   Title Pt gluteal maximus mm to be at least 4/5 to allow pt to come from sit to stand with no UE assist to be able to sit in chairs without arms.    Time 2   Period Weeks   Status On-going           PT Long Term Goals - 12/17/15 0848    PT LONG TERM GOAL #1   Title Pt to be able to tolerate standing with no upper extremity support for 10 minutes to be able to sing in the choir again    Time 4   Period Weeks   Status On-going   PT LONG TERM GOAL #2   Title Pt to be able to ambulate with his prothesis with a cane or least assisve device  for short distances to be able to have one hand free to carry coffee; or glass of water    Time 6   Period Weeks   Status Not Met   PT LONG TERM GOAL #3   Title Pt to be able to ambulate with prothesis on uneven surfaces for 1000 feet with walker to allow pt to walk over uneven ground to go to grandson's baseball games    Time 8   Period Weeks   Status Not Met   PT LONG  TERM GOAL #4   Title Pt to be able to  go up and down 4 steps with upper extremity suppport on railings to be able to exit buildings in case of emergency   Time 8   Period Weeks   Status Not Met      Assessment:   Pt ambulated in department with prothesis on .  Pt states that he recieved his prothesis yesterday but it is hitting to high in the groin area.  Therapixt urged pt to contact othotist.  Pt having some difficulty advancing and locking prothesis with ambulation but improves with practice.  Plan:  Continue with prosthetic and balance training.     Problem List Patient Active Problem List   Diagnosis Date Noted  . Weight gain 11/30/2015  . Coronary artery disease involving coronary bypass graft of native heart without angina pectoris   . Tachycardia   . Leukocytosis   . Abnormality of gait   . Status post above knee amputation of left lower extremity ([employer])   . Acute blood loss anemia   . Acute pulmonary edema (HCC)   . Acute congestive heart failure ([place])   . Acute respiratory failure with hypoxia ([place])   . Pressure ulcer 11/09/2015  . Necrotic toes ([place]) 11/08/2015  . Lower extremity cellulitis 11/08/2015  . Bradycardia 05/28/2015  . PVD (peripheral vascular disease) with claudication ([place]) 09/30/2014  . Encounter for therapeutic drug monitoring 11/26/2013  . Peripheral vascular disease ([place]) 08/06/2012  . Chronic anticoagulation 01/20/2011  . Diabetes mellitus ([place]) 03/04/2010  . Dyslipidemia 03/04/2010  . Essential hypertension 03/04/2010  . Hx of CABG 03/04/2010  . Permanent atrial fibrillation [facility]) 04/08/2009    Rayetta Humphrey, [place] CLT [phone] 01/07/2016, 12:55 PM  [facility] [address] [place], [name], [address] Phone: [phone]   Fax:  [phone]  Name: JAYTHAN HINELY MRN: [id number] Date of Birth: Jul 09, 1936

## 2016-01-10 ENCOUNTER — Ambulatory Visit (HOSPITAL_COMMUNITY): Payer: Medicare Other | Admitting: Physical Therapy

## 2016-01-10 ENCOUNTER — Telehealth (HOSPITAL_COMMUNITY): Payer: Self-pay

## 2016-01-10 NOTE — Telephone Encounter (Signed)
Patient can be d/c'ed from OT ,MD told her she have arthritis and OT will not help it.

## 2016-01-12 ENCOUNTER — Ambulatory Visit (HOSPITAL_COMMUNITY): Payer: Medicare Other | Admitting: Physical Therapy

## 2016-01-12 ENCOUNTER — Ambulatory Visit (INDEPENDENT_AMBULATORY_CARE_PROVIDER_SITE_OTHER): Payer: Medicare Other | Admitting: *Deleted

## 2016-01-12 DIAGNOSIS — I4821 Permanent atrial fibrillation: Secondary | ICD-10-CM

## 2016-01-12 DIAGNOSIS — R269 Unspecified abnormalities of gait and mobility: Secondary | ICD-10-CM

## 2016-01-12 DIAGNOSIS — I482 Chronic atrial fibrillation: Secondary | ICD-10-CM

## 2016-01-12 DIAGNOSIS — Z5181 Encounter for therapeutic drug level monitoring: Secondary | ICD-10-CM | POA: Diagnosis not present

## 2016-01-12 DIAGNOSIS — S78112A Complete traumatic amputation at level between left hip and knee, initial encounter: Secondary | ICD-10-CM

## 2016-01-12 DIAGNOSIS — R29898 Other symptoms and signs involving the musculoskeletal system: Secondary | ICD-10-CM

## 2016-01-12 LAB — POCT INR: INR: 2.6

## 2016-01-12 NOTE — Therapy (Signed)
[place], [name], [address] Phone: [phone]   Fax:  [phone]  Physical Therapy Treatment  Patient Details  Name: [name] MRN: [id number] Date of Birth: [birth year] Referring Provider: Dr. Linton Ham  Encounter Date: 01/12/2016      PT End of Session - 01/12/16 1605    Visit Number 9   Number of Visits 24   Date for PT Re-Evaluation 01/14/16   Authorization Time Period g-code by 10th visit, KX by 15th visit   Authorization - Visit Number 9   Authorization - Number of Visits 10   PT Start Time 4268  Pt late for appointment    PT Stop Time 1520   PT Time Calculation (min) 38 min   Equipment Utilized During Treatment Gait belt   Activity Tolerance Patient tolerated treatment well   Behavior During Therapy [facility] for tasks assessed/performed      Past Medical History  Diagnosis Date  . Arteriosclerotic cardiovascular disease (ASCVD)     CABG in 1990s; 2002 total obstruction of LAD, CX and RCA with patent grafts and nl EF; Stress nuc. 2008 - mild LV dilation; normal EF; questionable small anteroapical scar; no ischemia  . Atrial fibrillation ([place])   . Chronic anticoagulation   . Arthritis     "left knee" (11/08/2015)  . Pedal edema     "not a lot", per pt.  . Diabetic peripheral neuropathy (HCC)     bilateral lower leg, hands  . Hypertension     states under control with meds., has been on med. x "long time"  . Peripheral vascular disease ([place])   . 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 Dental crowns present   . Runny nose 07/27/2015    clear drainage, per pt.  . Myocardial infarction ([place]) 1990s    "after my jaw OR"  . Type II diabetes mellitus ([place])     "diet controlled" (11/08/2015)  . Iron deficiency anemia   . History of blood transfusion     "related to my leg surgery?"  . Cellulitis 11/08/2015    "both feet"  . Jaw cancer ([place]) 1990s    "squamous cell"     Past Surgical History  Procedure Laterality Date  . Squamous cell carcinoma excision Left 1993    "took my jaw out; got cadavar in there now"  . Tonsillectomy    . Orif tibia fracture Left ~ 1970  . Colonoscopy N/A 11/27/2014    Procedure: COLONOSCOPY;  Surgeon: Rogene Houston, MD;  [facility];  Service: Endoscopy;  Laterality: N/A;  830  . Incision and drainage abscess Left 06/16/2005    wide exc. abscess 5th toe  . Mandibular hardware removal Left 08/02/2015    Procedure:  HARDWARE REMOVAL TWO MANDIBULAR SCREWS;  Surgeon: Jannette Fogo, DDS;  Location: [facility];  Service: Oral Surgery;  Laterality: Left;  . Fracture surgery    . Cataract extraction w/ intraocular lens  implant, bilateral Bilateral 09/2015  . Cardiac catheterization  1990s; 04/16/2001  . Coronary artery bypass graft  1990's    "CABG X4"  . I&d extremity Left 11/10/2015    Procedure: INCISION AND DRAINAGE LEFT FOOT, AMPUTATION OF LEFT THIRD TOE;  Surgeon: Conrad Lemont, MD;  Location: [place];  Service: Vascular;  Laterality: Left;  . Amputation Left 11/11/2015    Procedure: LEFT ABOVE KNEE AMPUTATION;  Surgeon: Angelia Mould,  MD;  Location: [place];  Service: Vascular;  Laterality: Left;    There were no vitals filed for this visit.  Visit Diagnosis:  Abnormality of gait  Above knee amputation of left lower extremity (HCC)  Bilateral leg weakness      Subjective Assessment - 01/12/16 1601    Subjective Pt did not come to last session due to the foot falling off of his prothesis.  Pt states he just got his prosthesis back today.    Pertinent History atrial fibullation, HTN, OA, PVD, hx of skin cancer ,   How long can you sit comfortably? no problem   How long can you walk comfortably? currently does not have his prothesis but is able to walk with his walker for 225 feet.    Patient Stated Goals To be able to walk with his prothesis.    Currently in Pain? No/denies                          [facility] Adult PT Treatment/Exercise - 01/12/16 0001    Ambulation/Gait   Ambulation/Gait Yes   Ambulation/Gait Assistance 6: Modified independent (Device/Increase time)   Ambulation/Gait Assistance Details with prothesis a   Ambulation Distance (Feet) 226 Feet  x2   Assistive device Rolling walker   Gait Comments verbal cuing to keep space in between feet, advance walker further and not to throw Lt LE forward.    Lumbar Exercises: Standing   Shoulder Adduction Limitations side stepping    Other Standing Lumbar Exercises standing to UE assist;    Other Standing Lumbar Exercises weight shfting RT/LT and forward back ; stand no hands turn head                 PT Education - 01/12/16 1605    Education provided Yes   Education Details proper gait with prothesis, walker height increased for proper fitting.    Person(s) Educated Patient;Spouse   Methods Explanation   Comprehension Verbalized understanding          PT Short Term Goals - 12/17/15 0848    PT SHORT TERM GOAL #1   Title Pt to be able to [name] and [name] independently   Period Weeks   Status Not Met   PT SHORT TERM GOAL #2   Title Pt to be able to ambulate 800 ft with least assistive device with prothesis in 6 minutes to attend church and community activiies    Time 2   Period Weeks   Status Not Met   PT SHORT TERM GOAL #3   Title Pt gluteal maximus mm to be at least 4/5 to allow pt to come from sit to stand with no UE assist to be able to sit in chairs without arms.    Time 2   Period Weeks   Status On-going           PT Long Term Goals - 12/17/15 0848    PT LONG TERM GOAL #1   Title Pt to be able to tolerate standing with no upper extremity support for 10 minutes to be able to sing in the choir again    Time 4   Period Weeks   Status On-going   PT LONG TERM GOAL #2   Title Pt to be able to ambulate with his prothesis with a cane or least assisve device   for short distances to be able to have one hand free to carry coffee;  or glass of water    Time 6   Period Weeks   Status Not Met   PT LONG TERM GOAL #3   Title Pt to be able to ambulate with prothesis on uneven surfaces for 1000 feet with walker to allow pt to walk over uneven ground to go to grandson's baseball games    Time 8   Period Weeks   Status Not Met   PT LONG TERM GOAL #4   Title Pt to be able to go up and down 4 steps with upper extremity suppport on railings to be able to exit buildings in case of emergency   Time 8   Period Weeks   Status Not Met               Plan - 01/12/16 1606    Clinical Impression Statement Pt walker adjusted for pt height.  Pt prothesis appears to be just slightly taller than it should be therapist advised pt to speak to prothestist at his next session.  Pt ambulating with better form but still needs verbal cuing for foot advancement, foot placement and walker placement.    Pt will benefit from skilled therapeutic intervention in order to improve on the following deficits Abnormal gait;Decreased activity tolerance;Decreased balance;Decreased knowledge of use of DME;Decreased mobility;Decreased strength;Difficulty walking;Prosthetic Dependency   Rehab Potential Good   PT Frequency 3x / week   PT Duration 8 weeks   PT Treatment/Interventions ADLs/Self Care Home Management;Therapeutic activities;Therapeutic exercise;Balance training;Patient/family education;Prosthetic Training   PT Next Visit Plan [place] with prosthetic and balance training.  Try ambulation in parallel bars using right hand only next treatment.    Consulted and Agree with Plan of Care Patient        Problem List Patient Active Problem List   Diagnosis Date Noted  . Weight gain 11/30/2015  . Coronary artery disease involving coronary bypass graft of native heart without angina pectoris   . Tachycardia   . Leukocytosis   . Abnormality of gait   . Status post above knee  amputation of left lower extremity ([place])   . Acute blood loss anemia   . Acute pulmonary edema (HCC)   . Acute congestive heart failure ([place])   . Acute respiratory failure with hypoxia ([place])   . Pressure ulcer 11/09/2015  . Necrotic toes ([place]) 11/08/2015  . Lower extremity cellulitis 11/08/2015  . Bradycardia 05/28/2015  . PVD (peripheral vascular disease) with claudication ([place]) 09/30/2014  . Encounter for therapeutic drug monitoring 11/26/2013  . Peripheral vascular disease ([place]) 08/06/2012  . Chronic anticoagulation 01/20/2011  . Diabetes mellitus ([place]) 03/04/2010  . Dyslipidemia 03/04/2010  . Essential hypertension 03/04/2010  . Hx of CABG 03/04/2010  . Permanent atrial fibrillation [facility]) 04/08/2009   Rayetta Humphrey, PT CLT [phone] 01/12/2016, 4:10 PM  [place] [address] [place], [name], [address] Phone: [phone]   Fax:  [phone]  Name: [name] MRN: [id number] Date of Birth: July 19, 1936

## 2016-01-14 ENCOUNTER — Ambulatory Visit (HOSPITAL_COMMUNITY): Payer: Medicare Other | Admitting: Physical Therapy

## 2016-01-14 DIAGNOSIS — R29898 Other symptoms and signs involving the musculoskeletal system: Secondary | ICD-10-CM

## 2016-01-14 DIAGNOSIS — R269 Unspecified abnormalities of gait and mobility: Secondary | ICD-10-CM

## 2016-01-14 DIAGNOSIS — S78112A Complete traumatic amputation at level between left hip and knee, initial encounter: Secondary | ICD-10-CM

## 2016-01-14 NOTE — Therapy (Signed)
[place], [name], [address] Phone: [phone]   Fax:  [phone]  Physical Therapy Treatment  Patient Details  Name: [name] MRN: [id number] Date of Birth: 1936/10/20 Referring Provider: Dr. Linton Ham  Encounter Date: 01/14/2016      PT End of Session - 01/14/16 1452    Visit Number 10   Number of Visits 24   Date for PT Re-Evaluation 02/13/16   Authorization Type medicare   Authorization Time Period g code by visit 11 ; KX by 15   Authorization - Visit Number 10   Authorization - Number of Visits 20   PT Start Time 0973   PT Stop Time 1520   PT Time Calculation (min) 44 min      Past Medical History  Diagnosis Date  . Arteriosclerotic cardiovascular disease (ASCVD)     CABG in 1990s; 2002 total obstruction of LAD, CX and RCA with patent grafts and nl EF; Stress nuc. 2008 - mild LV dilation; normal EF; questionable small anteroapical scar; no ischemia  . Atrial fibrillation ([place])   . Chronic anticoagulation   . Arthritis     "left knee" (11/08/2015)  . Pedal edema     "not a lot", per pt.  . Diabetic peripheral neuropathy (HCC)     bilateral lower leg, hands  . Hypertension     states under control with meds., has been on med. x "long time"  . Peripheral vascular disease ([place])   . 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 Dental crowns present   . Runny nose 07/27/2015    clear drainage, per pt.  . Myocardial infarction ([place]) 1990s    "after my jaw OR"  . Type II diabetes mellitus ([place])     "diet controlled" (11/08/2015)  . Iron deficiency anemia   . History of blood transfusion     "related to my leg surgery?"  . Cellulitis 11/08/2015    "both feet"  . Jaw cancer ([place]) 1990s    "squamous cell"    Past Surgical History  Procedure Laterality Date  . Squamous cell carcinoma excision Left 1993    "took my jaw out; got cadavar in there now"  .  Tonsillectomy    . Orif tibia fracture Left ~ 1970  . Colonoscopy N/A 11/27/2014    Procedure: COLONOSCOPY;  Surgeon: Rogene Houston, MD;  [facility];  Service: Endoscopy;  Laterality: N/A;  830  . Incision and drainage abscess Left 06/16/2005    wide exc. abscess 5th toe  . Mandibular hardware removal Left 08/02/2015    Procedure:  HARDWARE REMOVAL TWO MANDIBULAR SCREWS;  Surgeon: Jannette Fogo, DDS;  Location: [place];  Service: Oral Surgery;  Laterality: Left;  . Fracture surgery    . Cataract extraction w/ intraocular lens  implant, bilateral Bilateral 09/2015  . Cardiac catheterization  1990s; 04/16/2001  . Coronary artery bypass graft  1990's    "CABG X4"  . I&d extremity Left 11/10/2015    Procedure: INCISION AND DRAINAGE LEFT FOOT, AMPUTATION OF LEFT THIRD TOE;  Surgeon: Conrad [place], MD;  Location: [place];  Service: Vascular;  Laterality: Left;  . Amputation Left 11/11/2015    Procedure: LEFT ABOVE KNEE AMPUTATION;  Surgeon: Angelia Mould, MD;  Location: [place] [facility];  Service: Vascular;  Laterality: Left;    There were no vitals filed for this visit.  Visit Diagnosis:  Abnormality of gait  Bilateral leg weakness  Above knee amputation of left lower extremity (HCC)          [facility] PT Assessment - 01/14/16 0001    Assessment   Medical Diagnosis Lt AKA   Onset Date/Surgical Date 11/11/15   Hand Dominance Right   Next MD Visit no follow up    Prior Therapy SNF   Precautions   Precautions Fall   Restrictions   Weight Bearing Restrictions No   [place] residence   Home Access Ramped entrance   [place] One level   Prior Function   Level of [place] Retired   Leisure watch grandson play baseball    Cognition   Overall Cognitive Status Within Functional Limits for tasks assessed   Functional Tests   Functional tests Single leg stance;Sit to Stand   Single Leg Stance    Comments Rt 6 seconds was 3 seconds    Sit to Stand   Comments one UE assist 5 x 38.25 seconds was  46.23    AROM   Overall AROM  Within functional limits for tasks performed   Strength   Right Hip Flexion 5/5   Right Hip Extension 4-/5  was 3+/5    Right Hip ABduction 5/5   Left Hip Flexion 5/5   Left Hip Extension 3/5  had been 4-/5 but this was tested without prothesis    Left Hip ABduction 5/5   Right Knee Extension 5/5   Ambulation/Gait   Ambulation/Gait Yes   Ambulation/Gait Assistance 5: Supervision   Ambulation Distance (Feet) 226 Feet  20 feet x 2 with hemiwalker    Assistive device Rolling walker                             PT Education - 01/14/16 1520    Education provided Yes   Education Details Educated in ambulation with hemiwalker and prothesis    Methods Explanation;Demonstration;Tactile cues   Comprehension Verbalized understanding;Returned demonstration          PT Short Term Goals - 01/14/16 1455    PT SHORT TERM GOAL #1   Title Pt to be able to [name] and [name] independently   Time 2   Period Weeks   Status Achieved   PT SHORT TERM GOAL #2   Title Pt to be able to ambulate 800 ft with least assistive device with prothesis in 6 minutes to attend church and community activiies    Time 2   Period Weeks   Status On-going   PT SHORT TERM GOAL #3   Title Pt gluteal maximus mm to be at least 4/5 to allow pt to come from sit to stand with no UE assist to be able to sit in chairs without arms.    Time 2   Period Weeks   Status On-going           PT Long Term Goals - 01/14/16 1518    PT LONG TERM GOAL #1   Title Pt to be able to tolerate standing with no upper extremity support for 10 minutes to be able to sing in the choir again    Period Weeks   Status On-going   PT LONG TERM GOAL #2   Title Pt to be able to ambulate with his prothesis with a cane or least assisve device  for short distances to be  able to have one  hand free to carry coffee; or glass of water    Time 6   Period Weeks   Status On-going   PT LONG TERM GOAL #3   Title Pt to be able to ambulate with prothesis on uneven surfaces for 1000 feet with walker to allow pt to walk over uneven ground to go to grandson's baseball games    Time 8   Period Weeks   Status On-going   PT LONG TERM GOAL #4   Title Pt to be able to go up and down 4 steps with upper extremity suppport on railings to be able to exit buildings in case of emergency   Time 8   Period Weeks   Status On-going               Plan - 02-12-16 1521    Clinical Impression Statement Pt reassessment due today.  Pt has only had his prothesis for the past three visits therefore most goals have not been obtained.  Begain gait training with  small quad cane but pt is not ready for this at this time; tried with a hemiwalker which pt showed increased confidence.    Pt will benefit from skilled therapeutic intervention in order to improve on the following deficits Abnormal gait;Decreased activity tolerance;Decreased balance;Decreased knowledge of use of DME;Decreased mobility;Decreased strength;Difficulty walking;Prosthetic Dependency   Rehab Potential Good   PT Frequency 3x / week   PT Duration 8 weeks   PT Treatment/Interventions ADLs/Self Care Home Management;Therapeutic activities;Therapeutic exercise;Balance training;Patient/family education;Prosthetic Training   PT Next Visit Plan Gait train with hemiwalker.  Have pt begin going up 2" step in parallel bars.           G-Codes - 02/12/2016 1523    Functional Assessment Tool Used clinical judgement    Functional Limitation Mobility: Walking and moving around   Mobility: Walking and Moving Around Current Status [phone]) At least 20 percent but less than 40 percent impaired, limited or restricted   Mobility: Walking and Moving Around Goal Status [phone]) At least 1 percent but less than 20 percent impaired, limited or restricted       Problem List Patient Active Problem List   Diagnosis Date Noted  . Weight gain 11/30/2015  . Coronary artery disease involving coronary bypass graft of native heart without angina pectoris   . Tachycardia   . Leukocytosis   . Abnormality of gait   . Status post above knee amputation of left lower extremity ([place])   . Acute blood loss anemia   . Acute pulmonary edema (HCC)   . Acute congestive heart failure ([place])   . Acute respiratory failure with hypoxia ([place])   . Pressure ulcer 11/09/2015  . Necrotic toes ([place]) 11/08/2015  . Lower extremity cellulitis 11/08/2015  . Bradycardia 05/28/2015  . PVD (peripheral vascular disease) with claudication ([place]) 09/30/2014  . Encounter for therapeutic drug monitoring 11/26/2013  . Peripheral vascular disease ([place]) 08/06/2012  . Chronic anticoagulation 01/20/2011  . Diabetes mellitus ([place]) 03/04/2010  . Dyslipidemia 03/04/2010  . Essential hypertension 03/04/2010  . Hx of CABG 03/04/2010  . Permanent atrial fibrillation [facility]) 04/08/2009    Rayetta Humphrey, PT CLT [phone] 2016/02/12, 3:24 PM  [place] [place] [address] [place], [name], [address] Phone: [phone]   Fax:  [phone]  Name: [name] MRN: [id number] Date of Birth: 10/30/1935

## 2016-01-17 ENCOUNTER — Ambulatory Visit (HOSPITAL_COMMUNITY): Payer: Medicare Other | Admitting: Physical Therapy

## 2016-01-17 DIAGNOSIS — S78112A Complete traumatic amputation at level between left hip and knee, initial encounter: Secondary | ICD-10-CM

## 2016-01-17 DIAGNOSIS — R269 Unspecified abnormalities of gait and mobility: Secondary | ICD-10-CM

## 2016-01-17 DIAGNOSIS — R29898 Other symptoms and signs involving the musculoskeletal system: Secondary | ICD-10-CM

## 2016-01-17 NOTE — Therapy (Signed)
[facility] [address] [place], [name], [address] Phone: [phone]   Fax:  [phone]  Physical Therapy Treatment  Patient Details  Name: [name] MRN: [id number] Date of Birth: Nov 20, 1935 Referring Provider: Dr. Linton Ham  Encounter Date: 01/17/2016      PT End of Session - 01/17/16 1632    Visit Number 11   Number of Visits 24   Date for PT Re-Evaluation 02/13/16   Authorization Type medicare   Authorization Time Period g code by visit 25 ; KX by 15   Authorization - Visit Number 11   Authorization - Number of Visits 20   PT Start Time 8315   PT Stop Time 1523   PT Time Calculation (min) 44 min   Equipment Utilized During Treatment Gait belt   Activity Tolerance Patient tolerated treatment well   Behavior During Therapy [facility] for tasks assessed/performed      Past Medical History  Diagnosis Date  . Arteriosclerotic cardiovascular disease (ASCVD)     CABG in 1990s; 2002 total obstruction of LAD, CX and RCA with patent grafts and nl EF; Stress nuc. 2008 - mild LV dilation; normal EF; questionable small anteroapical scar; no ischemia  . Atrial fibrillation ([place])   . Chronic anticoagulation   . Arthritis     "left knee" (11/08/2015)  . Pedal edema     "not a lot", per pt.  . Diabetic peripheral neuropathy (HCC)     bilateral lower leg, hands  . Hypertension     states under control with meds., has been on med. x "long time"  . Peripheral vascular disease ([place])   . 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 Dental crowns present   . Runny nose 07/27/2015    clear drainage, per pt.  . Myocardial infarction ([place]) 1990s    "after my jaw OR"  . Type II diabetes mellitus ([place])     "diet controlled" (11/08/2015)  . Iron deficiency anemia   . History of blood transfusion     "related to my leg surgery?"  . Cellulitis 11/08/2015    "both feet"  . Jaw cancer ([place]) 1990s    "squamous cell"     Past Surgical History  Procedure Laterality Date  . Squamous cell carcinoma excision Left 1993    "took my jaw out; got cadavar in there now"  . Tonsillectomy    . Orif tibia fracture Left ~ 1970  . Colonoscopy N/A 11/27/2014    Procedure: COLONOSCOPY;  Surgeon: Rogene Houston, MD;  [facility];  Service: Endoscopy;  Laterality: N/A;  830  . Incision and drainage abscess Left 06/16/2005    wide exc. abscess 5th toe  . Mandibular hardware removal Left 08/02/2015    Procedure:  HARDWARE REMOVAL TWO MANDIBULAR SCREWS;  Surgeon: Jannette Fogo, DDS;  Location: [facility];  Service: Oral Surgery;  Laterality: Left;  . Fracture surgery    . Cataract extraction w/ intraocular lens  implant, bilateral Bilateral 09/2015  . Cardiac catheterization  1990s; 04/16/2001  . Coronary artery bypass graft  1990's    "CABG X4"  . I&d extremity Left 11/10/2015    Procedure: INCISION AND DRAINAGE LEFT FOOT, AMPUTATION OF LEFT THIRD TOE;  Surgeon: Conrad [place], MD;  Location: [facility];  Service: Vascular;  Laterality: Left;  . Amputation Left 11/11/2015    Procedure: LEFT ABOVE KNEE AMPUTATION;  Surgeon: Angelia Mould,  MD;  Location: [facility];  Service: Vascular;  Laterality: Left;    There were no vitals filed for this visit.  Visit Diagnosis:  Abnormality of gait  Bilateral leg weakness  Above knee amputation of left lower extremity (HCC)      Subjective Assessment - 01/17/16 1627    Subjective Pt states that his prothesis is feeling better    Pertinent History atrial fibullation, HTN, OA, PVD, hx of skin cancer ,   How long can you sit comfortably? no problem   Patient Stated Goals To be able to walk with his prothesis.    Currently in Pain? No/denies                         [facility] Adult PT Treatment/Exercise - 01/17/16 0001    Ambulation/Gait   Ambulation/Gait Yes   Ambulation/Gait Assistance 4: Min assist   Ambulation/Gait Assistance Details with  prothesis; obstacle placed in front of patient.    Ambulation Distance (Feet) 100 Feet  x6   Assistive device Hemi-walker   Lumbar Exercises: Standing   Other Standing Lumbar Exercises in parallel bars going up and down 4" step x 10 reps              Balance Exercises - 01/17/16 1629    Balance Exercises: Standing   Tandem Stance Eyes open;2 reps  x 15 seconds   Standing, One Foot on a Step Eyes open;2 inch  x 2 reps x 15 seconds each           PT Education - 01/17/16 1631    Education provided Yes   Education Details reviewed ambulation with hemiwalker as well as making sure that he patient feels the chair behind him prior to sitting down, (no lunging)    Person(s) Educated Patient   Methods Explanation   Comprehension Returned demonstration;Verbalized understanding          PT Short Term Goals - 01/14/16 1455    PT SHORT TERM GOAL #1   Title Pt to be able to [name] and [name] independently   Time 2   Period Weeks   Status Achieved   PT SHORT TERM GOAL #2   Title Pt to be able to ambulate 800 ft with least assistive device with prothesis in 6 minutes to attend church and community activiies    Time 2   Period Weeks   Status On-going   PT SHORT TERM GOAL #3   Title Pt gluteal maximus mm to be at least 4/5 to allow pt to come from sit to stand with no UE assist to be able to sit in chairs without arms.    Time 2   Period Weeks   Status On-going           PT Long Term Goals - 01/14/16 1518    PT LONG TERM GOAL #1   Title Pt to be able to tolerate standing with no upper extremity support for 10 minutes to be able to sing in the choir again    Period Weeks   Status On-going   PT LONG TERM GOAL #2   Title Pt to be able to ambulate with his prothesis with a cane or least assisve device  for short distances to be able to have one hand free to carry coffee; or glass of water    Time 6   Period Weeks   Status On-going   PT LONG TERM GOAL #3  Title Pt  to be able to ambulate with prothesis on uneven surfaces for 1000 feet with walker to allow pt to walk over uneven ground to go to grandson's baseball games    Time 8   Period Weeks   Status On-going   PT LONG TERM GOAL #4   Title Pt to be able to go up and down 4 steps with upper extremity suppport on railings to be able to exit buildings in case of emergency   Time 8   Period Weeks   Status On-going               Plan - 01/17/16 1633    Clinical Impression Statement Pt was not as confident with hemiwalker as he appeared to be last session.  Reviewed proper technique with hemiwalker,  Began stairclimbing activiteis with min assist for safety.  Pt prothersis was twisting pt was not wearing any socks; therapist emphasized the importance of wearing socks with prothesis .   Pt will benefit from skilled therapeutic intervention in order to improve on the following deficits Abnormal gait;Decreased activity tolerance;Decreased balance;Decreased knowledge of use of DME;Decreased mobility;Decreased strength;Difficulty walking;Prosthetic Dependency   Rehab Potential Good   PT Frequency 3x / week   PT Duration 8 weeks   PT Treatment/Interventions ADLs/Self Care Home Management;Therapeutic activities;Therapeutic exercise;Balance training;Patient/family education;Prosthetic Training   PT Next Visit Plan continue with stair instructions increasing to 6"; continue with gait training with hemiwalker advancing to quad cane as able.         Problem List Patient Active Problem List   Diagnosis Date Noted  . Weight gain 11/30/2015  . Coronary artery disease involving coronary bypass graft of native heart without angina pectoris   . Tachycardia   . Leukocytosis   . Abnormality of gait   . Status post above knee amputation of left lower extremity ([place])   . Acute blood loss anemia   . Acute pulmonary edema (HCC)   . Acute congestive heart failure ([place])   . Acute respiratory failure with hypoxia  ([place])   . Pressure ulcer 11/09/2015  . Necrotic toes ([place]) 11/08/2015  . Lower extremity cellulitis 11/08/2015  . Bradycardia 05/28/2015  . PVD (peripheral vascular disease) with claudication ([place]) 09/30/2014  . Encounter for therapeutic drug monitoring 11/26/2013  . Peripheral vascular disease ([place]) 08/06/2012  . Chronic anticoagulation 01/20/2011  . Diabetes mellitus ([place]) 03/04/2010  . Dyslipidemia 03/04/2010  . Essential hypertension 03/04/2010  . Hx of CABG 03/04/2010  . Permanent atrial fibrillation [facility]) 04/08/2009   Rayetta Humphrey, PT CLT [phone] 01/17/2016, 4:38 PM  [facility] [address] [place], [name], [address] Phone: [phone]   Fax:  [phone]  Name: TAQUAN BRALLEY MRN: [id number] Date of Birth: February 16, 1936

## 2016-01-19 ENCOUNTER — Ambulatory Visit (HOSPITAL_COMMUNITY): Payer: Medicare Other | Admitting: Physical Therapy

## 2016-01-19 DIAGNOSIS — S78112A Complete traumatic amputation at level between left hip and knee, initial encounter: Secondary | ICD-10-CM

## 2016-01-19 DIAGNOSIS — R269 Unspecified abnormalities of gait and mobility: Secondary | ICD-10-CM

## 2016-01-19 DIAGNOSIS — R29898 Other symptoms and signs involving the musculoskeletal system: Secondary | ICD-10-CM

## 2016-01-19 NOTE — Therapy (Signed)
[facility] [address] [place], [name], [address] Phone: [phone]   Fax:  [phone]  Physical Therapy Treatment  Patient Details  Name: [name] MRN: [id number] Date of Birth: 1936-03-16 Referring Provider: Dr. Linton Ham  Encounter Date: 01/19/2016      PT End of Session - 01/19/16 1852    Visit Number 12   Number of Visits 24   Date for PT Re-Evaluation 02/13/16   Authorization Type medicare   Authorization Time Period g code by visit 64 ; KX by 15   Authorization - Visit Number 12   Authorization - Number of Visits 20   PT Start Time 1435   PT Stop Time 1515   PT Time Calculation (min) 40 min   Equipment Utilized During Treatment Gait belt   Activity Tolerance Patient tolerated treatment well   Behavior During Therapy [facility] for tasks assessed/performed      Past Medical History  Diagnosis Date  . Arteriosclerotic cardiovascular disease (ASCVD)     CABG in 1990s; 2002 total obstruction of LAD, CX and RCA with patent grafts and nl EF; Stress nuc. 2008 - mild LV dilation; normal EF; questionable small anteroapical scar; no ischemia  . Atrial fibrillation ([place])   . Chronic anticoagulation   . Arthritis     "left knee" (11/08/2015)  . Pedal edema     "not a lot", per pt.  . Diabetic peripheral neuropathy (HCC)     bilateral lower leg, hands  . Hypertension     states under control with meds., has been on med. x "long time"  . Peripheral vascular disease ([place])   . 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 Dental crowns present   . Runny nose 07/27/2015    clear drainage, per pt.  . Myocardial infarction ([place]) 1990s    "after my jaw OR"  . Type II diabetes mellitus ([place])     "diet controlled" (11/08/2015)  . Iron deficiency anemia   . History of blood transfusion     "related to my leg surgery?"  . Cellulitis 11/08/2015    "both feet"  . Jaw cancer ([place]) 1990s    "squamous cell"     Past Surgical History  Procedure Laterality Date  . Squamous cell carcinoma excision Left 1993    "took my jaw out; got cadavar in there now"  . Tonsillectomy    . Orif tibia fracture Left ~ 1970  . Colonoscopy N/A 11/27/2014    Procedure: COLONOSCOPY;  Surgeon: Rogene [place], MD;  [facility];  Service: Endoscopy;  Laterality: N/A;  830  . Incision and drainage abscess Left 06/16/2005    wide exc. abscess 5th toe  . Mandibular hardware removal Left 08/02/2015    Procedure:  HARDWARE REMOVAL TWO MANDIBULAR SCREWS;  Surgeon: Jannette Fogo, DDS;  Location: [facility];  Service: Oral Surgery;  Laterality: Left;  . Fracture surgery    . Cataract extraction w/ intraocular lens  implant, bilateral Bilateral 09/2015  . Cardiac catheterization  1990s; 04/16/2001  . Coronary artery bypass graft  1990's    "CABG X4"  . I&d extremity Left 11/10/2015    Procedure: INCISION AND DRAINAGE LEFT FOOT, AMPUTATION OF LEFT THIRD TOE;  Surgeon: Conrad [place], MD;  Location: [facility];  Service: Vascular;  Laterality: Left;  . Amputation Left 11/11/2015    Procedure: LEFT ABOVE KNEE AMPUTATION;  Surgeon: Angelia Mould,  MD;  Location: [facility];  Service: Vascular;  Laterality: Left;    There were no vitals filed for this visit.  Visit Diagnosis:  Abnormality of gait  Bilateral leg weakness  Above knee amputation of left lower extremity (HCC)      Subjective Assessment - 01/19/16 1858    Subjective Pt states he is wearing more socks today and has not had his prosthesis slip any.  No pain.   Currently in Pain? No/denies                         [facility] Adult PT Treatment/Exercise - 01/19/16 1454    Ambulation/Gait   Ambulation/Gait Yes   Ambulation/Gait Assistance 4: Min assist   Ambulation/Gait Assistance Details with Lt LE prosthesis AKA   Ambulation Distance (Feet) 100 Feet   Assistive device Hemi-walker   Lumbar Exercises: Standing   Other Standing  Lumbar Exercises in parallel bars going up and down 6" step x 10 reps    Other Standing Lumbar Exercises side stepping with hemiwalker 2RT in front of mat   Knee/Hip Exercises: Seated   Sit to Sand 5 reps;without UE support                  PT Short Term Goals - 01/14/16 1455    PT SHORT TERM GOAL #1   Title Pt to be able to [name] and [name] independently   Time 2   Period Weeks   Status Achieved   PT SHORT TERM GOAL #2   Title Pt to be able to ambulate 800 ft with least assistive device with prothesis in 6 minutes to attend church and community activiies    Time 2   Period Weeks   Status On-going   PT SHORT TERM GOAL #3   Title Pt gluteal maximus mm to be at least 4/5 to allow pt to come from sit to stand with no UE assist to be able to sit in chairs without arms.    Time 2   Period Weeks   Status On-going           PT Long Term Goals - 01/14/16 1518    PT LONG TERM GOAL #1   Title Pt to be able to tolerate standing with no upper extremity support for 10 minutes to be able to sing in the choir again    Period Weeks   Status On-going   PT LONG TERM GOAL #2   Title Pt to be able to ambulate with his prothesis with a cane or least assisve device  for short distances to be able to have one hand free to carry coffee; or glass of water    Time 6   Period Weeks   Status On-going   PT LONG TERM GOAL #3   Title Pt to be able to ambulate with prothesis on uneven surfaces for 1000 feet with walker to allow pt to walk over uneven ground to go to grandson's baseball games    Time 8   Period Weeks   Status On-going   PT LONG TERM GOAL #4   Title Pt to be able to go up and down 4 steps with upper extremity suppport on railings to be able to exit buildings in case of emergency   Time 8   Period Weeks   Status On-going               Plan - 01/19/16 1853  Clinical Impression Statement Continued progression with LE strengthening and stability.  Resumed gait  with hemiwalker and began side stepping as well.  Pt able to complete without LOB and with increased confidence this session.  Progressed to 6" step in parallel bars with good negotiation with Rt LE lead up/Lt LE lead down.  No twisting of prosthesis this session as patient wearing more sock layers.  Pt did report some discomfort of prosthesis into groin region with certain movements.  Sit to stands completed without UE assist this session and static balance with perturbations.    Pt will benefit from skilled therapeutic intervention in order to improve on the following deficits Abnormal gait;Decreased activity tolerance;Decreased balance;Decreased knowledge of use of DME;Decreased mobility;Decreased strength;Difficulty walking;Prosthetic Dependency   Rehab Potential Good   PT Frequency 3x / week   PT Duration 8 weeks   PT Treatment/Interventions ADLs/Self Care Home Management;Therapeutic activities;Therapeutic exercise;Balance training;Patient/family education;Prosthetic Training   PT Next Visit Plan continue with stair instructions and gait training with hemiwalker advancing to quad cane as able.         Problem List Patient Active Problem List   Diagnosis Date Noted  . Weight gain 11/30/2015  . Coronary artery disease involving coronary bypass graft of native heart without angina pectoris   . Tachycardia   . Leukocytosis   . Abnormality of gait   . Status post above knee amputation of left lower extremity ([place])   . Acute blood loss anemia   . Acute pulmonary edema (HCC)   . Acute congestive heart failure ([place])   . Acute respiratory failure with hypoxia ([place])   . Pressure ulcer 11/09/2015  . Necrotic toes ([place]) 11/08/2015  . Lower extremity cellulitis 11/08/2015  . Bradycardia 05/28/2015  . PVD (peripheral vascular disease) with claudication ([place]) 09/30/2014  . Encounter for therapeutic drug monitoring 11/26/2013  . Peripheral vascular disease ([place]) 08/06/2012  . Chronic  anticoagulation 01/20/2011  . Diabetes mellitus ([place]) 03/04/2010  . Dyslipidemia 03/04/2010  . Essential hypertension 03/04/2010  . Hx of CABG 03/04/2010  . Permanent atrial fibrillation ([place]) 04/08/2009    Teena Irani, PTA/CLT [phone]  01/19/2016, 6:59 PM  [facility] [address] [place], [name], [address] Phone: [phone]   Fax:  [phone]  Name: [name] MRN: [id number] Date of Birth: 1936/01/28

## 2016-01-21 ENCOUNTER — Ambulatory Visit (INDEPENDENT_AMBULATORY_CARE_PROVIDER_SITE_OTHER): Payer: Medicare Other | Admitting: Adult Health

## 2016-01-21 ENCOUNTER — Ambulatory Visit (HOSPITAL_COMMUNITY): Payer: Medicare Other | Admitting: Physical Therapy

## 2016-01-21 ENCOUNTER — Encounter: Payer: Self-pay | Admitting: Adult Health

## 2016-01-21 VITALS — BP 122/70 | HR 83 | Ht 68.0 in | Wt 176.0 lb

## 2016-01-21 DIAGNOSIS — R29898 Other symptoms and signs involving the musculoskeletal system: Secondary | ICD-10-CM

## 2016-01-21 DIAGNOSIS — I10 Essential (primary) hypertension: Secondary | ICD-10-CM

## 2016-01-21 DIAGNOSIS — R269 Unspecified abnormalities of gait and mobility: Secondary | ICD-10-CM | POA: Diagnosis not present

## 2016-01-21 DIAGNOSIS — I482 Chronic atrial fibrillation: Secondary | ICD-10-CM

## 2016-01-21 DIAGNOSIS — I4821 Permanent atrial fibrillation: Secondary | ICD-10-CM

## 2016-01-21 DIAGNOSIS — I2510 Atherosclerotic heart disease of native coronary artery without angina pectoris: Secondary | ICD-10-CM

## 2016-01-21 DIAGNOSIS — S78112A Complete traumatic amputation at level between left hip and knee, initial encounter: Secondary | ICD-10-CM

## 2016-01-21 MED ORDER — WARFARIN SODIUM 2 MG PO TABS: 2.0000 mg | ORAL_TABLET | Freq: Every day | ORAL | Status: DC

## 2016-01-21 MED ORDER — LOSARTAN POTASSIUM-HCTZ 100-25 MG PO TABS: 1.0000 | ORAL_TABLET | Freq: Every day | ORAL | Status: DC

## 2016-01-21 MED ORDER — AMLODIPINE BESYLATE 10 MG PO TABS: 10.0000 mg | ORAL_TABLET | Freq: Every day | ORAL | Status: DC

## 2016-01-21 NOTE — Therapy (Signed)
[facility] [place], [name], [address] Phone: [phone]   Fax:  [phone]  Physical Therapy Treatment  Patient Details  Name: [name] MRN: [id number] Date of Birth: [birth year] Referring Provider: Dr. Linton Ham  Encounter Date: 01/21/2016      PT End of Session - 01/21/16 1144    Visit Number 13   Number of Visits 24   Date for PT Re-Evaluation 02/13/16   Authorization Type medicare   Authorization Time Period g code by visit 56 ; KX by 15   Authorization - Visit Number 13   Authorization - Number of Visits 20   PT Start Time 1100   PT Stop Time 1145   PT Time Calculation (min) 45 min   Equipment Utilized During Treatment Gait belt   Activity Tolerance Patient tolerated treatment well      Past Medical History  Diagnosis Date  . Arteriosclerotic cardiovascular disease (ASCVD)     CABG in 1990s; 2002 total obstruction of LAD, CX and RCA with patent grafts and nl EF; Stress nuc. 2008 - mild LV dilation; normal EF; questionable small anteroapical scar; no ischemia  . Atrial fibrillation ([place])   . Chronic anticoagulation   . Arthritis     "left knee" (11/08/2015)  . Pedal edema     "not a lot", per pt.  . Diabetic peripheral neuropathy (HCC)     bilateral lower leg, hands  . Hypertension     states under control with meds., has been on med. x "long time"  . Peripheral vascular disease ([place])   . 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 Dental crowns present   . Runny nose 07/27/2015    clear drainage, per pt.  . Myocardial infarction ([place]) 1990s    "after my jaw OR"  . Type II diabetes mellitus ([place])     "diet controlled" (11/08/2015)  . Iron deficiency anemia   . History of blood transfusion     "related to my leg surgery?"  . Cellulitis 11/08/2015    "both feet"  . Jaw cancer ([place]) 1990s    "squamous cell"    Past Surgical History  Procedure Laterality Date  .  Squamous cell carcinoma excision Left 1993    "took my jaw out; got cadavar in there now"  . Tonsillectomy    . Orif tibia fracture Left ~ 1970  . Colonoscopy N/A 11/27/2014    Procedure: COLONOSCOPY;  Surgeon: Rogene Houston, MD;  [facility];  Service: Endoscopy;  Laterality: N/A;  830  . Incision and drainage abscess Left 06/16/2005    wide exc. abscess 5th toe  . Mandibular hardware removal Left 08/02/2015    Procedure:  HARDWARE REMOVAL TWO MANDIBULAR SCREWS;  Surgeon: Jannette Fogo, DDS;  Location: [facility];  Service: Oral Surgery;  Laterality: Left;  . Fracture surgery    . Cataract extraction w/ intraocular lens  implant, bilateral Bilateral 09/2015  . Cardiac catheterization  1990s; 04/16/2001  . Coronary artery bypass graft  1990's    "CABG X4"  . I&d extremity Left 11/10/2015    Procedure: INCISION AND DRAINAGE LEFT FOOT, AMPUTATION OF LEFT THIRD TOE;  Surgeon: Conrad Sherrill, MD;  Location: [facility];  Service: Vascular;  Laterality: Left;  . Amputation Left 11/11/2015    Procedure: LEFT ABOVE KNEE AMPUTATION;  Surgeon: Angelia Mould, MD;  Location: [facility];  Service: Vascular;  Laterality: Left;    There were no vitals filed for this visit.  Visit Diagnosis:  Abnormality of gait  Bilateral leg weakness  Above knee amputation of left lower extremity (HCC)      Subjective Assessment - 01/21/16 1058    Subjective Pt states he has been wearing his prothesis all day without difficulty.     Currently in Pain? No/denies                         [facility] Adult PT Treatment/Exercise - 01/21/16 0001    Ambulation/Gait   Ambulation/Gait Yes   Ambulation/Gait Assistance 4: Min assist   Ambulation Distance (Feet) 110 Feet  x2   Assistive device Hemi-walker   Lumbar Exercises: Standing   Other Standing Lumbar Exercises in parallel bars going up and down 6" step x 10 ; lift tray of putty into highest cabinet shelf with both hands x 10 reps     Other Standing Lumbar Exercises side stepping with hemiwalker 2RT in front of mat   Knee/Hip Exercises: Seated   Sit to Sand 5 reps;without UE support                  PT Short Term Goals - 01/14/16 1455    PT SHORT TERM GOAL #1   Title Pt to be able to [name] and [name] independently   Time 2   Period Weeks   Status Achieved   PT SHORT TERM GOAL #2   Title Pt to be able to ambulate 800 ft with least assistive device with prothesis in 6 minutes to attend church and community activiies    Time 2   Period Weeks   Status On-going   PT SHORT TERM GOAL #3   Title Pt gluteal maximus mm to be at least 4/5 to allow pt to come from sit to stand with no UE assist to be able to sit in chairs without arms.    Time 2   Period Weeks   Status On-going           PT Long Term Goals - 01/14/16 1518    PT LONG TERM GOAL #1   Title Pt to be able to tolerate standing with no upper extremity support for 10 minutes to be able to sing in the choir again    Period Weeks   Status On-going   PT LONG TERM GOAL #2   Title Pt to be able to ambulate with his prothesis with a cane or least assisve device  for short distances to be able to have one hand free to carry coffee; or glass of water    Time 6   Period Weeks   Status On-going   PT LONG TERM GOAL #3   Title Pt to be able to ambulate with prothesis on uneven surfaces for 1000 feet with walker to allow pt to walk over uneven ground to go to grandson's baseball games    Time 8   Period Weeks   Status On-going   PT LONG TERM GOAL #4   Title Pt to be able to go up and down 4 steps with upper extremity suppport on railings to be able to exit buildings in case of emergency   Time 8   Period Weeks   Status On-going               Plan - 01/21/16 1145    Clinical Impression Statement Pt prothesis is  slipping.  Educated in the need to use additional socks to keep prothesis from turning.  Educated pt that toes should always be  forward; if he sees that his forefoot is turning in or out then he needs to put an additional sock on his prothesis.  Pt tends to externally rotate hips when advancing Lt LE; verbal and tactile cuing needed to prevent this.     Pt will benefit from skilled therapeutic intervention in order to improve on the following deficits Abnormal gait;Decreased activity tolerance;Decreased balance;Decreased knowledge of use of DME;Decreased mobility;Decreased strength;Difficulty walking;Prosthetic Dependency   Rehab Potential Good   PT Frequency 3x / week   PT Duration 8 weeks   PT Treatment/Interventions ADLs/Self Care Home Management;Therapeutic activities;Therapeutic exercise;Balance training;Patient/family education;Prosthetic Training   PT Next Visit Plan continue with stair instructions and gait training advance to large quad cane; attempt 4" steps         Problem List Patient Active Problem List   Diagnosis Date Noted  . Weight gain 11/30/2015  . Coronary artery disease involving coronary bypass graft of native heart without angina pectoris   . Tachycardia   . Leukocytosis   . Abnormality of gait   . Status post above knee amputation of left lower extremity ([place])   . Acute blood loss anemia   . Acute pulmonary edema (HCC)   . Acute congestive heart failure ([place])   . Acute respiratory failure with hypoxia ([place])   . Pressure ulcer 11/09/2015  . Necrotic toes ([place]) 11/08/2015  . Lower extremity cellulitis 11/08/2015  . Bradycardia 05/28/2015  . PVD (peripheral vascular disease) with claudication ([place]) 09/30/2014  . Encounter for therapeutic drug monitoring 11/26/2013  . Peripheral vascular disease ([place]) 08/06/2012  . Chronic anticoagulation 01/20/2011  . Diabetes mellitus ([place]) 03/04/2010  . Dyslipidemia 03/04/2010  . Essential hypertension 03/04/2010  . Hx of CABG 03/04/2010  . Permanent atrial fibrillation [facility]) 04/08/2009    Rayetta Humphrey, PT CLT [phone] 01/21/2016, 12:04  PM  [facility] [address] [place], [name], [address] Phone: [phone]   Fax:  [phone]  Name: [name] MRN: [id number] Date of Birth: 10-27-1935

## 2016-01-21 NOTE — Progress Notes (Signed)
Cardiology Office Note   Date:  01/21/2016   ID:  THESEUS [name], DOB [birth year], MRN [id number]  PCP:  Robert Bellow, MD  Cardiologist:  Woodroe Chen, NP   Chief Complaint  Patient presents with  . Coronary Artery Disease  . Atrial Fibrillation      History of Present Illness: [name] is a 80 y.o. male who presents for ongoing assessment and management of CAD, history of CABG in the 1990s, paroxysmal atrial fibrillation, on Coumadin therapy, CHADS VASC Score of 4.  Her last office visit in February of 2017.  He was doing well, he had an AKA and was being fitted for a leg prosthesis on the left.at the time of last office visit.  He was stable from a cardiology standpoint.  Consideration for decreasing amlodipine 25 mg daily was noted.  If the patient was lightheaded and dizzy.  It was noted that his blood pressure was lower than normal.  He is doing extremely well.  He is working with a prosthetic leg, he is learning to climb stairs, he denies any chest pain, and soreness, dizziness, or palpitations.  Past Medical History  Diagnosis Date  . Arteriosclerotic cardiovascular disease (ASCVD)     CABG in 1990s; 2002 total obstruction of LAD, CX and RCA with patent grafts and nl EF; Stress nuc. 2008 - mild LV dilation; normal EF; questionable small anteroapical scar; no ischemia  . Atrial fibrillation ([place])   . Chronic anticoagulation   . Arthritis     "left knee" (11/08/2015)  . Pedal edema     "not a lot", per pt.  . Diabetic peripheral neuropathy (HCC)     bilateral lower leg, hands  . Hypertension     states under control with meds., has been on med. x "long time"  . Peripheral vascular disease ([place])   . 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 Dental crowns present   . Runny nose 07/27/2015    clear drainage, per pt.  . Myocardial infarction ([place]) 1990s    "after my jaw OR"  . Type II diabetes mellitus ([place])     "diet  controlled" (11/08/2015)  . Iron deficiency anemia   . History of blood transfusion     "related to my leg surgery?"  . Cellulitis 11/08/2015    "both feet"  . Jaw cancer ([place]) 1990s    "squamous cell"    Past Surgical History  Procedure Laterality Date  . Squamous cell carcinoma excision Left 1993    "took my jaw out; got cadavar in there now"  . Tonsillectomy    . Orif tibia fracture Left ~ 1970  . Colonoscopy N/A 11/27/2014    Procedure: COLONOSCOPY;  Surgeon: [name] Houston, MD;  [facility];  Service: Endoscopy;  Laterality: N/A;  830  . Incision and drainage abscess Left 06/16/2005    wide exc. abscess 5th toe  . Mandibular hardware removal Left 08/02/2015    Procedure:  HARDWARE REMOVAL TWO MANDIBULAR SCREWS;  Surgeon: [name], DDS;  Location: [facility];  Service: Oral Surgery;  Laterality: Left;  . Fracture surgery    . Cataract extraction w/ intraocular lens  implant, bilateral Bilateral 09/2015  . Cardiac catheterization  1990s; 04/16/2001  . Coronary artery bypass graft  1990's    "CABG X4"  . I&d extremity Left 11/10/2015    Procedure: INCISION AND DRAINAGE LEFT FOOT, AMPUTATION OF LEFT THIRD TOE;  Surgeon: [name] [place], MD;  Location: [facility];  Service: Vascular;  Laterality: Left;  . Amputation Left 11/11/2015    Procedure: LEFT ABOVE KNEE AMPUTATION;  Surgeon: [name] Mould, MD;  [facility];  Service: Vascular;  Laterality: Left;     Current Outpatient Prescriptions  Medication Sig Dispense Refill  . acetaminophen (TYLENOL) 325 MG tablet Take 2 tablets (650 mg total) by mouth every 6 (six) hours as needed for mild pain or moderate pain. 30 tablet 3  . amLODipine (NORVASC) 10 MG tablet Take 1 tablet (10 mg total) by mouth daily. 90 tablet 3  . aspirin 81 MG tablet Take 81 mg by mouth daily.    . Difluprednate (DUREZOL) 0.05 % EMUL Apply 1 drop to eye 2 (two) times daily.    . feeding supplement (BOOST / RESOURCE BREEZE) LIQD  Take 1 Container by mouth 3 (three) times daily between meals. 90 Container 4  . ferrous sulfate 325 (65 FE) MG tablet Take 65 mg by mouth daily with breakfast.    . fish oil-omega-3 fatty acids 1000 MG capsule Take 1 capsule by mouth daily.      Marland Kitchen losartan-hydrochlorothiazide (HYZAAR) 100-25 MG per tablet Take 1 tablet by mouth daily.      . Multiple Vitamins-Minerals (CENTRUM SILVER PO) Take 1 tablet by mouth daily.      . nebivolol (BYSTOLIC) 5 MG tablet Take 1 tablet (5 mg total) by mouth daily. 90 tablet 3  . Probiotic Product (PHILLIPS COLON HEALTH PO) Take 1 capsule by mouth daily.     . silver sulfADIAZINE (SILVADENE) 1 % cream Apply topically daily. 50 g 0  . warfarin (COUMADIN) 2 MG tablet Take 2 mg by mouth daily.     No current facility-administered medications for this visit.    Allergies:   Lipitor; Simvastatin; Penicillins; and Sulfa antibiotics    Social History:  The patient  reports that he has never smoked. He has never used smokeless tobacco. He reports that he does not drink alcohol or use illicit drugs.   Family History:  The patient's family history includes Heart disease in his mother.    ROS: All other systems are reviewed and negative. Unless otherwise mentioned in H&P    PHYSICAL EXAM: VS:  BP 122/70 mmHg  Pulse 83  Ht 5' 8"  (1.727 m)  Wt 176 lb (79.833 kg)  BMI 26.77 kg/m2  SpO2 98% , BMI Body mass index is 26.77 kg/(m^2). GEN: Well nourished, well developed, in no acute distress HEENT: normal Neck: no JVD, carotid bruits, or masses Cardiac: IRRR; no murmurs, rubs, or gallops,no edema  Respiratory:  clear to auscultation bilaterally, normal work of breathing GI: soft, nontender, nondistended, + BS MS: no deformity or atrophyleft AKA Skin: warm and dry, no rash Neuro:  Strength and sensation are intact Psych: euthymic mood, full affect  Recent Labs: 11/11/2015: ALT 39; TSH 4.746* 11/15/2015: Magnesium 1.9 12/01/2015: B Natriuretic Peptide  160.0* 12/08/2015: BUN 30*; Creatinine, Ser 0.83; Hemoglobin 11.0*; Platelets 344; Potassium 3.9; Sodium 139    Lipid Panel    Component Value Date/Time   CHOL 112 10/04/2009   CHOL 293 10/04/2009   TRIG 113 10/04/2009   TRIG 113 10/04/2009   HDL 28 10/04/2009   HDL 28 10/04/2009   LDLCALC 61 10/04/2009   LDLCALC 61 10/04/2009      Wt Readings from Last 3 Encounters:  01/21/16 176 lb (79.833 kg)  12/09/15 173 lb (78.472 kg)  12/08/15 174 lb (  78.926 kg)      ASSESSMENT AND PLAN:  1. CAD: Status post coronary artery bypass grafting.  He is doing very well, denies any discomfort in his chest, exertional chest pain, dyspnea, or medication reactions.  We will continue him on his current medication without changes.I reviewed all his medications and provided refills on his cardiac medications.  2. Hypertension:blood pressure is well controlled and stable.  There is no evidence of hypotension.  He will continue on his antihypertensive medications.  3. Atrial fibrillation:heart rate is well controlled.  He continues on Coumadin therapy, and is seen.  At least monthly in our [place] office for adjustments.     Current medicines are reviewed at length with the patient today.    Labs/ tests ordered today include:  No orders of the defined types were placed in this encounter.     Disposition:   FU with 6 months unless symptomatic  Signed, Jory Sims, NP  01/21/2016 1:42 PM    [facility] [address], [place], [place] [address] Phone: [phone]; Fax: [phone]

## 2016-01-21 NOTE — Progress Notes (Signed)
Name: [name]    DOB: 1936-06-02  Age: 80 y.o.  MR#: [id number]       PCP:  Robert Bellow, MD      Insurance: Payor: MEDICARE / Plan: MEDICARE PART A AND B / Product Type: *No Product type* /   CC:   No chief complaint on file.   VS Filed Vitals:   01/21/16 1338  BP: 122/70  Pulse: 83  Height: 5' 8"  (1.727 m)  Weight: 176 lb (79.833 kg)  SpO2: 98%    Weights Current Weight  01/21/16 176 lb (79.833 kg)  12/09/15 173 lb (78.472 kg)  12/08/15 174 lb (78.926 kg)    Blood Pressure  BP Readings from Last 3 Encounters:  01/21/16 122/70  12/09/15 118/68  12/08/15 135/57     Admit date:  (Not on file) Last encounter with RMR:  12/09/2015   Allergy Lipitor; Simvastatin; Penicillins; and Sulfa antibiotics  Current Outpatient Prescriptions  Medication Sig Dispense Refill  . acetaminophen (TYLENOL) 325 MG tablet Take 2 tablets (650 mg total) by mouth every 6 (six) hours as needed for mild pain or moderate pain. 30 tablet 3  . amLODipine (NORVASC) 10 MG tablet Take 1 tablet (10 mg total) by mouth daily. 90 tablet 3  . aspirin 81 MG tablet Take 81 mg by mouth daily.    . Difluprednate (DUREZOL) 0.05 % EMUL Apply 1 drop to eye 2 (two) times daily.    . feeding supplement (BOOST / RESOURCE BREEZE) LIQD Take 1 Container by mouth 3 (three) times daily between meals. 90 Container 4  . ferrous sulfate 325 (65 FE) MG tablet Take 65 mg by mouth daily with breakfast.    . fish oil-omega-3 fatty acids 1000 MG capsule Take 1 capsule by mouth daily.      Marland Kitchen losartan-hydrochlorothiazide (HYZAAR) 100-25 MG per tablet Take 1 tablet by mouth daily.      . Multiple Vitamins-Minerals (CENTRUM SILVER PO) Take 1 tablet by mouth daily.      . nebivolol (BYSTOLIC) 5 MG tablet Take 1 tablet (5 mg total) by mouth daily. 90 tablet 3  . Probiotic Product (PHILLIPS COLON HEALTH PO) Take 1 capsule by mouth daily.     . silver sulfADIAZINE (SILVADENE) 1 % cream Apply topically daily. 50 g 0  . warfarin  (COUMADIN) 2 MG tablet Take 2 mg by mouth daily.     No current facility-administered medications for this visit.    Discontinued Meds:    Medications Discontinued During This Encounter  Medication Reason  . Amino Acids-Protein Hydrolys (FEEDING SUPPLEMENT, PRO-STAT SUGAR FREE 64,) LIQD Error    Patient Active Problem List   Diagnosis Date Noted  . Weight gain 11/30/2015  . Coronary artery disease involving coronary bypass graft of native heart without angina pectoris   . Tachycardia   . Leukocytosis   . Abnormality of gait   . Status post above knee amputation of left lower extremity ([place])   . Acute blood loss anemia   . Acute pulmonary edema (HCC)   . Acute congestive heart failure ([place])   . Acute respiratory failure with hypoxia ([place])   . Pressure ulcer 11/09/2015  . Necrotic toes ([place]) 11/08/2015  . Lower extremity cellulitis 11/08/2015  . Bradycardia 05/28/2015  . PVD (peripheral vascular disease) with claudication ([place]) 09/30/2014  . Encounter for therapeutic drug monitoring 11/26/2013  . Peripheral vascular disease ([place]) 08/06/2012  . Chronic anticoagulation 01/20/2011  . Diabetes mellitus ([place]) 03/04/2010  . Dyslipidemia  03/04/2010  . Essential hypertension 03/04/2010  . Hx of CABG 03/04/2010  . Permanent atrial fibrillation (HCC) 04/08/2009    LABS    Component Value Date/Time   NA 139 12/08/2015 0730   NA 138 12/01/2015 0700   NA 139 11/17/2015 0720   K 3.9 12/08/2015 0730   K 4.3 12/01/2015 0700   K 4.3 11/17/2015 0720   CL 107 12/08/2015 0730   CL 107 12/01/2015 0700   CL 107 11/17/2015 0720   CO2 26 12/08/2015 0730   CO2 22 12/01/2015 0700   CO2 23 11/17/2015 0720   GLUCOSE 108* 12/08/2015 0730   GLUCOSE 122* 12/01/2015 0700   GLUCOSE 107* 11/17/2015 0720   BUN 30* 12/08/2015 0730   BUN 48* 12/01/2015 0700   BUN 42* 11/17/2015 0720   CREATININE 0.83 12/08/2015 0730   CREATININE 0.85 12/01/2015 0700   CREATININE 0.97 11/17/2015 0720    CREATININE 0.84 06/09/2013 1228   CREATININE 0.90 05/06/2013 1455   CALCIUM 8.5* 12/08/2015 0730   CALCIUM 8.5* 12/01/2015 0700   CALCIUM 7.7* 11/17/2015 0720   GFRNONAA >60 12/08/2015 0730   GFRNONAA >60 12/01/2015 0700   GFRNONAA >60 11/17/2015 0720   GFRAA >60 12/08/2015 0730   GFRAA >60 12/01/2015 0700   GFRAA >60 11/17/2015 0720   CMP     Component Value Date/Time   NA 139 12/08/2015 0730   K 3.9 12/08/2015 0730   CL 107 12/08/2015 0730   CO2 26 12/08/2015 0730   GLUCOSE 108* 12/08/2015 0730   BUN 30* 12/08/2015 0730   CREATININE 0.83 12/08/2015 0730   CREATININE 0.84 06/09/2013 1228   CALCIUM 8.5* 12/08/2015 0730   PROT 6.9 11/11/2015 0930   ALBUMIN 2.6* 11/11/2015 0930   AST 37 11/11/2015 0930   ALT 39 11/11/2015 0930   ALKPHOS 131* 11/11/2015 0930   BILITOT 2.1* 11/11/2015 0930   GFRNONAA >60 12/08/2015 0730   GFRAA >60 12/08/2015 0730       Component Value Date/Time   WBC 6.8 12/08/2015 0730   WBC 8.8 12/01/2015 0700   WBC 11.4* 11/18/2015 0645   HGB 11.0* 12/08/2015 0730   HGB 11.7* 12/01/2015 0700   HGB 11.4* 11/18/2015 0645   HCT 33.3* 12/08/2015 0730   HCT 35.2* 12/01/2015 0700   HCT 34.7* 11/18/2015 0645   MCV 96.5 12/08/2015 0730   MCV 97.8 12/01/2015 0700   MCV 96.4 11/18/2015 0645    Lipid Panel     Component Value Date/Time   CHOL 112 10/04/2009   CHOL 293 10/04/2009   TRIG 113 10/04/2009   TRIG 113 10/04/2009   HDL 28 10/04/2009   HDL 28 10/04/2009   LDLCALC 61 10/04/2009   LDLCALC 61 10/04/2009    ABG    Component Value Date/Time   PHART 7.464* 11/13/2015 0917   PCO2ART 34.9* 11/13/2015 0917   PO2ART 57.0* 11/13/2015 0917   HCO3 25.1* 11/13/2015 0917   TCO2 26 11/13/2015 0917   ACIDBASEDEF 4.0* 11/11/2015 0225   O2SAT 91.0 11/13/2015 0917     Lab Results  Component Value Date   TSH 4.746* 11/11/2015   BNP (last 3 results)  Recent Labs  11/11/15 0040 11/11/15 0930 12/01/15 0700  BNP 1070.3* 1346.1* 160.0*     ProBNP (last 3 results) No results for input(s): PROBNP in the last 8760 hours.  Cardiac Panel (last 3 results) No results for input(s): CKTOTAL, CKMB, TROPONINI, RELINDX in the last 72 hours.  Iron/TIBC/Ferritin/ %Sat No results found for: IRON,  TIBC, FERRITIN, IRONPCTSAT   EKG Orders placed or performed during the hospital encounter of 11/08/15  . EKG 12-Lead  . EKG 12-Lead  . EKG 12-Lead  . EKG 12-Lead     Prior Assessment and Plan Problem List as of 01/21/2016      Cardiovascular and Mediastinum   Essential hypertension   Last Assessment & Plan 05/06/2013 Office Visit Edited 05/21/2013  1:27 PM by Yehuda Savannah, MD    Blood pressure control has been suboptimal. Spironolactone added to medical regime with close monitoring of electrolytes and renal function.   Recommendation to the patient that he obtain blood pressures at home and pay closer attention to adjustment of medications to adequately control hypertension.  No recent laboratory test results available. We will seek records from Dr. Karie Kirks.      Permanent atrial fibrillation [facility])   Last Assessment & Plan 05/28/2015 Office Visit Written 05/28/2015  4:06 PM by Erlene Quan, PA-C    .      Peripheral vascular disease [facility])   Last Assessment & Plan 01/06/2013 Office Visit Edited 01/08/2013  9:57 AM by Yehuda Savannah, MD    Patient has atherosclerotic disease but no definite symptoms or problems related to that. Consultation with a vascular expert will be deferred. He has new edema, primarily in the right leg. Amlodipine dosage will be reduced markedly and venous ultrasound performed to exclude DVT. Salt restriction and leg elevation will also be emphasized. I will reassess his edema in 3 weeks.      PVD (peripheral vascular disease) with claudication [facility])   Last Assessment & Plan 09/30/2014 Office Visit Written 09/30/2014  2:45 PM by Angelia Mould, MD    Based on his exam, he has evidence of infrainguinal  arterial occlusive disease on the left side. On the right side however, he has palpable pedal pulses and a normal ABI. Given that his symptoms involving both lower extremities I'm not convinced that his symptoms are related to his peripheral vascular disease. He denies any back pain but his symptoms could be neurogenic in origin. He does have significant neuropathy. Given his infrainguinal arterial occlusive disease on the left, I would only consider arteriography and revascularization if he developed a nonhealing wound or significant rest pain. Fortunately, he is not a smoker. I have encouraged him to stay as active as possible. I'll plan on seeing him back in one year with follow up ABIs. He knows to call sooner if he has problems.      Acute congestive heart failure (HCC)   Coronary artery disease involving coronary bypass graft of native heart without angina pectoris     Respiratory   Acute pulmonary edema (HCC)   Acute respiratory failure with hypoxia ([place])     Endocrine   Diabetes mellitus [facility])   Last Assessment & Plan 05/28/2015 Office Visit Written 05/28/2015  4:08 PM by Erlene Quan, PA-C    +Peripheral neuropathy        Musculoskeletal and Integument   Pressure ulcer     Other   Dyslipidemia   Last Assessment & Plan 05/28/2015 Office Visit Written 05/28/2015  4:09 PM by Erlene Quan, PA-C    Statin intolerant      Hx of CABG   Last Assessment & Plan 05/28/2015 Office Visit Written 05/28/2015  4:08 PM by Erlene Quan, PA-C    CABG in the 1990s; 2002 patent grafts and nl EF; Stress nuc. 2008-mild LV dilatation; normal EF,  no ischemia      Chronic anticoagulation   Last Assessment & Plan 05/28/2015 Office Visit Written 05/28/2015  4:06 PM by Erlene Quan, PA-C    Warfarin      Encounter for therapeutic drug monitoring   Bradycardia   Necrotic toes ([place])   Lower extremity cellulitis   Tachycardia   Leukocytosis   Abnormality of gait   Status post above knee amputation of left  lower extremity (HCC)   Acute blood loss anemia   Weight gain       Imaging: No results found.

## 2016-01-21 NOTE — Patient Instructions (Signed)
Your physician wants you to follow-up in: 6 Months with Dr. Bronson Ing. You will receive a reminder letter in the mail two months in advance. If you don't receive a letter, please call our office to schedule the follow-up appointment.  Your physician recommends that you continue on your current medications as directed. Please refer to the Current Medication list given to you today.  If you need a refill on your cardiac medications before your next appointment, please call your pharmacy.  Thank you for choosing [facility]!

## 2016-01-24 ENCOUNTER — Encounter (HOSPITAL_COMMUNITY): Payer: Medicare Other | Admitting: Physical Therapy

## 2016-01-26 ENCOUNTER — Ambulatory Visit (HOSPITAL_COMMUNITY): Payer: Medicare Other | Attending: Internal Medicine | Admitting: Physical Therapy

## 2016-01-26 DIAGNOSIS — M6281 Muscle weakness (generalized): Secondary | ICD-10-CM | POA: Insufficient documentation

## 2016-01-26 DIAGNOSIS — S78112A Complete traumatic amputation at level between left hip and knee, initial encounter: Secondary | ICD-10-CM

## 2016-01-26 DIAGNOSIS — Z89612 Acquired absence of left leg above knee: Secondary | ICD-10-CM | POA: Diagnosis present

## 2016-01-26 DIAGNOSIS — R29898 Other symptoms and signs involving the musculoskeletal system: Secondary | ICD-10-CM

## 2016-01-26 DIAGNOSIS — R269 Unspecified abnormalities of gait and mobility: Secondary | ICD-10-CM | POA: Diagnosis not present

## 2016-01-26 DIAGNOSIS — R2689 Other abnormalities of gait and mobility: Secondary | ICD-10-CM | POA: Insufficient documentation

## 2016-01-26 NOTE — Therapy (Signed)
[facility] [address] [place], [name], [address] Phone: [phone]   Fax:  [phone]  Physical Therapy Treatment  Patient Details  Name: [name] MRN: [id number] Date of Birth: 1936-07-18 Referring Provider: Dr. Linton Ham  Encounter Date: 01/26/2016      PT End of Session - 01/26/16 1620    Visit Number 14   Number of Visits 24   Date for PT Re-Evaluation 02/13/16   Authorization Type medicare   Authorization Time Period g code by visit 61 ; KX by 15   Authorization - Visit Number 14   Authorization - Number of Visits 20   PT Start Time 1425   PT Stop Time 1525   PT Time Calculation (min) 60 min   Equipment Utilized During Treatment Gait belt   Activity Tolerance Patient tolerated treatment well   Behavior During Therapy [facility] for tasks assessed/performed      Past Medical History  Diagnosis Date  . Arteriosclerotic cardiovascular disease (ASCVD)     CABG in 1990s; 2002 total obstruction of LAD, CX and RCA with patent grafts and nl EF; Stress nuc. 2008 - mild LV dilation; normal EF; questionable small anteroapical scar; no ischemia  . Atrial fibrillation ([place])   . Chronic anticoagulation   . Arthritis     "left knee" (11/08/2015)  . Pedal edema     "not a lot", per pt.  . Diabetic peripheral neuropathy (HCC)     bilateral lower leg, hands  . Hypertension     states under control with meds., has been on med. x "long time"  . Peripheral vascular disease ([place])   . 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 Dental crowns present   . Runny nose 07/27/2015    clear drainage, per pt.  . Myocardial infarction ([place]) 1990s    "after my jaw OR"  . Type II diabetes mellitus ([place])     "diet controlled" (11/08/2015)  . Iron deficiency anemia   . History of blood transfusion     "related to my leg surgery?"  . Cellulitis 11/08/2015    "both feet"  . Jaw cancer ([place]) 1990s    "squamous cell"     Past Surgical History  Procedure Laterality Date  . Squamous cell carcinoma excision Left 1993    "took my jaw out; got cadavar in there now"  . Tonsillectomy    . Orif tibia fracture Left ~ 1970  . Colonoscopy N/A 11/27/2014    Procedure: COLONOSCOPY;  Surgeon: Rogene Houston, MD;  [facility];  Service: Endoscopy;  Laterality: N/A;  830  . Incision and drainage abscess Left 06/16/2005    wide exc. abscess 5th toe  . Mandibular hardware removal Left 08/02/2015    Procedure:  HARDWARE REMOVAL TWO MANDIBULAR SCREWS;  Surgeon: Jannette Fogo, DDS;  Location: [facility];  Service: Oral Surgery;  Laterality: Left;  . Fracture surgery    . Cataract extraction w/ intraocular lens  implant, bilateral Bilateral 09/2015  . Cardiac catheterization  1990s; 04/16/2001  . Coronary artery bypass graft  1990's    "CABG X4"  . I&d extremity Left 11/10/2015    Procedure: INCISION AND DRAINAGE LEFT FOOT, AMPUTATION OF LEFT THIRD TOE;  Surgeon: Conrad [place], MD;  Location: [facility];  Service: Vascular;  Laterality: Left;  . Amputation Left 11/11/2015    Procedure: LEFT ABOVE KNEE AMPUTATION;  Surgeon: Angelia Mould,  MD;  Location: [facility];  Service: Vascular;  Laterality: Left;    There were no vitals filed for this visit.  Visit Diagnosis:  Abnormality of gait  Bilateral leg weakness  Above knee amputation of left lower extremity (HCC)      Subjective Assessment - 01/26/16 1603    Subjective Pt states they shortened his prosthesis and now he feels off balance.  States it feels much better along the groin line though where they shaved it.    Currently in Pain? No/denies                         [facility] Adult PT Treatment/Exercise - 01/26/16 0001    Ambulation/Gait   Ambulation/Gait Yes   Ambulation/Gait Assistance 4: Min assist   Ambulation/Gait Assistance Details with Lt AKA prosthesis   Ambulation Distance (Feet) 110 Feet  110'X1, 75' X 1    Assistive device Hemi-walker   Stairs Yes   Stairs Assistance 4: Min assist   Stairs Assistance Details (indicate cue type and reason) sequencing   Stair Management Technique Two rails   Number of Stairs 4  2RT   Height of Stairs 7   Lumbar Exercises: Standing   Other Standing Lumbar Exercises side stepping with hemiwalker 2RT in front of mat   Lumbar Exercises: Seated   Sit to Stand 10 reps   Sit to Stand Limitations 2 sets of 5 reps, no UE's, no walker              Balance Exercises - 01/26/16 1614    Balance Exercises: Standing   Cone Rotation Solid surface;R/L;Limitations   Cone Rotation Limitations no UE assist in front of mat 2RT             PT Short Term Goals - 01/14/16 1455    PT SHORT TERM GOAL #1   Title Pt to be able to [name] and [name] independently   Time 2   Period Weeks   Status Achieved   PT SHORT TERM GOAL #2   Title Pt to be able to ambulate 800 ft with least assistive device with prothesis in 6 minutes to attend church and community activiies    Time 2   Period Weeks   Status On-going   PT SHORT TERM GOAL #3   Title Pt gluteal maximus mm to be at least 4/5 to allow pt to come from sit to stand with no UE assist to be able to sit in chairs without arms.    Time 2   Period Weeks   Status On-going           PT Long Term Goals - 01/14/16 1518    PT LONG TERM GOAL #1   Title Pt to be able to tolerate standing with no upper extremity support for 10 minutes to be able to sing in the choir again    Period Weeks   Status On-going   PT LONG TERM GOAL #2   Title Pt to be able to ambulate with his prothesis with a cane or least assisve device  for short distances to be able to have one hand free to carry coffee; or glass of water    Time 6   Period Weeks   Status On-going   PT LONG TERM GOAL #3   Title Pt to be able to ambulate with prothesis on uneven surfaces for 1000 feet with walker to allow pt to walk over uneven  ground to go to  grandson's baseball games    Time 8   Period Weeks   Status On-going   PT LONG TERM GOAL #4   Title Pt to be able to go up and down 4 steps with upper extremity suppport on railings to be able to exit buildings in case of emergency   Time 8   Period Weeks   Status On-going               Plan - 01/26/16 1620    Clinical Impression Statement Progressed functional actvities today adding stair negotiation, completing 4 7inch steps reciprocally with Bil HR's.  Pt able to complete with CGA only.  Challenged balance with cone rotation activity with minimal LOB, requiring min assist to complete during first rotation but able to complete without  LOB during second.  Pt overall improving with increasing confidence and stability.  Urged patient to contact Prosthetist regarding decreased height of prosthesis.     Pt will benefit from skilled therapeutic intervention in order to improve on the following deficits Abnormal gait;Decreased activity tolerance;Decreased balance;Decreased knowledge of use of DME;Decreased mobility;Decreased strength;Difficulty walking;Prosthetic Dependency   Rehab Potential Good   PT Frequency 3x / week   PT Duration 8 weeks   PT Treatment/Interventions ADLs/Self Care Home Management;Therapeutic activities;Therapeutic exercise;Balance training;Patient/family education;Prosthetic Training   PT Next Visit Plan continue with stair instructions and gait training.  Attempt large base quad cane.        Problem List Patient Active Problem List   Diagnosis Date Noted  . Weight gain 11/30/2015  . Coronary artery disease involving coronary bypass graft of native heart without angina pectoris   . Tachycardia   . Leukocytosis   . Abnormality of gait   . Status post above knee amputation of left lower extremity ([place])   . Acute blood loss anemia   . Acute pulmonary edema (HCC)   . Acute congestive heart failure ([place])   . Acute respiratory failure with hypoxia ([place])   .  Pressure ulcer 11/09/2015  . Necrotic toes ([place]) 11/08/2015  . Lower extremity cellulitis 11/08/2015  . Bradycardia 05/28/2015  . PVD (peripheral vascular disease) with claudication ([place]) 09/30/2014  . Encounter for therapeutic drug monitoring 11/26/2013  . Peripheral vascular disease ([place]) 08/06/2012  . Chronic anticoagulation 01/20/2011  . Diabetes mellitus ([place]) 03/04/2010  . Dyslipidemia 03/04/2010  . Essential hypertension 03/04/2010  . Hx of CABG 03/04/2010  . Permanent atrial fibrillation ([place]) 04/08/2009    Teena Irani, PTA/CLT [id number] 01/26/2016, 4:25 PM  [facility] [address] [place], [name], [address] Phone: [phone]   Fax:  [phone]  Name: BARRINGTON WORLEY MRN: [id number] Date of Birth: 1936/07/20

## 2016-01-28 ENCOUNTER — Ambulatory Visit (HOSPITAL_COMMUNITY): Payer: Medicare Other

## 2016-01-28 DIAGNOSIS — R2689 Other abnormalities of gait and mobility: Secondary | ICD-10-CM

## 2016-01-28 DIAGNOSIS — M6281 Muscle weakness (generalized): Secondary | ICD-10-CM

## 2016-01-28 DIAGNOSIS — R269 Unspecified abnormalities of gait and mobility: Secondary | ICD-10-CM | POA: Diagnosis not present

## 2016-01-28 NOTE — Therapy (Signed)
[facility] [address] [place], [name], [address] Phone: [phone]   Fax:  [phone]  Physical Therapy Treatment  Patient Details  Name: [name] MRN: [id number] Date of Birth: [birth year] Referring Provider: Dr. Linton Ham  Encounter Date: 01/28/2016      PT End of Session - 01/28/16 1452    Visit Number 15   Number of Visits 24   Date for PT Re-Evaluation 02/13/16   Authorization Type medicare   Authorization Time Period g code by visit 64 ; KX by 15   Authorization - Visit Number 15   Authorization - Number of Visits 20   PT Start Time 3151   PT Stop Time 1518   PT Time Calculation (min) 44 min   Equipment Utilized During Treatment Gait belt   Activity Tolerance Patient tolerated treatment well   Behavior During Therapy [facility] for tasks assessed/performed      Past Medical History  Diagnosis Date  . Arteriosclerotic cardiovascular disease (ASCVD)     CABG in 1990s; 2002 total obstruction of LAD, CX and RCA with patent grafts and nl EF; Stress nuc. 2008 - mild LV dilation; normal EF; questionable small anteroapical scar; no ischemia  . Atrial fibrillation ([place])   . Chronic anticoagulation   . Arthritis     "left knee" (11/08/2015)  . Pedal edema     "not a lot", per pt.  . Diabetic peripheral neuropathy (HCC)     bilateral lower leg, hands  . Hypertension     states under control with meds., has been on med. x "long time"  . Peripheral vascular disease ([place])   . 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 Dental crowns present   . Runny nose 07/27/2015    clear drainage, per pt.  . Myocardial infarction ([place]) 1990s    "after my jaw OR"  . Type II diabetes mellitus ([place])     "diet controlled" (11/08/2015)  . Iron deficiency anemia   . History of blood transfusion     "related to my leg surgery?"  . Cellulitis 11/08/2015    "both feet"  . Jaw cancer ([place]) 1990s    "squamous cell"     Past Surgical History  Procedure Laterality Date  . Squamous cell carcinoma excision Left 1993    "took my jaw out; got cadavar in there now"  . Tonsillectomy    . Orif tibia fracture Left ~ 1970  . Colonoscopy N/A 11/27/2014    Procedure: COLONOSCOPY;  Surgeon: Rogene Houston, MD;  [facility];  Service: Endoscopy;  Laterality: N/A;  830  . Incision and drainage abscess Left 06/16/2005    wide exc. abscess 5th toe  . Mandibular hardware removal Left 08/02/2015    Procedure:  HARDWARE REMOVAL TWO MANDIBULAR SCREWS;  Surgeon: Jannette Fogo, DDS;  Location: [facility];  Service: Oral Surgery;  Laterality: Left;  . Fracture surgery    . Cataract extraction w/ intraocular lens  implant, bilateral Bilateral 09/2015  . Cardiac catheterization  1990s; 04/16/2001  . Coronary artery bypass graft  1990's    "CABG X4"  . I&d extremity Left 11/10/2015    Procedure: INCISION AND DRAINAGE LEFT FOOT, AMPUTATION OF LEFT THIRD TOE;  Surgeon: Conrad [place], MD;  Location: [facility];  Service: Vascular;  Laterality: Left;  . Amputation Left 11/11/2015    Procedure: LEFT ABOVE KNEE AMPUTATION;  Surgeon: Angelia Mould,  MD;  Location: [facility];  Service: Vascular;  Laterality: Left;    There were no vitals filed for this visit.      Subjective Assessment - 01/28/16 1451    Subjective Pt stated he adjusted the height of prostesis and placed extra sock, stated it felt better.   Pertinent History atrial fibullation, HTN, OA, PVD, hx of skin cancer ,   Patient Stated Goals To be able to walk with his prothesis.    Currently in Pain? No/denies              [facility] Adult PT Treatment/Exercise - 01/28/16 0001    Ambulation/Gait   Ambulation/Gait Yes   Ambulation/Gait Assistance 4: Min assist   Ambulation/Gait Assistance Details with Lt AKA prothesis   Ambulation Distance (Feet) 452 Feet  2 sets   Assistive device Hemi-walker   Gait Pattern Step-through pattern;Decreased  stance time - left;Decreased step length - right   Ambulation Surface Level   Stairs Yes   Stairs Assistance 4: Min assist   Stairs Assistance Details (indicate cue type and reason) step to pattern; cueing for sequencing   Stair Management Technique Two rails   Number of Stairs 4   Height of Stairs 7   Lumbar Exercises: Seated   Sit to Stand 10 reps   Sit to Stand Limitations 2 sets of 5 reps, no UE's, no walker              Balance Exercises - 01/28/16 1758    Balance Exercises: Standing   Sidestepping --  Resume next session, mat not available this session   Cone Rotation Limitations Resume next session, mat not available this session   Lift / Chop Limitations lifting box to top shelf Bil UE             PT Short Term Goals - 01/14/16 1455    PT SHORT TERM GOAL #1   Title Pt to be able to [name] and [name] independently   Time 2   Period Weeks   Status Achieved   PT SHORT TERM GOAL #2   Title Pt to be able to ambulate 800 ft with least assistive device with prothesis in 6 minutes to attend church and community activiies    Time 2   Period Weeks   Status On-going   PT SHORT TERM GOAL #3   Title Pt gluteal maximus mm to be at least 4/5 to allow pt to come from sit to stand with no UE assist to be able to sit in chairs without arms.    Time 2   Period Weeks   Status On-going           PT Long Term Goals - 01/14/16 1518    PT LONG TERM GOAL #1   Title Pt to be able to tolerate standing with no upper extremity support for 10 minutes to be able to sing in the choir again    Period Weeks   Status On-going   PT LONG TERM GOAL #2   Title Pt to be able to ambulate with his prothesis with a cane or least assisve device  for short distances to be able to have one hand free to carry coffee; or glass of water    Time 6   Period Weeks   Status On-going   PT LONG TERM GOAL #3   Title Pt to be able to ambulate with prothesis on uneven surfaces for 1000 feet with  walker to  allow pt to walk over uneven ground to go to grandson's baseball games    Time 8   Period Weeks   Status On-going   PT LONG TERM GOAL #4   Title Pt to be able to go up and down 4 steps with upper extremity suppport on railings to be able to exit buildings in case of emergency   Time 8   Period Weeks   Status On-going               Plan - 01/28/16 1800    Clinical Impression Statement Gait training complete with hemiwalker this session due to extra cueing to increase stance phase Lt LE and increase step length with Rt LE, pt felt unsteady for LRAD this session.  Pt adjusted prothesis height yesterday and wore extra layer of sock this session and reports he feels equal leg length with gait this session.  Pt with min assistance required at turns this session due to unsteadiness.  Min to mod assistance required with sit to stand and cueing to increase weight bearing Lt LE.  Pt able to complete stair negotiation with Bil HR step to pattern with min guard.  Continues to demonstrate balance deficitis requiring assistance with no UE A.  End of session pt limited by fatigue, no reports of pain.   Rehab Potential Good   PT Frequency 3x / week   PT Duration 8 weeks   PT Treatment/Interventions ADLs/Self Care Home Management;Therapeutic activities;Therapeutic exercise;Balance training;Patient/family education;Prosthetic Training   PT Next Visit Plan continue with stair instructions and gait training.  Attempt large base quad cane.  Resume sidestepping, cone rotation with increased reaching outside BOS and sit to stand without UE A.      Patient will benefit from skilled therapeutic intervention in order to improve the following deficits and impairments:  Abnormal gait, Decreased activity tolerance, Decreased balance, Decreased knowledge of use of DME, Decreased mobility, Decreased strength, Difficulty walking, Prosthetic Dependency  Visit Diagnosis: Other abnormalities of gait and  mobility  Muscle weakness (generalized)     Problem List Patient Active Problem List   Diagnosis Date Noted  . Weight gain 11/30/2015  . Coronary artery disease involving coronary bypass graft of native heart without angina pectoris   . Tachycardia   . Leukocytosis   . Abnormality of gait   . Status post above knee amputation of left lower extremity ([place])   . Acute blood loss anemia   . Acute pulmonary edema (HCC)   . Acute congestive heart failure ([place])   . Acute respiratory failure with hypoxia ([place])   . Pressure ulcer 11/09/2015  . Necrotic toes ([place]) 11/08/2015  . Lower extremity cellulitis 11/08/2015  . Bradycardia 05/28/2015  . PVD (peripheral vascular disease) with claudication ([place]) 09/30/2014  . Encounter for therapeutic drug monitoring 11/26/2013  . Peripheral vascular disease ([place]) 08/06/2012  . Chronic anticoagulation 01/20/2011  . Diabetes mellitus ([place]) 03/04/2010  . Dyslipidemia 03/04/2010  . Essential hypertension 03/04/2010  . Hx of CABG 03/04/2010  . Permanent atrial fibrillation [facility]) 04/08/2009   Ihor Austin, [place]; [place]  Aldona Lento 01/28/2016, 6:13 PM  [facility] [address] [place], [name], [address] Phone: [phone]   Fax:  [phone]  Name: [name] MRN: [id number] Date of Birth: November 16, 1935

## 2016-01-31 ENCOUNTER — Ambulatory Visit (HOSPITAL_COMMUNITY): Payer: Medicare Other | Admitting: Physical Therapy

## 2016-01-31 DIAGNOSIS — M6281 Muscle weakness (generalized): Secondary | ICD-10-CM

## 2016-01-31 DIAGNOSIS — R269 Unspecified abnormalities of gait and mobility: Secondary | ICD-10-CM | POA: Diagnosis not present

## 2016-01-31 DIAGNOSIS — R2689 Other abnormalities of gait and mobility: Secondary | ICD-10-CM

## 2016-01-31 NOTE — Therapy (Signed)
[facility] [address] [place], [name], [address] Phone: [phone]   Fax:  [phone]  Physical Therapy Treatment  Patient Details  Name: [name] MRN: [id number] Date of Birth: 1935-11-20 Referring Provider: Dr. Linton Ham  Encounter Date: 01/31/2016      PT End of Session - 01/31/16 1523    Visit Number 16   Number of Visits 24   Date for PT Re-Evaluation 02/13/16   Authorization Type medicare   Authorization Time Period g code by visit 61 ; KX by 15   Authorization - Visit Number 16   Authorization - Number of Visits 20   PT Start Time 1435   PT Stop Time 1520   PT Time Calculation (min) 45 min   Equipment Utilized During Treatment Gait belt   Activity Tolerance Patient tolerated treatment well   Behavior During Therapy [facility] for tasks assessed/performed      Past Medical History  Diagnosis Date  . Arteriosclerotic cardiovascular disease (ASCVD)     CABG in 1990s; 2002 total obstruction of LAD, CX and RCA with patent grafts and nl EF; Stress nuc. 2008 - mild LV dilation; normal EF; questionable small anteroapical scar; no ischemia  . Atrial fibrillation ([place])   . Chronic anticoagulation   . Arthritis     "left knee" (11/08/2015)  . Pedal edema     "not a lot", per pt.  . Diabetic peripheral neuropathy (HCC)     bilateral lower leg, hands  . Hypertension     states under control with meds., has been on med. x "long time"  . Peripheral vascular disease ([place])   . 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 Dental crowns present   . Runny nose 07/27/2015    clear drainage, per pt.  . Myocardial infarction ([place]) 1990s    "after my jaw OR"  . Type II diabetes mellitus ([place])     "diet controlled" (11/08/2015)  . Iron deficiency anemia   . History of blood transfusion     "related to my leg surgery?"  . Cellulitis 11/08/2015    "both feet"  . Jaw cancer ([place]) 1990s    "squamous cell"     Past Surgical History  Procedure Laterality Date  . Squamous cell carcinoma excision Left 1993    "took my jaw out; got cadavar in there now"  . Tonsillectomy    . Orif tibia fracture Left ~ 1970  . Colonoscopy N/A 11/27/2014    Procedure: COLONOSCOPY;  Surgeon: Rogene Houston, MD;  [facility];  Service: Endoscopy;  Laterality: N/A;  830  . Incision and drainage abscess Left 06/16/2005    wide exc. abscess 5th toe  . Mandibular hardware removal Left 08/02/2015    Procedure:  HARDWARE REMOVAL TWO MANDIBULAR SCREWS;  Surgeon: Jannette Fogo, DDS;  Location: [facility];  Service: Oral Surgery;  Laterality: Left;  . Fracture surgery    . Cataract extraction w/ intraocular lens  implant, bilateral Bilateral 09/2015  . Cardiac catheterization  1990s; 04/16/2001  . Coronary artery bypass graft  1990's    "CABG X4"  . I&d extremity Left 11/10/2015    Procedure: INCISION AND DRAINAGE LEFT FOOT, AMPUTATION OF LEFT THIRD TOE;  Surgeon: Conrad Ione, MD;  Location: [facility];  Service: Vascular;  Laterality: Left;  . Amputation Left 11/11/2015    Procedure: LEFT ABOVE KNEE AMPUTATION;  Surgeon: Angelia Mould,  MD;  Location: [facility];  Service: Vascular;  Laterality: Left;    There were no vitals filed for this visit.      Subjective Assessment - 01/31/16 1531    Subjective pt states the prosthesis scratched his groin with the STS actvitiy last session.  He has a bandaid on it today.  No c/o pain .   Currently in Pain? No/denies                         [facility] Adult PT Treatment/Exercise - 01/31/16 1520    Ambulation/Gait   Ambulation/Gait Yes   Ambulation/Gait Assistance 4: Min assist   Ambulation/Gait Assistance Details Lt AKA prosthesis   Ambulation Distance (Feet) 200 Feet   Assistive device Hemi-walker   Gait Pattern Step-through pattern;Decreased stance time - left;Decreased step length - right   Ambulation Surface Level   Lumbar Exercises:  Seated   Sit to Stand 10 reps   Sit to Stand Limitations working on equal [employer]             Balance Exercises - 01/31/16 1522    Balance Exercises: Standing   SLS Upper extremity support 1;5 reps  SLS on Lt with HW.  10" max, cues to activate Lt glute   Cone Rotation Solid surface;R/L;Limitations   Cone Rotation Limitations No UE assist 12 cones 2RT             PT Short Term Goals - 01/14/16 1455    PT SHORT TERM GOAL #1   Title Pt to be able to [name] and [name] independently   Time 2   Period Weeks   Status Achieved   PT SHORT TERM GOAL #2   Title Pt to be able to ambulate 800 ft with least assistive device with prothesis in 6 minutes to attend church and community activiies    Time 2   Period Weeks   Status On-going   PT SHORT TERM GOAL #3   Title Pt gluteal maximus mm to be at least 4/5 to allow pt to come from sit to stand with no UE assist to be able to sit in chairs without arms.    Time 2   Period Weeks   Status On-going           PT Long Term Goals - 01/14/16 1518    PT LONG TERM GOAL #1   Title Pt to be able to tolerate standing with no upper extremity support for 10 minutes to be able to sing in the choir again    Period Weeks   Status On-going   PT LONG TERM GOAL #2   Title Pt to be able to ambulate with his prothesis with a cane or least assisve device  for short distances to be able to have one hand free to carry coffee; or glass of water    Time 6   Period Weeks   Status On-going   PT LONG TERM GOAL #3   Title Pt to be able to ambulate with prothesis on uneven surfaces for 1000 feet with walker to allow pt to walk over uneven ground to go to grandson's baseball games    Time 8   Period Weeks   Status On-going   PT LONG TERM GOAL #4   Title Pt to be able to go up and down 4 steps with upper extremity suppport on railings to be able to exit buildings in case of emergency  Time 8   Period Weeks   Status On-going                Plan - 01/31/16 1524    Clinical Impression Statement Pt comes today with  2 socks used with prosthesis.  Noted improvement in postural alignment, however patient reports it still feels a little short.  Improving confidence with ambulation noted today with improved gait speed and quality.  Continued with focus on improving weight bearing through Lt LE and overall stability.  Began SLS on Lt using HW on Rt with cues to activate LT glute instead of relying on Rt UE assist.  No pain or pinching of prosthesis into groin today with sit to stand.  Pt requires cues to even out weight distribution and keep back straight with sit to stand.  Overall improving with increased activity tolerance.     Rehab Potential Good   PT Frequency 3x / week   PT Duration 8 weeks   PT Treatment/Interventions ADLs/Self Care Home Management;Therapeutic activities;Therapeutic exercise;Balance training;Patient/family education;Prosthetic Training   PT Next Visit Plan continue with stair instructions and gait training.  Attempt large base quad cane.  Resume sidestepping and cone rotation with increased reaching outside BOS.      Patient will benefit from skilled therapeutic intervention in order to improve the following deficits and impairments:  Abnormal gait, Decreased activity tolerance, Decreased balance, Decreased knowledge of use of DME, Decreased mobility, Decreased strength, Difficulty walking, Prosthetic Dependency  Visit Diagnosis: Other abnormalities of gait and mobility  Muscle weakness (generalized)     Problem List Patient Active Problem List   Diagnosis Date Noted  . Weight gain 11/30/2015  . Coronary artery disease involving coronary bypass graft of native heart without angina pectoris   . Tachycardia   . Leukocytosis   . Abnormality of gait   . Status post above knee amputation of left lower extremity ([place])   . Acute blood loss anemia   . Acute pulmonary edema (HCC)   . Acute congestive heart  failure ([place])   . Acute respiratory failure with hypoxia ([place])   . Pressure ulcer 11/09/2015  . Necrotic toes ([employer]) 11/08/2015  . Lower extremity cellulitis 11/08/2015  . Bradycardia 05/28/2015  . PVD (peripheral vascular disease) with claudication ([place]) 09/30/2014  . Encounter for therapeutic drug monitoring 11/26/2013  . Peripheral vascular disease ([place]) 08/06/2012  . Chronic anticoagulation 01/20/2011  . Diabetes mellitus ([place]) 03/04/2010  . Dyslipidemia 03/04/2010  . Essential hypertension 03/04/2010  . Hx of CABG 03/04/2010  . Permanent atrial fibrillation ([place]) 04/08/2009    Teena Irani, PTA/CLT [phone]  01/31/2016, 3:31 PM  [facility] [address] [place], [name], [address] Phone: [phone]   Fax:  [phone]  Name: [name] MRN: [id number] Date of Birth: May 13, 1936

## 2016-02-02 ENCOUNTER — Ambulatory Visit (HOSPITAL_COMMUNITY): Payer: Medicare Other | Admitting: Physical Therapy

## 2016-02-02 DIAGNOSIS — M6281 Muscle weakness (generalized): Secondary | ICD-10-CM

## 2016-02-02 DIAGNOSIS — R2689 Other abnormalities of gait and mobility: Secondary | ICD-10-CM

## 2016-02-02 DIAGNOSIS — R269 Unspecified abnormalities of gait and mobility: Secondary | ICD-10-CM | POA: Diagnosis not present

## 2016-02-02 NOTE — Therapy (Signed)
[facility] [address] [place], [name], [address] Phone: [phone]   Fax:  [phone]  Physical Therapy Treatment  Patient Details  Name: [name] MRN: [id number] Date of Birth: 1936/08/21 Referring Provider: Dr. Linton Ham  Encounter Date: 02/02/2016      PT End of Session - 02/02/16 1522    Visit Number 17   Number of Visits 24   Date for PT Re-Evaluation 02/13/16   Authorization Type medicare   Authorization Time Period g code by visit 66 ; KX by 15   Authorization - Visit Number 17   Authorization - Number of Visits 20   PT Start Time 0973   PT Stop Time 1520   PT Time Calculation (min) 47 min   Equipment Utilized During Treatment Gait belt   Activity Tolerance Patient tolerated treatment well   Behavior During Therapy [facility] for tasks assessed/performed      Past Medical History  Diagnosis Date  . Arteriosclerotic cardiovascular disease (ASCVD)     CABG in 1990s; 2002 total obstruction of LAD, CX and RCA with patent grafts and nl EF; Stress nuc. 2008 - mild LV dilation; normal EF; questionable small anteroapical scar; no ischemia  . Atrial fibrillation ([place])   . Chronic anticoagulation   . Arthritis     "left knee" (11/08/2015)  . Pedal edema     "not a lot", per pt.  . Diabetic peripheral neuropathy (HCC)     bilateral lower leg, hands  . Hypertension     states under control with meds., has been on med. x "long time"  . Peripheral vascular disease ([place])   . 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 Dental crowns present   . Runny nose 07/27/2015    clear drainage, per pt.  . Myocardial infarction ([place]) 1990s    "after my jaw OR"  . Type II diabetes mellitus ([place])     "diet controlled" (11/08/2015)  . Iron deficiency anemia   . History of blood transfusion     "related to my leg surgery?"  . Cellulitis 11/08/2015    "both feet"  . Jaw cancer ([place]) 1990s    "squamous cell"     Past Surgical History  Procedure Laterality Date  . Squamous cell carcinoma excision Left 1993    "took my jaw out; got cadavar in there now"  . Tonsillectomy    . Orif tibia fracture Left ~ 1970  . Colonoscopy N/A 11/27/2014    Procedure: COLONOSCOPY;  Surgeon: Rogene Houston, MD;  [facility];  Service: Endoscopy;  Laterality: N/A;  830  . Incision and drainage abscess Left 06/16/2005    wide exc. abscess 5th toe  . Mandibular hardware removal Left 08/02/2015    Procedure:  HARDWARE REMOVAL TWO MANDIBULAR SCREWS;  Surgeon: Jannette Fogo, DDS;  Location: [place];  Service: Oral Surgery;  Laterality: Left;  . Fracture surgery    . Cataract extraction w/ intraocular lens  implant, bilateral Bilateral 09/2015  . Cardiac catheterization  1990s; 04/16/2001  . Coronary artery bypass graft  1990's    "CABG X4"  . I&d extremity Left 11/10/2015    Procedure: INCISION AND DRAINAGE LEFT FOOT, AMPUTATION OF LEFT THIRD TOE;  Surgeon: Conrad [place], MD;  Location: [facility];  Service: Vascular;  Laterality: Left;  . Amputation Left 11/11/2015    Procedure: LEFT ABOVE KNEE AMPUTATION;  Surgeon: Angelia Mould,  MD;  Location: [facility];  Service: Vascular;  Laterality: Left;    There were no vitals filed for this visit.      Subjective Assessment - 02/02/16 1439    Subjective Pt states that he feels that the prosthesis is short.  Has not tried adding additional socks yet.     Pertinent History atrial fibullation, HTN, OA, PVD, hx of skin cancer ,                         [facility] Adult PT Treatment/Exercise - 02/02/16 0001    Ambulation/Gait   Ambulation/Gait Yes   Ambulation/Gait Assistance 4: Min assist   Ambulation/Gait Assistance Details with L AKA prosthesis   Ambulation Distance (Feet) 200 Feet  100 with RW, then another 100 with HW   Assistive device Rolling walker;Hemi-walker   Gait Pattern Step-through pattern  vc's for increased R stride  length   Ambulation Surface Level   Pre-Gait Activities Lateral weight shifting in parallel bars with B UE support - fingertips, and A/P weight shifting with feet in wide semi-tandem.  3x38f in parallel bars with LBQC on R with Min A.    Knee/Hip Exercises: Seated   Long Arc Quad Strengthening;Both;1 set;10 reps   Long Arc Quad Weight 2 lbs.   Knee/Hip Exercises: Supine   Straight Leg Raises Strengthening;Both;1 set;10 reps  2# on the right only   Knee/Hip Exercises: Sidelying   Hip ABduction Strengthening;1 set;15 reps;Both  2# weight on R only                PT Education - 02/02/16 1520    Education provided Yes   Education Details Educated pt on importance of equal stride lengths and increasing WB through L LE to tolerance.  Started gait training with LBQC in parallel bars.  Pt educated on importance of strengthening hip musculature to assist with gait.    Person(s) Educated Patient   Methods Explanation   Comprehension Returned demonstration;Verbalized understanding          PT Short Term Goals - 01/14/16 1455    PT SHORT TERM GOAL #1   Title Pt to be able to DTimmothy Soursand [name] independently   Time 2   Period Weeks   Status Achieved   PT SHORT TERM GOAL #2   Title Pt to be able to ambulate 800 ft with least assistive device with prothesis in 6 minutes to attend church and community activiies    Time 2   Period Weeks   Status On-going   PT SHORT TERM GOAL #3   Title Pt gluteal maximus mm to be at least 4/5 to allow pt to come from sit to stand with no UE assist to be able to sit in chairs without arms.    Time 2   Period Weeks   Status On-going           PT Long Term Goals - 01/14/16 1518    PT LONG TERM GOAL #1   Title Pt to be able to tolerate standing with no upper extremity support for 10 minutes to be able to sing in the choir again    Period Weeks   Status On-going   PT LONG TERM GOAL #2   Title Pt to be able to ambulate with his prothesis  with a cane or least assisve device  for short distances to be able to have one hand free to carry coffee; or glass  of water    Time 6   Period Weeks   Status On-going   PT LONG TERM GOAL #3   Title Pt to be able to ambulate with prothesis on uneven surfaces for 1000 feet with walker to allow pt to walk over uneven ground to go to grandson's baseball games    Time 8   Period Weeks   Status On-going   PT LONG TERM GOAL #4   Title Pt to be able to go up and down 4 steps with upper extremity suppport on railings to be able to exit buildings in case of emergency   Time 8   Period Weeks   Status On-going               Plan - 02/02/16 1524    Clinical Impression Statement Pt came today with only 1 sock with prosthesis.  Pt continues to feel that his prosthesis is shorter than the R LE, however pt continues to demonstrate increased weight shift to the right in standing and gait.  Pt is doing well with equalizing stride length using RW, but continues to demonstrate shortened stride on the right when using HW.  Continued to focus on hip strengthening in supine and sidelying with added cuff weights.  While pt continues to require cues for even weight distribution, he is demonstrating improvement with stride length and L LE weight bearing.    Rehab Potential Good   PT Frequency 3x / week   PT Duration 8 weeks   PT Treatment/Interventions ADLs/Self Care Home Management;Therapeutic activities;Therapeutic exercise;Balance training;Patient/family education;Prosthetic Training   PT Next Visit Plan Continue gait training with [facility], as well as continued hip strengthening.  Side stepping and cone rotation with increased reaching outside BOS.    PT Home Exercise Plan modify HEP as needed.    Consulted and Agree with Plan of Care Patient      Patient [name] benefit from skilled therapeutic intervention in order to improve the following deficits and impairments:  Abnormal gait, Decreased activity  tolerance, Decreased balance, Decreased knowledge of use of DME, Decreased mobility, Decreased strength, Difficulty walking, Prosthetic Dependency  Visit Diagnosis: Other abnormalities of gait and mobility  Muscle weakness (generalized)     Problem List Patient Active Problem List   Diagnosis Date Noted  . Weight gain 11/30/2015  . Coronary artery disease involving coronary bypass graft of native heart without angina pectoris   . Tachycardia   . Leukocytosis   . Abnormality of gait   . Status post above knee amputation of left lower extremity ([place])   . Acute blood loss anemia   . Acute pulmonary edema (HCC)   . Acute congestive heart failure ([place])   . Acute respiratory failure with hypoxia ([place])   . Pressure ulcer 11/09/2015  . Necrotic toes ([place]) 11/08/2015  . Lower extremity cellulitis 11/08/2015  . Bradycardia 05/28/2015  . PVD (peripheral vascular disease) with claudication ([place]) 09/30/2014  . Encounter for therapeutic drug monitoring 11/26/2013  . Peripheral vascular disease ([place]) 08/06/2012  . Chronic anticoagulation 01/20/2011  . Diabetes mellitus ([place]) 03/04/2010  . Dyslipidemia 03/04/2010  . Essential hypertension 03/04/2010  . Hx of CABG 03/04/2010  . Permanent atrial fibrillation [facility]) 04/08/2009     [facility] [place], [name], [address] Phone: [phone]   Fax:  [phone]  Name: [name] MRN: [id number] Date of Birth: 11-07-1935    Tacy Learn, PT, DPT  Deniece Ree PT, DPT  [phone]  

## 2016-02-04 ENCOUNTER — Ambulatory Visit (HOSPITAL_COMMUNITY): Payer: Medicare Other

## 2016-02-04 DIAGNOSIS — S78112A Complete traumatic amputation at level between left hip and knee, initial encounter: Secondary | ICD-10-CM

## 2016-02-04 DIAGNOSIS — M6281 Muscle weakness (generalized): Secondary | ICD-10-CM

## 2016-02-04 DIAGNOSIS — R29898 Other symptoms and signs involving the musculoskeletal system: Secondary | ICD-10-CM

## 2016-02-04 DIAGNOSIS — R2689 Other abnormalities of gait and mobility: Secondary | ICD-10-CM

## 2016-02-04 DIAGNOSIS — R269 Unspecified abnormalities of gait and mobility: Secondary | ICD-10-CM

## 2016-02-04 NOTE — Patient Instructions (Signed)
Abduction: Side Leg Lift (Eccentric) - Side-Lying    Lie on side. Lift top leg slightly higher than shoulder level. Keep top leg straight with body, toes pointing forward. Slowly lower for 3-5 seconds. _10__ reps per set, _2-3__ sets per day, _5_ days per week. Add _3__ lbs.  http://ecce.exer.[email or web address]  PRE GAIT: Standing Weight Shift    Stand inside the walker with upright posture, shift weight from side to side. _10__ reps per set, _2-3__ sets per day, _5__ days per week Hold onto a support.  Copyright  VHI. All rights reserved.   Copyright  VHI. All rights reserved.

## 2016-02-04 NOTE — Therapy (Signed)
[facility] [address] [place], [name], [address] Phone: [phone]   Fax:  [phone]  Physical Therapy Treatment  Patient Details  Name: [name] MRN: [id number] Date of Birth: 1936/09/24 Referring Provider: Dr. Linton Ham  Encounter Date: 02/04/2016      PT End of Session - 02/04/16 1529    Visit Number 18   Number of Visits 24   Date for PT Re-Evaluation 02/13/16   Authorization Type medicare   Authorization Time Period g code by visit 76 ; KX by 15   Authorization - Visit Number 18   Authorization - Number of Visits 20   PT Start Time 1435   PT Stop Time 1515   PT Time Calculation (min) 40 min   Equipment Utilized During Treatment Gait belt   Activity Tolerance Patient tolerated treatment well;Patient limited by fatigue   Behavior During Therapy [facility] for tasks assessed/performed      Past Medical History  Diagnosis Date  . Arteriosclerotic cardiovascular disease (ASCVD)     CABG in 1990s; 2002 total obstruction of LAD, CX and RCA with patent grafts and nl EF; Stress nuc. 2008 - mild LV dilation; normal EF; questionable small anteroapical scar; no ischemia  . Atrial fibrillation ([place])   . Chronic anticoagulation   . Arthritis     "left knee" (11/08/2015)  . Pedal edema     "not a lot", per pt.  . Diabetic peripheral neuropathy (HCC)     bilateral lower leg, hands  . Hypertension     states under control with meds., has been on med. x "long time"  . Peripheral vascular disease ([place])   . 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 Dental crowns present   . Runny nose 07/27/2015    clear drainage, per pt.  . Myocardial infarction ([place]) 1990s    "after my jaw OR"  . Type II diabetes mellitus ([place])     "diet controlled" (11/08/2015)  . Iron deficiency anemia   . History of blood transfusion     "related to my leg surgery?"  . Cellulitis 11/08/2015    "both feet"  . Jaw cancer ([place])  1990s    "squamous cell"    Past Surgical History  Procedure Laterality Date  . Squamous cell carcinoma excision Left 1993    "took my jaw out; got cadavar in there now"  . Tonsillectomy    . Orif tibia fracture Left ~ 1970  . Colonoscopy N/A 11/27/2014    Procedure: COLONOSCOPY;  Surgeon: Rogene Houston, MD;  [facility];  Service: Endoscopy;  Laterality: N/A;  830  . Incision and drainage abscess Left 06/16/2005    wide exc. abscess 5th toe  . Mandibular hardware removal Left 08/02/2015    Procedure:  HARDWARE REMOVAL TWO MANDIBULAR SCREWS;  Surgeon: Jannette Fogo, DDS;  Location: [facility];  Service: Oral Surgery;  Laterality: Left;  . Fracture surgery    . Cataract extraction w/ intraocular lens  implant, bilateral Bilateral 09/2015  . Cardiac catheterization  1990s; 04/16/2001  . Coronary artery bypass graft  1990's    "CABG X4"  . I&d extremity Left 11/10/2015    Procedure: INCISION AND DRAINAGE LEFT FOOT, AMPUTATION OF LEFT THIRD TOE;  Surgeon: Conrad [place], MD;  Location: [facility];  Service: Vascular;  Laterality: Left;  . Amputation Left 11/11/2015    Procedure: LEFT ABOVE KNEE AMPUTATION;  Surgeon:  Angelia Mould, MD;  Location: [facility];  Service: Vascular;  Laterality: Left;    There were no vitals filed for this visit.      Subjective Assessment - 02/04/16 1437    Subjective Pt states that he felt unsteady when ambulating with the QC last time.  Pt states that he still feels like the L LE is shorter than the right.    Pertinent History atrial fibullation, HTN, OA, PVD, hx of skin cancer ,   Patient Stated Goals To be able to walk with his prothesis.    Currently in Pain? No/denies             [facility] Adult PT Treatment/Exercise - 02/04/16 0001    Ambulation/Gait   Ambulation/Gait Yes   Ambulation/Gait Assistance 3: Mod assist;4: Min assist   Ambulation/Gait Assistance Details with L AKA prosthesis   Ambulation Distance (Feet) 160 Feet    Assistive device Hemi-walker  on the right   Gait Pattern Step-to pattern;Step-through pattern;Decreased stance time - left  focus on larger R stride.    Ambulation Surface Level   Gait Comments Pt needed chair pulled up due to fatigue and c/o Lt LE too short, then ambulated another 59f with HW and Min A to the mat   Knee/Hip Exercises: Standing   Other Standing Knee Exercises Lateral weight shifting 15x no HHA   Other Standing Knee Exercises Reaching outside BOS 10x each direction; Punches 2 sets x 15 several directions for balance training   Knee/Hip Exercises: Seated   Other Seated Knee/Hip Exercises removed prosthesis and placed sock, adjusted to proper fit prior gait training   Knee/Hip Exercises: Sidelying   Hip ABduction Strengthening;1 set;15 reps;Both  3# Bil LE   Hip ABduction Limitations HEP given             PT Education - 02/04/16 1518    Education provided Yes   Education Details Continued to educate pt on equal weight bearing in static and dynamic positions, and continued progression of increasing stride length on the right with hemiwalker.  Pt educated on importance of proper posture with gait and standing exercises.            PT Short Term Goals - 01/14/16 1455    PT SHORT TERM GOAL #1   Title Pt to be able to DTimmothy Soursand [name] independently   Time 2   Period Weeks   Status Achieved   PT SHORT TERM GOAL #2   Title Pt to be able to ambulate 800 ft with least assistive device with prothesis in 6 minutes to attend church and community activiies    Time 2   Period Weeks   Status On-going   PT SHORT TERM GOAL #3   Title Pt gluteal maximus mm to be at least 4/5 to allow pt to come from sit to stand with no UE assist to be able to sit in chairs without arms.    Time 2   Period Weeks   Status On-going           PT Long Term Goals - 01/14/16 1518    PT LONG TERM GOAL #1   Title Pt to be able to tolerate standing with no upper extremity support  for 10 minutes to be able to sing in the choir again    Period Weeks   Status On-going   PT LONG TERM GOAL #2   Title Pt to be able to ambulate with his prothesis  with a cane or least assisve device  for short distances to be able to have one hand free to carry coffee; or glass of water    Time 6   Period Weeks   Status On-going   PT LONG TERM GOAL #3   Title Pt to be able to ambulate with prothesis on uneven surfaces for 1000 feet with walker to allow pt to walk over uneven ground to go to grandson's baseball games    Time 8   Period Weeks   Status On-going   PT LONG TERM GOAL #4   Title Pt to be able to go up and down 4 steps with upper extremity suppport on railings to be able to exit buildings in case of emergency   Time 8   Period Weeks   Status On-going               Plan - 02/04/16 1532    Clinical Impression Statement Pt entered dept stateing he was unable to get sock on limb today, noted increased ER of prosthesis.  Began session with adjusted prosthesis to improve gait mechanics and reduce any rubbing of skin, able to place 1 sock on limb prior gait training.  Gait training complete with hemiwalker with min assistance required and cueing to increase Rt stride length and increase weight bearing on Lt.  Pt continues to express feeling like his L prosthesis is shorter than the R LE.  PT continued to work on B hip strength, as well as standing lateral weight shifting.  Would like to progress pt with increasing R stride length during gait.  Progressed HEP with sidelying hip abduction, and standing right/left weight shifting.  Pt reports plans to have prosthesis specialist come to house next week to make adjustments with height and to reduce rubbing near groin.  Pt stated no skin irritation current with Lt limb.   Rehab Potential Good   PT Frequency 3x / week   PT Duration 8 weeks   PT Treatment/Interventions ADLs/Self Care Home Management;Therapeutic activities;Therapeutic  exercise;Balance training;Patient/family education;Prosthetic Training   PT Next Visit Plan Continued focus on equal weight bearing on both LE's, reaching outside BOS, and  B hip strengthening,    PT Home Exercise Plan Added right and left weight shifting, as well as sidelying hip abduction   Consulted and Agree with Plan of Care Patient      Patient will benefit from skilled therapeutic intervention in order to improve the following deficits and impairments:  Abnormal gait, Decreased activity tolerance, Decreased balance, Decreased knowledge of use of DME, Decreased mobility, Decreased strength, Difficulty walking, Prosthetic Dependency  Visit Diagnosis: Other abnormalities of gait and mobility  Muscle weakness (generalized)  Abnormality of gait  Bilateral leg weakness  Above knee amputation of left lower extremity [facility])     Problem List Patient Active Problem List   Diagnosis Date Noted  . Weight gain 11/30/2015  . Coronary artery disease involving coronary bypass graft of native heart without angina pectoris   . Tachycardia   . Leukocytosis   . Abnormality of gait   . Status post above knee amputation of left lower extremity ([place])   . Acute blood loss anemia   . Acute pulmonary edema (HCC)   . Acute congestive heart failure ([place])   . Acute respiratory failure with hypoxia ([place])   . Pressure ulcer 11/09/2015  . Necrotic toes ([place]) 11/08/2015  . Lower extremity cellulitis 11/08/2015  . Bradycardia 05/28/2015  . PVD (peripheral vascular  disease) with claudication ([place]) 09/30/2014  . Encounter for therapeutic drug monitoring 11/26/2013  . Peripheral vascular disease ([place]) 08/06/2012  . Chronic anticoagulation 01/20/2011  . Diabetes mellitus ([place]) 03/04/2010  . Dyslipidemia 03/04/2010  . Essential hypertension 03/04/2010  . Hx of CABG 03/04/2010  . Permanent atrial fibrillation [facility]) 04/08/2009   [place], [place]; [place]  Aldona Lento 02/04/2016, 5:39 PM  [facility] [address] [place], [name], [address] Phone: [phone]   Fax:  [phone]  Name: JARRIN STALEY MRN: [id number] Date of Birth: 02-25-1936

## 2016-02-07 ENCOUNTER — Encounter (HOSPITAL_COMMUNITY): Payer: Medicare Other | Admitting: Physical Therapy

## 2016-02-09 ENCOUNTER — Ambulatory Visit (INDEPENDENT_AMBULATORY_CARE_PROVIDER_SITE_OTHER): Payer: Medicare Other | Admitting: *Deleted

## 2016-02-09 DIAGNOSIS — Z5181 Encounter for therapeutic drug level monitoring: Secondary | ICD-10-CM | POA: Diagnosis not present

## 2016-02-09 DIAGNOSIS — I4821 Permanent atrial fibrillation: Secondary | ICD-10-CM

## 2016-02-09 DIAGNOSIS — I482 Chronic atrial fibrillation: Secondary | ICD-10-CM

## 2016-02-09 LAB — POCT INR: INR: 2.1

## 2016-02-16 ENCOUNTER — Ambulatory Visit (HOSPITAL_COMMUNITY): Payer: Medicare Other | Admitting: Physical Therapy

## 2016-02-16 DIAGNOSIS — M6281 Muscle weakness (generalized): Secondary | ICD-10-CM

## 2016-02-16 DIAGNOSIS — R29898 Other symptoms and signs involving the musculoskeletal system: Secondary | ICD-10-CM

## 2016-02-16 DIAGNOSIS — R269 Unspecified abnormalities of gait and mobility: Secondary | ICD-10-CM | POA: Diagnosis not present

## 2016-02-16 DIAGNOSIS — R2689 Other abnormalities of gait and mobility: Secondary | ICD-10-CM

## 2016-02-16 DIAGNOSIS — S78112A Complete traumatic amputation at level between left hip and knee, initial encounter: Secondary | ICD-10-CM

## 2016-02-16 NOTE — Therapy (Signed)
[facility] [address] [place], [name], [address] Phone: [phone]   Fax:  [phone]  Physical Therapy Treatment  Patient Details  Name: [name] MRN: [id number] Date of Birth: [birth year] Referring Provider: Dr. Linton Ham  Encounter Date: 02/16/2016      PT End of Session - 02/16/16 1800    Visit Number 19   Number of Visits 24   Date for PT Re-Evaluation 02/13/16   Authorization Type medicare   Authorization Time Period g code by visit 71 ; KX by 15   Authorization - Visit Number 19   Authorization - Number of Visits 20   PT Start Time 1435   PT Stop Time 1515   PT Time Calculation (min) 40 min   Equipment Utilized During Treatment Gait belt   Activity Tolerance Patient tolerated treatment well;Patient limited by fatigue   Behavior During Therapy [facility] for tasks assessed/performed      Past Medical History  Diagnosis Date  . Arteriosclerotic cardiovascular disease (ASCVD)     CABG in 1990s; 2002 total obstruction of LAD, CX and RCA with patent grafts and nl EF; Stress nuc. 2008 - mild LV dilation; normal EF; questionable small anteroapical scar; no ischemia  . Atrial fibrillation ([place])   . Chronic anticoagulation   . Arthritis     "left knee" (11/08/2015)  . Pedal edema     "not a lot", per pt.  . Diabetic peripheral neuropathy (HCC)     bilateral lower leg, hands  . Hypertension     states under control with meds., has been on med. x "long time"  . Peripheral vascular disease ([place])   . 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 Dental crowns present   . Runny nose 07/27/2015    clear drainage, per pt.  . Myocardial infarction ([place]) 1990s    "after my jaw OR"  . Type II diabetes mellitus ([place])     "diet controlled" (11/08/2015)  . Iron deficiency anemia   . History of blood transfusion     "related to my leg surgery?"  . Cellulitis 11/08/2015    "both feet"  . Jaw cancer ([place])  1990s    "squamous cell"    Past Surgical History  Procedure Laterality Date  . Squamous cell carcinoma excision Left 1993    "took my jaw out; got cadavar in there now"  . Tonsillectomy    . Orif tibia fracture Left ~ 1970  . Colonoscopy N/A 11/27/2014    Procedure: COLONOSCOPY;  Surgeon: Rogene Houston, MD;  [facility];  Service: Endoscopy;  Laterality: N/A;  830  . Incision and drainage abscess Left 06/16/2005    wide exc. abscess 5th toe  . Mandibular hardware removal Left 08/02/2015    Procedure:  HARDWARE REMOVAL TWO MANDIBULAR SCREWS;  Surgeon: Jannette Fogo, DDS;  Location: [facility];  Service: Oral Surgery;  Laterality: Left;  . Fracture surgery    . Cataract extraction w/ intraocular lens  implant, bilateral Bilateral 09/2015  . Cardiac catheterization  1990s; 04/16/2001  . Coronary artery bypass graft  1990's    "CABG X4"  . I&d extremity Left 11/10/2015    Procedure: INCISION AND DRAINAGE LEFT FOOT, AMPUTATION OF LEFT THIRD TOE;  Surgeon: Conrad [place], MD;  Location: [facility];  Service: Vascular;  Laterality: Left;  . Amputation Left 11/11/2015    Procedure: LEFT ABOVE KNEE AMPUTATION;  Surgeon:  Angelia Mould, MD;  Location: [facility];  Service: Vascular;  Laterality: Left;    There were no vitals filed for this visit.      Subjective Assessment - 02/16/16 1758    Subjective PT states he is doing better today but very weak.  STates he had the flu last week and that is why he has not been here.  Comes today using RW.  No pain reported.   Currently in Pain? No/denies                         [facility] Adult PT Treatment/Exercise - 02/16/16 0001    Ambulation/Gait   Ambulation/Gait Yes   Ambulation/Gait Assistance 3: Mod assist;4: Min assist   Ambulation/Gait Assistance Details with Lt AKA   Ambulation Distance (Feet) 200 Feet  3X65 feet   Assistive device Hemi-walker   Gait Pattern Step-to pattern;Step-through pattern;Decreased  stance time - left   Ambulation Surface Level   Lumbar Exercises: Seated   Sit to Stand 10 reps   Sit to Stand Limitations working on equal WB                  PT Short Term Goals - 01/14/16 1455    PT SHORT TERM GOAL #1   Title Pt to be able to [name] and [name] independently   Time 2   Period Weeks   Status Achieved   PT SHORT TERM GOAL #2   Title Pt to be able to ambulate 800 ft with least assistive device with prothesis in 6 minutes to attend church and community activiies    Time 2   Period Weeks   Status On-going   PT SHORT TERM GOAL #3   Title Pt gluteal maximus mm to be at least 4/5 to allow pt to come from sit to stand with no UE assist to be able to sit in chairs without arms.    Time 2   Period Weeks   Status On-going           PT Long Term Goals - 01/14/16 1518    PT LONG TERM GOAL #1   Title Pt to be able to tolerate standing with no upper extremity support for 10 minutes to be able to sing in the choir again    Period Weeks   Status On-going   PT LONG TERM GOAL #2   Title Pt to be able to ambulate with his prothesis with a cane or least assisve device  for short distances to be able to have one hand free to carry coffee; or glass of water    Time 6   Period Weeks   Status On-going   PT LONG TERM GOAL #3   Title Pt to be able to ambulate with prothesis on uneven surfaces for 1000 feet with walker to allow pt to walk over uneven ground to go to grandson's baseball games    Time 8   Period Weeks   Status On-going   PT LONG TERM GOAL #4   Title Pt to be able to go up and down 4 steps with upper extremity suppport on railings to be able to exit buildings in case of emergency   Time 8   Period Weeks   Status On-going               Plan - 02/16/16 1800    Clinical Impression Statement Pt with noted fatigue and weakness today  due to flu illness last week.  Pt able to complete therex with focus on gait and pregait actvities.  Pt  required frequent rest breaks during session, walking approximately 65 feet max before needing a rest break.  Improved Lt quad activation noted with sit to stand actvity today.  Pt with noted perspiration during session.     Rehab Potential Good   PT Frequency 3x / week   PT Duration 8 weeks   PT Treatment/Interventions ADLs/Self Care Home Management;Therapeutic activities;Therapeutic exercise;Balance training;Patient/family education;Prosthetic Training   PT Next Visit Plan Continued focus on equal weight bearing on both LE's, reaching outside BOS, and  B hip strengthening.  gcodes next session.   Consulted and Agree with Plan of Care Patient      Patient will benefit from skilled therapeutic intervention in order to improve the following deficits and impairments:  Abnormal gait, Decreased activity tolerance, Decreased balance, Decreased knowledge of use of DME, Decreased mobility, Decreased strength, Difficulty walking, Prosthetic Dependency  Visit Diagnosis: Other abnormalities of gait and mobility  Muscle weakness (generalized)  Abnormality of gait  Bilateral leg weakness  Above knee amputation of left lower extremity [facility])     Problem List Patient Active Problem List   Diagnosis Date Noted  . Weight gain 11/30/2015  . Coronary artery disease involving coronary bypass graft of native heart without angina pectoris   . Tachycardia   . Leukocytosis   . Abnormality of gait   . Status post above knee amputation of left lower extremity ([place])   . Acute blood loss anemia   . Acute pulmonary edema (HCC)   . Acute congestive heart failure ([place])   . Acute respiratory failure with hypoxia ([place])   . Pressure ulcer 11/09/2015  . Necrotic toes ([place]) 11/08/2015  . Lower extremity cellulitis 11/08/2015  . Bradycardia 05/28/2015  . PVD (peripheral vascular disease) with claudication ([place]) 09/30/2014  . Encounter for therapeutic drug monitoring 11/26/2013  . Peripheral vascular disease  ([place]) 08/06/2012  . Chronic anticoagulation 01/20/2011  . Diabetes mellitus ([place]) 03/04/2010  . Dyslipidemia 03/04/2010  . Essential hypertension 03/04/2010  . Hx of CABG 03/04/2010  . Permanent atrial fibrillation ([place]) 04/08/2009    Teena Irani, PTA/CLT [phone]  02/16/2016, 6:06 PM  [facility] [address] [place], [name], [address] Phone: [phone]   Fax:  [phone]  Name: [name] MRN: [id number] Date of Birth: 05/15/1936

## 2016-02-21 ENCOUNTER — Ambulatory Visit (HOSPITAL_COMMUNITY): Payer: Medicare Other | Attending: Internal Medicine | Admitting: Physical Therapy

## 2016-02-21 DIAGNOSIS — R2689 Other abnormalities of gait and mobility: Secondary | ICD-10-CM

## 2016-02-21 DIAGNOSIS — M6281 Muscle weakness (generalized): Secondary | ICD-10-CM | POA: Diagnosis present

## 2016-02-21 NOTE — Therapy (Addendum)
[facility] [address] [place], [name], [address] Phone: [phone]   Fax:  [phone]  Physical Therapy Treatment (G-codes done)  Patient Details  Name: [name] MRN: [id number] Date of Birth: [birth year] Referring Provider: Dr. Linton Ham  Encounter Date: 02/21/2016      PT End of Session - 02/21/16 1906    Visit Number 20   Number of Visits 24   Date for PT Re-Evaluation 03/02/16   Authorization Type medicare   Authorization Time Period g code by visit 30, cert 9/9-3/7   Authorization - Visit Number 20   Authorization - Number of Visits 30   PT Start Time 1696   PT Stop Time 1515   PT Time Calculation (min) 40 min   Equipment Utilized During Treatment Gait belt   Activity Tolerance Patient tolerated treatment well;Patient limited by fatigue   Behavior During Therapy [facility] for tasks assessed/performed      Past Medical History  Diagnosis Date  . Arteriosclerotic cardiovascular disease (ASCVD)     CABG in 1990s; 2002 total obstruction of LAD, CX and RCA with patent grafts and nl EF; Stress nuc. 2008 - mild LV dilation; normal EF; questionable small anteroapical scar; no ischemia  . Atrial fibrillation ([place])   . Chronic anticoagulation   . Arthritis     "left knee" (11/08/2015)  . Pedal edema     "not a lot", per pt.  . Diabetic peripheral neuropathy (HCC)     bilateral lower leg, hands  . Hypertension     states under control with meds., has been on med. x "long time"  . Peripheral vascular disease ([place])   . 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 Dental crowns present   . Runny nose 07/27/2015    clear drainage, per pt.  . Myocardial infarction ([place]) 1990s    "after my jaw OR"  . Type II diabetes mellitus ([place])     "diet controlled" (11/08/2015)  . Iron deficiency anemia   . History of blood transfusion     "related to my leg surgery?"  . Cellulitis 11/08/2015    "both feet"  .  Jaw cancer ([place]) 1990s    "squamous cell"    Past Surgical History  Procedure Laterality Date  . Squamous cell carcinoma excision Left 1993    "took my jaw out; got cadavar in there now"  . Tonsillectomy    . Orif tibia fracture Left ~ 1970  . Colonoscopy N/A 11/27/2014    Procedure: COLONOSCOPY;  Surgeon: Rogene Houston, MD;  [facility];  Service: Endoscopy;  Laterality: N/A;  830  . Incision and drainage abscess Left 06/16/2005    wide exc. abscess 5th toe  . Mandibular hardware removal Left 08/02/2015    Procedure:  HARDWARE REMOVAL TWO MANDIBULAR SCREWS;  Surgeon: Jannette Fogo, DDS;  Location: [facility];  Service: Oral Surgery;  Laterality: Left;  . Fracture surgery    . Cataract extraction w/ intraocular lens  implant, bilateral Bilateral 09/2015  . Cardiac catheterization  1990s; 04/16/2001  . Coronary artery bypass graft  1990's    "CABG X4"  . I&d extremity Left 11/10/2015    Procedure: INCISION AND DRAINAGE LEFT FOOT, AMPUTATION OF LEFT THIRD TOE;  Surgeon: Conrad Fossil, MD;  Location: [facility];  Service: Vascular;  Laterality: Left;  . Amputation Left 11/11/2015    Procedure: LEFT ABOVE KNEE AMPUTATION;  Surgeon:  Angelia Mould, MD;  Location: [facility];  Service: Vascular;  Laterality: Left;    There were no vitals filed for this visit.      Subjective Assessment - 02/21/16 1902    Subjective PT states he can tell he is getting stronger.  States he uses his scooter if he's going a long distance or on uneven terrain.     Currently in Pain? No/denies                              Balance Exercises - 02/21/16 1904    Balance Exercises: Standing   Lift / Chop Limitations lifting massage cream container to top shelf in cabinets alternating UE's with perterbations             PT Short Term Goals - 01/14/16 1455    PT SHORT TERM GOAL #1   Title Pt to be able to [name] and [name] independently   Time 2   Period  Weeks   Status Achieved   PT SHORT TERM GOAL #2   Title Pt to be able to ambulate 800 ft with least assistive device with prothesis in 6 minutes to attend church and community activiies    Time 2   Period Weeks   Status On-going   PT SHORT TERM GOAL #3   Title Pt gluteal maximus mm to be at least 4/5 to allow pt to come from sit to stand with no UE assist to be able to sit in chairs without arms.    Time 2   Period Weeks   Status On-going           PT Long Term Goals - 01/14/16 1518    PT LONG TERM GOAL #1   Title Pt to be able to tolerate standing with no upper extremity support for 10 minutes to be able to sing in the choir again    Period Weeks   Status On-going   PT LONG TERM GOAL #2   Title Pt to be able to ambulate with his prothesis with a cane or least assisve device  for short distances to be able to have one hand free to carry coffee; or glass of water    Time 6   Period Weeks   Status On-going   PT LONG TERM GOAL #3   Title Pt to be able to ambulate with prothesis on uneven surfaces for 1000 feet with walker to allow pt to walk over uneven ground to go to grandson's baseball games    Time 8   Period Weeks   Status On-going   PT LONG TERM GOAL #4   Title Pt to be able to go up and down 4 steps with upper extremity suppport on railings to be able to exit buildings in case of emergency   Time 8   Period Weeks   Status On-going               Plan - 02/21/16 1911    Clinical Impression Statement Continued focus on improving functional indepence.  Pt able to ambulate further today using Hemiwalker for distance of 174 feet before needing a rest break.  completed stairs with improved ease in step to pattern.  Added side stepping at sink without AD or use of UE's.  This was challenging for patient but able to complete.  Noted fatigue at end of session.     Rehab Potential Good  PT Frequency 3x / week   PT Duration 8 weeks   PT Treatment/Interventions ADLs/Self  Care Home Management;Therapeutic activities;Therapeutic exercise;Balance training;Patient/family education;Prosthetic Training   PT Next Visit Plan Continued focus on equal weight bearing on both LE's, reaching outside BOS, and  B hip strengthening.    Consulted and Agree with Plan of Care Patient      Patient will benefit from skilled therapeutic intervention in order to improve the following deficits and impairments:  Abnormal gait, Decreased activity tolerance, Decreased balance, Decreased knowledge of use of DME, Decreased mobility, Decreased strength, Difficulty walking, Prosthetic Dependency  Visit Diagnosis: Other abnormalities of gait and mobility  Muscle weakness (generalized)    Problem List Patient Active Problem List   Diagnosis Date Noted  . Weight gain 11/30/2015  . Coronary artery disease involving coronary bypass graft of native heart without angina pectoris   . Tachycardia   . Leukocytosis   . Abnormality of gait   . Status post above knee amputation of left lower extremity ([place])   . Acute blood loss anemia   . Acute pulmonary edema (HCC)   . Acute congestive heart failure ([place])   . Acute respiratory failure with hypoxia ([place])   . Pressure ulcer 11/09/2015  . Necrotic toes ([place]) 11/08/2015  . Lower extremity cellulitis 11/08/2015  . Bradycardia 05/28/2015  . PVD (peripheral vascular disease) with claudication ([place]) 09/30/2014  . Encounter for therapeutic drug monitoring 11/26/2013  . Peripheral vascular disease ([place]) 08/06/2012  . Chronic anticoagulation 01/20/2011  . Diabetes mellitus ([place]) 03/04/2010  . Dyslipidemia 03/04/2010  . Essential hypertension 03/04/2010  . Hx of CABG 03/04/2010  . Permanent atrial fibrillation ([place]) 04/08/2009    Teena Irani, PTA/CLT [phone]  2016/03/19, 6:07 PM       G-Codes - 03/19/2016 Jan 02, 1805    Functional Assessment Tool Used clinical judgement    Functional Limitation Mobility: Walking and moving around    Mobility: Walking and Moving Around Current Status [phone]) At least 20 percent but less than 40 percent impaired, limited or restricted   Mobility: Walking and Moving Around Goal Status [phone]) At least 1 percent but less than 20 percent impaired, limited or restricted    [id number] At least 20 but less than 40 percent impaired  Deniece Ree PT, DPT [facility] [place], [name], [address] Phone: [phone]   Fax:  [phone]  Name: [name] MRN: [id number] Date of Birth: [birth year] PHYSICAL THERAPY DISCHARGE SUMMARY  Visits from Start of Care: 10  Current functional level related to goals / functional outcomes: As above   Remaining deficits: As above   Education / Equipment: HEP  Plan: Patient agrees to discharge.  Patient goals were partially met. Patient is being discharged due to being pleased with the current functional level.  ?????      Rayetta Humphrey, [place] CLT [phone]

## 2016-02-23 ENCOUNTER — Telehealth (HOSPITAL_COMMUNITY): Payer: Self-pay

## 2016-02-23 ENCOUNTER — Encounter (HOSPITAL_COMMUNITY): Payer: Medicare Other | Admitting: Physical Therapy

## 2016-02-23 NOTE — Telephone Encounter (Signed)
02/23/16 he called to say that the last time he was here he got home and there was bleeding on the bottom of his foot.  He went to the dr and was told to stop until that healed.  I asked him about putting therapy on hold and he said to just cancel the rest of his appointments.

## 2016-02-25 ENCOUNTER — Encounter (HOSPITAL_COMMUNITY): Payer: Medicare Other | Admitting: Physical Therapy

## 2016-02-28 ENCOUNTER — Encounter (HOSPITAL_COMMUNITY): Payer: Medicare Other | Admitting: Physical Therapy

## 2016-03-01 ENCOUNTER — Encounter (HOSPITAL_COMMUNITY): Payer: Medicare Other | Admitting: Physical Therapy

## 2016-03-03 ENCOUNTER — Encounter (HOSPITAL_COMMUNITY): Payer: Medicare Other | Admitting: Physical Therapy

## 2016-03-06 ENCOUNTER — Encounter (HOSPITAL_COMMUNITY): Payer: Medicare Other | Admitting: Physical Therapy

## 2016-03-08 ENCOUNTER — Encounter (HOSPITAL_COMMUNITY): Payer: Medicare Other | Admitting: Physical Therapy

## 2016-03-08 ENCOUNTER — Ambulatory Visit (INDEPENDENT_AMBULATORY_CARE_PROVIDER_SITE_OTHER): Payer: Medicare Other | Admitting: *Deleted

## 2016-03-08 DIAGNOSIS — Z5181 Encounter for therapeutic drug level monitoring: Secondary | ICD-10-CM

## 2016-03-08 DIAGNOSIS — I482 Chronic atrial fibrillation: Secondary | ICD-10-CM | POA: Diagnosis not present

## 2016-03-08 DIAGNOSIS — I4821 Permanent atrial fibrillation: Secondary | ICD-10-CM

## 2016-03-08 LAB — POCT INR: INR: 3.6

## 2016-03-10 ENCOUNTER — Encounter (HOSPITAL_COMMUNITY): Payer: Medicare Other | Admitting: Physical Therapy

## 2016-03-13 ENCOUNTER — Encounter (HOSPITAL_COMMUNITY): Payer: Medicare Other | Admitting: Physical Therapy

## 2016-03-15 ENCOUNTER — Encounter (HOSPITAL_COMMUNITY): Payer: Medicare Other | Admitting: Physical Therapy

## 2016-03-17 ENCOUNTER — Encounter (HOSPITAL_COMMUNITY): Payer: Medicare Other | Admitting: Physical Therapy

## 2016-03-22 ENCOUNTER — Encounter (HOSPITAL_COMMUNITY): Payer: Medicare Other | Admitting: Physical Therapy

## 2016-03-24 ENCOUNTER — Encounter (HOSPITAL_COMMUNITY): Payer: Medicare Other | Admitting: Physical Therapy

## 2016-03-29 ENCOUNTER — Ambulatory Visit (INDEPENDENT_AMBULATORY_CARE_PROVIDER_SITE_OTHER): Payer: Medicare Other | Admitting: *Deleted

## 2016-03-29 DIAGNOSIS — I482 Chronic atrial fibrillation: Secondary | ICD-10-CM

## 2016-03-29 DIAGNOSIS — Z5181 Encounter for therapeutic drug level monitoring: Secondary | ICD-10-CM

## 2016-03-29 DIAGNOSIS — I4821 Permanent atrial fibrillation: Secondary | ICD-10-CM

## 2016-03-29 LAB — POCT INR: INR: 2.2

## 2016-04-04 ENCOUNTER — Other Ambulatory Visit: Payer: Self-pay

## 2016-04-04 MED ORDER — AMLODIPINE BESYLATE 10 MG PO TABS: 10.0000 mg | ORAL_TABLET | Freq: Every day | ORAL | Status: DC

## 2016-04-26 ENCOUNTER — Ambulatory Visit (INDEPENDENT_AMBULATORY_CARE_PROVIDER_SITE_OTHER): Payer: Medicare Other | Admitting: *Deleted

## 2016-04-26 DIAGNOSIS — I482 Chronic atrial fibrillation: Secondary | ICD-10-CM

## 2016-04-26 DIAGNOSIS — Z5181 Encounter for therapeutic drug level monitoring: Secondary | ICD-10-CM

## 2016-04-26 DIAGNOSIS — I4821 Permanent atrial fibrillation: Secondary | ICD-10-CM

## 2016-04-26 LAB — POCT INR: INR: 1.9

## 2016-05-17 ENCOUNTER — Ambulatory Visit (INDEPENDENT_AMBULATORY_CARE_PROVIDER_SITE_OTHER): Payer: Medicare Other | Admitting: *Deleted

## 2016-05-17 DIAGNOSIS — I4821 Permanent atrial fibrillation: Secondary | ICD-10-CM

## 2016-05-17 DIAGNOSIS — Z5181 Encounter for therapeutic drug level monitoring: Secondary | ICD-10-CM | POA: Diagnosis not present

## 2016-05-17 DIAGNOSIS — I482 Chronic atrial fibrillation: Secondary | ICD-10-CM

## 2016-05-17 LAB — POCT INR: INR: 2.8

## 2016-05-25 ENCOUNTER — Ambulatory Visit (INDEPENDENT_AMBULATORY_CARE_PROVIDER_SITE_OTHER): Payer: Medicare Other | Admitting: Otolaryngology

## 2016-05-25 DIAGNOSIS — H903 Sensorineural hearing loss, bilateral: Secondary | ICD-10-CM

## 2016-05-29 ENCOUNTER — Emergency Department (HOSPITAL_COMMUNITY): Payer: Medicare Other

## 2016-05-29 ENCOUNTER — Encounter (HOSPITAL_COMMUNITY): Payer: Self-pay | Admitting: Emergency Medicine

## 2016-05-29 ENCOUNTER — Emergency Department (HOSPITAL_COMMUNITY)
Admission: EM | Admit: 2016-05-29 | Discharge: 2016-05-29 | Disposition: A | Discharge status: Home / Self Care | Payer: Medicare Other | Attending: Emergency Medicine | Admitting: Emergency Medicine

## 2016-05-29 DIAGNOSIS — Z951 Presence of aortocoronary bypass graft: Secondary | ICD-10-CM | POA: Diagnosis not present

## 2016-05-29 DIAGNOSIS — Z7901 Long term (current) use of anticoagulants: Secondary | ICD-10-CM | POA: Diagnosis not present

## 2016-05-29 DIAGNOSIS — E1140 Type 2 diabetes mellitus with diabetic neuropathy, unspecified: Secondary | ICD-10-CM | POA: Diagnosis not present

## 2016-05-29 DIAGNOSIS — Y999 Unspecified external cause status: Secondary | ICD-10-CM | POA: Insufficient documentation

## 2016-05-29 DIAGNOSIS — X501XXA Overexertion from prolonged static or awkward postures, initial encounter: Secondary | ICD-10-CM | POA: Insufficient documentation

## 2016-05-29 DIAGNOSIS — I10 Essential (primary) hypertension: Secondary | ICD-10-CM | POA: Diagnosis not present

## 2016-05-29 DIAGNOSIS — M25551 Pain in right hip: Secondary | ICD-10-CM | POA: Insufficient documentation

## 2016-05-29 DIAGNOSIS — Y939 Activity, unspecified: Secondary | ICD-10-CM | POA: Insufficient documentation

## 2016-05-29 DIAGNOSIS — Z79899 Other long term (current) drug therapy: Secondary | ICD-10-CM | POA: Diagnosis not present

## 2016-05-29 DIAGNOSIS — Z7982 Long term (current) use of aspirin: Secondary | ICD-10-CM | POA: Diagnosis not present

## 2016-05-29 DIAGNOSIS — Y929 Unspecified place or not applicable: Secondary | ICD-10-CM | POA: Diagnosis not present

## 2016-05-29 DIAGNOSIS — I2510 Atherosclerotic heart disease of native coronary artery without angina pectoris: Secondary | ICD-10-CM | POA: Diagnosis not present

## 2016-05-29 DIAGNOSIS — Z85828 Personal history of other malignant neoplasm of skin: Secondary | ICD-10-CM | POA: Insufficient documentation

## 2016-05-29 LAB — CBG MONITORING, ED: GLUCOSE-CAPILLARY: 86 mg/dL (ref 65–99)

## 2016-05-29 NOTE — ED Provider Notes (Signed)
[facility] DEPT Provider Note   CSN: [id number] Arrival date & time: 05/29/16  1216  First Provider Contact:  First MD Initiated Contact with Patient 05/29/16 1451        History   Chief Complaint Chief Complaint  Patient presents with  . Hip Pain    HPI [name] is a 80 y.o. male.  Pt had a left aka in Jan of this year and uses a scooter to help himself get around.  Pt said that he was transferring from his scooter to his truck with his right leg like he always does and he felt a pop and a terrible pain going down the outside of his right hip.  The pt denies any pain in his groin.  He was initially unable to put weight on the right leg, but now he can.  It still hurts, but not as bad.  He does not want any pain meds.   The history is provided by the patient.  Hip Pain  This is a new problem. The current episode started yesterday. The problem has been gradually improving.    Past Medical History:  Diagnosis Date  . Arteriosclerotic cardiovascular disease (ASCVD)    CABG in 1990s; 2002 total obstruction of LAD, CX and RCA with patent grafts and nl EF; Stress nuc. 2008 - mild LV dilation; normal EF; questionable small anteroapical scar; no ischemia  . Arthritis    "left knee" (11/08/2015)  . Atrial fibrillation ([place])   . Cellulitis 11/08/2015   "both feet"  . Chronic anticoagulation   . Dental crowns present   . Diabetic peripheral neuropathy (HCC)    bilateral lower leg, hands  . History of blood transfusion    "related to my leg surgery?"  . Hypertension    states under control with meds., has been on med. x "long time"  . Iron deficiency anemia   . Jaw cancer ([place]) 1990s   "squamous cell"  . Myocardial infarction ([place]) 1990s   "after my jaw OR"  . Pedal edema    "not a lot", per pt.  . Peripheral vascular disease ([place])   . Presence of retained hardware 07/2015   failed hardware jaw  . Runny nose 07/27/2015   clear drainage, per pt.  . Type II diabetes  mellitus ([place])    "diet controlled" (11/08/2015)    Patient Active Problem List   Diagnosis Date Noted  . Weight gain 11/30/2015  . Coronary artery disease involving coronary bypass graft of native heart without angina pectoris   . Tachycardia   . Leukocytosis   . Abnormality of gait   . Status post above knee amputation of left lower extremity ([place])   . Acute blood loss anemia   . Acute pulmonary edema (HCC)   . Acute congestive heart failure ([place])   . Acute respiratory failure with hypoxia ([place])   . Pressure ulcer 11/09/2015  . Necrotic toes ([place]) 11/08/2015  . Lower extremity cellulitis 11/08/2015  . Bradycardia 05/28/2015  . PVD (peripheral vascular disease) with claudication ([place]) 09/30/2014  . Encounter for therapeutic drug monitoring 11/26/2013  . Peripheral vascular disease ([place]) 08/06/2012  . Chronic anticoagulation 01/20/2011  . Diabetes mellitus ([place]) 03/04/2010  . Dyslipidemia 03/04/2010  . Essential hypertension 03/04/2010  . Hx of CABG 03/04/2010  . Permanent atrial fibrillation ([place]) 04/08/2009    Past Surgical History:  Procedure Laterality Date  . AMPUTATION Left 11/11/2015   Procedure: LEFT ABOVE KNEE AMPUTATION;  Surgeon: Angelia Mould,  MD;  Location: [facility];  Service: Vascular;  Laterality: Left;  . CARDIAC CATHETERIZATION  1990s; 04/16/2001  . CATARACT EXTRACTION W/ INTRAOCULAR LENS  IMPLANT, BILATERAL Bilateral 09/2015  . COLONOSCOPY N/A 11/27/2014   Procedure: COLONOSCOPY;  Surgeon: Rogene Houston, MD;  [facility];  Service: Endoscopy;  Laterality: N/A;  830  . CORONARY ARTERY BYPASS GRAFT  1990's   "CABG X4"  . FRACTURE SURGERY    . I&D EXTREMITY Left 11/10/2015   Procedure: INCISION AND DRAINAGE LEFT FOOT, AMPUTATION OF LEFT THIRD TOE;  Surgeon: Conrad Westfield, MD;  Location: [facility];  Service: Vascular;  Laterality: Left;  . INCISION AND DRAINAGE ABSCESS Left 06/16/2005   wide exc. abscess 5th toe  . MANDIBULAR HARDWARE REMOVAL Left  08/02/2015   Procedure:  HARDWARE REMOVAL TWO MANDIBULAR SCREWS;  Surgeon: Jannette Fogo, DDS;  Location: [facility];  Service: Oral Surgery;  Laterality: Left;  . ORIF TIBIA FRACTURE Left ~ 1970  . SQUAMOUS CELL CARCINOMA EXCISION Left 1993   "took my jaw out; got cadavar in there now"  . TONSILLECTOMY         Home Medications    Prior to Admission medications   Medication Sig Start Date End Date Taking? Authorizing Provider  amLODipine (NORVASC) 10 MG tablet Take 1 tablet (10 mg total) by mouth daily. 04/04/16  Yes Herminio Commons, MD  aspirin 81 MG tablet Take 81 mg by mouth 2 (two) times a week.    Yes Historical Provider, MD  Difluprednate (DUREZOL) 0.05 % EMUL Apply 1 drop to eye 2 (two) times daily.   Yes Historical Provider, MD  feeding supplement (BOOST / RESOURCE BREEZE) LIQD Take 1 Container by mouth 3 (three) times daily between meals. 11/16/15  Yes Loleta Chance, MD  ferrous sulfate 325 (65 FE) MG tablet Take 65 mg by mouth daily with breakfast.   Yes Historical Provider, MD  fish oil-omega-3 fatty acids 1000 MG capsule Take 1 capsule by mouth daily.     Yes Historical Provider, MD  losartan-hydrochlorothiazide (HYZAAR) 100-25 MG tablet Take 1 tablet by mouth daily. 01/21/16  Yes Lendon Colonel, NP  Multiple Vitamins-Minerals (CENTRUM SILVER PO) Take 1 tablet by mouth daily.     Yes Historical Provider, MD  nebivolol (BYSTOLIC) 5 MG tablet Take 1 tablet (5 mg total) by mouth daily. 05/28/15  Yes Herminio Commons, MD  Probiotic Product ([place] PO) Take 1 capsule by mouth daily.    Yes Historical Provider, MD  silver sulfADIAZINE (SILVADENE) 1 % cream Apply topically daily. Patient taking differently: Apply 1 application topically daily.  11/16/15  Yes Loleta Chance, MD  warfarin (COUMADIN) 2 MG tablet Take 1 tablet (2 mg total) by mouth daily. Patient taking differently: Take 2 mg by mouth daily. Take one-half tablet (66m total) on Sundays,  Mondays, Wednesdays, and Fridays. Take one tablet on all other days (266mtotal) 01/21/16  Yes KaLendon ColonelNP    Family History Family History  Problem Relation Age of Onset  . Heart disease Mother     Social History Social History  Substance Use Topics  . Smoking status: Never Smoker  . Smokeless tobacco: Never Used  . Alcohol use No     Allergies   Lipitor [atorvastatin]; Simvastatin; Penicillins; and Sulfa antibiotics   Review of Systems Review of Systems  Musculoskeletal:       Right hip pain  All other systems reviewed and are negative.  Physical Exam Updated Vital Signs BP 139/80 (BP Location: Left Arm)   Pulse 60   Temp 97.9 F (36.6 C) (Oral)   Resp 16   Ht 5' 8.5" (1.74 m)   Wt 183 lb (83 kg)   SpO2 100%   BMI 27.42 kg/m   Physical Exam  Constitutional: He is oriented to person, place, and time. He appears well-developed and well-nourished.  HENT:  Head: Normocephalic and atraumatic.  Right Ear: External ear normal.  Left Ear: External ear normal.  Nose: Nose normal.  Mouth/Throat: Oropharynx is clear and moist.  Eyes: Conjunctivae and EOM are normal. Pupils are equal, round, and reactive to light.  Neck: Normal range of motion. Neck supple.  Cardiovascular: Normal rate, regular rhythm, normal heart sounds and intact distal pulses.   Pulmonary/Chest: Effort normal and breath sounds normal.  Abdominal: Soft. Bowel sounds are normal.  Musculoskeletal: Normal range of motion.       Legs: Neurological: He is alert and oriented to person, place, and time.  Skin: Skin is warm and dry.  Psychiatric: He has a normal mood and affect. His behavior is normal. Judgment and thought content normal.  Nursing note and vitals reviewed.    ED Treatments / Results  Labs (all labs ordered are listed, but only abnormal results are displayed) Labs Reviewed  CBG MONITORING, ED    EKG  EKG Interpretation None       Radiology Ct Hip Right Wo  Contrast  Result Date: 05/29/2016 CLINICAL DATA:  Pain in the right hip due to a twisting injury yesterday. EXAM: CT OF THE RIGHT HIP WITHOUT CONTRAST TECHNIQUE: Multidetector CT imaging of the right hip was performed according to the standard protocol. Multiplanar CT image reconstructions were also generated. COMPARISON:  Radiographs dated 05/29/2016 FINDINGS: Bones/Joint/Cartilage There is no fracture or dislocation. Slight osteoarthritis of hip joint. No joint effusions. Slight joint space narrowing. Ligaments Suboptimally assessed by CT. Muscles and Tendons No visible abnormality. Soft tissues No evidence of hematoma. Chronic calcifications in the right gluteus medius muscle which are probably vascular. Slight soft tissue edema lateral to the right hip. IMPRESSION: No acute abnormality of the right hip.  Slight osteoarthritis. Electronically Signed   By: Lorriane Shire M.D.   On: 05/29/2016 17:49   Dg Hip Unilat W Or Wo Pelvis 2-3 Views Right  Result Date: 05/29/2016 CLINICAL DATA:  The patient felt a pop in the right hip while getting into a truck today. Initial encounter. EXAM: DG HIP (WITH OR WITHOUT PELVIS) 2-3V RIGHT COMPARISON:  None. FINDINGS: No acute bony or joint abnormality is identified. Mild degenerative change is present within it. Bones are osteopenic. IMPRESSION: No acute abnormality. Electronically Signed   By: Inge Rise M.D.   On: 05/29/2016 13:26    Procedures Procedures (including critical care time)  Medications Ordered in ED Medications - No data to display   Initial Impression / Assessment and Plan / ED Course  I have reviewed the triage vital signs and the nursing notes.  Pertinent labs & imaging results that were available during my care of the patient were reviewed by me and considered in my medical decision making (see chart for details).  Clinical Course  Value Comment By Time  DG Hip Unilat W or Wo Pelvis 2-3 Views Right (Reviewed) Isla Pence, MD 08/07  1451    Soft tissue edema on ct corresponds to where pt's pain is located.  I think he strained a muscle in that area.  Pt  does not want pain meds for home.  He knows to return if worse.  Final Clinical Impressions(s) / ED Diagnoses   Final diagnoses:  Right hip pain    New Prescriptions New Prescriptions   No medications on file     Isla Pence, MD 05/29/16 [phone]

## 2016-05-29 NOTE — ED Triage Notes (Signed)
Pt reports turned yesterday and reports felt a "pop" in right hip/leg. Pt reports soreness to right leg at this time. Pt denies groin or gi/gu symptoms. Pt reports is on blood thinner.

## 2016-05-31 ENCOUNTER — Emergency Department (HOSPITAL_COMMUNITY)
Admission: EM | Admit: 2016-05-31 | Discharge: 2016-05-31 | Disposition: A | Discharge status: Home / Self Care | Payer: Medicare Other | Attending: Emergency Medicine | Admitting: Emergency Medicine

## 2016-05-31 ENCOUNTER — Emergency Department (HOSPITAL_COMMUNITY): Payer: Medicare Other

## 2016-05-31 ENCOUNTER — Encounter (HOSPITAL_COMMUNITY): Payer: Self-pay | Admitting: Neurology

## 2016-05-31 DIAGNOSIS — I10 Essential (primary) hypertension: Secondary | ICD-10-CM | POA: Insufficient documentation

## 2016-05-31 DIAGNOSIS — E1140 Type 2 diabetes mellitus with diabetic neuropathy, unspecified: Secondary | ICD-10-CM | POA: Diagnosis not present

## 2016-05-31 DIAGNOSIS — Z79899 Other long term (current) drug therapy: Secondary | ICD-10-CM | POA: Diagnosis not present

## 2016-05-31 DIAGNOSIS — Z7901 Long term (current) use of anticoagulants: Secondary | ICD-10-CM | POA: Insufficient documentation

## 2016-05-31 DIAGNOSIS — I2510 Atherosclerotic heart disease of native coronary artery without angina pectoris: Secondary | ICD-10-CM | POA: Diagnosis not present

## 2016-05-31 DIAGNOSIS — Y999 Unspecified external cause status: Secondary | ICD-10-CM | POA: Insufficient documentation

## 2016-05-31 DIAGNOSIS — Z8500 Personal history of malignant neoplasm of unspecified digestive organ: Secondary | ICD-10-CM | POA: Insufficient documentation

## 2016-05-31 DIAGNOSIS — Y939 Activity, unspecified: Secondary | ICD-10-CM | POA: Insufficient documentation

## 2016-05-31 DIAGNOSIS — Y929 Unspecified place or not applicable: Secondary | ICD-10-CM | POA: Insufficient documentation

## 2016-05-31 DIAGNOSIS — S92501A Displaced unspecified fracture of right lesser toe(s), initial encounter for closed fracture: Secondary | ICD-10-CM

## 2016-05-31 DIAGNOSIS — Z7982 Long term (current) use of aspirin: Secondary | ICD-10-CM | POA: Insufficient documentation

## 2016-05-31 DIAGNOSIS — Z951 Presence of aortocoronary bypass graft: Secondary | ICD-10-CM | POA: Diagnosis not present

## 2016-05-31 DIAGNOSIS — I252 Old myocardial infarction: Secondary | ICD-10-CM | POA: Diagnosis not present

## 2016-05-31 DIAGNOSIS — X58XXXA Exposure to other specified factors, initial encounter: Secondary | ICD-10-CM | POA: Diagnosis not present

## 2016-05-31 DIAGNOSIS — S92515A Nondisplaced fracture of proximal phalanx of left lesser toe(s), initial encounter for closed fracture: Secondary | ICD-10-CM | POA: Insufficient documentation

## 2016-05-31 DIAGNOSIS — S99922A Unspecified injury of left foot, initial encounter: Secondary | ICD-10-CM | POA: Diagnosis present

## 2016-05-31 NOTE — ED Provider Notes (Signed)
[facility] DEPT Provider Note   CSN: [id number] Arrival date & time: 05/31/16  2119  First Provider Contact:  First MD Initiated Contact with Patient 05/31/16 [phone]        History   Chief Complaint Chief Complaint  Patient presents with  . Leg Injury    HPI [name] is a 80 y.o. male.  Patient presents the emergency department as he awoke this morning and noticed a small amount of bruising to his right second toe.  He has a history of left AKA after an ascending necrotizing infection and therefore he wants to be extremely cautious with his remaining right lower extremity.  He does not remember injury or trauma to his right foot but does report that he has severe neuropathy and cannot feel much in his right foot.  He reports no fevers or chills.  He reports no new redness.   The history is provided by the patient and medical records.    Past Medical History:  Diagnosis Date  . Arteriosclerotic cardiovascular disease (ASCVD)    CABG in 1990s; 2002 total obstruction of LAD, CX and RCA with patent grafts and nl EF; Stress nuc. 2008 - mild LV dilation; normal EF; questionable small anteroapical scar; no ischemia  . Arthritis    "left knee" (11/08/2015)  . Atrial fibrillation ([place])   . Cellulitis 11/08/2015   "both feet"  . Chronic anticoagulation   . Dental crowns present   . Diabetic peripheral neuropathy (HCC)    bilateral lower leg, hands  . History of blood transfusion    "related to my leg surgery?"  . Hypertension    states under control with meds., has been on med. x "long time"  . Iron deficiency anemia   . Jaw cancer ([place]) 1990s   "squamous cell"  . Myocardial infarction ([place]) 1990s   "after my jaw OR"  . Pedal edema    "not a lot", per pt.  . Peripheral vascular disease ([place])   . Presence of retained hardware 07/2015   failed hardware jaw  . Runny nose 07/27/2015   clear drainage, per pt.  . Type II diabetes mellitus ([place])    "diet controlled"  (11/08/2015)    Patient Active Problem List   Diagnosis Date Noted  . Weight gain 11/30/2015  . Coronary artery disease involving coronary bypass graft of native heart without angina pectoris   . Tachycardia   . Leukocytosis   . Abnormality of gait   . Status post above knee amputation of left lower extremity ([place])   . Acute blood loss anemia   . Acute pulmonary edema (HCC)   . Acute congestive heart failure ([place])   . Acute respiratory failure with hypoxia ([place])   . Pressure ulcer 11/09/2015  . Necrotic toes ([place]) 11/08/2015  . Lower extremity cellulitis 11/08/2015  . Bradycardia 05/28/2015  . PVD (peripheral vascular disease) with claudication ([place]) 09/30/2014  . Encounter for therapeutic drug monitoring 11/26/2013  . Peripheral vascular disease ([place]) 08/06/2012  . Chronic anticoagulation 01/20/2011  . Diabetes mellitus ([place]) 03/04/2010  . Dyslipidemia 03/04/2010  . Essential hypertension 03/04/2010  . Hx of CABG 03/04/2010  . Permanent atrial fibrillation ([place]) 04/08/2009    Past Surgical History:  Procedure Laterality Date  . AMPUTATION Left 11/11/2015   Procedure: LEFT ABOVE KNEE AMPUTATION;  Surgeon: Angelia Mould, MD;  Location: [facility];  Service: Vascular;  Laterality: Left;  . CARDIAC CATHETERIZATION  1990s; 04/16/2001  . CATARACT EXTRACTION W/ INTRAOCULAR LENS  IMPLANT, BILATERAL Bilateral 09/2015  . COLONOSCOPY N/A 11/27/2014   Procedure: COLONOSCOPY;  Surgeon: Rogene Houston, MD;  [facility];  Service: Endoscopy;  Laterality: N/A;  830  . CORONARY ARTERY BYPASS GRAFT  1990's   "CABG X4"  . FRACTURE SURGERY    . I&D EXTREMITY Left 11/10/2015   Procedure: INCISION AND DRAINAGE LEFT FOOT, AMPUTATION OF LEFT THIRD TOE;  Surgeon: Conrad [place], MD;  Location: [facility];  Service: Vascular;  Laterality: Left;  . INCISION AND DRAINAGE ABSCESS Left 06/16/2005   wide exc. abscess 5th toe  . MANDIBULAR HARDWARE REMOVAL Left 08/02/2015   Procedure:  HARDWARE  REMOVAL TWO MANDIBULAR SCREWS;  Surgeon: Jannette Fogo, DDS;  Location: [facility];  Service: Oral Surgery;  Laterality: Left;  . ORIF TIBIA FRACTURE Left ~ 1970  . SQUAMOUS CELL CARCINOMA EXCISION Left 1993   "took my jaw out; got cadavar in there now"  . TONSILLECTOMY         Home Medications    Prior to Admission medications   Medication Sig Start Date End Date Taking? Authorizing Provider  amLODipine (NORVASC) 10 MG tablet Take 1 tablet (10 mg total) by mouth daily. 04/04/16  Yes Herminio Commons, MD  aspirin 81 MG tablet Take 81 mg by mouth 2 (two) times a week.    Yes Historical Provider, MD  Difluprednate (DUREZOL) 0.05 % EMUL Apply 1 drop to eye 2 (two) times daily.   Yes Historical Provider, MD  feeding supplement (BOOST / RESOURCE BREEZE) LIQD Take 1 Container by mouth 3 (three) times daily between meals. 11/16/15  Yes Loleta Chance, MD  ferrous sulfate 325 (65 FE) MG tablet Take 65 mg by mouth daily with breakfast.   Yes Historical Provider, MD  fish oil-omega-3 fatty acids 1000 MG capsule Take 1 capsule by mouth daily.     Yes Historical Provider, MD  losartan-hydrochlorothiazide (HYZAAR) 100-25 MG tablet Take 1 tablet by mouth daily. 01/21/16  Yes Lendon Colonel, NP  Multiple Vitamins-Minerals (CENTRUM SILVER PO) Take 1 tablet by mouth daily.     Yes Historical Provider, MD  nebivolol (BYSTOLIC) 5 MG tablet Take 1 tablet (5 mg total) by mouth daily. 05/28/15  Yes Herminio Commons, MD  Probiotic Product ([place] PO) Take 1 capsule by mouth daily.    Yes Historical Provider, MD  silver sulfADIAZINE (SILVADENE) 1 % cream Apply topically daily. Patient taking differently: Apply 1 application topically daily.  11/16/15  Yes Loleta Chance, MD  warfarin (JANTOVEN) 2.5 MG tablet Take 1.25-2.5 mg by mouth See admin instructions. 1.25 mg ( 1/2 tablet  of 2.5 mg) on Sunday, Monday, Wednesday, Friday. All other days patient takes 2.5 mg   Yes Historical Provider,  MD  warfarin (COUMADIN) 2 MG tablet Take 1 tablet (2 mg total) by mouth daily. Patient taking differently: Take 2 mg by mouth daily. Take one-half tablet (64m total) on Sundays, Mondays, Wednesdays, and Fridays. Take one tablet on all other days (257mtotal) 01/21/16   KaLendon ColonelNP    Family History Family History  Problem Relation Age of Onset  . Heart disease Mother     Social History Social History  Substance Use Topics  . Smoking status: Never Smoker  . Smokeless tobacco: Never Used  . Alcohol use No     Allergies   Lipitor [atorvastatin]; Simvastatin; Penicillins; and Sulfa antibiotics   Review of Systems Review of Systems  All other systems reviewed  and are negative.    Physical Exam Updated Vital Signs BP 130/87   Pulse 66   Temp 98 F (36.7 C) (Oral)   Resp 19   SpO2 99%   Physical Exam  Constitutional: He is oriented to person, place, and time. He appears well-developed and well-nourished.  HENT:  Head: Normocephalic.  Eyes: EOM are normal.  Neck: Normal range of motion.  Pulmonary/Chest: Effort normal.  Abdominal: He exhibits no distension.  Musculoskeletal:  Left AKA.  Normal pulses right foot.  Small amount of bruising of the proximal and distal phalanx of the right second toe without obvious deformity.  No significant skin breakdown.  No erythema of the right lower extremity  Neurological: He is alert and oriented to person, place, and time.  Psychiatric: He has a normal mood and affect.  Nursing note and vitals reviewed.    ED Treatments / Results  Labs (all labs ordered are listed, but only abnormal results are displayed) Labs Reviewed - No data to display  EKG  EKG Interpretation None       Radiology   Dg Foot Complete Right  Result Date: 05/31/2016 CLINICAL DATA:  Bruising involving right second toe. EXAM: RIGHT FOOT COMPLETE - 3+ VIEW COMPARISON:  None. FINDINGS: There may be a subtle nondisplaced fracture involving the  distal aspect of the second proximal phalanx. Mild degenerative disease present throughout the toes. There is old bony resorption of the head of the fifth metatarsal likely related to prior injury. No findings to suggest osteomyelitis or focal bony lesions. Vascular calcifications are seen involving distal tibial and pedal arteries. Calcaneal spurs are present. IMPRESSION: Possible subtle nondisplaced fracture involving the distal aspect of the second proximal phalanx. Electronically Signed   By: Aletta Edouard M.D.   On: 05/31/2016 10:06    Procedures Procedures (including critical care time)  Medications Ordered in ED Medications - No data to display   Initial Impression / Assessment and Plan / ED Course  I have reviewed the triage vital signs and the nursing notes.  Pertinent labs & imaging results that were available during my care of the patient were reviewed by me and considered in my medical decision making (see chart for details).  Clinical Course    With the bruising and the x-ray demonstrating possible subtle nondisplaced fracture involving the distal aspect of the second proximal phalanx this is the likely cause of the bruising.  Recommend hard soled shoe  Final Clinical Impressions(s) / ED Diagnoses   Final diagnoses:  Closed fracture of second toe of right foot, initial encounter    New Prescriptions New Prescriptions   No medications on file     Jola Schmidt, MD 05/31/16 [phone]

## 2016-05-31 NOTE — ED Triage Notes (Signed)
Pt here c/o bruising to right 2nd toe that he noticed this morning. Denies injury, but has hx of neuropathy and had Left AKA this year. Pt denies any pain is a x 4.

## 2016-06-01 ENCOUNTER — Encounter: Payer: Self-pay | Admitting: Vascular Surgery

## 2016-06-07 ENCOUNTER — Ambulatory Visit (INDEPENDENT_AMBULATORY_CARE_PROVIDER_SITE_OTHER): Payer: Medicare Other | Admitting: Vascular Surgery

## 2016-06-07 ENCOUNTER — Encounter: Payer: Self-pay | Admitting: Vascular Surgery

## 2016-06-07 ENCOUNTER — Ambulatory Visit (HOSPITAL_COMMUNITY)
Admission: RE | Admit: 2016-06-07 | Discharge: 2016-06-07 | Disposition: A | Discharge status: Home / Self Care | Payer: Medicare Other | Source: Ambulatory Visit | Attending: Vascular Surgery | Admitting: Vascular Surgery

## 2016-06-07 VITALS — BP 154/75 | HR 57 | Ht 68.5 in | Wt 185.0 lb

## 2016-06-07 DIAGNOSIS — E1151 Type 2 diabetes mellitus with diabetic peripheral angiopathy without gangrene: Secondary | ICD-10-CM | POA: Diagnosis not present

## 2016-06-07 DIAGNOSIS — I739 Peripheral vascular disease, unspecified: Secondary | ICD-10-CM

## 2016-06-07 DIAGNOSIS — I10 Essential (primary) hypertension: Secondary | ICD-10-CM | POA: Insufficient documentation

## 2016-06-07 DIAGNOSIS — I70213 Atherosclerosis of native arteries of extremities with intermittent claudication, bilateral legs: Secondary | ICD-10-CM | POA: Insufficient documentation

## 2016-06-07 DIAGNOSIS — E1142 Type 2 diabetes mellitus with diabetic polyneuropathy: Secondary | ICD-10-CM | POA: Diagnosis not present

## 2016-06-07 DIAGNOSIS — I2510 Atherosclerotic heart disease of native coronary artery without angina pectoris: Secondary | ICD-10-CM | POA: Diagnosis not present

## 2016-06-07 DIAGNOSIS — R938 Abnormal findings on diagnostic imaging of other specified body structures: Secondary | ICD-10-CM | POA: Insufficient documentation

## 2016-06-07 DIAGNOSIS — R0989 Other specified symptoms and signs involving the circulatory and respiratory systems: Secondary | ICD-10-CM | POA: Diagnosis present

## 2016-06-07 NOTE — Progress Notes (Signed)
Patient name: [name] MRN: [id number] DOB: September 13, 1936 Sex: male  REASON FOR VISIT: Follow up of peripheral vascular disease.  HPI: [name] is a 80 y.o. male who had originally presented with a rapidly progressive infection of his left lower extremity and required a left above-the-knee amputation. I last saw him in February of this year and this was healing well and he was set up to get a prosthesis. I set him up for 6 month follow up visit so that we can follow his right leg closely.  Since I saw him last, he has obtained a prosthesis however he prefers to be in a wheelchair. He denies any symptoms in his right leg. He did fall however and break his right second toe which is healing. He denies rest pain in the right lower extremity. He denies any history of nonhealing ulcers. He is not a smoker.  Past Medical History:  Diagnosis Date  . Arteriosclerotic cardiovascular disease (ASCVD)    CABG in 1990s; 2002 total obstruction of LAD, CX and RCA with patent grafts and nl EF; Stress nuc. 2008 - mild LV dilation; normal EF; questionable small anteroapical scar; no ischemia  . Arthritis    "left knee" (11/08/2015)  . Atrial fibrillation ([place])   . Cellulitis 11/08/2015   "both feet"  . Chronic anticoagulation   . Dental crowns present   . Diabetic peripheral neuropathy (HCC)    bilateral lower leg, hands  . History of blood transfusion    "related to my leg surgery?"  . Hypertension    states under control with meds., has been on med. x "long time"  . Iron deficiency anemia   . Jaw cancer ([place]) 1990s   "squamous cell"  . Myocardial infarction ([place]) 1990s   "after my jaw OR"  . Pedal edema    "not a lot", per pt.  . Peripheral vascular disease ([place])   . Presence of retained hardware 07/2015   failed hardware jaw  . Runny nose 07/27/2015   clear drainage, per pt.  . Type II diabetes mellitus ([place])    "diet controlled" (11/08/2015)    Family History  Problem Relation Age  of Onset  . Heart disease Mother     SOCIAL HISTORY: Social History  Substance Use Topics  . Smoking status: Never Smoker  . Smokeless tobacco: Never Used  . Alcohol use No    Allergies  Allergen Reactions  . Lipitor [Atorvastatin] Other (See Comments)    SEVERE HEADACHE  . Simvastatin Other (See Comments)    MUSCLE ACHES  . Penicillins Rash    Has patient had a PCN reaction causing immediate rash, facial/tongue/throat swelling, SOB or lightheadedness with hypotension: Yes Has patient had a PCN reaction causing severe rash involving mucus membranes or skin necrosis: No Has patient had a PCN reaction that required hospitalization No Has patient had a PCN reaction occurring within the last 10 years: No If all of the above answers are "NO", then may proceed with Cephalosporin use.   . Sulfa Antibiotics Rash    Tolerates silver sulfadiazine cream at home    Current Outpatient Prescriptions  Medication Sig Dispense Refill  . amLODipine (NORVASC) 10 MG tablet Take 1 tablet (10 mg total) by mouth daily. 90 tablet 3  . aspirin 81 MG tablet Take 81 mg by mouth 2 (two) times a week.     . Difluprednate (DUREZOL) 0.05 % EMUL Apply 1 drop to eye 2 (two) times daily.    Marland Kitchen  feeding supplement (BOOST / RESOURCE BREEZE) LIQD Take 1 Container by mouth 3 (three) times daily between meals. 90 Container 4  . ferrous sulfate 325 (65 FE) MG tablet Take 65 mg by mouth daily with breakfast.    . fish oil-omega-3 fatty acids 1000 MG capsule Take 1 capsule by mouth daily.      Marland Kitchen losartan-hydrochlorothiazide (HYZAAR) 100-25 MG tablet Take 1 tablet by mouth daily. 90 tablet 3  . Multiple Vitamins-Minerals (CENTRUM SILVER PO) Take 1 tablet by mouth daily.      . nebivolol (BYSTOLIC) 5 MG tablet Take 1 tablet (5 mg total) by mouth daily. 90 tablet 3  . Probiotic Product (PHILLIPS COLON HEALTH PO) Take 1 capsule by mouth daily.     . silver sulfADIAZINE (SILVADENE) 1 % cream Apply topically daily. (Patient  taking differently: Apply 1 application topically daily. ) 50 g 0  . warfarin (JANTOVEN) 2.5 MG tablet Take 1.25-2.5 mg by mouth See admin instructions. 1.25 mg ( 1/2 tablet  of 2.5 mg) on Sunday, Monday, Wednesday, Friday. All other days patient takes 2.5 mg    . warfarin (COUMADIN) 2 MG tablet Take 1 tablet (2 mg total) by mouth daily. (Patient not taking: Reported on 06/07/2016) 30 tablet 3   No current facility-administered medications for this visit.     REVIEW OF SYSTEMS:  [X]  denotes positive finding, [ ]  denotes negative finding Cardiac  Comments:  Chest pain or chest pressure:    Shortness of breath upon exertion:    Short of breath when lying flat:    Irregular heart rhythm: X       Vascular    Pain in calf, thigh, or hip brought on by ambulation:    Pain in feet at night that wakes you up from your sleep:     Blood clot in your veins:    Leg swelling:         Pulmonary    Oxygen at home:    Productive cough:     Wheezing:         Neurologic    Sudden weakness in arms or legs:     Sudden numbness in arms or legs:     Sudden onset of difficulty speaking or slurred speech:    Temporary loss of vision in one eye:     Problems with dizziness:         Gastrointestinal    Blood in stool:     Vomited blood:         Genitourinary    Burning when urinating:     Blood in urine:        Psychiatric    Major depression:         Hematologic    Bleeding problems:    Problems with blood clotting too easily:        Skin    Rashes or ulcers:        Constitutional    Fever or chills:      PHYSICAL EXAM: Vitals:   06/07/16 1547 06/07/16 1550  BP: (!) 156/70 (!) 154/75  Pulse: (!) 57   SpO2: 98%   Weight: 185 lb (83.9 kg)   Height: 5' 8.5" (1.74 m)     GENERAL: The patient is a well-nourished male, in no acute distress. The vital signs are documented above. CARDIAC: There is a regular rate and rhythm.  VASCULAR: I do not detect carotid bruits. He has a palpable  right femoral pulse. I  cannot palpate pedal pulses. Right foot appears warm and well perfused. PULMONARY: There is good air exchange bilaterally without wheezing or rales. ABDOMEN: Soft and non-tender with normal pitched bowel sounds.  MUSCULOSKELETAL: His left AKA is healed. NEUROLOGIC: No focal weakness or paresthesias are detected. SKIN: There are no ulcers or rashes noted. PSYCHIATRIC: The patient has a normal affect.  DATA:   LOWER EXTREMITY ARTERIAL DOPPLER: I have inability interpreted his lower extremity arterial Doppler study of the right lower extremity. He has a biphasic posterior tibial signal with the Doppler and a monophasic dorsalis pedis signal. His arteries are not compressible and an ABI could not be obtained however the toe pressure on the right is 99 mmHg.  MEDICAL ISSUES:  PERIPHERAL VASCULAR DISEASE: the patient has no symptoms in the right lower extremity. His Doppler suggests good perfusion on the right although his arteries are calcified. Given the rapidly progressive infection of the left leg, I would recommend continued follow up of the right leg and I will see him back in 1 year with ABIs. He knows to call sooner if he has problems. Of note, I have encouraged him to use his prosthesis and try to ambulate as much as possible.  Deitra Mayo [facility] [phone]

## 2016-06-08 NOTE — Addendum Note (Signed)
Addended by: Reola Calkins on: 06/08/2016 03:51 PM   Modules accepted: Orders

## 2016-06-14 ENCOUNTER — Ambulatory Visit (INDEPENDENT_AMBULATORY_CARE_PROVIDER_SITE_OTHER): Payer: Medicare Other | Admitting: *Deleted

## 2016-06-14 DIAGNOSIS — Z5181 Encounter for therapeutic drug level monitoring: Secondary | ICD-10-CM

## 2016-06-14 DIAGNOSIS — I482 Chronic atrial fibrillation: Secondary | ICD-10-CM

## 2016-06-14 DIAGNOSIS — I4821 Permanent atrial fibrillation: Secondary | ICD-10-CM

## 2016-06-14 LAB — POCT INR: INR: 2.3

## 2016-06-19 ENCOUNTER — Other Ambulatory Visit: Payer: Self-pay

## 2016-06-19 MED ORDER — NEBIVOLOL HCL 5 MG PO TABS: 5.0000 mg | ORAL_TABLET | Freq: Every day | ORAL | 3 refills | Status: DC

## 2016-06-19 NOTE — Telephone Encounter (Signed)
Refilled bystolic 

## 2016-07-12 ENCOUNTER — Ambulatory Visit (INDEPENDENT_AMBULATORY_CARE_PROVIDER_SITE_OTHER): Payer: Medicare Other | Admitting: *Deleted

## 2016-07-12 DIAGNOSIS — Z5181 Encounter for therapeutic drug level monitoring: Secondary | ICD-10-CM

## 2016-07-12 DIAGNOSIS — I482 Chronic atrial fibrillation: Secondary | ICD-10-CM

## 2016-07-12 DIAGNOSIS — I4821 Permanent atrial fibrillation: Secondary | ICD-10-CM

## 2016-07-12 LAB — POCT INR: INR: 2.3

## 2016-07-20 ENCOUNTER — Other Ambulatory Visit: Payer: Self-pay

## 2016-07-20 MED ORDER — WARFARIN SODIUM 2.5 MG PO TABS: ORAL_TABLET | ORAL | 1 refills | Status: DC

## 2016-07-24 ENCOUNTER — Encounter: Payer: Self-pay | Admitting: Cardiovascular Disease

## 2016-07-24 ENCOUNTER — Ambulatory Visit (INDEPENDENT_AMBULATORY_CARE_PROVIDER_SITE_OTHER): Payer: Medicare Other | Admitting: Cardiovascular Disease

## 2016-07-24 VITALS — BP 116/66 | HR 90 | Ht 68.0 in | Wt 187.0 lb

## 2016-07-24 DIAGNOSIS — I209 Angina pectoris, unspecified: Secondary | ICD-10-CM | POA: Diagnosis not present

## 2016-07-24 DIAGNOSIS — I482 Chronic atrial fibrillation: Secondary | ICD-10-CM | POA: Diagnosis not present

## 2016-07-24 DIAGNOSIS — E7800 Pure hypercholesterolemia, unspecified: Secondary | ICD-10-CM

## 2016-07-24 DIAGNOSIS — I4821 Permanent atrial fibrillation: Secondary | ICD-10-CM

## 2016-07-24 DIAGNOSIS — I739 Peripheral vascular disease, unspecified: Secondary | ICD-10-CM | POA: Diagnosis not present

## 2016-07-24 DIAGNOSIS — I2510 Atherosclerotic heart disease of native coronary artery without angina pectoris: Secondary | ICD-10-CM | POA: Diagnosis not present

## 2016-07-24 DIAGNOSIS — I25708 Atherosclerosis of coronary artery bypass graft(s), unspecified, with other forms of angina pectoris: Secondary | ICD-10-CM

## 2016-07-24 DIAGNOSIS — I10 Essential (primary) hypertension: Secondary | ICD-10-CM

## 2016-07-24 DIAGNOSIS — Z5181 Encounter for therapeutic drug level monitoring: Secondary | ICD-10-CM

## 2016-07-24 MED ORDER — METOPROLOL TARTRATE 25 MG PO TABS: 25.0000 mg | ORAL_TABLET | Freq: Two times a day (BID) | ORAL | 3 refills | Status: DC

## 2016-07-24 NOTE — Progress Notes (Signed)
SUBJECTIVE: The patient presents for follow-up of atrial fibrillation and coronary artery disease. He has a history of CABG, diabetes, hypertension, and hyperlipidemia.  He denies chest pain, shortness of breath, abdominal pain, leg swelling, dizziness, and syncope.  Underwent left AKA earlier this year.  Primary complaint relates to runny nose after starting Bystolic.   Review of Systems: As per "subjective", otherwise negative.  Allergies  Allergen Reactions  . Lipitor [Atorvastatin] Other (See Comments)    SEVERE HEADACHE  . Simvastatin Other (See Comments)    MUSCLE ACHES  . Penicillins Rash    Has patient had a PCN reaction causing immediate rash, facial/tongue/throat swelling, SOB or lightheadedness with hypotension: Yes Has patient had a PCN reaction causing severe rash involving mucus membranes or skin necrosis: No Has patient had a PCN reaction that required hospitalization No Has patient had a PCN reaction occurring within the last 10 years: No If all of the above answers are "NO", then may proceed with Cephalosporin use.   . Sulfa Antibiotics Rash    Tolerates silver sulfadiazine cream at home    Current Outpatient Prescriptions  Medication Sig Dispense Refill  . amLODipine (NORVASC) 10 MG tablet Take 1 tablet (10 mg total) by mouth daily. 90 tablet 3  . aspirin 81 MG tablet Take 81 mg by mouth 2 (two) times a week.     . Difluprednate (DUREZOL) 0.05 % EMUL Apply 1 drop to eye 2 (two) times daily.    . feeding supplement (BOOST / RESOURCE BREEZE) LIQD Take 1 Container by mouth 3 (three) times daily between meals. 90 Container 4  . ferrous sulfate 325 (65 FE) MG tablet Take 65 mg by mouth daily with breakfast.    . fish oil-omega-3 fatty acids 1000 MG capsule Take 1 capsule by mouth daily.      Marland Kitchen losartan-hydrochlorothiazide (HYZAAR) 100-25 MG tablet Take 1 tablet by mouth daily. 90 tablet 3  . Multiple Vitamins-Minerals (CENTRUM SILVER PO) Take 1 tablet by  mouth daily.      . nebivolol (BYSTOLIC) 5 MG tablet Take 1 tablet (5 mg total) by mouth daily. 90 tablet 3  . Probiotic Product (PHILLIPS COLON HEALTH PO) Take 1 capsule by mouth daily.     . silver sulfADIAZINE (SILVADENE) 1 % cream Apply topically daily. (Patient taking differently: Apply 1 application topically daily. ) 50 g 0  . warfarin (JANTOVEN) 2.5 MG tablet Take as directed by [facility] 90 tablet 1   No current facility-administered medications for this visit.     Past Medical History:  Diagnosis Date  . Arteriosclerotic cardiovascular disease (ASCVD)    CABG in 1990s; 2002 total obstruction of LAD, CX and RCA with patent grafts and nl EF; Stress nuc. 2008 - mild LV dilation; normal EF; questionable small anteroapical scar; no ischemia  . Arthritis    "left knee" (11/08/2015)  . Atrial fibrillation ([place])   . Cellulitis 11/08/2015   "both feet"  . Chronic anticoagulation   . Dental crowns present   . Diabetic peripheral neuropathy (HCC)    bilateral lower leg, hands  . History of blood transfusion    "related to my leg surgery?"  . Hypertension    states under control with meds., has been on med. x "long time"  . Iron deficiency anemia   . Jaw cancer ([place]) 1990s   "squamous cell"  . Myocardial infarction 1990s   "after my jaw OR"  . Pedal edema    "not  a lot", per pt.  . Peripheral vascular disease ([place])   . Presence of retained hardware 07/2015   failed hardware jaw  . Runny nose 07/27/2015   clear drainage, per pt.  . Type II diabetes mellitus ([place])    "diet controlled" (11/08/2015)    Past Surgical History:  Procedure Laterality Date  . AMPUTATION Left 11/11/2015   Procedure: LEFT ABOVE KNEE AMPUTATION;  Surgeon: Angelia Mould, MD;  Location: [facility];  Service: Vascular;  Laterality: Left;  . CARDIAC CATHETERIZATION  1990s; 04/16/2001  . CATARACT EXTRACTION W/ INTRAOCULAR LENS  IMPLANT, BILATERAL Bilateral 09/2015  . COLONOSCOPY N/A 11/27/2014    Procedure: COLONOSCOPY;  Surgeon: Rogene Houston, MD;  [facility];  Service: Endoscopy;  Laterality: N/A;  830  . CORONARY ARTERY BYPASS GRAFT  1990's   "CABG X4"  . FRACTURE SURGERY    . I&D EXTREMITY Left 11/10/2015   Procedure: INCISION AND DRAINAGE LEFT FOOT, AMPUTATION OF LEFT THIRD TOE;  Surgeon: Conrad Palo, MD;  Location: [facility];  Service: Vascular;  Laterality: Left;  . INCISION AND DRAINAGE ABSCESS Left 06/16/2005   wide exc. abscess 5th toe  . MANDIBULAR HARDWARE REMOVAL Left 08/02/2015   Procedure:  HARDWARE REMOVAL TWO MANDIBULAR SCREWS;  Surgeon: Jannette Fogo, DDS;  Location: [facility];  Service: Oral Surgery;  Laterality: Left;  . ORIF TIBIA FRACTURE Left ~ 1970  . SQUAMOUS CELL CARCINOMA EXCISION Left 1993   "took my jaw out; got cadavar in there now"  . TONSILLECTOMY      Social History   Social History  . Marital status: Married    Spouse name: N/A  . Number of children: N/A  . Years of education: N/A   Occupational History  . Not on file.   Social History Main Topics  . Smoking status: Never Smoker  . Smokeless tobacco: Never Used  . Alcohol use No  . Drug use: No  . Sexual activity: Yes   Other Topics Concern  . Not on file   Social History Narrative  . No narrative on file     Vitals:   07/24/16 0929  BP: 116/66  Pulse: 90  SpO2: 98%  Weight: 187 lb (84.8 kg)  Height: 5' 8"  (1.727 m)    PHYSICAL EXAM General: NAD HEENT: Normal. Neck: No JVD, no thyromegaly. Lungs: Clear to auscultation bilaterally with normal respiratory effort. CV: Nondisplaced PMI.  Regular rate and irregular rhythm, normal S1/S2, no S3/, no murmur. No right leg edema. Left AKA. No carotid bruit.   Abdomen: Soft, nontender, no distention.  Neurologic: Alert and oriented.  Psych: Normal affect. Skin: Normal. Musculoskeletal: No gross deformities.    ECG: Most recent ECG reviewed.      ASSESSMENT AND PLAN: 1. CAD: Stable ischemic  heart disease. Taking Bystolic and ASA. Unable to tolerate statins. Will stop Bystolic and start metoprolol 25 mg BID.  2. Atrial fibrillation: Rate controlled on Bystolic and anticoagulated with warfarin. Will stop Bystolic and start metoprolol 25 mg BID.   3. Essential HTN: Controlled. Monitor given switch from Bystolic to metoprolol.  4. Hyperlipidemia: Unable to tolerate statins.  Possibly a candidate for PCSK-9 inhibitor. Obtain copy of lipids from PCP.  5. PVD: Continue ASA, unable to tolerate statins. Followed by vascular surgery.  Dispo: fu 1 year.   Kate Sable, M.D., F.A.C.C.

## 2016-07-24 NOTE — Patient Instructions (Signed)
Your physician wants you to follow-up in: 1 year You will receive a reminder letter in the mail two months in advance. If you don't receive a letter, please call our office to schedule the follow-up appointment.     STOP Bystolic    START Metoprolol 25 mg twice a day     If you need a refill on your cardiac medications before your next appointment, please call your pharmacy.       Thank you for choosing [facility] !

## 2016-08-09 ENCOUNTER — Encounter: Payer: Self-pay | Admitting: *Deleted

## 2016-08-09 ENCOUNTER — Other Ambulatory Visit: Payer: Self-pay | Admitting: *Deleted

## 2016-08-10 ENCOUNTER — Other Ambulatory Visit (HOSPITAL_COMMUNITY)
Admission: RE | Admit: 2016-08-10 | Discharge: 2016-08-10 | Disposition: A | Discharge status: Home / Self Care | Payer: Medicare Other | Source: Ambulatory Visit | Attending: Vascular Surgery | Admitting: Vascular Surgery

## 2016-08-10 ENCOUNTER — Other Ambulatory Visit (HOSPITAL_COMMUNITY)
Admission: RE | Admit: 2016-08-10 | Discharge: 2016-08-10 | Disposition: A | Discharge status: Home / Self Care | Payer: Medicare Other | Source: Ambulatory Visit | Attending: Family Medicine | Admitting: Family Medicine

## 2016-08-10 DIAGNOSIS — I7025 Atherosclerosis of native arteries of other extremities with ulceration: Secondary | ICD-10-CM | POA: Diagnosis present

## 2016-08-10 DIAGNOSIS — I482 Chronic atrial fibrillation: Secondary | ICD-10-CM | POA: Insufficient documentation

## 2016-08-10 DIAGNOSIS — Z7901 Long term (current) use of anticoagulants: Secondary | ICD-10-CM | POA: Insufficient documentation

## 2016-08-10 DIAGNOSIS — E1159 Type 2 diabetes mellitus with other circulatory complications: Secondary | ICD-10-CM | POA: Diagnosis present

## 2016-08-10 DIAGNOSIS — E1142 Type 2 diabetes mellitus with diabetic polyneuropathy: Secondary | ICD-10-CM | POA: Diagnosis present

## 2016-08-10 LAB — BASIC METABOLIC PANEL
Anion gap: 7 (ref 5–15)
BUN: 20 mg/dL (ref 6–20)
CALCIUM: 8.4 mg/dL — AB (ref 8.9–10.3)
CO2: 22 mmol/L (ref 22–32)
CREATININE: 0.83 mg/dL (ref 0.61–1.24)
Chloride: 109 mmol/L (ref 101–111)
GFR calc non Af Amer: >60 mL/min (ref >60)
Glucose, Bld: 99 mg/dL (ref 65–99)
Potassium: 4 mmol/L (ref 3.5–5.1)
Sodium: 138 mmol/L (ref 135–145)

## 2016-08-10 LAB — HEMOGLOBIN AND HEMATOCRIT, BLOOD
HEMATOCRIT: 38 % — AB (ref 39.0–52.0)
Hemoglobin: 12.9 g/dL — ABNORMAL LOW (ref 13.0–17.0)

## 2016-08-10 LAB — PROTIME-INR
INR: 2.55
Prothrombin Time: 27.9 seconds — ABNORMAL HIGH (ref 11.4–15.2)

## 2016-08-11 LAB — HEMOGLOBIN A1C
HEMOGLOBIN A1C: 5.6 % (ref 4.8–5.6)
Mean Plasma Glucose: 114 mg/dL

## 2016-08-14 ENCOUNTER — Encounter (HOSPITAL_COMMUNITY)
Admission: RE | Disposition: A | Discharge status: Home / Self Care | Payer: Self-pay | Source: Ambulatory Visit | Attending: Vascular Surgery

## 2016-08-14 ENCOUNTER — Telehealth: Payer: Self-pay | Admitting: Vascular Surgery

## 2016-08-14 ENCOUNTER — Ambulatory Visit (HOSPITAL_COMMUNITY)
Admission: RE | Admit: 2016-08-14 | Discharge: 2016-08-14 | Disposition: A | Discharge status: Home / Self Care | Payer: Medicare Other | Source: Ambulatory Visit | Attending: Vascular Surgery | Admitting: Vascular Surgery

## 2016-08-14 ENCOUNTER — Encounter (HOSPITAL_COMMUNITY): Payer: Self-pay | Admitting: Vascular Surgery

## 2016-08-14 DIAGNOSIS — D509 Iron deficiency anemia, unspecified: Secondary | ICD-10-CM | POA: Insufficient documentation

## 2016-08-14 DIAGNOSIS — X58XXXA Exposure to other specified factors, initial encounter: Secondary | ICD-10-CM | POA: Insufficient documentation

## 2016-08-14 DIAGNOSIS — E1142 Type 2 diabetes mellitus with diabetic polyneuropathy: Secondary | ICD-10-CM | POA: Diagnosis not present

## 2016-08-14 DIAGNOSIS — I70202 Unspecified atherosclerosis of native arteries of extremities, left leg: Secondary | ICD-10-CM | POA: Diagnosis not present

## 2016-08-14 DIAGNOSIS — Z880 Allergy status to penicillin: Secondary | ICD-10-CM | POA: Insufficient documentation

## 2016-08-14 DIAGNOSIS — Z7901 Long term (current) use of anticoagulants: Secondary | ICD-10-CM | POA: Diagnosis not present

## 2016-08-14 DIAGNOSIS — Z888 Allergy status to other drugs, medicaments and biological substances status: Secondary | ICD-10-CM | POA: Insufficient documentation

## 2016-08-14 DIAGNOSIS — I252 Old myocardial infarction: Secondary | ICD-10-CM | POA: Insufficient documentation

## 2016-08-14 DIAGNOSIS — Z882 Allergy status to sulfonamides status: Secondary | ICD-10-CM | POA: Diagnosis not present

## 2016-08-14 DIAGNOSIS — Z79899 Other long term (current) drug therapy: Secondary | ICD-10-CM | POA: Diagnosis not present

## 2016-08-14 DIAGNOSIS — Z7982 Long term (current) use of aspirin: Secondary | ICD-10-CM | POA: Diagnosis not present

## 2016-08-14 DIAGNOSIS — Z89612 Acquired absence of left leg above knee: Secondary | ICD-10-CM | POA: Insufficient documentation

## 2016-08-14 DIAGNOSIS — Z951 Presence of aortocoronary bypass graft: Secondary | ICD-10-CM | POA: Diagnosis not present

## 2016-08-14 DIAGNOSIS — M1712 Unilateral primary osteoarthritis, left knee: Secondary | ICD-10-CM | POA: Diagnosis not present

## 2016-08-14 DIAGNOSIS — I2510 Atherosclerotic heart disease of native coronary artery without angina pectoris: Secondary | ICD-10-CM | POA: Diagnosis not present

## 2016-08-14 DIAGNOSIS — I4891 Unspecified atrial fibrillation: Secondary | ICD-10-CM | POA: Insufficient documentation

## 2016-08-14 DIAGNOSIS — I739 Peripheral vascular disease, unspecified: Secondary | ICD-10-CM | POA: Diagnosis not present

## 2016-08-14 DIAGNOSIS — I10 Essential (primary) hypertension: Secondary | ICD-10-CM | POA: Insufficient documentation

## 2016-08-14 DIAGNOSIS — S81801A Unspecified open wound, right lower leg, initial encounter: Secondary | ICD-10-CM | POA: Insufficient documentation

## 2016-08-14 DIAGNOSIS — I771 Stricture of artery: Secondary | ICD-10-CM | POA: Diagnosis not present

## 2016-08-14 DIAGNOSIS — I70238 Atherosclerosis of native arteries of right leg with ulceration of other part of lower right leg: Secondary | ICD-10-CM | POA: Diagnosis not present

## 2016-08-14 HISTORY — PX: PERIPHERAL VASCULAR CATHETERIZATION: SHX172C

## 2016-08-14 LAB — POCT I-STAT, CHEM 8
BUN: 27 mg/dL — AB (ref 6–20)
CALCIUM ION: 1.12 mmol/L — AB (ref 1.15–1.40)
CREATININE: 0.9 mg/dL (ref 0.61–1.24)
Chloride: 107 mmol/L (ref 101–111)
Glucose, Bld: 100 mg/dL — ABNORMAL HIGH (ref 65–99)
HCT: 40 % (ref 39.0–52.0)
Hemoglobin: 13.6 g/dL (ref 13.0–17.0)
Potassium: 3.9 mmol/L (ref 3.5–5.1)
Sodium: 140 mmol/L (ref 135–145)
TCO2: 21 mmol/L (ref 0–100)

## 2016-08-14 LAB — PROTIME-INR
INR: 1.39
PROTHROMBIN TIME: 17.2 s — AB (ref 11.4–15.2)

## 2016-08-14 LAB — GLUCOSE, CAPILLARY: Glucose-Capillary: 91 mg/dL (ref 65–99)

## 2016-08-14 SURGERY — ABDOMINAL AORTOGRAM W/LOWER EXTREMITY

## 2016-08-14 MED ORDER — HYDRALAZINE HCL 20 MG/ML IJ SOLN: 10.0000 mg | INTRAMUSCULAR | Status: DC | PRN

## 2016-08-14 MED ORDER — FENTANYL CITRATE (PF) 100 MCG/2ML IJ SOLN
INTRAMUSCULAR | Status: DC | PRN
  Administered 2016-08-14: 50 ug via INTRAVENOUS

## 2016-08-14 MED ORDER — LIDOCAINE HCL (PF) 1 % IJ SOLN
INTRAMUSCULAR | Status: DC | PRN
  Administered 2016-08-14: 13 mL via SUBCUTANEOUS

## 2016-08-14 MED ORDER — MIDAZOLAM HCL 2 MG/2ML IJ SOLN
INTRAMUSCULAR | Status: AC
  Filled 2016-08-14: qty 2

## 2016-08-14 MED ORDER — HEPARIN (PORCINE) IN NACL 2-0.9 UNIT/ML-% IJ SOLN
INTRAMUSCULAR | Status: AC
  Filled 2016-08-14: qty 1000

## 2016-08-14 MED ORDER — SODIUM CHLORIDE 0.9 % IV SOLN: 1.0000 mL/kg/h | INTRAVENOUS | Status: DC

## 2016-08-14 MED ORDER — FENTANYL CITRATE (PF) 100 MCG/2ML IJ SOLN
INTRAMUSCULAR | Status: AC
  Filled 2016-08-14: qty 2

## 2016-08-14 MED ORDER — SODIUM CHLORIDE 0.9 % IV SOLN
INTRAVENOUS | Status: DC
  Administered 2016-08-14: 08:00:00 via INTRAVENOUS

## 2016-08-14 MED ORDER — MIDAZOLAM HCL 2 MG/2ML IJ SOLN
INTRAMUSCULAR | Status: DC | PRN
  Administered 2016-08-14: 1 mg via INTRAVENOUS

## 2016-08-14 MED ORDER — HEPARIN (PORCINE) IN NACL 2-0.9 UNIT/ML-% IJ SOLN
INTRAMUSCULAR | Status: DC | PRN
  Administered 2016-08-14: 1000 mL via INTRA_ARTERIAL

## 2016-08-14 MED ORDER — LIDOCAINE HCL (PF) 1 % IJ SOLN
INTRAMUSCULAR | Status: AC
Start: 2016-08-14 — End: 2016-08-14
  Filled 2016-08-14: qty 30

## 2016-08-14 SURGICAL SUPPLY — 10 items
CATH ANGIO 5F PIGTAIL 65CM (CATHETERS) ×2 IMPLANT
COVER PRB 48X5XTLSCP FOLD TPE (BAG) IMPLANT
COVER PROBE 5X48 (BAG) ×3
KIT MICROINTRODUCER STIFF 5F (SHEATH) ×2 IMPLANT
KIT PV (KITS) ×3 IMPLANT
SHEATH PINNACLE 5F 10CM (SHEATH) ×2 IMPLANT
SYR MEDRAD MARK V 150ML (SYRINGE) ×3 IMPLANT
TRANSDUCER W/STOPCOCK (MISCELLANEOUS) ×3 IMPLANT
TRAY PV CATH (CUSTOM PROCEDURE TRAY) ×3 IMPLANT
WIRE HITORQ VERSACORE ST 145CM (WIRE) ×2 IMPLANT

## 2016-08-14 NOTE — Progress Notes (Addendum)
Site Area: LFA Site Prior to Removal: Level 0 Pressure Applied For:   20 minutes Manual:yes Patient Status During Pull: stable Post Pull Site: Level 0 Post Pull Instructions Given: YES Post Pull Pulses Present: AKA Dressing Applied:tegaderm Bedrest begins @ 1030 till 1430 Comments:  Removed by  [facility]

## 2016-08-14 NOTE — Discharge Instructions (Signed)
Groin Surgical Site Care °Refer to this sheet in the next few weeks. These instructions provide you with information about caring for yourself after your procedure. Your health care provider may also give you more specific instructions. Your treatment has been planned according to current medical practices, but problems sometimes occur. Call your health care provider if you have any problems or questions after your procedure. °WHAT TO EXPECT AFTER THE PROCEDURE °After your procedure, it is typical to have the following: °· Bruising at the groin site that usually fades within 1-2 weeks. °· Blood collecting in the tissue (hematoma) that may be painful to the touch. It should usually decrease in size and tenderness within 1-2 weeks. °HOME CARE INSTRUCTIONS °· Take medicines only as directed by your health care provider. °· You may shower 24-48 hours after the procedure or as directed by your health care provider. Remove the bandage (dressing) and gently wash the site with plain soap and water. Pat the area dry with a clean towel. Do not rub the site, because this may cause bleeding. °· Do not take baths, swim, or use a hot tub until your health care provider approves. °· Check your insertion site every day for redness, swelling, or drainage. °· Do not apply powder or lotion to the site. °· Limit use of stairs to twice a day for the first 2-3 days or as directed by your health care provider. °· Do not squat for the first 2-3 days or as directed by your health care provider. °· Do not lift over 10 lb (4.5 kg) for 5 days after your procedure or as directed by your health care provider. °· Ask your health care provider when it is okay to: °¨ Return to work or school. °¨ Resume usual physical activities or sports. °¨ Resume sexual activity. °· Do not drive home if you are discharged the same day as the procedure. Have someone else drive you. °· You may drive 24 hours after the procedure unless otherwise instructed by your  health care provider. °· Do not operate machinery or power tools for 24 hours after the procedure or as directed by your health care provider. °· If your procedure was done as an outpatient procedure, which means that you went home the same day as your procedure, a responsible adult should be with you for the first 24 hours after you arrive home. °· Keep all follow-up visits as directed by your health care provider. This is important. °SEEK MEDICAL CARE IF: °· You have a fever. °· You have chills. °· You have increased bleeding from the groin site. Hold pressure on the site. °SEEK IMMEDIATE MEDICAL CARE IF: °· You have unusual pain at the groin site. °· You have redness, warmth, or swelling at the groin site. °· You have drainage (other than a small amount of blood on the dressing) from the groin site. °· The groin site is bleeding, and the bleeding does not stop after 30 minutes of holding steady pressure on the site. °· Your leg or foot becomes pale, cool, tingly, or numb. °  °This information is not intended to replace advice given to you by your health care provider. Make sure you discuss any questions you have with your health care provider. °  °Document Released: 06/12/2014 Document Reviewed: 06/12/2014 °Elsevier Interactive Patient Education ©2016 [employer] ° °

## 2016-08-14 NOTE — Telephone Encounter (Signed)
-----   Message from Mena Goes, RN sent at 08/14/2016 10:36 AM EDT ----- Regarding: 3 months, no labs   ----- Message ----- From: Angelia Mould, MD Sent: 08/14/2016  10:01 AM To: [facility] Charge Pool Subject: charge and f/u                                 PROCEDURE:  1. Ultrasound-guided access to the left common femoral artery 2. Aortogram with bilateral lower extremity runoff  SURGEON: Judeth Cornfield. Scot Dock, MD, FACS  He will need a follow up visit in 3 months. No lab study is needed. Thank you. CD

## 2016-08-14 NOTE — H&P (Addendum)
Patient name: [name] MRN: [id number]        DOB: 05-18-1936        Sex: male  REASON FOR ADMISSION:  Right leg wound.  HPI: [name] is a 80 y.o. male who had originally presented with a rapidly progressive infection of his left lower extremity and required a left above-the-knee amputation. I last saw him in February of this year and this was healing well and he was set up to get a prosthesis. I set him up for 6 month follow up visit so that we can follow his right leg closely.  Since I saw him last, he has obtained a prosthesis however he prefers to be in a wheelchair. He denies any symptoms in his right leg. He did fall however and break his right second toe which is healing. He denies rest pain in the right lower extremity. He denies any history of nonhealing ulcers. He is not a smoker.       Past Medical History:  Diagnosis Date  . Arteriosclerotic cardiovascular disease (ASCVD)    CABG in 1990s; 2002 total obstruction of LAD, CX and RCA with patent grafts and nl EF; Stress nuc. 2008 - mild LV dilation; normal EF; questionable small anteroapical scar; no ischemia  . Arthritis    "left knee" (11/08/2015)  . Atrial fibrillation ([place])   . Cellulitis 11/08/2015   "both feet"  . Chronic anticoagulation   . Dental crowns present   . Diabetic peripheral neuropathy (HCC)    bilateral lower leg, hands  . History of blood transfusion    "related to my leg surgery?"  . Hypertension    states under control with meds., has been on med. x "long time"  . Iron deficiency anemia   . Jaw cancer ([place]) 1990s   "squamous cell"  . Myocardial infarction ([place]) 1990s   "after my jaw OR"  . Pedal edema    "not a lot", per pt.  . Peripheral vascular disease ([place])   . Presence of retained hardware 07/2015   failed hardware jaw  . Runny nose 07/27/2015   clear drainage, per pt.  . Type II diabetes mellitus ([place])    "diet controlled" (11/08/2015)      Family History  Problem Relation Age of Onset  . Heart disease Mother     SOCIAL HISTORY: Social History  Substance Use Topics  . Smoking status: Never Smoker  . Smokeless tobacco: Never Used  . Alcohol use No         Allergies  Allergen Reactions  . Lipitor [Atorvastatin] Other (See Comments)    SEVERE HEADACHE  . Simvastatin Other (See Comments)    MUSCLE ACHES  . Penicillins Rash    Has patient had a PCN reaction causing immediate rash, facial/tongue/throat swelling, SOB or lightheadedness with hypotension: Yes Has patient had a PCN reaction causing severe rash involving mucus membranes or skin necrosis: No Has patient had a PCN reaction that required hospitalization No Has patient had a PCN reaction occurring within the last 10 years: No If all of the above answers are "NO", then may proceed with Cephalosporin use.  . Sulfa Antibiotics Rash    Tolerates silver sulfadiazine cream at home          Current Outpatient Prescriptions  Medication Sig Dispense Refill  . amLODipine (NORVASC) 10 MG tablet Take 1 tablet (10 mg total) by mouth daily. 90 tablet 3  . aspirin 81 MG tablet Take 81  mg by mouth 2 (two) times a week.     . Difluprednate (DUREZOL) 0.05 % EMUL Apply 1 drop to eye 2 (two) times daily.    . feeding supplement (BOOST / RESOURCE BREEZE) LIQD Take 1 Container by mouth 3 (three) times daily between meals. 90 Container 4  . ferrous sulfate 325 (65 FE) MG tablet Take 65 mg by mouth daily with breakfast.    . fish oil-omega-3 fatty acids 1000 MG capsule Take 1 capsule by mouth daily.      Marland Kitchen losartan-hydrochlorothiazide (HYZAAR) 100-25 MG tablet Take 1 tablet by mouth daily. 90 tablet 3  . Multiple Vitamins-Minerals (CENTRUM SILVER PO) Take 1 tablet by mouth daily.      . nebivolol (BYSTOLIC) 5 MG tablet Take 1 tablet (5 mg total) by mouth daily. 90 tablet 3  . Probiotic Product (PHILLIPS COLON HEALTH PO) Take 1 capsule by mouth daily.      . silver sulfADIAZINE (SILVADENE) 1 % cream Apply topically daily. (Patient taking differently: Apply 1 application topically daily. ) 50 g 0  . warfarin (JANTOVEN) 2.5 MG tablet Take 1.25-2.5 mg by mouth See admin instructions. 1.25 mg ( 1/2 tablet  of 2.5 mg) on Sunday, Monday, Wednesday, Friday. All other days patient takes 2.5 mg    . warfarin (COUMADIN) 2 MG tablet Take 1 tablet (2 mg total) by mouth daily. (Patient not taking: Reported on 06/07/2016) 30 tablet 3   No current facility-administered medications for this visit.     REVIEW OF SYSTEMS:  [X]  denotes positive finding, [ ]  denotes negative finding Cardiac  Comments:  Chest pain or chest pressure:    Shortness of breath upon exertion:    Short of breath when lying flat:    Irregular heart rhythm: X       Vascular    Pain in calf, thigh, or hip brought on by ambulation:    Pain in feet at night that wakes you up from your sleep:     Blood clot in your veins:    Leg swelling:         Pulmonary    Oxygen at home:    Productive cough:     Wheezing:         Neurologic    Sudden weakness in arms or legs:     Sudden numbness in arms or legs:     Sudden onset of difficulty speaking or slurred speech:    Temporary loss of vision in one eye:     Problems with dizziness:         Gastrointestinal    Blood in stool:     Vomited blood:         Genitourinary    Burning when urinating:     Blood in urine:        Psychiatric    Major depression:         Hematologic    Bleeding problems:    Problems with blood clotting too easily:        Skin    Rashes or ulcers:        Constitutional    Fever or chills:      PHYSICAL EXAM:     Vitals:   06/07/16 1547 06/07/16 1550  BP: (!) 156/70 (!) 154/75  Pulse: (!) 57   SpO2: 98%   Weight: 185 lb (83.9 kg)   Height: 5' 8.5" (1.74 m)    Vitals:   08/14/16 0703   BP: Marland Kitchen)  174/72  Pulse: (!) 57  Resp: 16  Temp: 97.8 F (36.6 C)     GENERAL: The patient is a well-nourished male, in no acute distress. The vital signs are documented above. CARDIAC: There is a regular rate and rhythm.  VASCULAR: I do not detect carotid bruits. He has a palpable right femoral pulse. I cannot palpate pedal pulses. Right foot appears warm and well perfused. PULMONARY: There is good air exchange bilaterally without wheezing or rales. ABDOMEN: Soft and non-tender with normal pitched bowel sounds.  MUSCULOSKELETAL: His left AKA is healed. NEUROLOGIC: No focal weakness or paresthesias are detected. SKIN: There are no ulcers or rashes noted. PSYCHIATRIC: The patient has a normal affect.  DATA:   LOWER EXTREMITY ARTERIAL DOPPLER: I have inability interpreted his lower extremity arterial Doppler study of the right lower extremity. He has a biphasic posterior tibial signal with the Doppler and a monophasic dorsalis pedis signal. His arteries are not compressible and an ABI could not be obtained however the toe pressure on the right is 99 mmHg.  MEDICAL ISSUES:  PERIPHERAL VASCULAR DISEASE: the patient has no symptoms in the right lower extremity. His Doppler suggests good perfusion on the right although his arteries are calcified. Given the rapidly progressive infection of the left leg, I would recommend continued follow up of the right leg and I will see him back in 1 year with ABIs. He knows to call sooner if he has problems. Of note, I have encouraged him to use his prosthesis and try to ambulate as much as possible.  UPDATE: Since the patient was seen in the office in August, he developed a wound on his right pretibial area. His primary care physician called and I felt the most expedient way to assess his circulation was to arrange for an arteriogram. He presents for arteriography of the right lower extremity. The procedure and risks have been discussed with the patient  and he is agreeable to proceed.  Deitra Mayo [facility] [phone]

## 2016-08-14 NOTE — Op Note (Signed)
   PATIENT: [name]   MRN: [id number] DOB: 01/13/1936    DATE OF PROCEDURE: 08/14/2016  INDICATIONS: ELLA GUILLOTTE is a 80 y.o. male who had developed nonhealing wounds on the right leg. He was set up for arteriography. He has an AKA on the left.  PROCEDURE:  1. Ultrasound-guided access to the left common femoral artery 2. Aortogram with bilateral lower extremity runoff  SURGEON: Judeth Cornfield. Scot Dock, MD, FACS  ANESTHESIA: Local with sedation   EBL: Minimal  TECHNIQUE: The patient was taken to the peripheral vascular lab and was sedated. The period of conscious sedation was 36 minutes..  During that time period, I was present face-to-face 100% of the time.  The patient was administered (1 mg of Versed and 50 g of fentanyl.). The patient's heart rate, blood pressure, and oxygen saturation were monitored by the nurse continuously during the procedure.  Both groins were prepped and draped in usual sterile fashion. Under ultrasound guidance, after the skin was anesthetized, the left common femoral artery was cannulated with a micropuncture needle a micro-puncture sheath introduced over a wire. This was exchanged for a 5 French sheath over a Versed cord wire. The pigtail catheter was positioned at the L1 vertebral body and flush aortogram obtained. The catheter was positioned above the aortic bifurcation and oblique iliac projection was obtained. Next right lower extremity runoff film was obtained.  At the completion of the procedure, the catheter was removed over a wire. The patient was transferred to the holding area for removal of the sheath.   FINDINGS:  1. The infrarenal aorta is widely patent. Both renal arteries are patent. There is an eccentric 80% stenosis in the left common iliac artery on the side of his AKA. 2. On the right side, which is the site of concern, the common iliac artery, external iliac artery, hypogastric artery, common femoral artery, deep femoral artery,  superficial femoral artery, popliteal artery, peroneal artery, and posterior tibial arteries are all patent. The anterior tibial artery is occluded.   CLINICAL NOTE: The patient should have adequate circulation to heal the wounds on his right leg. I will arrange a follow up visit in 3 months. He no skull sooner if he has problems.  Deitra Mayo, MD, FACS [facility]  DATE OF DICTATION:   08/14/2016

## 2016-08-14 NOTE — Telephone Encounter (Signed)
LVM on home #, mailing letter for appt on 11/17/15

## 2016-08-23 ENCOUNTER — Ambulatory Visit (INDEPENDENT_AMBULATORY_CARE_PROVIDER_SITE_OTHER): Payer: Medicare Other | Admitting: *Deleted

## 2016-08-23 DIAGNOSIS — I482 Chronic atrial fibrillation: Secondary | ICD-10-CM

## 2016-08-23 DIAGNOSIS — Z5181 Encounter for therapeutic drug level monitoring: Secondary | ICD-10-CM | POA: Diagnosis not present

## 2016-08-23 DIAGNOSIS — I4821 Permanent atrial fibrillation: Secondary | ICD-10-CM

## 2016-08-23 LAB — POCT INR: INR: 2.6

## 2016-09-29 ENCOUNTER — Other Ambulatory Visit: Payer: Self-pay

## 2016-10-04 ENCOUNTER — Ambulatory Visit (INDEPENDENT_AMBULATORY_CARE_PROVIDER_SITE_OTHER): Payer: Medicare Other | Admitting: *Deleted

## 2016-10-04 DIAGNOSIS — I482 Chronic atrial fibrillation: Secondary | ICD-10-CM

## 2016-10-04 DIAGNOSIS — I2510 Atherosclerotic heart disease of native coronary artery without angina pectoris: Secondary | ICD-10-CM

## 2016-10-04 DIAGNOSIS — I4821 Permanent atrial fibrillation: Secondary | ICD-10-CM

## 2016-10-04 DIAGNOSIS — Z5181 Encounter for therapeutic drug level monitoring: Secondary | ICD-10-CM

## 2016-10-04 LAB — POCT INR: INR: 2.1

## 2016-10-18 ENCOUNTER — Ambulatory Visit (HOSPITAL_COMMUNITY)
Admission: RE | Admit: 2016-10-18 | Discharge: 2016-10-18 | Disposition: A | Discharge status: Home / Self Care | Payer: Medicare Other | Source: Ambulatory Visit | Attending: Family Medicine | Admitting: Family Medicine

## 2016-10-18 ENCOUNTER — Other Ambulatory Visit (HOSPITAL_COMMUNITY): Payer: Self-pay | Admitting: Family Medicine

## 2016-10-18 DIAGNOSIS — R319 Hematuria, unspecified: Secondary | ICD-10-CM | POA: Insufficient documentation

## 2016-10-18 DIAGNOSIS — R229 Localized swelling, mass and lump, unspecified: Secondary | ICD-10-CM | POA: Insufficient documentation

## 2016-10-18 DIAGNOSIS — M7989 Other specified soft tissue disorders: Secondary | ICD-10-CM

## 2016-10-18 DIAGNOSIS — N281 Cyst of kidney, acquired: Secondary | ICD-10-CM | POA: Diagnosis not present

## 2016-10-19 ENCOUNTER — Ambulatory Visit (HOSPITAL_COMMUNITY): Payer: Medicare Other

## 2016-10-29 ENCOUNTER — Emergency Department (HOSPITAL_COMMUNITY): Payer: Medicare Other

## 2016-10-29 ENCOUNTER — Emergency Department (HOSPITAL_COMMUNITY)
Admission: EM | Admit: 2016-10-29 | Discharge: 2016-10-29 | Disposition: A | Discharge status: Home / Self Care | Payer: Medicare Other | Attending: Emergency Medicine | Admitting: Emergency Medicine

## 2016-10-29 ENCOUNTER — Encounter (HOSPITAL_COMMUNITY): Payer: Self-pay | Admitting: Emergency Medicine

## 2016-10-29 DIAGNOSIS — R109 Unspecified abdominal pain: Secondary | ICD-10-CM | POA: Diagnosis present

## 2016-10-29 DIAGNOSIS — Z955 Presence of coronary angioplasty implant and graft: Secondary | ICD-10-CM | POA: Insufficient documentation

## 2016-10-29 DIAGNOSIS — Z7901 Long term (current) use of anticoagulants: Secondary | ICD-10-CM | POA: Insufficient documentation

## 2016-10-29 DIAGNOSIS — Z85828 Personal history of other malignant neoplasm of skin: Secondary | ICD-10-CM | POA: Diagnosis not present

## 2016-10-29 DIAGNOSIS — Z7982 Long term (current) use of aspirin: Secondary | ICD-10-CM | POA: Diagnosis not present

## 2016-10-29 DIAGNOSIS — N200 Calculus of kidney: Secondary | ICD-10-CM | POA: Diagnosis not present

## 2016-10-29 DIAGNOSIS — Z79899 Other long term (current) drug therapy: Secondary | ICD-10-CM | POA: Insufficient documentation

## 2016-10-29 DIAGNOSIS — I25810 Atherosclerosis of coronary artery bypass graft(s) without angina pectoris: Secondary | ICD-10-CM | POA: Diagnosis not present

## 2016-10-29 DIAGNOSIS — E119 Type 2 diabetes mellitus without complications: Secondary | ICD-10-CM | POA: Insufficient documentation

## 2016-10-29 DIAGNOSIS — I10 Essential (primary) hypertension: Secondary | ICD-10-CM | POA: Insufficient documentation

## 2016-10-29 LAB — CBC WITH DIFFERENTIAL/PLATELET
BASOS PCT: 0 %
Basophils Absolute: 0 10*3/uL (ref 0.0–0.1)
EOS PCT: 1 %
Eosinophils Absolute: 0.1 10*3/uL (ref 0.0–0.7)
HEMATOCRIT: 39.3 % (ref 39.0–52.0)
Hemoglobin: 13.1 g/dL (ref 13.0–17.0)
Lymphocytes Relative: 12 %
Lymphs Abs: 1.6 10*3/uL (ref 0.7–4.0)
MCH: 31.3 pg (ref 26.0–34.0)
MCHC: 33.3 g/dL (ref 30.0–36.0)
MCV: 94 fL (ref 78.0–100.0)
MONO ABS: 0.7 10*3/uL (ref 0.1–1.0)
MONOS PCT: 5 %
NEUTROS ABS: 11.1 10*3/uL — AB (ref 1.7–7.7)
Neutrophils Relative %: 82 %
PLATELETS: 345 10*3/uL (ref 150–400)
RBC: 4.18 MIL/uL — ABNORMAL LOW (ref 4.22–5.81)
RDW: 14.8 % (ref 11.5–15.5)
WBC: 13.6 10*3/uL — ABNORMAL HIGH (ref 4.0–10.5)

## 2016-10-29 LAB — URINALYSIS, ROUTINE W REFLEX MICROSCOPIC
Bilirubin Urine: NEGATIVE
GLUCOSE, UA: NEGATIVE mg/dL
KETONES UR: NEGATIVE mg/dL
LEUKOCYTES UA: NEGATIVE
NITRITE: NEGATIVE
PH: 5 (ref 5.0–8.0)
PROTEIN: 30 mg/dL — AB
Specific Gravity, Urine: 1.015 (ref 1.005–1.030)

## 2016-10-29 LAB — BASIC METABOLIC PANEL
ANION GAP: 10 (ref 5–15)
BUN: 26 mg/dL — AB (ref 6–20)
CO2: 20 mmol/L — AB (ref 22–32)
Calcium: 8 mg/dL — ABNORMAL LOW (ref 8.9–10.3)
Chloride: 109 mmol/L (ref 101–111)
Creatinine, Ser: 1.1 mg/dL (ref 0.61–1.24)
GFR calc Af Amer: >60 mL/min (ref >60)
GLUCOSE: 137 mg/dL — AB (ref 65–99)
Potassium: 2.9 mmol/L — ABNORMAL LOW (ref 3.5–5.1)
Sodium: 139 mmol/L (ref 135–145)

## 2016-10-29 LAB — PROTIME-INR
INR: 2.33
Prothrombin Time: 26 seconds — ABNORMAL HIGH (ref 11.4–15.2)

## 2016-10-29 MED ORDER — POTASSIUM CHLORIDE CRYS ER 20 MEQ PO TBCR
20.0000 meq | EXTENDED_RELEASE_TABLET | Freq: Once | ORAL | Status: AC
  Administered 2016-10-29: 20 meq via ORAL
  Filled 2016-10-29: qty 1

## 2016-10-29 MED ORDER — ONDANSETRON HCL 4 MG/2ML IJ SOLN
4.0000 mg | Freq: Once | INTRAMUSCULAR | Status: AC
  Administered 2016-10-29: 4 mg via INTRAVENOUS
  Filled 2016-10-29: qty 2

## 2016-10-29 MED ORDER — HYDROCODONE-ACETAMINOPHEN 5-325 MG PO TABS: 1.0000 | ORAL_TABLET | ORAL | 0 refills | Status: DC | PRN

## 2016-10-29 NOTE — ED Provider Notes (Addendum)
[facility] DEPT Provider Note   CSN: [id number] Arrival date & time: 10/29/16  0755     History   Chief Complaint Chief Complaint  Patient presents with  . Flank Pain    HPI [name] is a 81 y.o. male with past medical history as outlined below significant for anticoagulation therapy for a fib and cardiac bypass presenting with a several week history of urinary complaints.  He describes having left sided flank pain along with hematuria after Christmas, saw his pcp who sent him for a renal US which was negative for renal stone or mass.  His symptoms resolved and he has not yet been seen by urology.  This am when he woke he developed similar left lower back pain which radiates into his left lower abdomen, describes a low aching sensation along with nausea, but denies hematuria or dysuria today.  He urinated "just a little" this am, which did not improve or worsen his symptoms. He does not feel like his bladder is full.  The history is provided by the patient and the spouse.    Past Medical History:  Diagnosis Date  . Arteriosclerotic cardiovascular disease (ASCVD)    CABG in 1990s; 2002 total obstruction of LAD, CX and RCA with patent grafts and nl EF; Stress nuc. 2008 - mild LV dilation; normal EF; questionable small anteroapical scar; no ischemia  . Arthritis    "left knee" (11/08/2015)  . Atrial fibrillation ([place])   . Cellulitis 11/08/2015   "both feet"  . Chronic anticoagulation   . Dental crowns present   . Diabetic peripheral neuropathy (HCC)    bilateral lower leg, hands  . History of blood transfusion    "related to my leg surgery?"  . Hypertension    states under control with meds., has been on med. x "long time"  . Iron deficiency anemia   . Jaw cancer ([place]) 1990s   "squamous cell"  . Myocardial infarction 1990s   "after my jaw OR"  . Pedal edema    "not a lot", per pt.  . Peripheral vascular disease ([place])   . Presence of retained hardware 07/2015   failed hardware jaw  . Runny nose 07/27/2015   clear drainage, per pt.  . Type II diabetes mellitus ([place])    "diet controlled" (11/08/2015)    Patient Active Problem List   Diagnosis Date Noted  . Weight gain 11/30/2015  . Coronary artery disease involving coronary bypass graft of native heart without angina pectoris   . Tachycardia   . Leukocytosis   . Abnormality of gait   . Status post above knee amputation of left lower extremity ([place])   . Acute blood loss anemia   . Acute pulmonary edema (HCC)   . Acute congestive heart failure ([place])   . Acute respiratory failure with hypoxia ([place])   . Pressure ulcer 11/09/2015  . Necrotic toes ([place]) 11/08/2015  . Lower extremity cellulitis 11/08/2015  . Bradycardia 05/28/2015  . PVD (peripheral vascular disease) with claudication ([place]) 09/30/2014  . Encounter for therapeutic drug monitoring 11/26/2013  . Peripheral vascular disease ([place]) 08/06/2012  . Chronic anticoagulation 01/20/2011  . Diabetes mellitus ([place]) 03/04/2010  . Dyslipidemia 03/04/2010  . Essential hypertension 03/04/2010  . Hx of CABG 03/04/2010  . Permanent atrial fibrillation ([place]) 04/08/2009    Past Surgical History:  Procedure Laterality Date  . AMPUTATION Left 11/11/2015   Procedure: LEFT ABOVE KNEE AMPUTATION;  Surgeon: Angelia Mould, MD;  Location: [facility];  Service: Vascular;  Laterality: Left;  . CARDIAC CATHETERIZATION  1990s; 04/16/2001  . CATARACT EXTRACTION W/ INTRAOCULAR LENS  IMPLANT, BILATERAL Bilateral 09/2015  . COLONOSCOPY N/A 11/27/2014   Procedure: COLONOSCOPY;  Surgeon: Rogene Houston, MD;  [facility];  Service: Endoscopy;  Laterality: N/A;  830  . CORONARY ARTERY BYPASS GRAFT  1990's   "CABG X4"  . FRACTURE SURGERY    . I&D EXTREMITY Left 11/10/2015   Procedure: INCISION AND DRAINAGE LEFT FOOT, AMPUTATION OF LEFT THIRD TOE;  Surgeon: Conrad [place], MD;  Location: [facility];  Service: Vascular;  Laterality: Left;  . INCISION AND  DRAINAGE ABSCESS Left 06/16/2005   wide exc. abscess 5th toe  . MANDIBULAR HARDWARE REMOVAL Left 08/02/2015   Procedure:  HARDWARE REMOVAL TWO MANDIBULAR SCREWS;  Surgeon: Jannette Fogo, DDS;  Location: [facility];  Service: Oral Surgery;  Laterality: Left;  . ORIF TIBIA FRACTURE Left ~ 1970  . PERIPHERAL VASCULAR CATHETERIZATION N/A 08/14/2016   Procedure: Abdominal Aortogram w/Lower Extremity;  Surgeon: Angelia Mould, MD;  Location: [facility];  Service: Cardiovascular;  Laterality: N/A;  . SQUAMOUS CELL CARCINOMA EXCISION Left 1993   "took my jaw out; got cadavar in there now"  . TONSILLECTOMY         Home Medications    Prior to Admission medications   Medication Sig Start Date End Date Taking? Authorizing Provider  amLODipine (NORVASC) 10 MG tablet Take 1 tablet (10 mg total) by mouth daily. 04/04/16  Yes Herminio Commons, MD  aspirin EC 81 MG tablet Take 81 mg by mouth daily.   Yes Historical Provider, MD  Difluprednate (DUREZOL) 0.05 % EMUL Place 1 drop into both eyes 2 (two) times daily.    Yes Historical Provider, MD  doxycycline (VIBRAMYCIN) 100 MG capsule Take 1 capsule by mouth 2 (two) times daily. 10/20/16  Yes Historical Provider, MD  feeding supplement (BOOST / RESOURCE BREEZE) LIQD Take 1 Container by mouth 3 (three) times daily between meals. Patient taking differently: Take 1 Container by mouth 2 (two) times daily.  11/16/15  Yes Loleta Chance, MD  ferrous sulfate 325 (65 FE) MG tablet Take 65 mg by mouth daily with breakfast.   Yes Historical Provider, MD  halobetasol (ULTRAVATE) 0.05 % ointment Apply 1 application topically 2 (two) times daily.   Yes Historical Provider, MD  levofloxacin (LEVAQUIN) 500 MG tablet Take 1 tablet by mouth daily. 10/20/16  Yes Historical Provider, MD  losartan-hydrochlorothiazide (HYZAAR) 100-25 MG tablet Take 1 tablet by mouth daily. 01/21/16  Yes Lendon Colonel, NP  metoprolol tartrate (LOPRESSOR) 25 MG  tablet Take 1 tablet (25 mg total) by mouth 2 (two) times daily. 07/24/16 10/29/16 Yes Herminio Commons, MD  Multiple Vitamins-Minerals (CENTRUM SILVER PO) Take 1 tablet by mouth daily.     Yes Historical Provider, MD  nystatin cream (MYCOSTATIN) Apply 1 application topically 3 (three) times daily.   Yes Historical Provider, MD  Omega-3 Fatty Acids (FISH OIL) 1200 MG CAPS Take 1,200 mg by mouth daily.   Yes Historical Provider, MD  Probiotic Product ([place]) Take 1 capsule by mouth daily.    Yes Historical Provider, MD  silver sulfADIAZINE (SILVADENE) 1 % cream Apply topically daily. Patient taking differently: Apply 1 application topically daily. Applied to affected area of foot. 11/16/15  Yes Loleta Chance, MD  warfarin (JANTOVEN) 2.5 MG tablet Take as directed by [facility] Patient taking differently: Take 2.5 mg  by mouth daily. 1 tablet (2.5 mg) on Tuesday, Thursday, and Saturday; all other days (Sunday, Monday, Wednesday, and Friday) 0.5 tablet (1.25 mg) 07/20/16  Yes Herminio Commons, MD  HYDROcodone-acetaminophen (NORCO/VICODIN) 5-325 MG tablet Take 1 tablet by mouth every 4 (four) hours as needed. 10/29/16   Evalee Jefferson, PA-C    Family History Family History  Problem Relation Age of Onset  . Heart disease Mother     Social History Social History  Substance Use Topics  . Smoking status: Never Smoker  . Smokeless tobacco: Never Used  . Alcohol use No     Allergies   Lipitor [atorvastatin]; Simvastatin; Penicillins; and Sulfa antibiotics   Review of Systems Review of Systems  Constitutional: Negative for fever.  HENT: Negative for congestion and sore throat.   Eyes: Negative.   Respiratory: Negative for chest tightness and shortness of breath.   Cardiovascular: Negative for chest pain.  Gastrointestinal: Negative for abdominal pain and nausea.  Genitourinary: Positive for dysuria, flank pain and hematuria.  Musculoskeletal: Negative for arthralgias,  joint swelling and neck pain.  Skin: Negative.  Negative for rash and wound.  Neurological: Negative for dizziness, weakness, light-headedness, numbness and headaches.  Psychiatric/Behavioral: Negative.      Physical Exam Updated Vital Signs BP 150/78 (BP Location: Left Arm)   Pulse 80   Temp 97.7 F (36.5 C) (Oral)   Resp 19   Ht 5' 9"  (1.753 m)   Wt 82.1 kg   SpO2 99%   BMI 26.73 kg/m   Physical Exam  Constitutional: He appears well-developed and well-nourished.  HENT:  Head: Normocephalic and atraumatic.  Eyes: Conjunctivae are normal.  Neck: Normal range of motion.  Cardiovascular: Normal rate, regular rhythm, normal heart sounds and intact distal pulses.   Pulmonary/Chest: Effort normal and breath sounds normal. He has no wheezes.  Abdominal: Soft. Bowel sounds are normal. There is no tenderness. There is no CVA tenderness.  Musculoskeletal: Normal range of motion.  Neurological: He is alert.  Skin: Skin is warm and dry.  Psychiatric: He has a normal mood and affect.  Nursing note and vitals reviewed.    ED Treatments / Results  Labs (all labs ordered are listed, but only abnormal results are displayed) Labs Reviewed  URINALYSIS, ROUTINE W REFLEX MICROSCOPIC - Abnormal; Notable for the following:       Result Value   APPearance CLOUDY (*)    Hgb urine dipstick LARGE (*)    Protein, ur 30 (*)    Bacteria, UA FEW (*)    All other components within normal limits  BASIC METABOLIC PANEL - Abnormal; Notable for the following:    Potassium 2.9 (*)    CO2 20 (*)    Glucose, Bld 137 (*)    BUN 26 (*)    Calcium 8.0 (*)    All other components within normal limits  CBC WITH DIFFERENTIAL/PLATELET - Abnormal; Notable for the following:    WBC 13.6 (*)    RBC 4.18 (*)    Neutro Abs 11.1 (*)    All other components within normal limits  PROTIME-INR - Abnormal; Notable for the following:    Prothrombin Time 26.0 (*)    All other components within normal limits     EKG  EKG Interpretation None       Radiology Ct Renal Stone Study  Result Date: 10/29/2016 CLINICAL DATA:  81 year old presenting with left flank pain and inability to void. Patient was diagnosed 2 weeks ago with a  MRSA urinary tract infection. Current history of hypertension and diabetes. Prior left lower extremity amputation. Personal history of head neck cancer in 1993 with resection of the left side of the mandible and cadaver implant. EXAM: CT ABDOMEN AND PELVIS WITHOUT CONTRAST TECHNIQUE: Multidetector CT imaging of the abdomen and pelvis was performed following the standard protocol without IV contrast. COMPARISON:  None. FINDINGS: Lower chest: Respiratory motion blurred many of the images, though the visualized lung bases appear clear. Heart enlarged with left ventricular predominance. Right coronary artery atherosclerosis. Hepatobiliary: Calcified granuloma in the anterior segment right lobe of liver at the dome. No significant focal hepatic parenchymal abnormality, allowing for the unenhanced technique. Upper normal sized gallbladder without opaque gallstones. No biliary ductal dilation. Pancreas: Moderate atrophy without mass or inflammation. Spleen: 2 calcified splenic granulomata. No significant abnormality involving the spleen. Adrenals/Urinary Tract: Normal appearing adrenal glands. Approximate 1.3 cm simple cyst arising from the mid right kidney. Within the limits the unenhanced technique, no solid renal masses. Mild perinephric edema bilaterally. No hydronephrosis. No visible opaque urinary tract calculi. Approximate 6 mm calculus dependently in the urinary bladder, possibly a recently passed calculus as it is near the left ureteral orifice. Stomach/Bowel: Stomach normal in appearance for the degree of distention. Normal-appearing small bowel. Tortuous and elongated sigmoid colon which extends into the left upper quadrant. Moderate colonic stool burden. No focal abnormalities  involving the colon. Normal appearing decompressed appendix in the right upper pelvis. Vascular/Lymphatic: Severe aortoiliofemoral atherosclerosis without aneurysm. No pathologic lymphadenopathy. Reproductive: Mild prostate gland enlargement consistent with patient age. Normal seminal vesicles. Both testicles are superiorly positioned in the inguinal canals. No evidence of inguinal hernia. Other: None. Musculoskeletal: Facet degenerative changes involving the lower lumbar spine. Multilevel degenerative disc disease and spondylosis involving the lower thoracic and lumbar spine. No acute abnormalities. IMPRESSION: 1. 6 mm calculus dependently in the urinary bladder, near the left ureteral orifice. Query recent passage of a left ureteral stone. No obstructing urinary tract calculi currently. 2. No acute abnormalities otherwise involving the abdomen or pelvis. 3. Moderate pancreatic atrophy. 4. Superior positioning of both testicles in the inguinal canals. 5. Severe aortoiliofemoral atherosclerosis without aneurysm. Electronically Signed   By: Evangeline Dakin M.D.   On: 10/29/2016 13:19    Procedures Procedures (including critical care time)  Medications Ordered in ED Medications  ondansetron (ZOFRAN) injection 4 mg (4 mg Intravenous Given 10/29/16 0845)  potassium chloride SA (K-DUR,KLOR-CON) CR tablet 20 mEq (20 mEq Oral Given 10/29/16 0924)     Initial Impression / Assessment and Plan / ED Course  I have reviewed the triage vital signs and the nursing notes.  Pertinent labs & imaging results that were available during my care of the patient were reviewed by me and considered in my medical decision making (see chart for details).  Clinical Course     Hydrocodone prescribed, cautioned re sedation.  Referral to urology.    Pt was seen by Dr. Leonette Monarch prior to dc home.  Final Clinical Impressions(s) / ED Diagnoses   Final diagnoses:  Flank pain  Kidney stone    New Prescriptions New  Prescriptions   HYDROCODONE-ACETAMINOPHEN (NORCO/VICODIN) 5-325 MG TABLET    Take 1 tablet by mouth every 4 (four) hours as needed.     Evalee Jefferson, PA-C 10/29/16 [place], MD 10/29/16 [place], PA-C 11/21/16 [place], MD 11/21/16 [id number]

## 2016-10-29 NOTE — Discharge Instructions (Signed)
You may take the medicine prescribed for your pain if needed.  You do not have a urinary infection today. You do have a kidney stone which has fallen into your bladder.  You should be able to pass this without difficulty, but it would benefit you to follow up with a urologist as recommended by your primary doctor.

## 2016-10-29 NOTE — ED Notes (Signed)
Awaiting ct

## 2016-10-29 NOTE — ED Triage Notes (Signed)
Patient c/o left flank pain that started this morning. Per patient unable to void this morning. Patient seen here in ER 2 weeks ago after similar symptoms accompanied by hematuria. Per patient checked for kidney stones but was told he had MRSA in urine and was given antibiotics. Patient was to follow-up with urologist but was never contacted by them after he called them twice. Patient states pain improved and he felt much better till this morning. Per patient pain radiates into lower abd. Denies any diarrhea or fevers but reports nausea and dry heaving.

## 2016-11-15 ENCOUNTER — Ambulatory Visit (INDEPENDENT_AMBULATORY_CARE_PROVIDER_SITE_OTHER): Payer: Medicare Other | Admitting: *Deleted

## 2016-11-15 DIAGNOSIS — I482 Chronic atrial fibrillation: Secondary | ICD-10-CM

## 2016-11-15 DIAGNOSIS — I4821 Permanent atrial fibrillation: Secondary | ICD-10-CM

## 2016-11-15 DIAGNOSIS — Z5181 Encounter for therapeutic drug level monitoring: Secondary | ICD-10-CM

## 2016-11-15 LAB — POCT INR: INR: 5.9

## 2016-11-16 ENCOUNTER — Ambulatory Visit: Payer: No Typology Code available for payment source | Admitting: Vascular Surgery

## 2016-11-20 ENCOUNTER — Encounter: Payer: Self-pay | Admitting: Vascular Surgery

## 2016-11-22 ENCOUNTER — Encounter: Payer: Self-pay | Admitting: Vascular Surgery

## 2016-11-22 ENCOUNTER — Ambulatory Visit (INDEPENDENT_AMBULATORY_CARE_PROVIDER_SITE_OTHER): Payer: Medicare Other | Admitting: Vascular Surgery

## 2016-11-22 VITALS — BP 142/71 | HR 58 | Temp 97.4°F | Resp 16 | Ht 69.0 in | Wt 181.0 lb

## 2016-11-22 DIAGNOSIS — I739 Peripheral vascular disease, unspecified: Secondary | ICD-10-CM

## 2016-11-22 NOTE — Progress Notes (Signed)
Patient name: [name] MRN: [id number] DOB: 29-Aug-1936 Sex: male  REASON FOR VISIT: Follow up of wound right leg.  HPI: [name] is a 81 y.o. male this patient had developed nonhealing wounds on the right leg. He has an AKA on the left. He underwent an arteriogram on 08/14/2016 which showed that on the right side, there was no significant inflow disease. The anterior tibial artery was occluded but otherwise he had good flow in the right leg and I felt that he had adequate circulation to heal the wounds on his right leg. He was set up for a 3 month follow up visit.  Since I saw him last he has been doing well. The wounds have healed except that he bumped the posterior aspect of his right leg on the running board. However this wound has essentially healed also. In addition he had some work done on the dorsum of his right foot yesterday by his podiatrist.  He denies any rest pain or right lower extremity claudication.   Past Medical History:  Diagnosis Date  . Arteriosclerotic cardiovascular disease (ASCVD)    CABG in 1990s; 2002 total obstruction of LAD, CX and RCA with patent grafts and nl EF; Stress nuc. 2008 - mild LV dilation; normal EF; questionable small anteroapical scar; no ischemia  . Arthritis    "left knee" (11/08/2015)  . Atrial fibrillation ([place])   . Cellulitis 11/08/2015   "both feet"  . Chronic anticoagulation   . Dental crowns present   . Diabetic peripheral neuropathy (HCC)    bilateral lower leg, hands  . History of blood transfusion    "related to my leg surgery?"  . Hypertension    states under control with meds., has been on med. x "long time"  . Iron deficiency anemia   . Jaw cancer ([place]) 1990s   "squamous cell"  . Myocardial infarction 1990s   "after my jaw OR"  . Pedal edema    "not a lot", per pt.  . Peripheral vascular disease ([place])   . Presence of retained hardware 07/2015   failed hardware jaw  . Runny nose 07/27/2015   clear drainage, per  pt.  . Type II diabetes mellitus ([place])    "diet controlled" (11/08/2015)    Family History  Problem Relation Age of Onset  . Heart disease Mother     SOCIAL HISTORY: Social History  Substance Use Topics  . Smoking status: Never Smoker  . Smokeless tobacco: Never Used  . Alcohol use No    Allergies  Allergen Reactions  . Lipitor [Atorvastatin] Other (See Comments)    SEVERE HEADACHE  . Simvastatin Other (See Comments)    MUSCLE ACHES  . Penicillins Rash    Has patient had a PCN reaction causing immediate rash, facial/tongue/throat swelling, SOB or lightheadedness with hypotension: Yes Has patient had a PCN reaction causing severe rash involving mucus membranes or skin necrosis: No Has patient had a PCN reaction that required hospitalization No Has patient had a PCN reaction occurring within the last 10 years: No If all of the above answers are "NO", then may proceed with Cephalosporin use.   . Sulfa Antibiotics Rash    Tolerates silver sulfadiazine cream at home    Current Outpatient Prescriptions  Medication Sig Dispense Refill  . amLODipine (NORVASC) 10 MG tablet Take 1 tablet (10 mg total) by mouth daily. 90 tablet 3  . aspirin EC 81 MG tablet Take 81 mg by mouth daily.    Marland Kitchen  Difluprednate (DUREZOL) 0.05 % EMUL Place 1 drop into both eyes 2 (two) times daily.     Marland Kitchen doxycycline (VIBRAMYCIN) 100 MG capsule Take 1 capsule by mouth 2 (two) times daily.    . feeding supplement (BOOST / RESOURCE BREEZE) LIQD Take 1 Container by mouth 3 (three) times daily between meals. (Patient taking differently: Take 1 Container by mouth 2 (two) times daily. ) 90 Container 4  . ferrous sulfate 325 (65 FE) MG tablet Take 65 mg by mouth daily with breakfast.    . halobetasol (ULTRAVATE) 0.05 % ointment Apply 1 application topically 2 (two) times daily.    Marland Kitchen HYDROcodone-acetaminophen (NORCO/VICODIN) 5-325 MG tablet Take 1 tablet by mouth every 4 (four) hours as needed. 10 tablet 0  .  levofloxacin (LEVAQUIN) 500 MG tablet Take 1 tablet by mouth daily.    Marland Kitchen losartan-hydrochlorothiazide (HYZAAR) 100-25 MG tablet Take 1 tablet by mouth daily. 90 tablet 3  . Multiple Vitamins-Minerals (CENTRUM SILVER PO) Take 1 tablet by mouth daily.      Marland Kitchen nystatin cream (MYCOSTATIN) Apply 1 application topically 3 (three) times daily.    . Omega-3 Fatty Acids (FISH OIL) 1200 MG CAPS Take 1,200 mg by mouth daily.    . Probiotic Product (PHILLIPS COLON HEALTH PO) Take 1 capsule by mouth daily.     . silver sulfADIAZINE (SILVADENE) 1 % cream Apply topically daily. (Patient taking differently: Apply 1 application topically daily. Applied to affected area of foot.) 50 g 0  . warfarin (JANTOVEN) 2.5 MG tablet Take as directed by [facility] (Patient taking differently: Take 2.5 mg by mouth daily. 1 tablet (2.5 mg) on Tuesday, Thursday, and Saturday; all other days (Sunday, Monday, Wednesday, and Friday) 0.5 tablet (1.25 mg)) 90 tablet 1  . metoprolol tartrate (LOPRESSOR) 25 MG tablet Take 1 tablet (25 mg total) by mouth 2 (two) times daily. 180 tablet 3   No current facility-administered medications for this visit.     REVIEW OF SYSTEMS:  [X]  denotes positive finding, [ ]  denotes negative finding Cardiac  Comments:  Chest pain or chest pressure:    Shortness of breath upon exertion:    Short of breath when lying flat:    Irregular heart rhythm:        Vascular    Pain in calf, thigh, or hip brought on by ambulation:    Pain in feet at night that wakes you up from your sleep:     Blood clot in your veins:    Leg swelling:         Pulmonary    Oxygen at home:    Productive cough:     Wheezing:         Neurologic    Sudden weakness in arms or legs:     Sudden numbness in arms or legs:     Sudden onset of difficulty speaking or slurred speech:    Temporary loss of vision in one eye:     Problems with dizziness:         Gastrointestinal    Blood in stool:     Vomited blood:          Genitourinary    Burning when urinating:     Blood in urine:        Psychiatric    Major depression:         Hematologic    Bleeding problems:    Problems with blood clotting too easily:  Skin    Rashes or ulcers:        Constitutional    Fever or chills:      PHYSICAL EXAM: Vitals:   11/22/16 0910  BP: (!) 142/71  Pulse: (!) 58  Resp: 16  Temp: 97.4 F (36.3 C)  TempSrc: Oral  SpO2: 99%  Weight: 181 lb (82.1 kg)  Height: 5' 9"  (1.753 m)    GENERAL: The patient is a well-nourished male, in no acute distress. The vital signs are documented above. CARDIAC: There is a regular rate and rhythm.  VASCULAR: I did not detect carotid bruits. The right foot is warm and well perfused. He does have moderate right lower extremity swelling. PULMONARY: There is good air exchange bilaterally without wheezing or rales. ABDOMEN: Soft and non-tender with normal pitched bowel sounds.  MUSCULOSKELETAL: He has a left AKA. NEUROLOGIC: No focal weakness or paresthesias are detected. SKIN: He does have some erythema in the right leg but no open wounds on the anterior aspect of the leg. There is one small wound on the posterior aspect of the leg where he injured it on the running board. PSYCHIATRIC: The patient has a normal affect.  MEDICAL ISSUES:  PERIPHERAL VASCULAR DISEASE: The wounds on the right leg have improved and I think he has adequate circulation to heal the wound on the posterior aspect of his leg now also on the dorsum of his foot. He does have some leg swelling I have encouraged him to elevate his leg. Fortunately, he is not a smoker. I have ordered follow up ABI of the right lower extremity in 1 year and LC and back that time. He knows to call sooner if he has problems.  Deitra Mayo [facility] [phone]

## 2016-11-27 ENCOUNTER — Ambulatory Visit (INDEPENDENT_AMBULATORY_CARE_PROVIDER_SITE_OTHER): Payer: Medicare Other | Admitting: *Deleted

## 2016-11-27 DIAGNOSIS — I482 Chronic atrial fibrillation: Secondary | ICD-10-CM

## 2016-11-27 DIAGNOSIS — I4821 Permanent atrial fibrillation: Secondary | ICD-10-CM

## 2016-11-27 DIAGNOSIS — Z5181 Encounter for therapeutic drug level monitoring: Secondary | ICD-10-CM | POA: Diagnosis not present

## 2016-11-27 LAB — POCT INR: INR: 1.4

## 2016-11-28 NOTE — Addendum Note (Signed)
Addended by: Lianne Cure A on: 11/28/2016 02:29 PM   Modules accepted: Orders

## 2016-12-11 ENCOUNTER — Ambulatory Visit (INDEPENDENT_AMBULATORY_CARE_PROVIDER_SITE_OTHER): Payer: Medicare Other | Admitting: *Deleted

## 2016-12-11 DIAGNOSIS — Z5181 Encounter for therapeutic drug level monitoring: Secondary | ICD-10-CM | POA: Diagnosis not present

## 2016-12-11 DIAGNOSIS — I482 Chronic atrial fibrillation: Secondary | ICD-10-CM

## 2016-12-11 DIAGNOSIS — I4821 Permanent atrial fibrillation: Secondary | ICD-10-CM

## 2016-12-11 LAB — POCT INR: INR: 1.7

## 2016-12-25 ENCOUNTER — Ambulatory Visit (INDEPENDENT_AMBULATORY_CARE_PROVIDER_SITE_OTHER): Payer: Medicare Other | Admitting: *Deleted

## 2016-12-25 DIAGNOSIS — I4821 Permanent atrial fibrillation: Secondary | ICD-10-CM

## 2016-12-25 DIAGNOSIS — Z5181 Encounter for therapeutic drug level monitoring: Secondary | ICD-10-CM | POA: Diagnosis not present

## 2016-12-25 DIAGNOSIS — I482 Chronic atrial fibrillation: Secondary | ICD-10-CM | POA: Diagnosis not present

## 2016-12-25 LAB — POCT INR: INR: 2.2

## 2017-01-10 ENCOUNTER — Ambulatory Visit (INDEPENDENT_AMBULATORY_CARE_PROVIDER_SITE_OTHER): Payer: Medicare Other | Admitting: Urology

## 2017-01-10 ENCOUNTER — Other Ambulatory Visit (HOSPITAL_COMMUNITY)
Admission: AD | Admit: 2017-01-10 | Discharge: 2017-01-10 | Disposition: A | Discharge status: Home / Self Care | Payer: Medicare Other | Source: Skilled Nursing Facility | Attending: Urology | Admitting: Urology

## 2017-01-10 DIAGNOSIS — R310 Gross hematuria: Secondary | ICD-10-CM | POA: Diagnosis not present

## 2017-01-10 DIAGNOSIS — N200 Calculus of kidney: Secondary | ICD-10-CM | POA: Diagnosis not present

## 2017-01-19 LAB — STONE ANALYSIS: URIC ACID: 100 %

## 2017-01-22 ENCOUNTER — Ambulatory Visit (INDEPENDENT_AMBULATORY_CARE_PROVIDER_SITE_OTHER): Payer: Medicare Other | Admitting: *Deleted

## 2017-01-22 DIAGNOSIS — I482 Chronic atrial fibrillation: Secondary | ICD-10-CM | POA: Diagnosis not present

## 2017-01-22 DIAGNOSIS — Z5181 Encounter for therapeutic drug level monitoring: Secondary | ICD-10-CM | POA: Diagnosis not present

## 2017-01-22 DIAGNOSIS — I4821 Permanent atrial fibrillation: Secondary | ICD-10-CM

## 2017-01-22 LAB — POCT INR: INR: 5.7

## 2017-01-31 ENCOUNTER — Ambulatory Visit (INDEPENDENT_AMBULATORY_CARE_PROVIDER_SITE_OTHER): Payer: Medicare Other | Admitting: *Deleted

## 2017-01-31 DIAGNOSIS — Z5181 Encounter for therapeutic drug level monitoring: Secondary | ICD-10-CM | POA: Diagnosis not present

## 2017-01-31 DIAGNOSIS — I482 Chronic atrial fibrillation: Secondary | ICD-10-CM

## 2017-01-31 DIAGNOSIS — I4821 Permanent atrial fibrillation: Secondary | ICD-10-CM

## 2017-01-31 LAB — POCT INR: INR: 2.7

## 2017-02-21 ENCOUNTER — Ambulatory Visit (INDEPENDENT_AMBULATORY_CARE_PROVIDER_SITE_OTHER): Payer: Medicare Other | Admitting: *Deleted

## 2017-02-21 ENCOUNTER — Ambulatory Visit (INDEPENDENT_AMBULATORY_CARE_PROVIDER_SITE_OTHER): Payer: Medicare Other | Admitting: Urology

## 2017-02-21 DIAGNOSIS — N401 Enlarged prostate with lower urinary tract symptoms: Secondary | ICD-10-CM | POA: Diagnosis not present

## 2017-02-21 DIAGNOSIS — N200 Calculus of kidney: Secondary | ICD-10-CM | POA: Diagnosis not present

## 2017-02-21 DIAGNOSIS — I4821 Permanent atrial fibrillation: Secondary | ICD-10-CM

## 2017-02-21 DIAGNOSIS — Z5181 Encounter for therapeutic drug level monitoring: Secondary | ICD-10-CM

## 2017-02-21 DIAGNOSIS — I482 Chronic atrial fibrillation: Secondary | ICD-10-CM

## 2017-02-21 LAB — POCT INR: INR: 3.6

## 2017-03-06 ENCOUNTER — Encounter: Payer: Self-pay | Admitting: Family

## 2017-03-07 ENCOUNTER — Encounter: Payer: Self-pay | Admitting: Family

## 2017-03-07 ENCOUNTER — Ambulatory Visit (INDEPENDENT_AMBULATORY_CARE_PROVIDER_SITE_OTHER): Payer: Medicare Other | Admitting: Family

## 2017-03-07 VITALS — BP 124/66 | HR 63 | Resp 16 | Ht 68.0 in | Wt 182.0 lb

## 2017-03-07 DIAGNOSIS — I779 Disorder of arteries and arterioles, unspecified: Secondary | ICD-10-CM | POA: Diagnosis not present

## 2017-03-07 DIAGNOSIS — Z89612 Acquired absence of left leg above knee: Secondary | ICD-10-CM

## 2017-03-07 DIAGNOSIS — L03115 Cellulitis of right lower limb: Secondary | ICD-10-CM

## 2017-03-07 NOTE — Progress Notes (Signed)
[facility]   CC: Follow up peripheral artery occlusive disease  History of Present Illness [name] is a 81 y.o. male who had developed nonhealing wounds on the right leg. He has an AKA on the left. He underwent an arteriogram on 08/14/2016 which showed that on the right side, there was no significant inflow disease. The anterior tibial artery was occluded but otherwise he had good flow in the right leg and Dr. Scot Dock felt that he had adequate circulation to heal the wounds on his right leg. He was set up for a 3 month follow up visit.  Dr. Scot Dock last evaluated pt on 11-22-16. At that time nonhealing wounds had developed on the right leg. The wounds on the right leg had improved and Dr. Scot Dock thought he has adequate circulation to heal the wound on the posterior aspect of his leg now also on the dorsum of his foot. He had some leg swelling, Dr. Scot Dock encouraged him to elevate his leg. Fortunately, he is not a smoker. Dr. Scot Dock ordered follow up ABI of the right lower extremity in 1 year and see him and back that time.  He denies any rest pain or right lower extremity claudication, but he apparently does not walk enough to elicit claudication symptoms.   Pt returns today at the request of his PCP, Dr. Karie Kirks, whose note from 03-05-17 indicates he is concerned about pt's persistent cellulitis of right lower leg. Dr. Karie Kirks prescribed clindamycin 300 mg po qid and recheck him in 7-10 days.   Pt admits to never elevating his leg during the day, that his right leg remains dependent all day while he is sitting in his w/c, but that by morning (overnight) the swelling in his right lower leg has mostly resolved.   Pt states his podiatrist told him to stay off his right foot while the callus that is being worked on improves. Therefore he has not used his left AKA prosthesis.  Pt denies any problems with his left AKA stump.   Pt Diabetic: Yes, 5.6 in October  2017 Pt smoker: non-smoker  Pt meds include: Statin :No, intolerant of statins ASA: Yes Other anticoagulants/antiplatelets: warfarin, history of atrial fib  Past Medical History:  Diagnosis Date  . Arteriosclerotic cardiovascular disease (ASCVD)    CABG in 1990s; 2002 total obstruction of LAD, CX and RCA with patent grafts and nl EF; Stress nuc. 2008 - mild LV dilation; normal EF; questionable small anteroapical scar; no ischemia  . Arthritis    "left knee" (11/08/2015)  . Atrial fibrillation ([place])   . Cellulitis 11/08/2015   "both feet"  . Chronic anticoagulation   . Dental crowns present   . Diabetic peripheral neuropathy (HCC)    bilateral lower leg, hands  . History of blood transfusion    "related to my leg surgery?"  . Hypertension    states under control with meds., has been on med. x "long time"  . Iron deficiency anemia   . Jaw cancer ([place]) 1990s   "squamous cell"  . Myocardial infarction ([place]) 1990s   "after my jaw OR"  . Pedal edema    "not a lot", per pt.  . Peripheral vascular disease ([place])   . Presence of retained hardware 07/2015   failed hardware jaw  . Runny nose 07/27/2015   clear drainage, per pt.  . Type II diabetes mellitus ([place])    "diet controlled" (11/08/2015)    Social History Social History  Substance  Use Topics  . Smoking status: Never Smoker  . Smokeless tobacco: Never Used  . Alcohol use No    Family History Family History  Problem Relation Age of Onset  . Heart disease Mother     Past Surgical History:  Procedure Laterality Date  . AMPUTATION Left 11/11/2015   Procedure: LEFT ABOVE KNEE AMPUTATION;  Surgeon: Angelia Mould, MD;  Location: [facility];  Service: Vascular;  Laterality: Left;  . CARDIAC CATHETERIZATION  1990s; 04/16/2001  . CATARACT EXTRACTION W/ INTRAOCULAR LENS  IMPLANT, BILATERAL Bilateral 09/2015  . COLONOSCOPY N/A 11/27/2014   Procedure: COLONOSCOPY;  Surgeon: Rogene Houston, MD;  [facility];   Service: Endoscopy;  Laterality: N/A;  830  . CORONARY ARTERY BYPASS GRAFT  1990's   "CABG X4"  . FRACTURE SURGERY    . I&D EXTREMITY Left 11/10/2015   Procedure: INCISION AND DRAINAGE LEFT FOOT, AMPUTATION OF LEFT THIRD TOE;  Surgeon: Conrad White Bird, MD;  Location: [facility];  Service: Vascular;  Laterality: Left;  . INCISION AND DRAINAGE ABSCESS Left 06/16/2005   wide exc. abscess 5th toe  . MANDIBULAR HARDWARE REMOVAL Left 08/02/2015   Procedure:  HARDWARE REMOVAL TWO MANDIBULAR SCREWS;  Surgeon: Jannette Fogo, DDS;  Location: [facility];  Service: Oral Surgery;  Laterality: Left;  . ORIF TIBIA FRACTURE Left ~ 1970  . PERIPHERAL VASCULAR CATHETERIZATION N/A 08/14/2016   Procedure: Abdominal Aortogram w/Lower Extremity;  Surgeon: Angelia Mould, MD;  Location: [facility];  Service: Cardiovascular;  Laterality: N/A;  . SQUAMOUS CELL CARCINOMA EXCISION Left 1993   "took my jaw out; got cadavar in there now"  . TONSILLECTOMY      Allergies  Allergen Reactions  . Lipitor [Atorvastatin] Other (See Comments)    SEVERE HEADACHE  . Simvastatin Other (See Comments)    MUSCLE ACHES  . Penicillins Rash    Has patient had a PCN reaction causing immediate rash, facial/tongue/throat swelling, SOB or lightheadedness with hypotension: Yes Has patient had a PCN reaction causing severe rash involving mucus membranes or skin necrosis: No Has patient had a PCN reaction that required hospitalization No Has patient had a PCN reaction occurring within the last 10 years: No If all of the above answers are "NO", then may proceed with Cephalosporin use.   . Sulfa Antibiotics Rash    Tolerates silver sulfadiazine cream at home    Current Outpatient Prescriptions  Medication Sig Dispense Refill  . amLODipine (NORVASC) 10 MG tablet Take 1 tablet (10 mg total) by mouth daily. 90 tablet 3  . aspirin EC 81 MG tablet Take 81 mg by mouth daily.    Marland Kitchen BYSTOLIC 5 MG tablet     . clindamycin  (CLEOCIN) 300 MG capsule     . Difluprednate (DUREZOL) 0.05 % EMUL Place 1 drop into both eyes 2 (two) times daily.     . feeding supplement (BOOST / RESOURCE BREEZE) LIQD Take 1 Container by mouth 3 (three) times daily between meals. (Patient taking differently: Take 1 Container by mouth 2 (two) times daily. ) 90 Container 4  . ferrous sulfate 325 (65 FE) MG tablet Take 65 mg by mouth daily with breakfast.    . losartan-hydrochlorothiazide (HYZAAR) 100-25 MG tablet Take 1 tablet by mouth daily. 90 tablet 3  . Multiple Vitamins-Minerals (CENTRUM SILVER PO) Take 1 tablet by mouth daily.      Marland Kitchen nystatin cream (MYCOSTATIN) Apply 1 application topically 3 (three) times daily.    Marland Kitchen  Omega-3 Fatty Acids (FISH OIL) 1200 MG CAPS Take 1,200 mg by mouth daily.    . Probiotic Product (PHILLIPS COLON HEALTH PO) Take 1 capsule by mouth daily.     . silver sulfADIAZINE (SILVADENE) 1 % cream Apply topically daily. (Patient taking differently: Apply 1 application topically daily. Applied to affected area of foot.) 50 g 0  . tamsulosin (FLOMAX) 0.4 MG CAPS capsule Take 0.4 mg by mouth daily after supper.    . warfarin (JANTOVEN) 2.5 MG tablet Take as directed by [facility] (Patient taking differently: Take 2.5 mg by mouth daily. 1 tablet (2.5 mg) on Tuesday, Thursday, and Saturday; all other days (Sunday, Monday, Wednesday, and Friday) 0.5 tablet (1.25 mg)) 90 tablet 1   No current facility-administered medications for this visit.     ROS: See HPI for pertinent positives and negatives.   Physical Examination  Vitals:   03/07/17 1414  BP: 124/66  Pulse: 63  Resp: 16  SpO2: 98%  Weight: 182 lb (82.6 kg)  Height: 5' 8"  (1.727 m)   Body mass index is 27.67 kg/m.  General: A&O x 3, WDWN male seated in his w/c. Gait: seated in his w/c  Eyes: PERRLA. Pulmonary: Respirations are non labored, CTAB, good air movement Cardiac: Irregular rhythm with controlled rate, no detected murmur.         Carotid  Bruits Right Left   Negative Negative  Aorta is not palpable. Radial pulses: palpable                           VASCULAR EXAM: Extremities without ischemic changes, without Gangrene; + erythema and cellulitis right lower leg, with shallow fissure at posterior aspect of calf, 1+ non pitting edema of lower leg. Dressing covering callus at dorsum of left foot treated by pt's podiatrist.                                                                                                           LE Pulses Right Left       FEMORAL  not palpable (seated in w/c)  not palpable (seated in w/c)        POPLITEAL  not palpable   AKA        POSTERIOR TIBIAL not palpable   AKA        DORSALIS PEDIS      ANTERIOR TIBIAL not palpable, + Doppler signal  AKA    Abdomen: soft, NT, no palpable masses. Skin: See Extremities. Musculoskeletal: no muscle wasting or atrophy.  Neurologic: A&O X 3; Appropriate Affect ; SENSATION: normal; MOTOR FUNCTION:  moving all extremities equally, motor strength 5/5 throughout. Speech is fluent/normal. CN 2-12 intact.    ASSESSMENT: TAMARCUS CONDIE is a 81 y.o. male who had developed nonhealing wounds on the right leg. He has an AKA on the left. He underwent an arteriogram on 08/14/2016 which showed that on the right side, there was no significant inflow disease. The anterior tibial artery was occluded but otherwise he had  good flow in the right leg and Dr. Scot Dock felt that he had adequate circulation to heal the wounds on his right leg.  His PCP referred him here after he saw pt in his office yesterday, for recurrent cellulitis of right lower leg. The pt admits to leaving his right leg dependant all day while seated in his w/c, states he never elevated his right leg except overnight, and he notices that by morning the swelling in his right lower leg has subsided.  The dependent position of his right lower leg all day and the secondary edema is what is likely facilitating the  recurrent cellulitis of his right lower leg.  Dr. Scot Dock spoke with pt and wife and examined pt.   There is a Doppler signal at the dorsalis pedis.  His wife and pt state that the lower leg seems improved with an emollient they are using.   Pt and wife were given specific information re how to elevate his leg: foot above slightly flexed knee, foot above heart, overnight and 4x/day for 20 minutes.  Exercise leg several times/day.  He is taking clindamycin prescribed by his PCP.     PLAN:  Based on the patient's vascular studies and examination, pt will return to clinic in January 2019 as already scheduled with right leg ABI. I advised pt and wife to notify us if they develop concerns re the circulation in his foot or leg.   I discussed in depth with the patient the nature of atherosclerosis, and emphasized the importance of maximal medical management including strict control of blood pressure, blood glucose, and lipid levels, obtaining regular exercise, and continued cessation of smoking.  The patient is aware that without maximal medical management the underlying atherosclerotic disease process will progress, limiting the benefit of any interventions.  The patient was given information about PAD including signs, symptoms, treatment, what symptoms should prompt the patient to seek immediate medical care, and risk reduction measures to take.  Clemon Chambers, RN, MSN, FNP-C [facility] Phone: [phone]  Clinic MD: Scot Dock  03/07/17 2:26 PM

## 2017-03-07 NOTE — Patient Instructions (Signed)
Cellulitis, Adult Cellulitis is a skin infection. The infected area is usually red and sore. This condition occurs most often in the arms and lower legs. It is very important to get treated for this condition. Follow these instructions at home:  Take over-the-counter and prescription medicines only as told by your doctor.  If you were prescribed an antibiotic medicine, take it as told by your doctor. Do not stop taking the antibiotic even if you start to feel better.  Drink enough fluid to keep your pee (urine) clear or pale yellow.  Do not touch or rub the infected area.  Raise (elevate) the infected area above the level of your heart while you are sitting or lying down.  Place warm or cold wet cloths (warm or cold compresses) on the infected area. Do this as told by your doctor.  Keep all follow-up visits as told by your doctor. This is important. These visits let your doctor make sure your infection is not getting worse. Contact a doctor if:  You have a fever.  Your symptoms do not get better after 1-2 days of treatment.  Your bone or joint under the infected area starts to hurt after the skin has healed.  Your infection comes back. This can happen in the same area or another area.  You have a swollen bump in the infected area.  You have new symptoms.  You feel ill and also have muscle aches and pains. Get help right away if:  Your symptoms get worse.  You feel very sleepy.  You throw up (vomit) or have watery poop (diarrhea) for a long time.  There are red streaks coming from the infected area.  Your red area gets larger.  Your red area turns darker. This information is not intended to replace advice given to you by your health care provider. Make sure you discuss any questions you have with your health care provider. Document Released: 03/27/2008 Document Revised: 03/16/2016 Document Reviewed: 08/18/2015 Elsevier Interactive Patient Education  2017 [place].     Peripheral Vascular Disease Peripheral vascular disease (PVD) is a disease of the blood vessels that are not part of your heart and brain. A simple term for PVD is poor circulation. In most cases, PVD narrows the blood vessels that carry blood from your heart to the rest of your body. This can result in a decreased supply of blood to your arms, legs, and internal organs, like your stomach or kidneys. However, it most often affects a person's lower legs and feet. There are two types of PVD.  Organic PVD. This is the more common type. It is caused by damage to the structure of blood vessels.  Functional PVD. This is caused by conditions that make blood vessels contract and tighten (spasm). Without treatment, PVD tends to get worse over time. PVD can also lead to acute ischemic limb. This is when an arm or limb suddenly has trouble getting enough blood. This is a medical emergency. Follow these instructions at home:  Take medicines only as told by your doctor.  Do not use any tobacco products, including cigarettes, chewing tobacco, or electronic cigarettes. If you need help quitting, ask your doctor.  Lose weight if you are overweight, and maintain a healthy weight as told by your doctor.  Eat a diet that is low in fat and cholesterol. If you need help, ask your doctor.  Exercise regularly. Ask your doctor for some good activities for you.  Take good care of your  feet.  Wear comfortable shoes that fit well.  Check your feet often for any cuts or sores. Contact a doctor if:  You have cramps in your legs while walking.  You have leg pain when you are at rest.  You have coldness in a leg or foot.  Your skin changes.  You are unable to get or have an erection (erectile dysfunction).  You have cuts or sores on your feet that are not healing. Get help right away if:  Your arm or leg turns cold and blue.  Your arms or legs become red, warm, swollen, painful, or  numb.  You have chest pain or trouble breathing.  You suddenly have weakness in your face, arm, or leg.  You become very confused or you cannot speak.  You suddenly have a very bad headache.  You suddenly cannot see. This information is not intended to replace advice given to you by your health care provider. Make sure you discuss any questions you have with your health care provider. Document Released: 01/03/2010 Document Revised: 03/16/2016 Document Reviewed: 03/19/2014 Elsevier Interactive Patient Education  2017 [employer].

## 2017-03-14 ENCOUNTER — Telehealth: Payer: Self-pay | Admitting: *Deleted

## 2017-03-14 ENCOUNTER — Ambulatory Visit (INDEPENDENT_AMBULATORY_CARE_PROVIDER_SITE_OTHER): Payer: Medicare Other | Admitting: *Deleted

## 2017-03-14 DIAGNOSIS — I779 Disorder of arteries and arterioles, unspecified: Secondary | ICD-10-CM | POA: Diagnosis not present

## 2017-03-14 DIAGNOSIS — I482 Chronic atrial fibrillation: Secondary | ICD-10-CM

## 2017-03-14 DIAGNOSIS — Z5181 Encounter for therapeutic drug level monitoring: Secondary | ICD-10-CM | POA: Diagnosis not present

## 2017-03-14 DIAGNOSIS — I4821 Permanent atrial fibrillation: Secondary | ICD-10-CM

## 2017-03-14 LAB — POCT INR: INR: 7.7

## 2017-03-14 NOTE — Telephone Encounter (Signed)
Patient is taking Clindamycin 371m QID / tg

## 2017-03-14 NOTE — Telephone Encounter (Signed)
Noted in chart.  Effects on coumadin have already been addressed with patient.

## 2017-03-21 ENCOUNTER — Ambulatory Visit (INDEPENDENT_AMBULATORY_CARE_PROVIDER_SITE_OTHER): Payer: Medicare Other | Admitting: *Deleted

## 2017-03-21 DIAGNOSIS — Z5181 Encounter for therapeutic drug level monitoring: Secondary | ICD-10-CM | POA: Diagnosis not present

## 2017-03-21 DIAGNOSIS — I4821 Permanent atrial fibrillation: Secondary | ICD-10-CM

## 2017-03-21 DIAGNOSIS — I482 Chronic atrial fibrillation: Secondary | ICD-10-CM

## 2017-03-21 DIAGNOSIS — I779 Disorder of arteries and arterioles, unspecified: Secondary | ICD-10-CM | POA: Diagnosis not present

## 2017-03-21 LAB — POCT INR: INR: 1.6

## 2017-03-26 ENCOUNTER — Ambulatory Visit (INDEPENDENT_AMBULATORY_CARE_PROVIDER_SITE_OTHER): Payer: Medicare Other | Admitting: *Deleted

## 2017-03-26 DIAGNOSIS — Z5181 Encounter for therapeutic drug level monitoring: Secondary | ICD-10-CM | POA: Diagnosis not present

## 2017-03-26 DIAGNOSIS — I779 Disorder of arteries and arterioles, unspecified: Secondary | ICD-10-CM

## 2017-03-26 DIAGNOSIS — I482 Chronic atrial fibrillation: Secondary | ICD-10-CM

## 2017-03-26 DIAGNOSIS — I4821 Permanent atrial fibrillation: Secondary | ICD-10-CM

## 2017-03-26 LAB — POCT INR: INR: 4.4

## 2017-04-02 ENCOUNTER — Other Ambulatory Visit: Payer: Self-pay

## 2017-04-02 MED ORDER — AMLODIPINE BESYLATE 10 MG PO TABS: 10.0000 mg | ORAL_TABLET | Freq: Every day | ORAL | 3 refills | Status: DC

## 2017-04-02 NOTE — Telephone Encounter (Signed)
escribed fax request for norvasc 10 mg #90 to [facility]

## 2017-04-04 ENCOUNTER — Ambulatory Visit (INDEPENDENT_AMBULATORY_CARE_PROVIDER_SITE_OTHER): Payer: Medicare Other | Admitting: *Deleted

## 2017-04-04 DIAGNOSIS — Z5181 Encounter for therapeutic drug level monitoring: Secondary | ICD-10-CM

## 2017-04-04 DIAGNOSIS — I4821 Permanent atrial fibrillation: Secondary | ICD-10-CM

## 2017-04-04 DIAGNOSIS — I482 Chronic atrial fibrillation: Secondary | ICD-10-CM | POA: Diagnosis not present

## 2017-04-04 DIAGNOSIS — I779 Disorder of arteries and arterioles, unspecified: Secondary | ICD-10-CM | POA: Diagnosis not present

## 2017-04-04 LAB — POCT INR: INR: 3.6

## 2017-04-11 ENCOUNTER — Encounter (HOSPITAL_COMMUNITY): Payer: PRIVATE HEALTH INSURANCE

## 2017-04-11 ENCOUNTER — Ambulatory Visit: Payer: PRIVATE HEALTH INSURANCE | Admitting: Vascular Surgery

## 2017-04-16 ENCOUNTER — Telehealth: Payer: Self-pay | Admitting: *Deleted

## 2017-04-16 ENCOUNTER — Ambulatory Visit (INDEPENDENT_AMBULATORY_CARE_PROVIDER_SITE_OTHER): Payer: Medicare Other | Admitting: *Deleted

## 2017-04-16 ENCOUNTER — Other Ambulatory Visit (HOSPITAL_COMMUNITY)
Admission: RE | Admit: 2017-04-16 | Discharge: 2017-04-16 | Disposition: A | Discharge status: Home / Self Care | Payer: Medicare Other | Source: Ambulatory Visit | Attending: Cardiovascular Disease | Admitting: Cardiovascular Disease

## 2017-04-16 DIAGNOSIS — I779 Disorder of arteries and arterioles, unspecified: Secondary | ICD-10-CM

## 2017-04-16 DIAGNOSIS — Z5181 Encounter for therapeutic drug level monitoring: Secondary | ICD-10-CM

## 2017-04-16 DIAGNOSIS — I4821 Permanent atrial fibrillation: Secondary | ICD-10-CM

## 2017-04-16 DIAGNOSIS — I482 Chronic atrial fibrillation: Secondary | ICD-10-CM | POA: Diagnosis not present

## 2017-04-16 LAB — POCT INR

## 2017-04-16 LAB — PROTIME-INR
INR: 6.65 — AB
PROTHROMBIN TIME: 60.1 s — AB (ref 11.4–15.2)

## 2017-04-16 NOTE — Telephone Encounter (Signed)
Dr. Karie Kirks is requesting that we see this patient again for RLE cellulitis and edema. The clindamycin that Dr. Karie Kirks had the patient on reacted with his Jantoven (warfarin); pt's INR was 6.6 today per Dr. Kristian Covey lab) and so the patient is not on any antibiotic at this time.   Dr. Karie Kirks is not aware if patient has been elevating his leg or doing his leg exercises as discussed with Dr. Scot Dock and Vinnie Level on 03-07-17. I told Dr. Karie Kirks that I would make him an appt to see the NP this Wednesday. Patient has not been referred to any Wound care center or Dermatologist for his cellulitis.

## 2017-04-17 NOTE — Telephone Encounter (Signed)
Sched appt 04/18/17 at 10:15. Spoke to pt to confirm.

## 2017-04-18 ENCOUNTER — Ambulatory Visit (INDEPENDENT_AMBULATORY_CARE_PROVIDER_SITE_OTHER): Payer: Medicare Other | Admitting: Family

## 2017-04-18 ENCOUNTER — Ambulatory Visit: Payer: PRIVATE HEALTH INSURANCE | Admitting: Vascular Surgery

## 2017-04-18 ENCOUNTER — Encounter: Payer: Self-pay | Admitting: Family

## 2017-04-18 ENCOUNTER — Encounter (HOSPITAL_COMMUNITY): Payer: PRIVATE HEALTH INSURANCE

## 2017-04-18 VITALS — BP 138/69 | HR 79 | Temp 97.7°F | Resp 18 | Ht 68.5 in | Wt 174.0 lb

## 2017-04-18 DIAGNOSIS — Z89612 Acquired absence of left leg above knee: Secondary | ICD-10-CM

## 2017-04-18 DIAGNOSIS — I779 Disorder of arteries and arterioles, unspecified: Secondary | ICD-10-CM | POA: Diagnosis not present

## 2017-04-18 DIAGNOSIS — L03115 Cellulitis of right lower limb: Secondary | ICD-10-CM

## 2017-04-18 NOTE — Progress Notes (Signed)
[facility] [place]   CC: Follow up peripheral artery occlusive disease  History of Present Illness [name] is a 81 y.o. male who had developed nonhealing wounds on the right leg. He has an AKA on the left. He underwent an arteriogram on 08/14/2016 which showed that on the right side, there was no significant inflow disease. The anterior tibial artery was occluded but otherwise he had good flow in the right leg and Dr. Scot Dock felt that he had adequate circulation to heal the wounds on his right leg. He was set up for a 3 month follow up visit.  He returns today, pt states at his PCP's request, to see if he can stop the clindamycin he has been taking since 03-06-17 to address the cellulitis in his right lower leg.  He also returned on 03-06-17 at the request of his PCP, Dr. Karie Kirks, whose note from 03-05-17 indicates he is concerned about pt's persistent cellulitis of right lower leg. Pt states a great concern is that the clindamycin interferes with his INR, increases too high , as high as 6.6, on clindamycin; he also has diarrhea from clindamycin.  Dr. Karie Kirks prescribed clindamycin 300 mg po qid on 03-06-17 and recheck him in 7-10 days.  He is using the Lounge Doctor pillow overnight and 4x/day for 20 minutes as directed and states it feels good and works well.   He states that his right leg feels stronger, denies pain in his right leg. He thinks the redness has improved, denies any open wounds.  He denies fever or chills.   Dr. Scot Dock last evaluated pt on 11-22-16. At that time nonhealing wounds had developed on the right leg. The wounds on the right leg had improved and Dr. Scot Dock thought he has adequate circulation to heal the wound on theposterior aspect of his leg now also on the dorsum of his foot. He had some leg swelling, Dr. Scot Dock encouraged him to elevate his leg. Fortunately, he is not a smoker. Dr. Scot Dock ordered follow up ABI of the right lower  extremity in 1 year and see him and back that time.  He denies any rest pain or right lower extremity claudication, but he apparently does not walk enough to elicit claudication symptoms.    Pt states his podiatrist told him to stay off his right foot while the callus that is being worked on improves. Therefore he has not used his left AKA prosthesis.  Pt denies any problems with his left AKA stump.   Pt Diabetic: Yes, 5.6 in October 2017 Pt smoker: non-smoker  Pt meds include: Statin :No, intolerant of statins ASA: Yes Other anticoagulants/antiplatelets: warfarin, history of atrial fib   Past Medical History:  Diagnosis Date  . Arteriosclerotic cardiovascular disease (ASCVD)    CABG in 1990s; 2002 total obstruction of LAD, CX and RCA with patent grafts and nl EF; Stress nuc. 2008 - mild LV dilation; normal EF; questionable small anteroapical scar; no ischemia  . Arthritis    "left knee" (11/08/2015)  . Atrial fibrillation ([place])   . Cellulitis 11/08/2015   "both feet"  . Chronic anticoagulation   . Dental crowns present   . Diabetic peripheral neuropathy (HCC)    bilateral lower leg, hands  . History of blood transfusion    "related to my leg surgery?"  . Hypertension    states under control with meds., has been on med. x "long time"  . Iron deficiency anemia   . Jaw cancer (  [place]) 1990s   "squamous cell"  . Myocardial infarction ([place]) 1990s   "after my jaw OR"  . Pedal edema    "not a lot", per pt.  . Peripheral vascular disease ([place])   . Presence of retained hardware 07/2015   failed hardware jaw  . Runny nose 07/27/2015   clear drainage, per pt.  . Type II diabetes mellitus ([place])    "diet controlled" (11/08/2015)    Social History Social History  Substance Use Topics  . Smoking status: Never Smoker  . Smokeless tobacco: Never Used  . Alcohol use No    Family History Family History  Problem Relation Age of Onset  . Heart disease Mother     Past  Surgical History:  Procedure Laterality Date  . AMPUTATION Left 11/11/2015   Procedure: LEFT ABOVE KNEE AMPUTATION;  Surgeon: Angelia Mould, MD;  Location: [facility];  Service: Vascular;  Laterality: Left;  . CARDIAC CATHETERIZATION  1990s; 04/16/2001  . CATARACT EXTRACTION W/ INTRAOCULAR LENS  IMPLANT, BILATERAL Bilateral 09/2015  . COLONOSCOPY N/A 11/27/2014   Procedure: COLONOSCOPY;  Surgeon: Rogene Houston, MD;  [facility];  Service: Endoscopy;  Laterality: N/A;  830  . CORONARY ARTERY BYPASS GRAFT  1990's   "CABG X4"  . FRACTURE SURGERY    . I&D EXTREMITY Left 11/10/2015   Procedure: INCISION AND DRAINAGE LEFT FOOT, AMPUTATION OF LEFT THIRD TOE;  Surgeon: Conrad Manila, MD;  Location: [facility];  Service: Vascular;  Laterality: Left;  . INCISION AND DRAINAGE ABSCESS Left 06/16/2005   wide exc. abscess 5th toe  . MANDIBULAR HARDWARE REMOVAL Left 08/02/2015   Procedure:  HARDWARE REMOVAL TWO MANDIBULAR SCREWS;  Surgeon: Jannette Fogo, DDS;  Location: [facility];  Service: Oral Surgery;  Laterality: Left;  . ORIF TIBIA FRACTURE Left ~ 1970  . PERIPHERAL VASCULAR CATHETERIZATION N/A 08/14/2016   Procedure: Abdominal Aortogram w/Lower Extremity;  Surgeon: Angelia Mould, MD;  Location: [facility];  Service: Cardiovascular;  Laterality: N/A;  . SQUAMOUS CELL CARCINOMA EXCISION Left 1993   "took my jaw out; got cadavar in there now"  . TONSILLECTOMY      Allergies  Allergen Reactions  . Lipitor [Atorvastatin] Other (See Comments)    SEVERE HEADACHE  . Simvastatin Other (See Comments)    MUSCLE ACHES  . Penicillins Rash    Has patient had a PCN reaction causing immediate rash, facial/tongue/throat swelling, SOB or lightheadedness with hypotension: Yes Has patient had a PCN reaction causing severe rash involving mucus membranes or skin necrosis: No Has patient had a PCN reaction that required hospitalization No Has patient had a PCN reaction occurring  within the last 10 years: No If all of the above answers are "NO", then may proceed with Cephalosporin use.   . Sulfa Antibiotics Rash    Tolerates silver sulfadiazine cream at home    Current Outpatient Prescriptions  Medication Sig Dispense Refill  . amLODipine (NORVASC) 10 MG tablet Take 1 tablet (10 mg total) by mouth daily. 90 tablet 3  . aspirin EC 81 MG tablet Take 81 mg by mouth daily.    Marland Kitchen BYSTOLIC 5 MG tablet     . clindamycin (CLEOCIN) 300 MG capsule     . Difluprednate (DUREZOL) 0.05 % EMUL Place 1 drop into both eyes 2 (two) times daily.     . feeding supplement (BOOST / RESOURCE BREEZE) LIQD Take 1 Container by mouth 3 (three) times daily between meals. (Patient  taking differently: Take 1 Container by mouth 2 (two) times daily. ) 90 Container 4  . ferrous sulfate 325 (65 FE) MG tablet Take 65 mg by mouth daily with breakfast.    . losartan-hydrochlorothiazide (HYZAAR) 100-25 MG tablet Take 1 tablet by mouth daily. 90 tablet 3  . Multiple Vitamins-Minerals (CENTRUM SILVER PO) Take 1 tablet by mouth daily.      Marland Kitchen nystatin cream (MYCOSTATIN) Apply 1 application topically 3 (three) times daily.    . Omega-3 Fatty Acids (FISH OIL) 1200 MG CAPS Take 1,200 mg by mouth daily.    . Probiotic Product (PHILLIPS COLON HEALTH PO) Take 1 capsule by mouth daily.     . silver sulfADIAZINE (SILVADENE) 1 % cream Apply topically daily. (Patient taking differently: Apply 1 application topically daily. Applied to affected area of foot.) 50 g 0  . warfarin (JANTOVEN) 2.5 MG tablet Take as directed by [facility] (Patient taking differently: Take 2.5 mg by mouth daily. 1 tablet (2.5 mg) on Tuesday, Thursday, and Saturday; all other days (Sunday, Monday, Wednesday, and Friday) 0.5 tablet (1.25 mg)) 90 tablet 1  . tamsulosin (FLOMAX) 0.4 MG CAPS capsule      No current facility-administered medications for this visit.     ROS: See HPI for pertinent positives and negatives.   Physical  Examination  Vitals:   04/18/17 0957  BP: 138/69  Pulse: 79  Resp: 18  Temp: 97.7 F (36.5 C)  SpO2: 98%  Weight: 174 lb (78.9 kg)  Height: 5' 8.5" (1.74 m)   Body mass index is 26.07 kg/m.  General: A&O x 3, WDWN male accompanied by his nephew or son.  Gait: seated in his w/c  Eyes: PERRLA. Pulmonary: Respirations are non labored, CTAB, good air movement Cardiac: Irregular rhythm with controlled rate, no detected murmur.         Carotid Bruits Right Left   Negative Negative  Aorta is not palpable. Radial pulses: palpable                           VASCULAR EXAM: Extremities without ischemic changes, without Gangrene; + erythema and cellulitis right lower leg, with no open wounds or ulcers, no edema of lower leg. Callus at dorsum of left foot treated by pt's podiatrist.                                                                                                                                                        LE Pulses Right Left       FEMORAL  not palpable (seated in w/c)  not palpable (seated in w/c)        POPLITEAL  not palpable   AKA        POSTERIOR TIBIAL Not palpable, + Doppler signal  AKA        DORSALIS PEDIS      ANTERIOR TIBIAL not palpable, + Doppler signal  AKA    Abdomen: soft, NT, no palpable masses. Skin: See Extremities. Musculoskeletal: no muscle wasting or atrophy.         Neurologic: A&O X 3; Appropriate Affect ; SENSATION: normal; MOTOR FUNCTION:  moving all extremities equally, motor strength 5/5 throughout. Speech is fluent/normal. CN 2-12 intact.    ASSESSMENT: KINSLER SOEDER is a 81 y.o. male who had developed nonhealing wounds on the right leg. He has an AKA on the left. He underwent an arteriogram on 08/14/2016 which showed that on the right side, there was no significant inflow disease. The anterior tibial artery was occluded but otherwise he had good flow in the right leg and Dr. Scot Dock felt that he had adequate  circulation to heal the wounds on his right leg.   His PCP referred him here in May of 2018 after he saw pt in his office the day prior, for recurrent cellulitis of right lower leg, and again today, asking if clindamycin may be stopped due to causing INR to be too elevated and pt having diarrhea.   Pt has been elevating his leg since his visit a months ago and the mild edema in his right lower leg has resolved, the erythema at mid anterior tibial area has decreased.   He is taking a second round of clindamycin prescribed by his PCP.  Pt states he probably took doxycycline for this cellulitis recently also.   I discussed with Dr. Scot Dock that pt thinks his cellulitis in his right lower leg is improving, but still present, no open wounds are present. Dr. Scot Dock thinks if pt continues to have cellulitis that he should continue on an antibiotic to address this, to be managed by his PCP. Consider pharmacist consult. Pharmacists at [facility] are consulted on matters such as this on a daily basis.     PLAN:  Based on the patient's vascular studies and examination, pt will return to clinic in Februay 2019 as already scheduled with right leg ABI. I advised pt to notify us if he develop concerns re the circulation in his foot or leg.    I discussed in depth with the patient the nature of atherosclerosis, and emphasized the importance of maximal medical management including strict control of blood pressure, blood glucose, and lipid levels, obtaining regular exercise, and continued cessation of smoking.  The patient is aware that without maximal medical management the underlying atherosclerotic disease process will progress, limiting the benefit of any interventions.  The patient was given information about PAD including signs, symptoms, treatment, what symptoms should prompt the patient to seek immediate medical care, and risk reduction measures to take.  Clemon Chambers, RN, MSN,  FNP-C [facility] Phone: [phone]  Clinic MD: Scot Dock  04/18/17 10:17 AM

## 2017-04-23 ENCOUNTER — Ambulatory Visit (INDEPENDENT_AMBULATORY_CARE_PROVIDER_SITE_OTHER): Payer: Medicare Other | Admitting: *Deleted

## 2017-04-23 DIAGNOSIS — I4821 Permanent atrial fibrillation: Secondary | ICD-10-CM

## 2017-04-23 DIAGNOSIS — I482 Chronic atrial fibrillation: Secondary | ICD-10-CM | POA: Diagnosis not present

## 2017-04-23 DIAGNOSIS — I779 Disorder of arteries and arterioles, unspecified: Secondary | ICD-10-CM

## 2017-04-23 DIAGNOSIS — Z5181 Encounter for therapeutic drug level monitoring: Secondary | ICD-10-CM

## 2017-04-23 LAB — POCT INR: INR: 2.7

## 2017-04-30 ENCOUNTER — Ambulatory Visit (INDEPENDENT_AMBULATORY_CARE_PROVIDER_SITE_OTHER): Payer: Medicare Other | Admitting: *Deleted

## 2017-04-30 DIAGNOSIS — I482 Chronic atrial fibrillation: Secondary | ICD-10-CM | POA: Diagnosis not present

## 2017-04-30 DIAGNOSIS — Z5181 Encounter for therapeutic drug level monitoring: Secondary | ICD-10-CM

## 2017-04-30 DIAGNOSIS — I4821 Permanent atrial fibrillation: Secondary | ICD-10-CM

## 2017-04-30 DIAGNOSIS — I779 Disorder of arteries and arterioles, unspecified: Secondary | ICD-10-CM

## 2017-04-30 LAB — POCT INR: INR: 1.7

## 2017-05-08 ENCOUNTER — Other Ambulatory Visit: Payer: Self-pay | Admitting: Urology

## 2017-05-08 DIAGNOSIS — N200 Calculus of kidney: Secondary | ICD-10-CM

## 2017-05-14 ENCOUNTER — Ambulatory Visit (INDEPENDENT_AMBULATORY_CARE_PROVIDER_SITE_OTHER): Payer: Medicare Other | Admitting: *Deleted

## 2017-05-14 DIAGNOSIS — I779 Disorder of arteries and arterioles, unspecified: Secondary | ICD-10-CM | POA: Diagnosis not present

## 2017-05-14 DIAGNOSIS — I4821 Permanent atrial fibrillation: Secondary | ICD-10-CM

## 2017-05-14 DIAGNOSIS — Z5181 Encounter for therapeutic drug level monitoring: Secondary | ICD-10-CM | POA: Diagnosis not present

## 2017-05-14 DIAGNOSIS — I482 Chronic atrial fibrillation: Secondary | ICD-10-CM

## 2017-05-14 LAB — POCT INR: INR: 3.9

## 2017-05-14 MED ORDER — WARFARIN SODIUM 1 MG PO TABS: ORAL_TABLET | ORAL | 3 refills | Status: DC

## 2017-05-16 ENCOUNTER — Ambulatory Visit (HOSPITAL_COMMUNITY)
Admission: RE | Admit: 2017-05-16 | Discharge: 2017-05-16 | Disposition: A | Discharge status: Home / Self Care | Payer: Medicare Other | Source: Ambulatory Visit | Attending: Urology | Admitting: Urology

## 2017-05-16 DIAGNOSIS — N281 Cyst of kidney, acquired: Secondary | ICD-10-CM | POA: Insufficient documentation

## 2017-05-16 DIAGNOSIS — N200 Calculus of kidney: Secondary | ICD-10-CM | POA: Diagnosis present

## 2017-05-23 ENCOUNTER — Ambulatory Visit (INDEPENDENT_AMBULATORY_CARE_PROVIDER_SITE_OTHER): Payer: Medicare Other | Admitting: *Deleted

## 2017-05-23 DIAGNOSIS — I4821 Permanent atrial fibrillation: Secondary | ICD-10-CM

## 2017-05-23 DIAGNOSIS — I482 Chronic atrial fibrillation: Secondary | ICD-10-CM | POA: Diagnosis not present

## 2017-05-23 DIAGNOSIS — I779 Disorder of arteries and arterioles, unspecified: Secondary | ICD-10-CM

## 2017-05-23 DIAGNOSIS — Z5181 Encounter for therapeutic drug level monitoring: Secondary | ICD-10-CM

## 2017-05-23 LAB — POCT INR: INR: 1.7

## 2017-05-30 ENCOUNTER — Ambulatory Visit (INDEPENDENT_AMBULATORY_CARE_PROVIDER_SITE_OTHER): Payer: Medicare Other | Admitting: Urology

## 2017-05-30 DIAGNOSIS — N200 Calculus of kidney: Secondary | ICD-10-CM

## 2017-05-30 DIAGNOSIS — N401 Enlarged prostate with lower urinary tract symptoms: Secondary | ICD-10-CM | POA: Diagnosis not present

## 2017-05-31 ENCOUNTER — Ambulatory Visit (INDEPENDENT_AMBULATORY_CARE_PROVIDER_SITE_OTHER): Payer: Medicare Other | Admitting: Otolaryngology

## 2017-05-31 DIAGNOSIS — J310 Chronic rhinitis: Secondary | ICD-10-CM

## 2017-05-31 DIAGNOSIS — H903 Sensorineural hearing loss, bilateral: Secondary | ICD-10-CM

## 2017-06-06 ENCOUNTER — Ambulatory Visit (INDEPENDENT_AMBULATORY_CARE_PROVIDER_SITE_OTHER): Payer: Medicare Other | Admitting: *Deleted

## 2017-06-06 DIAGNOSIS — Z5181 Encounter for therapeutic drug level monitoring: Secondary | ICD-10-CM

## 2017-06-06 DIAGNOSIS — I4821 Permanent atrial fibrillation: Secondary | ICD-10-CM

## 2017-06-06 DIAGNOSIS — I482 Chronic atrial fibrillation: Secondary | ICD-10-CM | POA: Diagnosis not present

## 2017-06-06 DIAGNOSIS — I779 Disorder of arteries and arterioles, unspecified: Secondary | ICD-10-CM

## 2017-06-06 LAB — POCT INR: INR: 1.4

## 2017-06-15 IMAGING — CR DG CHEST 1V PORT
1 series · 1 of 1 positions shown · non-contrast
Comparison: Chest x-rays dated [DATE] and [DATE].

CLINICAL DATA: Below the knee amputation on [REDACTED]. Acute
respiratory distress requiring intubation on [REDACTED]. Now
extubated.

EXAM:
PORTABLE CHEST 1 VIEW

[AP]
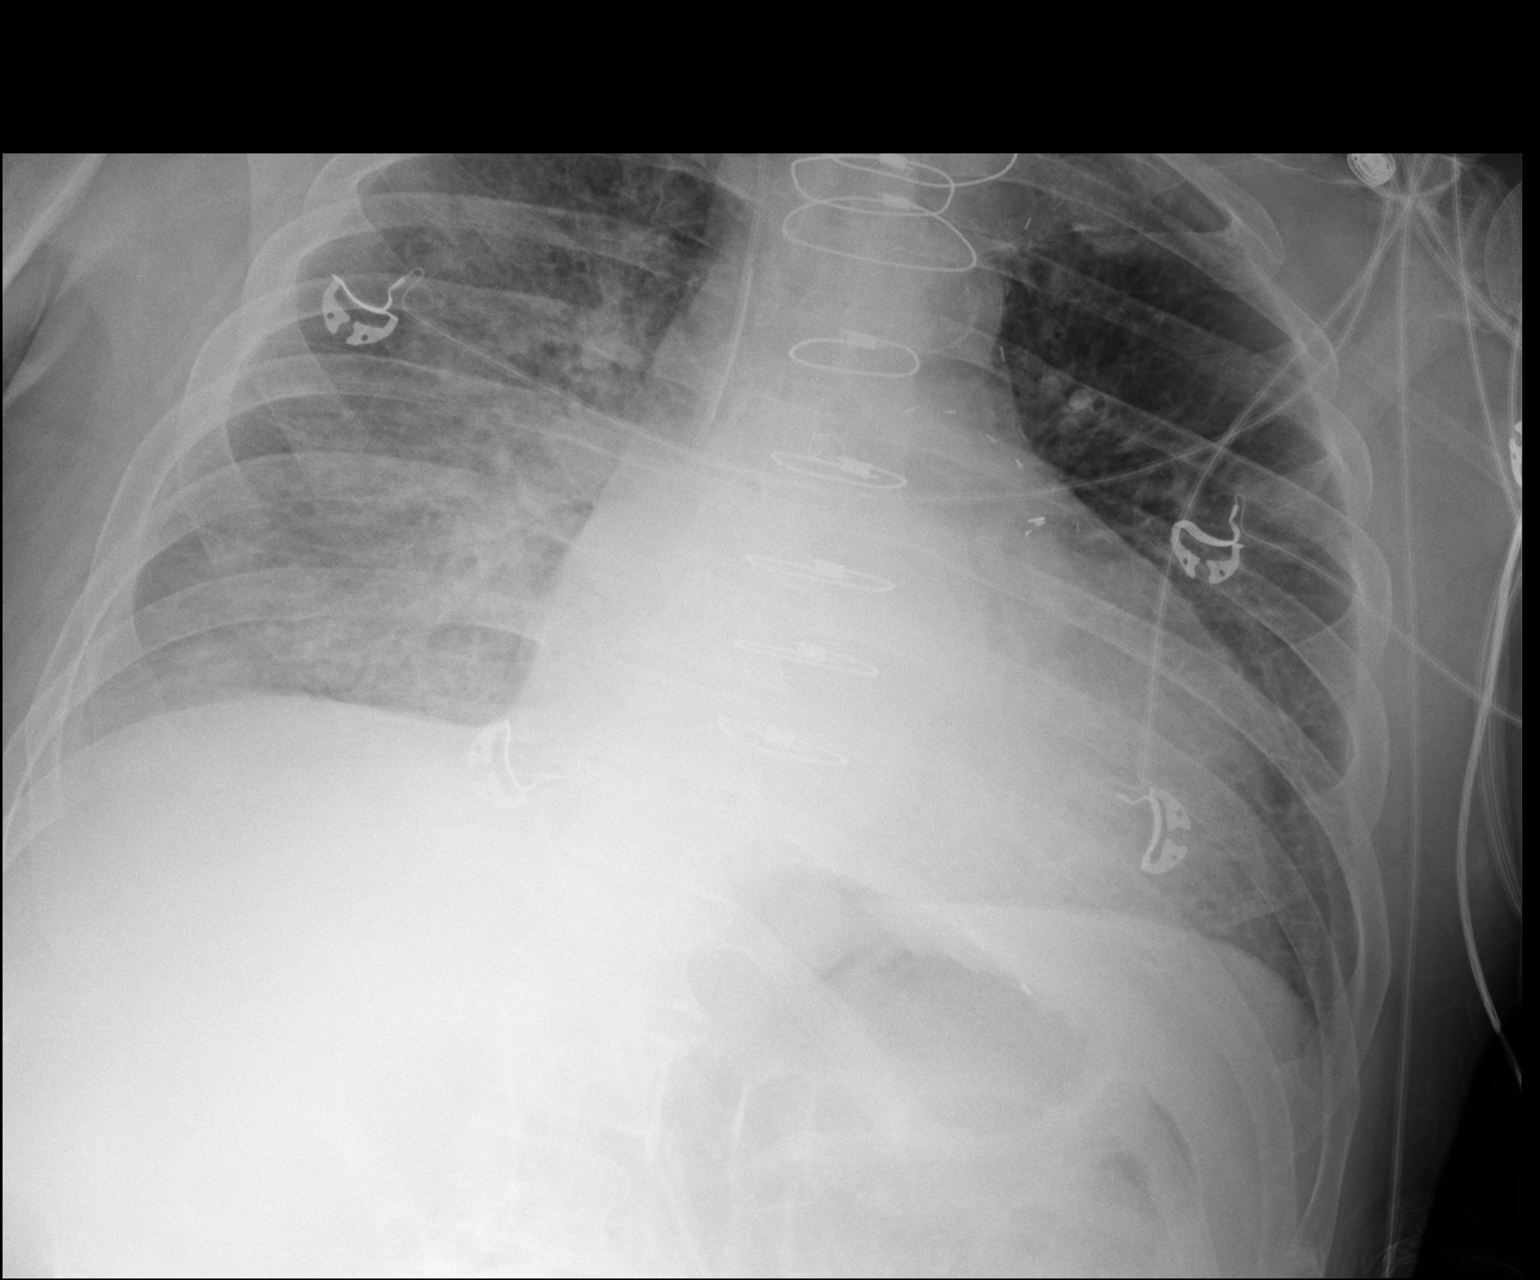

[1 of 1 positions shown; findings below may reference images not displayed]

FINDINGS: Increased opacities throughout the right lung and at the left lung
base, most likely edema. Cardiomegaly is stable. Patient is status
post median sternotomy for presumed CABG. Right-sided line is stable
in position with tip overlying the mid SVC. Endotracheal tube and
enteric tube have been removed in the interval.
IMPRESSION: 1. Cardiomegaly is stable.
2. Diffuse airspace opacities throughout the right lung and at the
left lung base, most suggestive of pulmonary edema related to volume
overload/CHF.

## 2017-06-16 IMAGING — CR DG CHEST 1V PORT
1 series · 1 of 1 positions shown · non-contrast
Comparison: 11/13/2015

CLINICAL DATA: Acute respiratory failure

EXAM:
PORTABLE CHEST 1 VIEW

[AP]
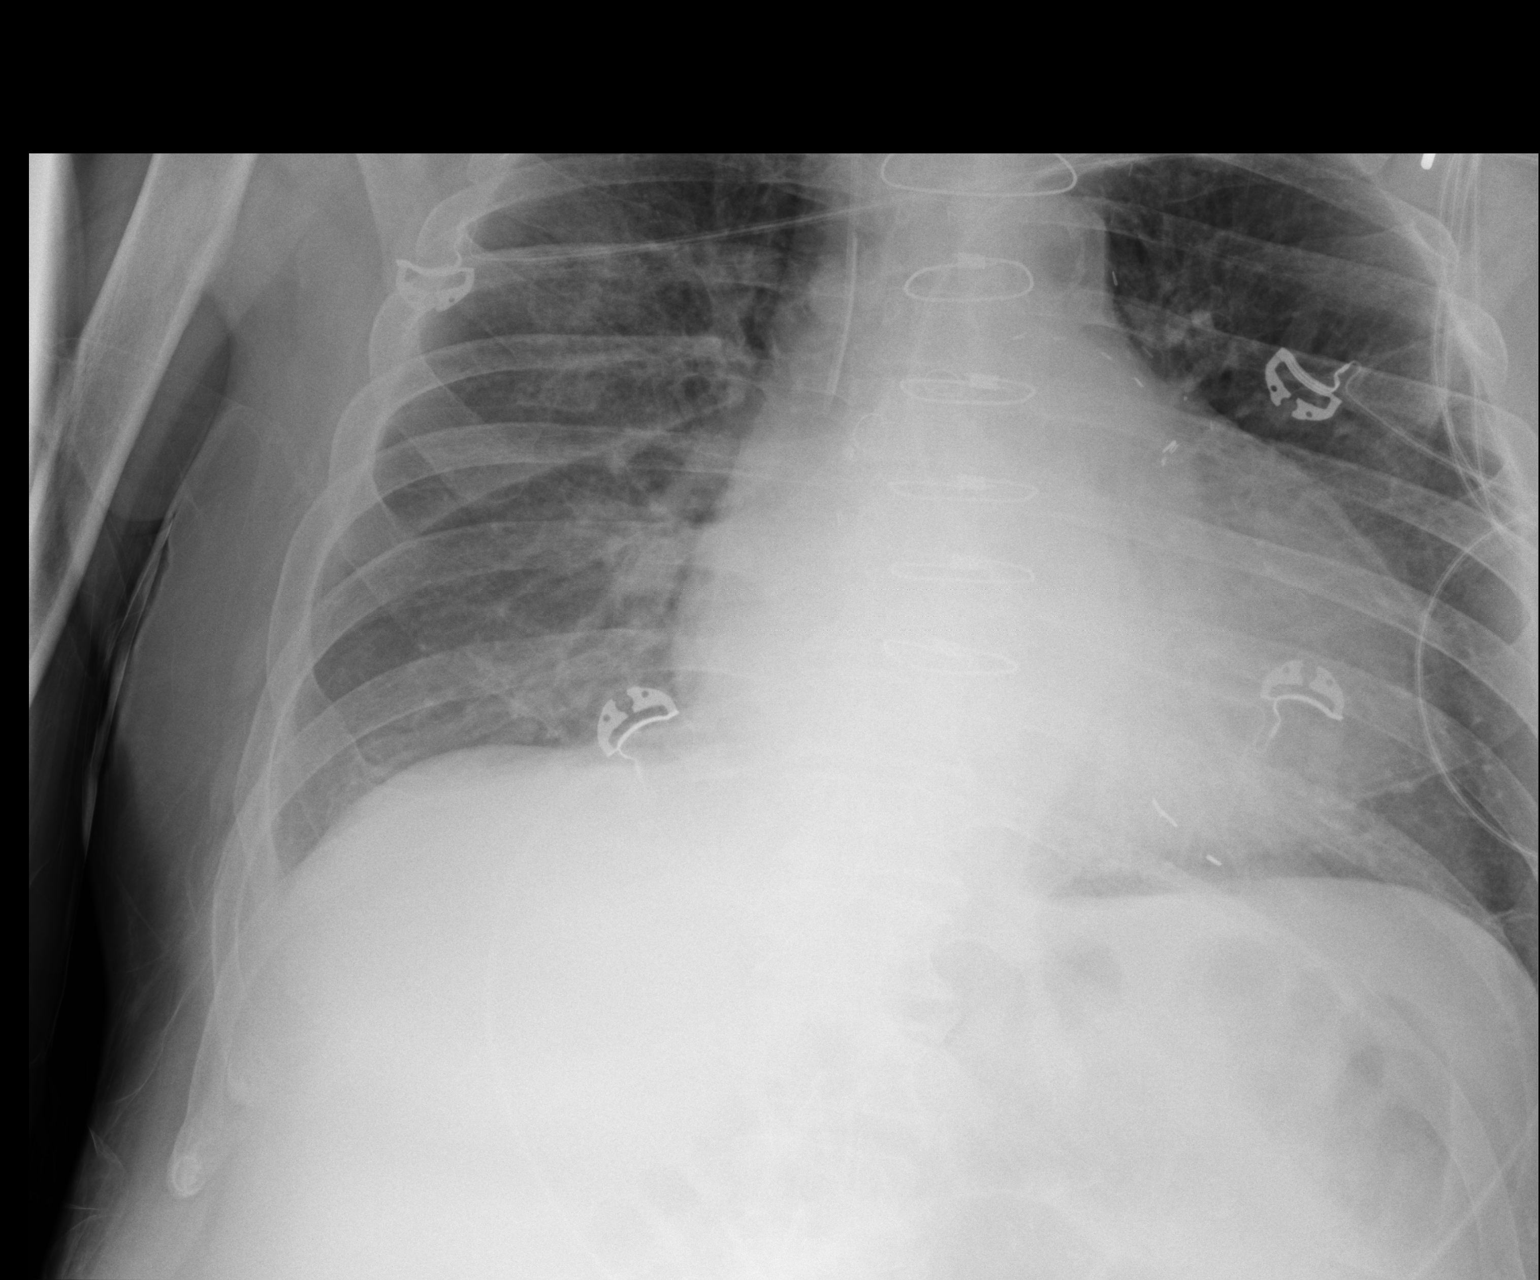

[1 of 1 positions shown; findings below may reference images not displayed]

FINDINGS: Cardiomegaly again noted. Right central line with tip in SVC again
noted. Status post CABG. Slight improvement in aeration. Residual
mild interstitial prominence in right upper lobe perihilar and right
infrahilar region probable residual mild interstitial edema. No
segmental infiltrates.
IMPRESSION: Right central line with tip in SVC again noted. Status post CABG.
Slight improvement in aeration. Residual mild interstitial
prominence in right upper lobe perihilar and right infrahilar region
probable residual mild interstitial edema.

## 2017-06-18 ENCOUNTER — Ambulatory Visit (INDEPENDENT_AMBULATORY_CARE_PROVIDER_SITE_OTHER): Payer: Medicare Other | Admitting: *Deleted

## 2017-06-18 DIAGNOSIS — I482 Chronic atrial fibrillation: Secondary | ICD-10-CM

## 2017-06-18 DIAGNOSIS — Z5181 Encounter for therapeutic drug level monitoring: Secondary | ICD-10-CM | POA: Diagnosis not present

## 2017-06-18 DIAGNOSIS — I779 Disorder of arteries and arterioles, unspecified: Secondary | ICD-10-CM

## 2017-06-18 DIAGNOSIS — I4821 Permanent atrial fibrillation: Secondary | ICD-10-CM

## 2017-06-18 LAB — POCT INR: INR: 1.8

## 2017-06-20 ENCOUNTER — Encounter (HOSPITAL_COMMUNITY): Payer: Medicare Other

## 2017-06-20 ENCOUNTER — Ambulatory Visit: Payer: Medicare Other | Admitting: Vascular Surgery

## 2017-07-04 ENCOUNTER — Ambulatory Visit (INDEPENDENT_AMBULATORY_CARE_PROVIDER_SITE_OTHER): Payer: Medicare Other | Admitting: *Deleted

## 2017-07-04 DIAGNOSIS — Z5181 Encounter for therapeutic drug level monitoring: Secondary | ICD-10-CM | POA: Diagnosis not present

## 2017-07-04 DIAGNOSIS — I482 Chronic atrial fibrillation: Secondary | ICD-10-CM | POA: Diagnosis not present

## 2017-07-04 DIAGNOSIS — I4821 Permanent atrial fibrillation: Secondary | ICD-10-CM

## 2017-07-04 LAB — POCT INR: INR: 2

## 2017-07-17 ENCOUNTER — Other Ambulatory Visit: Payer: Self-pay

## 2017-07-17 MED ORDER — BYSTOLIC 5 MG PO TABS: 5.0000 mg | ORAL_TABLET | Freq: Every day | ORAL | 3 refills | Status: DC

## 2017-07-18 ENCOUNTER — Ambulatory Visit (INDEPENDENT_AMBULATORY_CARE_PROVIDER_SITE_OTHER): Payer: Medicare Other | Admitting: *Deleted

## 2017-07-18 DIAGNOSIS — Z5181 Encounter for therapeutic drug level monitoring: Secondary | ICD-10-CM | POA: Diagnosis not present

## 2017-07-18 DIAGNOSIS — I4821 Permanent atrial fibrillation: Secondary | ICD-10-CM

## 2017-07-18 DIAGNOSIS — I482 Chronic atrial fibrillation: Secondary | ICD-10-CM

## 2017-07-18 LAB — POCT INR: INR: 1.5

## 2017-07-18 MED ORDER — WARFARIN SODIUM 1 MG PO TABS: ORAL_TABLET | ORAL | 4 refills | Status: DC

## 2017-07-30 ENCOUNTER — Ambulatory Visit (INDEPENDENT_AMBULATORY_CARE_PROVIDER_SITE_OTHER): Payer: Medicare Other | Admitting: *Deleted

## 2017-07-30 DIAGNOSIS — Z5181 Encounter for therapeutic drug level monitoring: Secondary | ICD-10-CM

## 2017-07-30 DIAGNOSIS — I482 Chronic atrial fibrillation: Secondary | ICD-10-CM

## 2017-07-30 DIAGNOSIS — I4821 Permanent atrial fibrillation: Secondary | ICD-10-CM

## 2017-07-30 LAB — POCT INR: INR: 1.6

## 2017-08-13 ENCOUNTER — Ambulatory Visit (INDEPENDENT_AMBULATORY_CARE_PROVIDER_SITE_OTHER): Payer: Medicare Other | Admitting: *Deleted

## 2017-08-13 DIAGNOSIS — Z5181 Encounter for therapeutic drug level monitoring: Secondary | ICD-10-CM

## 2017-08-13 DIAGNOSIS — I4821 Permanent atrial fibrillation: Secondary | ICD-10-CM

## 2017-08-13 DIAGNOSIS — I482 Chronic atrial fibrillation: Secondary | ICD-10-CM | POA: Diagnosis not present

## 2017-08-13 LAB — POCT INR: INR: 1.5

## 2017-08-22 ENCOUNTER — Other Ambulatory Visit (HOSPITAL_COMMUNITY)
Admission: RE | Admit: 2017-08-22 | Discharge: 2017-08-22 | Disposition: A | Discharge status: Home / Self Care | Payer: Medicare Other | Source: Ambulatory Visit | Attending: Cardiovascular Disease | Admitting: Cardiovascular Disease

## 2017-08-22 ENCOUNTER — Ambulatory Visit (INDEPENDENT_AMBULATORY_CARE_PROVIDER_SITE_OTHER): Payer: Medicare Other | Admitting: *Deleted

## 2017-08-22 ENCOUNTER — Ambulatory Visit (INDEPENDENT_AMBULATORY_CARE_PROVIDER_SITE_OTHER): Payer: Medicare Other | Admitting: Cardiovascular Disease

## 2017-08-22 ENCOUNTER — Encounter: Payer: Self-pay | Admitting: Cardiovascular Disease

## 2017-08-22 VITALS — BP 144/64 | HR 63 | Ht 68.0 in | Wt 182.0 lb

## 2017-08-22 DIAGNOSIS — I209 Angina pectoris, unspecified: Secondary | ICD-10-CM | POA: Diagnosis not present

## 2017-08-22 DIAGNOSIS — Z5181 Encounter for therapeutic drug level monitoring: Secondary | ICD-10-CM | POA: Diagnosis not present

## 2017-08-22 DIAGNOSIS — I739 Peripheral vascular disease, unspecified: Secondary | ICD-10-CM

## 2017-08-22 DIAGNOSIS — I482 Chronic atrial fibrillation: Secondary | ICD-10-CM | POA: Diagnosis not present

## 2017-08-22 DIAGNOSIS — E7800 Pure hypercholesterolemia, unspecified: Secondary | ICD-10-CM | POA: Diagnosis not present

## 2017-08-22 DIAGNOSIS — I25708 Atherosclerosis of coronary artery bypass graft(s), unspecified, with other forms of angina pectoris: Secondary | ICD-10-CM

## 2017-08-22 DIAGNOSIS — I779 Disorder of arteries and arterioles, unspecified: Secondary | ICD-10-CM | POA: Diagnosis not present

## 2017-08-22 DIAGNOSIS — I4821 Permanent atrial fibrillation: Secondary | ICD-10-CM

## 2017-08-22 DIAGNOSIS — I10 Essential (primary) hypertension: Secondary | ICD-10-CM

## 2017-08-22 LAB — LIPID PANEL
CHOL/HDL RATIO: 3.7 ratio
Cholesterol: 104 mg/dL (ref 0–200)
HDL: 28 mg/dL — AB (ref >40)
LDL Cholesterol: 64 mg/dL (ref 0–99)
Triglycerides: 58 mg/dL (ref <150)
VLDL: 12 mg/dL (ref 0–40)

## 2017-08-22 LAB — POCT INR: INR: 1.6

## 2017-08-22 MED ORDER — WARFARIN SODIUM 1 MG PO TABS: ORAL_TABLET | ORAL | 4 refills | Status: DC

## 2017-08-22 NOTE — Progress Notes (Signed)
SUBJECTIVE: The patient presents for follow-up of atrial fibrillation and coronary artery disease. He has a history of CABG, diabetes, hypertension, and hyperlipidemia.  The patient denies any symptoms of chest pain, palpitations, shortness of breath, lightheadedness, dizziness, leg swelling, orthopnea, PND, and syncope.  She continues to have an intermittent runny nose and said he drools more from the left side of his mouth.  It is the same side he underwent jaw surgery for cancer.      Review of Systems: As per "subjective", otherwise negative.  Allergies  Allergen Reactions  . Lipitor [Atorvastatin] Other (See Comments)    SEVERE HEADACHE  . Simvastatin Other (See Comments)    MUSCLE ACHES  . Penicillins Rash    Has patient had a PCN reaction causing immediate rash, facial/tongue/throat swelling, SOB or lightheadedness with hypotension: Yes Has patient had a PCN reaction causing severe rash involving mucus membranes or skin necrosis: No Has patient had a PCN reaction that required hospitalization No Has patient had a PCN reaction occurring within the last 10 years: No If all of the above answers are "NO", then may proceed with Cephalosporin use.   . Sulfa Antibiotics Rash    Tolerates silver sulfadiazine cream at home    Current Outpatient Prescriptions  Medication Sig Dispense Refill  . amLODipine (NORVASC) 10 MG tablet Take 1 tablet (10 mg total) by mouth daily. 90 tablet 3  . aspirin EC 81 MG tablet Take 81 mg by mouth daily.    Marland Kitchen BYSTOLIC 5 MG tablet Take 1 tablet (5 mg total) by mouth daily. 90 tablet 3  . clindamycin (CLEOCIN) 300 MG capsule     . Difluprednate (DUREZOL) 0.05 % EMUL Place 1 drop into both eyes 2 (two) times daily.     . feeding supplement (BOOST / RESOURCE BREEZE) LIQD Take 1 Container by mouth 3 (three) times daily between meals. (Patient taking differently: Take 1 Container by mouth 2 (two) times daily. ) 90 Container 4  . ferrous sulfate 325  (65 FE) MG tablet Take 65 mg by mouth daily with breakfast.    . losartan-hydrochlorothiazide (HYZAAR) 100-25 MG tablet Take 1 tablet by mouth daily. 90 tablet 3  . Multiple Vitamins-Minerals (CENTRUM SILVER PO) Take 1 tablet by mouth daily.      Marland Kitchen nystatin cream (MYCOSTATIN) Apply 1 application topically 3 (three) times daily.    . Omega-3 Fatty Acids (FISH OIL) 1200 MG CAPS Take 1,200 mg by mouth daily.    . Probiotic Product (PHILLIPS COLON HEALTH PO) Take 1 capsule by mouth daily.     . silver sulfADIAZINE (SILVADENE) 1 % cream Apply topically daily. (Patient taking differently: Apply 1 application topically daily. Applied to affected area of foot.) 50 g 0  . warfarin (COUMADIN) 1 MG tablet Take 1 tablet daily except 2 tablets on Mondays and Fridays or as directed 120 tablet 4   No current facility-administered medications for this visit.     Past Medical History:  Diagnosis Date  . Arteriosclerotic cardiovascular disease (ASCVD)    CABG in 1990s; 2002 total obstruction of LAD, CX and RCA with patent grafts and nl EF; Stress nuc. 2008 - mild LV dilation; normal EF; questionable small anteroapical scar; no ischemia  . Arthritis    "left knee" (11/08/2015)  . Atrial fibrillation ([place])   . Cellulitis 11/08/2015   "both feet"  . Chronic anticoagulation   . Dental crowns present   . Diabetic peripheral neuropathy ([place])  bilateral lower leg, hands  . History of blood transfusion    "related to my leg surgery?"  . Hypertension    states under control with meds., has been on med. x "long time"  . Iron deficiency anemia   . Jaw cancer ([place]) 1990s   "squamous cell"  . Myocardial infarction ([place]) 1990s   "after my jaw OR"  . Pedal edema    "not a lot", per pt.  . Peripheral vascular disease ([place])   . Presence of retained hardware 07/2015   failed hardware jaw  . Runny nose 07/27/2015   clear drainage, per pt.  . Type II diabetes mellitus ([place])    "diet controlled" (11/08/2015)     Past Surgical History:  Procedure Laterality Date  . AMPUTATION Left 11/11/2015   Procedure: LEFT ABOVE KNEE AMPUTATION;  Surgeon: Angelia Mould, MD;  Location: [facility];  Service: Vascular;  Laterality: Left;  . CARDIAC CATHETERIZATION  1990s; 04/16/2001  . CATARACT EXTRACTION W/ INTRAOCULAR LENS  IMPLANT, BILATERAL Bilateral 09/2015  . COLONOSCOPY N/A 11/27/2014   Procedure: COLONOSCOPY;  Surgeon: Rogene Houston, MD;  [facility];  Service: Endoscopy;  Laterality: N/A;  830  . CORONARY ARTERY BYPASS GRAFT  1990's   "CABG X4"  . FRACTURE SURGERY    . I&D EXTREMITY Left 11/10/2015   Procedure: INCISION AND DRAINAGE LEFT FOOT, AMPUTATION OF LEFT THIRD TOE;  Surgeon: Conrad Woodward, MD;  Location: [facility];  Service: Vascular;  Laterality: Left;  . INCISION AND DRAINAGE ABSCESS Left 06/16/2005   wide exc. abscess 5th toe  . MANDIBULAR HARDWARE REMOVAL Left 08/02/2015   Procedure:  HARDWARE REMOVAL TWO MANDIBULAR SCREWS;  Surgeon: Jannette Fogo, DDS;  Location: [facility];  Service: Oral Surgery;  Laterality: Left;  . ORIF TIBIA FRACTURE Left ~ 1970  . PERIPHERAL VASCULAR CATHETERIZATION N/A 08/14/2016   Procedure: Abdominal Aortogram w/Lower Extremity;  Surgeon: Angelia Mould, MD;  Location: [facility];  Service: Cardiovascular;  Laterality: N/A;  . SQUAMOUS CELL CARCINOMA EXCISION Left 1993   "took my jaw out; got cadavar in there now"  . TONSILLECTOMY      Social History   Social History  . Marital status: Married    Spouse name: N/A  . Number of children: N/A  . Years of education: N/A   Occupational History  . Not on file.   Social History Main Topics  . Smoking status: Never Smoker  . Smokeless tobacco: Never Used  . Alcohol use No  . Drug use: No  . Sexual activity: Yes   Other Topics Concern  . Not on file   Social History Narrative  . No narrative on file     Vitals:   08/22/17 0825  BP: (!) 144/64  Pulse: 63   SpO2: 99%  Weight: 182 lb (82.6 kg)  Height: 5' 8"  (1.727 m)    Wt Readings from Last 3 Encounters:  08/22/17 182 lb (82.6 kg)  04/18/17 174 lb (78.9 kg)  03/07/17 182 lb (82.6 kg)     PHYSICAL EXAM General: NAD HEENT: Normal. Neck: No JVD, no thyromegaly. Lungs: Clear to auscultation bilaterally with normal respiratory effort. CV: Nondisplaced PMI.  Regular rate and irregular rhythm, normal S1/S2, no S3/, no murmur. No right leg edema. Left AKA. No carotid bruit.    Abdomen: Soft, nontender, no distention.  Neurologic: Alert and oriented.  Psych: Normal affect. Skin: Normal. Musculoskeletal: No gross deformities.    ECG:  Most recent ECG reviewed.   Labs: Lab Results  Component Value Date/Time   K 2.9 (L) 10/29/2016 08:11 AM   BUN 26 (H) 10/29/2016 08:11 AM   CREATININE 1.10 10/29/2016 08:11 AM   CREATININE 0.84 06/09/2013 12:28 PM   ALT 39 11/11/2015 09:30 AM   TSH 4.746 (H) 11/11/2015 09:30 AM   HGB 13.1 10/29/2016 08:11 AM     Lipids: Lab Results  Component Value Date/Time   LDLCALC 61 10/04/2009   LDLCALC 61 10/04/2009   CHOL 112 10/04/2009   CHOL 293 10/04/2009   TRIG 113 10/04/2009   TRIG 113 10/04/2009   HDL 28 10/04/2009   HDL 28 10/04/2009       ASSESSMENT AND PLAN:  1. CAD: Stable ischemic heart disease. Continue ASA and Bystolic.  2. Atrial fibrillation: Rate controlled on Bystolic and anticoagulated with warfarin.   3. Essential HTN: Mildly elevated.  He says systolic blood pressure is normally in the 130 range.  No changes to therapy today.  4. Hyperlipidemia: Unable to tolerate statins.  Possibly a candidate for PCSK-9 inhibitor. I will obtain lipids.  5. PVD: Continue ASA, unable to tolerate statins. Followed by vascular surgery.      Disposition: Follow up 1 year   Kate Sable, M.D., F.A.C.C.

## 2017-08-22 NOTE — Patient Instructions (Signed)
Medication Instructions:  Your physician recommends that you continue on your current medications as directed. Please refer to the Current Medication list given to you today.   Labwork: ASAP   Testing/Procedures: NONE  Follow-Up: Your physician wants you to follow-up in: 1 YEAR  You will receive a reminder letter in the mail two months in advance. If you don't receive a letter, please call our office to schedule the follow-up appointment.   Any Other Special Instructions Will Be Listed Below (If Applicable).     If you need a refill on your cardiac medications before your next appointment, please call your pharmacy.

## 2017-09-05 ENCOUNTER — Ambulatory Visit (INDEPENDENT_AMBULATORY_CARE_PROVIDER_SITE_OTHER): Payer: Medicare Other | Admitting: *Deleted

## 2017-09-05 DIAGNOSIS — I482 Chronic atrial fibrillation: Secondary | ICD-10-CM

## 2017-09-05 DIAGNOSIS — Z5181 Encounter for therapeutic drug level monitoring: Secondary | ICD-10-CM | POA: Diagnosis not present

## 2017-09-05 DIAGNOSIS — I4821 Permanent atrial fibrillation: Secondary | ICD-10-CM

## 2017-09-05 LAB — POCT INR: INR: 2.2

## 2017-09-24 ENCOUNTER — Ambulatory Visit (INDEPENDENT_AMBULATORY_CARE_PROVIDER_SITE_OTHER): Payer: Medicare Other | Admitting: *Deleted

## 2017-09-24 DIAGNOSIS — Z5181 Encounter for therapeutic drug level monitoring: Secondary | ICD-10-CM

## 2017-09-24 DIAGNOSIS — I482 Chronic atrial fibrillation: Secondary | ICD-10-CM

## 2017-09-24 DIAGNOSIS — I4821 Permanent atrial fibrillation: Secondary | ICD-10-CM

## 2017-09-24 LAB — POCT INR: INR: 2.7

## 2017-10-08 ENCOUNTER — Ambulatory Visit (INDEPENDENT_AMBULATORY_CARE_PROVIDER_SITE_OTHER): Payer: Medicare Other | Admitting: *Deleted

## 2017-10-08 DIAGNOSIS — I482 Chronic atrial fibrillation: Secondary | ICD-10-CM | POA: Diagnosis not present

## 2017-10-08 DIAGNOSIS — I4821 Permanent atrial fibrillation: Secondary | ICD-10-CM

## 2017-10-08 DIAGNOSIS — Z5181 Encounter for therapeutic drug level monitoring: Secondary | ICD-10-CM | POA: Diagnosis not present

## 2017-10-08 LAB — POCT INR: INR: 4.1

## 2017-10-22 ENCOUNTER — Other Ambulatory Visit (HOSPITAL_COMMUNITY)
Admission: RE | Admit: 2017-10-22 | Discharge: 2017-10-22 | Disposition: A | Discharge status: Home / Self Care | Payer: Medicare Other | Source: Ambulatory Visit | Attending: Cardiovascular Disease | Admitting: Cardiovascular Disease

## 2017-10-22 ENCOUNTER — Ambulatory Visit (INDEPENDENT_AMBULATORY_CARE_PROVIDER_SITE_OTHER): Payer: Medicare Other | Admitting: *Deleted

## 2017-10-22 DIAGNOSIS — I482 Chronic atrial fibrillation: Secondary | ICD-10-CM | POA: Insufficient documentation

## 2017-10-22 DIAGNOSIS — Z5181 Encounter for therapeutic drug level monitoring: Secondary | ICD-10-CM

## 2017-10-22 DIAGNOSIS — I4821 Permanent atrial fibrillation: Secondary | ICD-10-CM

## 2017-10-22 LAB — PROTIME-INR
INR: 5.03 — AB
PROTHROMBIN TIME: 46.3 s — AB (ref 11.4–15.2)

## 2017-10-22 LAB — POCT INR: INR: 7.1

## 2017-10-29 ENCOUNTER — Ambulatory Visit (INDEPENDENT_AMBULATORY_CARE_PROVIDER_SITE_OTHER): Payer: Medicare Other | Admitting: *Deleted

## 2017-10-29 DIAGNOSIS — Z5181 Encounter for therapeutic drug level monitoring: Secondary | ICD-10-CM

## 2017-10-29 DIAGNOSIS — I482 Chronic atrial fibrillation: Secondary | ICD-10-CM

## 2017-10-29 DIAGNOSIS — I4821 Permanent atrial fibrillation: Secondary | ICD-10-CM

## 2017-10-29 LAB — POCT INR: INR: 1.5

## 2017-10-29 NOTE — Patient Instructions (Signed)
Restart coumadin 2 tablets daily. Took last dose of clindamycin on 10/26/17 Recheck 11/05/17

## 2017-11-07 ENCOUNTER — Ambulatory Visit (INDEPENDENT_AMBULATORY_CARE_PROVIDER_SITE_OTHER): Payer: Medicare Other | Admitting: *Deleted

## 2017-11-07 DIAGNOSIS — I482 Chronic atrial fibrillation: Secondary | ICD-10-CM | POA: Diagnosis not present

## 2017-11-07 DIAGNOSIS — Z5181 Encounter for therapeutic drug level monitoring: Secondary | ICD-10-CM

## 2017-11-07 DIAGNOSIS — I4821 Permanent atrial fibrillation: Secondary | ICD-10-CM

## 2017-11-07 LAB — POCT INR: INR: 3.2

## 2017-11-07 NOTE — Patient Instructions (Signed)
Take coumadin 1 tablet tonight then decrease dose to 2 tablets daily except 1 1/2 tablets on Mondays, Wednesdays and Fridays. Took last dose of clindamycin on 10/26/17 Recheck 11/19/17

## 2017-11-19 ENCOUNTER — Ambulatory Visit (INDEPENDENT_AMBULATORY_CARE_PROVIDER_SITE_OTHER): Payer: Medicare Other | Admitting: *Deleted

## 2017-11-19 DIAGNOSIS — Z5181 Encounter for therapeutic drug level monitoring: Secondary | ICD-10-CM | POA: Diagnosis not present

## 2017-11-19 DIAGNOSIS — I4821 Permanent atrial fibrillation: Secondary | ICD-10-CM

## 2017-11-19 DIAGNOSIS — I482 Chronic atrial fibrillation: Secondary | ICD-10-CM

## 2017-11-19 LAB — POCT INR: INR: 5.7

## 2017-11-19 NOTE — Patient Instructions (Signed)
Hold coumadin tonight and tomorrow night then decrease dose to 1 1/2 tablets daily except 2 tablets on Sundays, Tuesdays and Thursdays Took last dose of clindamycin on 10/26/17 Recheck 11/26/17

## 2017-11-26 ENCOUNTER — Other Ambulatory Visit (HOSPITAL_COMMUNITY)
Admission: RE | Admit: 2017-11-26 | Discharge: 2017-11-26 | Disposition: A | Discharge status: Home / Self Care | Payer: Medicare Other | Source: Ambulatory Visit | Attending: Cardiovascular Disease | Admitting: Cardiovascular Disease

## 2017-11-26 ENCOUNTER — Ambulatory Visit (INDEPENDENT_AMBULATORY_CARE_PROVIDER_SITE_OTHER): Payer: Medicare Other | Admitting: *Deleted

## 2017-11-26 DIAGNOSIS — Z5181 Encounter for therapeutic drug level monitoring: Secondary | ICD-10-CM

## 2017-11-26 DIAGNOSIS — I482 Chronic atrial fibrillation: Secondary | ICD-10-CM

## 2017-11-26 DIAGNOSIS — I4821 Permanent atrial fibrillation: Secondary | ICD-10-CM

## 2017-11-26 LAB — POCT INR: INR: 7.5

## 2017-11-26 LAB — PROTIME-INR
INR: 5.75 — AB
Prothrombin Time: 51.4 seconds — ABNORMAL HIGH (ref 11.4–15.2)

## 2017-11-26 NOTE — Patient Instructions (Signed)
POC INR 7.5   Sent to lab INR 5.75 Hold coumadin tonight and tomorrow night then decrease dose to 1 1/2 tablets daily  Took 8 pills of clindamycin since 11/19/17 for cellulitis Rt leg. Recheck in 1 week

## 2017-11-28 ENCOUNTER — Ambulatory Visit (INDEPENDENT_AMBULATORY_CARE_PROVIDER_SITE_OTHER): Payer: Medicare Other | Admitting: Vascular Surgery

## 2017-11-28 ENCOUNTER — Ambulatory Visit (HOSPITAL_COMMUNITY)
Admission: RE | Admit: 2017-11-28 | Discharge: 2017-11-28 | Disposition: A | Discharge status: Home / Self Care | Payer: Medicare Other | Source: Ambulatory Visit | Attending: Vascular Surgery | Admitting: Vascular Surgery

## 2017-11-28 ENCOUNTER — Encounter: Payer: Self-pay | Admitting: Vascular Surgery

## 2017-11-28 VITALS — BP 142/70 | HR 58 | Temp 97.2°F | Resp 20 | Ht 68.0 in | Wt 182.0 lb

## 2017-11-28 DIAGNOSIS — I739 Peripheral vascular disease, unspecified: Secondary | ICD-10-CM

## 2017-11-28 DIAGNOSIS — R9389 Abnormal findings on diagnostic imaging of other specified body structures: Secondary | ICD-10-CM | POA: Insufficient documentation

## 2017-11-28 DIAGNOSIS — R0989 Other specified symptoms and signs involving the circulatory and respiratory systems: Secondary | ICD-10-CM | POA: Insufficient documentation

## 2017-11-28 DIAGNOSIS — I10 Essential (primary) hypertension: Secondary | ICD-10-CM | POA: Insufficient documentation

## 2017-11-28 NOTE — Progress Notes (Signed)
Patient name: [name] MRN: [id number] DOB: 02/03/1936 Sex: male  REASON FOR VISIT:   Follow-up of peripheral vascular disease.  HPI:   REFOEL [name] is a pleasant 82 y.o. male who I last saw on 11/22/2016.  He had developed a nonhealing wound of the right leg.  Of note he is status post left above-the-knee amputation.  Patient had undergone a previous arteriogram on 08/14/2016 which showed occlusion of the anterior tibial artery.  Otherwise he had no significant infrainguinal arterial occlusive disease.  I felt that he had adequate circulation to heal the wound on his right leg.  The time I saw him last he was having some issues with leg swelling we discussed the importance of leg elevation.  I ordered follow-up ABIs in 1 year and he returns for that office visit.  Since I saw him last he is developed increased serous drainage from the right leg wounds.  He has been treated with outpatient antibiotics but this was just started several days ago.  He describes significant weeping from the wound which involves the entire anterior medial right leg.  He has a left AKA and has not been ambulatory with his prosthesis.  He denies rest pain in the right foot.  He elevates his legs and does not have any pain with leg elevation.  He denies fever or chills.  Past Medical History:  Diagnosis Date  . Arteriosclerotic cardiovascular disease (ASCVD)    CABG in 1990s; 2002 total obstruction of LAD, CX and RCA with patent grafts and nl EF; Stress nuc. 2008 - mild LV dilation; normal EF; questionable small anteroapical scar; no ischemia  . Arthritis    "left knee" (11/08/2015)  . Atrial fibrillation ([place])   . Cellulitis 11/08/2015   "both feet"  . Chronic anticoagulation   . Dental crowns present   . Diabetic peripheral neuropathy ([facility])    bilateral lower leg, hands  . History of blood transfusion    "related to my leg surgery?"  . Hypertension    states under control with meds., has been on med.  x "long time"  . Iron deficiency anemia   . Jaw cancer ([place]) 1990s   "squamous cell"  . Myocardial infarction ([place]) 1990s   "after my jaw OR"  . Pedal edema    "not a lot", per pt.  . Peripheral vascular disease ([place])   . Presence of retained hardware 07/2015   failed hardware jaw  . Runny nose 07/27/2015   clear drainage, per pt.  . Type II diabetes mellitus ([place])    "diet controlled" (11/08/2015)    Family History  Problem Relation Age of Onset  . Heart disease Mother     SOCIAL HISTORY: He is not a smoker. Social History   Tobacco Use  . Smoking status: Never Smoker  . Smokeless tobacco: Never Used  Substance Use Topics  . Alcohol use: No    Alcohol/week: 0.0 oz    Allergies  Allergen Reactions  . Lipitor [Atorvastatin] Other (See Comments)    SEVERE HEADACHE  . Simvastatin Other (See Comments)    MUSCLE ACHES  . Penicillins Rash    Has patient had a PCN reaction causing immediate rash, facial/tongue/throat swelling, SOB or lightheadedness with hypotension: Yes Has patient had a PCN reaction causing severe rash involving mucus membranes or skin necrosis: No Has patient had a PCN reaction that required hospitalization No Has patient had a PCN reaction occurring within the last 10 years: No If  all of the above answers are "NO", then may proceed with Cephalosporin use.   . Sulfa Antibiotics Rash    Tolerates silver sulfadiazine cream at home    Current Outpatient Medications  Medication Sig Dispense Refill  . amLODipine (NORVASC) 10 MG tablet Take 1 tablet (10 mg total) by mouth daily. 90 tablet 3  . aspirin EC 81 MG tablet Take 81 mg by mouth daily.    Marland Kitchen BYSTOLIC 5 MG tablet Take 1 tablet (5 mg total) by mouth daily. 90 tablet 3  . Difluprednate (DUREZOL) 0.05 % EMUL Place 1 drop into both eyes 2 (two) times daily.     Marland Kitchen doxycycline (ADOXA) 100 MG tablet Take 100 mg by mouth 2 (two) times daily.    . feeding supplement (BOOST / RESOURCE BREEZE) LIQD Take 1  Container by mouth 3 (three) times daily between meals. (Patient taking differently: Take 1 Container by mouth 2 (two) times daily. ) 90 Container 4  . ferrous sulfate 325 (65 FE) MG tablet Take 65 mg by mouth daily with breakfast.    . losartan-hydrochlorothiazide (HYZAAR) 100-25 MG tablet Take 1 tablet by mouth daily. 90 tablet 3  . lovastatin (MEVACOR) 10 MG tablet Take 10 mg by mouth at bedtime.    . Multiple Vitamins-Minerals (CENTRUM SILVER PO) Take 1 tablet by mouth daily.      Marland Kitchen nystatin cream (MYCOSTATIN) Apply 1 application topically 3 (three) times daily.    . Omega-3 Fatty Acids (FISH OIL) 1200 MG CAPS Take 1,200 mg by mouth daily.    . Probiotic Product (PHILLIPS COLON HEALTH PO) Take 1 capsule by mouth daily.     . silver sulfADIAZINE (SILVADENE) 1 % cream Apply topically daily. (Patient taking differently: Apply 1 application topically daily. Applied to affected area of foot.) 50 g 0  . warfarin (COUMADIN) 1 MG tablet Take 2 tablets daily or as directed 60 tablet 4  . clindamycin (CLEOCIN) 300 MG capsule      No current facility-administered medications for this visit.     REVIEW OF SYSTEMS:  [X]  denotes positive finding, [ ]  denotes negative finding Cardiac  Comments:  Chest pain or chest pressure:    Shortness of breath upon exertion:    Short of breath when lying flat:    Irregular heart rhythm:        Vascular    Pain in calf, thigh, or hip brought on by ambulation:    Pain in feet at night that wakes you up from your sleep:     Blood clot in your veins:    Leg swelling:  x       Pulmonary    Oxygen at home:    Productive cough:     Wheezing:         Neurologic    Sudden weakness in arms or legs:     Sudden numbness in arms or legs:     Sudden onset of difficulty speaking or slurred speech:    Temporary loss of vision in one eye:     Problems with dizziness:         Gastrointestinal    Blood in stool:     Vomited blood:         Genitourinary    Burning  when urinating:     Blood in urine:        Psychiatric    Major depression:         Hematologic    Bleeding problems:  x   Problems with blood clotting too easily:        Skin    Rashes or ulcers:        Constitutional    Fever or chills:     PHYSICAL EXAM:   Vitals:   11/28/17 1609 11/28/17 1610  BP: (!) 144/72 (!) 142/70  Pulse: (!) 58   Resp: 20   Temp: (!) 97.2 F (36.2 C)   TempSrc: Oral   SpO2: 96%   Weight: 182 lb (82.6 kg)   Height: 5' 8"  (1.727 m)     GENERAL: The patient is a well-nourished male, in no acute distress. The vital signs are documented above. CARDIAC: There is a regular rate and rhythm.  VASCULAR: I do not detect carotid bruits. He has a right femoral pulse.  I cannot palpate popliteal or pedal pulses.  He has a brisk monophasic posterior tibial and dorsalis pedis signal with the Doppler. He has hyperpigmentation on the right consistent with chronic venous insufficiency. PULMONARY: There is good air exchange bilaterally without wheezing or rales. ABDOMEN: Soft and non-tender with normal pitched bowel sounds.  MUSCULOSKELETAL: He has a left above-the-knee amputation. NEUROLOGIC: No focal weakness or paresthesias are detected. SKIN: There are no ulcers or rashes noted. PSYCHIATRIC: The patient has a normal affect.  DATA:    LOWER EXTREMITY ARTERIAL DOPPLER STUDY: I have independently interpreted his lower extremity arterial Doppler study.  On the right side he has monophasic Doppler signals in the dorsalis pedis and posterior tibial positions.  ABIs were not reliable as the arteries are calcified.  Toe pressure on the right is 72 mmHg.  MEDICAL ISSUES:   PERIPHERAL VASCULAR DISEASE AND CHRONIC VENOUS INSUFFICIENCY: This patient has evidence of infrainguinal arterial occlusive disease on the right and also chronic venous insufficiency.  However I think he has adequate circulation to heal these wounds based on a toe pressure of 76 mmHg.  Typically  a pressure of greater than 50 is adequate for healing.  This reason I recommended continued aggressive leg elevation.  He will also continue dressing changes with a mild pressure gradient.  I have asked him to continue his antibiotics as he continues to have some cellulitis.  I think with aggressive treatment of his chronic venous insufficiency the wound should heal.  If it does not we would have to consider arteriography.  I have ordered a follow-up visit in 2 months and I will see him back at that time.  He knows to call sooner if he has problems.  Deitra Mayo [facility] [phone]

## 2017-12-03 ENCOUNTER — Ambulatory Visit (INDEPENDENT_AMBULATORY_CARE_PROVIDER_SITE_OTHER): Payer: Medicare Other | Admitting: *Deleted

## 2017-12-03 DIAGNOSIS — I4821 Permanent atrial fibrillation: Secondary | ICD-10-CM

## 2017-12-03 DIAGNOSIS — I482 Chronic atrial fibrillation: Secondary | ICD-10-CM

## 2017-12-03 DIAGNOSIS — Z5181 Encounter for therapeutic drug level monitoring: Secondary | ICD-10-CM

## 2017-12-03 LAB — POCT INR: INR: 2.4

## 2017-12-03 NOTE — Patient Instructions (Signed)
Continue coumadin 1 1/2 tablets daily  On doxycycline 167m bid for rt leg.  Started 11/26/17  Finishes 2/14 Recheck in 1 week

## 2017-12-10 ENCOUNTER — Ambulatory Visit (INDEPENDENT_AMBULATORY_CARE_PROVIDER_SITE_OTHER): Payer: Medicare Other | Admitting: *Deleted

## 2017-12-10 DIAGNOSIS — I4821 Permanent atrial fibrillation: Secondary | ICD-10-CM

## 2017-12-10 DIAGNOSIS — I482 Chronic atrial fibrillation: Secondary | ICD-10-CM | POA: Diagnosis not present

## 2017-12-10 DIAGNOSIS — Z5181 Encounter for therapeutic drug level monitoring: Secondary | ICD-10-CM

## 2017-12-10 LAB — POCT INR: INR: 3.7

## 2017-12-10 NOTE — Patient Instructions (Signed)
Hold coumadin tonight then decrease dose to 1 1/2 tablets daily except 1 tablet on Wednesdays and Sundays Recheck in 1 week

## 2017-12-17 ENCOUNTER — Ambulatory Visit (INDEPENDENT_AMBULATORY_CARE_PROVIDER_SITE_OTHER): Payer: Medicare Other | Admitting: *Deleted

## 2017-12-17 DIAGNOSIS — I4821 Permanent atrial fibrillation: Secondary | ICD-10-CM

## 2017-12-17 DIAGNOSIS — Z5181 Encounter for therapeutic drug level monitoring: Secondary | ICD-10-CM

## 2017-12-17 DIAGNOSIS — I482 Chronic atrial fibrillation: Secondary | ICD-10-CM | POA: Diagnosis not present

## 2017-12-17 LAB — POCT INR: INR: 2.5

## 2017-12-17 NOTE — Patient Instructions (Signed)
Continue coumadin 1 1/2 tablets daily except 1 tablet on Wednesdays and Sundays Restarted clindamyacin for cyst on neck Recheck in 2 weeks

## 2017-12-31 ENCOUNTER — Ambulatory Visit (INDEPENDENT_AMBULATORY_CARE_PROVIDER_SITE_OTHER): Payer: Medicare Other | Admitting: *Deleted

## 2017-12-31 DIAGNOSIS — I482 Chronic atrial fibrillation: Secondary | ICD-10-CM

## 2017-12-31 DIAGNOSIS — I4821 Permanent atrial fibrillation: Secondary | ICD-10-CM

## 2017-12-31 DIAGNOSIS — Z5181 Encounter for therapeutic drug level monitoring: Secondary | ICD-10-CM | POA: Diagnosis not present

## 2017-12-31 LAB — POCT INR: INR: 4.2

## 2017-12-31 NOTE — Patient Instructions (Signed)
Hold coumadin tonight, take 1 tablet tomorrow night then resume 1 1/2 tablets daily except 1 tablet on Wednesdays and Sundays Restarted clindamyacin for cyst on neck Recheck in 2 weeks Be under a lot of stress.  Wife feel and broke hip.

## 2018-01-21 ENCOUNTER — Ambulatory Visit (INDEPENDENT_AMBULATORY_CARE_PROVIDER_SITE_OTHER): Payer: Medicare Other | Admitting: *Deleted

## 2018-01-21 DIAGNOSIS — I482 Chronic atrial fibrillation: Secondary | ICD-10-CM

## 2018-01-21 DIAGNOSIS — I4821 Permanent atrial fibrillation: Secondary | ICD-10-CM

## 2018-01-21 DIAGNOSIS — Z5181 Encounter for therapeutic drug level monitoring: Secondary | ICD-10-CM | POA: Diagnosis not present

## 2018-01-21 LAB — POCT INR
INR: 3.6
INR: 2.5

## 2018-01-21 NOTE — Patient Instructions (Signed)
Continue coumadin 1 1/2 tablets daily except 1 tablet on Wednesdays and Sundays Restarted clindamyacin for cyst on neck Recheck in 3 weeks

## 2018-01-30 ENCOUNTER — Encounter: Payer: Self-pay | Admitting: Vascular Surgery

## 2018-01-30 ENCOUNTER — Other Ambulatory Visit: Payer: Self-pay

## 2018-01-30 ENCOUNTER — Ambulatory Visit (INDEPENDENT_AMBULATORY_CARE_PROVIDER_SITE_OTHER): Payer: Medicare Other | Admitting: Vascular Surgery

## 2018-01-30 VITALS — BP 128/61 | HR 51 | Temp 97.7°F | Resp 20 | Ht 68.0 in | Wt 182.0 lb

## 2018-01-30 DIAGNOSIS — I739 Peripheral vascular disease, unspecified: Secondary | ICD-10-CM | POA: Diagnosis not present

## 2018-01-30 NOTE — Progress Notes (Signed)
Patient name: [name] MRN: [id number] DOB: 1935-11-11 Sex: male  REASON FOR VISIT:   Follow-up of peripheral vascular disease  HPI:   [name] is a pleasant 82 y.o. male who I last saw on 11/28/2017.  This patient had presented with a nonhealing wound of the right leg.  He is status post left above-the-knee amputation he underwent an arteriogram in 2017 which showed an occlusion of the anterior tibial artery.  Otherwise there was no significant infrainguinal arterial occlusive disease.  I felt that the patient had combined peripheral vascular disease and chronic venous insufficiency but felt that he had adequate circulation to heal his wounds based on a toe pressure at that time of 76 mmHg.  I felt that aggressive treatment of his chronic venous insufficiency with elevation and compression would help.  If his wound did not show signs of healing we would need to proceed with arteriography.  He comes in for a 37-monthfollow-up visit.  Since I saw him last, the wounds on the right leg have improved although they have not completely healed.  His wife is been doing dressing changes with Xeroform and dry gauze.  He has been elevating his leg because of his venous insufficiency.  He has minimal pain associated with the wounds.  Past Medical History:  Diagnosis Date  . Arteriosclerotic cardiovascular disease (ASCVD)    CABG in 1990s; 2002 total obstruction of LAD, CX and RCA with patent grafts and nl EF; Stress nuc. 2008 - mild LV dilation; normal EF; questionable small anteroapical scar; no ischemia  . Arthritis    "left knee" (11/08/2015)  . Atrial fibrillation ([place]   . Cellulitis 11/08/2015   "both feet"  . Chronic anticoagulation   . Dental crowns present   . Diabetic peripheral neuropathy (HCC)    bilateral lower leg, hands  . History of blood transfusion    "related to my leg surgery?"  . Hypertension    states under control with meds., has been on med. x "long time"  . Iron  deficiency anemia   . Jaw cancer ([place] 1990s   "squamous cell"  . Myocardial infarction ([place] 1990s   "after my jaw OR"  . Pedal edema    "not a lot", per pt.  . Peripheral vascular disease ([facility]   . Presence of retained hardware 07/2015   failed hardware jaw  . Runny nose 07/27/2015   clear drainage, per pt.  . Type II diabetes mellitus ([place]    "diet controlled" (11/08/2015)    Family History  Problem Relation Age of Onset  . Heart disease Mother     SOCIAL HISTORY: Social History   Tobacco Use  . Smoking status: Never Smoker  . Smokeless tobacco: Never Used  Substance Use Topics  . Alcohol use: No    Alcohol/week: 0.0 oz    Allergies  Allergen Reactions  . Lipitor [Atorvastatin] Other (See Comments)    SEVERE HEADACHE  . Simvastatin Other (See Comments)    MUSCLE ACHES  . Penicillins Rash    Has patient had a PCN reaction causing immediate rash, facial/tongue/throat swelling, SOB or lightheadedness with hypotension: Yes Has patient had a PCN reaction causing severe rash involving mucus membranes or skin necrosis: No Has patient had a PCN reaction that required hospitalization No Has patient had a PCN reaction occurring within the last 10 years: No If all of the above answers are "NO", then may proceed with Cephalosporin use.   . Sulfa Antibiotics  Rash    Tolerates silver sulfadiazine cream at home    Current Outpatient Medications  Medication Sig Dispense Refill  . amLODipine (NORVASC) 10 MG tablet Take 1 tablet (10 mg total) by mouth daily. 90 tablet 3  . aspirin EC 81 MG tablet Take 81 mg by mouth daily.    Marland Kitchen BYSTOLIC 5 MG tablet Take 1 tablet (5 mg total) by mouth daily. 90 tablet 3  . Difluprednate (DUREZOL) 0.05 % EMUL Place 1 drop into both eyes 2 (two) times daily.     Marland Kitchen doxycycline (ADOXA) 100 MG tablet Take 100 mg by mouth 2 (two) times daily.    . feeding supplement (BOOST / RESOURCE BREEZE) LIQD Take 1 Container by mouth 3 (three) times daily  between meals. (Patient taking differently: Take 1 Container by mouth 2 (two) times daily. ) 90 Container 4  . ferrous sulfate 325 (65 FE) MG tablet Take 65 mg by mouth daily with breakfast.    . losartan-hydrochlorothiazide (HYZAAR) 100-12.5 MG tablet Take 1 tablet by mouth daily.    Marland Kitchen lovastatin (MEVACOR) 10 MG tablet Take 10 mg by mouth at bedtime.    . Multiple Vitamins-Minerals (CENTRUM SILVER PO) Take 1 tablet by mouth daily.      Marland Kitchen nystatin cream (MYCOSTATIN) Apply 1 application topically 3 (three) times daily.    . Omega-3 Fatty Acids (FISH OIL) 1200 MG CAPS Take 1,200 mg by mouth daily.    . Probiotic Product (PHILLIPS COLON HEALTH PO) Take 1 capsule by mouth daily.     . silver sulfADIAZINE (SILVADENE) 1 % cream Apply topically daily. (Patient taking differently: Apply 1 application topically daily. Applied to affected area of foot.) 50 g 0  . vitamin B-12 (CYANOCOBALAMIN) 1000 MCG tablet Take 1,000 mcg by mouth 2 (two) times daily. Take at the same time    . warfarin (COUMADIN) 1 MG tablet Take 2 tablets daily or as directed 60 tablet 4   No current facility-administered medications for this visit.     REVIEW OF SYSTEMS:  [X]  denotes positive finding, [ ]  denotes negative finding Cardiac  Comments:  Chest pain or chest pressure:    Shortness of breath upon exertion:    Short of breath when lying flat:    Irregular heart rhythm:        Vascular    Pain in calf, thigh, or hip brought on by ambulation:    Pain in feet at night that wakes you up from your sleep:     Blood clot in your veins:    Leg swelling:         Pulmonary    Oxygen at home:    Productive cough:     Wheezing:         Neurologic    Sudden weakness in arms or legs:     Sudden numbness in arms or legs:     Sudden onset of difficulty speaking or slurred speech:    Temporary loss of vision in one eye:     Problems with dizziness:         Gastrointestinal    Blood in stool:     Vomited blood:           Genitourinary    Burning when urinating:     Blood in urine:        Psychiatric    Major depression:         Hematologic    Bleeding problems:    Problems  with blood clotting too easily:        Skin    Rashes or ulcers:        Constitutional    Fever or chills:     PHYSICAL EXAM:   Vitals:   01/30/18 1532  BP: 128/61  Pulse: (!) 51  Resp: 20  Temp: 97.7 F (36.5 C)  TempSrc: Oral  SpO2: 96%  Weight: 182 lb (82.6 kg)  Height: 5' 8"  (1.727 m)    GENERAL: The patient is a well-nourished male, in no acute distress. The vital signs are documented above. CARDIAC: There is a regular rate and rhythm.  VASCULAR: I do not detect carotid bruits. He has a fairly brisk posterior tibial and dorsalis pedis signal on the right with the Doppler. PULMONARY: There is good air exchange bilaterally without wheezing or rales. ABDOMEN: Soft and non-tender with normal pitched bowel sounds.  MUSCULOSKELETAL: He has an above-the-knee amputation on the left. NEUROLOGIC: No focal weakness or paresthesias are detected. SKIN: There are no ulcers or rashes noted. PSYCHIATRIC: The patient has a normal affect.  DATA:    No new data  MEDICAL ISSUES:   COMBINED ARTERIAL AND VENOUS INSUFFICIENCY: The patient has a toe pressure of 76 mmHg on the right which would suggest adequate circulation for healing of his wounds.  The wounds have continued to gradually improve with elevation and mild compression.  He continues doxycycline.  I think these wounds will continue to gradually heal.  Based on his previous arteriogram his only issue was an occluded anterior tibial artery.  However, if his wounds fail to heal then certainly we could consider arteriography given that his last study was in October 2017.  I will see him back in 6 months.  I ordered ABIs at that time.  He is to call sooner if he has problems.  Deitra Mayo [facility] [phone]

## 2018-02-11 ENCOUNTER — Other Ambulatory Visit (HOSPITAL_COMMUNITY)
Admission: RE | Admit: 2018-02-11 | Discharge: 2018-02-11 | Disposition: A | Discharge status: Home / Self Care | Payer: Medicare Other | Source: Ambulatory Visit | Attending: Cardiovascular Disease | Admitting: Cardiovascular Disease

## 2018-02-11 ENCOUNTER — Ambulatory Visit (INDEPENDENT_AMBULATORY_CARE_PROVIDER_SITE_OTHER): Payer: Medicare Other | Admitting: *Deleted

## 2018-02-11 DIAGNOSIS — I482 Chronic atrial fibrillation: Secondary | ICD-10-CM

## 2018-02-11 DIAGNOSIS — I4821 Permanent atrial fibrillation: Secondary | ICD-10-CM

## 2018-02-11 DIAGNOSIS — Z5181 Encounter for therapeutic drug level monitoring: Secondary | ICD-10-CM

## 2018-02-11 LAB — PROTIME-INR

## 2018-02-11 NOTE — Patient Instructions (Signed)
Discussed with Dr Bronson Ing.  Pt has not had any s/s of bleeding and feels fine.  Will hold coumadin and allow INR to drift down on its own.  Bleeding and fall precautions discussed with pt and wife.  Pt was instructed if he develops and s/s of bleeding he needs to come to the ED immediately.  He verbalized understanding.  Will recheck INR on 02/13/18. Restarted clindamyacin for cyst on neck Recheck in 3 weeks

## 2018-02-13 ENCOUNTER — Ambulatory Visit (INDEPENDENT_AMBULATORY_CARE_PROVIDER_SITE_OTHER): Payer: Medicare Other | Admitting: *Deleted

## 2018-02-13 ENCOUNTER — Other Ambulatory Visit: Payer: Self-pay

## 2018-02-13 ENCOUNTER — Other Ambulatory Visit (HOSPITAL_COMMUNITY)
Admission: RE | Admit: 2018-02-13 | Discharge: 2018-02-13 | Disposition: A | Discharge status: Home / Self Care | Payer: Medicare Other | Source: Ambulatory Visit | Attending: Cardiovascular Disease | Admitting: Cardiovascular Disease

## 2018-02-13 ENCOUNTER — Inpatient Hospital Stay (HOSPITAL_COMMUNITY)
Admission: EM | Admit: 2018-02-13 | Discharge: 2018-02-15 | DRG: 378 | Disposition: A | Discharge status: Home / Self Care | Payer: Medicare Other | Attending: Internal Medicine | Admitting: Internal Medicine

## 2018-02-13 ENCOUNTER — Encounter (HOSPITAL_COMMUNITY): Payer: Self-pay | Admitting: Emergency Medicine

## 2018-02-13 DIAGNOSIS — I2510 Atherosclerotic heart disease of native coronary artery without angina pectoris: Secondary | ICD-10-CM | POA: Diagnosis present

## 2018-02-13 DIAGNOSIS — D689 Coagulation defect, unspecified: Secondary | ICD-10-CM | POA: Diagnosis present

## 2018-02-13 DIAGNOSIS — Z5181 Encounter for therapeutic drug level monitoring: Secondary | ICD-10-CM | POA: Diagnosis not present

## 2018-02-13 DIAGNOSIS — E869 Volume depletion, unspecified: Secondary | ICD-10-CM | POA: Diagnosis present

## 2018-02-13 DIAGNOSIS — K2980 Duodenitis without bleeding: Secondary | ICD-10-CM | POA: Diagnosis present

## 2018-02-13 DIAGNOSIS — I4821 Permanent atrial fibrillation: Secondary | ICD-10-CM | POA: Diagnosis present

## 2018-02-13 DIAGNOSIS — Z89612 Acquired absence of left leg above knee: Secondary | ICD-10-CM

## 2018-02-13 DIAGNOSIS — I482 Chronic atrial fibrillation: Secondary | ICD-10-CM | POA: Diagnosis not present

## 2018-02-13 DIAGNOSIS — K922 Gastrointestinal hemorrhage, unspecified: Secondary | ICD-10-CM

## 2018-02-13 DIAGNOSIS — I25810 Atherosclerosis of coronary artery bypass graft(s) without angina pectoris: Secondary | ICD-10-CM | POA: Diagnosis not present

## 2018-02-13 DIAGNOSIS — I739 Peripheral vascular disease, unspecified: Secondary | ICD-10-CM

## 2018-02-13 DIAGNOSIS — K921 Melena: Secondary | ICD-10-CM | POA: Diagnosis not present

## 2018-02-13 DIAGNOSIS — E1151 Type 2 diabetes mellitus with diabetic peripheral angiopathy without gangrene: Secondary | ICD-10-CM | POA: Diagnosis present

## 2018-02-13 DIAGNOSIS — Z7901 Long term (current) use of anticoagulants: Secondary | ICD-10-CM | POA: Diagnosis not present

## 2018-02-13 DIAGNOSIS — I129 Hypertensive chronic kidney disease with stage 1 through stage 4 chronic kidney disease, or unspecified chronic kidney disease: Secondary | ICD-10-CM | POA: Diagnosis present

## 2018-02-13 DIAGNOSIS — Z7982 Long term (current) use of aspirin: Secondary | ICD-10-CM

## 2018-02-13 DIAGNOSIS — E1142 Type 2 diabetes mellitus with diabetic polyneuropathy: Secondary | ICD-10-CM | POA: Diagnosis present

## 2018-02-13 DIAGNOSIS — K2960 Other gastritis without bleeding: Secondary | ICD-10-CM | POA: Diagnosis present

## 2018-02-13 DIAGNOSIS — N183 Chronic kidney disease, stage 3 unspecified: Secondary | ICD-10-CM

## 2018-02-13 DIAGNOSIS — Z951 Presence of aortocoronary bypass graft: Secondary | ICD-10-CM

## 2018-02-13 DIAGNOSIS — K319 Disease of stomach and duodenum, unspecified: Secondary | ICD-10-CM | POA: Diagnosis present

## 2018-02-13 DIAGNOSIS — Z79899 Other long term (current) drug therapy: Secondary | ICD-10-CM

## 2018-02-13 DIAGNOSIS — N179 Acute kidney failure, unspecified: Secondary | ICD-10-CM | POA: Diagnosis present

## 2018-02-13 DIAGNOSIS — I10 Essential (primary) hypertension: Secondary | ICD-10-CM

## 2018-02-13 DIAGNOSIS — D62 Acute posthemorrhagic anemia: Secondary | ICD-10-CM | POA: Diagnosis present

## 2018-02-13 DIAGNOSIS — E785 Hyperlipidemia, unspecified: Secondary | ICD-10-CM | POA: Diagnosis present

## 2018-02-13 DIAGNOSIS — E1122 Type 2 diabetes mellitus with diabetic chronic kidney disease: Secondary | ICD-10-CM | POA: Diagnosis present

## 2018-02-13 DIAGNOSIS — N189 Chronic kidney disease, unspecified: Secondary | ICD-10-CM

## 2018-02-13 DIAGNOSIS — I252 Old myocardial infarction: Secondary | ICD-10-CM

## 2018-02-13 LAB — BASIC METABOLIC PANEL
Anion gap: 10 (ref 5–15)
BUN: 63 mg/dL — AB (ref 6–20)
CALCIUM: 7.5 mg/dL — AB (ref 8.9–10.3)
CO2: 13 mmol/L — AB (ref 22–32)
Chloride: 113 mmol/L — ABNORMAL HIGH (ref 101–111)
Creatinine, Ser: 1.47 mg/dL — ABNORMAL HIGH (ref 0.61–1.24)
GFR, EST AFRICAN AMERICAN: 51 mL/min — AB (ref >60)
GFR, EST NON AFRICAN AMERICAN: 44 mL/min — AB (ref >60)
Glucose, Bld: 122 mg/dL — ABNORMAL HIGH (ref 65–99)
POTASSIUM: 3.5 mmol/L (ref 3.5–5.1)
Sodium: 136 mmol/L (ref 135–145)

## 2018-02-13 LAB — CBC
HCT: 22.6 % — ABNORMAL LOW (ref 39.0–52.0)
Hemoglobin: 7.5 g/dL — ABNORMAL LOW (ref 13.0–17.0)
MCH: 29.3 pg (ref 26.0–34.0)
MCHC: 33.2 g/dL (ref 30.0–36.0)
MCV: 88.3 fL (ref 78.0–100.0)
Platelets: 297 10*3/uL (ref 150–400)
RBC: 2.56 MIL/uL — ABNORMAL LOW (ref 4.22–5.81)
RDW: 14.8 % (ref 11.5–15.5)
WBC: 10.2 10*3/uL (ref 4.0–10.5)

## 2018-02-13 LAB — I-STAT CHEM 8, ED
BUN: 61 mg/dL — ABNORMAL HIGH (ref 6–20)
CALCIUM ION: 1.14 mmol/L — AB (ref 1.15–1.40)
Chloride: 112 mmol/L — ABNORMAL HIGH (ref 101–111)
Creatinine, Ser: 1.5 mg/dL — ABNORMAL HIGH (ref 0.61–1.24)
GLUCOSE: 117 mg/dL — AB (ref 65–99)
HCT: 23 % — ABNORMAL LOW (ref 39.0–52.0)
HEMOGLOBIN: 7.8 g/dL — AB (ref 13.0–17.0)
Potassium: 4 mmol/L (ref 3.5–5.1)
SODIUM: 141 mmol/L (ref 135–145)
TCO2: 14 mmol/L — ABNORMAL LOW (ref 22–32)

## 2018-02-13 LAB — POC OCCULT BLOOD, ED: FECAL OCCULT BLD: POSITIVE — AB

## 2018-02-13 LAB — PREPARE RBC (CROSSMATCH)

## 2018-02-13 LAB — PROTIME-INR
INR: 1.26
PROTHROMBIN TIME: 15.7 s — AB (ref 11.4–15.2)

## 2018-02-13 LAB — ABO/RH: ABO/RH(D): A POS

## 2018-02-13 LAB — POCT INR: INR: >8

## 2018-02-13 MED ORDER — NEBIVOLOL HCL 10 MG PO TABS
10.0000 mg | ORAL_TABLET | Freq: Every day | ORAL | Status: DC
  Administered 2018-02-14 – 2018-02-15 (×2): 10 mg via ORAL
  Filled 2018-02-13 (×3): qty 1

## 2018-02-13 MED ORDER — DEXTROSE 5 % IV SOLN
10.0000 mg | INTRAVENOUS | Status: AC
  Administered 2018-02-13: 10 mg via INTRAVENOUS
  Filled 2018-02-13: qty 1

## 2018-02-13 MED ORDER — VITAMIN B-12 1000 MCG PO TABS
2000.0000 ug | ORAL_TABLET | Freq: Every morning | ORAL | Status: DC
  Administered 2018-02-13 – 2018-02-15 (×3): 2000 ug via ORAL
  Filled 2018-02-13 (×3): qty 2

## 2018-02-13 MED ORDER — PROTHROMBIN COMPLEX CONC HUMAN 500 UNITS IV KIT
3836.0000 [IU] | PACK | Status: AC
  Administered 2018-02-13: 3836 [IU] via INTRAVENOUS
  Filled 2018-02-13: qty 3836

## 2018-02-13 MED ORDER — SODIUM CHLORIDE 0.9 % IV SOLN
8.0000 mg/h | INTRAVENOUS | Status: DC
  Administered 2018-02-13 – 2018-02-15 (×4): 8 mg/h via INTRAVENOUS
  Filled 2018-02-13 (×8): qty 80

## 2018-02-13 MED ORDER — ACETAMINOPHEN 650 MG RE SUPP: 650.0000 mg | Freq: Four times a day (QID) | RECTAL | Status: DC | PRN

## 2018-02-13 MED ORDER — SODIUM CHLORIDE 0.9 % IV SOLN
Freq: Once | INTRAVENOUS | Status: AC
  Administered 2018-02-13: 14:00:00 via INTRAVENOUS

## 2018-02-13 MED ORDER — BOOST / RESOURCE BREEZE PO LIQD CUSTOM
1.0000 | Freq: Two times a day (BID) | ORAL | Status: DC
  Administered 2018-02-13 – 2018-02-15 (×2): 1 via ORAL

## 2018-02-13 MED ORDER — SODIUM CHLORIDE 0.9 % IV BOLUS
1000.0000 mL | Freq: Once | INTRAVENOUS | Status: AC
  Administered 2018-02-13: 1000 mL via INTRAVENOUS

## 2018-02-13 MED ORDER — PRAVASTATIN SODIUM 10 MG PO TABS: 10.0000 mg | ORAL_TABLET | Freq: Every day | ORAL | Status: DC

## 2018-02-13 MED ORDER — ONDANSETRON HCL 4 MG/2ML IJ SOLN: 4.0000 mg | Freq: Four times a day (QID) | INTRAMUSCULAR | Status: DC | PRN

## 2018-02-13 MED ORDER — LOVASTATIN 10 MG PO TABS
10.0000 mg | ORAL_TABLET | Freq: Every morning | ORAL | Status: DC
  Administered 2018-02-14 – 2018-02-15 (×2): 10 mg via ORAL

## 2018-02-13 MED ORDER — ACETAMINOPHEN 325 MG PO TABS: 650.0000 mg | ORAL_TABLET | Freq: Four times a day (QID) | ORAL | Status: DC | PRN

## 2018-02-13 MED ORDER — DIFLUPREDNATE 0.05 % OP EMUL
1.0000 [drp] | Freq: Two times a day (BID) | OPHTHALMIC | Status: DC
  Administered 2018-02-13 – 2018-02-15 (×4): 1 [drp] via OPHTHALMIC
  Filled 2018-02-13: qty 5

## 2018-02-13 MED ORDER — SILVER SULFADIAZINE 1 % EX CREA
1.0000 "application " | TOPICAL_CREAM | Freq: Every day | CUTANEOUS | Status: DC
  Administered 2018-02-14: 1 via TOPICAL
  Filled 2018-02-13: qty 50

## 2018-02-13 MED ORDER — POTASSIUM CHLORIDE IN NACL 20-0.9 MEQ/L-% IV SOLN
INTRAVENOUS | Status: DC
  Administered 2018-02-13 – 2018-02-14 (×2): via INTRAVENOUS

## 2018-02-13 MED ORDER — ONDANSETRON HCL 4 MG PO TABS: 4.0000 mg | ORAL_TABLET | Freq: Four times a day (QID) | ORAL | Status: DC | PRN

## 2018-02-13 MED ORDER — OMEGA-3-ACID ETHYL ESTERS 1 G PO CAPS
1.0000 g | ORAL_CAPSULE | Freq: Every day | ORAL | Status: DC
  Administered 2018-02-14 – 2018-02-15 (×2): 1 g via ORAL
  Filled 2018-02-13 (×2): qty 1

## 2018-02-13 MED ORDER — SODIUM CHLORIDE 0.9 % IV SOLN
80.0000 mg | Freq: Once | INTRAVENOUS | Status: AC
  Administered 2018-02-13: 80 mg via INTRAVENOUS
  Filled 2018-02-13: qty 80

## 2018-02-13 MED ORDER — FERROUS SULFATE 325 (65 FE) MG PO TABS
325.0000 mg | ORAL_TABLET | Freq: Every day | ORAL | Status: DC
  Administered 2018-02-14 – 2018-02-15 (×2): 325 mg via ORAL
  Filled 2018-02-13 (×2): qty 1

## 2018-02-13 NOTE — ED Provider Notes (Signed)
[facility] EMERGENCY DEPARTMENT Provider Note   CSN: [id number] Arrival date & time: 02/13/18  1052     History   Chief Complaint Chief Complaint  Patient presents with  . GI Bleeding    HPI [name] is a 82 y.o. male.  Patient states that he started having black stools 3 days ago.  He stopped his Coumadin.  He also states that he has felt really tired and last night his blood pressure was less than 175 systolic so he did not take his blood pressure medicine.  Patient had blood drawn this morning and his cardiologist told to come to the hospital for treatment  The history is provided by the patient. No language interpreter was used.  Weakness  Primary symptoms include no focal weakness. This is a new problem. The current episode started 2 days ago. The problem has not changed since onset.There was no focality noted. There has been no fever. Pertinent negatives include no shortness of breath, no chest pain and no headaches. There were no medications administered prior to arrival. Associated medical issues do not include trauma.    Past Medical History:  Diagnosis Date  . Arteriosclerotic cardiovascular disease (ASCVD)    CABG in 1990s; 2002 total obstruction of LAD, CX and RCA with patent grafts and nl EF; Stress nuc. 2008 - mild LV dilation; normal EF; questionable small anteroapical scar; no ischemia  . Arthritis    "left knee" (11/08/2015)  . Atrial fibrillation ([place])   . Cellulitis 11/08/2015   "both feet"  . Chronic anticoagulation   . Dental crowns present   . Diabetic peripheral neuropathy (HCC)    bilateral lower leg, hands  . History of blood transfusion    "related to my leg surgery?"  . Hypertension    states under control with meds., has been on med. x "long time"  . Iron deficiency anemia   . Jaw cancer ([place]) 1990s   "squamous cell"  . Myocardial infarction ([place]) 1990s   "after my jaw OR"  . Pedal edema    "not a lot", per pt.  . Peripheral vascular  disease ([place])   . Presence of retained hardware 07/2015   failed hardware jaw  . Runny nose 07/27/2015   clear drainage, per pt.  . Type II diabetes mellitus ([place])    "diet controlled" (11/08/2015)    Patient Active Problem List   Diagnosis Date Noted  . Weight gain 11/30/2015  . Coronary artery disease involving coronary bypass graft of native heart without angina pectoris   . Tachycardia   . Leukocytosis   . Abnormality of gait   . Status post above knee amputation of left lower extremity ([place])   . Acute blood loss anemia   . Acute pulmonary edema (HCC)   . Acute congestive heart failure ([place])   . Acute respiratory failure with hypoxia ([place])   . Pressure ulcer 11/09/2015  . Necrotic toes ([place]) 11/08/2015  . Lower extremity cellulitis 11/08/2015  . Bradycardia 05/28/2015  . PVD (peripheral vascular disease) with claudication ([place]) 09/30/2014  . Encounter for therapeutic drug monitoring 11/26/2013  . Peripheral vascular disease ([place]) 08/06/2012  . Chronic anticoagulation 01/20/2011  . Diabetes mellitus ([place]) 03/04/2010  . Dyslipidemia 03/04/2010  . Essential hypertension 03/04/2010  . Hx of CABG 03/04/2010  . Permanent atrial fibrillation ([place]) 04/08/2009    Past Surgical History:  Procedure Laterality Date  . AMPUTATION Left 11/11/2015   Procedure: LEFT ABOVE KNEE AMPUTATION;  Surgeon: Judeth Cornfield  Scot Dock, MD;  Location: [place];  Service: Vascular;  Laterality: Left;  . CARDIAC CATHETERIZATION  1990s; 04/16/2001  . CATARACT EXTRACTION W/ INTRAOCULAR LENS  IMPLANT, BILATERAL Bilateral 09/2015  . COLONOSCOPY N/A 11/27/2014   Procedure: COLONOSCOPY;  Surgeon: Rogene Houston, MD;  [facility];  Service: Endoscopy;  Laterality: N/A;  830  . CORONARY ARTERY BYPASS GRAFT  1990's   "CABG X4"  . FRACTURE SURGERY    . I&D EXTREMITY Left 11/10/2015   Procedure: INCISION AND DRAINAGE LEFT FOOT, AMPUTATION OF LEFT THIRD TOE;  Surgeon: Conrad Sierra Brooks, MD;  Location: [place];   Service: Vascular;  Laterality: Left;  . INCISION AND DRAINAGE ABSCESS Left 06/16/2005   wide exc. abscess 5th toe  . MANDIBULAR HARDWARE REMOVAL Left 08/02/2015   Procedure:  HARDWARE REMOVAL TWO MANDIBULAR SCREWS;  Surgeon: Jannette Fogo, DDS;  Location: [place];  Service: Oral Surgery;  Laterality: Left;  . ORIF TIBIA FRACTURE Left ~ 1970  . PERIPHERAL VASCULAR CATHETERIZATION N/A 08/14/2016   Procedure: Abdominal Aortogram w/Lower Extremity;  Surgeon: Angelia Mould, MD;  Location: [place] [facility];  Service: Cardiovascular;  Laterality: N/A;  . SQUAMOUS CELL CARCINOMA EXCISION Left 1993   "took my jaw out; got cadavar in there now"  . TONSILLECTOMY          Home Medications    Prior to Admission medications   Medication Sig Start Date End Date Taking? Authorizing Provider  amLODipine (NORVASC) 10 MG tablet Take 1 tablet (10 mg total) by mouth daily. 04/02/17  Yes Herminio Commons, MD  aspirin EC 81 MG tablet Take 81 mg by mouth daily.   Yes [provider]  Difluprednate (DUREZOL) 0.05 % EMUL Place 1 drop into both eyes 2 (two) times daily.    Yes [provider]  ferrous sulfate 325 (65 FE) MG tablet Take 65 mg by mouth daily with breakfast.   Yes [provider]  losartan-hydrochlorothiazide (HYZAAR) 100-12.5 MG tablet Take 1 tablet by mouth daily.   Yes [provider]  lovastatin (MEVACOR) 10 MG tablet Take 10 mg by mouth daily.    Yes [provider]  Multiple Vitamins-Minerals (CENTRUM SILVER PO) Take 1 tablet by mouth daily.     Yes [provider]  nebivolol (BYSTOLIC) 10 MG tablet Take 10 mg by mouth daily.   Yes [provider]  nystatin cream (MYCOSTATIN) Apply 1 application topically 3 (three) times daily.   Yes [provider]  Omega-3 Fatty Acids (FISH OIL) 1200 MG CAPS Take 1,200 mg by mouth daily.   Yes [provider]  Probiotic Product (Justice) Take 1 capsule by mouth daily.    Yes [provider]  silver sulfADIAZINE (SILVADENE) 1 % cream Apply topically daily. Patient taking differently: Apply 1 application topically daily. Applied to affected area of foot. 11/16/15  Yes Loleta Chance, MD  vitamin B-12 (CYANOCOBALAMIN) 1000 MCG tablet Take 2,000 mcg by mouth every morning.   Yes [provider]  feeding supplement (BOOST / RESOURCE BREEZE) LIQD Take 1 Container by mouth 3 (three) times daily between meals. Patient taking differently: Take 1 Container by mouth 2 (two) times daily.  11/16/15   Loleta Chance, MD  warfarin (COUMADIN) 1 MG tablet Take 2 tablets daily or as directed Patient not taking: Reported on 02/13/2018 08/22/17   Herminio Commons, MD    Family History Family History  Problem Relation Age of Onset  .  Heart disease Mother     Social History Social History   Tobacco Use  . Smoking status: Never Smoker  . Smokeless tobacco: Never Used  Substance Use Topics  . Alcohol use: No    Alcohol/week: 0.0 oz  . Drug use: No     Allergies   Lipitor [atorvastatin]; Simvastatin; Penicillins; and Sulfa antibiotics   Review of Systems Review of Systems  Constitutional: Negative for appetite change and fatigue.  HENT: Negative for congestion, ear discharge and sinus pressure.   Eyes: Negative for discharge.  Respiratory: Negative for cough and shortness of breath.   Cardiovascular: Negative for chest pain.  Gastrointestinal: Negative for abdominal pain and diarrhea.       Black stools  Genitourinary: Negative for frequency and hematuria.  Musculoskeletal: Negative for back pain.  Skin: Negative for rash.  Neurological: Positive for weakness. Negative for focal weakness, seizures and headaches.  Psychiatric/Behavioral: Negative for hallucinations.     Physical Exam Updated Vital Signs BP (!) 117/56   Pulse (!) 49   Temp 97.9 F (36.6 C) (Oral)   Resp 16   Ht 5' 8"  (1.727 m)   Wt  77.1 kg (170 lb)   SpO2 100%   BMI 25.85 kg/m   Physical Exam  Constitutional: He is oriented to person, place, and time. He appears well-developed.  HENT:  Head: Normocephalic.  Eyes: Conjunctivae and EOM are normal. No scleral icterus.  Neck: Neck supple. No thyromegaly present.  Cardiovascular: Normal rate. Exam reveals no gallop and no friction rub.  No murmur heard. Mild irregularity of heart rhythm  Pulmonary/Chest: No stridor. He has no wheezes. He has no rales. He exhibits no tenderness.  Abdominal: He exhibits no distension. There is no tenderness. There is no rebound.  Genitourinary:  Genitourinary Comments: Heme positive black stool  Musculoskeletal: Normal range of motion. He exhibits no edema.  Lymphadenopathy:    He has no cervical adenopathy.  Neurological: He is oriented to person, place, and time. He exhibits normal muscle tone. Coordination normal.  Skin: No rash noted. No erythema.  Psychiatric: He has a normal mood and affect. His behavior is normal.     ED Treatments / Results  Labs (all labs ordered are listed, but only abnormal results are displayed) Labs Reviewed  I-STAT CHEM 8, ED - Abnormal; Notable for the following components:      Result Value   Chloride 112 (*)    BUN 61 (*)    Creatinine, Ser 1.50 (*)    Glucose, Bld 117 (*)    Calcium, Ion 1.14 (*)    TCO2 14 (*)    Hemoglobin 7.8 (*)    HCT 23.0 (*)    All other components within normal limits  POC OCCULT BLOOD, ED - Abnormal; Notable for the following components:   Fecal Occult Bld POSITIVE (*)    All other components within normal limits  PROTIME-INR  PROTIME-INR  TYPE AND SCREEN  PREPARE RBC (CROSSMATCH)  ABO/RH    EKG None  Radiology No results found.  Procedures Procedures (including critical care time)  Medications Ordered in ED Medications  prothrombin complex conc human (KCENTRA) IVPB 3,836 Units (has no administration in time range)  phytonadione (VITAMIN K) 10  mg in dextrose 5 % 50 mL IVPB (10 mg Intravenous New Bag/Given 02/13/18 1156)  sodium chloride 0.9 % bolus 1,000 mL (1,000 mLs Intravenous New Bag/Given 02/13/18 1155)  0.9 %  sodium chloride infusion (has no administration in time range)  pantoprazole (PROTONIX) 80 mg in sodium chloride 0.9 % 100 mL IVPB (has no administration in time range)  pantoprazole (PROTONIX) 80 mg in sodium chloride 0.9 % 250 mL (0.32 mg/mL) infusion (has no administration in time range)   CRITICAL CARE Performed by: Milton Ferguson Total critical care time: 40 minutes Critical care time was exclusive of separately billable procedures and treating other patients. Critical care was necessary to treat or prevent imminent or life-threatening deterioration. Critical care was time spent personally by me on the following activities: development of treatment plan with patient and/or surrogate as well as nursing, discussions with consultants, evaluation of patient's response to treatment, examination of patient, obtaining history from patient or surrogate, ordering and performing treatments and interventions, ordering and review of laboratory studies, ordering and review of radiographic studies, pulse oximetry and re-evaluation of patient's condition.  Patient with upper GI bleed.  Patient was started on [place] and arrangement was made for transfusion he is also started on a Protonix drip.  Hospitalist will admit and GI has been consulted Initial Impression / Assessment and Plan / ED Course  I have reviewed the triage vital signs and the nursing notes.  Pertinent labs & imaging results that were available during my care of the patient were reviewed by me and considered in my medical decision making (see chart for details).      Final Clinical Impressions(s) / ED Diagnoses   Final diagnoses:  None    ED Discharge Orders    None       Milton Ferguson, MD 02/13/18 1217

## 2018-02-13 NOTE — ED Notes (Signed)
RN Nira Conn called lab about bringing up anticoagulants for this patient.

## 2018-02-13 NOTE — ED Notes (Signed)
Pt's bag was punctured when trying to start blood. Have contacted Lab. Pt did NOT get this unit of blood. Will go get another unit from lab.

## 2018-02-13 NOTE — ED Triage Notes (Signed)
Pt sent by Cardiologist for s/s of weakness, low hemoglobin and dark stools x 3 days.  Pt states stopped coumadin 2 days ago.  Hemoglobin 7.5.

## 2018-02-13 NOTE — H&P (Signed)
History and Physical  KHANG HANNUM QQP:[id number] DOB: [birth year] DOA: 02/13/2018   PCP: Lemmie Evens, MD   Patient coming from: Home  Chief Complaint: Melena, generalized weakness  HPI:  [name] is a 82 y.o. male with medical history of atrial fibrillation, diet-controlled diabetes mellitus, hypertension, peripheral vascular disease, coronary artery disease, hyperlipidemia presenting with 3-day history of melanotic stools and generalized weakness.  The patient had his INR checked on 02/11/2018, it was noted to be greater than 10.  He was told to stop his warfarin.  He had his INR rechecked on 02/13/2018.  Again, it was noted to be greater than 10.  As result, the patient was called by the cardiology office to go to the emergency department for further evaluation.  The patient denied any dizziness, chest pain, shortness breath, nausea, vomiting, diarrhea.  He has been having melanotic stools without hematochezia or melena.  There is no hematuria.  He does complain of an intermittent nosebleed in the last 2 to 3 days.  There is no hematemesis. The patient denies any new medications.  The patient has been on doxycycline for the better part of one year, but he has stopped 2 weeks prior to this admission.  He denies any NSAIDs over-the-counter.  He states that his appetite has been as good as usual, but wife states that it has been poor for the past week. In the emergency department, the patient was afebrile hemodynamically stable saturating 100% room air.  Hemoglobin was 7.5.  Serum creatinine was 1.47.  INR was greater than 10.  Hemoccult was positive with melanotic stool.  The patient was given vitamin K 10 mg IV and Kcentra.  Assessment/Plan: Acute Blood Loss Anemia/Symptomatic Anemia -GI consulted -2 units PRBC ordered by ED -start PPI  Coagulopathy -Etiology unclear, suspect poor nutrition -Patient stopped doxycycline 2 weeks prior to this admission -Kcentra and vitamin K  given -Recheck INR in the morning -Holding Coumadin  Chronic atrial fibrillation -Holding Coumadin secondary to coagulopathy and GI bleed -Continue nebivolol  Essential hypertension -Continue nebivolol -Holding amlodipine  Acute on chronic renal failure--CKD stage III -Baseline creatinine 0.8-1.1 -Holding losartan/HCTZ -presenting creatinine 1.47 -Secondary to volume depletion -Transfuse PRBC  Hyperlipidemia -Continue statin  Coronary artery disease -No chest pain presently -Holding aspirin secondary to GI bleed -Continue statin  Peripheral vascular disease with chronic wound -Continue daily dressing changes      Past Medical History:  Diagnosis Date  . Arteriosclerotic cardiovascular disease (ASCVD)    CABG in 1990s; 2002 total obstruction of LAD, CX and RCA with patent grafts and nl EF; Stress nuc. 2008 - mild LV dilation; normal EF; questionable small anteroapical scar; no ischemia  . Arthritis    "left knee" (11/08/2015)  . Atrial fibrillation ([place])   . Cellulitis 11/08/2015   "both feet"  . Chronic anticoagulation   . Dental crowns present   . Diabetic peripheral neuropathy (HCC)    bilateral lower leg, hands  . History of blood transfusion    "related to my leg surgery?"  . Hypertension    states under control with meds., has been on med. x "long time"  . Iron deficiency anemia   . Jaw cancer ([place]) 1990s   "squamous cell"  . Myocardial infarction ([place]) 1990s   "after my jaw OR"  . Pedal edema    "not a lot", per pt.  . Peripheral vascular disease ([place])   . Presence of retained hardware 07/2015   failed  hardware jaw  . Runny nose 07/27/2015   clear drainage, per pt.  . Type II diabetes mellitus ([place])    "diet controlled" (11/08/2015)   Past Surgical History:  Procedure Laterality Date  . AMPUTATION Left 11/11/2015   Procedure: LEFT ABOVE KNEE AMPUTATION;  Surgeon: Angelia Mould, MD;  Location: [facility];  Service: Vascular;  Laterality: Left;    . CARDIAC CATHETERIZATION  1990s; 04/16/2001  . CATARACT EXTRACTION W/ INTRAOCULAR LENS  IMPLANT, BILATERAL Bilateral 09/2015  . COLONOSCOPY N/A 11/27/2014   Procedure: COLONOSCOPY;  Surgeon: Rogene Houston, MD;  [facility];  Service: Endoscopy;  Laterality: N/A;  830  . CORONARY ARTERY BYPASS GRAFT  1990's   "CABG X4"  . FRACTURE SURGERY    . I&D EXTREMITY Left 11/10/2015   Procedure: INCISION AND DRAINAGE LEFT FOOT, AMPUTATION OF LEFT THIRD TOE;  Surgeon: Conrad Dyersville, MD;  Location: [facility];  Service: Vascular;  Laterality: Left;  . INCISION AND DRAINAGE ABSCESS Left 06/16/2005   wide exc. abscess 5th toe  . MANDIBULAR HARDWARE REMOVAL Left 08/02/2015   Procedure:  HARDWARE REMOVAL TWO MANDIBULAR SCREWS;  Surgeon: Jannette Fogo, DDS;  Location: [facility];  Service: Oral Surgery;  Laterality: Left;  . ORIF TIBIA FRACTURE Left ~ 1970  . PERIPHERAL VASCULAR CATHETERIZATION N/A 08/14/2016   Procedure: Abdominal Aortogram w/Lower Extremity;  Surgeon: Angelia Mould, MD;  Location: [facility];  Service: Cardiovascular;  Laterality: N/A;  . SQUAMOUS CELL CARCINOMA EXCISION Left 1993   "took my jaw out; got cadavar in there now"  . TONSILLECTOMY     Social History:  reports that he has never smoked. He has never used smokeless tobacco. He reports that he does not drink alcohol or use drugs.   Family History  Problem Relation Age of Onset  . Heart disease Mother      Allergies  Allergen Reactions  . Lipitor [Atorvastatin] Other (See Comments)    SEVERE HEADACHE  . Simvastatin Other (See Comments)    MUSCLE ACHES  . Penicillins Rash    Has patient had a PCN reaction causing immediate rash, facial/tongue/throat swelling, SOB or lightheadedness with hypotension: Yes Has patient had a PCN reaction causing severe rash involving mucus membranes or skin necrosis: No Has patient had a PCN reaction that required hospitalization No Has patient had a PCN  reaction occurring within the last 10 years: No If all of the above answers are "NO", then may proceed with Cephalosporin use.   . Sulfa Antibiotics Rash    Tolerates silver sulfadiazine cream at home     Prior to Admission medications   Medication Sig Start Date End Date Taking? Authorizing Provider  amLODipine (NORVASC) 10 MG tablet Take 1 tablet (10 mg total) by mouth daily. 04/02/17  Yes Herminio Commons, MD  aspirin EC 81 MG tablet Take 81 mg by mouth daily.   Yes [provider]  Difluprednate (DUREZOL) 0.05 % EMUL Place 1 drop into both eyes 2 (two) times daily.    Yes [provider]  ferrous sulfate 325 (65 FE) MG tablet Take 65 mg by mouth daily with breakfast.   Yes [provider]  losartan-hydrochlorothiazide (HYZAAR) 100-12.5 MG tablet Take 1 tablet by mouth daily.   Yes [provider]  lovastatin (MEVACOR) 10 MG tablet Take 10 mg by mouth daily.    Yes [provider]  Multiple Vitamins-Minerals (CENTRUM SILVER PO) Take 1 tablet by mouth daily.  Yes [provider]  nebivolol (BYSTOLIC) 10 MG tablet Take 10 mg by mouth daily.   Yes [provider]  nystatin cream (MYCOSTATIN) Apply 1 application topically 3 (three) times daily.   Yes [provider]  Omega-3 Fatty Acids (FISH OIL) 1200 MG CAPS Take 1,200 mg by mouth daily.   Yes [provider]  Probiotic Product ([place]) Take 1 capsule by mouth daily.    Yes [provider]  silver sulfADIAZINE (SILVADENE) 1 % cream Apply topically daily. Patient taking differently: Apply 1 application topically daily. Applied to affected area of foot. 11/16/15  Yes Loleta Chance, MD  vitamin B-12 (CYANOCOBALAMIN) 1000 MCG tablet Take 2,000 mcg by mouth every morning.   Yes [provider]  feeding supplement (BOOST / RESOURCE BREEZE) LIQD Take 1 Container by mouth 3 (three) times daily between meals. Patient taking  differently: Take 1 Container by mouth 2 (two) times daily.  11/16/15   Loleta Chance, MD  warfarin (COUMADIN) 1 MG tablet Take 2 tablets daily or as directed Patient not taking: Reported on 02/13/2018 08/22/17   Herminio Commons, MD    Review of Systems:  Constitutional:  No weight loss, night sweats, Fevers, chills  Head&Eyes: No headache.  No vision loss.  No eye pain or scotoma ENT:  No Difficulty swallowing,Tooth/dental problems,Sore throat,  No ear ache, post nasal drip,  Cardio-vascular:  No chest pain, Orthopnea, PND, swelling in lower extremities,  dizziness, palpitations  GI:  No  abdominal pain, nausea, vomiting, diarrhea, loss of appetite, hematochezia,  heartburn, indigestion, Resp:  No shortness of breath with exertion or at rest. No cough. No coughing up of blood .No wheezing.No chest wall deformity  Skin:  no rash or lesions.  GU:  no dysuria, change in color of urine, no urgency or frequency. No flank pain.  Musculoskeletal:  No joint pain or swelling. No decreased range of motion. No back pain.  Psych:  No change in mood or affect. No depression or anxiety. Neurologic: No headache, no dysesthesia, no focal weakness, no vision loss. No syncope  Physical Exam: Vitals:   02/13/18 1059 02/13/18 1106 02/13/18 1130 02/13/18 1200  BP:  (!) 149/58 114/67 (!) 117/56  Pulse:   63 (!) 49  Resp:   14 16  Temp:      TempSrc:      SpO2:   100% 100%  Weight: 77.1 kg (170 lb)     Height: 5' 8"  (1.727 m)      General:  A&O x 3, NAD, nontoxic, pleasant/cooperative Head/Eye: No conjunctival hemorrhage, no icterus, [place]/AT, No nystagmus ENT:  No icterus,  No thrush, good dentition, no pharyngeal exudate Neck:  No masses, no lymphadenpathy, no bruits CV:  RRR, no rub, no gallop, no S3 Lung:  CTAB, good air movement, no wheeze, no rhonchi Abdomen: soft/NT, +BS, nondistended, no peritoneal signs Ext: No cyanosis, No rashes, No petechiae, No lymphangitis, No edema Neuro:  CNII-XII intact, strength 4/5 in bilateral upper and lower extremities, no dysmetria  Labs on Admission:  Basic Metabolic Panel: Recent Labs  Lab 02/13/18 0850 02/13/18 1125  NA 136 141  K 3.5 4.0  CL 113* 112*  CO2 13*  --   GLUCOSE 122* 117*  BUN 63* 61*  CREATININE 1.47* 1.50*  CALCIUM 7.5*  --    Liver Function Tests: No results for input(s): AST, ALT, ALKPHOS, BILITOT, PROT, ALBUMIN in the last 168 hours. No results for input(s): LIPASE, AMYLASE  in the last 168 hours. No results for input(s): AMMONIA in the last 168 hours. CBC: Recent Labs  Lab 02/13/18 0850 02/13/18 1125  WBC 10.2  --   HGB 7.5* 7.8*  HCT 22.6* 23.0*  MCV 88.3  --   PLT 297  --    Coagulation Profile: Recent Labs  Lab 02/11/18 0923 02/13/18 0807 02/13/18 0850  INR >10.00* >8.0 >10.00*   Cardiac Enzymes: No results for input(s): CKTOTAL, CKMB, CKMBINDEX, TROPONINI in the last 168 hours. BNP: Invalid input(s): POCBNP CBG: No results for input(s): GLUCAP in the last 168 hours. Urine analysis:    Component Value Date/Time   COLORURINE YELLOW 10/29/2016 0839   APPEARANCEUR CLOUDY (A) 10/29/2016 0839   LABSPEC 1.015 10/29/2016 0839   PHURINE 5.0 10/29/2016 0839   GLUCOSEU NEGATIVE 10/29/2016 0839   HGBUR LARGE (A) 10/29/2016 0839   BILIRUBINUR NEGATIVE 10/29/2016 0839   KETONESUR NEGATIVE 10/29/2016 0839   PROTEINUR 30 (A) 10/29/2016 0839   NITRITE NEGATIVE 10/29/2016 0839   LEUKOCYTESUR NEGATIVE 10/29/2016 0839   Sepsis Labs: @LABRCNTIP (procalcitonin:4,lacticidven:4) )No results found for this or any previous visit (from the past 240 hour(s)).   Radiological Exams on Admission: No results found.     Time spent:60 minutes Code Status:   FULL Family Communication:  Spouse and daughter updated at bedside Disposition Plan: expect 2 day hospitalization Consults called: GI DVT Prophylaxis: SCDs  Orson Eva, DO  [facility] Pager [phone]  If 7PM-7AM, please  contact night-coverage [email or web address] Password [facility] 02/13/2018, 12:34 PM

## 2018-02-13 NOTE — Progress Notes (Signed)
Pt received first unit of blood. Second unit transfusing right now. Show's another blood transfusion that was "ordered" at 1230, yet with loss of unit in the ED, patient has really only received one unit and that order is inaccurate. MD made aware.

## 2018-02-13 NOTE — Patient Instructions (Signed)
POC INR >8.0   Sent to lab  INR >10.0  Hgb down to 7.5 from 13  Elevated BUN/Cr Discussed with Dr Bronson Ing.  Pt will come to [facility] ED for evaluation and admission for probable GI Bleed.  Pt notified.  Magda Paganini RN triage nurse in ED notified and report given.

## 2018-02-14 ENCOUNTER — Encounter (HOSPITAL_COMMUNITY): Payer: Self-pay | Admitting: *Deleted

## 2018-02-14 ENCOUNTER — Other Ambulatory Visit: Payer: Self-pay

## 2018-02-14 ENCOUNTER — Encounter (HOSPITAL_COMMUNITY)
Admission: EM | Disposition: A | Discharge status: Home / Self Care | Payer: Self-pay | Source: Home / Self Care | Attending: Internal Medicine

## 2018-02-14 DIAGNOSIS — I25810 Atherosclerosis of coronary artery bypass graft(s) without angina pectoris: Secondary | ICD-10-CM | POA: Diagnosis not present

## 2018-02-14 DIAGNOSIS — N183 Chronic kidney disease, stage 3 (moderate): Secondary | ICD-10-CM | POA: Diagnosis present

## 2018-02-14 DIAGNOSIS — Z89612 Acquired absence of left leg above knee: Secondary | ICD-10-CM | POA: Diagnosis not present

## 2018-02-14 DIAGNOSIS — E1151 Type 2 diabetes mellitus with diabetic peripheral angiopathy without gangrene: Secondary | ICD-10-CM | POA: Diagnosis present

## 2018-02-14 DIAGNOSIS — N179 Acute kidney failure, unspecified: Secondary | ICD-10-CM | POA: Diagnosis present

## 2018-02-14 DIAGNOSIS — D689 Coagulation defect, unspecified: Secondary | ICD-10-CM | POA: Diagnosis present

## 2018-02-14 DIAGNOSIS — I2510 Atherosclerotic heart disease of native coronary artery without angina pectoris: Secondary | ICD-10-CM | POA: Diagnosis present

## 2018-02-14 DIAGNOSIS — K3189 Other diseases of stomach and duodenum: Secondary | ICD-10-CM | POA: Diagnosis not present

## 2018-02-14 DIAGNOSIS — I10 Essential (primary) hypertension: Secondary | ICD-10-CM | POA: Diagnosis not present

## 2018-02-14 DIAGNOSIS — I129 Hypertensive chronic kidney disease with stage 1 through stage 4 chronic kidney disease, or unspecified chronic kidney disease: Secondary | ICD-10-CM | POA: Diagnosis present

## 2018-02-14 DIAGNOSIS — K319 Disease of stomach and duodenum, unspecified: Secondary | ICD-10-CM | POA: Diagnosis present

## 2018-02-14 DIAGNOSIS — K228 Other specified diseases of esophagus: Secondary | ICD-10-CM

## 2018-02-14 DIAGNOSIS — K922 Gastrointestinal hemorrhage, unspecified: Secondary | ICD-10-CM | POA: Diagnosis not present

## 2018-02-14 DIAGNOSIS — E869 Volume depletion, unspecified: Secondary | ICD-10-CM | POA: Diagnosis present

## 2018-02-14 DIAGNOSIS — I482 Chronic atrial fibrillation: Secondary | ICD-10-CM | POA: Diagnosis present

## 2018-02-14 DIAGNOSIS — Z951 Presence of aortocoronary bypass graft: Secondary | ICD-10-CM | POA: Diagnosis not present

## 2018-02-14 DIAGNOSIS — E1122 Type 2 diabetes mellitus with diabetic chronic kidney disease: Secondary | ICD-10-CM | POA: Diagnosis present

## 2018-02-14 DIAGNOSIS — I252 Old myocardial infarction: Secondary | ICD-10-CM | POA: Diagnosis not present

## 2018-02-14 DIAGNOSIS — K2960 Other gastritis without bleeding: Secondary | ICD-10-CM | POA: Diagnosis present

## 2018-02-14 DIAGNOSIS — Z7982 Long term (current) use of aspirin: Secondary | ICD-10-CM | POA: Diagnosis not present

## 2018-02-14 DIAGNOSIS — K921 Melena: Secondary | ICD-10-CM | POA: Diagnosis present

## 2018-02-14 DIAGNOSIS — K2980 Duodenitis without bleeding: Secondary | ICD-10-CM | POA: Diagnosis not present

## 2018-02-14 DIAGNOSIS — Z7901 Long term (current) use of anticoagulants: Secondary | ICD-10-CM | POA: Diagnosis not present

## 2018-02-14 DIAGNOSIS — D62 Acute posthemorrhagic anemia: Secondary | ICD-10-CM | POA: Diagnosis present

## 2018-02-14 DIAGNOSIS — Z79899 Other long term (current) drug therapy: Secondary | ICD-10-CM | POA: Diagnosis not present

## 2018-02-14 DIAGNOSIS — E785 Hyperlipidemia, unspecified: Secondary | ICD-10-CM | POA: Diagnosis present

## 2018-02-14 DIAGNOSIS — E1142 Type 2 diabetes mellitus with diabetic polyneuropathy: Secondary | ICD-10-CM | POA: Diagnosis present

## 2018-02-14 HISTORY — PX: ESOPHAGOGASTRODUODENOSCOPY: SHX5428

## 2018-02-14 LAB — BPAM RBC
BLOOD PRODUCT EXPIRATION DATE: 201905102359
Blood Product Expiration Date: 201904302359
Blood Product Expiration Date: 201905102359
ISSUE DATE / TIME: 201904241724
ISSUE DATE / TIME: 201904241243
ISSUE DATE / TIME: 201904241305
UNIT TYPE AND RH: 6200
UNIT TYPE AND RH: 6200
Unit Type and Rh: 600

## 2018-02-14 LAB — TYPE AND SCREEN
ABO/RH(D): A POS
ANTIBODY SCREEN: NEGATIVE
Unit division: 0
Unit division: 0
Unit division: 0

## 2018-02-14 LAB — PROTIME-INR
INR: 1.27
INR: 1.23
Prothrombin Time: 15.8 s — ABNORMAL HIGH (ref 11.4–15.2)
Prothrombin Time: 15.4 s — ABNORMAL HIGH (ref 11.4–15.2)

## 2018-02-14 LAB — BASIC METABOLIC PANEL
Anion gap: 10 (ref 5–15)
BUN: 42 mg/dL — AB (ref 6–20)
CALCIUM: 7.5 mg/dL — AB (ref 8.9–10.3)
CO2: 13 mmol/L — ABNORMAL LOW (ref 22–32)
Chloride: 116 mmol/L — ABNORMAL HIGH (ref 101–111)
Creatinine, Ser: 1.09 mg/dL (ref 0.61–1.24)
GFR calc non Af Amer: >60 mL/min (ref >60)
Glucose, Bld: 97 mg/dL (ref 65–99)
Potassium: 3.4 mmol/L — ABNORMAL LOW (ref 3.5–5.1)
SODIUM: 139 mmol/L (ref 135–145)

## 2018-02-14 LAB — CBC
HCT: 25.5 % — ABNORMAL LOW (ref 39.0–52.0)
Hemoglobin: 8.5 g/dL — ABNORMAL LOW (ref 13.0–17.0)
MCH: 29 pg (ref 26.0–34.0)
MCHC: 33.3 g/dL (ref 30.0–36.0)
MCV: 87 fL (ref 78.0–100.0)
PLATELETS: 229 10*3/uL (ref 150–400)
RBC: 2.93 MIL/uL — AB (ref 4.22–5.81)
RDW: 14.6 % (ref 11.5–15.5)
WBC: 7.6 10*3/uL (ref 4.0–10.5)

## 2018-02-14 LAB — GLUCOSE, CAPILLARY: Glucose-Capillary: 95 mg/dL (ref 65–99)

## 2018-02-14 SURGERY — EGD (ESOPHAGOGASTRODUODENOSCOPY)
Anesthesia: Moderate Sedation

## 2018-02-14 MED ORDER — LIDOCAINE VISCOUS 2 % MT SOLN
OROMUCOSAL | Status: AC
Start: 2018-02-14 — End: 2018-02-15
  Filled 2018-02-14: qty 15

## 2018-02-14 MED ORDER — SODIUM CHLORIDE 0.9 % IV SOLN
INTRAVENOUS | Status: DC
  Administered 2018-02-14: 15:00:00 via INTRAVENOUS

## 2018-02-14 MED ORDER — MEPERIDINE HCL 50 MG/ML IJ SOLN
INTRAMUSCULAR | Status: DC | PRN
  Administered 2018-02-14: 25 mg via INTRAVENOUS

## 2018-02-14 MED ORDER — MIDAZOLAM HCL 5 MG/5ML IJ SOLN
INTRAMUSCULAR | Status: AC
  Filled 2018-02-14: qty 10

## 2018-02-14 MED ORDER — MIDAZOLAM HCL 5 MG/5ML IJ SOLN
INTRAMUSCULAR | Status: DC | PRN
  Administered 2018-02-14: 1 mg via INTRAVENOUS
  Administered 2018-02-14: 2 mg via INTRAVENOUS

## 2018-02-14 MED ORDER — LIDOCAINE VISCOUS 2 % MT SOLN
OROMUCOSAL | Status: DC | PRN
  Administered 2018-02-14: 5 mL via OROMUCOSAL

## 2018-02-14 MED ORDER — MEPERIDINE HCL 50 MG/ML IJ SOLN
INTRAMUSCULAR | Status: AC
  Filled 2018-02-14: qty 1

## 2018-02-14 NOTE — Consult Note (Signed)
Reason for [place] Referring Physician:   JONOTHAN [name] is an 82 y.o. male.  HPI: Admitted thru the ED yesterday. Directed to ED by [facility] after noted to have a PT/INR  greater than 90 and INR greater than 10. Patient has been experiencing melena since Sunday or Monday per wife. PT/INR was 90/10 on Monday and he was advised to eats lots of greens. He received Vitamin K 61m x 2 since admission. Has received 2 units of PRBCs since admission. Admission hemoglobin 7.5. Today his hemoglobin 8.5. PT/INR this am 15.8 and 1.27. Hx of atrial fib and CABG and  maintained on Coumadin.  Hx of diabetes for year.   Past Medical History:  Diagnosis Date  . Arteriosclerotic cardiovascular disease (ASCVD)    CABG in 1990s; 2002 total obstruction of LAD, CX and RCA with patent grafts and nl EF; Stress nuc. 2008 - mild LV dilation; normal EF; questionable small anteroapical scar; no ischemia  . Arthritis    "left knee" (11/08/2015)  . Atrial fibrillation ([place]   . Cellulitis 11/08/2015   "both feet"  . Chronic anticoagulation   . Dental crowns present   . Diabetic peripheral neuropathy (HCC)    bilateral lower leg, hands  . History of blood transfusion    "related to my leg surgery?"  . Hypertension    states under control with meds., has been on med. x "long time"  . Iron deficiency anemia   . Jaw cancer ([place] 1990s   "squamous cell"  . [place] 1990[place]   "after my jaw OR"  . Pedal edema    "not a lot", per pt.  . Peripheral vascular disease ([place]   . Presence of retained hardware 07/2015   failed hardware jaw  . Runny nose 07/27/2015   clear drainage, per pt.  . Type II diabetes mellitus ([place]    "diet controlled" (11/08/2015)    Past Surgical History:  Procedure Laterality Date  . AMPUTATION Left 11/11/2015   Procedure: LEFT ABOVE KNEE AMPUTATION;  Surgeon: CAngelia Mould MD;  [place]  Service: Vascular;  Laterality: Left;  . CARDIAC  CATHETERIZATION  1990s; 04/16/2001  . CATARACT EXTRACTION W/ INTRAOCULAR LENS  IMPLANT, BILATERAL Bilateral 09/2015  . COLONOSCOPY N/A 11/27/2014   Procedure: COLONOSCOPY;  Surgeon: NRogene Houston MD;  [facility];  Service: Endoscopy;  Laterality: N/A;  830  . CORONARY ARTERY BYPASS GRAFT  1990's   "CABG X4"  . FRACTURE SURGERY    . I&D EXTREMITY Left 11/10/2015   Procedure: INCISION AND DRAINAGE LEFT FOOT, AMPUTATION OF LEFT THIRD TOE;  Surgeon: BConrad Cottonwood MD;  [place] [facility]  Service: Vascular;  Laterality: Left;  . INCISION AND DRAINAGE ABSCESS Left 06/16/2005   wide exc. abscess 5th toe  . MANDIBULAR HARDWARE REMOVAL Left 08/02/2015   Procedure:  HARDWARE REMOVAL TWO MANDIBULAR SCREWS;  Surgeon: JJannette Fogo DDS;  [place] [facility]  Service: Oral Surgery;  Laterality: Left;  . ORIF TIBIA FRACTURE Left ~ 1970  . PERIPHERAL VASCULAR CATHETERIZATION N/A 08/14/2016   Procedure: Abdominal Aortogram w/Lower Extremity;  Surgeon: CAngelia Mould MD;  [facility];  Service: Cardiovascular;  Laterality: N/A;  . SQUAMOUS CELL CARCINOMA EXCISION Left 1993   "took my jaw out; got cadavar in there now"  . TONSILLECTOMY      Family History  Problem Relation Age of Onset  . Heart disease Mother     Social History:  reports that he has never smoked. He has never used smokeless tobacco. He reports that he does not drink alcohol or use drugs.  Allergies:  Allergies  Allergen Reactions  . Lipitor [Atorvastatin] Other (See Comments)    SEVERE HEADACHE  . Simvastatin Other (See Comments)    MUSCLE ACHES  . Penicillins Rash    Has patient had a PCN reaction causing immediate rash, facial/tongue/throat swelling, SOB or lightheadedness with hypotension: Yes Has patient had a PCN reaction causing severe rash involving mucus membranes or skin necrosis: No Has patient had a PCN reaction that required hospitalization No Has patient had a PCN  reaction occurring within the last 10 years: No If all of the above answers are "NO", then may proceed with Cephalosporin use.   . Sulfa Antibiotics Rash    Tolerates silver sulfadiazine cream at home    Medications: I have reviewed the patient's current medications.  Results for orders placed or performed during the hospital encounter of 02/13/18 (from the past 48 hour(s))  Type and screen     Status: None   Collection Time: 02/13/18 11:15 AM  Result Value Ref Range   ABO/RH(D) A POS    Antibody Screen NEG    Sample Expiration 02/16/2018    Unit Number [id number]    Blood Component Type RBC, LR IRR    Unit division 00    Status of Unit ISSUED,FINAL    Transfusion Status OK TO TRANSFUSE    Crossmatch Result Compatible    Unit Number [id number]    Blood Component Type RED CELLS,LR    Unit division 00    Status of Unit ISSUED,FINAL    Transfusion Status OK TO TRANSFUSE    Crossmatch Result      Compatible Performed at [facility], [address], [place], [place] [address]    Unit Number [id number]    Blood Component Type RED CELLS,LR    Unit division 00    Status of Unit ISSUED,FINAL    Transfusion Status OK TO TRANSFUSE    Crossmatch Result Compatible   Prepare RBC     Status: None   Collection Time: 02/13/18 11:15 AM  Result Value Ref Range   Order Confirmation      ORDER PROCESSED BY BLOOD BANK Performed at [facility], [address] [place], [place] [address]   ABO/Rh     Status: None   Collection Time: 02/13/18 11:15 AM  Result Value Ref Range   ABO/RH(D)      A POS Performed at [facility], [address], [place], [place] [address]   I-stat chem 8, ed     Status: Abnormal   Collection Time: 02/13/18 11:25 AM  Result Value Ref Range   Sodium 141 135 - 145 mmol/L   Potassium 4.0 3.5 - 5.1 mmol/L   Chloride 112 (H) 101 - 111 mmol/L   BUN 61 (H) 6 - 20 mg/dL   Creatinine, Ser 1.50 (H) 0.61 - 1.24 mg/dL   Glucose, Bld 117 (H) 65 - 99 mg/dL    Calcium, Ion 1.14 (L) 1.15 - 1.40 mmol/L   TCO2 14 (L) 22 - 32 mmol/L   Hemoglobin 7.8 (L) 13.0 - 17.0 g/dL   HCT 23.0 (L) 39.0 - 52.0 %  POC occult blood, ED     Status: Abnormal   Collection Time: 02/13/18 11:39 AM  Result Value Ref Range   Fecal Occult Bld POSITIVE (A) NEGATIVE  Prepare RBC     Status: None  Collection Time: 02/13/18  5:18 PM  Result Value Ref Range   Order Confirmation      ORDER PROCESSED BY BLOOD BANK Performed at [facility], [address], [place], [place] [address]   Protime-INR     Status: Abnormal   Collection Time: 02/13/18  9:39 PM  Result Value Ref Range   Prothrombin Time 15.7 (H) 11.4 - 15.2 seconds   INR 1.26     Comment: Performed at [facility], [address] [place], [place] [address]  Protime-INR     Status: Abnormal   Collection Time: 02/14/18  3:41 AM  Result Value Ref Range   Prothrombin Time 15.8 (H) 11.4 - 15.2 seconds   INR 1.27     Comment: Performed at [facility], [address], [place], [place] [address]  Basic metabolic panel     Status: Abnormal   Collection Time: 02/14/18  3:41 AM  Result Value Ref Range   Sodium 139 135 - 145 mmol/L   Potassium 3.4 (L) 3.5 - 5.1 mmol/L   Chloride 116 (H) 101 - 111 mmol/L   CO2 13 (L) 22 - 32 mmol/L   Glucose, Bld 97 65 - 99 mg/dL   BUN 42 (H) 6 - 20 mg/dL   Creatinine, Ser 1.09 0.61 - 1.24 mg/dL   Calcium 7.5 (L) 8.9 - 10.3 mg/dL   GFR calc non Af Amer >60 >60 mL/min   GFR calc Af Amer >60 >60 mL/min    Comment: (NOTE) The eGFR has been calculated using the CKD EPI equation. This calculation has not been validated in all clinical situations. eGFR's persistently <60 mL/min signify possible Chronic Kidney Disease.    Anion gap 10 5 - 15    Comment: Performed at [facility], [address] [place], [place] [address]  CBC     Status: Abnormal   Collection Time: 02/14/18  3:41 AM  Result Value Ref Range   WBC 7.6 4.0 - 10.5 K/uL   RBC 2.93 (L) 4.22 - 5.81 MIL/uL   Hemoglobin 8.5  (L) 13.0 - 17.0 g/dL   HCT 25.5 (L) 39.0 - 52.0 %   MCV 87.0 78.0 - 100.0 fL   MCH 29.0 26.0 - 34.0 pg   MCHC 33.3 30.0 - 36.0 g/dL   RDW 14.6 11.5 - 15.5 %   Platelets 229 150 - 400 K/uL    Comment: Performed at [facility], [address] [place], [place] [address]    No results found.  ROS Blood pressure (!) 147/73, pulse (!) 54, temperature (!) 97.5 F (36.4 C), temperature source Oral, resp. rate 18, height _0  (1.727 m), weight 170 lb (77.1 kg), SpO2 99 %. Physical Exam Alert and oriented. Skin warm and dry. Oral mucosa is moist.   . Sclera anicteric, conjunctivae is pink. Thyroid not enlarged. No cervical lymphadenopathy. Lungs clear. Heart regular rate and rhythm.  Abdomen is soft. Bowel sounds are positive. No hepatomegaly. No abdominal masses felt. No tenderness.  Left left amputee.    Assessment/Plan: Melena. Gastric ulcer needs to be ruled out. For EGD today. Agree with Protonix.   Jaquavion Mccannon L Keyden Pavlov 02/14/2018, 8:19 AM

## 2018-02-14 NOTE — Care Management (Signed)
Pt seen at bedside. Wife present. Pt has been on warfarin for longer than he can remember. He has never been offered another anticoagulant. Followed by coumadin clinic. Pt says he discussed other options with hospitalist. Benefits check completed for Eliquis vs Xeralto:       # 5.  S/W DANA @ [facility] RX # [phone]    1. ELIQUIS 5 MG BID  COVER- YES  CO-PAY- $ 87.20  PRIOR APPROVAL- NO    2. ELIQUIS 2.5 MG BID  COVER- YES  CO-PAY- $ 87.27  PRIOR APPROVAL- NO    3. XARELTO 20 MG DAILY  COVER- YES  CO-PAY- $ 88.02  PRIOR APPROVAL- NO    4. XARELTO 15 MG BID  COVER- YES  CO-PAY- $ 150.00  PRIOR APPROVAL- NO   NO DEDUCTIBLE ON PLAN   PREFERRED PHARMACY : [facility]  M/O OR RETAIL 90 DAY SUPPLY $ 192.74    CM discussed results and options with pt and wife. They will discuss between themselves and with MD's. CM will cont to follow.

## 2018-02-14 NOTE — Progress Notes (Signed)
Brief EGD note:  Normal esophagus and GE junction. Focal gastritis in the gastric body with erosions but no stigmata of bleed. Bulbar duodenitis without stigmata of bleed.

## 2018-02-14 NOTE — Op Note (Signed)
[facility] Patient Name: [name] Procedure Date: 02/14/2018 3:26 PM MRN: [id number] Date of Birth: 17-Sep-1936 Attending MD: Hildred Laser , MD CSN: [id number] Age: 82 Admit Type: Outpatient Procedure:                Upper GI endoscopy Indications:              Acute post hemorrhagic anemia, Melena Providers:                Hildred Laser, MD, Gwynneth Albright RN, RN,                            Aram Candela Referring MD:             Orson Eva, MD Medicines:                Lidocaine spray, Meperidine 25 mg IV, Midazolam 3                            mg IV Complications:            No immediate complications. Estimated Blood Loss:     Estimated blood loss: none. Procedure:                Pre-Anesthesia Assessment:                           - Prior to the procedure, a History and Physical                            was performed, and patient medications and                            allergies were reviewed. The patient's tolerance of                            previous anesthesia was also reviewed. The risks                            and benefits of the procedure and the sedation                            options and risks were discussed with the patient.                            All questions were answered, and informed consent                            was obtained. Prior Anticoagulants: The patient                            last took aspirin 1 day and Coumadin (warfarin) 3                            days prior to the procedure. ASA Grade Assessment:  III - A patient with severe systemic disease. After                            reviewing the risks and benefits, the patient was                            deemed in satisfactory condition to undergo the                            procedure.                           After obtaining informed consent, the endoscope was                            passed under direct vision. Throughout the                         procedure, the patient's blood pressure, pulse, and                            oxygen saturations were monitored continuously. The                            EG-[id number] ([id number]) scope was introduced through the                            mouth, and advanced to the second part of duodenum.                            The upper GI endoscopy was accomplished without                            difficulty. The patient tolerated the procedure                            well. Scope In: 3:44:36 PM Scope Out: 3:50:51 PM Total Procedure Duration: 0 hours 6 minutes 15 seconds  Findings:      The examined esophagus was normal.      The Z-line was irregular and was found 41 cm from the incisors.      A few localized, non-bleeding erosions were found in the gastric body       and on the posterior wall of the stomach. There were no stigmata of       recent bleeding.      The exam of the stomach was otherwise normal.      Patchy mild inflammation characterized by congestion (edema), erythema       and granularity was found in the duodenal bulb.      An acquired benign-appearing, intrinsic mild stenosis was found in the       second portion of the duodenum. Impression:               - Normal esophagus.                           -  Z-line irregular, 41 cm from the incisors.                           - Non-bleeding erosive gastropathy.                           - Duodenitis.                           - Non-critical sricture involving 2nd part of                            duodenum.                           - No specimens collected. Moderate Sedation:      Moderate (conscious) sedation was administered by the endoscopy nurse       and supervised by the endoscopist. The following parameters were       monitored: oxygen saturation, heart rate, blood pressure, CO2       capnography and response to care. Total physician intraservice time was       9 minutes. Recommendation:           -  Return patient to hospital ward for ongoing care.                           - Full liquid diet today.                           - Continue present medications.                           - To visualize the small bowel, perform video                            capsule endoscopy tomorrow.                           - Perform an H. pylori serology tomorrow. Procedure Code(s):        --- Professional ---                           [id number], Esophagogastroduodenoscopy, flexible,                            transoral; diagnostic, including collection of                            specimen(s) by brushing or washing, when performed                            (separate procedure) Diagnosis Code(s):        --- Professional ---                           K22.8, Other specified diseases of esophagus  K31.89, Other diseases of stomach and duodenum                           K29.80, Duodenitis without bleeding                           D62, Acute posthemorrhagic anemia                           K92.1, Melena (includes Hematochezia) CPT copyright 2017 [employer]. All rights reserved. The codes documented in this report are preliminary and upon coder review may  be revised to meet current compliance requirements. Hildred Laser, MD Hildred Laser, MD 02/14/2018 4:16:18 PM This report has been signed electronically. Number of Addenda: 0

## 2018-02-14 NOTE — Progress Notes (Signed)
Dressing's changed on right lower leg and foot along with cleansing. Clean, dry and intact with no drainage. Consent signed and placed in chart for patient EGD procedure. Will continue to monitor.

## 2018-02-14 NOTE — Progress Notes (Signed)
PROGRESS NOTE  THELTON GRACA EGB:[id number] DOB: [birth year] DOA: 02/13/2018 PCP: Lemmie Evens, MD  Brief History:  82 y.o. male with medical history of atrial fibrillation, diet-controlled diabetes mellitus, hypertension, peripheral vascular disease, coronary artery disease, hyperlipidemia presenting with 3-day history of melanotic stools and generalized weakness.  The patient had his INR checked on 02/11/2018, it was noted to be greater than 10.  He was told to stop his warfarin.  He had his INR rechecked on 02/13/2018.  Again, it was noted to be greater than 10.  As result, the patient was called by the cardiology office to go to the emergency department for further evaluation.  The patient has been on doxycycline for the better part of one year, but he has stopped 2 weeks prior to this admission.  He denies any NSAIDs over-the-counter.  He states that his appetite has been as good as usual, but wife states that it has been poor for the past week. In the emergency department, the patient was afebrile hemodynamically stable saturating 100% room air.  Hemoglobin was 7.5.  Serum creatinine was 1.47.  INR was greater than 10.  Hemoccult was positive with melanotic stool.  The patient was given vitamin K 10 mg IV and Kcentra   Assessment/Plan: Acute Blood Loss Anemia/Symptomatic Anemia/Melena -GI consulted -2 units PRBC given this admission -continue PPI -am CBC  Coagulopathy -Etiology unclear, suspect poor nutrition -Patient stopped doxycycline 2 weeks prior to this admission -Kcentra and vitamin K given -Recheck INR--1.23 -Holding Coumadin -given his hx of wide INR fluctuations and now presenting with INR>10, discussed risks, benefits and alternatives to switch to NOAC after discharge to which pt is agreeable  Chronic atrial fibrillation -Holding Coumadin secondary to coagulopathy and GI bleed -Continue nebivolol  Essential hypertension -Continue nebivolol -Holding  amlodipine  Acute on chronic renal failure--CKD stage III -Baseline creatinine 0.8-1.1 -Holding losartan/HCTZ -presenting creatinine 1.47 -Secondary to volume depletion -Transfused PRBC-->improved  Hyperlipidemia -Continue statin  Coronary artery disease -No chest pain presently -Holding aspirin secondary to GI bleed -Continue statin  Peripheral vascular disease with chronic wound -Continue daily dressing changes   Disposition Plan:   Home when cleared by GI Family Communication:   Spouse updated at bedside 4/25  Consultants:    Code Status:  FULL / DNR  DVT Prophylaxis:  [place] Heparin / [place] Lovenox   Procedures: As Listed in Progress Note Above  Antibiotics: None    Subjective: Patient states that he still had 2 melanotic stools since admission.  He denies any fevers, chills, chest pain, shortness breath, nausea, vomiting, diarrhea, abdominal pain.  No dysuria hematuria.  No rashes.  Objective: Vitals:   02/13/18 1757 02/13/18 2045 02/14/18 0525 02/14/18 0846  BP: (!) 115/57 (!) 129/58 (!) 147/73 (!) 135/52  Pulse: 60 74 (!) 54 64  Resp: 18     Temp: 97.7 F (36.5 C) 97.7 F (36.5 C) (!) 97.5 F (36.4 C)   TempSrc: Oral Oral Oral   SpO2: 100% 100% 99% 98%  Weight:      Height:        Intake/Output Summary (Last 24 hours) at 02/14/2018 1059 Last data filed at 02/13/2018 2100 Gross per 24 hour  Intake 2395.25 ml  Output 250 ml  Net 2145.25 ml   Weight change:  Exam:   General:  Pt is alert, follows commands appropriately, not in acute distress  HEENT: No icterus, No thrush, No neck mass, [place]/AT  Cardiovascular: IRRR, S1/S2, no rubs, no gallops  Respiratory: CTA bilaterally, no wheezing, no crackles, no rhonchi  Abdomen: Soft/+BS, non tender, non distended, no guarding  Extremities: No edema, No lymphangitis, No petechiae, No rashes, no synovitis   Data Reviewed: I have personally reviewed following labs and imaging studies Basic  Metabolic Panel: Recent Labs  Lab 02/13/18 0850 02/13/18 1125 02/14/18 0341  NA 136 141 139  K 3.5 4.0 3.4*  CL 113* 112* 116*  CO2 13*  --  13*  GLUCOSE 122* 117* 97  BUN 63* 61* 42*  CREATININE 1.47* 1.50* 1.09  CALCIUM 7.5*  --  7.5*   Liver Function Tests: No results for input(s): AST, ALT, ALKPHOS, BILITOT, PROT, ALBUMIN in the last 168 hours. No results for input(s): LIPASE, AMYLASE in the last 168 hours. No results for input(s): AMMONIA in the last 168 hours. Coagulation Profile: Recent Labs  Lab 02/13/18 0807 02/13/18 0850 02/13/18 2139 02/14/18 0341 02/14/18 1009  INR >8.0 >10.00* 1.26 1.27 1.23   CBC: Recent Labs  Lab 02/13/18 0850 02/13/18 1125 02/14/18 0341  WBC 10.2  --  7.6  HGB 7.5* 7.8* 8.5*  HCT 22.6* 23.0* 25.5*  MCV 88.3  --  87.0  PLT 297  --  229   Cardiac Enzymes: No results for input(s): CKTOTAL, CKMB, CKMBINDEX, TROPONINI in the last 168 hours. BNP: Invalid input(s): POCBNP CBG: No results for input(s): GLUCAP in the last 168 hours. HbA1C: No results for input(s): HGBA1C in the last 72 hours. Urine analysis:    Component Value Date/Time   COLORURINE YELLOW 10/29/2016 0839   APPEARANCEUR CLOUDY (A) 10/29/2016 0839   LABSPEC 1.015 10/29/2016 0839   PHURINE 5.0 10/29/2016 0839   GLUCOSEU NEGATIVE 10/29/2016 0839   HGBUR LARGE (A) 10/29/2016 0839   BILIRUBINUR NEGATIVE 10/29/2016 0839   KETONESUR NEGATIVE 10/29/2016 0839   PROTEINUR 30 (A) 10/29/2016 0839   NITRITE NEGATIVE 10/29/2016 0839   LEUKOCYTESUR NEGATIVE 10/29/2016 0839   Sepsis Labs: @LABRCNTIP (procalcitonin:4,lacticidven:4) )No results found for this or any previous visit (from the past 240 hour(s)).   Scheduled Meds: . Difluprednate  1 drop Both Eyes BID  . feeding supplement  1 Container Oral BID  . ferrous sulfate  325 mg Oral Q breakfast  . lovastatin  10 mg Oral q morning - 10a  . nebivolol  10 mg Oral Daily  . omega-3 acid ethyl esters  1 g Oral Daily  .  silver sulfADIAZINE  1 application Topical Daily  . vitamin B-12  2,000 mcg Oral q morning - 10a   Continuous Infusions: . 0.9 % NaCl with KCl 20 mEq / L 50 mL/hr at 02/13/18 2105  . pantoprozole (PROTONIX) infusion 8 mg/hr (02/14/18 0024)    Procedures/Studies: No results found.  Orson Eva, DO  [facility] Pager [phone]  If 7PM-7AM, please contact night-coverage [email or web address] Password [email or web address] 02/14/2018, 10:59 AM   LOS: 0 days

## 2018-02-15 ENCOUNTER — Encounter (HOSPITAL_COMMUNITY): Payer: Self-pay | Admitting: Internal Medicine

## 2018-02-15 ENCOUNTER — Encounter (HOSPITAL_COMMUNITY)
Admission: EM | Disposition: A | Discharge status: Home / Self Care | Payer: Self-pay | Source: Home / Self Care | Attending: Internal Medicine

## 2018-02-15 HISTORY — PX: GIVENS CAPSULE STUDY: SHX5432

## 2018-02-15 LAB — BASIC METABOLIC PANEL
Anion gap: 8 (ref 5–15)
BUN: 21 mg/dL — ABNORMAL HIGH (ref 6–20)
CALCIUM: 8 mg/dL — AB (ref 8.9–10.3)
CO2: 17 mmol/L — AB (ref 22–32)
Chloride: 116 mmol/L — ABNORMAL HIGH (ref 101–111)
Creatinine, Ser: 1.11 mg/dL (ref 0.61–1.24)
GFR calc Af Amer: >60 mL/min (ref >60)
GFR calc non Af Amer: >60 mL/min (ref >60)
GLUCOSE: 95 mg/dL (ref 65–99)
Potassium: 4.1 mmol/L (ref 3.5–5.1)
Sodium: 141 mmol/L (ref 135–145)

## 2018-02-15 LAB — PROTIME-INR
INR: 1.29
PROTHROMBIN TIME: 15.9 s — AB (ref 11.4–15.2)

## 2018-02-15 LAB — CBC
HEMATOCRIT: 25.8 % — AB (ref 39.0–52.0)
Hemoglobin: 8.5 g/dL — ABNORMAL LOW (ref 13.0–17.0)
MCH: 29.3 pg (ref 26.0–34.0)
MCHC: 32.9 g/dL (ref 30.0–36.0)
MCV: 89 fL (ref 78.0–100.0)
Platelets: 258 10*3/uL (ref 150–400)
RBC: 2.9 MIL/uL — ABNORMAL LOW (ref 4.22–5.81)
RDW: 15.1 % (ref 11.5–15.5)
WBC: 6.9 10*3/uL (ref 4.0–10.5)

## 2018-02-15 SURGERY — IMAGING PROCEDURE, GI TRACT, INTRALUMINAL, VIA CAPSULE

## 2018-02-15 MED ORDER — PANTOPRAZOLE SODIUM 40 MG PO TBEC: 40.0000 mg | DELAYED_RELEASE_TABLET | Freq: Every day | ORAL | 0 refills | Status: DC

## 2018-02-15 MED ORDER — RIVAROXABAN 20 MG PO TABS: 20.0000 mg | ORAL_TABLET | Freq: Every day | ORAL | 0 refills | Status: DC

## 2018-02-15 MED ORDER — SODIUM CHLORIDE 0.9 % IV SOLN: INTRAVENOUS | Status: DC

## 2018-02-15 NOTE — Care Management Important Message (Signed)
Important Message  Patient Details  Name: [name] MRN: [id number] Date of Birth: 04-02-1936   Medicare Important Message Given:  Yes    Sherald Barge, RN 02/15/2018, 3:42 PM

## 2018-02-15 NOTE — Op Note (Signed)
Small Bowel Givens Capsule Study Procedure date: 02/15/2018  Referring Provider:  Orson Eva, MD PCP:  Dr. Lemmie Evens, MD  Indication for procedure:   Patient is 82 year old Caucasian male who presented with melena in the setting of supratherapeutic INR which was corrected.  He has received 2 units of PRBCs.  He underwent EGD yesterday no bleeding lesion was identified.  He is therefore undergoing small bowel given capsule study to look for source of GI bleed.  He has stopped bleeding.  He passed greenish stool earlier today.  Findings:   Patient was able to swallow given capsule without any difficulty. Study duration 7 hours 59 minutes and 31 seconds. Given capsule did not reach cecum and study.. No bleeding lesions identified in the small bowel that was examined. There is scalloping to jejunal mucosa without erosions or ulceration.  First Gastric image: 1 hour and 23 seconds. First Duodenal image: 1 hour 10 minutes and 24 seconds. First Ileo-Cecal Valve image: NA; capsule did not reach cecum. First Cecal image: NA Gastric Passage time: 10 minutes. Small Bowel Passage time: Cannot be calculated has capsule did not reach cecum.  Summary & Recommendations: Esophageal transit time was 1 hour suggesting esophageal dysmotility.  Patient does not have dysphagia.  Therefore no further work-up indicated. Scalloping noted to jejunal mucosa without erosions or ulcers. No bleeding lesions identified in small bowel that was examined. Study incomplete as capsule did not reach cecum.  Will check celiac antibody panel. Anticoagulant can be resumed in 3 to 4 days. Consider not using aspirin if at all possible. Will check H&H next week and plan to see patient in office in 4 weeks.

## 2018-02-15 NOTE — Discharge Summary (Signed)
Physician Discharge Summary  [name] XNA:[id number] DOB: August 29, 1936 DOA: 02/13/2018  PCP: Lemmie Evens, MD  Admit date: 02/13/2018 Discharge date: 02/15/2018  Admitted From: Home Disposition:  Home / SNF  Recommendations for Outpatient Follow-up:  1. Follow up with PCP in 1-2 weeks 2. Please obtain BMP/CBC in one week    Discharge Condition: Stable CODE STATUS:FULL Diet recommendation: Heart Healthy   Brief/Interim Summary: 82 y.o.malewith medical history ofatrial fibrillation, diet-controlled diabetes mellitus, hypertension, peripheral vascular disease, coronary artery disease, hyperlipidemia presenting with 3-day history of melanotic stools and generalized weakness. The patient had his INR checked on 02/11/2018, it was noted to be greater than 10. He was told to stop his warfarin. He had his INR rechecked on 02/13/2018. Again, it was noted to be greater than 10. As result, the patient was called by the cardiology office to go to the emergency department for further evaluation. The patient has been on doxycycline for the better part of one year, but he has stopped 2 weeks prior to this admission. He denies any NSAIDs over-the-counter. He states that his appetite has been as good as usual, but wife states that it has been poor for the past week. In the emergency department, the patient was afebrile hemodynamically stable saturating 100% room air. Hemoglobin was 7.5. Serum creatinine was 1.47. INR was greater than 10. Hemoccult was positive with melanotic stool. The patient was given vitamin K 10 mg IV and Kcentra    Discharge Diagnoses:  Acute Blood Loss Anemia/Symptomatic Anemia/Melena -02/14/2018 EGD--focal erosive gastritis without bleeding stigmata, bulbar duodenitis without bleeding stigmata -GI consult appreciated--> capsule endoscopy 02/15/2018-->Scalloping noted to jejunal mucosa without erosions or ulcers. No bleeding lesions identified in small bowel  that was examined. -no further work up -2 units PRBCgiven this admission--Hgb since transfusion -continuePPI -am CBC -ok start Xarelto 4/29 per GI   Coagulopathy -Etiology unclear, suspect poor nutrition -Patient stopped doxycycline 2 weeks prior to this admission -Kcentra and vitamin K given -Recheck INR--1.23 -Holding Coumadin -given his hx of wide INR fluctuations and now presenting with INR>10, discussed risks, benefits and alternatives to switch to NOAC after discharge to which pt is agreeable  Chronic atrial fibrillation -Holding Coumadin secondary to coagulopathy and GI bleed -Continue nebivolol -rate controlled  Essential hypertension -Continue nebivolol -Holding amlodipine-->restart -holding losartan due to soft BPs  Acute on chronic renal failure--CKD stage III -Baseline creatinine 0.8-1.1 -Holding losartan/HCTZ -presenting creatinine 1.47 -Secondary to volume depletion -TransfusedPRBC-->improved -serum creatinine 1.11 at time of d/c   Hyperlipidemia -Continue statin  Coronary artery disease -No chest pain presently -Holding aspirin secondary to GI bleed -Continue statin  Peripheral vascular disease with chronic wound -Continue daily dressing changes      Discharge Instructions   Allergies as of 02/15/2018      Reactions   Lipitor [atorvastatin] Other (See Comments)   SEVERE HEADACHE   Simvastatin Other (See Comments)   MUSCLE ACHES   Penicillins Rash   Has patient had a PCN reaction causing immediate rash, facial/tongue/throat swelling, SOB or lightheadedness with hypotension: Yes Has patient had a PCN reaction causing severe rash involving mucus membranes or skin necrosis: No Has patient had a PCN reaction that required hospitalization No Has patient had a PCN reaction occurring within the last 10 years: No If all of the above answers are "NO", then may proceed with Cephalosporin use.   Sulfa Antibiotics Rash   Tolerates silver  sulfadiazine cream at home      Medication List  STOP taking these medications   losartan-hydrochlorothiazide 100-12.5 MG tablet Commonly known as:  HYZAAR   warfarin 1 MG tablet Commonly known as:  COUMADIN     TAKE these medications   amLODipine 10 MG tablet Commonly known as:  NORVASC Take 1 tablet (10 mg total) by mouth daily.   aspirin EC 81 MG tablet Take 81 mg by mouth daily.   CENTRUM SILVER PO Take 1 tablet by mouth daily.   DUREZOL 0.05 % Emul Generic drug:  Difluprednate Place 1 drop into both eyes 2 (two) times daily.   feeding supplement Liqd Take 1 Container by mouth 3 (three) times daily between meals. What changed:  when to take this   ferrous sulfate 325 (65 FE) MG tablet Take 65 mg by mouth daily with breakfast.   Fish Oil 1200 MG Caps Take 1,200 mg by mouth daily.   lovastatin 10 MG tablet Commonly known as:  MEVACOR Take 10 mg by mouth daily.   nebivolol 10 MG tablet Commonly known as:  BYSTOLIC Take 10 mg by mouth daily.   nystatin cream Commonly known as:  MYCOSTATIN Apply 1 application topically 3 (three) times daily.   pantoprazole 40 MG tablet Commonly known as:  PROTONIX Take 1 tablet (40 mg total) by mouth daily.   PHILLIPS COLON HEALTH PO Take 1 capsule by mouth daily.   rivaroxaban 20 MG Tabs tablet Commonly known as:  XARELTO Take 1 tablet (20 mg total) by mouth daily with supper.   silver sulfADIAZINE 1 % cream Commonly known as:  SILVADENE Apply topically daily. What changed:    how much to take  additional instructions   vitamin B-12 1000 MCG tablet Commonly known as:  CYANOCOBALAMIN Take 2,000 mcg by mouth every morning.       Allergies  Allergen Reactions  . Lipitor [Atorvastatin] Other (See Comments)    SEVERE HEADACHE  . Simvastatin Other (See Comments)    MUSCLE ACHES  . Penicillins Rash    Has patient had a PCN reaction causing immediate rash, facial/tongue/throat swelling, SOB or  lightheadedness with hypotension: Yes Has patient had a PCN reaction causing severe rash involving mucus membranes or skin necrosis: No Has patient had a PCN reaction that required hospitalization No Has patient had a PCN reaction occurring within the last 10 years: No If all of the above answers are "NO", then may proceed with Cephalosporin use.   . Sulfa Antibiotics Rash    Tolerates silver sulfadiazine cream at home    Consultations:  GI   Procedures/Studies:  No results found.      Discharge Exam: Vitals:   02/15/18 0541 02/15/18 1420  BP: 131/88 132/67  Pulse: (!) 59 (!) 40  Resp:  19  Temp: 98.1 F (36.7 C) 97.6 F (36.4 C)  SpO2: 100% 100%   Vitals:   02/14/18 1640 02/14/18 2138 02/15/18 0541 02/15/18 1420  BP: (!) 123/49 124/64 131/88 132/67  Pulse: (!) 48 (!) 48 (!) 59 (!) 40  Resp: 16   19  Temp: 97.7 F (36.5 C) 97.7 F (36.5 C) 98.1 F (36.7 C) 97.6 F (36.4 C)  TempSrc: Oral Oral Oral Oral  SpO2: 100% 100% 100% 100%  Weight:      Height:        General: Pt is alert, awake, not in acute distress Cardiovascular: RRR, S1/S2 +, no rubs, no gallops Respiratory: CTA bilaterally, no wheezing, no rhonchi Abdominal: Soft, NT, ND, bowel sounds + Extremities: no edema, no cyanosis  The results of significant diagnostics from this hospitalization (including imaging, microbiology, ancillary and laboratory) are listed below for reference.    Significant Diagnostic Studies: No results found.   Microbiology: No results found for this or any previous visit (from the past 240 hour(s)).   Labs: Basic Metabolic Panel: Recent Labs  Lab 02/13/18 0850 02/13/18 1125 02/14/18 0341 02/15/18 0826  NA 136 141 139 141  K 3.5 4.0 3.4* 4.1  CL 113* 112* 116* 116*  CO2 13*  --  13* 17*  GLUCOSE 122* 117* 97 95  BUN 63* 61* 42* 21*  CREATININE 1.47* 1.50* 1.09 1.11  CALCIUM 7.5*  --  7.5* 8.0*   Liver Function Tests: No results for input(s): AST, ALT,  ALKPHOS, BILITOT, PROT, ALBUMIN in the last 168 hours. No results for input(s): LIPASE, AMYLASE in the last 168 hours. No results for input(s): AMMONIA in the last 168 hours. CBC: Recent Labs  Lab 02/13/18 0850 02/13/18 1125 02/14/18 0341 02/15/18 0826  WBC 10.2  --  7.6 6.9  HGB 7.5* 7.8* 8.5* 8.5*  HCT 22.6* 23.0* 25.5* 25.8*  MCV 88.3  --  87.0 89.0  PLT 297  --  229 258   Cardiac Enzymes: No results for input(s): CKTOTAL, CKMB, CKMBINDEX, TROPONINI in the last 168 hours. BNP: Invalid input(s): POCBNP CBG: Recent Labs  Lab 02/14/18 1446  GLUCAP 95    Time coordinating discharge:  36 minutes  Signed:  Orson Eva, DO [facility] Pager: [phone] 02/15/2018, 5:43 PM

## 2018-02-15 NOTE — Care Management (Signed)
Pt will DC on [facility]. Pt given 30-day free card.

## 2018-02-15 NOTE — Progress Notes (Signed)
Patient is to be discharged home and in stable condition. Patient's IV removed, WNL. Patient given discharge instructions and verbalized understanding. Patient will be escorted out by staff via wheelchair.  Ashyla Luth P Dishmon, RN  

## 2018-02-17 LAB — TISSUE TRANSGLUTAMINASE, IGA: Tissue Transglutaminase Ab, IgA: 37 U/mL — ABNORMAL HIGH (ref 0–3)

## 2018-02-18 LAB — H. PYLORI ANTIBODY, IGG

## 2018-02-19 ENCOUNTER — Other Ambulatory Visit (INDEPENDENT_AMBULATORY_CARE_PROVIDER_SITE_OTHER): Payer: Self-pay | Admitting: *Deleted

## 2018-02-19 DIAGNOSIS — K922 Gastrointestinal hemorrhage, unspecified: Secondary | ICD-10-CM

## 2018-02-19 LAB — GLIADIN ANTIBODIES, SERUM
Antigliadin Abs, IgA: 241 units — ABNORMAL HIGH (ref 0–19)
Gliadin IgG: 35 units — ABNORMAL HIGH (ref 0–19)

## 2018-02-20 LAB — RETICULIN ANTIBODIES, IGA W TITER: Reticulin Ab, IgA: NEGATIVE titer (ref <2.5)

## 2018-02-21 ENCOUNTER — Other Ambulatory Visit (INDEPENDENT_AMBULATORY_CARE_PROVIDER_SITE_OTHER): Payer: Self-pay | Admitting: *Deleted

## 2018-02-21 ENCOUNTER — Encounter (INDEPENDENT_AMBULATORY_CARE_PROVIDER_SITE_OTHER): Payer: Self-pay | Admitting: *Deleted

## 2018-02-21 DIAGNOSIS — K921 Melena: Secondary | ICD-10-CM

## 2018-02-21 NOTE — Patient Instructions (Addendum)
KRISTAIN HU  02/21/2018     @PREFPERIOPPHARMACY @   Your procedure is scheduled on  02/25/2018 .  Report to [facility] at  7:55   A.M.  Call this number if you have problems the morning of surgery:  [phone]   Remember:  Do not eat food or drink liquids after midnight.  Take these medicines the morning of surgery with A SIP OF WATER  Norvasc, nebivolol, protonix.   Do not wear jewelry, make-up or nail polish.  Do not wear lotions, powders, or perfumes, or deodorant.  Do not shave 48 hours prior to surgery.  Men may shave face and neck.  Do not bring valuables to the hospital.  [facility] is not responsible for any belongings or valuables.  Contacts, dentures or bridgework may not be worn into surgery.  Leave your suitcase in the car.  After surgery it may be brought to your room.  For patients admitted to the hospital, discharge time will be determined by your treatment team.  Patients discharged the day of surgery will not be allowed to drive home.   Name and phone number of your driver:   family Special instructions:  Follow the diet instructions given to you by Dr Olevia Perches office.  Please read over the following fact sheets that you were given. Anesthesia Post-op Instructions and Care and Recovery After Surgery       Esophagogastroduodenoscopy Esophagogastroduodenoscopy (EGD) is a procedure to examine the lining of the esophagus, stomach, and first part of the small intestine (duodenum). This procedure is done to check for problems such as inflammation, bleeding, ulcers, or growths. During this procedure, a long, flexible, lighted tube with a camera attached (endoscope) is inserted down the throat. Tell a health care provider about:  Any allergies you have.  All medicines you are taking, including vitamins, herbs, eye drops, creams, and over-the-counter medicines.  Any problems you or family members have had with anesthetic medicines.  Any blood  disorders you have.  Any surgeries you have had.  Any medical conditions you have.  Whether you are pregnant or may be pregnant. What are the risks? Generally, this is a safe procedure. However, problems may occur, including:  Infection.  Bleeding.  A tear (perforation) in the esophagus, stomach, or duodenum.  Trouble breathing.  Excessive sweating.  Spasms of the larynx.  A slowed heartbeat.  Low blood pressure.  What happens before the procedure?  Follow instructions from your health care provider about eating or drinking restrictions.  Ask your health care provider about: ? Changing or stopping your regular medicines. This is especially important if you are taking diabetes medicines or blood thinners. ? Taking medicines such as aspirin and ibuprofen. These medicines can thin your blood. Do not take these medicines before your procedure if your health care provider instructs you not to.  Plan to have someone take you home after the procedure.  If you wear dentures, be ready to remove them before the procedure. What happens during the procedure?  To reduce your risk of infection, your health care team will wash or sanitize their hands.  An IV tube will be put in a vein in your hand or arm. You will get medicines and fluids through this tube.  You will be given one or more of the following: ? A medicine to help you relax (sedative). ? A medicine to numb the area (local anesthetic). This medicine may be sprayed into  your throat. It will make you feel more comfortable and keep you from gagging or coughing during the procedure. ? A medicine for pain.  A mouth guard may be placed in your mouth to protect your teeth and to keep you from biting on the endoscope.  You will be asked to lie on your left side.  The endoscope will be lowered down your throat into your esophagus, stomach, and duodenum.  Air will be put into the endoscope. This will help your health care  provider see better.  The lining of your esophagus, stomach, and duodenum will be examined.  Your health care provider may: ? Take a tissue sample so it can be looked at in a lab (biopsy). ? Remove growths. ? Remove objects (foreign bodies) that are stuck. ? Treat any bleeding with medicines or other devices that stop tissue from bleeding. ? Widen (dilate) or stretch narrowed areas of your esophagus and stomach.  The endoscope will be taken out. The procedure may vary among health care providers and hospitals. What happens after the procedure?  Your blood pressure, heart rate, breathing rate, and blood oxygen level will be monitored often until the medicines you were given have worn off.  Do not eat or drink anything until the numbing medicine has worn off and your gag reflex has returned. This information is not intended to replace advice given to you by your health care provider. Make sure you discuss any questions you have with your health care provider. Document Released: 02/09/2005 Document Revised: 03/16/2016 Document Reviewed: 09/02/2015 Elsevier Interactive Patient Education  2018 [employer]. Esophagogastroduodenoscopy, Care After Refer to this sheet in the next few weeks. These instructions provide you with information about caring for yourself after your procedure. Your health care provider may also give you more specific instructions. Your treatment has been planned according to current medical practices, but problems sometimes occur. Call your health care provider if you have any problems or questions after your procedure. What can I expect after the procedure? After the procedure, it is common to have:  A sore throat.  Nausea.  Bloating.  Dizziness.  Fatigue.  Follow these instructions at home:  Do not eat or drink anything until the numbing medicine (local anesthetic) has worn off and your gag reflex has returned. You will know that the local anesthetic has worn  off when you can swallow comfortably.  Do not drive for 24 hours if you received a medicine to help you relax (sedative).  If your health care provider took a tissue sample for testing during the procedure, make sure to get your test results. This is your responsibility. Ask your health care provider or the department performing the test when your results will be ready.  Keep all follow-up visits as told by your health care provider. This is important. Contact a health care provider if:  You cannot stop coughing.  You are not urinating.  You are urinating less than usual. Get help right away if:  You have trouble swallowing.  You cannot eat or drink.  You have throat or chest pain that gets worse.  You are dizzy or light-headed.  You faint.  You have nausea or vomiting.  You have chills.  You have a fever.  You have severe abdominal pain.  You have black, tarry, or bloody stools. This information is not intended to replace advice given to you by your health care provider. Make sure you discuss any questions you have with your health care  provider. Document Released: 09/25/2012 Document Revised: 03/16/2016 Document Reviewed: 09/02/2015 Elsevier Interactive Patient Education  2018 [place] Anesthesia is a term that refers to techniques, procedures, and medicines that help a person stay safe and comfortable during a medical procedure. Monitored anesthesia care, or sedation, is one type of anesthesia. Your anesthesia specialist may recommend sedation if you will be having a procedure that does not require you to be unconscious, such as:  Cataract surgery.  A dental procedure.  A biopsy.  A colonoscopy.  During the procedure, you may receive a medicine to help you relax (sedative). There are three levels of sedation:  Mild sedation. At this level, you may feel awake and relaxed. You will be able to follow directions.  Moderate  sedation. At this level, you will be sleepy. You may not remember the procedure.  Deep sedation. At this level, you will be asleep. You will not remember the procedure.  The more medicine you are given, the deeper your level of sedation will be. Depending on how you respond to the procedure, the anesthesia specialist may change your level of sedation or the type of anesthesia to fit your needs. An anesthesia specialist will monitor you closely during the procedure. Let your health care provider know about:  Any allergies you have.  All medicines you are taking, including vitamins, herbs, eye drops, creams, and over-the-counter medicines.  Any use of steroids (by mouth or as a cream).  Any problems you or family members have had with sedatives and anesthetic medicines.  Any blood disorders you have.  Any surgeries you have had.  Any medical conditions you have, such as sleep apnea.  Whether you are pregnant or may be pregnant.  Any use of cigarettes, alcohol, or street drugs. What are the risks? Generally, this is a safe procedure. However, problems may occur, including:  Getting too much medicine (oversedation).  Nausea.  Allergic reaction to medicines.  Trouble breathing. If this happens, a breathing tube may be used to help with breathing. It will be removed when you are awake and breathing on your own.  Heart trouble.  Lung trouble.  Before the procedure Staying hydrated Follow instructions from your health care provider about hydration, which may include:  Up to 2 hours before the procedure - you may continue to drink clear liquids, such as water, clear fruit juice, black coffee, and plain tea.  Eating and drinking restrictions Follow instructions from your health care provider about eating and drinking, which may include:  8 hours before the procedure - stop eating heavy meals or foods such as meat, fried foods, or fatty foods.  6 hours before the procedure -  stop eating light meals or foods, such as toast or cereal.  6 hours before the procedure - stop drinking milk or drinks that contain milk.  2 hours before the procedure - stop drinking clear liquids.  Medicines Ask your health care provider about:  Changing or stopping your regular medicines. This is especially important if you are taking diabetes medicines or blood thinners.  Taking medicines such as aspirin and ibuprofen. These medicines can thin your blood. Do not take these medicines before your procedure if your health care provider instructs you not to.  Tests and exams  You will have a physical exam.  You may have blood tests done to show: ? How well your kidneys and liver are working. ? How well your blood can clot.  General instructions  Plan to  have someone take you home from the hospital or clinic.  If you will be going home right after the procedure, plan to have someone with you for 24 hours.  What happens during the procedure?  Your blood pressure, heart rate, breathing, level of pain and overall condition will be monitored.  An IV tube will be inserted into one of your veins.  Your anesthesia specialist will give you medicines as needed to keep you comfortable during the procedure. This may mean changing the level of sedation.  The procedure will be performed. After the procedure  Your blood pressure, heart rate, breathing rate, and blood oxygen level will be monitored until the medicines you were given have worn off.  Do not drive for 24 hours if you received a sedative.  You may: ? Feel sleepy, clumsy, or nauseous. ? Feel forgetful about what happened after the procedure. ? Have a sore throat if you had a breathing tube during the procedure. ? Vomit. This information is not intended to replace advice given to you by your health care provider. Make sure you discuss any questions you have with your health care provider. Document Released: 07/05/2005  Document Revised: 03/17/2016 Document Reviewed: 01/30/2016 Elsevier Interactive Patient Education  2018 [place], Care After These instructions provide you with information about caring for yourself after your procedure. Your health care provider may also give you more specific instructions. Your treatment has been planned according to current medical practices, but problems sometimes occur. Call your health care provider if you have any problems or questions after your procedure. What can I expect after the procedure? After your procedure, it is common to:  Feel sleepy for several hours.  Feel clumsy and have poor balance for several hours.  Feel forgetful about what happened after the procedure.  Have poor judgment for several hours.  Feel nauseous or vomit.  Have a sore throat if you had a breathing tube during the procedure.  Follow these instructions at home: For at least 24 hours after the procedure:   Do not: ? Participate in activities in which you could fall or become injured. ? Drive. ? Use heavy machinery. ? Drink alcohol. ? Take sleeping pills or medicines that cause drowsiness. ? Make important decisions or sign legal documents. ? Take care of children on your own.  Rest. Eating and drinking  Follow the diet that is recommended by your health care provider.  If you vomit, drink water, juice, or soup when you can drink without vomiting.  Make sure you have little or no nausea before eating solid foods. General instructions  Have a responsible adult stay with you until you are awake and alert.  Take over-the-counter and prescription medicines only as told by your health care provider.  If you smoke, do not smoke without supervision.  Keep all follow-up visits as told by your health care provider. This is important. Contact a health care provider if:  You keep feeling nauseous or you keep vomiting.  You feel  light-headed.  You develop a rash.  You have a fever. Get help right away if:  You have trouble breathing. This information is not intended to replace advice given to you by your health care provider. Make sure you discuss any questions you have with your health care provider. Document Released: 01/30/2016 Document Revised: 05/31/2016 Document Reviewed: 01/30/2016 Elsevier Interactive Patient Education  [employer].

## 2018-02-22 ENCOUNTER — Encounter (HOSPITAL_COMMUNITY): Payer: Self-pay

## 2018-02-22 ENCOUNTER — Encounter (HOSPITAL_COMMUNITY)
Admission: RE | Admit: 2018-02-22 | Discharge: 2018-02-22 | Disposition: A | Discharge status: Home / Self Care | Payer: Medicare Other | Source: Ambulatory Visit | Attending: Internal Medicine | Admitting: Internal Medicine

## 2018-02-22 ENCOUNTER — Other Ambulatory Visit: Payer: Self-pay

## 2018-02-22 DIAGNOSIS — I4891 Unspecified atrial fibrillation: Secondary | ICD-10-CM | POA: Diagnosis not present

## 2018-02-22 DIAGNOSIS — E1142 Type 2 diabetes mellitus with diabetic polyneuropathy: Secondary | ICD-10-CM | POA: Diagnosis not present

## 2018-02-22 DIAGNOSIS — K449 Diaphragmatic hernia without obstruction or gangrene: Secondary | ICD-10-CM | POA: Diagnosis not present

## 2018-02-22 DIAGNOSIS — K900 Celiac disease: Secondary | ICD-10-CM | POA: Diagnosis not present

## 2018-02-22 DIAGNOSIS — Z951 Presence of aortocoronary bypass graft: Secondary | ICD-10-CM | POA: Diagnosis not present

## 2018-02-22 DIAGNOSIS — Z79899 Other long term (current) drug therapy: Secondary | ICD-10-CM | POA: Diagnosis not present

## 2018-02-22 DIAGNOSIS — Z89612 Acquired absence of left leg above knee: Secondary | ICD-10-CM | POA: Diagnosis not present

## 2018-02-22 DIAGNOSIS — Z7901 Long term (current) use of anticoagulants: Secondary | ICD-10-CM | POA: Diagnosis not present

## 2018-02-22 DIAGNOSIS — R933 Abnormal findings on diagnostic imaging of other parts of digestive tract: Secondary | ICD-10-CM | POA: Diagnosis present

## 2018-02-22 DIAGNOSIS — E1151 Type 2 diabetes mellitus with diabetic peripheral angiopathy without gangrene: Secondary | ICD-10-CM | POA: Diagnosis not present

## 2018-02-22 DIAGNOSIS — Z882 Allergy status to sulfonamides status: Secondary | ICD-10-CM | POA: Diagnosis not present

## 2018-02-22 DIAGNOSIS — Z8249 Family history of ischemic heart disease and other diseases of the circulatory system: Secondary | ICD-10-CM | POA: Diagnosis not present

## 2018-02-22 DIAGNOSIS — I509 Heart failure, unspecified: Secondary | ICD-10-CM | POA: Diagnosis not present

## 2018-02-22 DIAGNOSIS — I2510 Atherosclerotic heart disease of native coronary artery without angina pectoris: Secondary | ICD-10-CM | POA: Diagnosis not present

## 2018-02-22 DIAGNOSIS — I252 Old myocardial infarction: Secondary | ICD-10-CM | POA: Diagnosis not present

## 2018-02-22 DIAGNOSIS — M1712 Unilateral primary osteoarthritis, left knee: Secondary | ICD-10-CM | POA: Diagnosis not present

## 2018-02-22 DIAGNOSIS — K921 Melena: Secondary | ICD-10-CM | POA: Diagnosis not present

## 2018-02-22 DIAGNOSIS — K315 Obstruction of duodenum: Secondary | ICD-10-CM | POA: Diagnosis not present

## 2018-02-22 DIAGNOSIS — I110 Hypertensive heart disease with heart failure: Secondary | ICD-10-CM | POA: Diagnosis not present

## 2018-02-22 DIAGNOSIS — Z7982 Long term (current) use of aspirin: Secondary | ICD-10-CM | POA: Diagnosis not present

## 2018-02-22 DIAGNOSIS — Z888 Allergy status to other drugs, medicaments and biological substances status: Secondary | ICD-10-CM | POA: Diagnosis not present

## 2018-02-22 DIAGNOSIS — Z880 Allergy status to penicillin: Secondary | ICD-10-CM | POA: Diagnosis not present

## 2018-02-22 LAB — BASIC METABOLIC PANEL
Anion gap: 10 (ref 5–15)
BUN: 21 mg/dL — ABNORMAL HIGH (ref 6–20)
CALCIUM: 7.5 mg/dL — AB (ref 8.9–10.3)
CHLORIDE: 112 mmol/L — AB (ref 101–111)
CO2: 17 mmol/L — AB (ref 22–32)
Creatinine, Ser: 1.17 mg/dL (ref 0.61–1.24)
GFR calc non Af Amer: 57 mL/min — ABNORMAL LOW (ref >60)
Glucose, Bld: 101 mg/dL — ABNORMAL HIGH (ref 65–99)
Potassium: 4 mmol/L (ref 3.5–5.1)
Sodium: 139 mmol/L (ref 135–145)

## 2018-02-22 LAB — CBC
HEMATOCRIT: 25.8 % — AB (ref 39.0–52.0)
HEMOGLOBIN: 8.6 g/dL — AB (ref 13.0–17.0)
MCH: 30.4 pg (ref 26.0–34.0)
MCHC: 33.3 g/dL (ref 30.0–36.0)
MCV: 91.2 fL (ref 78.0–100.0)
Platelets: 330 10*3/uL (ref 150–400)
RBC: 2.83 MIL/uL — ABNORMAL LOW (ref 4.22–5.81)
RDW: 15.7 % — ABNORMAL HIGH (ref 11.5–15.5)
WBC: 6.8 10*3/uL (ref 4.0–10.5)

## 2018-02-25 ENCOUNTER — Encounter (HOSPITAL_COMMUNITY): Payer: Self-pay | Admitting: Emergency Medicine

## 2018-02-25 ENCOUNTER — Ambulatory Visit (HOSPITAL_COMMUNITY)
Admission: RE | Admit: 2018-02-25 | Discharge: 2018-02-25 | Disposition: A | Discharge status: Home / Self Care | Payer: Medicare Other | Source: Ambulatory Visit | Attending: Internal Medicine | Admitting: Internal Medicine

## 2018-02-25 ENCOUNTER — Ambulatory Visit (HOSPITAL_COMMUNITY): Payer: Medicare Other | Admitting: Anesthesiology

## 2018-02-25 ENCOUNTER — Encounter (HOSPITAL_COMMUNITY)
Admission: RE | Disposition: A | Discharge status: Home / Self Care | Payer: Self-pay | Source: Ambulatory Visit | Attending: Internal Medicine

## 2018-02-25 ENCOUNTER — Other Ambulatory Visit: Payer: Self-pay

## 2018-02-25 DIAGNOSIS — K449 Diaphragmatic hernia without obstruction or gangrene: Secondary | ICD-10-CM | POA: Insufficient documentation

## 2018-02-25 DIAGNOSIS — Z882 Allergy status to sulfonamides status: Secondary | ICD-10-CM | POA: Insufficient documentation

## 2018-02-25 DIAGNOSIS — I2510 Atherosclerotic heart disease of native coronary artery without angina pectoris: Secondary | ICD-10-CM | POA: Insufficient documentation

## 2018-02-25 DIAGNOSIS — E1151 Type 2 diabetes mellitus with diabetic peripheral angiopathy without gangrene: Secondary | ICD-10-CM | POA: Insufficient documentation

## 2018-02-25 DIAGNOSIS — K921 Melena: Secondary | ICD-10-CM | POA: Insufficient documentation

## 2018-02-25 DIAGNOSIS — Z7901 Long term (current) use of anticoagulants: Secondary | ICD-10-CM | POA: Insufficient documentation

## 2018-02-25 DIAGNOSIS — Z880 Allergy status to penicillin: Secondary | ICD-10-CM | POA: Insufficient documentation

## 2018-02-25 DIAGNOSIS — K6389 Other specified diseases of intestine: Secondary | ICD-10-CM

## 2018-02-25 DIAGNOSIS — K228 Other specified diseases of esophagus: Secondary | ICD-10-CM

## 2018-02-25 DIAGNOSIS — I4891 Unspecified atrial fibrillation: Secondary | ICD-10-CM | POA: Diagnosis not present

## 2018-02-25 DIAGNOSIS — I110 Hypertensive heart disease with heart failure: Secondary | ICD-10-CM | POA: Insufficient documentation

## 2018-02-25 DIAGNOSIS — Z8249 Family history of ischemic heart disease and other diseases of the circulatory system: Secondary | ICD-10-CM | POA: Insufficient documentation

## 2018-02-25 DIAGNOSIS — Z89612 Acquired absence of left leg above knee: Secondary | ICD-10-CM | POA: Insufficient documentation

## 2018-02-25 DIAGNOSIS — K315 Obstruction of duodenum: Secondary | ICD-10-CM | POA: Diagnosis not present

## 2018-02-25 DIAGNOSIS — K900 Celiac disease: Secondary | ICD-10-CM | POA: Insufficient documentation

## 2018-02-25 DIAGNOSIS — K3189 Other diseases of stomach and duodenum: Secondary | ICD-10-CM

## 2018-02-25 DIAGNOSIS — Z7982 Long term (current) use of aspirin: Secondary | ICD-10-CM | POA: Insufficient documentation

## 2018-02-25 DIAGNOSIS — I252 Old myocardial infarction: Secondary | ICD-10-CM | POA: Insufficient documentation

## 2018-02-25 DIAGNOSIS — M1712 Unilateral primary osteoarthritis, left knee: Secondary | ICD-10-CM | POA: Insufficient documentation

## 2018-02-25 DIAGNOSIS — Z888 Allergy status to other drugs, medicaments and biological substances status: Secondary | ICD-10-CM | POA: Insufficient documentation

## 2018-02-25 DIAGNOSIS — Z951 Presence of aortocoronary bypass graft: Secondary | ICD-10-CM | POA: Insufficient documentation

## 2018-02-25 DIAGNOSIS — I509 Heart failure, unspecified: Secondary | ICD-10-CM | POA: Insufficient documentation

## 2018-02-25 DIAGNOSIS — Z79899 Other long term (current) drug therapy: Secondary | ICD-10-CM | POA: Insufficient documentation

## 2018-02-25 DIAGNOSIS — E1142 Type 2 diabetes mellitus with diabetic polyneuropathy: Secondary | ICD-10-CM | POA: Insufficient documentation

## 2018-02-25 HISTORY — PX: ESOPHAGOGASTRODUODENOSCOPY (EGD) WITH PROPOFOL: SHX5813

## 2018-02-25 LAB — GLUCOSE, CAPILLARY
Glucose-Capillary: 93 mg/dL (ref 65–99)
Glucose-Capillary: 95 mg/dL (ref 65–99)

## 2018-02-25 SURGERY — ESOPHAGOGASTRODUODENOSCOPY (EGD) WITH PROPOFOL
Anesthesia: General

## 2018-02-25 MED ORDER — LACTATED RINGERS IV SOLN
INTRAVENOUS | Status: DC
  Administered 2018-02-25: 09:00:00 via INTRAVENOUS

## 2018-02-25 MED ORDER — LACTATED RINGERS IV SOLN: INTRAVENOUS | Status: DC

## 2018-02-25 MED ORDER — CHLORHEXIDINE GLUCONATE CLOTH 2 % EX PADS: 6.0000 | MEDICATED_PAD | Freq: Once | CUTANEOUS | Status: DC

## 2018-02-25 MED ORDER — PROPOFOL 500 MG/50ML IV EMUL
INTRAVENOUS | Status: DC | PRN
  Administered 2018-02-25: 125 ug/kg/min via INTRAVENOUS

## 2018-02-25 MED ORDER — PROPOFOL 10 MG/ML IV BOLUS
INTRAVENOUS | Status: DC | PRN
  Administered 2018-02-25 (×2): 20 mg via INTRAVENOUS

## 2018-02-25 NOTE — Discharge Instructions (Signed)
Upper Endoscopy, Care After Refer to this sheet in the next few weeks. These instructions provide you with information about caring for yourself after your procedure. Your health care provider may also give you more specific instructions. Your treatment has been planned according to current medical practices, but problems sometimes occur. Call your health care provider if you have any problems or questions after your procedure. What can I expect after the procedure? After the procedure, it is common to have:  A sore throat.  Bloating.  Nausea.  Follow these instructions at home:  Follow instructions from your health care provider about what to eat or drink after your procedure.  Return to your normal activities as told by your health care provider. Ask your health care provider what activities are safe for you.  Take over-the-counter and prescription medicines only as told by your health care provider.  Do not drive for 24 hours if you received a sedative.  Keep all follow-up visits as told by your health care provider. This is important. Contact a health care provider if:  You have a sore throat that lasts longer than one day.  You have trouble swallowing. Get help right away if:  You have a fever.  You vomit blood or your vomit looks like coffee grounds.  You have bloody, black, or tarry stools.  You have a severe sore throat or you cannot swallow.  You have difficulty breathing.  You have severe pain in your chest or belly. This information is not intended to replace advice given to you by your health care provider. Make sure you discuss any questions you have with your health care provider. Document Released: 04/09/2012 Document Revised: 03/16/2016 Document Reviewed: 07/22/2015 Elsevier Interactive Patient Education  2018 [place] on 02/26/2018. Resume other medications as before. Resume usual diet. No driving for 24 hours. Physician will call  with biopsy results.

## 2018-02-25 NOTE — Anesthesia Procedure Notes (Signed)
Procedure Name: MAC Date/Time: 02/25/2018 9:55 AM Performed by: Vista Deck, CRNA Pre-anesthesia Checklist: Patient identified, Emergency Drugs available, Suction available, Timeout performed and Patient being monitored Patient Re-evaluated:Patient Re-evaluated prior to induction Oxygen Delivery Method: Non-rebreather mask

## 2018-02-25 NOTE — Anesthesia Preprocedure Evaluation (Addendum)
Anesthesia Evaluation  Patient identified by MRN, date of birth, ID band Patient awake    Reviewed: Allergy & Precautions, H&P , NPO status , Patient's Chart, lab work & pertinent test results, reviewed documented beta blocker date and time   Airway Mallampati: I  TM Distance: >3 FB    Comment: H/o Jaw excision in past Dental   Pulmonary neg pulmonary ROS,    breath sounds clear to auscultation       Cardiovascular hypertension, On Medications + Past MI, + Peripheral Vascular Disease and +CHF  negative cardio ROS  + dysrhythmias Atrial Fibrillation II Rhythm:regular Rate:Normal     Neuro/Psych  Neuromuscular disease negative neurological ROS  negative psych ROS   GI/Hepatic negative GI ROS, Neg liver ROS,   Endo/Other  negative endocrine ROSdiabetes, Type 2  Renal/GU Renal diseasenegative Renal ROS     Musculoskeletal   Abdominal   Peds  Hematology   Anesthesia Other Findings   Reproductive/Obstetrics negative OB ROS                            Anesthesia Physical Anesthesia Plan  ASA: III  Anesthesia Plan: General   Post-op Pain Management:    Induction:   PONV Risk Score and Plan:   Airway Management Planned:   Additional Equipment:   Intra-op Plan:   Post-operative Plan:   Informed Consent: I have reviewed the patients History and Physical, chart, labs and discussed the procedure including the risks, benefits and alternatives for the proposed anesthesia with the patient or authorized representative who has indicated his/her understanding and acceptance.     Plan Discussed with: CRNA  Anesthesia Plan Comments:         Anesthesia Quick Evaluation

## 2018-02-25 NOTE — Anesthesia Postprocedure Evaluation (Signed)
Anesthesia Post Note  Patient: [name]  Procedure(s) Performed: ESOPHAGOGASTRODUODENOSCOPY (EGD) WITH PROPOFOL (N/A )  Patient location during evaluation: Short Stay Anesthesia Type: General Level of consciousness: awake and alert Pain management: satisfactory to patient Vital Signs Assessment: post-procedure vital signs reviewed and stable Respiratory status: spontaneous breathing Cardiovascular status: stable Postop Assessment: no apparent nausea or vomiting Anesthetic complications: no     Last Vitals:  Vitals:   02/25/18 1042 02/25/18 1052  BP: (!) 148/67 139/66  Pulse: (!) 52 (!) 54  Resp: 13 18  Temp:  36.7 C  SpO2: 97%     Last Pain:  Vitals:   02/25/18 1052  TempSrc: Oral  PainSc: 0-No pain                 Candiss Galeana

## 2018-02-25 NOTE — H&P (Signed)
[name] is an 82 y.o. male.   Chief Complaint: Patient is here for push enteroscopy and jejunal biopsies. HPI: Patient is 82 year old Caucasian male with history of atrial fibrillation who was on warfarin with supratherapeutic INR and presented with melena and anemia week before last.  EGD did not reveal source of bleeding.  Therefore small bowel given capsule study was obtained.  This study revealed scalloping to jejunal mucosa suspicious for celiac disease.  Therefore celiac antibody panel was obtained. Tissue transglutaminase IgA antibody was positive as well as antigliadin antibody IgA and antigliadin antibody IgG. He is therefore returning for push enteroscopy with excisional biopsy to confirm the diagnosis before he is begun on gluten-free diet. Patient's granddaughter was diagnosed with celiac disease at age 58.  He does not have any other affected family members with celiac disease.  Past Medical History:  Diagnosis Date  . Arteriosclerotic cardiovascular disease (ASCVD)    CABG in 1990s; 2002 total obstruction of LAD, CX and RCA with patent grafts and nl EF; Stress nuc. 2008 - mild LV dilation; normal EF; questionable small anteroapical scar; no ischemia  . Arthritis    "left knee" (11/08/2015)  . Atrial fibrillation ([place])   . Cellulitis 11/08/2015   "both feet"  . Chronic anticoagulation   . Dental crowns present   . Diabetic peripheral neuropathy (HCC)    bilateral lower leg, hands  . History of blood transfusion    "related to my leg surgery?"  . Hypertension    states under control with meds., has been on med. x "long time"  . Iron deficiency anemia   . Jaw cancer ([place]) 1990s   "squamous cell"  . Myocardial infarction ([place]) 1990s   "after my jaw OR"  . Pedal edema    "not a lot", per pt.  . Peripheral vascular disease ([place])   . Presence of retained hardware 07/2015   failed hardware jaw  . Runny nose 07/27/2015   clear drainage, per pt.  . Type II diabetes  mellitus ([place])    "diet controlled" (11/08/2015)    Past Surgical History:  Procedure Laterality Date  . AMPUTATION Left 11/11/2015   Procedure: LEFT ABOVE KNEE AMPUTATION;  Surgeon: Angelia Mould, MD;  Location: [facility];  Service: Vascular;  Laterality: Left;  . CARDIAC CATHETERIZATION  1990s; 04/16/2001  . CATARACT EXTRACTION W/ INTRAOCULAR LENS  IMPLANT, BILATERAL Bilateral 09/2015  . COLONOSCOPY N/A 11/27/2014   Procedure: COLONOSCOPY;  Surgeon: Rogene Houston, MD;  [facility];  Service: Endoscopy;  Laterality: N/A;  830  . CORONARY ARTERY BYPASS GRAFT  1990's   "CABG X4"  . ESOPHAGOGASTRODUODENOSCOPY N/A 02/14/2018   Procedure: ESOPHAGOGASTRODUODENOSCOPY (EGD);  Surgeon: Rogene Houston, MD;  [facility];  Service: Endoscopy;  Laterality: N/A;  . FRACTURE SURGERY    . GIVENS CAPSULE STUDY N/A 02/15/2018   Procedure: GIVENS CAPSULE STUDY;  Surgeon: Rogene Houston, MD;  [facility];  Service: Endoscopy;  Laterality: N/A;  . I&D EXTREMITY Left 11/10/2015   Procedure: INCISION AND DRAINAGE LEFT FOOT, AMPUTATION OF LEFT THIRD TOE;  Surgeon: Conrad Ballantine, MD;  Location: [facility];  Service: Vascular;  Laterality: Left;  . INCISION AND DRAINAGE ABSCESS Left 06/16/2005   wide exc. abscess 5th toe  . MANDIBULAR HARDWARE REMOVAL Left 08/02/2015   Procedure:  HARDWARE REMOVAL TWO MANDIBULAR SCREWS;  Surgeon: Jannette Fogo, DDS;  Location: [facility];  Service: Oral Surgery;  Laterality: Left;  .  ORIF TIBIA FRACTURE Left ~ 1970  . PERIPHERAL VASCULAR CATHETERIZATION N/A 08/14/2016   Procedure: Abdominal Aortogram w/Lower Extremity;  Surgeon: Angelia Mould, MD;  Location: [facility];  Service: Cardiovascular;  Laterality: N/A;  . SQUAMOUS CELL CARCINOMA EXCISION Left 1993   "took my jaw out; got cadavar in there now"  . TONSILLECTOMY      Family History  Problem Relation Age of Onset  . Heart disease Mother    Social History:   reports that he has never smoked. He has never used smokeless tobacco. He reports that he does not drink alcohol or use drugs.  Allergies:  Allergies  Allergen Reactions  . Lipitor [Atorvastatin] Other (See Comments)    SEVERE HEADACHE  . Simvastatin Other (See Comments)    MUSCLE ACHES  . Penicillins Rash    Has patient had a PCN reaction causing immediate rash, facial/tongue/throat swelling, SOB or lightheadedness with hypotension: Yes Has patient had a PCN reaction causing severe rash involving mucus membranes or skin necrosis: No Has patient had a PCN reaction that required hospitalization No Has patient had a PCN reaction occurring within the last 10 years: No If all of the above answers are "NO", then may proceed with Cephalosporin use.   . Sulfa Antibiotics Rash    Tolerates silver sulfadiazine cream at home    Medications Prior to Admission  Medication Sig Dispense Refill  . amLODipine (NORVASC) 10 MG tablet Take 1 tablet (10 mg total) by mouth daily. 90 tablet 3  . aspirin EC 81 MG tablet Take 81 mg by mouth daily.    . Difluprednate (DUREZOL) 0.05 % EMUL Place 1 drop into both eyes 2 (two) times daily.     . feeding supplement (BOOST / RESOURCE BREEZE) LIQD Take 1 Container by mouth 3 (three) times daily between meals. (Patient taking differently: Take 1 Container by mouth 2 (two) times daily. ) 90 Container 4  . ferrous sulfate 325 (65 FE) MG tablet Take 65 mg by mouth daily with breakfast.    . lovastatin (MEVACOR) 10 MG tablet Take 10 mg by mouth daily.     . Multiple Vitamins-Minerals (CENTRUM SILVER PO) Take 1 tablet by mouth daily.      . nebivolol (BYSTOLIC) 10 MG tablet Take 10 mg by mouth daily.    Marland Kitchen nystatin cream (MYCOSTATIN) Apply 1 application topically 3 (three) times daily.    . Omega-3 Fatty Acids (FISH OIL) 1200 MG CAPS Take 1,200 mg by mouth daily.    . pantoprazole (PROTONIX) 40 MG tablet Take 1 tablet (40 mg total) by mouth daily. 30 tablet 0  .  Probiotic Product (PHILLIPS COLON HEALTH PO) Take 1 capsule by mouth daily.     . rivaroxaban (XARELTO) 20 MG TABS tablet Take 1 tablet (20 mg total) by mouth daily with supper. 30 tablet 0  . silver sulfADIAZINE (SILVADENE) 1 % cream Apply topically daily. (Patient taking differently: Apply 1 application topically daily. Applied to affected area of foot.) 50 g 0  . vitamin B-12 (CYANOCOBALAMIN) 1000 MCG tablet Take 2,000 mcg by mouth every morning.      Results for orders placed or performed during the hospital encounter of 02/25/18 (from the past 48 hour(s))  Glucose, capillary     Status: None   Collection Time: 02/25/18  8:36 AM  Result Value Ref Range   Glucose-Capillary 93 65 - 99 mg/dL   No results found.  ROS  Blood pressure 138/65, pulse (!) 50,  temperature 97.9 F (36.6 C), temperature source Oral, resp. rate (!) 23, SpO2 97 %. Physical Exam  Constitutional: He appears well-developed and well-nourished.  HENT:  Conjunctiva is pale.  Sclera nonicteric.  Neck: No thyromegaly present.  Cardiovascular:  Irregular rhythm normal S1 and S2.  Grade 2/6 short systolic murmur noted at left sternal border.  Respiratory: Effort normal and breath sounds normal.  GI: Soft. He exhibits no distension and no mass. There is no tenderness.  Musculoskeletal: He exhibits no edema.  Lymphadenopathy:    He has no cervical adenopathy.  Neurological: He is alert.  Skin: Skin is warm and dry.     Assessment/Plan Abnormal small bowel given capsule study revealing mucosal changes to jejunum concerning for celiac disease. Positive serology for celiac disease. Push enteroscopy with jejunal biopsies.  Hildred Laser, MD 02/25/2018, 9:49 AM

## 2018-02-25 NOTE — Op Note (Signed)
[facility] Patient Name: [name] Procedure Date: 02/25/2018 9:26 AM MRN: [id number] Date of Birth: Mar 17, 1936 Attending MD: Hildred Laser , MD CSN: [id number] Age: 82 Admit Type: Ambulatory Procedure:                Upper GI endoscopy; push enteroscopy; scope                            advanced to 130 cm. Indications:              Abnormal video capsule endoscopy Providers:                Hildred Laser, MD, Lurline Del, RN, Nelma Rothman,                            Technician Referring MD:              Medicines:                Lidocaine spray, Propofol per Anesthesia Complications:            No immediate complications. Estimated Blood Loss:     Estimated blood loss was minimal. Procedure:                Pre-Anesthesia Assessment:                           - Prior to the procedure, a History and Physical                            was performed, and patient medications and                            allergies were reviewed. The patient's tolerance of                            previous anesthesia was also reviewed. The risks                            and benefits of the procedure and the sedation                            options and risks were discussed with the patient.                            All questions were answered, and informed consent                            was obtained. Prior Anticoagulants: The patient                            last took Xarelto (rivaroxaban) 3 days prior to the                            procedure. ASA Grade Assessment: III - A patient  with severe systemic disease. After reviewing the                            risks and benefits, the patient was deemed in                            satisfactory condition to undergo the procedure.                           After obtaining informed consent, the endoscope was                            passed under direct vision. Throughout the                             procedure, the patient's blood pressure, pulse, and                            oxygen saturations were monitored continuously. The                            EC-[id number] ([id number]) scope was introduced through                            the mouth, and advanced to the proximal jejunum.                            The upper GI endoscopy was accomplished without                            difficulty. The patient tolerated the procedure                            well. Scope In: 10:02:05 AM Scope Out: 10:13:20 AM Total Procedure Duration: 0 hours 11 minutes 15 seconds  Findings:      The examined esophagus was normal.      The Z-line was irregular and was found 38 cm from the incisors.      A 2 cm hiatal hernia was present.      The entire examined stomach was normal.      The duodenal bulb was normal.      Diffuse mild mucosal changes characterized by flattening, longitudinal       markings and smoothness were found in the second portion of the       duodenum, in the third portion of the duodenum and in the fourth portion       of the duodenum. Biopsies were taken with a cold forceps for histology.       The pathology specimen was placed into Bottle Number 2.      An acquired benign-appearing, intrinsic mild stenosis was found in the       second portion of the duodenum and was traversed.      Diffuse mucosal changes characterized by flattening, longitudinal       markings, scalloping and smoothness were found in the jejunum. Biopsies       were taken with a  cold forceps for histology. The pathology specimen was       placed into Bottle Number 1. Impression:               - Normal esophagus.                           - Z-line irregular, 38 cm from the incisors.                           - 2 cm hiatal hernia.                           - Normal stomach.                           - Normal duodenal bulb.                           - Mucosal changes in the duodenum. Biopsied.                            - Acquired duodenal stenosis.                           - Mucosal changes in the jejunum. Biopsied.                           Comment: mucosal changes consistent with Celiac                            disease. Moderate Sedation:      Per Anesthesia Care Recommendation:           - Patient has a contact number available for                            emergencies. The signs and symptoms of potential                            delayed complications were discussed with the                            patient. Return to normal activities tomorrow.                            Written discharge instructions were provided to the                            patient.                           - Resume previous diet today.                           - Continue present medications.                           - Resume Xarelto (rivaroxaban)  at prior dose                            tomorrow.                           - Await pathology results. Procedure Code(s):        --- Professional ---                           [phone], Esophagogastroduodenoscopy, flexible,                            transoral; with biopsy, single or multiple Diagnosis Code(s):        --- Professional ---                           K22.8, Other specified diseases of esophagus                           K44.9, Diaphragmatic hernia without obstruction or                            gangrene                           K31.89, Other diseases of stomach and duodenum                           K63.89, Other specified diseases of intestine                           K31.5, Obstruction of duodenum                           R93.3, Abnormal findings on diagnostic imaging of                            other parts of digestive tract CPT copyright 2017 [employer]. All rights reserved. The codes documented in this report are preliminary and upon coder review may  be revised to meet current compliance requirements. Hildred Laser,  MD Hildred Laser, MD 02/25/2018 10:27:06 AM This report has been signed electronically. Number of Addenda: 0

## 2018-02-25 NOTE — Transfer of Care (Signed)
Immediate Anesthesia Transfer of Care Note  Patient: [name]  Procedure(s) Performed: ESOPHAGOGASTRODUODENOSCOPY (EGD) WITH PROPOFOL (N/A )  Patient Location: PACU  Anesthesia Type:General  Level of Consciousness: awake, alert  and patient cooperative  Airway & Oxygen Therapy: Patient Spontanous Breathing  Post-op Assessment: Report given to RN and Post -op Vital signs reviewed and stable  Post vital signs: Reviewed and stable  Last Vitals:  Vitals Value Taken Time  BP    Temp    Pulse    Resp    SpO2      Last Pain:  Vitals:   02/25/18 0838  TempSrc: Oral  PainSc: 0-No pain         Complications: No apparent anesthesia complications

## 2018-02-27 ENCOUNTER — Other Ambulatory Visit (INDEPENDENT_AMBULATORY_CARE_PROVIDER_SITE_OTHER): Payer: Self-pay | Admitting: Internal Medicine

## 2018-02-27 MED ORDER — FUROSEMIDE 20 MG PO TABS: 20.0000 mg | ORAL_TABLET | Freq: Every day | ORAL | 0 refills | Status: DC | PRN

## 2018-02-27 MED ORDER — POTASSIUM CHLORIDE ER 10 MEQ PO TBCR: 10.0000 meq | EXTENDED_RELEASE_TABLET | Freq: Every day | ORAL | 0 refills | Status: DC

## 2018-02-28 ENCOUNTER — Other Ambulatory Visit (INDEPENDENT_AMBULATORY_CARE_PROVIDER_SITE_OTHER): Payer: Self-pay | Admitting: *Deleted

## 2018-02-28 ENCOUNTER — Encounter (INDEPENDENT_AMBULATORY_CARE_PROVIDER_SITE_OTHER): Payer: Self-pay | Admitting: *Deleted

## 2018-02-28 DIAGNOSIS — K900 Celiac disease: Secondary | ICD-10-CM

## 2018-03-01 ENCOUNTER — Encounter (HOSPITAL_COMMUNITY): Payer: Self-pay | Admitting: Internal Medicine

## 2018-03-06 ENCOUNTER — Other Ambulatory Visit (INDEPENDENT_AMBULATORY_CARE_PROVIDER_SITE_OTHER): Payer: Self-pay | Admitting: *Deleted

## 2018-03-06 DIAGNOSIS — K921 Melena: Secondary | ICD-10-CM

## 2018-03-06 DIAGNOSIS — K922 Gastrointestinal hemorrhage, unspecified: Secondary | ICD-10-CM

## 2018-03-07 LAB — CBC
HCT: 27 % — ABNORMAL LOW (ref 38.5–50.0)
HEMOGLOBIN: 8.9 g/dL — AB (ref 13.2–17.1)
MCH: 29.1 pg (ref 27.0–33.0)
MCHC: 33 g/dL (ref 32.0–36.0)
MCV: 88.2 fL (ref 80.0–100.0)
MPV: 11.1 fL (ref 7.5–12.5)
Platelets: 382 10*3/uL (ref 140–400)
RBC: 3.06 10*6/uL — AB (ref 4.20–5.80)
RDW: 14.4 % (ref 11.0–15.0)
WBC: 5.4 10*3/uL (ref 3.8–10.8)

## 2018-03-07 LAB — BASIC METABOLIC PANEL
BUN: 16 mg/dL (ref 7–25)
CHLORIDE: 110 mmol/L (ref 98–110)
CO2: 24 mmol/L (ref 20–32)
CREATININE: 1.17 mg/dL (ref 0.70–1.18)
Calcium: 8 mg/dL — ABNORMAL LOW (ref 8.6–10.3)
GLUCOSE: 102 mg/dL — AB (ref 65–99)
Potassium: 4.1 mmol/L (ref 3.5–5.3)
Sodium: 143 mmol/L (ref 135–146)

## 2018-03-07 LAB — ALBUMIN: Albumin: 3.5 g/dL — ABNORMAL LOW (ref 3.6–5.1)

## 2018-03-26 ENCOUNTER — Inpatient Hospital Stay (HOSPITAL_COMMUNITY): Payer: Medicare Other

## 2018-03-26 ENCOUNTER — Encounter (HOSPITAL_COMMUNITY): Payer: Self-pay

## 2018-03-26 ENCOUNTER — Inpatient Hospital Stay (HOSPITAL_COMMUNITY)
Admission: EM | Admit: 2018-03-26 | Discharge: 2018-03-30 | DRG: 394 | Disposition: A | Discharge status: Home / Self Care | Payer: Medicare Other | Attending: Family Medicine | Admitting: Family Medicine

## 2018-03-26 ENCOUNTER — Other Ambulatory Visit: Payer: Self-pay

## 2018-03-26 DIAGNOSIS — I25810 Atherosclerosis of coronary artery bypass graft(s) without angina pectoris: Secondary | ICD-10-CM | POA: Diagnosis present

## 2018-03-26 DIAGNOSIS — D509 Iron deficiency anemia, unspecified: Secondary | ICD-10-CM | POA: Diagnosis present

## 2018-03-26 DIAGNOSIS — N179 Acute kidney failure, unspecified: Secondary | ICD-10-CM

## 2018-03-26 DIAGNOSIS — E785 Hyperlipidemia, unspecified: Secondary | ICD-10-CM | POA: Diagnosis present

## 2018-03-26 DIAGNOSIS — I252 Old myocardial infarction: Secondary | ICD-10-CM

## 2018-03-26 DIAGNOSIS — K648 Other hemorrhoids: Secondary | ICD-10-CM | POA: Diagnosis present

## 2018-03-26 DIAGNOSIS — K2960 Other gastritis without bleeding: Secondary | ICD-10-CM | POA: Diagnosis present

## 2018-03-26 DIAGNOSIS — Z882 Allergy status to sulfonamides status: Secondary | ICD-10-CM

## 2018-03-26 DIAGNOSIS — K6381 Dieulafoy lesion of intestine: Secondary | ICD-10-CM | POA: Diagnosis present

## 2018-03-26 DIAGNOSIS — Z7982 Long term (current) use of aspirin: Secondary | ICD-10-CM

## 2018-03-26 DIAGNOSIS — Z9841 Cataract extraction status, right eye: Secondary | ICD-10-CM

## 2018-03-26 DIAGNOSIS — K921 Melena: Secondary | ICD-10-CM | POA: Diagnosis present

## 2018-03-26 DIAGNOSIS — K900 Celiac disease: Secondary | ICD-10-CM | POA: Diagnosis present

## 2018-03-26 DIAGNOSIS — I482 Chronic atrial fibrillation: Secondary | ICD-10-CM | POA: Diagnosis present

## 2018-03-26 DIAGNOSIS — K2980 Duodenitis without bleeding: Secondary | ICD-10-CM | POA: Diagnosis present

## 2018-03-26 DIAGNOSIS — Z961 Presence of intraocular lens: Secondary | ICD-10-CM | POA: Diagnosis present

## 2018-03-26 DIAGNOSIS — D649 Anemia, unspecified: Secondary | ICD-10-CM | POA: Diagnosis not present

## 2018-03-26 DIAGNOSIS — Z89612 Acquired absence of left leg above knee: Secondary | ICD-10-CM

## 2018-03-26 DIAGNOSIS — D689 Coagulation defect, unspecified: Secondary | ICD-10-CM | POA: Diagnosis present

## 2018-03-26 DIAGNOSIS — R7989 Other specified abnormal findings of blood chemistry: Secondary | ICD-10-CM | POA: Diagnosis present

## 2018-03-26 DIAGNOSIS — E1151 Type 2 diabetes mellitus with diabetic peripheral angiopathy without gangrene: Secondary | ICD-10-CM | POA: Diagnosis present

## 2018-03-26 DIAGNOSIS — M1712 Unilateral primary osteoarthritis, left knee: Secondary | ICD-10-CM | POA: Diagnosis present

## 2018-03-26 DIAGNOSIS — Z8249 Family history of ischemic heart disease and other diseases of the circulatory system: Secondary | ICD-10-CM

## 2018-03-26 DIAGNOSIS — R319 Hematuria, unspecified: Secondary | ICD-10-CM | POA: Diagnosis not present

## 2018-03-26 DIAGNOSIS — D62 Acute posthemorrhagic anemia: Secondary | ICD-10-CM | POA: Diagnosis present

## 2018-03-26 DIAGNOSIS — I4821 Permanent atrial fibrillation: Secondary | ICD-10-CM | POA: Diagnosis present

## 2018-03-26 DIAGNOSIS — Z79899 Other long term (current) drug therapy: Secondary | ICD-10-CM

## 2018-03-26 DIAGNOSIS — K644 Residual hemorrhoidal skin tags: Secondary | ICD-10-CM | POA: Diagnosis present

## 2018-03-26 DIAGNOSIS — Z9842 Cataract extraction status, left eye: Secondary | ICD-10-CM | POA: Diagnosis not present

## 2018-03-26 DIAGNOSIS — K922 Gastrointestinal hemorrhage, unspecified: Secondary | ICD-10-CM | POA: Diagnosis not present

## 2018-03-26 DIAGNOSIS — Z7901 Long term (current) use of anticoagulants: Secondary | ICD-10-CM

## 2018-03-26 DIAGNOSIS — Z880 Allergy status to penicillin: Secondary | ICD-10-CM

## 2018-03-26 DIAGNOSIS — I10 Essential (primary) hypertension: Secondary | ICD-10-CM | POA: Diagnosis present

## 2018-03-26 DIAGNOSIS — T45515A Adverse effect of anticoagulants, initial encounter: Secondary | ICD-10-CM | POA: Diagnosis present

## 2018-03-26 DIAGNOSIS — R0602 Shortness of breath: Secondary | ICD-10-CM | POA: Diagnosis present

## 2018-03-26 DIAGNOSIS — E1142 Type 2 diabetes mellitus with diabetic polyneuropathy: Secondary | ICD-10-CM | POA: Diagnosis present

## 2018-03-26 DIAGNOSIS — Z888 Allergy status to other drugs, medicaments and biological substances status: Secondary | ICD-10-CM

## 2018-03-26 LAB — CBC
HCT: 20.3 % — ABNORMAL LOW (ref 39.0–52.0)
HEMATOCRIT: 13.8 % — AB (ref 39.0–52.0)
Hemoglobin: 4.2 g/dL — CL (ref 13.0–17.0)
Hemoglobin: 6.4 g/dL — CL (ref 13.0–17.0)
MCH: 29.2 pg (ref 26.0–34.0)
MCH: 28.7 pg (ref 26.0–34.0)
MCHC: 30.4 g/dL (ref 30.0–36.0)
MCHC: 31.5 g/dL (ref 30.0–36.0)
MCV: 95.8 fL (ref 78.0–100.0)
MCV: 91 fL (ref 78.0–100.0)
PLATELETS: 342 10*3/uL (ref 150–400)
PLATELETS: 316 10*3/uL (ref 150–400)
RBC: 1.44 MIL/uL — ABNORMAL LOW (ref 4.22–5.81)
RBC: 2.23 MIL/uL — AB (ref 4.22–5.81)
RDW: 19.6 % — AB (ref 11.5–15.5)
RDW: 20.9 % — AB (ref 11.5–15.5)
WBC: 14.5 10*3/uL — AB (ref 4.0–10.5)
WBC: 14.9 10*3/uL — AB (ref 4.0–10.5)

## 2018-03-26 LAB — HEMOGLOBIN AND HEMATOCRIT, BLOOD
HEMATOCRIT: 23 % — AB (ref 39.0–52.0)
HEMATOCRIT: 22.9 % — AB (ref 39.0–52.0)
HEMOGLOBIN: 7.4 g/dL — AB (ref 13.0–17.0)
Hemoglobin: 7.4 g/dL — ABNORMAL LOW (ref 13.0–17.0)

## 2018-03-26 LAB — PROTIME-INR
INR: 3.7
INR: 2.64
PROTHROMBIN TIME: 28 s — AB (ref 11.4–15.2)
Prothrombin Time: 36.4 seconds — ABNORMAL HIGH (ref 11.4–15.2)

## 2018-03-26 LAB — COMPREHENSIVE METABOLIC PANEL
ALBUMIN: 2.7 g/dL — AB (ref 3.5–5.0)
ALT: 18 U/L (ref 17–63)
ALT: 18 U/L (ref 17–63)
AST: 22 U/L (ref 15–41)
AST: 23 U/L (ref 15–41)
Albumin: 2.8 g/dL — ABNORMAL LOW (ref 3.5–5.0)
Alkaline Phosphatase: 100 U/L (ref 38–126)
Alkaline Phosphatase: 100 U/L (ref 38–126)
Anion gap: 13 (ref 5–15)
Anion gap: 10 (ref 5–15)
BILIRUBIN TOTAL: 0.5 mg/dL (ref 0.3–1.2)
BUN: 65 mg/dL — AB (ref 6–20)
BUN: 67 mg/dL — AB (ref 6–20)
CHLORIDE: 110 mmol/L (ref 101–111)
CHLORIDE: 115 mmol/L — AB (ref 101–111)
CO2: 13 mmol/L — ABNORMAL LOW (ref 22–32)
CO2: 16 mmol/L — AB (ref 22–32)
CREATININE: 1.74 mg/dL — AB (ref 0.61–1.24)
Calcium: 7.6 mg/dL — ABNORMAL LOW (ref 8.9–10.3)
Calcium: 7.9 mg/dL — ABNORMAL LOW (ref 8.9–10.3)
Creatinine, Ser: 1.55 mg/dL — ABNORMAL HIGH (ref 0.61–1.24)
GFR calc Af Amer: 41 mL/min — ABNORMAL LOW (ref >60)
GFR calc non Af Amer: 36 mL/min — ABNORMAL LOW (ref >60)
GFR, EST AFRICAN AMERICAN: 47 mL/min — AB (ref >60)
GFR, EST NON AFRICAN AMERICAN: 41 mL/min — AB (ref >60)
GLUCOSE: 203 mg/dL — AB (ref 65–99)
Glucose, Bld: 134 mg/dL — ABNORMAL HIGH (ref 65–99)
POTASSIUM: 4.7 mmol/L (ref 3.5–5.1)
POTASSIUM: 5.3 mmol/L — AB (ref 3.5–5.1)
SODIUM: 141 mmol/L (ref 135–145)
Sodium: 136 mmol/L (ref 135–145)
Total Bilirubin: 1.9 mg/dL — ABNORMAL HIGH (ref 0.3–1.2)
Total Protein: 5.6 g/dL — ABNORMAL LOW (ref 6.5–8.1)
Total Protein: 5.8 g/dL — ABNORMAL LOW (ref 6.5–8.1)

## 2018-03-26 LAB — PREPARE RBC (CROSSMATCH)

## 2018-03-26 LAB — MRSA PCR SCREENING: MRSA by PCR: NEGATIVE

## 2018-03-26 LAB — POC OCCULT BLOOD, ED: FECAL OCCULT BLD: POSITIVE — AB

## 2018-03-26 MED ORDER — PANTOPRAZOLE SODIUM 40 MG IV SOLR
40.0000 mg | Freq: Two times a day (BID) | INTRAVENOUS | Status: DC
  Administered 2018-03-29 – 2018-03-30 (×2): 40 mg via INTRAVENOUS
  Filled 2018-03-26 (×2): qty 40

## 2018-03-26 MED ORDER — SODIUM CHLORIDE 0.9 % IV SOLN
Freq: Once | INTRAVENOUS | Status: AC
  Administered 2018-03-26: 12:00:00 via INTRAVENOUS

## 2018-03-26 MED ORDER — SODIUM CHLORIDE 0.9 % IV SOLN
80.0000 mg | Freq: Once | INTRAVENOUS | Status: AC
  Administered 2018-03-26: 80 mg via INTRAVENOUS
  Filled 2018-03-26: qty 80

## 2018-03-26 MED ORDER — SODIUM CHLORIDE 0.9 % IV SOLN
INTRAVENOUS | Status: DC
  Administered 2018-03-26 – 2018-03-30 (×3): via INTRAVENOUS

## 2018-03-26 MED ORDER — SODIUM CHLORIDE 0.9 % IV SOLN
8.0000 mg/h | INTRAVENOUS | Status: AC
  Administered 2018-03-26 – 2018-03-29 (×6): 8 mg/h via INTRAVENOUS
  Filled 2018-03-26: qty 80
  Filled 2018-03-26: qty 40
  Filled 2018-03-26 (×7): qty 80

## 2018-03-26 MED ORDER — DIFLUPREDNATE 0.05 % OP EMUL
1.0000 [drp] | Freq: Two times a day (BID) | OPHTHALMIC | Status: DC
  Administered 2018-03-26 – 2018-03-27 (×4): 1 [drp] via OPHTHALMIC
  Filled 2018-03-26: qty 5

## 2018-03-26 MED ORDER — PROTHROMBIN COMPLEX CONC HUMAN 500 UNITS IV KIT
1958.0000 [IU] | PACK | Status: AC
  Administered 2018-03-26: 1958 [IU] via INTRAVENOUS
  Filled 2018-03-26: qty 1958

## 2018-03-26 MED ORDER — VITAMIN K1 10 MG/ML IJ SOLN
10.0000 mg | INTRAVENOUS | Status: AC
  Administered 2018-03-26: 10 mg via INTRAVENOUS
  Filled 2018-03-26: qty 1

## 2018-03-26 MED ORDER — HEPARIN SOD (PORK) LOCK FLUSH 100 UNIT/ML IV SOLN
INTRAVENOUS | Status: AC
  Filled 2018-03-26: qty 5

## 2018-03-26 MED ORDER — SODIUM CHLORIDE 0.9 % IV SOLN
10.0000 mL/h | Freq: Once | INTRAVENOUS | Status: AC
Start: 2018-03-26 — End: 2018-03-26
  Administered 2018-03-26: 10 mL/h via INTRAVENOUS

## 2018-03-26 MED ORDER — VITAMIN K1 10 MG/ML IJ SOLN
INTRAMUSCULAR | Status: AC
  Filled 2018-03-26: qty 1

## 2018-03-26 MED ORDER — TECHNETIUM TC 99M-LABELED RED BLOOD CELLS IV KIT
25.0000 | PACK | Freq: Once | INTRAVENOUS | Status: AC | PRN
  Administered 2018-03-26: 25.6 via INTRAVENOUS

## 2018-03-26 NOTE — Consult Note (Signed)
Reason for Consult: GI Bleed Referring Physician: Hospitalist  [name] is an 82 y.o. male.  HPI: Has been having black stools for a few days. Noticed he could not breath and decided to come to the ED. Hemoglobin on admission this morning 4.2. Has received 2 units of PRBCs. Plans to receive 2 more. POC occult blood in the ED was positive. Has received Prothrombin Complex Human x 1 dose IVPB. Also has received Vitamin K 10 mg IVPB.  732am PT/INR 28.0 and 2.64 Takes Xarelto and a Baby ASA for atrial fib. Previous on Warfarin for atrial fib. [facility] in [facility] with GI bleed. Presented with melena and anemia in the setting of supra therapeutic INR.  Underwent a Given's Capsule on 02/15/2018: No bleeding lesions identified in small bowel that was examined. Study was incomplete as capsule did not reach cecum.  Last colonoscopy in 2016 (chronic diarrhea, wt loss close to 50 pounds) Examination performed to cecum. No evidence of endoscopic colitis. Small polyp ablated via cold biopsy from transverse colon. Small internal and external hemorrhoids. Patient had single polyp removed and is tubular adenoma.   EGD 02/14/2018: a few localized, non-bleeding erosions were found in the gastric body and on the posterior wall of the stomach. There was no stigmata of recent bleeding. Patchy mild inflammation characterized by congestion (edema), erythema and granularity was found in the duodenal bulb. No critical stricture involving 2nd part of the duodenum. Past Medical History:  Diagnosis Date  . Arteriosclerotic cardiovascular disease (ASCVD)    CABG in 1990s; 2002 total obstruction of LAD, CX and RCA with patent grafts and nl EF; Stress nuc. 2008 - mild LV dilation; normal EF; questionable small anteroapical scar; no ischemia  . Arthritis    "left knee" (11/08/2015)  . Atrial fibrillation ([place])   . Cellulitis 11/08/2015   "both feet"  . Chronic anticoagulation   . Dental crowns present   .  Diabetic peripheral neuropathy (HCC)    bilateral lower leg, hands  . History of blood transfusion    "related to my leg surgery?"  . Hypertension    states under control with meds., has been on med. x "long time"  . Iron deficiency anemia   . Jaw cancer ([place]) 1990s   "squamous cell"  . Myocardial infarction ([place]) 1990s   "after my jaw OR"  . Pedal edema    "not a lot", per pt.  . Peripheral vascular disease ([place])   . Presence of retained hardware 07/2015   failed hardware jaw  . Runny nose 07/27/2015   clear drainage, per pt.  . Type II diabetes mellitus ([place])    "diet controlled" (11/08/2015)    Past Surgical History:  Procedure Laterality Date  . AMPUTATION Left 11/11/2015   Procedure: LEFT ABOVE KNEE AMPUTATION;  Surgeon: Angelia Mould, MD;  Location: [place];  Service: Vascular;  Laterality: Left;  . CARDIAC CATHETERIZATION  1990s; 04/16/2001  . CATARACT EXTRACTION W/ INTRAOCULAR LENS  IMPLANT, BILATERAL Bilateral 09/2015  . COLONOSCOPY N/A 11/27/2014   Procedure: COLONOSCOPY;  Surgeon: Rogene Houston, MD;  [facility];  Service: Endoscopy;  Laterality: N/A;  830  . CORONARY ARTERY BYPASS GRAFT  1990's   "CABG X4"  . ESOPHAGOGASTRODUODENOSCOPY N/A 02/14/2018   Procedure: ESOPHAGOGASTRODUODENOSCOPY (EGD);  Surgeon: Rogene Houston, MD;  [facility];  Service: Endoscopy;  Laterality: N/A;  . ESOPHAGOGASTRODUODENOSCOPY (EGD) WITH PROPOFOL N/A 02/25/2018   Procedure: ESOPHAGOGASTRODUODENOSCOPY (EGD) WITH PROPOFOL;  Surgeon:  Rogene Houston, MD;  [facility];  Service: Endoscopy;  Laterality: N/A;  . FRACTURE SURGERY    . GIVENS CAPSULE STUDY N/A 02/15/2018   Procedure: GIVENS CAPSULE STUDY;  Surgeon: Rogene Houston, MD;  [facility];  Service: Endoscopy;  Laterality: N/A;  . I&D EXTREMITY Left 11/10/2015   Procedure: INCISION AND DRAINAGE LEFT FOOT, AMPUTATION OF LEFT THIRD TOE;  Surgeon: Conrad Laurinburg, MD;  Location: [place];   Service: Vascular;  Laterality: Left;  . INCISION AND DRAINAGE ABSCESS Left 06/16/2005   wide exc. abscess 5th toe  . MANDIBULAR HARDWARE REMOVAL Left 08/02/2015   Procedure:  HARDWARE REMOVAL TWO MANDIBULAR SCREWS;  Surgeon: Jannette Fogo, DDS;  Location: [place];  Service: Oral Surgery;  Laterality: Left;  . ORIF TIBIA FRACTURE Left ~ 1970  . PERIPHERAL VASCULAR CATHETERIZATION N/A 08/14/2016   Procedure: Abdominal Aortogram w/Lower Extremity;  Surgeon: Angelia Mould, MD;  Location: [place];  Service: Cardiovascular;  Laterality: N/A;  . SQUAMOUS CELL CARCINOMA EXCISION Left 1993   "took my jaw out; got cadavar in there now"  . TONSILLECTOMY      Family History  Problem Relation Age of Onset  . Heart disease Mother     Social History:  reports that he has never smoked. He has never used smokeless tobacco. He reports that he does not drink alcohol or use drugs.  Allergies:  Allergies  Allergen Reactions  . Lipitor [Atorvastatin] Other (See Comments)    SEVERE HEADACHE  . Simvastatin Other (See Comments)    MUSCLE ACHES  . Penicillins Rash    Has patient had a PCN reaction causing immediate rash, facial/tongue/throat swelling, SOB or lightheadedness with hypotension: Yes Has patient had a PCN reaction causing severe rash involving mucus membranes or skin necrosis: No Has patient had a PCN reaction that required hospitalization No Has patient had a PCN reaction occurring within the last 10 years: No If all of the above answers are "NO", then may proceed with Cephalosporin use.   . Sulfa Antibiotics Rash    Tolerates silver sulfadiazine cream at home    Medications: I have reviewed the patient's current medications.  Results for orders placed or performed during the hospital encounter of 03/26/18 (from the past 48 hour(s))  Comprehensive metabolic panel     Status: Abnormal   Collection Time: 03/26/18  2:50 AM  Result Value Ref Range    Sodium 136 135 - 145 mmol/L   Potassium 4.7 3.5 - 5.1 mmol/L   Chloride 110 101 - 111 mmol/L   CO2 13 (L) 22 - 32 mmol/L   Glucose, Bld 203 (H) 65 - 99 mg/dL   BUN 65 (H) 6 - 20 mg/dL   Creatinine, Ser 1.74 (H) 0.61 - 1.24 mg/dL   Calcium 7.6 (L) 8.9 - 10.3 mg/dL   Total Protein 5.6 (L) 6.5 - 8.1 g/dL   Albumin 2.7 (L) 3.5 - 5.0 g/dL   AST 22 15 - 41 U/L   ALT 18 17 - 63 U/L   Alkaline Phosphatase 100 38 - 126 U/L   Total Bilirubin 0.5 0.3 - 1.2 mg/dL   GFR calc non Af Amer 36 (L) >60 mL/min   GFR calc Af Amer 41 (L) >60 mL/min    Comment: (NOTE) The eGFR has been calculated using the CKD EPI equation. This calculation has not been validated in all clinical situations. eGFR's persistently <60 mL/min signify possible Chronic Kidney Disease.  Anion gap 13 5 - 15    Comment: Performed at [facility], [address] [place], [place] [address]  CBC     Status: Abnormal   Collection Time: 03/26/18  2:50 AM  Result Value Ref Range   WBC 14.5 (H) 4.0 - 10.5 K/uL   RBC 1.44 (L) 4.22 - 5.81 MIL/uL   Hemoglobin 4.2 (LL) 13.0 - 17.0 g/dL    Comment: REPEATED TO VERIFY CRITICAL RESULT CALLED TO, READ BACK BY AND VERIFIED WITH: LONG,H @ 0345 ON 6.4.19 BY BOWMAN,L    HCT 13.8 (L) 39.0 - 52.0 %   MCV 95.8 78.0 - 100.0 fL   MCH 29.2 26.0 - 34.0 pg   MCHC 30.4 30.0 - 36.0 g/dL   RDW 19.6 (H) 11.5 - 15.5 %   Platelets 342 150 - 400 K/uL    Comment: Performed at [facility], [address], [place], [place] [address]  Type and screen [facility]     Status: None (Preliminary result)   Collection Time: 03/26/18  2:50 AM  Result Value Ref Range   ABO/RH(D) A POS    Antibody Screen NEG    Sample Expiration 03/29/2018    Unit Number [id number]    Blood Component Type RED CELLS,LR    Unit division 00    Status of Unit ISSUED    Transfusion Status OK TO TRANSFUSE    Crossmatch Result Compatible    Unit Number [id number]    Blood Component Type RED CELLS,LR    Unit  division 00    Status of Unit ISSUED    Transfusion Status OK TO TRANSFUSE    Crossmatch Result Compatible    Unit Number [id number]    Blood Component Type RED CELLS,LR    Unit division 00    Status of Unit ALLOCATED    Transfusion Status OK TO TRANSFUSE    Crossmatch Result      Compatible Performed at [facility], [address], [place], [place] [address]    Unit Number [id number]    Blood Component Type RED CELLS,LR    Unit division 00    Status of Unit ALLOCATED    Transfusion Status OK TO TRANSFUSE    Crossmatch Result Compatible   Protime-INR     Status: Abnormal   Collection Time: 03/26/18  2:50 AM  Result Value Ref Range   Prothrombin Time 36.4 (H) 11.4 - 15.2 seconds   INR 3.70     Comment: Performed at [facility], [address], [place], [place] [address]  Prepare RBC     Status: None   Collection Time: 03/26/18  2:50 AM  Result Value Ref Range   Order Confirmation      ORDER PROCESSED BY BLOOD BANK Performed at [facility], [address] [place], [place] [address]   POC occult blood, ED     Status: Abnormal   Collection Time: 03/26/18  3:26 AM  Result Value Ref Range   Fecal Occult Bld POSITIVE (A) NEGATIVE  Prepare RBC     Status: None   Collection Time: 03/26/18  3:59 AM  Result Value Ref Range   Order Confirmation      ORDER PROCESSED BY BLOOD BANK Performed at [facility], [address], [place], [place] [address]   Protime-INR     Status: Abnormal   Collection Time: 03/26/18  7:32 AM  Result Value Ref Range   Prothrombin Time 28.0 (H) 11.4 - 15.2 seconds   INR 2.64  Comment: Performed at [facility], [address], [place], [place] [address]    No results found.  ROS Blood pressure (!) 120/57, pulse 76, temperature 97.7 F (36.5 C), temperature source Oral, resp. rate 16, height 5' 8"  (1.727 m), weight 176 lb 5.9 oz (80 kg), SpO2 100 %. Physical Exam Alert and oriented. Skin warm and dry. Oral mucosa is moist.   . Sclera  anicteric, conjunctivae is pink. Thyroid not enlarged. No cervical lymphadenopathy. Lungs clear. Heart regular rate and rhythm.  Abdomen is soft. Bowel sounds are positive. No hepatomegaly. No abdominal masses felt. No tenderness.  No edema to lower extremities.  02 is going. Family in room.    Assessment/Plan: Probable UGI bleed/melena. Takes Xarelto and ASA daily.  Dr. Laural Golden is aware.    Keyana Guevara L Jaythen Hamme 03/26/2018, 10:51 AM

## 2018-03-26 NOTE — ED Provider Notes (Signed)
[facility] EMERGENCY DEPARTMENT Provider Note   CSN: [id number] Arrival date & time: 03/26/18  0234   History   Chief Complaint Chief Complaint  Patient presents with  . GI Bleeding   Pt seen with NP student, I performed history/physical/documentation  HPI [name] is a 82 y.o. male.  The history is provided by the patient and the spouse.  Weakness  This is a new problem. The current episode started 2 days ago. The problem has been gradually worsening. There was no focality noted. There has been no fever. Associated symptoms include shortness of breath. Pertinent negatives include no chest pain.  Patient with history of coronary disease, atrial fibrillation on Xarelto presents with weakness.  He reports over 2 days he has been having generalized weakness, fatigue and shortness of breath with any movement.  No chest pain.  No vomiting.  No abdominal pain.  He does report recent black stools, and has had a recent GI bleed.  He reports he was recently diagnosed with celiac disease  Past Medical History:  Diagnosis Date  . Arteriosclerotic cardiovascular disease (ASCVD)    CABG in 1990s; 2002 total obstruction of LAD, CX and RCA with patent grafts and nl EF; Stress nuc. 2008 - mild LV dilation; normal EF; questionable small anteroapical scar; no ischemia  . Arthritis    "left knee" (11/08/2015)  . Atrial fibrillation ([place])   . Cellulitis 11/08/2015   "both feet"  . Chronic anticoagulation   . Dental crowns present   . Diabetic peripheral neuropathy (HCC)    bilateral lower leg, hands  . History of blood transfusion    "related to my leg surgery?"  . Hypertension    states under control with meds., has been on med. x "long time"  . Iron deficiency anemia   . Jaw cancer ([place]) 1990s   "squamous cell"  . Myocardial infarction ([place]) 1990s   "after my jaw OR"  . Pedal edema    "not a lot", per pt.  . Peripheral vascular disease ([place])   . Presence of retained hardware 07/2015     failed hardware jaw  . Runny nose 07/27/2015   clear drainage, per pt.  . Type II diabetes mellitus ([place])    "diet controlled" (11/08/2015)    Patient Active Problem List   Diagnosis Date Noted  . Melena 02/21/2018  . Upper GI bleed 02/13/2018  . Coagulopathy ([place]) 02/13/2018  . Acute renal failure superimposed on stage 3 chronic kidney disease ([place]) 02/13/2018  . Weight gain 11/30/2015  . Coronary artery disease involving coronary bypass graft of native heart without angina pectoris   . Tachycardia   . Leukocytosis   . Abnormality of gait   . Status post above knee amputation of left lower extremity ([place])   . Acute blood loss anemia   . Acute pulmonary edema (HCC)   . Acute congestive heart failure ([place])   . Acute respiratory failure with hypoxia ([place])   . Pressure ulcer 11/09/2015  . Necrotic toes ([place]) 11/08/2015  . Lower extremity cellulitis 11/08/2015  . Bradycardia 05/28/2015  . PVD (peripheral vascular disease) with claudication ([place]) 09/30/2014  . Encounter for therapeutic drug monitoring 11/26/2013  . Peripheral vascular disease ([place]) 08/06/2012  . Chronic anticoagulation 01/20/2011  . Diabetes mellitus ([place]) 03/04/2010  . Dyslipidemia 03/04/2010  . Essential hypertension 03/04/2010  . Hx of CABG 03/04/2010  . Permanent atrial fibrillation ([place]) 04/08/2009    Past Surgical History:  Procedure Laterality Date  .  AMPUTATION Left 11/11/2015   Procedure: LEFT ABOVE KNEE AMPUTATION;  Surgeon: Angelia Mould, MD;  Location: [place];  Service: Vascular;  Laterality: Left;  . CARDIAC CATHETERIZATION  1990s; 04/16/2001  . CATARACT EXTRACTION W/ INTRAOCULAR LENS  IMPLANT, BILATERAL Bilateral 09/2015  . COLONOSCOPY N/A 11/27/2014   Procedure: COLONOSCOPY;  Surgeon: Rogene Houston, MD;  [facility];  Service: Endoscopy;  Laterality: N/A;  830  . CORONARY ARTERY BYPASS GRAFT  1990's   "CABG X4"  . ESOPHAGOGASTRODUODENOSCOPY N/A 02/14/2018   Procedure:  ESOPHAGOGASTRODUODENOSCOPY (EGD);  Surgeon: Rogene Houston, MD;  [facility];  Service: Endoscopy;  Laterality: N/A;  . ESOPHAGOGASTRODUODENOSCOPY (EGD) WITH PROPOFOL N/A 02/25/2018   Procedure: ESOPHAGOGASTRODUODENOSCOPY (EGD) WITH PROPOFOL;  Surgeon: Rogene Houston, MD;  [facility];  Service: Endoscopy;  Laterality: N/A;  . FRACTURE SURGERY    . GIVENS CAPSULE STUDY N/A 02/15/2018   Procedure: GIVENS CAPSULE STUDY;  Surgeon: Rogene Houston, MD;  [facility];  Service: Endoscopy;  Laterality: N/A;  . I&D EXTREMITY Left 11/10/2015   Procedure: INCISION AND DRAINAGE LEFT FOOT, AMPUTATION OF LEFT THIRD TOE;  Surgeon: Conrad [place], MD;  Location: [place];  Service: Vascular;  Laterality: Left;  . INCISION AND DRAINAGE ABSCESS Left 06/16/2005   wide exc. abscess 5th toe  . MANDIBULAR HARDWARE REMOVAL Left 08/02/2015   Procedure:  HARDWARE REMOVAL TWO MANDIBULAR SCREWS;  Surgeon: Jannette Fogo, DDS;  Location: [place];  Service: Oral Surgery;  Laterality: Left;  . ORIF TIBIA FRACTURE Left ~ 1970  . PERIPHERAL VASCULAR CATHETERIZATION N/A 08/14/2016   Procedure: Abdominal Aortogram w/Lower Extremity;  Surgeon: Angelia Mould, MD;  Location: [place] [facility];  Service: Cardiovascular;  Laterality: N/A;  . SQUAMOUS CELL CARCINOMA EXCISION Left 1993   "took my jaw out; got cadavar in there now"  . TONSILLECTOMY          Home Medications    Prior to Admission medications   Medication Sig Start Date End Date Taking? Authorizing Provider  amLODipine (NORVASC) 10 MG tablet Take 1 tablet (10 mg total) by mouth daily. 04/02/17   Herminio Commons, MD  aspirin EC 81 MG tablet Take 81 mg by mouth daily.    [provider]  Difluprednate (DUREZOL) 0.05 % EMUL Place 1 drop into both eyes 2 (two) times daily.     [provider]  feeding supplement (BOOST / RESOURCE BREEZE) LIQD Take 1 Container by mouth 3 (three) times daily  between meals. Patient taking differently: Take 1 Container by mouth 2 (two) times daily.  11/16/15   Loleta Chance, MD  ferrous sulfate 325 (65 FE) MG tablet Take 65 mg by mouth daily with breakfast.    [provider]  furosemide (LASIX) 20 MG tablet Take 1 tablet (20 mg total) by mouth daily as needed. 02/27/18   Rehman, Mechele Dawley, MD  lovastatin (MEVACOR) 10 MG tablet Take 10 mg by mouth daily.     [provider]  Multiple Vitamins-Minerals (CENTRUM SILVER PO) Take 1 tablet by mouth daily.      [provider]  nebivolol (BYSTOLIC) 10 MG tablet Take 10 mg by mouth daily.    [provider]  nystatin cream (MYCOSTATIN) Apply 1 application topically 3 (three) times daily.    [provider]  Omega-3 Fatty Acids (FISH OIL) 1200 MG CAPS Take 1,200 mg by mouth daily.    [provider]  pantoprazole (PROTONIX) 40 MG tablet Take 1 tablet (40 mg total) by mouth daily. 02/15/18   Orson Eva, MD  potassium chloride (K-DUR) 10 MEQ tablet Take 1 tablet (10 mEq total) by mouth daily. Take potassium on days when you take furosemide. 02/27/18   Rogene Houston, MD  Probiotic Product (PHILLIPS COLON HEALTH PO) Take 1 capsule by mouth daily.     [provider]  rivaroxaban (XARELTO) 20 MG TABS tablet Take 1 tablet (20 mg total) by mouth daily with supper. 02/26/18   Rehman, Mechele Dawley, MD  silver sulfADIAZINE (SILVADENE) 1 % cream Apply topically daily. Patient taking differently: Apply 1 application topically daily. Applied to affected area of foot. 11/16/15   Loleta Chance, MD  vitamin B-12 (CYANOCOBALAMIN) 1000 MCG tablet Take 2,000 mcg by mouth every morning.    [provider]    Family History Family History  Problem Relation Age of Onset  . Heart disease Mother     Social History Social History   Tobacco Use  . Smoking status: Never Smoker  . Smokeless tobacco: Never Used  Substance Use Topics  . Alcohol use: No    Alcohol/week:  0.0 oz  . Drug use: No     Allergies   Lipitor [atorvastatin]; Simvastatin; Penicillins; and Sulfa antibiotics   Review of Systems Review of Systems  Constitutional: Positive for fatigue. Negative for fever.  Respiratory: Positive for shortness of breath.   Cardiovascular: Negative for chest pain.  Gastrointestinal: Positive for blood in stool.  Neurological: Positive for weakness.  All other systems reviewed and are negative.    Physical Exam Updated Vital Signs BP 109/60   Pulse 64   Temp (!) 97.4 F (36.3 C)   Resp 12   Ht 1.727 m (5' 8" )   Wt 78.9 kg (174 lb)   SpO2 100%   BMI 26.46 kg/m   Physical Exam CONSTITUTIONAL: Poorly, frail, pale HEAD: Normocephalic/atraumatic EYES: EOMI/PERRL, conjunctiva pink ENMT: Mucous membranes moist NECK: supple no meningeal signs SPINE/BACK:entire spine nontender CV: Irregular no loud murmurs LUNGS: Lungs are clear to auscultation bilaterally, no apparent distress ABDOMEN: soft, nontender, no rebound or guarding, bowel sounds noted throughout abdomen Rectal-black stool noted, chaperone present  NEURO: Pt is awake/alert/appropriate,No facial droop.   EXTREMITIES: pulses normal/equal, left AKA noted SKIN: Patient is extremely pale PSYCH: no abnormalities of mood noted, alert and oriented to situation   ED Treatments / Results  Labs (all labs ordered are listed, but only abnormal results are displayed) Labs Reviewed  COMPREHENSIVE METABOLIC PANEL - Abnormal; Notable for the following components:      Result Value   CO2 13 (*)    Glucose, Bld 203 (*)    BUN 65 (*)    Creatinine, Ser 1.74 (*)    Calcium 7.6 (*)    Total Protein 5.6 (*)    Albumin 2.7 (*)    GFR calc non Af Amer 36 (*)    GFR calc Af Amer 41 (*)    All other components within normal limits  CBC - Abnormal; Notable for the following components:   WBC 14.5 (*)    RBC 1.44 (*)    Hemoglobin 4.2 (*)    HCT 13.8 (*)    RDW 19.6 (*)    All other  components within normal limits  PROTIME-INR - Abnormal; Notable for the following components:   Prothrombin Time 36.4 (*)    All other components within normal limits  POC OCCULT BLOOD, ED -  Abnormal; Notable for the following components:   Fecal Occult Bld POSITIVE (*)    All other components within normal limits  TYPE AND SCREEN  PREPARE RBC (CROSSMATCH)    EKG ED ECG REPORT   Date: 03/26/2018 0247  Rate: 78  Rhythm: atrial fibrillation  QRS Axis: normal  Intervals: QT prolonged  ST/T Wave abnormalities: nonspecific ST changes  Conduction Disutrbances:none  Narrative Interpretation:    I have personally reviewed the EKG tracing and agree with the computerized printout as noted.  Radiology No results found.  Procedures Procedures  CRITICAL CARE Performed by: Sharyon Cable Total critical care time: 45 minutes Critical care time was exclusive of separately billable procedures and treating other patients. Critical care was necessary to treat or prevent imminent or life-threatening deterioration. Critical care was time spent personally by me on the following activities: development of treatment plan with patient and/or surrogate as well as nursing, discussions with consultants, evaluation of patient's response to treatment, examination of patient, obtaining history from patient or surrogate, ordering and performing treatments and interventions, ordering and review of laboratory studies, ordering and review of radiographic studies, pulse oximetry and re-evaluation of patient's condition. Patient with acute GI bleed with a hemoglobin of 4 requiring blood transfusion, as well as INR reversal Medications Ordered in ED Medications  prothrombin complex conc human (KCENTRA) IVPB 1,958 Units (has no administration in time range)  phytonadione (VITAMIN K) 10 mg in dextrose 5 % 50 mL IVPB (10 mg Intravenous New Bag/Given 03/26/18 0443)  0.9 %  sodium chloride infusion (has no  administration in time range)     Initial Impression / Assessment and Plan / ED Course  I have reviewed the triage vital signs and the nursing notes.  Pertinent labs & imaging results that were available during my care of the patient were reviewed by me and considered in my medical decision making (see chart for details).     4:08 AM Patient here with fatigue and shortness of breath.  He is extremely pale, with a hemoglobin of 4.  He recently was transitioned to Xarelto as he had been supratherapeutic on Coumadin previously.  Today his INR is over 3 He had a recent EGD, capssle study and biopsy  that revealed evidence of probable celiac disease. Blood transfusion has been ordered.  We will give Kcentra to reverse his Xarelto and elevated INR Elected to use Kcentra to avoid large volume the patient is he will require multiple blood transfusions 4:50 AM Currently patient is awake and alert.  Blood pressure is 114/50.  He agrees to receive blood products.  Eppie Gibson is being mixed at an outside hospital and will be transported to [facility] next 1 to 2 hours.  He will be given vitamin K in the meantime.  Blood transfusion has been ordered. I discussed the case with Dr. Darrick Meigs for admission. Will need GI consult, but that can be deferred to the morning as patient needs to have medical resuscitation prior to GI intervention He may need to have his anticoagulation reconsidered due to repeat episodes of GI bleed Final Clinical Impressions(s) / ED Diagnoses   Final diagnoses:  Coagulopathy ([place])  Acute blood loss anemia  AKI (acute kidney injury) [facility])    ED Discharge Orders    None       Ripley Fraise, MD 03/26/18 0451

## 2018-03-26 NOTE — ED Notes (Signed)
CRITICAL VALUE ALERT  Critical Value:  Hemoglobin 4.2   Date & Time Notied:  03/26/18 0346  Provider Notified: Christy Gentles EDP  Orders Received/Actions taken: No orders at this time

## 2018-03-26 NOTE — Progress Notes (Signed)
Patient briefly seen and examined in emergency department, database reviewed.  Patient admitted earlier today due to shortness of breath and found to have a hemoglobin of 4.2.  He was recently admitted at the end of April and at that time had an upper endoscopy that showed erosive gastritis without bleeding stigmata and bulbar duodenitis without bleeding.  He was discharged home on Xarelto due to his history of A. fib.  Will be receiving a total of 4 units of PRBCs today with hemoglobin to follow.  GI consultation has been requested, will need to determine risk benefits of continued anticoagulation prior to discharge.  Domingo Mend, MD [facility] Pager: [phone]

## 2018-03-26 NOTE — ED Notes (Signed)
"  [name]"  Is patient safe word

## 2018-03-26 NOTE — H&P (Signed)
TRH H&P    Patient Demographics:    [name], is a 82 y.o. male  MRN: [id number]  DOB - Jun 12, 1936  Admit Date - 03/26/2018  Referring MD/NP/PA: Dr. Christy Gentles  Outpatient Primary MD for the patient is Lemmie Evens, MD  Patient coming from: Home  Chief complaint-shortness of breath   HPI:    [name]  is a 82 y.o. male, with history of atrial fibrillation, diet-controlled diabetes mellitus, hypertension, peripheral vascular disease, CAD, hyperlipidemia came to hospital with complaints of shortness of breath.  Patient was recently admitted for melena at that time upper GI endoscopy showed focal erosive gastritis without bleeding stigmata, bulbar duodenitis without bleeding.  Patient at that time received 2 units PRBC and was discharged home on Xarelto. Patient states that over the past 1 week he has noticed black-colored stool , and became progressive short of breath. He denies chest pain No nausea vomiting or diarrhea. No fever or dysuria. No abdominal pain. In the ED, lab work showed hemoglobin of 4.2.  10 mg of vitamin K IV and Kcentra have been ordered.  2 units of PRBC ordered by the ED physician    Review of systems:      All other systems reviewed and are negative.   With Past History of the following :    Past Medical History:  Diagnosis Date  . Arteriosclerotic cardiovascular disease (ASCVD)    CABG in 1990s; 2002 total obstruction of LAD, CX and RCA with patent grafts and nl EF; Stress nuc. 2008 - mild LV dilation; normal EF; questionable small anteroapical scar; no ischemia  . Arthritis    "left knee" (11/08/2015)  . Atrial fibrillation ([place])   . Cellulitis 11/08/2015   "both feet"  . Chronic anticoagulation   . Dental crowns present   . Diabetic peripheral neuropathy (HCC)    bilateral lower leg, hands  . History of blood transfusion    "related to my leg surgery?"  .  Hypertension    states under control with meds., has been on med. x "long time"  . Iron deficiency anemia   . Jaw cancer ([place]) 1990s   "squamous cell"  . Myocardial infarction ([place]) 1990s   "after my jaw OR"  . Pedal edema    "not a lot", per pt.  . Peripheral vascular disease ([place])   . Presence of retained hardware 07/2015   failed hardware jaw  . Runny nose 07/27/2015   clear drainage, per pt.  . Type II diabetes mellitus ([place])    "diet controlled" (11/08/2015)      Past Surgical History:  Procedure Laterality Date  . AMPUTATION Left 11/11/2015   Procedure: LEFT ABOVE KNEE AMPUTATION;  Surgeon: Angelia Mould, MD;  Location: [facility];  Service: Vascular;  Laterality: Left;  . CARDIAC CATHETERIZATION  1990s; 04/16/2001  . CATARACT EXTRACTION W/ INTRAOCULAR LENS  IMPLANT, BILATERAL Bilateral 09/2015  . COLONOSCOPY N/A 11/27/2014   Procedure: COLONOSCOPY;  Surgeon: Rogene Houston, MD;  [facility];  Service: Endoscopy;  Laterality: N/A;  830  . CORONARY ARTERY BYPASS GRAFT  1990's   "CABG X4"  . ESOPHAGOGASTRODUODENOSCOPY N/A 02/14/2018   Procedure: ESOPHAGOGASTRODUODENOSCOPY (EGD);  Surgeon: Rogene Houston, MD;  [facility];  Service: Endoscopy;  Laterality: N/A;  . ESOPHAGOGASTRODUODENOSCOPY (EGD) WITH PROPOFOL N/A 02/25/2018   Procedure: ESOPHAGOGASTRODUODENOSCOPY (EGD) WITH PROPOFOL;  Surgeon: Rogene Houston, MD;  [facility];  Service: Endoscopy;  Laterality: N/A;  . FRACTURE SURGERY    . GIVENS CAPSULE STUDY N/A 02/15/2018   Procedure: GIVENS CAPSULE STUDY;  Surgeon: Rogene Houston, MD;  [facility];  Service: Endoscopy;  Laterality: N/A;  . I&D EXTREMITY Left 11/10/2015   Procedure: INCISION AND DRAINAGE LEFT FOOT, AMPUTATION OF LEFT THIRD TOE;  Surgeon: Conrad Oswego, MD;  Location: [facility];  Service: Vascular;  Laterality: Left;  . INCISION AND DRAINAGE ABSCESS Left 06/16/2005   wide exc. abscess 5th toe  . MANDIBULAR HARDWARE  REMOVAL Left 08/02/2015   Procedure:  HARDWARE REMOVAL TWO MANDIBULAR SCREWS;  Surgeon: Jannette Fogo, DDS;  Location: [facility];  Service: Oral Surgery;  Laterality: Left;  . ORIF TIBIA FRACTURE Left ~ 1970  . PERIPHERAL VASCULAR CATHETERIZATION N/A 08/14/2016   Procedure: Abdominal Aortogram w/Lower Extremity;  Surgeon: Angelia Mould, MD;  Location: [facility];  Service: Cardiovascular;  Laterality: N/A;  . SQUAMOUS CELL CARCINOMA EXCISION Left 1993   "took my jaw out; got cadavar in there now"  . TONSILLECTOMY        Social History:      Social History   Tobacco Use  . Smoking status: Never Smoker  . Smokeless tobacco: Never Used  Substance Use Topics  . Alcohol use: No    Alcohol/week: 0.0 oz       Family History :     Family History  Problem Relation Age of Onset  . Heart disease Mother       Home Medications:   Prior to Admission medications   Medication Sig Start Date End Date Taking? Authorizing Provider  amLODipine (NORVASC) 10 MG tablet Take 1 tablet (10 mg total) by mouth daily. 04/02/17   Herminio Commons, MD  aspirin EC 81 MG tablet Take 81 mg by mouth daily.    [provider]  Difluprednate (DUREZOL) 0.05 % EMUL Place 1 drop into both eyes 2 (two) times daily.     [provider]  feeding supplement (BOOST / RESOURCE BREEZE) LIQD Take 1 Container by mouth 3 (three) times daily between meals. Patient taking differently: Take 1 Container by mouth 2 (two) times daily.  11/16/15   Loleta Chance, MD  ferrous sulfate 325 (65 FE) MG tablet Take 65 mg by mouth daily with breakfast.    [provider]  furosemide (LASIX) 20 MG tablet Take 1 tablet (20 mg total) by mouth daily as needed. 02/27/18   Rehman, Mechele Dawley, MD  lovastatin (MEVACOR) 10 MG tablet Take 10 mg by mouth daily.     [provider]  Multiple Vitamins-Minerals (CENTRUM SILVER PO) Take 1 tablet by mouth daily.      [provider]  nebivolol (BYSTOLIC) 10 MG tablet Take 10 mg by mouth daily.    [provider]  nystatin cream (MYCOSTATIN) Apply 1 application topically 3 (three) times daily.    [provider]  Omega-3 Fatty Acids (FISH OIL) 1200 MG CAPS Take 1,200 mg by mouth daily.    [provider]  pantoprazole ([place]perial)  40 MG tablet Take 1 tablet (40 mg total) by mouth daily. 02/15/18   Orson Eva, MD  potassium chloride (K-DUR) 10 MEQ tablet Take 1 tablet (10 mEq total) by mouth daily. Take potassium on days when you take furosemide. 02/27/18   Rogene Houston, MD  Probiotic Product (PHILLIPS COLON HEALTH PO) Take 1 capsule by mouth daily.     [provider]  rivaroxaban (XARELTO) 20 MG TABS tablet Take 1 tablet (20 mg total) by mouth daily with supper. 02/26/18   Rehman, Mechele Dawley, MD  silver sulfADIAZINE (SILVADENE) 1 % cream Apply topically daily. Patient taking differently: Apply 1 application topically daily. Applied to affected area of foot. 11/16/15   Loleta Chance, MD  vitamin B-12 (CYANOCOBALAMIN) 1000 MCG tablet Take 2,000 mcg by mouth every morning.    [provider]     Allergies:     Allergies  Allergen Reactions  . Lipitor [Atorvastatin] Other (See Comments)    SEVERE HEADACHE  . Simvastatin Other (See Comments)    MUSCLE ACHES  . Penicillins Rash    Has patient had a PCN reaction causing immediate rash, facial/tongue/throat swelling, SOB or lightheadedness with hypotension: Yes Has patient had a PCN reaction causing severe rash involving mucus membranes or skin necrosis: No Has patient had a PCN reaction that required hospitalization No Has patient had a PCN reaction occurring within the last 10 years: No If all of the above answers are "NO", then may proceed with Cephalosporin use.   . Sulfa Antibiotics Rash    Tolerates silver sulfadiazine cream at home     Physical Exam:   Vitals  Blood pressure (!) 114/50, pulse 72,  temperature (!) 97.4 F (36.3 C), temperature source Oral, resp. rate 17, height 5' 8"  (1.727 m), weight 78.9 kg (174 lb), SpO2 100 %.  1.  General: Pale looking male in no apparent distress  2. Psychiatric:  Intact judgement and  insight, awake alert, oriented x 3.  3. Neurologic: No focal neurological deficits, all cranial nerves intact.Strength 5/5 all 4 extremities, sensation intact all 4 extremities, plantars down going.  4. Eyes :  anicteric sclerae, moist conjunctivae with no lid lag. PERRLA.  5. ENMT:  Oropharynx clear with moist mucous membranes and good dentition  6. Neck:  supple, no cervical lymphadenopathy appriciated, No thyromegaly  7. Respiratory : Normal respiratory effort, good air movement bilaterally,clear to  auscultation bilaterally  8. Cardiovascular : RRR, no gallops, rubs or murmurs, no leg edema  9. Gastrointestinal:  Positive bowel sounds, abdomen soft, non-tender to palpation,no hepatosplenomegaly, no rigidity or guarding       10. Skin:  No cyanosis, normal texture and turgor, no rash, lesions or ulcers  11.Musculoskeletal:  Good muscle tone,  joints appear normal , no effusions,  normal range of motion    Data Review:    CBC Recent Labs  Lab 03/26/18 0250  WBC 14.5*  HGB 4.2*  HCT 13.8*  PLT 342  MCV 95.8  MCH 29.2  MCHC 30.4  RDW 19.6*   ------------------------------------------------------------------------------------------------------------------  Chemistries  Recent Labs  Lab 03/26/18 0250  NA 136  K 4.7  CL 110  CO2 13*  GLUCOSE 203*  BUN 65*  CREATININE 1.74*  CALCIUM 7.6*  AST 22  ALT 18  ALKPHOS 100  BILITOT 0.5   ------------------------------------------------------------------------------------------------------------------  ------------------------------------------------------------------------------------------------------------------ GFR: Estimated Creatinine Clearance: 33.3 mL/min (A) (by C-G  formula based on SCr of 1.74 mg/dL (H)). Liver Function Tests: Recent Labs  Lab  03/26/18 0250  AST 22  ALT 18  ALKPHOS 100  BILITOT 0.5  PROT 5.6*  ALBUMIN 2.7*   No results for input(s): LIPASE, AMYLASE in the last 168 hours. No results for input(s): AMMONIA in the last 168 hours. Coagulation Profile: Recent Labs  Lab 03/26/18 0250  INR 3.70   Cardiac Enzymes: No results for input(s): CKTOTAL, CKMB, CKMBINDEX, TROPONINI in the last 168 hours. BNP (last 3 results) No results for input(s): PROBNP in the last 8760 hours. HbA1C: No results for input(s): HGBA1C in the last 72 hours. CBG: No results for input(s): GLUCAP in the last 168 hours. Lipid Profile: No results for input(s): CHOL, HDL, LDLCALC, TRIG, CHOLHDL, LDLDIRECT in the last 72 hours. Thyroid Function Tests: No results for input(s): TSH, T4TOTAL, FREET4, T3FREE, THYROIDAB in the last 72 hours. Anemia Panel: No results for input(s): VITAMINB12, FOLATE, FERRITIN, TIBC, IRON, RETICCTPCT in the last 72 hours.  --------------------------------------------------------------------------------------------------------------- Urine analysis:    Imaging Results:    No results found.  My personal review of EKG: Rhythm atrial fibrillation-    Assessment & Plan:    Active Problems:   Essential hypertension   Permanent atrial fibrillation (HCC)   Coronary artery disease involving coronary bypass graft of native heart without angina pectoris   Upper GI bleed   Melena   Anemia   1. Anemia-likely from GI bleed, from - .  2 units of PRBC have been ordered.  Obtain H&H every 6 hours.  Follow CBC and BMP in a.m.  Patient  might need more PRBC based on the response to above. 2. Melena-patient complains of black stool, FOBT is positive in the ED. patient had EGD in April 2019 which showed bulbar duodenitis, erosive gastritis.  Will consult GI for further evaluation and recommendation.  Hold aspirin. 3. Acute kidney  injury-today patient's creatinine is 1.74, baseline is around 1.1.  Will recheck patient's renal function after blood transfusion. 4. Hypertension-blood pressure is stable, will hold furosemide, Bystolic, amlodipine 5. Coagulopathy-secondary to Xarelto, INR elevated at 3.70, patient given 1 dose of vitamin K 10 mg IV, Kcentra.  Follow PT/INR. 6. Chronic atrial fibrillation-we will hold anticoagulation at this time due to above.  Monitor patient closely in stepdown unit. 7. CAD-stable, hold aspirin.   DVT Prophylaxis-   SCDs  AM Labs Ordered, also please review Full Orders  Family Communication: Admission, patients condition and plan of care including tests being ordered have been discussed with the patient and his wife and daughter at bedside who indicate understanding and agree with the plan and Code Status.  Code Status: Full code  Admission status: Inpatient  Time spent in minutes : 60 minutes  Oswald Hillock M.D on 03/26/2018 at 5:01 AM  Between 7am to 7pm - Pager - [phone]. After 7pm go to [email or web address] - password [facility]  [facility] - Office  [phone]

## 2018-03-26 NOTE — Progress Notes (Signed)
GI bleeding scan reveals intestinal localization of tagged red cells in left midabdomen possibly jejunum. These findings were reviewed with patient and his wife earlier today. Will try to obtain more information with small bowel given capsule study as to location and type of lesion.

## 2018-03-26 NOTE — Progress Notes (Signed)
Spoke with Mitzy, PA. States Dr. Laural Golden is aware of patient consult and that Dr. Coralyn Mark has been in to see pt today.

## 2018-03-26 NOTE — ED Triage Notes (Signed)
Pt c/o gi bleed since April and increased sob since yesterday. Pt is very pale in color.

## 2018-03-27 ENCOUNTER — Encounter (HOSPITAL_COMMUNITY)
Admission: EM | Disposition: A | Discharge status: Home / Self Care | Payer: Self-pay | Source: Home / Self Care | Attending: Family Medicine

## 2018-03-27 ENCOUNTER — Encounter (HOSPITAL_COMMUNITY): Payer: Self-pay | Admitting: Internal Medicine

## 2018-03-27 HISTORY — PX: GIVENS CAPSULE STUDY: SHX5432

## 2018-03-27 LAB — COMPREHENSIVE METABOLIC PANEL
ALBUMIN: 2.4 g/dL — AB (ref 3.5–5.0)
ALK PHOS: 81 U/L (ref 38–126)
ALT: 16 U/L — ABNORMAL LOW (ref 17–63)
ANION GAP: 6 (ref 5–15)
AST: 24 U/L (ref 15–41)
BUN: 53 mg/dL — ABNORMAL HIGH (ref 6–20)
CO2: 18 mmol/L — AB (ref 22–32)
Calcium: 7.5 mg/dL — ABNORMAL LOW (ref 8.9–10.3)
Chloride: 117 mmol/L — ABNORMAL HIGH (ref 101–111)
Creatinine, Ser: 1.31 mg/dL — ABNORMAL HIGH (ref 0.61–1.24)
GFR calc Af Amer: 58 mL/min — ABNORMAL LOW (ref >60)
GFR calc non Af Amer: 50 mL/min — ABNORMAL LOW (ref >60)
Glucose, Bld: 118 mg/dL — ABNORMAL HIGH (ref 65–99)
POTASSIUM: 4.8 mmol/L (ref 3.5–5.1)
SODIUM: 141 mmol/L (ref 135–145)
Total Bilirubin: 1.5 mg/dL — ABNORMAL HIGH (ref 0.3–1.2)
Total Protein: 4.8 g/dL — ABNORMAL LOW (ref 6.5–8.1)

## 2018-03-27 LAB — PROTIME-INR
INR: 1.75
Prothrombin Time: 20.3 seconds — ABNORMAL HIGH (ref 11.4–15.2)

## 2018-03-27 LAB — HEMOGLOBIN AND HEMATOCRIT, BLOOD
HCT: 21.1 % — ABNORMAL LOW (ref 39.0–52.0)
HCT: 21.6 % — ABNORMAL LOW (ref 39.0–52.0)
HEMATOCRIT: 27.7 % — AB (ref 39.0–52.0)
HEMOGLOBIN: 6.7 g/dL — AB (ref 13.0–17.0)
Hemoglobin: 6.9 g/dL — CL (ref 13.0–17.0)
Hemoglobin: 9 g/dL — ABNORMAL LOW (ref 13.0–17.0)

## 2018-03-27 LAB — PREPARE RBC (CROSSMATCH)

## 2018-03-27 LAB — GLUCOSE, CAPILLARY: Glucose-Capillary: 142 mg/dL — ABNORMAL HIGH (ref 65–99)

## 2018-03-27 SURGERY — IMAGING PROCEDURE, GI TRACT, INTRALUMINAL, VIA CAPSULE

## 2018-03-27 MED ORDER — SODIUM CHLORIDE 0.9 % IV SOLN: Freq: Once | INTRAVENOUS | Status: DC

## 2018-03-27 MED ORDER — SODIUM CHLORIDE 0.9 % IV SOLN: INTRAVENOUS | Status: DC

## 2018-03-27 NOTE — Progress Notes (Signed)
Slept okay last night. Passing brown watery stool. H and H 3 33 am 6.7. Will be transfused with 2 more units of blood. Underwent a bleeding scan yesterday which revealed active bleeding in the left mid abdomen in a small bowel loop likely jejunal.  Blood pressure (!) 116/51, pulse 80, temperature (!) 97.4 F (36.3 C), temperature source Oral, resp. rate (!) 22, height 5' 8"  (1.727 m), weight 176 lb 5.9 oz (80 kg), SpO2 100 %. GI bleed. Given capsule pending.

## 2018-03-27 NOTE — Progress Notes (Signed)
03/27/2018 6:21 PM  I spoke with Dr. Laural Golden, he is telling me that he spoke with Dr. Geroge Baseman and he is requesting that patient transfer to [facility] in case he will need emergent intervention, otherwise they are planning to do angiography procedure in AM for his active GI bleed.  Will place orders to transfer to [facility].  Will update patient placement.   Murvin Natal MD

## 2018-03-27 NOTE — Progress Notes (Addendum)
PROGRESS NOTE    [name]  OEH:[id number]  DOB: 1936-06-13  DOA: 03/26/2018 PCP: Lemmie Evens, MD   Brief Admission Hx: [name]  is a 82 y.o. male, with history of atrial fibrillation, diet-controlled diabetes mellitus, hypertension, peripheral vascular disease, CAD, hyperlipidemia came to hospital with complaints of shortness of breath.  Patient was recently admitted for melena at that time upper GI endoscopy showed focal erosive gastritis without bleeding stigmata, bulbar duodenitis without bleeding.  Patient at that time received 2 units PRBC and was discharged home on Xarelto. Patient states that over the past 1 week he has noticed black-colored stool , and became progressive short of breath.  MDM/Assessment & Plan:   1. Acute blood loss Anemia-likely from GI bleed, from - Capsule study does demonstrate active GI bleeding.  4 units of PRBC have been ordered.  Obtain H&H every 6 hours.  Follow CBC and BMP in a.m.  2. Melena-patient complains of black stool, FOBT is positive in the ED. patient had EGD in April 2019 which showed bulbar duodenitis, erosive gastritis.  GI consulted and following.  Holding aspirin. 3. Acute kidney injury-today patient's creatinine is 1.74, baseline is around 1.1.  Will recheck patient's renal function after blood transfusion. 4. Hypertension-blood pressure is stable, will hold furosemide, Bystolic, amlodipine 5. Coagulopathy-secondary to Xarelto, INR elevated at 3.70, patient given 1 dose of vitamin K 10 mg IV, Kcentra.  Follow PT/INR. 6. Chronic atrial fibrillation-we will hold anticoagulation at this time due to above.  Monitor patient closely in stepdown unit. 7. CAD-stable, hold aspirin.   DVT Prophylaxis-   SCDs  AM Labs Ordered, also please review Full Orders  Family Communication: Admission, patients condition and plan of care including tests being ordered have been discussed with the patient and his wife and daughter at bedside  who indicate understanding and agree with the plan and Code Status.  Code Status: Full code  Subjective: Pt says that he feels much better after blood transfusion.  No chest pain and no SOB.   Objective: Vitals:   03/27/18 1200 03/27/18 1249 03/27/18 1304 03/27/18 1417  BP: 139/64 (!) 124/96 127/68 134/70  Pulse: 84 76    Resp: 11 18    Temp: 97.9 F (36.6 C) 98.1 F (36.7 C) 98.5 F (36.9 C) 97.9 F (36.6 C)  TempSrc: Oral Oral Oral Oral  SpO2:  100%    Weight:      Height:        Intake/Output Summary (Last 24 hours) at 03/27/2018 1527 Last data filed at 03/27/2018 1249 Gross per 24 hour  Intake 737 ml  Output -  Net 737 ml   Filed Weights   03/26/18 0245 03/26/18 0906  Weight: 78.9 kg (174 lb) 80 kg (176 lb 5.9 oz)     REVIEW OF SYSTEMS  As per history otherwise all reviewed and reported negative  Exam:  General exam: awake, alert, NAD, cooperative.   Respiratory system: Clear. No increased work of breathing. Cardiovascular system: S1 & S2 heard, RRR. No JVD, murmurs, gallops, clicks or pedal edema. Gastrointestinal system: Abdomen is nondistended, soft and nontender. Normal bowel sounds heard. Central nervous system: Alert and oriented. No focal neurological deficits. Extremities: no CCE.  Data Reviewed: Basic Metabolic Panel: Recent Labs  Lab 03/26/18 0250 03/26/18 1110 03/27/18 0333  NA 136 141 141  K 4.7 5.3* 4.8  CL 110 115* 117*  CO2 13* 16* 18*  GLUCOSE 203* 134* 118*  BUN 65* 67* 53*  CREATININE 1.74* 1.55* 1.31*  CALCIUM 7.6* 7.9* 7.5*   Liver Function Tests: Recent Labs  Lab 03/26/18 0250 03/26/18 1110 03/27/18 0333  AST 22 23 24   ALT 18 18 16*  ALKPHOS 100 100 81  BILITOT 0.5 1.9* 1.5*  PROT 5.6* 5.8* 4.8*  ALBUMIN 2.7* 2.8* 2.4*   No results for input(s): LIPASE, AMYLASE in the last 168 hours. No results for input(s): AMMONIA in the last 168 hours. CBC: Recent Labs  Lab 03/26/18 0250 03/26/18 1110 03/26/18 1626  03/26/18 2139 03/27/18 0333 03/27/18 0924  WBC 14.5* 14.9*  --   --   --   --   HGB 4.2* 6.4* 7.4* 7.4* 6.7* 6.9*  HCT 13.8* 20.3* 23.0* 22.9* 21.1* 21.6*  MCV 95.8 91.0  --   --   --   --   PLT 342 316  --   --   --   --    Cardiac Enzymes: No results for input(s): CKTOTAL, CKMB, CKMBINDEX, TROPONINI in the last 168 hours. CBG (last 3)  No results for input(s): GLUCAP in the last 72 hours. Recent Results (from the past 240 hour(s))  MRSA PCR Screening     Status: None   Collection Time: 03/26/18  8:55 AM  Result Value Ref Range Status   MRSA by PCR NEGATIVE NEGATIVE Final    Comment:        The GeneXpert MRSA Assay (FDA approved for NASAL specimens only), is one component of a comprehensive MRSA colonization surveillance program. It is not intended to diagnose MRSA infection nor to guide or monitor treatment for MRSA infections. Performed at [facility], [address] [place], [place] [address]      Studies: Nm Gi Blood Loss  Result Date: 03/26/2018 CLINICAL DATA:  GI bleed EXAM: NUCLEAR MEDICINE GASTROINTESTINAL BLEEDING SCAN TECHNIQUE: Sequential abdominal images were obtained following intravenous administration of Tc-89mlabeled red blood cells. RADIOPHARMACEUTICALS:  25.6 mCi Tc-983mertechnetate in-vitro labeled red cells. COMPARISON:  CT abdomen and pelvis 10/29/2016 FINDINGS: Beginning on the first image, a focus of abnormal intestinal localization of labeled red cells is identified in the LEFT mid abdomen. Over the course of the first hour imaging this transits curvilinear bowel loops in the LEFT mid abdomen. Findings are consistent with a small bowel origin of active gastrointestinal bleeding. IMPRESSION: Positive GI bleeding scan for presence of an active source of GI bleeding in the LEFT mid abdomen in a small bowel loop likely jejunal. Findings called to Dr. ReLaural Goldenn 03/26/2018 at 1613 hours. Electronically Signed   By: MaLavonia Dana.D.   On: 03/26/2018 16:15      Scheduled Meds: . Difluprednate  1 drop Both Eyes BID  . [START ON 03/29/2018] pantoprazole  40 mg Intravenous Q12H   Continuous Infusions: . sodium chloride 10 mL/hr at 03/26/18 0953  . sodium chloride Stopped (03/27/18 1048)  . pantoprozole (PROTONIX) infusion 8 mg/hr (03/27/18 0945)    Active Problems:   Essential hypertension   Permanent atrial fibrillation (HCC)   Coronary artery disease involving coronary bypass graft of native heart without angina pectoris   Upper GI bleed   Melena   Anemia   Critical Care Time spent: 4367ins  ClIrwin BrakemanMD, FAAFP [facility] Pager [phone] 7PM-7AM, please contact night-coverage [email or web address] Password [email or web address] 03/27/2018, 3:27 PM    LOS: 1 day

## 2018-03-27 NOTE — Progress Notes (Addendum)
Patient ID: AVANT PRINTY, male   DOB: [birth year], 82 y.o.   MRN: [id number]   Request made for mesenteric arteriogram with possible embolization Hemodynamically stable per Dr Laural Golden  INR 1.7 today  Plan for procedure in IR [facility] tomorrow am  We will place orders for pt to come to [facility] in am via ambulance Or If pt is transferred to [facility] and admitted to [facility] service or Int Med-- we will see him here.  Orders in for npo Stat INR in am CBC...  Will speak to RN after she has gotten orders from Dr Laural Golden   ADDENDUM:  Pt is to be at [facility] 830 am via ambulance Will return to [facility] after procedure NPO orders in place  Will recheck INR in am

## 2018-03-27 NOTE — Op Note (Signed)
Small Bowel Givens Capsule Study Procedure date: March 27, 2018  Referring Provider: Thersa Salt, MD PCP:  Dr. Lemmie Evens, MD  Indication for procedure:   Patient is 82 year old Caucasian male with recurrent GI bleed determined to be from small bowel source based on positive GI bleeding scan.  He is undergoing small bowel given capsule study to determine the etiology and location of bleeding site to help plan with therapeutic intervention. Prior study of 02/15/2018 revealed no bleeding lesions but showed junctional mucosal abnormality consistent with celiac disease which was subsequently confirmed with biopsy on push enteroscopy which again did not reveal bleeding lesion.  Scope was advanced to 130 cm.   Findings:   Patient able to swallow given capsule without any difficulty. He is complete as given capsule reached cecum within the study. No abnormality noted to esophageal mucosa. No blood or lesions noted involving gastric mucosa but the was limited. Small amount of fresh blood initially noted on image at [phone] and just distal to there was large amount of fresh blood with formation of clot.  Best seen on images [phone] and [id number] noted in rest of the small bowel all the way to proximal colon where blood is burgundy in color. There is scalloping to jejunal mucosa secondary to celiac disease well documented on recent biopsy.    First Gastric image: 36 min and 4 sec First Duodenal image: 41 min and 8 sec First Ileo-Cecal Valve image: 5 hrs 19 min and 11 sec First Cecal image: 5 hrs 24 min and 4 sec Gastric Passage time: 4 min and 56 sec Small Bowel Passage time: 4 hrs and 38 min  Summary & Recommendations: Active bleeding noted from proximal jejunal mucosa but no lesion identified.  Suspect Dieulafoy lesion or an ulcer in the setting of celiac disease.  Blood noted in rest of the small bowel originating from proximal jejunum.  Blood also noted in proximal  colon. Jejunal mucosal scalloping secondary to recently diagnosed celiac disease  Findings reviewed with Dr. Jacqulynn Cadet at [facility]. He recommends patient be transferred to [facility].  He will possibly undergo abdominal angiography in a.m.

## 2018-03-27 NOTE — Progress Notes (Signed)
Patient has no complaints. He has not had a bowel movement in over 8 hours. Patient is comfortable sitting in recliner. Vital signs are stable. He appears less pale than yesterday. H&H from this morning was 6.9 and 21.6. She has received 4 units of PRBCs. Given capsule study ongoing.  Bedside review reveals blood in small bowel. Therefore patient is actively bleeding. Have contacted Dr. Laurence Ferrari of interventional radiology; talk with his PA Ms. Pam Turpen as he is tied up in a procedure. I requested abdominal angiography ASAP. She did not receive another unit of PRBCs as soon as possible. Will check INR. It is agreeable to be transferred to [facility] abdominal angiography. His daughter-in-law Damontay Alred was also notified.

## 2018-03-28 ENCOUNTER — Encounter (HOSPITAL_COMMUNITY): Payer: Self-pay | Admitting: Diagnostic Radiology

## 2018-03-28 ENCOUNTER — Inpatient Hospital Stay (HOSPITAL_COMMUNITY): Payer: Medicare Other

## 2018-03-28 DIAGNOSIS — I25810 Atherosclerosis of coronary artery bypass graft(s) without angina pectoris: Secondary | ICD-10-CM

## 2018-03-28 DIAGNOSIS — N179 Acute kidney failure, unspecified: Secondary | ICD-10-CM

## 2018-03-28 DIAGNOSIS — K922 Gastrointestinal hemorrhage, unspecified: Secondary | ICD-10-CM

## 2018-03-28 DIAGNOSIS — I482 Chronic atrial fibrillation: Secondary | ICD-10-CM

## 2018-03-28 DIAGNOSIS — D689 Coagulation defect, unspecified: Secondary | ICD-10-CM

## 2018-03-28 DIAGNOSIS — I10 Essential (primary) hypertension: Secondary | ICD-10-CM

## 2018-03-28 DIAGNOSIS — D62 Acute posthemorrhagic anemia: Secondary | ICD-10-CM

## 2018-03-28 HISTORY — PX: IR US GUIDE VASC ACCESS LEFT: IMG2389

## 2018-03-28 HISTORY — PX: IR ANGIOGRAM VISCERAL SELECTIVE: IMG657

## 2018-03-28 LAB — TYPE AND SCREEN
ABO/RH(D): A POS
Antibody Screen: NEGATIVE
UNIT DIVISION: 0
UNIT DIVISION: 0
UNIT DIVISION: 0
Unit division: 0
Unit division: 0
Unit division: 0

## 2018-03-28 LAB — BPAM RBC
BLOOD PRODUCT EXPIRATION DATE: 201906112359
BLOOD PRODUCT EXPIRATION DATE: 201906172359
Blood Product Expiration Date: 201906042359
Blood Product Expiration Date: 201906112359
Blood Product Expiration Date: 201906142359
Blood Product Expiration Date: 201906162359
ISSUE DATE / TIME: 201906040443
ISSUE DATE / TIME: 201906040715
ISSUE DATE / TIME: 201906041140
ISSUE DATE / TIME: 201906051244
ISSUE DATE / TIME: 201906041654
ISSUE DATE / TIME: 201906051525
UNIT TYPE AND RH: 6200
UNIT TYPE AND RH: 6200
Unit Type and Rh: 5100
Unit Type and Rh: 5100
Unit Type and Rh: 6200
Unit Type and Rh: 6200

## 2018-03-28 LAB — HEMOGLOBIN AND HEMATOCRIT, BLOOD
HCT: 22.9 % — ABNORMAL LOW (ref 39.0–52.0)
HCT: 23.5 % — ABNORMAL LOW (ref 39.0–52.0)
HCT: 24.5 % — ABNORMAL LOW (ref 39.0–52.0)
HEMOGLOBIN: 7.4 g/dL — AB (ref 13.0–17.0)
HEMOGLOBIN: 7.5 g/dL — AB (ref 13.0–17.0)
Hemoglobin: 7.6 g/dL — ABNORMAL LOW (ref 13.0–17.0)

## 2018-03-28 LAB — PROTIME-INR
INR: 1.48
PROTHROMBIN TIME: 17.8 s — AB (ref 11.4–15.2)

## 2018-03-28 MED ORDER — FENTANYL CITRATE (PF) 100 MCG/2ML IJ SOLN
INTRAMUSCULAR | Status: AC
  Filled 2018-03-28: qty 4

## 2018-03-28 MED ORDER — MIDAZOLAM HCL 2 MG/2ML IJ SOLN
INTRAMUSCULAR | Status: AC
  Filled 2018-03-28: qty 4

## 2018-03-28 MED ORDER — IOPAMIDOL (ISOVUE-300) INJECTION 61%
INTRAVENOUS | Status: AC
  Administered 2018-03-28: 75 mL
  Filled 2018-03-28: qty 200

## 2018-03-28 MED ORDER — MIDAZOLAM HCL 2 MG/2ML IJ SOLN
INTRAMUSCULAR | Status: DC | PRN
  Administered 2018-03-28: 1 mg via INTRAVENOUS
  Administered 2018-03-28: 0.5 mg via INTRAVENOUS

## 2018-03-28 MED ORDER — FENTANYL CITRATE (PF) 100 MCG/2ML IJ SOLN
INTRAMUSCULAR | Status: DC | PRN
  Administered 2018-03-28: 50 ug via INTRAVENOUS
  Administered 2018-03-28 (×2): 25 ug via INTRAVENOUS

## 2018-03-28 MED ORDER — LIDOCAINE HCL 1 % IJ SOLN
INTRAMUSCULAR | Status: AC
  Filled 2018-03-28: qty 20

## 2018-03-28 MED ORDER — LIDOCAINE HCL 1 % IJ SOLN
INTRAMUSCULAR | Status: DC | PRN
  Administered 2018-03-28: 10 mL

## 2018-03-28 MED ORDER — MIDAZOLAM HCL 5 MG/5ML IJ SOLN
INTRAMUSCULAR | Status: DC | PRN
  Administered 2018-03-28: 0.5 mg via INTRAVENOUS

## 2018-03-28 NOTE — Sedation Documentation (Signed)
Manual pressure released at 11:27

## 2018-03-28 NOTE — Progress Notes (Signed)
Pt arrived to unit from [facility] accompanied by wife and son.  Alert and oriented X 4. Denies pain.  Oriented to room and call light.  Put on progressive care monitor and skin assessment completed.

## 2018-03-28 NOTE — Progress Notes (Signed)
Patient received from IR drowsy but stable. Oriented x4 Vitals done.  Left groin with gauze. CDI. Level 0

## 2018-03-28 NOTE — Procedures (Signed)
  Pre-operative Diagnosis: GI bleeding, suspect jejunal source       Post-operative Diagnosis: Normal mesenteric arteriography   Indications: GI bleeding   Procedure: Celiac and SMA arteriography.    Findings: No active bleeding and no vascular malformation identified.   Patent portal venous system.  Complications: None     EBL: Minimal  Contrast: 75 Isovue 300.  Plan: Bedrest 3 hours. Follow CBC.

## 2018-03-28 NOTE — Sedation Documentation (Signed)
[place] deployed by Dr. Anselm Pancoast

## 2018-03-28 NOTE — Consult Note (Signed)
Chief Complaint: Patient was seen in consultation today for mesenteric/viceral arteriogram with possible embolization Chief Complaint  Patient presents with  . GI Bleeding    Referring Physician(s): Rehman,N  Supervising Physician: Markus Daft  Patient Status: [facility] - In-pt   History of Present Illness: [name] is a 82 y.o. male with history of coronary artery disease/prior CABG/MI/ atrial fibrillation on xarelto, diabetes, hypertension, squamous cell carcinoma of the jaw, PVD with prior left AKA, hyperlipidemia who was recently admitted to [facility] with dyspnea and melena.  Capsule endoscopy revealed some active bleeding from the proximal jejunal mucosa but no lesion identified.  Findings were suspicious for a Dieulafoy lesion also in the setting of celiac disease.  Nuclear medicine bleeding scan performed on 6/4 revealed active source of GI bleeding in the left mid abdomen and a small bowel loop likely jejunal.  He has since been transferred to [facility] and request now received for mesenteric/visceral arteriogram with possible embolization.  Past Medical History:  Diagnosis Date  . Arteriosclerotic cardiovascular disease (ASCVD)    CABG in 1990s; 2002 total obstruction of LAD, CX and RCA with patent grafts and nl EF; Stress nuc. 2008 - mild LV dilation; normal EF; questionable small anteroapical scar; no ischemia  . Arthritis    "left knee" (11/08/2015)  . Atrial fibrillation ([place])   . Cellulitis 11/08/2015   "both feet"  . Chronic anticoagulation   . Dental crowns present   . Diabetic peripheral neuropathy (HCC)    bilateral lower leg, hands  . History of blood transfusion    "related to my leg surgery?"  . Hypertension    states under control with meds., has been on med. x "long time"  . Iron deficiency anemia   . Jaw cancer ([place]) 1990s   "squamous cell"  . Myocardial infarction ([place]) 1990s   "after my jaw OR"  . Pedal edema    "not a lot",  per pt.  . Peripheral vascular disease ([place])   . Presence of retained hardware 07/2015   failed hardware jaw  . Runny nose 07/27/2015   clear drainage, per pt.  . Type II diabetes mellitus ([place])    "diet controlled" (11/08/2015)    Past Surgical History:  Procedure Laterality Date  . AMPUTATION Left 11/11/2015   Procedure: LEFT ABOVE KNEE AMPUTATION;  Surgeon: Angelia Mould, MD;  Location: [place];  Service: Vascular;  Laterality: Left;  . CARDIAC CATHETERIZATION  1990s; 04/16/2001  . CATARACT EXTRACTION W/ INTRAOCULAR LENS  IMPLANT, BILATERAL Bilateral 09/2015  . COLONOSCOPY N/A 11/27/2014   Procedure: COLONOSCOPY;  Surgeon: Rogene Houston, MD;  [facility];  Service: Endoscopy;  Laterality: N/A;  830  . CORONARY ARTERY BYPASS GRAFT  1990's   "CABG X4"  . ESOPHAGOGASTRODUODENOSCOPY N/A 02/14/2018   Procedure: ESOPHAGOGASTRODUODENOSCOPY (EGD);  Surgeon: Rogene Houston, MD;  [facility];  Service: Endoscopy;  Laterality: N/A;  . ESOPHAGOGASTRODUODENOSCOPY (EGD) WITH PROPOFOL N/A 02/25/2018   Procedure: ESOPHAGOGASTRODUODENOSCOPY (EGD) WITH PROPOFOL;  Surgeon: Rogene Houston, MD;  [facility];  Service: Endoscopy;  Laterality: N/A;  . FRACTURE SURGERY    . GIVENS CAPSULE STUDY N/A 02/15/2018   Procedure: GIVENS CAPSULE STUDY;  Surgeon: Rogene Houston, MD;  [facility];  Service: Endoscopy;  Laterality: N/A;  . GIVENS CAPSULE STUDY N/A 03/27/2018   Procedure: GIVENS CAPSULE STUDY;  Surgeon: Rogene Houston, MD;  [facility];  Service: Endoscopy;  Laterality: N/A;  . I&D EXTREMITY Left 11/10/2015   Procedure: INCISION AND DRAINAGE LEFT FOOT, AMPUTATION OF LEFT THIRD TOE;  Surgeon: Conrad [place], MD;  Location: [place];  Service: Vascular;  Laterality: Left;  . INCISION AND DRAINAGE ABSCESS Left 06/16/2005   wide exc. abscess 5th toe  . MANDIBULAR HARDWARE REMOVAL Left 08/02/2015   Procedure:  HARDWARE REMOVAL TWO MANDIBULAR  SCREWS;  Surgeon: Jannette Fogo, DDS;  Location: [place];  Service: Oral Surgery;  Laterality: Left;  . ORIF TIBIA FRACTURE Left ~ 1970  . PERIPHERAL VASCULAR CATHETERIZATION N/A 08/14/2016   Procedure: Abdominal Aortogram w/Lower Extremity;  Surgeon: Angelia Mould, MD;  Location: [place] [facility];  Service: Cardiovascular;  Laterality: N/A;  . SQUAMOUS CELL CARCINOMA EXCISION Left 1993   "took my jaw out; got cadavar in there now"  . TONSILLECTOMY      Allergies: Lipitor [atorvastatin]; Simvastatin; Penicillins; and Sulfa antibiotics  Medications: Prior to Admission medications   Medication Sig Start Date End Date Taking? Authorizing Provider  amLODipine (NORVASC) 10 MG tablet Take 1 tablet (10 mg total) by mouth daily. 04/02/17  Yes Herminio Commons, MD  aspirin EC 81 MG tablet Take 81 mg by mouth daily.   Yes [provider]  cetirizine (ZYRTEC) 10 MG tablet Take 10 mg by mouth daily.   Yes [provider]  Difluprednate (DUREZOL) 0.05 % EMUL Place 1 drop into both eyes 2 (two) times daily.    Yes [provider]  feeding supplement (BOOST / RESOURCE BREEZE) LIQD Take 1 Container by mouth 3 (three) times daily between meals. Patient taking differently: Take 1 Container by mouth 2 (two) times daily.  11/16/15  Yes Loleta Chance, MD  ferrous sulfate 325 (65 FE) MG tablet Take 65 mg by mouth daily with breakfast.   Yes [provider]  losartan-hydrochlorothiazide (HYZAAR) 100-12.5 MG tablet Take 1 tablet by mouth daily.   Yes [provider]  lovastatin (MEVACOR) 10 MG tablet Take 10 mg by mouth daily.    Yes [provider]  Multiple Vitamins-Minerals (CENTRUM SILVER PO) Take 1 tablet by mouth daily.     Yes [provider]  nebivolol (BYSTOLIC) 10 MG tablet Take 10 mg by mouth daily.   Yes [provider]  nystatin cream (MYCOSTATIN) Apply 1 application topically 3 (three) times daily.    Yes [provider]  Omega-3 Fatty Acids (FISH OIL) 1200 MG CAPS Take 1,200 mg by mouth daily.   Yes [provider]  pantoprazole (PROTONIX) 40 MG tablet Take 1 tablet (40 mg total) by mouth daily. 02/15/18  Yes Tat, Shanon Brow, MD  Pediatric Multivitamins-Iron (FLINTSTONES PLUS IRON PO) Take 1 tablet by mouth daily.   Yes [provider]  Probiotic Product ([place]) Take 1 capsule by mouth daily.    Yes [provider]  rivaroxaban (XARELTO) 20 MG TABS tablet Take 1 tablet (20 mg total) by mouth daily with supper. 02/26/18  Yes Rehman, Mechele Dawley, MD  silver sulfADIAZINE (SILVADENE) 1 % cream Apply topically daily. Patient taking differently: Apply 1 application topically daily. Applied to affected area of foot. 11/16/15  Yes Loleta Chance, MD  vitamin B-12 (CYANOCOBALAMIN) 1000 MCG tablet Take 2,000 mcg by mouth every morning.   Yes [provider]     Family History  Problem Relation Age of Onset  . Heart disease Mother     Social History   Socioeconomic History  . Marital  status: Married    Spouse name: Not on file  . Number of children: Not on file  . Years of education: Not on file  . Highest education level: Not on file  Occupational History  . Not on file  Social Needs  . Financial resource strain: Not on file  . Food insecurity:    Worry: Not on file    Inability: Not on file  . Transportation needs:    Medical: Not on file    Non-medical: Not on file  Tobacco Use  . Smoking status: Never Smoker  . Smokeless tobacco: Never Used  Substance and Sexual Activity  . Alcohol use: No    Alcohol/week: 0.0 oz  . Drug use: No  . Sexual activity: Yes  Lifestyle  . Physical activity:    Days per week: Not on file    Minutes per session: Not on file  . Stress: Not on file  Relationships  . Social connections:    Talks on phone: Not on file    Gets together: Not on file    Attends religious service: Not on file     Active member of club or organization: Not on file    Attends meetings of clubs or organizations: Not on file    Relationship status: Not on file  Other Topics Concern  . Not on file  Social History Narrative  . Not on file     Review of Systems currently denies fever, headache, chest pain, abdominal/back pain, nausea, vomiting.  Vital Signs: BP 123/73 (BP Location: Left Arm)   Pulse 62   Temp 97.7 F (36.5 C) (Oral)   Resp 16   Ht 5' 8"  (1.727 m)   Wt 176 lb 5.9 oz (80 kg)   SpO2 100%   BMI 26.82 kg/m   Physical Exam awake, alert.  Chest clear to auscultation bilaterally.  Heart with normal rate, irregular rhythm.  Abdomen soft, positive bowel sounds, nontender.  Left AKA.  No right lower extremity edema.  Imaging: Nm Gi Blood Loss  Result Date: 03/26/2018 CLINICAL DATA:  GI bleed EXAM: NUCLEAR MEDICINE GASTROINTESTINAL BLEEDING SCAN TECHNIQUE: Sequential abdominal images were obtained following intravenous administration of Tc-39mlabeled red blood cells. RADIOPHARMACEUTICALS:  25.6 mCi Tc-987mertechnetate in-vitro labeled red cells. COMPARISON:  CT abdomen and pelvis 10/29/2016 FINDINGS: Beginning on the first image, a focus of abnormal intestinal localization of labeled red cells is identified in the LEFT mid abdomen. Over the course of the first hour imaging this transits curvilinear bowel loops in the LEFT mid abdomen. Findings are consistent with a small bowel origin of active gastrointestinal bleeding. IMPRESSION: Positive GI bleeding scan for presence of an active source of GI bleeding in the LEFT mid abdomen in a small bowel loop likely jejunal. Findings called to Dr. ReLaural Goldenn 03/26/2018 at 1613 hours. Electronically Signed   By: MaLavonia Dana.D.   On: 03/26/2018 16:15    Labs:  CBC: Recent Labs    02/22/18 0956 03/07/18 0840 03/26/18 0250 03/26/18 1110  03/27/18 0333 03/27/18 0924 03/27/18 1814 03/28/18 0505  WBC 6.8 5.4 14.5* 14.9*  --   --   --   --   --     HGB 8.6* 8.9* 4.2* 6.4*   < > 6.7* 6.9* 9.0* 7.4*  HCT 25.8* 27.0* 13.8* 20.3*   < > 21.1* 21.6* 27.7* 22.9*  PLT 330 382 342 316  --   --   --   --   --    < > =  values in this interval not displayed.    COAGS: Recent Labs    03/26/18 0250 03/26/18 0732 03/27/18 1302 03/28/18 0505  INR 3.70 2.64 1.75 1.48    BMP: Recent Labs    02/22/18 0956 03/07/18 0840 03/26/18 0250 03/26/18 1110 03/27/18 0333  NA 139 143 136 141 141  K 4.0 4.1 4.7 5.3* 4.8  CL 112* 110 110 115* 117*  CO2 17* 24 13* 16* 18*  GLUCOSE 101* 102* 203* 134* 118*  BUN 21* 16 65* 67* 53*  CALCIUM 7.5* 8.0* 7.6* 7.9* 7.5*  CREATININE 1.17 1.17 1.74* 1.55* 1.31*  GFRNONAA 57*  --  36* 41* 50*  GFRAA >60  --  41* 47* 58*    LIVER FUNCTION TESTS: Recent Labs    03/26/18 0250 03/26/18 1110 03/27/18 0333  BILITOT 0.5 1.9* 1.5*  AST 22 23 24   ALT 18 18 16*  ALKPHOS 100 100 81  PROT 5.6* 5.8* 4.8*  ALBUMIN 2.7* 2.8* 2.4*    TUMOR MARKERS: No results for input(s): AFPTM, CEA, CA199, CHROMGRNA in the last 8760 hours.  Assessment and Plan: 82 y.o. male with history of coronary artery disease/prior CABG/MI/ atrial fibrillation on xarelto, diabetes, hypertension, squamous cell carcinoma of the jaw, PVD with prior left AKA, hyperlipidemia who was recently admitted to [facility] with dyspnea and melena.  He has been transfused.  Capsule endoscopy revealed some active bleeding from the proximal jejunal mucosa but no lesion identified.  Findings were suspicious for a Dieulafoy lesion also in the setting of celiac disease.  Nuclear medicine bleeding scan performed on 6/4 revealed active source of GI bleeding in the left mid abdomen and a small bowel loop likely jejunal.  He has since been transferred to [facility] and request now received for mesenteric/visceral arteriogram with possible embolization.  Current labs include PT 17.8, INR 1.48, hemoglobin 7.4, plts 316k, creat 1.31.  Latest imaging  studies were reviewed by Dr. Anselm Pancoast. Risks and benefits of proxcedure were discussed with the patient/family including, but not limited to bleeding, infection, vascular injury or contrast induced renal failure, as well as an ability to embolize vessel  This interventional procedure involves the use of X-rays and because of the nature of the planned procedure, it is possible that we will have prolonged use of X-ray fluoroscopy.  Potential radiation risks to you include (but are not limited to) the following: - A slightly elevated risk for cancer  several years later in life. This risk is typically less than 0.5% percent. This risk is low in comparison to the normal incidence of human cancer, which is 33% for women and 50% for men according to the [place]. - Radiation induced injury can include skin redness, resembling a rash, tissue breakdown / ulcers and hair loss (which can be temporary or permanent).   The likelihood of either of these occurring depends on the difficulty of the procedure and whether you are sensitive to radiation due to previous procedures, disease, or genetic conditions.   IF your procedure requires a prolonged use of radiation, you will be notified and given written instructions for further action.  It is your responsibility to monitor the irradiated area for the 2 weeks following the procedure and to notify your physician if you are concerned that you have suffered a radiation induced injury.    All of the patient's questions were answered, patient is agreeable to proceed.  Consent signed and in chart.  Procedure planned for this morning.  Thank you for this interesting consult.  I greatly enjoyed meeting [name] and look forward to participating in their care.  A copy of this report was sent to the requesting provider on this date.  Electronically Signed: D. Rowe Robert, PA-C 03/28/2018, 8:30 AM   I spent a total of 30 minutes    in face to  face in clinical consultation, greater than 50% of which was counseling/coordinating care for mesenteric/visceral arteriogram with possible embolization

## 2018-03-28 NOTE — Progress Notes (Signed)
Patient ID: [name], male   DOB: 09/15/1936, 82 y.o.   MRN: [id number]  PROGRESS NOTE    LEODAN BOLYARD  HYQ:[id number] DOB: [birth year] DOA: 03/26/2018 PCP: Lemmie Evens, MD   Brief Narrative: PhilipYoungis a 82 y.o.male,with history of atrial fibrillation, diet-controlled diabetes mellitus, hypertension, peripheral vascular disease, CAD, hyperlipidemia came to hospital with complaints of shortness of breath. Patient was recently admitted for melena at that time upper GI endoscopy showed focal erosive gastritis without bleeding stigmata, bulbar duodenitis without bleeding. Patient at that time received 2 units PRBC and was discharged home on Xarelto. Patient states that over the past 1 week he has noticed black-colored stool , and became progressive short of breath.     Assessment & Plan:   Active Problems:   Essential hypertension   Permanent atrial fibrillation (HCC)   Coronary artery disease involving coronary bypass graft of native heart without angina pectoris   Upper GI bleed   Melena   Anemia  MDM/Assessment & Plan:   1. Acute blood loss Anemia-likely from GI bleed, from - Capsule study does demonstrate active GI bleeding. 4 units of PRBC have been ordered and transfused. Patient is supposed to go to angiography this morning with IR.  He reports clear liquid bowel movements since last night. 2. Melena-patient complains of black stool actually, FOBT is positive in the ED. patient had EGD in April 2019 which showed bulbar duodenitis, erosive gastritis.  Holding aspirin. 3. Acute kidney injury-peak creatinine is 1.74, baseline is around 1.1 currently down to 1.3. Continue to monitor  4. hypertension-blood pressure is stable, will hold furosemide, Bystolic, amlodipine 5. Coagulopathy-secondary to Xarelto, INR elevated at 3.70, patient given 1 dose of vitamin K 10 mg IV, Kcentra.  continue to hold Xarelto in the setting of GI bleed. 6. Chronic atrial  fibrillation-we will hold anticoagulation at this time due to above. Monitor patient closely in stepdown unit. 7. CAD-stable, hold aspirin.      DVT prophylaxis: SCDs  Code Status: Full  Family Communication: Wife and son  Disposition Plan: Likely 24 to 48 hours if no further bleeding   Consultants:   IR   Procedures:   Angiography today   Subjective: Patient feeling better.  He reports no melena for over 12 hours having very clear loose stools at this time.  Denies any pain.  Objective: Vitals:   03/28/18 0138 03/28/18 0238 03/28/18 0401 03/28/18 0458  BP: 134/65 103/65 123/73 123/73  Pulse: 68 78 (!) 59 62  Resp: 18 (!) 21 16 16   Temp:   98.2 F (36.8 C) 97.7 F (36.5 C)  TempSrc:   Oral Oral  SpO2: 100% 92% 97% 100%  Weight:      Height:        Intake/Output Summary (Last 24 hours) at 03/28/2018 0922 Last data filed at 03/28/2018 0816 Gross per 24 hour  Intake 727.67 ml  Output 1000 ml  Net -272.33 ml   Filed Weights   03/26/18 0245 03/26/18 0906  Weight: 78.9 kg (174 lb) 80 kg (176 lb 5.9 oz)    Examination:  General exam: Appears calm and comfortable  Respiratory system: Clear to auscultation. Respiratory effort normal. Cardiovascular system: S1 & S2 heard, RRR. No JVD, murmurs, rubs, gallops or clicks. No pedal edema. Gastrointestinal system: Abdomen is nondistended, soft and nontender. No organomegaly or masses felt. Normal bowel sounds heard. Central nervous system: Alert and oriented. No focal neurological deficits. Extremities: Symmetric 5 x 5 power. Skin: No rashes,  lesions or ulcers Psychiatry: Judgement and insight appear normal. Mood & affect appropriate.     Data Reviewed: I have personally reviewed following labs and imaging studies  CBC: Recent Labs  Lab 03/26/18 0250 03/26/18 1110  03/26/18 2139 03/27/18 0333 03/27/18 0924 03/27/18 1814 03/28/18 0505  WBC 14.5* 14.9*  --   --   --   --   --   --   HGB 4.2* 6.4*   < > 7.4*  6.7* 6.9* 9.0* 7.4*  HCT 13.8* 20.3*   < > 22.9* 21.1* 21.6* 27.7* 22.9*  MCV 95.8 91.0  --   --   --   --   --   --   PLT 342 316  --   --   --   --   --   --    < > = values in this interval not displayed.   Basic Metabolic Panel: Recent Labs  Lab 03/26/18 0250 03/26/18 1110 03/27/18 0333  NA 136 141 141  K 4.7 5.3* 4.8  CL 110 115* 117*  CO2 13* 16* 18*  GLUCOSE 203* 134* 118*  BUN 65* 67* 53*  CREATININE 1.74* 1.55* 1.31*  CALCIUM 7.6* 7.9* 7.5*   GFR: Estimated Creatinine Clearance: 44.2 mL/min (A) (by C-G formula based on SCr of 1.31 mg/dL (H)). Liver Function Tests: Recent Labs  Lab 03/26/18 0250 03/26/18 1110 03/27/18 0333  AST 22 23 24   ALT 18 18 16*  ALKPHOS 100 100 81  BILITOT 0.5 1.9* 1.5*  PROT 5.6* 5.8* 4.8*  ALBUMIN 2.7* 2.8* 2.4*   No results for input(s): LIPASE, AMYLASE in the last 168 hours. No results for input(s): AMMONIA in the last 168 hours. Coagulation Profile: Recent Labs  Lab 03/26/18 0250 03/26/18 0732 03/27/18 1302 03/28/18 0505  INR 3.70 2.64 1.75 1.48   Cardiac Enzymes: No results for input(s): CKTOTAL, CKMB, CKMBINDEX, TROPONINI in the last 168 hours. BNP (last 3 results) No results for input(s): PROBNP in the last 8760 hours. HbA1C: No results for input(s): HGBA1C in the last 72 hours. CBG: Recent Labs  Lab 03/27/18 2118  GLUCAP 142*   Lipid Profile: No results for input(s): CHOL, HDL, LDLCALC, TRIG, CHOLHDL, LDLDIRECT in the last 72 hours. Thyroid Function Tests: No results for input(s): TSH, T4TOTAL, FREET4, T3FREE, THYROIDAB in the last 72 hours. Anemia Panel: No results for input(s): VITAMINB12, FOLATE, FERRITIN, TIBC, IRON, RETICCTPCT in the last 72 hours. Sepsis Labs: No results for input(s): PROCALCITON, LATICACIDVEN in the last 168 hours.  Recent Results (from the past 240 hour(s))  MRSA PCR Screening     Status: None   Collection Time: 03/26/18  8:55 AM  Result Value Ref Range Status   MRSA by PCR  NEGATIVE NEGATIVE Final    Comment:        The GeneXpert MRSA Assay (FDA approved for NASAL specimens only), is one component of a comprehensive MRSA colonization surveillance program. It is not intended to diagnose MRSA infection nor to guide or monitor treatment for MRSA infections. Performed at [facility], [address], [place], [place] [address]          Radiology Studies: Nm Gi Blood Loss  Result Date: 03/26/2018 CLINICAL DATA:  GI bleed EXAM: NUCLEAR MEDICINE GASTROINTESTINAL BLEEDING SCAN TECHNIQUE: Sequential abdominal images were obtained following intravenous administration of Tc-105mlabeled red blood cells. RADIOPHARMACEUTICALS:  25.6 mCi Tc-942mertechnetate in-vitro labeled red cells. COMPARISON:  CT abdomen and pelvis 10/29/2016 FINDINGS: Beginning on the first image, a  focus of abnormal intestinal localization of labeled red cells is identified in the LEFT mid abdomen. Over the course of the first hour imaging this transits curvilinear bowel loops in the LEFT mid abdomen. Findings are consistent with a small bowel origin of active gastrointestinal bleeding. IMPRESSION: Positive GI bleeding scan for presence of an active source of GI bleeding in the LEFT mid abdomen in a small bowel loop likely jejunal. Findings called to Dr. Laural Golden on 03/26/2018 at 1613 hours. Electronically Signed   By: Lavonia Dana M.D.   On: 03/26/2018 16:15        Scheduled Meds: . Difluprednate  1 drop Both Eyes BID  . [START ON 03/29/2018] pantoprazole  40 mg Intravenous Q12H   Continuous Infusions: . sodium chloride 10 mL/hr at 03/26/18 0953  . sodium chloride Stopped (03/27/18 1048)  . pantoprozole (PROTONIX) infusion 8 mg/hr (03/27/18 2031)     LOS: 2 days    Time spent: 25 minutes    Juniper Snyders A, MD [facility] Pager [phone]  If 7PM-7AM, please contact night-coverage [email or web address] Password [email or web address] 03/28/2018, 9:22 AM

## 2018-03-29 DIAGNOSIS — K922 Gastrointestinal hemorrhage, unspecified: Secondary | ICD-10-CM

## 2018-03-29 LAB — PREPARE RBC (CROSSMATCH)

## 2018-03-29 LAB — BASIC METABOLIC PANEL
ANION GAP: 6 (ref 5–15)
BUN: 26 mg/dL — ABNORMAL HIGH (ref 6–20)
CHLORIDE: 115 mmol/L — AB (ref 101–111)
CO2: 20 mmol/L — AB (ref 22–32)
Calcium: 7.7 mg/dL — ABNORMAL LOW (ref 8.9–10.3)
Creatinine, Ser: 1.16 mg/dL (ref 0.61–1.24)
GFR calc Af Amer: >60 mL/min (ref >60)
GFR calc non Af Amer: 58 mL/min — ABNORMAL LOW (ref >60)
Glucose, Bld: 153 mg/dL — ABNORMAL HIGH (ref 65–99)
Potassium: 3.8 mmol/L (ref 3.5–5.1)
Sodium: 141 mmol/L (ref 135–145)

## 2018-03-29 LAB — CBC
HEMATOCRIT: 27.8 % — AB (ref 39.0–52.0)
HEMOGLOBIN: 8.8 g/dL — AB (ref 13.0–17.0)
MCH: 28.1 pg (ref 26.0–34.0)
MCHC: 31.7 g/dL (ref 30.0–36.0)
MCV: 88.8 fL (ref 78.0–100.0)
Platelets: 180 10*3/uL (ref 150–400)
RBC: 3.13 MIL/uL — ABNORMAL LOW (ref 4.22–5.81)
RDW: 19.5 % — ABNORMAL HIGH (ref 11.5–15.5)
WBC: 6.8 10*3/uL (ref 4.0–10.5)

## 2018-03-29 LAB — HEMOGLOBIN AND HEMATOCRIT, BLOOD
HCT: 21.9 % — ABNORMAL LOW (ref 39.0–52.0)
HCT: 29.9 % — ABNORMAL LOW (ref 39.0–52.0)
HEMOGLOBIN: 9.4 g/dL — AB (ref 13.0–17.0)
Hemoglobin: 6.9 g/dL — CL (ref 13.0–17.0)

## 2018-03-29 MED ORDER — SODIUM CHLORIDE 0.9 % IV SOLN: Freq: Once | INTRAVENOUS | Status: DC

## 2018-03-29 MED ORDER — SODIUM CHLORIDE 0.9 % IV SOLN
Freq: Once | INTRAVENOUS | Status: AC
  Administered 2018-03-29: 03:00:00 via INTRAVENOUS

## 2018-03-29 NOTE — Progress Notes (Signed)
Referring Physician(s): Dr Laural Golden  Supervising Physician: Markus Daft  Patient Status:  [facility] - In-pt  Chief Complaint:  GI Bleed   Subjective:  6/6/arteriogram: IMPRESSION: Negative mesenteric arteriogram. No evidence for active bleeding or vascular malformation involving the celiac or SMA arteries.  Pt feeling well No BM yet-- since was at [facility] Eating only fluids/clear liquids Up in bed--- around room No bleeding from rectum for few days now Did note some hematuria this am-- minimal; no pain Denies abd pain   Allergies: Lipitor [atorvastatin]; Simvastatin; Penicillins; and Sulfa antibiotics  Medications: Prior to Admission medications   Medication Sig Start Date End Date Taking? Authorizing Provider  amLODipine (NORVASC) 10 MG tablet Take 1 tablet (10 mg total) by mouth daily. 04/02/17  Yes Herminio Commons, MD  aspirin EC 81 MG tablet Take 81 mg by mouth daily.   Yes [provider]  cetirizine (ZYRTEC) 10 MG tablet Take 10 mg by mouth daily.   Yes [provider]  Difluprednate (DUREZOL) 0.05 % EMUL Place 1 drop into both eyes 2 (two) times daily.    Yes [provider]  feeding supplement (BOOST / RESOURCE BREEZE) LIQD Take 1 Container by mouth 3 (three) times daily between meals. Patient taking differently: Take 1 Container by mouth 2 (two) times daily.  11/16/15  Yes Loleta Chance, MD  ferrous sulfate 325 (65 FE) MG tablet Take 65 mg by mouth daily with breakfast.   Yes [provider]  losartan-hydrochlorothiazide (HYZAAR) 100-12.5 MG tablet Take 1 tablet by mouth daily.   Yes [provider]  lovastatin (MEVACOR) 10 MG tablet Take 10 mg by mouth daily.    Yes [provider]  Multiple Vitamins-Minerals (CENTRUM SILVER PO) Take 1 tablet by mouth daily.     Yes [provider]  nebivolol (BYSTOLIC) 10 MG tablet Take 10 mg by mouth daily.   Yes [provider]  nystatin cream (MYCOSTATIN)  Apply 1 application topically 3 (three) times daily.   Yes [provider]  Omega-3 Fatty Acids (FISH OIL) 1200 MG CAPS Take 1,200 mg by mouth daily.   Yes [provider]  pantoprazole (PROTONIX) 40 MG tablet Take 1 tablet (40 mg total) by mouth daily. 02/15/18  Yes Tat, Shanon Brow, MD  Pediatric Multivitamins-Iron (FLINTSTONES PLUS IRON PO) Take 1 tablet by mouth daily.   Yes [provider]  Probiotic Product ([place]) Take 1 capsule by mouth daily.    Yes [provider]  rivaroxaban (XARELTO) 20 MG TABS tablet Take 1 tablet (20 mg total) by mouth daily with supper. 02/26/18  Yes Rehman, Mechele Dawley, MD  silver sulfADIAZINE (SILVADENE) 1 % cream Apply topically daily. Patient taking differently: Apply 1 application topically daily. Applied to affected area of foot. 11/16/15  Yes Loleta Chance, MD  vitamin B-12 (CYANOCOBALAMIN) 1000 MCG tablet Take 2,000 mcg by mouth every morning.   Yes [provider]     Vital Signs: BP (!) 127/50   Pulse 73   Temp 97.9 F (36.6 C) (Oral)   Resp 17   Ht 5' 8"  (1.727 m)   Wt 176 lb 5.9 oz (80 kg)   SpO2 99%   BMI 26.82 kg/m   Physical Exam  Pulmonary/Chest: Effort normal and breath sounds normal.  Abdominal: Soft. Bowel sounds are normal.  Neurological: He is alert.  Skin: Skin is warm and dry.  Left groin site is clean and ry NT no bleeding No hematoma'  Vitals reviewed.   Imaging: Nm Gi Blood Loss  Result Date: 03/26/2018 CLINICAL DATA:  GI bleed EXAM: NUCLEAR MEDICINE GASTROINTESTINAL BLEEDING SCAN TECHNIQUE: Sequential abdominal images were obtained following intravenous administration of Tc-9mlabeled red blood cells. RADIOPHARMACEUTICALS:  25.6 mCi Tc-933mertechnetate in-vitro labeled red cells. COMPARISON:  CT abdomen and pelvis 10/29/2016 FINDINGS: Beginning on the first image, a focus of abnormal intestinal localization of labeled red cells is identified in the LEFT mid abdomen.  Over the course of the first hour imaging this transits curvilinear bowel loops in the LEFT mid abdomen. Findings are consistent with a small bowel origin of active gastrointestinal bleeding. IMPRESSION: Positive GI bleeding scan for presence of an active source of GI bleeding in the LEFT mid abdomen in a small bowel loop likely jejunal. Findings called to Dr. ReLaural Goldenn 03/26/2018 at 1613 hours. Electronically Signed   By: MaLavonia Dana.D.   On: 03/26/2018 16:15   Ir Angiogram Visceral Selective  Result Date: 03/28/2018 INDICATION: [age]ear old with GI bleeding. The suspected bleeding source is from the proximal jejunum. Patient continues to have decreasing hemoglobin level. EXAM: MESENTERIC ARTERIOGRAM WITH SELECTION OF THE CELIAC TRUNK AND SMA ULTRASOUND GUIDANCE FOR VASCULAR ACCESS MEDICATIONS: None ANESTHESIA/SEDATION: Moderate (conscious) sedation was employed during this procedure. A total of Versed 2.0 mg and Fentanyl 100 mcg was administered intravenously. Moderate Sedation Time: 43 minutes. The patient's level of consciousness and vital signs were monitored continuously by radiology nursing throughout the procedure under my direct supervision. CONTRAST:  75 mL Isovue-300 FLUOROSCOPY TIME:  Fluoroscopy Time: 8 minutes, 19951Gy COMPLICATIONS: None immediate. PROCEDURE: Informed consent was obtained from the patient following explanation of the procedure, risks, benefits and alternatives. The patient understands, agrees and consents for the procedure. All questions were addressed. A time out was performed prior to the initiation of the procedure. Maximal barrier sterile technique utilized including caps, mask, sterile gowns, sterile gloves, large sterile drape, hand hygiene, and Betadine prep. Ultrasound confirmed a patent left common femoral artery. Ultrasound image was taken and saved for documentation. Left groin was selected for access based on previous left leg amputation. Left groin was prepped and  draped in sterile fashion. Skin was anesthetized with 1% lidocaine. 21 gauge needle directed into the left common femoral artery with ultrasound guidance and a micropuncture dilator set was placed. A 5 French vascular sheath was placed over a Bentson wire. A C2 catheter was used to select the celiac trunk and the SMA. Celiac arteriography and SMA arteriography was performed. The C2 catheter was exchanged for a Sos catheter and attempted to cannulate the IMA but this was found to be technically difficult. IMA was thought to be an unlikely source of the bleeding based on the previous imaging and endoscopy, therefore, additional attempts to cannulate the IMA were not made. Final angiogram was performed through the left groin sheath. The left groin sheath was removed using an ExoSeal closure device. Hemostasis at the left groin at the end of the procedure. Fluoroscopic images were taken and saved for this procedure. FINDINGS: Celiac trunk is patent with typical anatomy. The left gastric artery, splenic artery and common hepatic artery are patent. No gross abnormality involving the GDA. No active bleeding or vascular malformations associated with the celiac arterial distribution. Atherosclerotic disease involving the splenic artery. Splenic vein and portal venous system are patent. SMA and main branches are patent. No evidence for active bleeding or vascular malformation involving the SMA. SMV and portal venous system are patent. IMPRESSION: Negative  mesenteric arteriogram. No evidence for active bleeding or vascular malformation involving the celiac or SMA arteries. Electronically Signed   By: Markus Daft M.D.   On: 03/28/2018 17:45   Ir Angiogram Visceral Selective  Result Date: 03/28/2018 INDICATION: 82 year old with GI bleeding. The suspected bleeding source is from the proximal jejunum. Patient continues to have decreasing hemoglobin level. EXAM: MESENTERIC ARTERIOGRAM WITH SELECTION OF THE CELIAC TRUNK AND SMA  ULTRASOUND GUIDANCE FOR VASCULAR ACCESS MEDICATIONS: None ANESTHESIA/SEDATION: Moderate (conscious) sedation was employed during this procedure. A total of Versed 2.0 mg and Fentanyl 100 mcg was administered intravenously. Moderate Sedation Time: 43 minutes. The patient's level of consciousness and vital signs were monitored continuously by radiology nursing throughout the procedure under my direct supervision. CONTRAST:  75 mL Isovue-300 FLUOROSCOPY TIME:  Fluoroscopy Time: 8 minutes, 376 mGy COMPLICATIONS: None immediate. PROCEDURE: Informed consent was obtained from the patient following explanation of the procedure, risks, benefits and alternatives. The patient understands, agrees and consents for the procedure. All questions were addressed. A time out was performed prior to the initiation of the procedure. Maximal barrier sterile technique utilized including caps, mask, sterile gowns, sterile gloves, large sterile drape, hand hygiene, and Betadine prep. Ultrasound confirmed a patent left common femoral artery. Ultrasound image was taken and saved for documentation. Left groin was selected for access based on previous left leg amputation. Left groin was prepped and draped in sterile fashion. Skin was anesthetized with 1% lidocaine. 21 gauge needle directed into the left common femoral artery with ultrasound guidance and a micropuncture dilator set was placed. A 5 French vascular sheath was placed over a Bentson wire. A C2 catheter was used to select the celiac trunk and the SMA. Celiac arteriography and SMA arteriography was performed. The C2 catheter was exchanged for a Sos catheter and attempted to cannulate the IMA but this was found to be technically difficult. IMA was thought to be an unlikely source of the bleeding based on the previous imaging and endoscopy, therefore, additional attempts to cannulate the IMA were not made. Final angiogram was performed through the left groin sheath. The left groin sheath  was removed using an ExoSeal closure device. Hemostasis at the left groin at the end of the procedure. Fluoroscopic images were taken and saved for this procedure. FINDINGS: Celiac trunk is patent with typical anatomy. The left gastric artery, splenic artery and common hepatic artery are patent. No gross abnormality involving the GDA. No active bleeding or vascular malformations associated with the celiac arterial distribution. Atherosclerotic disease involving the splenic artery. Splenic vein and portal venous system are patent. SMA and main branches are patent. No evidence for active bleeding or vascular malformation involving the SMA. SMV and portal venous system are patent. IMPRESSION: Negative mesenteric arteriogram. No evidence for active bleeding or vascular malformation involving the celiac or SMA arteries. Electronically Signed   By: Markus Daft M.D.   On: 03/28/2018 17:45   Ir US Guide Vasc Access Left  Result Date: 03/28/2018 INDICATION: 82 year old with GI bleeding. The suspected bleeding source is from the proximal jejunum. Patient continues to have decreasing hemoglobin level. EXAM: MESENTERIC ARTERIOGRAM WITH SELECTION OF THE CELIAC TRUNK AND SMA ULTRASOUND GUIDANCE FOR VASCULAR ACCESS MEDICATIONS: None ANESTHESIA/SEDATION: Moderate (conscious) sedation was employed during this procedure. A total of Versed 2.0 mg and Fentanyl 100 mcg was administered intravenously. Moderate Sedation Time: 43 minutes. The patient's level of consciousness and vital signs were monitored continuously by radiology nursing throughout the procedure under my direct supervision.  CONTRAST:  75 mL Isovue-300 FLUOROSCOPY TIME:  Fluoroscopy Time: 8 minutes, 389 mGy COMPLICATIONS: None immediate. PROCEDURE: Informed consent was obtained from the patient following explanation of the procedure, risks, benefits and alternatives. The patient understands, agrees and consents for the procedure. All questions were addressed. A time out  was performed prior to the initiation of the procedure. Maximal barrier sterile technique utilized including caps, mask, sterile gowns, sterile gloves, large sterile drape, hand hygiene, and Betadine prep. Ultrasound confirmed a patent left common femoral artery. Ultrasound image was taken and saved for documentation. Left groin was selected for access based on previous left leg amputation. Left groin was prepped and draped in sterile fashion. Skin was anesthetized with 1% lidocaine. 21 gauge needle directed into the left common femoral artery with ultrasound guidance and a micropuncture dilator set was placed. A 5 French vascular sheath was placed over a Bentson wire. A C2 catheter was used to select the celiac trunk and the SMA. Celiac arteriography and SMA arteriography was performed. The C2 catheter was exchanged for a Sos catheter and attempted to cannulate the IMA but this was found to be technically difficult. IMA was thought to be an unlikely source of the bleeding based on the previous imaging and endoscopy, therefore, additional attempts to cannulate the IMA were not made. Final angiogram was performed through the left groin sheath. The left groin sheath was removed using an ExoSeal closure device. Hemostasis at the left groin at the end of the procedure. Fluoroscopic images were taken and saved for this procedure. FINDINGS: Celiac trunk is patent with typical anatomy. The left gastric artery, splenic artery and common hepatic artery are patent. No gross abnormality involving the GDA. No active bleeding or vascular malformations associated with the celiac arterial distribution. Atherosclerotic disease involving the splenic artery. Splenic vein and portal venous system are patent. SMA and main branches are patent. No evidence for active bleeding or vascular malformation involving the SMA. SMV and portal venous system are patent. IMPRESSION: Negative mesenteric arteriogram. No evidence for active bleeding or  vascular malformation involving the celiac or SMA arteries. Electronically Signed   By: Markus Daft M.D.   On: 03/28/2018 17:45    Labs:  CBC: Recent Labs    02/22/18 0956 03/07/18 0840 03/26/18 0250 03/26/18 1110  03/28/18 0505 03/28/18 1253 03/28/18 1848 03/29/18 0055  WBC 6.8 5.4 14.5* 14.9*  --   --   --   --   --   HGB 8.6* 8.9* 4.2* 6.4*   < > 7.4* 7.5* 7.6* 6.9*  HCT 25.8* 27.0* 13.8* 20.3*   < > 22.9* 23.5* 24.5* 21.9*  PLT 330 382 342 316  --   --   --   --   --    < > = values in this interval not displayed.    COAGS: Recent Labs    03/26/18 0250 03/26/18 0732 03/27/18 1302 03/28/18 0505  INR 3.70 2.64 1.75 1.48    BMP: Recent Labs    02/22/18 0956 03/07/18 0840 03/26/18 0250 03/26/18 1110 03/27/18 0333  NA 139 143 136 141 141  K 4.0 4.1 4.7 5.3* 4.8  CL 112* 110 110 115* 117*  CO2 17* 24 13* 16* 18*  GLUCOSE 101* 102* 203* 134* 118*  BUN 21* 16 65* 67* 53*  CALCIUM 7.5* 8.0* 7.6* 7.9* 7.5*  CREATININE 1.17 1.17 1.74* 1.55* 1.31*  GFRNONAA 57*  --  36* 41* 50*  GFRAA >60  --  41* 47* 58*  LIVER FUNCTION TESTS: Recent Labs    03/26/18 0250 03/26/18 1110 03/27/18 0333  BILITOT 0.5 1.9* 1.5*  AST 22 23 24   ALT 18 18 16*  ALKPHOS 100 100 81  PROT 5.6* 5.8* 4.8*  ALBUMIN 2.7* 2.8* 2.4*    Assessment and Plan:  GI bleed Negative arteriogram yesterday No bleeding from rectum for few days Hg in 7 range Plan per [facility]/Dr Laural Golden  Electronically Signed: Stein Windhorst A, PA-C 03/29/2018, 8:51 AM   I spent a total of 25 Minutes at the the patient's bedside AND on the patient's hospital floor or unit, greater than 50% of which was counseling/coordinating care for mesenteric arteriogram

## 2018-03-29 NOTE — Progress Notes (Signed)
Patient ID: [name], male   DOB: 1936-04-16, 82 y.o.   MRN: [id number]  PROGRESS NOTE    [name]  [name] DOB: [birth year] DOA: 03/26/2018 PCP: Lemmie Evens, MD   Brief Narrative: PhilipYoungis a 82 y.o.male,with history of atrial fibrillation, diet-controlled diabetes mellitus, hypertension, peripheral vascular disease, CAD, hyperlipidemia came to hospital with complaints of shortness of breath. Patient was recently admitted for melena at that time upper GI endoscopy showed focal erosive gastritis without bleeding stigmata, bulbar duodenitis without bleeding. Patient at that time received 2 units PRBC and was discharged home on Xarelto. Patient states that over the past 1 week he has noticed black-colored stool , and became progressive short of breath.     Assessment & Plan:   Active Problems:   Essential hypertension   Permanent atrial fibrillation (HCC)   Coronary artery disease involving coronary bypass graft of native heart without angina pectoris   Upper GI bleed   Melena   Anemia   AKI (acute kidney injury) ([place])   Gastrointestinal hemorrhage  MDM/Assessment & Plan:   1. Acute blood loss Anemia-l GI bleed, from - Capsule study does demonstrate active GI bleeding.  6 units of PRBC have been ordered and transfused. Angiography was done yesterday by IR and was normal.  Patient has had no further bleeding for over 24 hours.   2. Melena-patient complains of black stool actually, FOBT is positive in the ED. patient had EGD in April 2019 which showed bulbar duodenitis, erosive gastritis.  Holding aspirin. 3. Acute kidney injury-peak creatinine is 1.74, now normalized.  Secondary to prerenal azotemia from volume losses.   4. Hypertension-blood pressure is stable, will hold furosemide, Bystolic, amlodipine for now. 5. Coagulopathy-secondary to Xarelto, INR elevated at 3.70, patient given 1 dose of vitamin K 10 mg IV, Kcentra.  continue to hold Xarelto in  the setting of GI bleed. 6. Chronic atrial fibrillation-we will hold anticoagulation at this time due to above. Monitor patient closely in stepdown unit. 7. CAD-stable, hold aspirin.   Spoke to Dr. Corbin Ade in [place] today who is his GI doctor.  Dr. Corbin Ade is already set up a referral to [facility] for further evaluation.  Will have patient follow-up with Dr. Despina Hidden in 1 to 2 weeks at discharge.  Corbin Ade is in very close contact with patient and his family.   DVT prophylaxis: SCDs  Code Status: Full  Family Communication: Wife and son  Disposition Plan: Likely 24 hours if no further bleeding and hemoglobin remained stable  Consultants:   IR   Procedures:   Angiography today   Subjective: No further bleeding.  Objective: Vitals:   03/29/18 0615 03/29/18 0645 03/29/18 1023 03/29/18 1405  BP: (!) 136/48 (!) 127/50 (!) 141/67 138/61  Pulse: 71 73 72   Resp: 16 17 20    Temp: (!) 97.4 F (36.3 C) 97.9 F (36.6 C) 97.7 F (36.5 C) 97.9 F (36.6 C)  TempSrc: Oral Oral Oral Oral  SpO2: 100% 99% 96%   Weight:      Height:        Intake/Output Summary (Last 24 hours) at 03/29/2018 1509 Last data filed at 03/29/2018 1026 Gross per 24 hour  Intake 1358.5 ml  Output -  Net 1358.5 ml   Filed Weights   03/26/18 0245 03/26/18 0906  Weight: 78.9 kg (174 lb) 80 kg (176 lb 5.9 oz)    Examination:  General exam: Appears calm and comfortable  Respiratory system: Clear to auscultation. Respiratory effort  normal. Cardiovascular system: S1 & S2 heard, RRR. No JVD, murmurs, rubs, gallops or clicks. No pedal edema. Gastrointestinal system: Abdomen is nondistended, soft and nontender. No organomegaly or masses felt. Normal bowel sounds heard. Central nervous system: Alert and oriented. No focal neurological deficits. Extremities: Symmetric 5 x 5 power. Skin: No rashes, lesions or ulcers Psychiatry: Judgement and insight appear normal. Mood & affect appropriate.     Data Reviewed:  I have personally reviewed following labs and imaging studies  CBC: Recent Labs  Lab 03/26/18 0250 03/26/18 1110  03/28/18 0505 03/28/18 1253 03/28/18 1848 03/29/18 0055 03/29/18 1218  WBC 14.5* 14.9*  --   --   --   --   --  6.8  HGB 4.2* 6.4*   < > 7.4* 7.5* 7.6* 6.9* 8.8*  HCT 13.8* 20.3*   < > 22.9* 23.5* 24.5* 21.9* 27.8*  MCV 95.8 91.0  --   --   --   --   --  88.8  PLT 342 316  --   --   --   --   --  180   < > = values in this interval not displayed.   Basic Metabolic Panel: Recent Labs  Lab 03/26/18 0250 03/26/18 1110 03/27/18 0333 03/29/18 1218  NA 136 141 141 141  K 4.7 5.3* 4.8 3.8  CL 110 115* 117* 115*  CO2 13* 16* 18* 20*  GLUCOSE 203* 134* 118* 153*  BUN 65* 67* 53* 26*  CREATININE 1.74* 1.55* 1.31* 1.16  CALCIUM 7.6* 7.9* 7.5* 7.7*   GFR: Estimated Creatinine Clearance: 50 mL/min (by C-G formula based on SCr of 1.16 mg/dL). Liver Function Tests: Recent Labs  Lab 03/26/18 0250 03/26/18 1110 03/27/18 0333  AST 22 23 24   ALT 18 18 16*  ALKPHOS 100 100 81  BILITOT 0.5 1.9* 1.5*  PROT 5.6* 5.8* 4.8*  ALBUMIN 2.7* 2.8* 2.4*   No results for input(s): LIPASE, AMYLASE in the last 168 hours. No results for input(s): AMMONIA in the last 168 hours. Coagulation Profile: Recent Labs  Lab 03/26/18 0250 03/26/18 0732 03/27/18 1302 03/28/18 0505  INR 3.70 2.64 1.75 1.48   Cardiac Enzymes: No results for input(s): CKTOTAL, CKMB, CKMBINDEX, TROPONINI in the last 168 hours. BNP (last 3 results) No results for input(s): PROBNP in the last 8760 hours. HbA1C: No results for input(s): HGBA1C in the last 72 hours. CBG: Recent Labs  Lab 03/27/18 2118  GLUCAP 142*   Lipid Profile: No results for input(s): CHOL, HDL, LDLCALC, TRIG, CHOLHDL, LDLDIRECT in the last 72 hours. Thyroid Function Tests: No results for input(s): TSH, T4TOTAL, FREET4, T3FREE, THYROIDAB in the last 72 hours. Anemia Panel: No results for input(s): VITAMINB12, FOLATE, FERRITIN,  TIBC, IRON, RETICCTPCT in the last 72 hours. Sepsis Labs: No results for input(s): PROCALCITON, LATICACIDVEN in the last 168 hours.  Recent Results (from the past 240 hour(s))  MRSA PCR Screening     Status: None   Collection Time: 03/26/18  8:55 AM  Result Value Ref Range Status   MRSA by PCR NEGATIVE NEGATIVE Final    Comment:        The GeneXpert MRSA Assay (FDA approved for NASAL specimens only), is one component of a comprehensive MRSA colonization surveillance program. It is not intended to diagnose MRSA infection nor to guide or monitor treatment for MRSA infections. Performed at [facility], [address] [place], [place] [address]          Radiology Studies: Ir Angiogram  Visceral Selective  Result Date: 03/28/2018 INDICATION: 82 year old with GI bleeding. The suspected bleeding source is from the proximal jejunum. Patient continues to have decreasing hemoglobin level. EXAM: MESENTERIC ARTERIOGRAM WITH SELECTION OF THE CELIAC TRUNK AND SMA ULTRASOUND GUIDANCE FOR VASCULAR ACCESS MEDICATIONS: None ANESTHESIA/SEDATION: Moderate (conscious) sedation was employed during this procedure. A total of Versed 2.0 mg and Fentanyl 100 mcg was administered intravenously. Moderate Sedation Time: 43 minutes. The patient's level of consciousness and vital signs were monitored continuously by radiology nursing throughout the procedure under my direct supervision. CONTRAST:  75 mL Isovue-300 FLUOROSCOPY TIME:  Fluoroscopy Time: 8 minutes, 536 mGy COMPLICATIONS: None immediate. PROCEDURE: Informed consent was obtained from the patient following explanation of the procedure, risks, benefits and alternatives. The patient understands, agrees and consents for the procedure. All questions were addressed. A time out was performed prior to the initiation of the procedure. Maximal barrier sterile technique utilized including caps, mask, sterile gowns, sterile gloves, large sterile drape, hand hygiene,  and Betadine prep. Ultrasound confirmed a patent left common femoral artery. Ultrasound image was taken and saved for documentation. Left groin was selected for access based on previous left leg amputation. Left groin was prepped and draped in sterile fashion. Skin was anesthetized with 1% lidocaine. 21 gauge needle directed into the left common femoral artery with ultrasound guidance and a micropuncture dilator set was placed. A 5 French vascular sheath was placed over a Bentson wire. A C2 catheter was used to select the celiac trunk and the SMA. Celiac arteriography and SMA arteriography was performed. The C2 catheter was exchanged for a Sos catheter and attempted to cannulate the IMA but this was found to be technically difficult. IMA was thought to be an unlikely source of the bleeding based on the previous imaging and endoscopy, therefore, additional attempts to cannulate the IMA were not made. Final angiogram was performed through the left groin sheath. The left groin sheath was removed using an ExoSeal closure device. Hemostasis at the left groin at the end of the procedure. Fluoroscopic images were taken and saved for this procedure. FINDINGS: Celiac trunk is patent with typical anatomy. The left gastric artery, splenic artery and common hepatic artery are patent. No gross abnormality involving the GDA. No active bleeding or vascular malformations associated with the celiac arterial distribution. Atherosclerotic disease involving the splenic artery. Splenic vein and portal venous system are patent. SMA and main branches are patent. No evidence for active bleeding or vascular malformation involving the SMA. SMV and portal venous system are patent. IMPRESSION: Negative mesenteric arteriogram. No evidence for active bleeding or vascular malformation involving the celiac or SMA arteries. Electronically Signed   By: Markus Daft M.D.   On: 03/28/2018 17:45   Ir Angiogram Visceral Selective  Result Date:  03/28/2018 INDICATION: 82 year old with GI bleeding. The suspected bleeding source is from the proximal jejunum. Patient continues to have decreasing hemoglobin level. EXAM: MESENTERIC ARTERIOGRAM WITH SELECTION OF THE CELIAC TRUNK AND SMA ULTRASOUND GUIDANCE FOR VASCULAR ACCESS MEDICATIONS: None ANESTHESIA/SEDATION: Moderate (conscious) sedation was employed during this procedure. A total of Versed 2.0 mg and Fentanyl 100 mcg was administered intravenously. Moderate Sedation Time: 43 minutes. The patient's level of consciousness and vital signs were monitored continuously by radiology nursing throughout the procedure under my direct supervision. CONTRAST:  75 mL Isovue-300 FLUOROSCOPY TIME:  Fluoroscopy Time: 8 minutes, 644 mGy COMPLICATIONS: None immediate. PROCEDURE: Informed consent was obtained from the patient following explanation of the procedure, risks, benefits and alternatives. The patient  understands, agrees and consents for the procedure. All questions were addressed. A time out was performed prior to the initiation of the procedure. Maximal barrier sterile technique utilized including caps, mask, sterile gowns, sterile gloves, large sterile drape, hand hygiene, and Betadine prep. Ultrasound confirmed a patent left common femoral artery. Ultrasound image was taken and saved for documentation. Left groin was selected for access based on previous left leg amputation. Left groin was prepped and draped in sterile fashion. Skin was anesthetized with 1% lidocaine. 21 gauge needle directed into the left common femoral artery with ultrasound guidance and a micropuncture dilator set was placed. A 5 French vascular sheath was placed over a Bentson wire. A C2 catheter was used to select the celiac trunk and the SMA. Celiac arteriography and SMA arteriography was performed. The C2 catheter was exchanged for a Sos catheter and attempted to cannulate the IMA but this was found to be technically difficult. IMA was  thought to be an unlikely source of the bleeding based on the previous imaging and endoscopy, therefore, additional attempts to cannulate the IMA were not made. Final angiogram was performed through the left groin sheath. The left groin sheath was removed using an ExoSeal closure device. Hemostasis at the left groin at the end of the procedure. Fluoroscopic images were taken and saved for this procedure. FINDINGS: Celiac trunk is patent with typical anatomy. The left gastric artery, splenic artery and common hepatic artery are patent. No gross abnormality involving the GDA. No active bleeding or vascular malformations associated with the celiac arterial distribution. Atherosclerotic disease involving the splenic artery. Splenic vein and portal venous system are patent. SMA and main branches are patent. No evidence for active bleeding or vascular malformation involving the SMA. SMV and portal venous system are patent. IMPRESSION: Negative mesenteric arteriogram. No evidence for active bleeding or vascular malformation involving the celiac or SMA arteries. Electronically Signed   By: Markus Daft M.D.   On: 03/28/2018 17:45   Ir US Guide Vasc Access Left  Result Date: 03/28/2018 INDICATION: 83 year old with GI bleeding. The suspected bleeding source is from the proximal jejunum. Patient continues to have decreasing hemoglobin level. EXAM: MESENTERIC ARTERIOGRAM WITH SELECTION OF THE CELIAC TRUNK AND SMA ULTRASOUND GUIDANCE FOR VASCULAR ACCESS MEDICATIONS: None ANESTHESIA/SEDATION: Moderate (conscious) sedation was employed during this procedure. A total of Versed 2.0 mg and Fentanyl 100 mcg was administered intravenously. Moderate Sedation Time: 43 minutes. The patient's level of consciousness and vital signs were monitored continuously by radiology nursing throughout the procedure under my direct supervision. CONTRAST:  75 mL Isovue-300 FLUOROSCOPY TIME:  Fluoroscopy Time: 8 minutes, 546 mGy COMPLICATIONS: None  immediate. PROCEDURE: Informed consent was obtained from the patient following explanation of the procedure, risks, benefits and alternatives. The patient understands, agrees and consents for the procedure. All questions were addressed. A time out was performed prior to the initiation of the procedure. Maximal barrier sterile technique utilized including caps, mask, sterile gowns, sterile gloves, large sterile drape, hand hygiene, and Betadine prep. Ultrasound confirmed a patent left common femoral artery. Ultrasound image was taken and saved for documentation. Left groin was selected for access based on previous left leg amputation. Left groin was prepped and draped in sterile fashion. Skin was anesthetized with 1% lidocaine. 21 gauge needle directed into the left common femoral artery with ultrasound guidance and a micropuncture dilator set was placed. A 5 French vascular sheath was placed over a Bentson wire. A C2 catheter was used to select the celiac trunk  and the SMA. Celiac arteriography and SMA arteriography was performed. The C2 catheter was exchanged for a Sos catheter and attempted to cannulate the IMA but this was found to be technically difficult. IMA was thought to be an unlikely source of the bleeding based on the previous imaging and endoscopy, therefore, additional attempts to cannulate the IMA were not made. Final angiogram was performed through the left groin sheath. The left groin sheath was removed using an ExoSeal closure device. Hemostasis at the left groin at the end of the procedure. Fluoroscopic images were taken and saved for this procedure. FINDINGS: Celiac trunk is patent with typical anatomy. The left gastric artery, splenic artery and common hepatic artery are patent. No gross abnormality involving the GDA. No active bleeding or vascular malformations associated with the celiac arterial distribution. Atherosclerotic disease involving the splenic artery. Splenic vein and portal venous  system are patent. SMA and main branches are patent. No evidence for active bleeding or vascular malformation involving the SMA. SMV and portal venous system are patent. IMPRESSION: Negative mesenteric arteriogram. No evidence for active bleeding or vascular malformation involving the celiac or SMA arteries. Electronically Signed   By: Markus Daft M.D.   On: 03/28/2018 17:45        Scheduled Meds: . Difluprednate  1 drop Both Eyes BID  . pantoprazole  40 mg Intravenous Q12H   Continuous Infusions: . sodium chloride 10 mL/hr at 03/29/18 1032  . sodium chloride Stopped (03/27/18 1048)     LOS: 3 days    Time spent: 2 7 minutes    Solange Emry A, MD [facility] Pager [phone]  If 7PM-7AM, please contact night-coverage [email or web address] Password [email or web address] 03/29/2018, 3:09 PM

## 2018-03-29 NOTE — Care Management Important Message (Signed)
Important Message  Patient Details  Name: [name] MRN: [id number] Date of Birth: 12-Oct-1936   Medicare Important Message Given:  Yes    Orbie Pyo 03/29/2018, 4:09 PM

## 2018-03-29 NOTE — Care Management Note (Signed)
Case Management Note  Patient Details  Name: [name] MRN: [id number] Date of Birth: 06-Mar-1936  Subjective/Objective:              Patient from hoe w wife, transferred from [place]. Hx L AKA, has WC, not active w HH. Will continue to follow for DC planning.       Action/Plan:   Expected Discharge Date:                  Expected Discharge Plan:     In-House Referral:     Discharge planning Services     Post Acute Care Choice:    Choice offered to:     DME Arranged:    DME Agency:     HH Arranged:    HH Agency:     Status of Service:     If discussed at [employer] of Stay Meetings, dates discussed:    Additional Comments:  Carles Collet, RN 03/29/2018, 3:45 PM

## 2018-03-30 LAB — HEMOGLOBIN AND HEMATOCRIT, BLOOD
HCT: 25.4 % — ABNORMAL LOW (ref 39.0–52.0)
HCT: 25.6 % — ABNORMAL LOW (ref 39.0–52.0)
HEMATOCRIT: 26.5 % — AB (ref 39.0–52.0)
HEMOGLOBIN: 8.3 g/dL — AB (ref 13.0–17.0)
Hemoglobin: 8 g/dL — ABNORMAL LOW (ref 13.0–17.0)
Hemoglobin: 8.2 g/dL — ABNORMAL LOW (ref 13.0–17.0)

## 2018-03-30 LAB — BASIC METABOLIC PANEL
Anion gap: 6 (ref 5–15)
BUN: 18 mg/dL (ref 6–20)
CO2: 17 mmol/L — ABNORMAL LOW (ref 22–32)
Calcium: 7.5 mg/dL — ABNORMAL LOW (ref 8.9–10.3)
Chloride: 113 mmol/L — ABNORMAL HIGH (ref 101–111)
Creatinine, Ser: 1.02 mg/dL (ref 0.61–1.24)
GFR calc Af Amer: >60 mL/min (ref >60)
GFR calc non Af Amer: >60 mL/min (ref >60)
Glucose, Bld: 110 mg/dL — ABNORMAL HIGH (ref 65–99)
Potassium: 3.7 mmol/L (ref 3.5–5.1)
Sodium: 136 mmol/L (ref 135–145)

## 2018-03-30 NOTE — Progress Notes (Signed)
Spoke w patient and wife. They describe fully handicap accessible home and decline HH and DME

## 2018-03-30 NOTE — Progress Notes (Signed)
[name] to be D/C'd Home per MD order.  Discussed with the patient and all questions fully answered.  VSS, Skin clean, dry and intact without evidence of skin break down, no evidence of skin tears noted. IV catheter discontinued intact. Site without signs and symptoms of complications. Dressing and pressure applied.  An After Visit Summary was printed and given to the patient. Patient received prescription.  D/c education completed with patient/family including follow up instructions, medication list, d/c activities limitations if indicated, with other d/c instructions as indicated by MD - patient able to verbalize understanding, all questions fully answered.   Patient instructed to return to ED, call 911, or call MD for any changes in condition.   Pt is waiting for their son to come pick them up. Will continue to assess.  Christoper Fabian Malacai Grantz 03/30/2018 2:40 PM

## 2018-03-30 NOTE — Progress Notes (Signed)
Pt taken to exit via wheelchair and D/C'd home in private auto.

## 2018-03-30 NOTE — Discharge Summary (Signed)
Physician Discharge Summary  [name] ZOX:[id number] DOB: 1935/12/20 DOA: 03/26/2018  PCP: Lemmie Evens, MD  Admit date: 03/26/2018 Discharge date: 03/30/2018  Time spent: 36 minutes  Recommendations for Outpatient Follow-up:  Follow-up PCP 1 week  Follow-up with Dr. Melony Overly in 1 to 2 weeks appointment not made prior to discharge as ordered  Discharge Diagnoses:  Active Problems:   Essential hypertension   Permanent atrial fibrillation (HCC)   Coronary artery disease involving coronary bypass graft of native heart without angina pectoris   Upper GI bleed   Melena   Anemia   AKI (acute kidney injury) ([place])   Gastrointestinal hemorrhage   Discharge Condition: Stable and improved  Diet recommendation: Healthy heart  Filed Weights   03/26/18 0245 03/26/18 0906  Weight: 78.9 kg (174 lb) 80 kg (176 lb 5.9 oz)    History of present illness:  Pleasant 82 year old male with a history of A. fib on Xarelto presented with recurrent GI bleed.  Patient was transferred from any pain to [facility] due to a positive capsule study done by Dr. Melony Overly which showed active bleeding.  IR proceeded with arteriogram which was normal.  Patient has had no active bleeding in over 48 hours.  His hemoglobin is around 8.  The plan is for him to have follow-up with Dr. Melony Overly who is setting him up to see GI at [facility].  Patient instructed to return to emergency department if he has any further evidence of GI bleed which she is very well aware of.  Hospital Course:  1. Acute blood lossAnemia-l GI bleed, from -Capsule study does demonstrate active GI bleeding. 6units of PRBC have been ordered and transfused. Angiography was done 03/28/2018 by IR and was normal.  Patient has had no further bleeding for over 48 hours.    Patient will be discharged home and will continue to hold his anticoagulants and antiplatelet treatment.  This will need to be restarted in the near future.  Likely a week or  so. 2. Melena-patient complains of black stool actually, FOBT is positive in the ED. patient had EGD in April 2019 which showed bulbar duodenitis, erosive gastritis. Holdingaspirin at discharge for another week at least. 3. Acute kidney injury-peak creatinine is 1.74, now normalized.  Secondary to prerenal azotemia from volume losses.   4. Hypertension-blood pressure is stable, resume home meds now that his bleeding is stopped.  5.  Coagulopathy-secondary to Xarelto, INR elevated at 3.70, patient given 1 dose of vitamin K 10 mg IV, Kcentra.  continue to hold Xarelto in the setting of GI bleed at least another week. 6. Chronic atrial fibrillation-we will hold anticoagulation at this time due to above.  7. CAD-stable, hold aspirin.     Discharge Exam: Vitals:   03/30/18 0500 03/30/18 0826  BP:  (!) 135/111  Pulse:  67  Resp:  20  Temp: 97.9 F (36.6 C) 97.9 F (36.6 C)  SpO2:  100%    General: Alert and oriented x4 no acute distress cooperative and friendly Cardiovascular: Regular rate and rhythm without murmurs rubs or gallops Respiratory: Clear to auscultation bilaterally no wheezes crackles rales  Discharge Instructions   Discharge Instructions    Diet - low sodium heart healthy   Complete by:  As directed    Increase activity slowly   Complete by:  As directed      Allergies as of 03/30/2018      Reactions   Lipitor [atorvastatin] Other (See Comments)   SEVERE HEADACHE  Simvastatin Other (See Comments)   MUSCLE ACHES   Penicillins Rash   Has patient had a PCN reaction causing immediate rash, facial/tongue/throat swelling, SOB or lightheadedness with hypotension: Yes Has patient had a PCN reaction causing severe rash involving mucus membranes or skin necrosis: No Has patient had a PCN reaction that required hospitalization No Has patient had a PCN reaction occurring within the last 10 years: No If all of the above answers are "NO", then may proceed with  Cephalosporin use.   Sulfa Antibiotics Rash   Tolerates silver sulfadiazine cream at home      Medication List    STOP taking these medications   aspirin EC 81 MG tablet   XARELTO 20 MG Tabs tablet Generic drug:  rivaroxaban     TAKE these medications   amLODipine 10 MG tablet Commonly known as:  NORVASC Take 1 tablet (10 mg total) by mouth daily.   CENTRUM SILVER PO Take 1 tablet by mouth daily.   cetirizine 10 MG tablet Commonly known as:  ZYRTEC Take 10 mg by mouth daily.   DUREZOL 0.05 % Emul Generic drug:  Difluprednate Place 1 drop into both eyes 2 (two) times daily.   feeding supplement Liqd Take 1 Container by mouth 3 (three) times daily between meals. What changed:  when to take this   ferrous sulfate 325 (65 FE) MG tablet Take 65 mg by mouth daily with breakfast.   Fish Oil 1200 MG Caps Take 1,200 mg by mouth daily.   FLINTSTONES PLUS IRON PO Take 1 tablet by mouth daily.   losartan-hydrochlorothiazide 100-12.5 MG tablet Commonly known as:  HYZAAR Take 1 tablet by mouth daily.   lovastatin 10 MG tablet Commonly known as:  MEVACOR Take 10 mg by mouth daily.   nebivolol 10 MG tablet Commonly known as:  BYSTOLIC Take 10 mg by mouth daily.   nystatin cream Commonly known as:  MYCOSTATIN Apply 1 application topically 3 (three) times daily.   pantoprazole 40 MG tablet Commonly known as:  PROTONIX Take 1 tablet (40 mg total) by mouth daily.   PHILLIPS COLON HEALTH PO Take 1 capsule by mouth daily.   silver sulfADIAZINE 1 % cream Commonly known as:  SILVADENE Apply topically daily. What changed:    how much to take  additional instructions   vitamin B-12 1000 MCG tablet Commonly known as:  CYANOCOBALAMIN Take 2,000 mcg by mouth every morning.      Allergies  Allergen Reactions  . Lipitor [Atorvastatin] Other (See Comments)    SEVERE HEADACHE  . Simvastatin Other (See Comments)    MUSCLE ACHES  . Penicillins Rash    Has patient  had a PCN reaction causing immediate rash, facial/tongue/throat swelling, SOB or lightheadedness with hypotension: Yes Has patient had a PCN reaction causing severe rash involving mucus membranes or skin necrosis: No Has patient had a PCN reaction that required hospitalization No Has patient had a PCN reaction occurring within the last 10 years: No If all of the above answers are "NO", then may proceed with Cephalosporin use.   . Sulfa Antibiotics Rash    Tolerates silver sulfadiazine cream at home   Follow-up Information    Rehman, Mechele Dawley, MD Follow up in 1 week(s).   Specialty:  Gastroenterology Why:  please make appt today for next week Contact information: [address], [address]  [place] [address] [phone]        Lemmie Evens, MD Follow up in 1 week(s).  Specialty:  Family Medicine Contact information: [place] [address] [phone]            The results of significant diagnostics from this hospitalization (including imaging, microbiology, ancillary and laboratory) are listed below for reference.    Significant Diagnostic Studies: Nm Gi Blood Loss  Result Date: 03/26/2018 CLINICAL DATA:  GI bleed EXAM: NUCLEAR MEDICINE GASTROINTESTINAL BLEEDING SCAN TECHNIQUE: Sequential abdominal images were obtained following intravenous administration of Tc-56mlabeled red blood cells. RADIOPHARMACEUTICALS:  25.6 mCi Tc-950mertechnetate in-vitro labeled red cells. COMPARISON:  CT abdomen and pelvis 10/29/2016 FINDINGS: Beginning on the first image, a focus of abnormal intestinal localization of labeled red cells is identified in the LEFT mid abdomen. Over the course of the first hour imaging this transits curvilinear bowel loops in the LEFT mid abdomen. Findings are consistent with a small bowel origin of active gastrointestinal bleeding. IMPRESSION: Positive GI bleeding scan for presence of an active source of GI bleeding in the LEFT mid abdomen in  a small bowel loop likely jejunal. Findings called to Dr. ReLaural Goldenn 03/26/2018 at 1613 hours. Electronically Signed   By: MaLavonia Dana.D.   On: 03/26/2018 16:15   Ir Angiogram Visceral Selective  Result Date: 03/28/2018 INDICATION: 89ear old with GI bleeding. The suspected bleeding source is from the proximal jejunum. Patient continues to have decreasing hemoglobin level. EXAM: MESENTERIC ARTERIOGRAM WITH SELECTION OF THE CELIAC TRUNK AND SMA ULTRASOUND GUIDANCE FOR VASCULAR ACCESS MEDICATIONS: None ANESTHESIA/SEDATION: Moderate (conscious) sedation was employed during this procedure. A total of Versed 2.0 mg and Fentanyl 100 mcg was administered intravenously. Moderate Sedation Time: 43 minutes. The patient's level of consciousness and vital signs were monitored continuously by radiology nursing throughout the procedure under my direct supervision. CONTRAST:  75 mL Isovue-300 FLUOROSCOPY TIME:  Fluoroscopy Time: 8 minutes, 19765Gy COMPLICATIONS: None immediate. PROCEDURE: Informed consent was obtained from the patient following explanation of the procedure, risks, benefits and alternatives. The patient understands, agrees and consents for the procedure. All questions were addressed. A time out was performed prior to the initiation of the procedure. Maximal barrier sterile technique utilized including caps, mask, sterile gowns, sterile gloves, large sterile drape, hand hygiene, and Betadine prep. Ultrasound confirmed a patent left common femoral artery. Ultrasound image was taken and saved for documentation. Left groin was selected for access based on previous left leg amputation. Left groin was prepped and draped in sterile fashion. Skin was anesthetized with 1% lidocaine. 21 gauge needle directed into the left common femoral artery with ultrasound guidance and a micropuncture dilator set was placed. A 5 French vascular sheath was placed over a Bentson wire. A C2 catheter was used to select the celiac trunk  and the SMA. Celiac arteriography and SMA arteriography was performed. The C2 catheter was exchanged for a Sos catheter and attempted to cannulate the IMA but this was found to be technically difficult. IMA was thought to be an unlikely source of the bleeding based on the previous imaging and endoscopy, therefore, additional attempts to cannulate the IMA were not made. Final angiogram was performed through the left groin sheath. The left groin sheath was removed using an ExoSeal closure device. Hemostasis at the left groin at the end of the procedure. Fluoroscopic images were taken and saved for this procedure. FINDINGS: Celiac trunk is patent with typical anatomy. The left gastric artery, splenic artery and common hepatic artery are patent. No gross abnormality involving the GDA. No active bleeding or vascular malformations associated  with the celiac arterial distribution. Atherosclerotic disease involving the splenic artery. Splenic vein and portal venous system are patent. SMA and main branches are patent. No evidence for active bleeding or vascular malformation involving the SMA. SMV and portal venous system are patent. IMPRESSION: Negative mesenteric arteriogram. No evidence for active bleeding or vascular malformation involving the celiac or SMA arteries. Electronically Signed   By: Markus Daft M.D.   On: 03/28/2018 17:45   Ir Angiogram Visceral Selective  Result Date: 03/28/2018 INDICATION: 82 year old with GI bleeding. The suspected bleeding source is from the proximal jejunum. Patient continues to have decreasing hemoglobin level. EXAM: MESENTERIC ARTERIOGRAM WITH SELECTION OF THE CELIAC TRUNK AND SMA ULTRASOUND GUIDANCE FOR VASCULAR ACCESS MEDICATIONS: None ANESTHESIA/SEDATION: Moderate (conscious) sedation was employed during this procedure. A total of Versed 2.0 mg and Fentanyl 100 mcg was administered intravenously. Moderate Sedation Time: 43 minutes. The patient's level of consciousness and vital  signs were monitored continuously by radiology nursing throughout the procedure under my direct supervision. CONTRAST:  75 mL Isovue-300 FLUOROSCOPY TIME:  Fluoroscopy Time: 8 minutes, 993 mGy COMPLICATIONS: None immediate. PROCEDURE: Informed consent was obtained from the patient following explanation of the procedure, risks, benefits and alternatives. The patient understands, agrees and consents for the procedure. All questions were addressed. A time out was performed prior to the initiation of the procedure. Maximal barrier sterile technique utilized including caps, mask, sterile gowns, sterile gloves, large sterile drape, hand hygiene, and Betadine prep. Ultrasound confirmed a patent left common femoral artery. Ultrasound image was taken and saved for documentation. Left groin was selected for access based on previous left leg amputation. Left groin was prepped and draped in sterile fashion. Skin was anesthetized with 1% lidocaine. 21 gauge needle directed into the left common femoral artery with ultrasound guidance and a micropuncture dilator set was placed. A 5 French vascular sheath was placed over a Bentson wire. A C2 catheter was used to select the celiac trunk and the SMA. Celiac arteriography and SMA arteriography was performed. The C2 catheter was exchanged for a Sos catheter and attempted to cannulate the IMA but this was found to be technically difficult. IMA was thought to be an unlikely source of the bleeding based on the previous imaging and endoscopy, therefore, additional attempts to cannulate the IMA were not made. Final angiogram was performed through the left groin sheath. The left groin sheath was removed using an ExoSeal closure device. Hemostasis at the left groin at the end of the procedure. Fluoroscopic images were taken and saved for this procedure. FINDINGS: Celiac trunk is patent with typical anatomy. The left gastric artery, splenic artery and common hepatic artery are patent. No gross  abnormality involving the GDA. No active bleeding or vascular malformations associated with the celiac arterial distribution. Atherosclerotic disease involving the splenic artery. Splenic vein and portal venous system are patent. SMA and main branches are patent. No evidence for active bleeding or vascular malformation involving the SMA. SMV and portal venous system are patent. IMPRESSION: Negative mesenteric arteriogram. No evidence for active bleeding or vascular malformation involving the celiac or SMA arteries. Electronically Signed   By: Markus Daft M.D.   On: 03/28/2018 17:45   Ir US Guide Vasc Access Left  Result Date: 03/28/2018 INDICATION: 82 year old with GI bleeding. The suspected bleeding source is from the proximal jejunum. Patient continues to have decreasing hemoglobin level. EXAM: MESENTERIC ARTERIOGRAM WITH SELECTION OF THE CELIAC TRUNK AND SMA ULTRASOUND GUIDANCE FOR VASCULAR ACCESS MEDICATIONS: None ANESTHESIA/SEDATION: Moderate (conscious)  sedation was employed during this procedure. A total of Versed 2.0 mg and Fentanyl 100 mcg was administered intravenously. Moderate Sedation Time: 43 minutes. The patient's level of consciousness and vital signs were monitored continuously by radiology nursing throughout the procedure under my direct supervision. CONTRAST:  75 mL Isovue-300 FLUOROSCOPY TIME:  Fluoroscopy Time: 8 minutes, 295 mGy COMPLICATIONS: None immediate. PROCEDURE: Informed consent was obtained from the patient following explanation of the procedure, risks, benefits and alternatives. The patient understands, agrees and consents for the procedure. All questions were addressed. A time out was performed prior to the initiation of the procedure. Maximal barrier sterile technique utilized including caps, mask, sterile gowns, sterile gloves, large sterile drape, hand hygiene, and Betadine prep. Ultrasound confirmed a patent left common femoral artery. Ultrasound image was taken and saved for  documentation. Left groin was selected for access based on previous left leg amputation. Left groin was prepped and draped in sterile fashion. Skin was anesthetized with 1% lidocaine. 21 gauge needle directed into the left common femoral artery with ultrasound guidance and a micropuncture dilator set was placed. A 5 French vascular sheath was placed over a Bentson wire. A C2 catheter was used to select the celiac trunk and the SMA. Celiac arteriography and SMA arteriography was performed. The C2 catheter was exchanged for a Sos catheter and attempted to cannulate the IMA but this was found to be technically difficult. IMA was thought to be an unlikely source of the bleeding based on the previous imaging and endoscopy, therefore, additional attempts to cannulate the IMA were not made. Final angiogram was performed through the left groin sheath. The left groin sheath was removed using an ExoSeal closure device. Hemostasis at the left groin at the end of the procedure. Fluoroscopic images were taken and saved for this procedure. FINDINGS: Celiac trunk is patent with typical anatomy. The left gastric artery, splenic artery and common hepatic artery are patent. No gross abnormality involving the GDA. No active bleeding or vascular malformations associated with the celiac arterial distribution. Atherosclerotic disease involving the splenic artery. Splenic vein and portal venous system are patent. SMA and main branches are patent. No evidence for active bleeding or vascular malformation involving the SMA. SMV and portal venous system are patent. IMPRESSION: Negative mesenteric arteriogram. No evidence for active bleeding or vascular malformation involving the celiac or SMA arteries. Electronically Signed   By: Markus Daft M.D.   On: 03/28/2018 17:45    Microbiology: Recent Results (from the past 240 hour(s))  MRSA PCR Screening     Status: None   Collection Time: 03/26/18  8:55 AM  Result Value Ref Range Status    MRSA by PCR NEGATIVE NEGATIVE Final    Comment:        The GeneXpert MRSA Assay (FDA approved for NASAL specimens only), is one component of a comprehensive MRSA colonization surveillance program. It is not intended to diagnose MRSA infection nor to guide or monitor treatment for MRSA infections. Performed at [facility], [address] [place], [place] [address]      Labs: Basic Metabolic Panel: Recent Labs  Lab 03/26/18 0250 03/26/18 1110 03/27/18 0333 03/29/18 1218 03/30/18 0648  NA 136 141 141 141 136  K 4.7 5.3* 4.8 3.8 3.7  CL 110 115* 117* 115* 113*  CO2 13* 16* 18* 20* 17*  GLUCOSE 203* 134* 118* 153* 110*  BUN 65* 67* 53* 26* 18  CREATININE 1.74* 1.55* 1.31* 1.16 1.02  CALCIUM 7.6* 7.9* 7.5* 7.7* 7.5*  Liver Function Tests: Recent Labs  Lab 03/26/18 0250 03/26/18 1110 03/27/18 0333  AST 22 23 24   ALT 18 18 16*  ALKPHOS 100 100 81  BILITOT 0.5 1.9* 1.5*  PROT 5.6* 5.8* 4.8*  ALBUMIN 2.7* 2.8* 2.4*   No results for input(s): LIPASE, AMYLASE in the last 168 hours. No results for input(s): AMMONIA in the last 168 hours. CBC: Recent Labs  Lab 03/26/18 0250 03/26/18 1110  03/29/18 0055 03/29/18 1218 03/29/18 1831 03/30/18 0017 03/30/18 0648  WBC 14.5* 14.9*  --   --  6.8  --   --   --   HGB 4.2* 6.4*   < > 6.9* 8.8* 9.4* 8.2* 8.0*  HCT 13.8* 20.3*   < > 21.9* 27.8* 29.9* 25.6* 25.4*  MCV 95.8 91.0  --   --  88.8  --   --   --   PLT 342 316  --   --  180  --   --   --    < > = values in this interval not displayed.   Cardiac Enzymes: No results for input(s): CKTOTAL, CKMB, CKMBINDEX, TROPONINI in the last 168 hours. BNP: BNP (last 3 results) No results for input(s): BNP in the last 8760 hours.  ProBNP (last 3 results) No results for input(s): PROBNP in the last 8760 hours.  CBG: Recent Labs  Lab 03/27/18 2118  GLUCAP 142*       Signed:  DAVID,RACHAL A MD.  [facility] 03/30/2018, 10:11 AM

## 2018-03-31 LAB — TYPE AND SCREEN
ABO/RH(D): A POS
Antibody Screen: NEGATIVE
UNIT DIVISION: 0
UNIT DIVISION: 0
Unit division: 0
Unit division: 0

## 2018-03-31 LAB — BPAM RBC
BLOOD PRODUCT EXPIRATION DATE: 201906302359
BLOOD PRODUCT EXPIRATION DATE: 201906302359
BLOOD PRODUCT EXPIRATION DATE: 201906302359
Blood Product Expiration Date: 201906122359
ISSUE DATE / TIME: 201906070302
ISSUE DATE / TIME: 201906070627
UNIT TYPE AND RH: 6200
UNIT TYPE AND RH: 6200
UNIT TYPE AND RH: 6200
Unit Type and Rh: 6200

## 2018-04-01 ENCOUNTER — Other Ambulatory Visit (INDEPENDENT_AMBULATORY_CARE_PROVIDER_SITE_OTHER): Payer: Self-pay | Admitting: *Deleted

## 2018-04-01 ENCOUNTER — Telehealth: Payer: Self-pay | Admitting: Cardiovascular Disease

## 2018-04-01 DIAGNOSIS — K922 Gastrointestinal hemorrhage, unspecified: Secondary | ICD-10-CM

## 2018-04-01 DIAGNOSIS — K921 Melena: Secondary | ICD-10-CM

## 2018-04-01 NOTE — Telephone Encounter (Signed)
Called pt no answer. Unable to leave msg.

## 2018-04-01 NOTE — Telephone Encounter (Signed)
Patient has been in hospital. States he has bleed from somewhere and has been taken off of ASA and Xarelto. Please advise. / tg

## 2018-04-02 ENCOUNTER — Other Ambulatory Visit (HOSPITAL_COMMUNITY)
Admission: RE | Admit: 2018-04-02 | Discharge: 2018-04-02 | Disposition: A | Discharge status: Home / Self Care | Payer: Medicare Other | Source: Ambulatory Visit | Attending: Internal Medicine | Admitting: Internal Medicine

## 2018-04-02 DIAGNOSIS — K922 Gastrointestinal hemorrhage, unspecified: Secondary | ICD-10-CM | POA: Diagnosis not present

## 2018-04-02 LAB — HEMOGLOBIN AND HEMATOCRIT, BLOOD
HEMATOCRIT: 24.2 % — AB (ref 39.0–52.0)
HEMOGLOBIN: 7.6 g/dL — AB (ref 13.0–17.0)

## 2018-04-03 ENCOUNTER — Encounter (HOSPITAL_COMMUNITY)
Admission: RE | Admit: 2018-04-03 | Discharge: 2018-04-03 | Disposition: A | Discharge status: Home / Self Care | Payer: Medicare Other | Source: Ambulatory Visit | Attending: Internal Medicine | Admitting: Internal Medicine

## 2018-04-03 ENCOUNTER — Encounter (HOSPITAL_COMMUNITY): Payer: Self-pay

## 2018-04-03 DIAGNOSIS — K922 Gastrointestinal hemorrhage, unspecified: Secondary | ICD-10-CM | POA: Insufficient documentation

## 2018-04-03 DIAGNOSIS — K921 Melena: Secondary | ICD-10-CM | POA: Diagnosis present

## 2018-04-03 LAB — HEMOGLOBIN AND HEMATOCRIT, BLOOD
HCT: 29.2 % — ABNORMAL LOW (ref 39.0–52.0)
Hemoglobin: 9.3 g/dL — ABNORMAL LOW (ref 13.0–17.0)

## 2018-04-03 LAB — PREPARE RBC (CROSSMATCH)

## 2018-04-03 MED ORDER — SODIUM CHLORIDE 0.9% FLUSH
INTRAVENOUS | Status: AC
  Filled 2018-04-03: qty 20

## 2018-04-03 MED ORDER — DIPHENHYDRAMINE HCL 25 MG PO CAPS
25.0000 mg | ORAL_CAPSULE | Freq: Once | ORAL | Status: AC
  Administered 2018-04-03: 25 mg via ORAL

## 2018-04-03 MED ORDER — FUROSEMIDE 10 MG/ML IJ SOLN
20.0000 mg | Freq: Once | INTRAMUSCULAR | Status: AC
  Administered 2018-04-03: 20 mg via INTRAVENOUS
  Filled 2018-04-03: qty 4

## 2018-04-03 MED ORDER — ACETAMINOPHEN 325 MG PO TABS
650.0000 mg | ORAL_TABLET | Freq: Once | ORAL | Status: AC
  Administered 2018-04-03: 650 mg via ORAL

## 2018-04-03 MED ORDER — SODIUM CHLORIDE 0.9 % IV SOLN
Freq: Once | INTRAVENOUS | Status: AC
  Administered 2018-04-03: 10:00:00 via INTRAVENOUS

## 2018-04-04 LAB — TYPE AND SCREEN
ABO/RH(D): A POS
ANTIBODY SCREEN: NEGATIVE
UNIT DIVISION: 0
Unit division: 0

## 2018-04-04 LAB — BPAM RBC
BLOOD PRODUCT EXPIRATION DATE: 201907042359
Blood Product Expiration Date: 201907042359
ISSUE DATE / TIME: 201906121003
ISSUE DATE / TIME: 201906121140
UNIT TYPE AND RH: 6200
Unit Type and Rh: 6200

## 2018-04-04 NOTE — Telephone Encounter (Signed)
Spoke with pt, he is going to see the GI folks at [facility] and is going to ask about restarting. Explained to the patient they will need to let us know when it will be safe for him to restart. Patient voiced understanding

## 2018-04-08 ENCOUNTER — Encounter: Payer: Self-pay | Admitting: Cardiovascular Disease

## 2018-04-08 ENCOUNTER — Other Ambulatory Visit: Payer: Self-pay

## 2018-04-08 ENCOUNTER — Ambulatory Visit (INDEPENDENT_AMBULATORY_CARE_PROVIDER_SITE_OTHER): Payer: Medicare Other | Admitting: Cardiovascular Disease

## 2018-04-08 VITALS — BP 153/64 | HR 63 | Ht 68.5 in | Wt 170.0 lb

## 2018-04-08 DIAGNOSIS — E785 Hyperlipidemia, unspecified: Secondary | ICD-10-CM | POA: Diagnosis not present

## 2018-04-08 DIAGNOSIS — I10 Essential (primary) hypertension: Secondary | ICD-10-CM

## 2018-04-08 DIAGNOSIS — I482 Chronic atrial fibrillation: Secondary | ICD-10-CM

## 2018-04-08 DIAGNOSIS — I739 Peripheral vascular disease, unspecified: Secondary | ICD-10-CM

## 2018-04-08 DIAGNOSIS — I25708 Atherosclerosis of coronary artery bypass graft(s), unspecified, with other forms of angina pectoris: Secondary | ICD-10-CM | POA: Diagnosis not present

## 2018-04-08 DIAGNOSIS — K922 Gastrointestinal hemorrhage, unspecified: Secondary | ICD-10-CM

## 2018-04-08 DIAGNOSIS — I4821 Permanent atrial fibrillation: Secondary | ICD-10-CM

## 2018-04-08 DIAGNOSIS — D649 Anemia, unspecified: Secondary | ICD-10-CM

## 2018-04-08 MED ORDER — AMLODIPINE BESYLATE 10 MG PO TABS: 10.0000 mg | ORAL_TABLET | Freq: Every day | ORAL | 3 refills | Status: DC

## 2018-04-08 NOTE — Telephone Encounter (Signed)
Refilled norvasc per fax request

## 2018-04-08 NOTE — Patient Instructions (Signed)
Medication Instructions:  Your physician recommends that you continue on your current medications as directed. Please refer to the Current Medication list given to you today.  Labwork: NONE  Testing/Procedures: NONE  Follow-Up: Your physician recommends that you schedule a follow-up appointment in: 3 MONTHS WITH DR. Bronson Ing  Any Other Special Instructions Will Be Listed Below (If Applicable).  If you need a refill on your cardiac medications before your next appointment, please call your pharmacy.

## 2018-04-08 NOTE — Progress Notes (Signed)
SUBJECTIVE: The patient presents for routine follow-up.  He has had issues with GI bleeding and was found to have celiac disease.  I spoke with Dr. Laural Golden and he discussed the possibility of starting warfarin.  He was initially hospitalized for GI bleeding earlier this month.  Antiplatelet therapy and systemic anticoagulation were held.  He has a history of atrial fibrillation and coronary artery disease with a history of CABG.  He denies chest pain, palpitations, shortness of breath.  He checks his blood pressure daily at home and it is normal.  It was 135/69 earlier this morning.  Last hemoglobin in the system was 9.3 on 04/03/2018.  He believes the [facility] physicians told him to start anticoagulation on 04/09/2018.  Review of Systems: As per "subjective", otherwise negative.  Allergies  Allergen Reactions  . Lipitor [Atorvastatin] Other (See Comments)    SEVERE HEADACHE  . Simvastatin Other (See Comments)    MUSCLE ACHES  . Penicillins Rash    Has patient had a PCN reaction causing immediate rash, facial/tongue/throat swelling, SOB or lightheadedness with hypotension: Yes Has patient had a PCN reaction causing severe rash involving mucus membranes or skin necrosis: No Has patient had a PCN reaction that required hospitalization No Has patient had a PCN reaction occurring within the last 10 years: No If all of the above answers are "NO", then may proceed with Cephalosporin use.   . Sulfa Antibiotics Rash    Tolerates silver sulfadiazine cream at home    Current Outpatient Medications  Medication Sig Dispense Refill  . amLODipine (NORVASC) 10 MG tablet Take 1 tablet (10 mg total) by mouth daily. 90 tablet 3  . cetirizine (ZYRTEC) 10 MG tablet Take 10 mg by mouth daily.    . Difluprednate (DUREZOL) 0.05 % EMUL Place 1 drop into both eyes 2 (two) times daily.     . feeding supplement (BOOST / RESOURCE BREEZE) LIQD Take 1 Container by mouth 3 (three) times daily between meals.  (Patient taking differently: Take 1 Container by mouth 2 (two) times daily. ) 90 Container 4  . ferrous sulfate 325 (65 FE) MG tablet Take 65 mg by mouth daily with breakfast.    . losartan-hydrochlorothiazide (HYZAAR) 100-12.5 MG tablet Take 1 tablet by mouth daily.    Marland Kitchen lovastatin (MEVACOR) 10 MG tablet Take 10 mg by mouth daily.     . Multiple Vitamins-Minerals (CENTRUM SILVER PO) Take 1 tablet by mouth daily.      . nebivolol (BYSTOLIC) 10 MG tablet Take 10 mg by mouth daily.    Marland Kitchen nystatin cream (MYCOSTATIN) Apply 1 application topically 3 (three) times daily.    . Omega-3 Fatty Acids (FISH OIL) 1200 MG CAPS Take 1,200 mg by mouth daily.    . pantoprazole (PROTONIX) 40 MG tablet Take 1 tablet (40 mg total) by mouth daily. 30 tablet 0  . Pediatric Multivitamins-Iron (FLINTSTONES PLUS IRON PO) Take 1 tablet by mouth daily.    . Probiotic Product (PHILLIPS COLON HEALTH PO) Take 1 capsule by mouth daily.     . silver sulfADIAZINE (SILVADENE) 1 % cream Apply topically daily. (Patient taking differently: Apply 1 application topically daily. Applied to affected area of foot.) 50 g 0  . vitamin B-12 (CYANOCOBALAMIN) 1000 MCG tablet Take 2,000 mcg by mouth every morning.     No current facility-administered medications for this visit.     Past Medical History:  Diagnosis Date  . Arteriosclerotic cardiovascular disease (ASCVD)    CABG  in 1990s; 2002 total obstruction of LAD, CX and RCA with patent grafts and nl EF; Stress nuc. 2008 - mild LV dilation; normal EF; questionable small anteroapical scar; no ischemia  . Arthritis    "left knee" (11/08/2015)  . Atrial fibrillation ([place])   . Cellulitis 11/08/2015   "both feet"  . Chronic anticoagulation   . Dental crowns present   . Diabetic peripheral neuropathy (HCC)    bilateral lower leg, hands  . History of blood transfusion    "related to my leg surgery?"  . Hypertension    states under control with meds., has been on med. x "long time"  .  Iron deficiency anemia   . Jaw cancer ([place]) 1990s   "squamous cell"  . Myocardial infarction ([place]) 1990s   "after my jaw OR"  . Pedal edema    "not a lot", per pt.  . Peripheral vascular disease ([place])   . Presence of retained hardware 07/2015   failed hardware jaw  . Runny nose 07/27/2015   clear drainage, per pt.  . Type II diabetes mellitus ([place])    "diet controlled" (11/08/2015)    Past Surgical History:  Procedure Laterality Date  . AMPUTATION Left 11/11/2015   Procedure: LEFT ABOVE KNEE AMPUTATION;  Surgeon: Angelia Mould, MD;  Location: [place];  Service: Vascular;  Laterality: Left;  . CARDIAC CATHETERIZATION  1990s; 04/16/2001  . CATARACT EXTRACTION W/ INTRAOCULAR LENS  IMPLANT, BILATERAL Bilateral 09/2015  . COLONOSCOPY N/A 11/27/2014   Procedure: COLONOSCOPY;  Surgeon: Rogene Houston, MD;  [facility];  Service: Endoscopy;  Laterality: N/A;  830  . CORONARY ARTERY BYPASS GRAFT  1990's   "CABG X4"  . ESOPHAGOGASTRODUODENOSCOPY N/A 02/14/2018   Procedure: ESOPHAGOGASTRODUODENOSCOPY (EGD);  Surgeon: Rogene Houston, MD;  [facility];  Service: Endoscopy;  Laterality: N/A;  . ESOPHAGOGASTRODUODENOSCOPY (EGD) WITH PROPOFOL N/A 02/25/2018   Procedure: ESOPHAGOGASTRODUODENOSCOPY (EGD) WITH PROPOFOL;  Surgeon: Rogene Houston, MD;  [facility];  Service: Endoscopy;  Laterality: N/A;  . FRACTURE SURGERY    . GIVENS CAPSULE STUDY N/A 02/15/2018   Procedure: GIVENS CAPSULE STUDY;  Surgeon: Rogene Houston, MD;  [facility];  Service: Endoscopy;  Laterality: N/A;  . GIVENS CAPSULE STUDY N/A 03/27/2018   Procedure: GIVENS CAPSULE STUDY;  Surgeon: Rogene Houston, MD;  [facility];  Service: Endoscopy;  Laterality: N/A;  . I&D EXTREMITY Left 11/10/2015   Procedure: INCISION AND DRAINAGE LEFT FOOT, AMPUTATION OF LEFT THIRD TOE;  Surgeon: Conrad Stewartstown, MD;  Location: [place];  Service: Vascular;  Laterality: Left;  . INCISION AND  DRAINAGE ABSCESS Left 06/16/2005   wide exc. abscess 5th toe  . IR ANGIOGRAM VISCERAL SELECTIVE  03/28/2018  . IR ANGIOGRAM VISCERAL SELECTIVE  03/28/2018  . IR US GUIDE VASC ACCESS LEFT  03/28/2018  . MANDIBULAR HARDWARE REMOVAL Left 08/02/2015   Procedure:  HARDWARE REMOVAL TWO MANDIBULAR SCREWS;  Surgeon: Jannette Fogo, DDS;  Location: [place];  Service: Oral Surgery;  Laterality: Left;  . ORIF TIBIA FRACTURE Left ~ 1970  . PERIPHERAL VASCULAR CATHETERIZATION N/A 08/14/2016   Procedure: Abdominal Aortogram w/Lower Extremity;  Surgeon: Angelia Mould, MD;  Location: [name];  Service: Cardiovascular;  Laterality: N/A;  . SQUAMOUS CELL CARCINOMA EXCISION Left 1993   "took my jaw out; got cadavar in there now"  . TONSILLECTOMY      Social History   Socioeconomic History  .  Marital status: Married    Spouse name: Not on file  . Number of children: Not on file  . Years of education: Not on file  . Highest education level: Not on file  Occupational History  . Not on file  Social Needs  . Financial resource strain: Not on file  . Food insecurity:    Worry: Not on file    Inability: Not on file  . Transportation needs:    Medical: Not on file    Non-medical: Not on file  Tobacco Use  . Smoking status: Never Smoker  . Smokeless tobacco: Never Used  Substance and Sexual Activity  . Alcohol use: No    Alcohol/week: 0.0 oz  . Drug use: No  . Sexual activity: Yes  Lifestyle  . Physical activity:    Days per week: Not on file    Minutes per session: Not on file  . Stress: Not on file  Relationships  . Social connections:    Talks on phone: Not on file    Gets together: Not on file    Attends religious service: Not on file    Active member of club or organization: Not on file    Attends meetings of clubs or organizations: Not on file    Relationship status: Not on file  . Intimate partner violence:    Fear of current or ex partner: Not on file     Emotionally abused: Not on file    Physically abused: Not on file    Forced sexual activity: Not on file  Other Topics Concern  . Not on file  Social History Narrative  . Not on file     Vitals:   04/08/18 1158  BP: (!) 153/64  Pulse: 63  SpO2: 95%  Weight: 170 lb (77.1 kg)  Height: 5' 8.5" (1.74 m)    Wt Readings from Last 3 Encounters:  04/08/18 170 lb (77.1 kg)  03/26/18 176 lb 5.9 oz (80 kg)  02/22/18 173 lb (78.5 kg)     PHYSICAL EXAM General: NAD HEENT: Normal. Neck: No JVD, no thyromegaly. Lungs: Clear to auscultation bilaterally with normal respiratory effort. CV: Regular rate and irregular rhythm, normal S1/S2, no S3, no murmur. No right leg edema.  Left AKA. Abdomen: Soft, nontender, no distention.  Neurologic: Alert and oriented.  Psych: Normal affect. Skin: Normal. Musculoskeletal: Left AKA.    ECG: Most recent ECG reviewed.   Labs: Lab Results  Component Value Date/Time   K 3.7 03/30/2018 06:48 AM   BUN 18 03/30/2018 06:48 AM   CREATININE 1.02 03/30/2018 06:48 AM   CREATININE 1.17 03/07/2018 08:40 AM   ALT 16 (L) 03/27/2018 03:33 AM   TSH 4.746 (H) 11/11/2015 09:30 AM   HGB 9.3 (L) 04/03/2018 02:34 PM     Lipids: Lab Results  Component Value Date/Time   LDLCALC 64 08/22/2017 03:34 PM   CHOL 104 08/22/2017 03:34 PM   TRIG 58 08/22/2017 03:34 PM   HDL 28 (L) 08/22/2017 03:34 PM       ASSESSMENT AND PLAN:  1.  Coronary disease with history of CABG: Stable ischemic heart disease.  Continue Bystolic.  Statin intolerant.  I will obtain a copy of lipids from PCP.  If warfarin is initiated for atrial fibrillation, I will keep up him off of aspirin altogether.  2.  Atrial fibrillation: Rate controlled on Bystolic.  Systemic anticoagulation has been held.  I spoke with Dr. Laural Golden and he mentioned the possibility of starting  warfarin.  I will try to confirm with Dr. Laural Golden. The patient was told he could resume anticoagulation on 04/09/2018.  If  this is the case, I will have him enrolled in our anticoagulation clinic for warfarin initiation.  He does have some warfarin already, 2.5 mg tablets.  3.  Hypertension: Blood pressure is elevated.  He checks his blood pressure twice daily at home and it is routinely normal.  It was 135/69 earlier this morning.  4. Hyperlipidemia: Statin intolerant.  I will obtain a copy of lipids from PCP.  Possibly a candidate for PCSK-9 inhibitors.  5.  Peripheral vascular disease: Aspirin on hold.  Statin intolerant.  Followed by vascular surgery.  6.  Anemia with GI bleeding: Most recent hemoglobin was 9.3 on 04/03/2018.  If warfarin is indeed to be initiated, this will need very close monitoring.    Disposition: Follow up 3 months   Kate Sable, M.D., F.A.C.C.

## 2018-04-10 ENCOUNTER — Other Ambulatory Visit (INDEPENDENT_AMBULATORY_CARE_PROVIDER_SITE_OTHER): Payer: Self-pay | Admitting: *Deleted

## 2018-04-10 DIAGNOSIS — K921 Melena: Secondary | ICD-10-CM

## 2018-04-10 DIAGNOSIS — K922 Gastrointestinal hemorrhage, unspecified: Secondary | ICD-10-CM

## 2018-04-10 DIAGNOSIS — D508 Other iron deficiency anemias: Secondary | ICD-10-CM

## 2018-04-11 ENCOUNTER — Other Ambulatory Visit (INDEPENDENT_AMBULATORY_CARE_PROVIDER_SITE_OTHER): Payer: Self-pay | Admitting: *Deleted

## 2018-04-11 ENCOUNTER — Other Ambulatory Visit (HOSPITAL_COMMUNITY)
Admission: RE | Admit: 2018-04-11 | Discharge: 2018-04-11 | Disposition: A | Discharge status: Home / Self Care | Payer: Medicare Other | Source: Ambulatory Visit | Attending: Internal Medicine | Admitting: Internal Medicine

## 2018-04-11 ENCOUNTER — Ambulatory Visit (INDEPENDENT_AMBULATORY_CARE_PROVIDER_SITE_OTHER): Payer: Medicare Other | Admitting: *Deleted

## 2018-04-11 ENCOUNTER — Other Ambulatory Visit: Payer: Self-pay | Admitting: Cardiovascular Disease

## 2018-04-11 DIAGNOSIS — K921 Melena: Secondary | ICD-10-CM | POA: Diagnosis present

## 2018-04-11 DIAGNOSIS — D508 Other iron deficiency anemias: Secondary | ICD-10-CM | POA: Diagnosis present

## 2018-04-11 DIAGNOSIS — Z5181 Encounter for therapeutic drug level monitoring: Secondary | ICD-10-CM

## 2018-04-11 DIAGNOSIS — K920 Hematemesis: Secondary | ICD-10-CM | POA: Insufficient documentation

## 2018-04-11 DIAGNOSIS — I482 Chronic atrial fibrillation: Secondary | ICD-10-CM

## 2018-04-11 DIAGNOSIS — D649 Anemia, unspecified: Secondary | ICD-10-CM

## 2018-04-11 DIAGNOSIS — I4821 Permanent atrial fibrillation: Secondary | ICD-10-CM

## 2018-04-11 DIAGNOSIS — K900 Celiac disease: Secondary | ICD-10-CM

## 2018-04-11 LAB — TYPE AND SCREEN
ABO/RH(D): A POS
Antibody Screen: NEGATIVE

## 2018-04-11 LAB — HEMOGLOBIN AND HEMATOCRIT, BLOOD
HCT: 32.2 % — ABNORMAL LOW (ref 39.0–52.0)
Hemoglobin: 10 g/dL — ABNORMAL LOW (ref 13.0–17.0)

## 2018-04-11 MED ORDER — AMLODIPINE BESYLATE 10 MG PO TABS: 10.0000 mg | ORAL_TABLET | Freq: Every day | ORAL | 6 refills | Status: DC

## 2018-04-11 NOTE — Telephone Encounter (Signed)
Refill request for amLODipine (NORVASC) 10 MG tablet    [facility] [place] needs before Saturday.

## 2018-04-11 NOTE — Telephone Encounter (Signed)
Done

## 2018-04-15 ENCOUNTER — Other Ambulatory Visit (INDEPENDENT_AMBULATORY_CARE_PROVIDER_SITE_OTHER): Payer: Self-pay | Admitting: *Deleted

## 2018-04-15 ENCOUNTER — Encounter (INDEPENDENT_AMBULATORY_CARE_PROVIDER_SITE_OTHER): Payer: Self-pay | Admitting: *Deleted

## 2018-04-15 DIAGNOSIS — K900 Celiac disease: Secondary | ICD-10-CM

## 2018-04-15 DIAGNOSIS — K921 Melena: Secondary | ICD-10-CM

## 2018-04-15 DIAGNOSIS — D508 Other iron deficiency anemias: Secondary | ICD-10-CM

## 2018-04-15 DIAGNOSIS — D649 Anemia, unspecified: Secondary | ICD-10-CM

## 2018-04-22 ENCOUNTER — Ambulatory Visit (INDEPENDENT_AMBULATORY_CARE_PROVIDER_SITE_OTHER): Payer: Medicare Other | Admitting: *Deleted

## 2018-04-22 DIAGNOSIS — Z5181 Encounter for therapeutic drug level monitoring: Secondary | ICD-10-CM | POA: Diagnosis not present

## 2018-04-22 DIAGNOSIS — I482 Chronic atrial fibrillation: Secondary | ICD-10-CM | POA: Diagnosis not present

## 2018-04-22 DIAGNOSIS — I4821 Permanent atrial fibrillation: Secondary | ICD-10-CM

## 2018-04-22 LAB — POCT INR: INR: 4 — AB (ref 2.0–3.0)

## 2018-04-22 NOTE — Patient Instructions (Signed)
Pt started on coumadin 2.97m daily when he came home from the hospital. The last time I saw pt he had a 1 mg tablet.  Pt to call when gets home to confirm tablet strength. ( Pt called has been taking the 2.579mtablet but did find the 78m48mablet). Take coumadin 2.5mg92mnight then decrease dose to 1.25mg26mly except 2.5mg o26muesdays and Fridays. Is leaving for the beach today.  Recheck on Monday 04/22/18

## 2018-04-22 NOTE — Patient Instructions (Signed)
Hold coumadin tonight then start 38m daily  Recheck in 1 week. Pt to put away 2.564mtablet and use only the 30m62mablet.  Verbalized understanding.

## 2018-04-24 ENCOUNTER — Other Ambulatory Visit (INDEPENDENT_AMBULATORY_CARE_PROVIDER_SITE_OTHER): Payer: Self-pay | Admitting: *Deleted

## 2018-04-24 DIAGNOSIS — D508 Other iron deficiency anemias: Secondary | ICD-10-CM

## 2018-04-24 LAB — HEMOGLOBIN AND HEMATOCRIT, BLOOD
HCT: 31.5 % — ABNORMAL LOW (ref 38.5–50.0)
Hemoglobin: 10.5 g/dL — ABNORMAL LOW (ref 13.2–17.1)

## 2018-04-24 NOTE — Progress Notes (Signed)
cbc

## 2018-04-29 ENCOUNTER — Ambulatory Visit (INDEPENDENT_AMBULATORY_CARE_PROVIDER_SITE_OTHER): Payer: Medicare Other | Admitting: *Deleted

## 2018-04-29 DIAGNOSIS — Z5181 Encounter for therapeutic drug level monitoring: Secondary | ICD-10-CM | POA: Diagnosis not present

## 2018-04-29 DIAGNOSIS — I4821 Permanent atrial fibrillation: Secondary | ICD-10-CM

## 2018-04-29 DIAGNOSIS — I482 Chronic atrial fibrillation: Secondary | ICD-10-CM | POA: Diagnosis not present

## 2018-04-29 LAB — POCT INR: INR: 3.6 — AB (ref 2.0–3.0)

## 2018-04-29 NOTE — Patient Instructions (Signed)
Hold coumadin tonight then decrease dose to 2 tablets daily (59m) except 1 tablet (161m on Mondays, Wednesdays and Fridays  Recheck in 2 weeks.

## 2018-05-13 ENCOUNTER — Ambulatory Visit (INDEPENDENT_AMBULATORY_CARE_PROVIDER_SITE_OTHER): Payer: Medicare Other | Admitting: *Deleted

## 2018-05-13 DIAGNOSIS — Z5181 Encounter for therapeutic drug level monitoring: Secondary | ICD-10-CM | POA: Diagnosis not present

## 2018-05-13 DIAGNOSIS — I4891 Unspecified atrial fibrillation: Secondary | ICD-10-CM

## 2018-05-13 LAB — POCT INR: INR: 3 (ref 2.0–3.0)

## 2018-05-13 NOTE — Patient Instructions (Signed)
Continue coumadin 2 tablets daily (10m) except 1 tablet (159m on Mondays, Wednesdays and Fridays  Recheck in 3 weeks.

## 2018-05-15 ENCOUNTER — Other Ambulatory Visit: Payer: Self-pay | Admitting: *Deleted

## 2018-05-15 MED ORDER — WARFARIN SODIUM 1 MG PO TABS: ORAL_TABLET | ORAL | 3 refills | Status: DC

## 2018-05-27 ENCOUNTER — Other Ambulatory Visit: Payer: Self-pay | Admitting: Urology

## 2018-05-27 DIAGNOSIS — N200 Calculus of kidney: Secondary | ICD-10-CM

## 2018-06-03 ENCOUNTER — Ambulatory Visit (INDEPENDENT_AMBULATORY_CARE_PROVIDER_SITE_OTHER): Payer: Medicare Other | Admitting: *Deleted

## 2018-06-03 DIAGNOSIS — I4891 Unspecified atrial fibrillation: Secondary | ICD-10-CM

## 2018-06-03 DIAGNOSIS — Z5181 Encounter for therapeutic drug level monitoring: Secondary | ICD-10-CM | POA: Diagnosis not present

## 2018-06-03 LAB — POCT INR: INR: 2.5 (ref 2.0–3.0)

## 2018-06-03 NOTE — Patient Instructions (Signed)
Continue coumadin 2 tablets daily (74m) except 1 tablet (157m on Mondays, Wednesdays and Fridays  Recheck in 4 weeks.

## 2018-06-10 ENCOUNTER — Telehealth: Payer: Self-pay

## 2018-06-10 NOTE — Telephone Encounter (Signed)
, °

## 2018-06-13 ENCOUNTER — Ambulatory Visit (INDEPENDENT_AMBULATORY_CARE_PROVIDER_SITE_OTHER): Payer: Medicare Other | Admitting: Otolaryngology

## 2018-06-13 DIAGNOSIS — H903 Sensorineural hearing loss, bilateral: Secondary | ICD-10-CM

## 2018-06-17 ENCOUNTER — Emergency Department (HOSPITAL_COMMUNITY): Payer: Medicare Other

## 2018-06-17 ENCOUNTER — Other Ambulatory Visit: Payer: Self-pay

## 2018-06-17 ENCOUNTER — Encounter (HOSPITAL_COMMUNITY): Payer: Self-pay

## 2018-06-17 ENCOUNTER — Emergency Department (HOSPITAL_COMMUNITY)
Admission: EM | Admit: 2018-06-17 | Discharge: 2018-06-17 | Disposition: A | Discharge status: Home / Self Care | Payer: Medicare Other | Attending: Emergency Medicine | Admitting: Emergency Medicine

## 2018-06-17 DIAGNOSIS — M542 Cervicalgia: Secondary | ICD-10-CM | POA: Diagnosis present

## 2018-06-17 DIAGNOSIS — I10 Essential (primary) hypertension: Secondary | ICD-10-CM | POA: Insufficient documentation

## 2018-06-17 DIAGNOSIS — E119 Type 2 diabetes mellitus without complications: Secondary | ICD-10-CM | POA: Diagnosis not present

## 2018-06-17 DIAGNOSIS — Z79899 Other long term (current) drug therapy: Secondary | ICD-10-CM | POA: Diagnosis not present

## 2018-06-17 DIAGNOSIS — M436 Torticollis: Secondary | ICD-10-CM

## 2018-06-17 DIAGNOSIS — I2510 Atherosclerotic heart disease of native coronary artery without angina pectoris: Secondary | ICD-10-CM | POA: Diagnosis not present

## 2018-06-17 DIAGNOSIS — Z7901 Long term (current) use of anticoagulants: Secondary | ICD-10-CM | POA: Diagnosis not present

## 2018-06-17 LAB — PROTIME-INR
INR: 2.17
PROTHROMBIN TIME: 24 s — AB (ref 11.4–15.2)

## 2018-06-17 MED ORDER — MORPHINE SULFATE (PF) 4 MG/ML IV SOLN
4.0000 mg | Freq: Once | INTRAVENOUS | Status: AC
  Administered 2018-06-17: 4 mg via INTRAVENOUS
  Filled 2018-06-17: qty 1

## 2018-06-17 MED ORDER — METHOCARBAMOL 1000 MG/10ML IJ SOLN
1000.0000 mg | Freq: Once | INTRAVENOUS | Status: AC
  Administered 2018-06-17: 1000 mg via INTRAVENOUS
  Filled 2018-06-17: qty 10

## 2018-06-17 MED ORDER — METHOCARBAMOL 500 MG PO TABS: 500.0000 mg | ORAL_TABLET | Freq: Four times a day (QID) | ORAL | 0 refills | Status: DC | PRN

## 2018-06-17 MED ORDER — METHOCARBAMOL 1000 MG/10ML IJ SOLN
INTRAMUSCULAR | Status: AC
  Filled 2018-06-17: qty 10

## 2018-06-17 MED ORDER — TRAMADOL HCL 50 MG PO TABS: 50.0000 mg | ORAL_TABLET | Freq: Four times a day (QID) | ORAL | 0 refills | Status: DC | PRN

## 2018-06-17 NOTE — Discharge Instructions (Addendum)
Apply ice for 30 minutes at a time, 3-4 times a day.  Take acetaminophen as needed for pain relief, reserve tramadol for pain not relieved by acetaminophen.   Return if symptoms are getting worse.

## 2018-06-17 NOTE — ED Provider Notes (Signed)
[facility] EMERGENCY DEPARTMENT Provider Note   CSN: [id number] Arrival date & time: 06/17/18  0151     History   Chief Complaint Chief Complaint  Patient presents with  . Neck Pain    HPI [name] is a 82 y.o. male.  The history is provided by the patient.  Neck Pain    He has history of hypertension, diabetes, atrial fibrillation anticoagulated on warfarin, hyperlipidemia, peripheral vascular disease and he comes in complaining of neck pain for the last 3 days.  He woke up 3 days ago with pain on the right side of his neck which has been getting gradually worse and has moved to the left side of his neck.  Pain is severe and he rates it at 9/10.  Pain does not radiate and he denies any weakness or numbness or tingling other than his baseline numbness from diabetic neuropathy.  He has tried topical salicylates and heating pads without any benefit.  He denies fever, chills, sweats.  He has not noticed any swelling.  He denies any recent trauma or overuse injury.  Past Medical History:  Diagnosis Date  . Arteriosclerotic cardiovascular disease (ASCVD)    CABG in 1990s; 2002 total obstruction of LAD, CX and RCA with patent grafts and nl EF; Stress nuc. 2008 - mild LV dilation; normal EF; questionable small anteroapical scar; no ischemia  . Arthritis    "left knee" (11/08/2015)  . Atrial fibrillation ([place])   . Cellulitis 11/08/2015   "both feet"  . Chronic anticoagulation   . Dental crowns present   . Diabetic peripheral neuropathy (HCC)    bilateral lower leg, hands  . History of blood transfusion    "related to my leg surgery?"  . Hypertension    states under control with meds., has been on med. x "long time"  . Iron deficiency anemia   . Jaw cancer ([place]) 1990s   "squamous cell"  . Myocardial infarction ([place]) 1990s   "after my jaw OR"  . Pedal edema    "not a lot", per pt.  . Peripheral vascular disease ([place])   . Presence of retained hardware 07/2015   failed  hardware jaw  . Runny nose 07/27/2015   clear drainage, per pt.  . Type II diabetes mellitus ([place])    "diet controlled" (11/08/2015)    Patient Active Problem List   Diagnosis Date Noted  . Gastrointestinal hemorrhage   . AKI (acute kidney injury) ([place])   . Anemia 03/26/2018  . Melena 02/21/2018  . Upper GI bleed 02/13/2018  . Coagulopathy ([place]) 02/13/2018  . Acute renal failure superimposed on stage 3 chronic kidney disease ([place]) 02/13/2018  . Weight gain 11/30/2015  . Coronary artery disease involving coronary bypass graft of native heart without angina pectoris   . Tachycardia   . Leukocytosis   . Abnormality of gait   . Status post above knee amputation of left lower extremity ([place])   . Acute blood loss anemia   . Acute pulmonary edema (HCC)   . Acute congestive heart failure ([place])   . Acute respiratory failure with hypoxia ([place])   . Pressure ulcer 11/09/2015  . Necrotic toes ([place]) 11/08/2015  . Lower extremity cellulitis 11/08/2015  . Bradycardia 05/28/2015  . PVD (peripheral vascular disease) with claudication ([place]) 09/30/2014  . Encounter for therapeutic drug monitoring 11/26/2013  . Peripheral vascular disease ([place]) 08/06/2012  . Chronic anticoagulation 01/20/2011  . Diabetes mellitus ([place]) 03/04/2010  . Dyslipidemia 03/04/2010  . Essential  hypertension 03/04/2010  . Hx of CABG 03/04/2010  . Permanent atrial fibrillation ([place]) 04/08/2009    Past Surgical History:  Procedure Laterality Date  . AMPUTATION Left 11/11/2015   Procedure: LEFT ABOVE KNEE AMPUTATION;  Surgeon: Angelia Mould, MD;  Location: [place];  Service: Vascular;  Laterality: Left;  . CARDIAC CATHETERIZATION  1990s; 04/16/2001  . CATARACT EXTRACTION W/ INTRAOCULAR LENS  IMPLANT, BILATERAL Bilateral 09/2015  . COLONOSCOPY N/A 11/27/2014   Procedure: COLONOSCOPY;  Surgeon: Rogene Houston, MD;  [facility];  Service: Endoscopy;  Laterality: N/A;  830  . CORONARY ARTERY BYPASS GRAFT   1990's   "CABG X4"  . ESOPHAGOGASTRODUODENOSCOPY N/A 02/14/2018   Procedure: ESOPHAGOGASTRODUODENOSCOPY (EGD);  Surgeon: Rogene Houston, MD;  [facility];  Service: Endoscopy;  Laterality: N/A;  . ESOPHAGOGASTRODUODENOSCOPY (EGD) WITH PROPOFOL N/A 02/25/2018   Procedure: ESOPHAGOGASTRODUODENOSCOPY (EGD) WITH PROPOFOL;  Surgeon: Rogene Houston, MD;  [facility];  Service: Endoscopy;  Laterality: N/A;  . FRACTURE SURGERY    . GIVENS CAPSULE STUDY N/A 02/15/2018   Procedure: GIVENS CAPSULE STUDY;  Surgeon: Rogene Houston, MD;  [facility];  Service: Endoscopy;  Laterality: N/A;  . GIVENS CAPSULE STUDY N/A 03/27/2018   Procedure: GIVENS CAPSULE STUDY;  Surgeon: Rogene Houston, MD;  [facility];  Service: Endoscopy;  Laterality: N/A;  . I&D EXTREMITY Left 11/10/2015   Procedure: INCISION AND DRAINAGE LEFT FOOT, AMPUTATION OF LEFT THIRD TOE;  Surgeon: Conrad [place], MD;  Location: [place];  Service: Vascular;  Laterality: Left;  . INCISION AND DRAINAGE ABSCESS Left 06/16/2005   wide exc. abscess 5th toe  . IR ANGIOGRAM VISCERAL SELECTIVE  03/28/2018  . IR ANGIOGRAM VISCERAL SELECTIVE  03/28/2018  . IR US GUIDE VASC ACCESS LEFT  03/28/2018  . MANDIBULAR HARDWARE REMOVAL Left 08/02/2015   Procedure:  HARDWARE REMOVAL TWO MANDIBULAR SCREWS;  Surgeon: Jannette Fogo, DDS;  Location: [place];  Service: Oral Surgery;  Laterality: Left;  . ORIF TIBIA FRACTURE Left ~ 1970  . PERIPHERAL VASCULAR CATHETERIZATION N/A 08/14/2016   Procedure: Abdominal Aortogram w/Lower Extremity;  Surgeon: Angelia Mould, MD;  Location: [place] CV LAB;  Service: Cardiovascular;  Laterality: N/A;  . SQUAMOUS CELL CARCINOMA EXCISION Left 1993   "took my jaw out; got cadavar in there now"  . TONSILLECTOMY          Home Medications    Prior to Admission medications   Medication Sig Start Date End Date Taking? Authorizing Provider  amLODipine (NORVASC) 10  MG tablet Take 1 tablet (10 mg total) by mouth daily. 04/11/18   Herminio Commons, MD  cetirizine (ZYRTEC) 10 MG tablet Take 10 mg by mouth daily.    [provider]  Difluprednate (DUREZOL) 0.05 % EMUL Place 1 drop into both eyes 2 (two) times daily.     [provider]  feeding supplement (BOOST / RESOURCE BREEZE) LIQD Take 1 Container by mouth 3 (three) times daily between meals. Patient taking differently: Take 1 Container by mouth 2 (two) times daily.  11/16/15   Loleta Chance, MD  ferrous sulfate 325 (65 FE) MG tablet Take 65 mg by mouth daily with breakfast.    [provider]  losartan-hydrochlorothiazide (HYZAAR) 100-12.5 MG tablet Take 1 tablet by mouth daily.    [provider]  lovastatin (MEVACOR) 10 MG tablet Take 10 mg by mouth daily.     [provider]  Multiple Vitamins-Minerals (CENTRUM SILVER PO) Take 1 tablet by mouth daily.      [provider]  nebivolol (BYSTOLIC) 10 MG tablet Take 10 mg by mouth daily.    [provider]  nystatin cream (MYCOSTATIN) Apply 1 application topically 3 (three) times daily.    [provider]  Omega-3 Fatty Acids (FISH OIL) 1200 MG CAPS Take 1,200 mg by mouth daily.    [provider]  pantoprazole (PROTONIX) 40 MG tablet Take 1 tablet (40 mg total) by mouth daily. 02/15/18   Orson Eva, MD  Pediatric Multivitamins-Iron (FLINTSTONES PLUS IRON PO) Take 1 tablet by mouth daily.    [provider]  Probiotic Product ([place]) Take 1 capsule by mouth daily.     [provider]  silver sulfADIAZINE (SILVADENE) 1 % cream Apply topically daily. Patient taking differently: Apply 1 application topically daily. Applied to affected area of foot. 11/16/15   Loleta Chance, MD  vitamin B-12 (CYANOCOBALAMIN) 1000 MCG tablet Take 2,000 mcg by mouth every morning.    [provider]  warfarin (COUMADIN) 1 MG tablet Take 2 tablets daily except  1 tablet on Mondays, Wednesdays and Fridays or as directed 05/15/18   Herminio Commons, MD    Family History Family History  Problem Relation Age of Onset  . Heart disease Mother     Social History Social History   Tobacco Use  . Smoking status: Never Smoker  . Smokeless tobacco: Never Used  Substance Use Topics  . Alcohol use: No    Alcohol/week: 0.0 standard drinks  . Drug use: No     Allergies   Lipitor [atorvastatin]; Simvastatin; Penicillins; and Sulfa antibiotics   Review of Systems Review of Systems  Musculoskeletal: Positive for neck pain.  All other systems reviewed and are negative.    Physical Exam Updated Vital Signs BP (!) 145/65 (BP Location: Right Arm)   Pulse 66   Temp 98.1 F (36.7 C) (Oral)   Resp 16   Ht 5' 9"  (1.753 m)   Wt 79.8 kg   SpO2 99%   BMI 25.99 kg/m   Physical Exam  Nursing note and vitals reviewed.  82 year old male, resting comfortably and in no acute distress. Vital signs are significant for mild elevation of systolic blood pressure. Oxygen saturation is 99%, which is normal. Head is normocephalic and atraumatic. PERRLA, EOMI. Oropharynx is clear. Neck has significant spasm of the paracervical muscles.  Rotation to the right is restricted by pain.  There is no midline tenderness.  There is no swelling or erythema or warmth.  There is no adenopathy or JVD. Back is nontender and there is no CVA tenderness. Lungs are clear without rales, wheezes, or rhonchi. Chest is nontender. Heart has regular rate and rhythm without murmur. Abdomen is soft, flat, nontender without masses or hepatosplenomegaly and peristalsis is normoactive. Extremities: Left above-the-knee amputation. Skin is warm and dry without rash. Neurologic: Mental status is normal, cranial nerves are intact, there are no motor or sensory deficits.  ED Treatments / Results  Labs (all labs ordered are listed, but only abnormal results are displayed) Labs Reviewed   PROTIME-INR - Abnormal; Notable for the following components:      Result Value   Prothrombin Time 24.0 (*)    All other components within normal limits    Radiology Ct Cervical Spine Wo Contrast  Result Date: 06/17/2018 CLINICAL DATA:  [age]-year-old male with spontaneous neck pain. Patient is  on warfarin. EXAM: CT CERVICAL SPINE WITHOUT CONTRAST TECHNIQUE: Multidetector CT imaging of the cervical spine was performed without intravenous contrast. Multiplanar CT image reconstructions were also generated. COMPARISON:  Cervical spine radiograph dated 03/19/2014 FINDINGS: Alignment: No acute subluxation. Skull base and vertebrae: No acute fracture. Soft tissues and spinal canal: No prevertebral fluid or swelling. No visible canal hematoma. Disc levels:  Degenerative changes.  Anterior osteophyte at C5-C6. Upper chest: Negative. Other: Prominent portion of the posterior aspect of the left thyroid lobe versus an exophytic thyroid nodule. Ultrasound may provide better evaluation of the thyroid gland. IMPRESSION: 1. No acute cervical spine pathology. 2. Prominence of the posterior aspect of the left lobe of the thyroid gland versus an exophytic nodule. Ultrasound may provide better evaluation. Electronically Signed   By: Anner Crete M.D.   On: 06/17/2018 04:35    Procedures Procedures   Medications Ordered in ED Medications  methocarbamol (ROBAXIN) 1,000 mg in dextrose 5 % 50 mL IVPB (1,000 mg Intravenous New Bag/Given 06/17/18 0503)  morphine 4 MG/ML injection 4 mg (4 mg Intravenous Given 06/17/18 0356)     Initial Impression / Assessment and Plan / ED Course  I have reviewed the triage vital signs and the nursing notes.  Pertinent labs & imaging results that were available during my care of the patient were reviewed by me and considered in my medical decision making (see chart for details).  Neck pain and patient known to be anticoagulated on warfarin.  Old records reviewed, and most  recent INR was 2.5 on August 12.  Clinically, this appears to be torticollis.  However, given anticoagulated state, will check INR and will check CT of cervical spine.  In the meantime, he has been given intravenous methocarbamol and morphine.  Unfortunately, because of anticoagulation, cannot use NSAIDs.  6:00 AM He feels much better after above-noted treatment.  CT scan shows no acute process.  INR is in the lower end of the therapeutic range.  No signs of neurologic injury, this appears to be purely musculoskeletal.  He is discharged with instructions to apply ice, prescription given for methocarbamol and tramadol.  Follow-up with PCP.  Final Clinical Impressions(s) / ED Diagnoses   Final diagnoses:  Torticollis  Anticoagulated on warfarin    ED Discharge Orders         Ordered    methocarbamol (ROBAXIN) 500 MG tablet  Every 6 hours PRN     06/17/18 0558    traMADol (ULTRAM) 50 MG tablet  Every 6 hours PRN     06/17/18 0962           Delora Fuel, MD 83/66/29 [phone]

## 2018-06-17 NOTE — ED Triage Notes (Signed)
Pt reports neck pain that started on the right side upon waking 3 days ago. Gradually pain has increased and moved more the the left side/shoulder. Pt reports pain with movement. No other symptoms reported.

## 2018-07-01 ENCOUNTER — Ambulatory Visit (INDEPENDENT_AMBULATORY_CARE_PROVIDER_SITE_OTHER): Payer: Medicare Other | Admitting: *Deleted

## 2018-07-01 DIAGNOSIS — Z5181 Encounter for therapeutic drug level monitoring: Secondary | ICD-10-CM

## 2018-07-01 DIAGNOSIS — I4891 Unspecified atrial fibrillation: Secondary | ICD-10-CM

## 2018-07-01 LAB — POCT INR: INR: 1.9 — AB (ref 2.0–3.0)

## 2018-07-01 NOTE — Patient Instructions (Signed)
Take coumadin 2 tablets tonight then resume 2 tablets daily (52m) except 1 tablet (165m on Mondays, Wednesdays and Fridays  Recheck in 4 weeks.

## 2018-07-04 NOTE — Telephone Encounter (Signed)
Message too old----deleting

## 2018-07-09 ENCOUNTER — Other Ambulatory Visit (HOSPITAL_COMMUNITY)
Admission: RE | Admit: 2018-07-09 | Discharge: 2018-07-09 | Disposition: A | Discharge status: Home / Self Care | Payer: Medicare Other | Source: Ambulatory Visit | Attending: Cardiovascular Disease | Admitting: Cardiovascular Disease

## 2018-07-09 ENCOUNTER — Encounter: Payer: Self-pay | Admitting: Cardiovascular Disease

## 2018-07-09 ENCOUNTER — Ambulatory Visit (INDEPENDENT_AMBULATORY_CARE_PROVIDER_SITE_OTHER): Payer: Medicare Other | Admitting: Cardiovascular Disease

## 2018-07-09 VITALS — BP 166/68 | HR 80 | Ht 68.0 in | Wt 173.0 lb

## 2018-07-09 DIAGNOSIS — D649 Anemia, unspecified: Secondary | ICD-10-CM

## 2018-07-09 DIAGNOSIS — I25708 Atherosclerosis of coronary artery bypass graft(s), unspecified, with other forms of angina pectoris: Secondary | ICD-10-CM | POA: Diagnosis not present

## 2018-07-09 DIAGNOSIS — I10 Essential (primary) hypertension: Secondary | ICD-10-CM

## 2018-07-09 DIAGNOSIS — I482 Chronic atrial fibrillation: Secondary | ICD-10-CM | POA: Insufficient documentation

## 2018-07-09 DIAGNOSIS — Z5181 Encounter for therapeutic drug level monitoring: Secondary | ICD-10-CM | POA: Diagnosis not present

## 2018-07-09 DIAGNOSIS — K922 Gastrointestinal hemorrhage, unspecified: Secondary | ICD-10-CM

## 2018-07-09 DIAGNOSIS — E785 Hyperlipidemia, unspecified: Secondary | ICD-10-CM

## 2018-07-09 DIAGNOSIS — I739 Peripheral vascular disease, unspecified: Secondary | ICD-10-CM | POA: Diagnosis not present

## 2018-07-09 DIAGNOSIS — I4821 Permanent atrial fibrillation: Secondary | ICD-10-CM

## 2018-07-09 LAB — CBC
HCT: 30.6 % — ABNORMAL LOW (ref 39.0–52.0)
Hemoglobin: 9.8 g/dL — ABNORMAL LOW (ref 13.0–17.0)
MCH: 28.2 pg (ref 26.0–34.0)
MCHC: 32 g/dL (ref 30.0–36.0)
MCV: 88.2 fL (ref 78.0–100.0)
Platelets: 313 10*3/uL (ref 150–400)
RBC: 3.47 MIL/uL — ABNORMAL LOW (ref 4.22–5.81)
RDW: 18.3 % — AB (ref 11.5–15.5)
WBC: 7.3 10*3/uL (ref 4.0–10.5)

## 2018-07-09 LAB — PROTIME-INR
INR: 2.02
PROTHROMBIN TIME: 22.7 s — AB (ref 11.4–15.2)

## 2018-07-09 NOTE — Progress Notes (Signed)
SUBJECTIVE: The patient presents for follow-up of coronary artery disease and atrial fibrillation.  He has had issues with GI bleeding and consequent anemia. He is now on warfarin.  The patient denies any symptoms of chest pain, palpitations, shortness of breath, lightheadedness, dizziness, leg swelling, orthopnea, PND, and syncope.  He denies hematochezia and melena.  He rushed to get here this morning.  Blood pressure is 166/68.  It was 134/68 at his home earlier this morning.  His son, Rainn Bullinger, is the Psychologist, sport and exercise.  He plays piano and gospel music and Mark's son plays bass.  They have been playing all over [place] this past week and the patient has been watching them play.    Review of Systems: As per "subjective", otherwise negative.  Allergies  Allergen Reactions  . Lipitor [Atorvastatin] Other (See Comments)    SEVERE HEADACHE  . Simvastatin Other (See Comments)    MUSCLE ACHES  . Penicillins Rash    Has patient had a PCN reaction causing immediate rash, facial/tongue/throat swelling, SOB or lightheadedness with hypotension: Yes Has patient had a PCN reaction causing severe rash involving mucus membranes or skin necrosis: No Has patient had a PCN reaction that required hospitalization No Has patient had a PCN reaction occurring within the last 10 years: No If all of the above answers are "NO", then may proceed with Cephalosporin use.   . Sulfa Antibiotics Rash    Tolerates silver sulfadiazine cream at home    Current Outpatient Medications  Medication Sig Dispense Refill  . amLODipine (NORVASC) 10 MG tablet Take 1 tablet (10 mg total) by mouth daily. 30 tablet 6  . cetirizine (ZYRTEC) 10 MG tablet Take 10 mg by mouth daily.    . Difluprednate (DUREZOL) 0.05 % EMUL Place 1 drop into both eyes 2 (two) times daily.     . feeding supplement (BOOST / RESOURCE BREEZE) LIQD Take 1 Container by mouth 3 (three) times daily between meals. (Patient taking  differently: Take 1 Container by mouth 2 (two) times daily. ) 90 Container 4  . ferrous sulfate 325 (65 FE) MG tablet Take 65 mg by mouth daily with breakfast.    . losartan-hydrochlorothiazide (HYZAAR) 100-12.5 MG tablet Take 1 tablet by mouth daily.    Marland Kitchen lovastatin (MEVACOR) 10 MG tablet Take 10 mg by mouth daily.     . methocarbamol (ROBAXIN) 500 MG tablet Take 1-2 tablets (500-1,000 mg total) by mouth every 6 (six) hours as needed for muscle spasms. 60 tablet 0  . Multiple Vitamins-Minerals (CENTRUM SILVER PO) Take 1 tablet by mouth daily.      . nebivolol (BYSTOLIC) 10 MG tablet Take 10 mg by mouth daily.    Marland Kitchen nystatin cream (MYCOSTATIN) Apply 1 application topically 3 (three) times daily.    . Omega-3 Fatty Acids (FISH OIL) 1200 MG CAPS Take 1,200 mg by mouth daily.    . pantoprazole (PROTONIX) 40 MG tablet Take 1 tablet (40 mg total) by mouth daily. 30 tablet 0  . Pediatric Multivitamins-Iron (FLINTSTONES PLUS IRON PO) Take 1 tablet by mouth daily.    . Probiotic Product (PHILLIPS COLON HEALTH PO) Take 1 capsule by mouth daily.     . silver sulfADIAZINE (SILVADENE) 1 % cream Apply topically daily. (Patient taking differently: Apply 1 application topically daily. Applied to affected area of foot.) 50 g 0  . traMADol (ULTRAM) 50 MG tablet Take 1 tablet (50 mg total) by mouth every 6 (six) hours as  needed. 15 tablet 0  . vitamin B-12 (CYANOCOBALAMIN) 1000 MCG tablet Take 2,000 mcg by mouth every morning.    . warfarin (COUMADIN) 1 MG tablet Take 2 tablets daily except 1 tablet on Mondays, Wednesdays and Fridays or as directed 60 tablet 3   No current facility-administered medications for this visit.     Past Medical History:  Diagnosis Date  . Arteriosclerotic cardiovascular disease (ASCVD)    CABG in 1990s; 2002 total obstruction of LAD, CX and RCA with patent grafts and nl EF; Stress nuc. 2008 - mild LV dilation; normal EF; questionable small anteroapical scar; no ischemia  . Arthritis     "left knee" (11/08/2015)  . Atrial fibrillation ([place])   . Cellulitis 11/08/2015   "both feet"  . Chronic anticoagulation   . Dental crowns present   . Diabetic peripheral neuropathy (HCC)    bilateral lower leg, hands  . History of blood transfusion    "related to my leg surgery?"  . Hypertension    states under control with meds., has been on med. x "long time"  . Iron deficiency anemia   . Jaw cancer ([place]) 1990s   "squamous cell"  . Myocardial infarction ([place]) 1990s   "after my jaw OR"  . Pedal edema    "not a lot", per pt.  . Peripheral vascular disease ([place])   . Presence of retained hardware 07/2015   failed hardware jaw  . Runny nose 07/27/2015   clear drainage, per pt.  . Type II diabetes mellitus ([place])    "diet controlled" (11/08/2015)    Past Surgical History:  Procedure Laterality Date  . AMPUTATION Left 11/11/2015   Procedure: LEFT ABOVE KNEE AMPUTATION;  Surgeon: Angelia Mould, MD;  Location: [place];  Service: Vascular;  Laterality: Left;  . CARDIAC CATHETERIZATION  1990s; 04/16/2001  . CATARACT EXTRACTION W/ INTRAOCULAR LENS  IMPLANT, BILATERAL Bilateral 09/2015  . COLONOSCOPY N/A 11/27/2014   Procedure: COLONOSCOPY;  Surgeon: Rogene Houston, MD;  [facility];  Service: Endoscopy;  Laterality: N/A;  830  . CORONARY ARTERY BYPASS GRAFT  1990's   "CABG X4"  . ESOPHAGOGASTRODUODENOSCOPY N/A 02/14/2018   Procedure: ESOPHAGOGASTRODUODENOSCOPY (EGD);  Surgeon: Rogene Houston, MD;  [facility];  Service: Endoscopy;  Laterality: N/A;  . ESOPHAGOGASTRODUODENOSCOPY (EGD) WITH PROPOFOL N/A 02/25/2018   Procedure: ESOPHAGOGASTRODUODENOSCOPY (EGD) WITH PROPOFOL;  Surgeon: Rogene Houston, MD;  [facility];  Service: Endoscopy;  Laterality: N/A;  . FRACTURE SURGERY    . GIVENS CAPSULE STUDY N/A 02/15/2018   Procedure: GIVENS CAPSULE STUDY;  Surgeon: Rogene Houston, MD;  [facility];  Service: Endoscopy;  Laterality: N/A;    . GIVENS CAPSULE STUDY N/A 03/27/2018   Procedure: GIVENS CAPSULE STUDY;  Surgeon: Rogene Houston, MD;  [facility];  Service: Endoscopy;  Laterality: N/A;  . I&D EXTREMITY Left 11/10/2015   Procedure: INCISION AND DRAINAGE LEFT FOOT, AMPUTATION OF LEFT THIRD TOE;  Surgeon: Conrad Parkwood, MD;  Location: [facility];  Service: Vascular;  Laterality: Left;  . INCISION AND DRAINAGE ABSCESS Left 06/16/2005   wide exc. abscess 5th toe  . IR ANGIOGRAM VISCERAL SELECTIVE  03/28/2018  . IR ANGIOGRAM VISCERAL SELECTIVE  03/28/2018  . IR US GUIDE VASC ACCESS LEFT  03/28/2018  . MANDIBULAR HARDWARE REMOVAL Left 08/02/2015   Procedure:  HARDWARE REMOVAL TWO MANDIBULAR SCREWS;  Surgeon: Jannette Fogo, DDS;  Location: [place];  Service: Oral Surgery;  Laterality: Left;  . ORIF TIBIA FRACTURE Left ~ 1970  . PERIPHERAL VASCULAR CATHETERIZATION N/A 08/14/2016   Procedure: Abdominal Aortogram w/Lower Extremity;  Surgeon: Angelia Mould, MD;  Location: [place] [facility];  Service: Cardiovascular;  Laterality: N/A;  . SQUAMOUS CELL CARCINOMA EXCISION Left 1993   "took my jaw out; got cadavar in there now"  . TONSILLECTOMY      Social History   Socioeconomic History  . Marital status: Married    Spouse name: Not on file  . Number of children: Not on file  . Years of education: Not on file  . Highest education level: Not on file  Occupational History  . Not on file  Social Needs  . Financial resource strain: Not on file  . Food insecurity:    Worry: Not on file    Inability: Not on file  . Transportation needs:    Medical: Not on file    Non-medical: Not on file  Tobacco Use  . Smoking status: Never Smoker  . Smokeless tobacco: Never Used  Substance and Sexual Activity  . Alcohol use: No    Alcohol/week: 0.0 standard drinks  . Drug use: No  . Sexual activity: Yes  Lifestyle  . Physical activity:    Days per week: Not on file    Minutes per session: Not on file  .  Stress: Not on file  Relationships  . Social connections:    Talks on phone: Not on file    Gets together: Not on file    Attends religious service: Not on file    Active member of club or organization: Not on file    Attends meetings of clubs or organizations: Not on file    Relationship status: Not on file  . Intimate partner violence:    Fear of current or ex partner: Not on file    Emotionally abused: Not on file    Physically abused: Not on file    Forced sexual activity: Not on file  Other Topics Concern  . Not on file  Social History Narrative  . Not on file     Vitals:   07/09/18 0931  BP: (!) 166/68  Pulse: 80  SpO2: 95%  Weight: 173 lb (78.5 kg)  Height: 5' 8"  (1.727 m)    Wt Readings from Last 3 Encounters:  07/09/18 173 lb (78.5 kg)  06/17/18 176 lb (79.8 kg)  04/08/18 170 lb (77.1 kg)     PHYSICAL EXAM General: NAD HEENT: Normal. Neck: No JVD, no thyromegaly. Lungs: Clear to auscultation bilaterally with normal respiratory effort. CV: Regular rate and  irregular rhythm, normal S1/S2, no S3, no murmur. No right leg edema.  Left AKA. Abdomen: Soft, nontender, no distention.  Neurologic: Alert and oriented.  Psych: Normal affect. Skin: Normal. Musculoskeletal: Left AKA.    ECG: Reviewed above under Subjective   Labs: Lab Results  Component Value Date/Time   K 3.7 03/30/2018 06:48 AM   BUN 18 03/30/2018 06:48 AM   CREATININE 1.02 03/30/2018 06:48 AM   CREATININE 1.17 03/07/2018 08:40 AM   ALT 16 (L) 03/27/2018 03:33 AM   TSH 4.746 (H) 11/11/2015 09:30 AM   HGB 10.5 (L) 04/24/2018 08:38 AM     Lipids: Lab Results  Component Value Date/Time   LDLCALC 64 08/22/2017 03:34 PM   CHOL 104 08/22/2017 03:34 PM   TRIG 58 08/22/2017 03:34 PM   HDL 28 (L) 08/22/2017 03:34 PM  ASSESSMENT AND PLAN: 1.  Coronary disease with history of CABG: Stable ischemic heart disease.  Continue Bystolic.  Statin intolerant.  I will obtain a copy of  lipids from PCP.  As he is on warfarin for atrial fibrillation, I will keep up him off of aspirin altogether.  2.  Permanent atrial fibrillation: Rate controlled on Bystolic.  Systemic anticoagulation has been initiated on warfarin.  INR 1.9 on 07/01/2018.  He missed 2 doses of warfarin as per the patient.  I will repeat an INR.  3.  Hypertension: Blood pressure is elevated.  He checks his blood pressure twice daily at home and it is routinely normal.  It was normal earlier this morning at home.  No changes to therapy.  4. Hyperlipidemia: Statin intolerant.  I will obtain a copy of lipids from PCP.  Possibly a candidate for PCSK-9 inhibitors.  5.  Peripheral vascular disease: No aspirin as he is on warfarin.  Statin intolerant.  Followed by vascular surgery.  6.  Anemia with GI bleeding: Most recent hemoglobin was 9.3 on 04/03/2018.  I will repeat his CBC.   Disposition: Follow up 6 months   Kate Sable, M.D., F.A.C.C.

## 2018-07-09 NOTE — Patient Instructions (Addendum)
Your physician wants you to follow-up in: 6 months With Dr.Koneswaran You will receive a reminder letter in the mail two months in advance. If you don't receive a letter, please call our office to schedule the follow-up appointment.   Please get labs today: cbc, INR   Your physician recommends that you continue on your current medications as directed. Please refer to the Current Medication list given to you today.     If you need a refill on your cardiac medications before your next appointment, please call your pharmacy.          Thank you for choosing [facility] !

## 2018-07-22 ENCOUNTER — Other Ambulatory Visit: Payer: Self-pay

## 2018-07-22 DIAGNOSIS — I739 Peripheral vascular disease, unspecified: Secondary | ICD-10-CM

## 2018-07-24 ENCOUNTER — Ambulatory Visit: Payer: Medicare Other | Admitting: Urology

## 2018-07-29 ENCOUNTER — Ambulatory Visit (INDEPENDENT_AMBULATORY_CARE_PROVIDER_SITE_OTHER): Payer: Medicare Other | Admitting: *Deleted

## 2018-07-29 DIAGNOSIS — Z5181 Encounter for therapeutic drug level monitoring: Secondary | ICD-10-CM

## 2018-07-29 DIAGNOSIS — I4891 Unspecified atrial fibrillation: Secondary | ICD-10-CM | POA: Diagnosis not present

## 2018-07-29 LAB — POCT INR: INR: 3.5 — AB (ref 2.0–3.0)

## 2018-07-29 NOTE — Patient Instructions (Signed)
Hold coumadin tonight then resume 2 tablets daily (27m) except 1 tablet (165m on Mondays, Wednesdays and Fridays  Recheck in 4 weeks.

## 2018-07-31 ENCOUNTER — Ambulatory Visit (HOSPITAL_COMMUNITY)
Admission: RE | Admit: 2018-07-31 | Discharge: 2018-07-31 | Disposition: A | Discharge status: Home / Self Care | Payer: Medicare Other | Source: Ambulatory Visit | Attending: Vascular Surgery | Admitting: Vascular Surgery

## 2018-07-31 ENCOUNTER — Other Ambulatory Visit: Payer: Self-pay

## 2018-07-31 ENCOUNTER — Ambulatory Visit (INDEPENDENT_AMBULATORY_CARE_PROVIDER_SITE_OTHER): Payer: Medicare Other | Admitting: Vascular Surgery

## 2018-07-31 ENCOUNTER — Encounter: Payer: Self-pay | Admitting: Vascular Surgery

## 2018-07-31 VITALS — BP 135/59 | HR 51 | Temp 97.0°F | Resp 16 | Ht 68.0 in | Wt 173.0 lb

## 2018-07-31 DIAGNOSIS — I739 Peripheral vascular disease, unspecified: Secondary | ICD-10-CM

## 2018-07-31 DIAGNOSIS — I872 Venous insufficiency (chronic) (peripheral): Secondary | ICD-10-CM | POA: Diagnosis not present

## 2018-07-31 DIAGNOSIS — I25708 Atherosclerosis of coronary artery bypass graft(s), unspecified, with other forms of angina pectoris: Secondary | ICD-10-CM | POA: Diagnosis not present

## 2018-07-31 NOTE — Progress Notes (Signed)
Patient name: [name] MRN: [id number] DOB: 1936-03-22 Sex: male  REASON FOR VISIT:   Follow-up of peripheral vascular disease.  HPI:   [name] is a pleasant 82 y.o. male who comes in for a six-month follow-up visit.  He has had a previous above-the-knee amputation on the left side.  He does not use his prosthesis.  He has combined peripheral vascular disease and chronic venous insufficiency on the right.  He previously had a wound on his right foot that is now healed.  He comes in for a six-month follow-up visit.  He denies any pain in the right foot.  He denies rest pain or any new nonhealing ulcers.  He is not a smoker.  He is on a statin.  He is not on aspirin because he is on Coumadin.  Current Outpatient Medications  Medication Sig Dispense Refill  . amLODipine (NORVASC) 10 MG tablet Take 1 tablet (10 mg total) by mouth daily. 30 tablet 6  . cetirizine (ZYRTEC) 10 MG tablet Take 10 mg by mouth daily.    . feeding supplement (BOOST / RESOURCE BREEZE) LIQD Take 1 Container by mouth 3 (three) times daily between meals. (Patient taking differently: Take 1 Container by mouth 2 (two) times daily. ) 90 Container 4  . ferrous sulfate 325 (65 FE) MG tablet Take 65 mg by mouth daily with breakfast.    . losartan-hydrochlorothiazide (HYZAAR) 100-12.5 MG tablet Take 1 tablet by mouth daily.    Marland Kitchen lovastatin (MEVACOR) 10 MG tablet Take 10 mg by mouth daily.     . methocarbamol (ROBAXIN) 500 MG tablet Take 1-2 tablets (500-1,000 mg total) by mouth every 6 (six) hours as needed for muscle spasms. 60 tablet 0  . Multiple Vitamins-Minerals (CENTRUM SILVER PO) Take 1 tablet by mouth daily.      . nebivolol (BYSTOLIC) 10 MG tablet Take 10 mg by mouth daily.    Marland Kitchen nystatin cream (MYCOSTATIN) Apply 1 application topically 3 (three) times daily.    . Omega-3 Fatty Acids (FISH OIL) 1200 MG CAPS Take 1,200 mg by mouth daily.    . pantoprazole (PROTONIX) 40 MG tablet Take 1 tablet (40 mg total)  by mouth daily. 30 tablet 0  . Pediatric Multivitamins-Iron (FLINTSTONES PLUS IRON PO) Take 1 tablet by mouth daily.    . Probiotic Product (PHILLIPS COLON HEALTH PO) Take 1 capsule by mouth daily.     . silver sulfADIAZINE (SILVADENE) 1 % cream Apply topically daily. (Patient taking differently: Apply 1 application topically daily. Applied to affected area of foot.) 50 g 0  . traMADol (ULTRAM) 50 MG tablet Take 1 tablet (50 mg total) by mouth every 6 (six) hours as needed. 15 tablet 0  . vitamin B-12 (CYANOCOBALAMIN) 1000 MCG tablet Take 2,000 mcg by mouth every morning.    . warfarin (COUMADIN) 1 MG tablet Take 2 tablets daily except 1 tablet on Mondays, Wednesdays and Fridays or as directed 60 tablet 3   No current facility-administered medications for this visit.     REVIEW OF SYSTEMS:  [X]  denotes positive finding, [ ]  denotes negative finding Vascular    Leg swelling    Cardiac    Chest pain or chest pressure:    Shortness of breath upon exertion:    Short of breath when lying flat:    Irregular heart rhythm:    Constitutional    Fever or chills:     PHYSICAL EXAM:   Vitals:   07/31/18 1627  BP: (!) 135/59  Pulse: (!) 51  Resp: 16  Temp: (!) 97 F (36.1 C)  TempSrc: Oral  SpO2: 97%  Weight: 173 lb (78.5 kg)  Height: 5' 8"  (1.727 m)    GENERAL: The patient is a well-nourished male, in no acute distress. The vital signs are documented above. CARDIOVASCULAR: There is a regular rate and rhythm. PULMONARY: There is good air exchange bilaterally without wheezing or rales. VASCULAR: He does have a palpable right femoral pulse.  I cannot palpate pedal pulses.  He does have a monophasic Doppler signals in the right foot. He has some mild swelling in the right leg which is chronic with some venous stasis changes.  He has no open ulcers. EXTREMITIES: He has a left above-the-knee amputation.  DATA:   ARTERIAL DOPPLER STUDY: I have independently interpreted his arterial  Doppler study today.  He has monophasic Doppler signals in the dorsalis pedis and posterior tibial positions with an ABI of 93%.  This may be falsely elevated.  Toe pressure on the right is 67 mmHg.  MEDICAL ISSUES:   COMBINED PERIPHERAL VASCULAR DISEASE AND CHRONIC VENOUS INSUFFICIENCY: This patient does have evidence of chronic venous insufficiency and infrainguinal arterial occlusive disease of the right lower extremity.  Currently he is asymptomatic and has no open ulcers.  He is not a smoker.  I have encouraged him to elevate his legs some but to not elevated too high if he develops rest pain.  I will stretch his follow-up out to 9 months.  Ordered follow-up ABIs at that time.  He knows to call sooner if he has problems.  Deitra Mayo [facility] [phone]

## 2018-08-26 ENCOUNTER — Ambulatory Visit (INDEPENDENT_AMBULATORY_CARE_PROVIDER_SITE_OTHER): Payer: Medicare Other | Admitting: *Deleted

## 2018-08-26 DIAGNOSIS — I4891 Unspecified atrial fibrillation: Secondary | ICD-10-CM

## 2018-08-26 DIAGNOSIS — Z5181 Encounter for therapeutic drug level monitoring: Secondary | ICD-10-CM | POA: Diagnosis not present

## 2018-08-26 LAB — POCT INR: INR: 2.8 (ref 2.0–3.0)

## 2018-08-26 NOTE — Patient Instructions (Signed)
Continue coumadin 2 tablets daily (80m) except 1 tablet (166m on Mondays, Wednesdays and Fridays  Recheck in 4 weeks.

## 2018-09-04 ENCOUNTER — Ambulatory Visit (INDEPENDENT_AMBULATORY_CARE_PROVIDER_SITE_OTHER): Payer: Medicare Other | Admitting: Urology

## 2018-09-04 DIAGNOSIS — N200 Calculus of kidney: Secondary | ICD-10-CM | POA: Diagnosis not present

## 2018-09-04 DIAGNOSIS — N401 Enlarged prostate with lower urinary tract symptoms: Secondary | ICD-10-CM | POA: Diagnosis not present

## 2018-09-23 ENCOUNTER — Ambulatory Visit (INDEPENDENT_AMBULATORY_CARE_PROVIDER_SITE_OTHER): Payer: Medicare Other | Admitting: *Deleted

## 2018-09-23 DIAGNOSIS — Z5181 Encounter for therapeutic drug level monitoring: Secondary | ICD-10-CM

## 2018-09-23 DIAGNOSIS — I4891 Unspecified atrial fibrillation: Secondary | ICD-10-CM | POA: Diagnosis not present

## 2018-09-23 LAB — POCT INR: INR: 3.1 — AB (ref 2.0–3.0)

## 2018-09-23 NOTE — Patient Instructions (Signed)
Continue coumadin 2 tablets daily (62m) except 1 tablet (169m on Mondays, Wednesdays and Fridays  Eat extra greens/salad today Recheck in 4 weeks.

## 2018-09-23 NOTE — Addendum Note (Signed)
Addended by: Malen Gauze on: 09/23/2018 10:52 AM   Modules accepted: Level of Service

## 2018-10-21 ENCOUNTER — Ambulatory Visit (INDEPENDENT_AMBULATORY_CARE_PROVIDER_SITE_OTHER): Payer: Medicare Other | Admitting: *Deleted

## 2018-10-21 DIAGNOSIS — I4891 Unspecified atrial fibrillation: Secondary | ICD-10-CM

## 2018-10-21 DIAGNOSIS — Z5181 Encounter for therapeutic drug level monitoring: Secondary | ICD-10-CM

## 2018-10-21 LAB — POCT INR: INR: 1.7 — AB (ref 2.0–3.0)

## 2018-10-21 MED ORDER — WARFARIN SODIUM 1 MG PO TABS: ORAL_TABLET | ORAL | 3 refills | Status: DC

## 2018-10-21 NOTE — Patient Instructions (Signed)
Take coumadin 2 tablets tonight then resume 2 tablets daily (31m) except 1 tablet (152m on Mondays, Wednesdays and Fridays  Continue greens/salads  Recheck in 3 weeks.

## 2018-10-21 NOTE — Addendum Note (Signed)
Addended by: Malen Gauze on: 10/21/2018 09:56 AM   Modules accepted: Orders

## 2018-10-30 ENCOUNTER — Ambulatory Visit (INDEPENDENT_AMBULATORY_CARE_PROVIDER_SITE_OTHER): Payer: Medicare Other | Admitting: Urology

## 2018-10-30 DIAGNOSIS — N401 Enlarged prostate with lower urinary tract symptoms: Secondary | ICD-10-CM

## 2018-10-30 DIAGNOSIS — R3915 Urgency of urination: Secondary | ICD-10-CM | POA: Diagnosis not present

## 2018-11-11 ENCOUNTER — Ambulatory Visit (INDEPENDENT_AMBULATORY_CARE_PROVIDER_SITE_OTHER): Payer: Medicare Other | Admitting: Pharmacist

## 2018-11-11 DIAGNOSIS — I4891 Unspecified atrial fibrillation: Secondary | ICD-10-CM | POA: Diagnosis not present

## 2018-11-11 DIAGNOSIS — Z5181 Encounter for therapeutic drug level monitoring: Secondary | ICD-10-CM | POA: Diagnosis not present

## 2018-11-11 DIAGNOSIS — I4821 Permanent atrial fibrillation: Secondary | ICD-10-CM | POA: Diagnosis not present

## 2018-11-11 LAB — POCT INR: INR: 1.9 — AB (ref 2.0–3.0)

## 2018-11-11 NOTE — Patient Instructions (Signed)
Description   Take coumadin 2 tablets tonight then Change dose to 2 tablets daily (53m) except 1 tablet (148m on Mondays and Fridays  Continue greens/salads  Recheck in 2 weeks.

## 2018-11-25 ENCOUNTER — Ambulatory Visit (INDEPENDENT_AMBULATORY_CARE_PROVIDER_SITE_OTHER): Payer: Medicare Other | Admitting: Pharmacist

## 2018-11-25 DIAGNOSIS — I4821 Permanent atrial fibrillation: Secondary | ICD-10-CM | POA: Diagnosis not present

## 2018-11-25 DIAGNOSIS — Z5181 Encounter for therapeutic drug level monitoring: Secondary | ICD-10-CM | POA: Diagnosis not present

## 2018-11-25 DIAGNOSIS — I4891 Unspecified atrial fibrillation: Secondary | ICD-10-CM | POA: Diagnosis not present

## 2018-11-25 LAB — POCT INR: INR: 2.2 (ref 2.0–3.0)

## 2018-11-25 NOTE — Patient Instructions (Signed)
Description   Continue 2 tablets daily (31m) except 1 tablet (11m on Mondays and Fridays  Continue greens/salads  Recheck in 3 weeks.

## 2018-12-16 ENCOUNTER — Ambulatory Visit (INDEPENDENT_AMBULATORY_CARE_PROVIDER_SITE_OTHER): Payer: Medicare Other | Admitting: *Deleted

## 2018-12-16 DIAGNOSIS — Z5181 Encounter for therapeutic drug level monitoring: Secondary | ICD-10-CM | POA: Diagnosis not present

## 2018-12-16 DIAGNOSIS — I4821 Permanent atrial fibrillation: Secondary | ICD-10-CM

## 2018-12-16 DIAGNOSIS — I4891 Unspecified atrial fibrillation: Secondary | ICD-10-CM

## 2018-12-16 LAB — POCT INR: INR: 1.8 — AB (ref 2.0–3.0)

## 2018-12-16 NOTE — Patient Instructions (Signed)
Increase coumadin to Continue 2 tablets daily (65m) except 1 tablet (122m on Fridays  Continue greens/salads  Recheck in 3 weeks.

## 2019-01-04 ENCOUNTER — Encounter (HOSPITAL_COMMUNITY): Payer: Self-pay

## 2019-01-04 ENCOUNTER — Emergency Department (HOSPITAL_COMMUNITY): Payer: Medicare Other

## 2019-01-04 ENCOUNTER — Inpatient Hospital Stay (HOSPITAL_COMMUNITY): Payer: Medicare Other

## 2019-01-04 ENCOUNTER — Inpatient Hospital Stay (HOSPITAL_COMMUNITY)
Admission: EM | Admit: 2019-01-04 | Discharge: 2019-01-08 | DRG: 023 | Disposition: A | Discharge status: Home / Self Care | Payer: Medicare Other | Attending: Neurology | Admitting: Neurology

## 2019-01-04 ENCOUNTER — Encounter (HOSPITAL_COMMUNITY)
Admission: EM | Disposition: A | Discharge status: Home / Self Care | Payer: Self-pay | Source: Home / Self Care | Attending: Neurology

## 2019-01-04 ENCOUNTER — Emergency Department (HOSPITAL_COMMUNITY): Payer: Medicare Other | Admitting: Critical Care Medicine

## 2019-01-04 ENCOUNTER — Other Ambulatory Visit: Payer: Self-pay

## 2019-01-04 DIAGNOSIS — E1122 Type 2 diabetes mellitus with diabetic chronic kidney disease: Secondary | ICD-10-CM | POA: Diagnosis present

## 2019-01-04 DIAGNOSIS — E872 Acidosis: Secondary | ICD-10-CM | POA: Diagnosis not present

## 2019-01-04 DIAGNOSIS — R791 Abnormal coagulation profile: Secondary | ICD-10-CM | POA: Diagnosis present

## 2019-01-04 DIAGNOSIS — J9600 Acute respiratory failure, unspecified whether with hypoxia or hypercapnia: Secondary | ICD-10-CM | POA: Diagnosis not present

## 2019-01-04 DIAGNOSIS — I1310 Hypertensive heart and chronic kidney disease without heart failure, with stage 1 through stage 4 chronic kidney disease, or unspecified chronic kidney disease: Secondary | ICD-10-CM | POA: Diagnosis present

## 2019-01-04 DIAGNOSIS — N179 Acute kidney failure, unspecified: Secondary | ICD-10-CM | POA: Diagnosis not present

## 2019-01-04 DIAGNOSIS — G8191 Hemiplegia, unspecified affecting right dominant side: Secondary | ICD-10-CM | POA: Diagnosis present

## 2019-01-04 DIAGNOSIS — R4781 Slurred speech: Secondary | ICD-10-CM | POA: Diagnosis present

## 2019-01-04 DIAGNOSIS — R29810 Facial weakness: Secondary | ICD-10-CM | POA: Diagnosis present

## 2019-01-04 DIAGNOSIS — D62 Acute posthemorrhagic anemia: Secondary | ICD-10-CM | POA: Diagnosis not present

## 2019-01-04 DIAGNOSIS — Z79899 Other long term (current) drug therapy: Secondary | ICD-10-CM

## 2019-01-04 DIAGNOSIS — I340 Nonrheumatic mitral (valve) insufficiency: Secondary | ICD-10-CM | POA: Diagnosis not present

## 2019-01-04 DIAGNOSIS — E785 Hyperlipidemia, unspecified: Secondary | ICD-10-CM | POA: Diagnosis present

## 2019-01-04 DIAGNOSIS — R4182 Altered mental status, unspecified: Secondary | ICD-10-CM | POA: Diagnosis present

## 2019-01-04 DIAGNOSIS — E1151 Type 2 diabetes mellitus with diabetic peripheral angiopathy without gangrene: Secondary | ICD-10-CM | POA: Diagnosis present

## 2019-01-04 DIAGNOSIS — I10 Essential (primary) hypertension: Secondary | ICD-10-CM | POA: Diagnosis present

## 2019-01-04 DIAGNOSIS — R4701 Aphasia: Secondary | ICD-10-CM | POA: Diagnosis present

## 2019-01-04 DIAGNOSIS — N183 Chronic kidney disease, stage 3 (moderate): Secondary | ICD-10-CM | POA: Diagnosis present

## 2019-01-04 DIAGNOSIS — I63412 Cerebral infarction due to embolism of left middle cerebral artery: Secondary | ICD-10-CM | POA: Diagnosis present

## 2019-01-04 DIAGNOSIS — D509 Iron deficiency anemia, unspecified: Secondary | ICD-10-CM | POA: Diagnosis present

## 2019-01-04 DIAGNOSIS — Z4659 Encounter for fitting and adjustment of other gastrointestinal appliance and device: Secondary | ICD-10-CM

## 2019-01-04 DIAGNOSIS — I25810 Atherosclerosis of coronary artery bypass graft(s) without angina pectoris: Secondary | ICD-10-CM | POA: Diagnosis present

## 2019-01-04 DIAGNOSIS — N390 Urinary tract infection, site not specified: Secondary | ICD-10-CM | POA: Diagnosis present

## 2019-01-04 DIAGNOSIS — Z882 Allergy status to sulfonamides status: Secondary | ICD-10-CM

## 2019-01-04 DIAGNOSIS — G9340 Encephalopathy, unspecified: Secondary | ICD-10-CM | POA: Diagnosis not present

## 2019-01-04 DIAGNOSIS — E119 Type 2 diabetes mellitus without complications: Secondary | ICD-10-CM

## 2019-01-04 DIAGNOSIS — I4821 Permanent atrial fibrillation: Secondary | ICD-10-CM | POA: Diagnosis present

## 2019-01-04 DIAGNOSIS — Z880 Allergy status to penicillin: Secondary | ICD-10-CM

## 2019-01-04 DIAGNOSIS — Z89612 Acquired absence of left leg above knee: Secondary | ICD-10-CM | POA: Diagnosis not present

## 2019-01-04 DIAGNOSIS — Z978 Presence of other specified devices: Secondary | ICD-10-CM

## 2019-01-04 DIAGNOSIS — E1142 Type 2 diabetes mellitus with diabetic polyneuropathy: Secondary | ICD-10-CM | POA: Diagnosis present

## 2019-01-04 DIAGNOSIS — Z7901 Long term (current) use of anticoagulants: Secondary | ICD-10-CM

## 2019-01-04 DIAGNOSIS — D72829 Elevated white blood cell count, unspecified: Secondary | ICD-10-CM | POA: Diagnosis present

## 2019-01-04 DIAGNOSIS — B9620 Unspecified Escherichia coli [E. coli] as the cause of diseases classified elsewhere: Secondary | ICD-10-CM | POA: Diagnosis present

## 2019-01-04 DIAGNOSIS — I252 Old myocardial infarction: Secondary | ICD-10-CM

## 2019-01-04 DIAGNOSIS — I6602 Occlusion and stenosis of left middle cerebral artery: Secondary | ICD-10-CM | POA: Diagnosis not present

## 2019-01-04 DIAGNOSIS — I639 Cerebral infarction, unspecified: Secondary | ICD-10-CM | POA: Diagnosis present

## 2019-01-04 DIAGNOSIS — R7303 Prediabetes: Secondary | ICD-10-CM

## 2019-01-04 DIAGNOSIS — J9601 Acute respiratory failure with hypoxia: Secondary | ICD-10-CM | POA: Diagnosis not present

## 2019-01-04 DIAGNOSIS — I739 Peripheral vascular disease, unspecified: Secondary | ICD-10-CM | POA: Diagnosis present

## 2019-01-04 DIAGNOSIS — R29710 NIHSS score 10: Secondary | ICD-10-CM | POA: Diagnosis present

## 2019-01-04 DIAGNOSIS — I351 Nonrheumatic aortic (valve) insufficiency: Secondary | ICD-10-CM | POA: Diagnosis not present

## 2019-01-04 DIAGNOSIS — Z888 Allergy status to other drugs, medicaments and biological substances status: Secondary | ICD-10-CM

## 2019-01-04 DIAGNOSIS — R29700 NIHSS score 0: Secondary | ICD-10-CM | POA: Diagnosis not present

## 2019-01-04 DIAGNOSIS — I2510 Atherosclerotic heart disease of native coronary artery without angina pectoris: Secondary | ICD-10-CM | POA: Diagnosis present

## 2019-01-04 DIAGNOSIS — I361 Nonrheumatic tricuspid (valve) insufficiency: Secondary | ICD-10-CM | POA: Diagnosis not present

## 2019-01-04 DIAGNOSIS — Z79891 Long term (current) use of opiate analgesic: Secondary | ICD-10-CM

## 2019-01-04 DIAGNOSIS — Z951 Presence of aortocoronary bypass graft: Secondary | ICD-10-CM

## 2019-01-04 DIAGNOSIS — Z8249 Family history of ischemic heart disease and other diseases of the circulatory system: Secondary | ICD-10-CM

## 2019-01-04 HISTORY — PX: IR PERCUTANEOUS ART THROMBECTOMY/INFUSION INTRACRANIAL INC DIAG ANGIO: IMG6087

## 2019-01-04 HISTORY — PX: IR CT HEAD LTD: IMG2386

## 2019-01-04 HISTORY — PX: RADIOLOGY WITH ANESTHESIA: SHX6223

## 2019-01-04 LAB — URINALYSIS, ROUTINE W REFLEX MICROSCOPIC
Bacteria, UA: NONE SEEN
Bilirubin Urine: NEGATIVE
Glucose, UA: NEGATIVE mg/dL
Hgb urine dipstick: NEGATIVE
Ketones, ur: NEGATIVE mg/dL
NITRITE: POSITIVE — AB
Protein, ur: NEGATIVE mg/dL
Specific Gravity, Urine: 1.043 — ABNORMAL HIGH (ref 1.005–1.030)
WBC, UA: >50 WBC/hpf — ABNORMAL HIGH (ref 0–5)
pH: 5 (ref 5.0–8.0)

## 2019-01-04 LAB — COMPREHENSIVE METABOLIC PANEL
ALT: 18 U/L (ref 0–44)
AST: 20 U/L (ref 15–41)
Albumin: 3.5 g/dL (ref 3.5–5.0)
Alkaline Phosphatase: 110 U/L (ref 38–126)
Anion gap: 10 (ref 5–15)
BUN: 26 mg/dL — ABNORMAL HIGH (ref 8–23)
CO2: 19 mmol/L — ABNORMAL LOW (ref 22–32)
Calcium: 8.6 mg/dL — ABNORMAL LOW (ref 8.9–10.3)
Chloride: 109 mmol/L (ref 98–111)
Creatinine, Ser: 1.25 mg/dL — ABNORMAL HIGH (ref 0.61–1.24)
GFR calc Af Amer: >60 mL/min (ref >60)
GFR calc non Af Amer: 54 mL/min — ABNORMAL LOW (ref >60)
Glucose, Bld: 162 mg/dL — ABNORMAL HIGH (ref 70–99)
Potassium: 3.9 mmol/L (ref 3.5–5.1)
Sodium: 138 mmol/L (ref 135–145)
TOTAL PROTEIN: 7.8 g/dL (ref 6.5–8.1)
Total Bilirubin: 0.5 mg/dL (ref 0.3–1.2)

## 2019-01-04 LAB — CBC
HCT: 36.5 % — ABNORMAL LOW (ref 39.0–52.0)
Hemoglobin: 11.9 g/dL — ABNORMAL LOW (ref 13.0–17.0)
MCH: 30.7 pg (ref 26.0–34.0)
MCHC: 32.6 g/dL (ref 30.0–36.0)
MCV: 94.3 fL (ref 80.0–100.0)
Platelets: 369 10*3/uL (ref 150–400)
RBC: 3.87 MIL/uL — ABNORMAL LOW (ref 4.22–5.81)
RDW: 14.8 % (ref 11.5–15.5)
WBC: 14 10*3/uL — AB (ref 4.0–10.5)
nRBC: 0 % (ref 0.0–0.2)

## 2019-01-04 LAB — GLUCOSE, CAPILLARY: Glucose-Capillary: 126 mg/dL — ABNORMAL HIGH (ref 70–99)

## 2019-01-04 LAB — RAPID URINE DRUG SCREEN, HOSP PERFORMED
Amphetamines: NOT DETECTED
Barbiturates: NOT DETECTED
Benzodiazepines: NOT DETECTED
COCAINE: NOT DETECTED
Opiates: NOT DETECTED
Tetrahydrocannabinol: NOT DETECTED

## 2019-01-04 LAB — DIFFERENTIAL
Abs Immature Granulocytes: 0.09 10*3/uL — ABNORMAL HIGH (ref 0.00–0.07)
Basophils Absolute: 0.1 10*3/uL (ref 0.0–0.1)
Basophils Relative: 1 %
EOS PCT: 1 %
Eosinophils Absolute: 0.2 10*3/uL (ref 0.0–0.5)
Immature Granulocytes: 1 %
Lymphocytes Relative: 9 %
Lymphs Abs: 1.2 10*3/uL (ref 0.7–4.0)
Monocytes Absolute: 0.7 10*3/uL (ref 0.1–1.0)
Monocytes Relative: 5 %
Neutro Abs: 11.6 10*3/uL — ABNORMAL HIGH (ref 1.7–7.7)
Neutrophils Relative %: 83 %

## 2019-01-04 LAB — ETHANOL: Alcohol, Ethyl (B): <10 mg/dL (ref <10)

## 2019-01-04 LAB — CBG MONITORING, ED: Glucose-Capillary: 154 mg/dL — ABNORMAL HIGH (ref 70–99)

## 2019-01-04 LAB — MRSA PCR SCREENING: MRSA by PCR: NEGATIVE

## 2019-01-04 LAB — PROTIME-INR
INR: 1.8 — ABNORMAL HIGH (ref 0.8–1.2)
PROTHROMBIN TIME: 21 s — AB (ref 11.4–15.2)

## 2019-01-04 LAB — I-STAT CREATININE, ED: Creatinine, Ser: 1.1 mg/dL (ref 0.61–1.24)

## 2019-01-04 LAB — APTT: aPTT: 32 seconds (ref 24–36)

## 2019-01-04 SURGERY — IR WITH ANESTHESIA
Anesthesia: General

## 2019-01-04 MED ORDER — VANCOMYCIN HCL IN DEXTROSE 1-5 GM/200ML-% IV SOLN
INTRAVENOUS | Status: AC
  Filled 2019-01-04: qty 200

## 2019-01-04 MED ORDER — LIDOCAINE 2% (20 MG/ML) 5 ML SYRINGE
INTRAMUSCULAR | Status: DC | PRN
  Administered 2019-01-04: 40 mg via INTRAVENOUS

## 2019-01-04 MED ORDER — LACTATED RINGERS IV SOLN
INTRAVENOUS | Status: DC | PRN
  Administered 2019-01-04: 18:00:00 via INTRAVENOUS

## 2019-01-04 MED ORDER — CLOPIDOGREL BISULFATE 300 MG PO TABS
ORAL_TABLET | ORAL | Status: AC
  Filled 2019-01-04: qty 1

## 2019-01-04 MED ORDER — ORAL CARE MOUTH RINSE
15.0000 mL | OROMUCOSAL | Status: DC
  Administered 2019-01-04 – 2019-01-05 (×10): 15 mL via OROMUCOSAL

## 2019-01-04 MED ORDER — ROCURONIUM BROMIDE 10 MG/ML (PF) SYRINGE
PREFILLED_SYRINGE | INTRAVENOUS | Status: DC | PRN
  Administered 2019-01-04: 40 mg via INTRAVENOUS

## 2019-01-04 MED ORDER — SODIUM CHLORIDE (PF) 0.9 % IJ SOLN
INTRAVENOUS | Status: DC | PRN
  Administered 2019-01-04: 25 ug via INTRA_ARTERIAL

## 2019-01-04 MED ORDER — PROPOFOL 500 MG/50ML IV EMUL
INTRAVENOUS | Status: DC | PRN
  Administered 2019-01-04: 50 ug/kg/min via INTRAVENOUS

## 2019-01-04 MED ORDER — SODIUM CHLORIDE 0.9 % IV SOLN
INTRAVENOUS | Status: DC
  Administered 2019-01-05 – 2019-01-06 (×2): via INTRAVENOUS

## 2019-01-04 MED ORDER — FENTANYL CITRATE (PF) 250 MCG/5ML IJ SOLN
INTRAMUSCULAR | Status: DC | PRN
  Administered 2019-01-04: 25 ug via INTRAVENOUS

## 2019-01-04 MED ORDER — DEXAMETHASONE SODIUM PHOSPHATE 10 MG/ML IJ SOLN
INTRAMUSCULAR | Status: DC | PRN
  Administered 2019-01-04: 4 mg via INTRAVENOUS

## 2019-01-04 MED ORDER — STROKE: EARLY STAGES OF RECOVERY BOOK: Freq: Once | Status: DC

## 2019-01-04 MED ORDER — TIROFIBAN HCL IN NACL 5-0.9 MG/100ML-% IV SOLN
INTRAVENOUS | Status: AC
  Filled 2019-01-04: qty 100

## 2019-01-04 MED ORDER — FENTANYL CITRATE (PF) 100 MCG/2ML IJ SOLN
50.0000 ug | Freq: Once | INTRAMUSCULAR | Status: AC
  Administered 2019-01-04: 50 ug via INTRAVENOUS
  Filled 2019-01-04: qty 2

## 2019-01-04 MED ORDER — ACETAMINOPHEN 160 MG/5ML PO SOLN: 650.0000 mg | ORAL | Status: DC | PRN

## 2019-01-04 MED ORDER — SODIUM CHLORIDE 0.9 % IV SOLN
INTRAVENOUS | Status: DC | PRN
  Administered 2019-01-04: 25 ug/min via INTRAVENOUS

## 2019-01-04 MED ORDER — ACETAMINOPHEN 650 MG RE SUPP: 650.0000 mg | RECTAL | Status: DC | PRN

## 2019-01-04 MED ORDER — PROPOFOL 1000 MG/100ML IV EMUL
5.0000 ug/kg/min | INTRAVENOUS | Status: DC
  Administered 2019-01-04: 60 ug/kg/min via INTRAVENOUS
  Filled 2019-01-04: qty 100

## 2019-01-04 MED ORDER — EPTIFIBATIDE 20 MG/10ML IV SOLN
INTRAVENOUS | Status: AC
  Filled 2019-01-04: qty 10

## 2019-01-04 MED ORDER — HYDROCHLOROTHIAZIDE 12.5 MG PO CAPS
12.5000 mg | ORAL_CAPSULE | Freq: Every day | ORAL | Status: DC
  Administered 2019-01-05 – 2019-01-08 (×4): 12.5 mg via ORAL
  Filled 2019-01-04 (×4): qty 1

## 2019-01-04 MED ORDER — SUCCINYLCHOLINE CHLORIDE 200 MG/10ML IV SOSY
PREFILLED_SYRINGE | INTRAVENOUS | Status: DC | PRN
  Administered 2019-01-04: 120 mg via INTRAVENOUS

## 2019-01-04 MED ORDER — PROPOFOL 10 MG/ML IV BOLUS
INTRAVENOUS | Status: DC | PRN
  Administered 2019-01-04: 120 mg via INTRAVENOUS

## 2019-01-04 MED ORDER — IOHEXOL 350 MG/ML SOLN
100.0000 mL | Freq: Once | INTRAVENOUS | Status: AC | PRN
  Administered 2019-01-04: 100 mL via INTRAVENOUS

## 2019-01-04 MED ORDER — CHLORHEXIDINE GLUCONATE 0.12% ORAL RINSE (MEDLINE KIT)
15.0000 mL | Freq: Two times a day (BID) | OROMUCOSAL | Status: DC
  Administered 2019-01-04 – 2019-01-05 (×2): 15 mL via OROMUCOSAL

## 2019-01-04 MED ORDER — SENNOSIDES-DOCUSATE SODIUM 8.6-50 MG PO TABS: 1.0000 | ORAL_TABLET | Freq: Every evening | ORAL | Status: DC | PRN

## 2019-01-04 MED ORDER — LOSARTAN POTASSIUM 50 MG PO TABS
100.0000 mg | ORAL_TABLET | Freq: Every day | ORAL | Status: DC
  Administered 2019-01-05 – 2019-01-08 (×4): 100 mg via ORAL
  Filled 2019-01-04 (×4): qty 2

## 2019-01-04 MED ORDER — LIDOCAINE HCL 1 % IJ SOLN
INTRAMUSCULAR | Status: AC
  Filled 2019-01-04: qty 20

## 2019-01-04 MED ORDER — TICAGRELOR 90 MG PO TABS
ORAL_TABLET | ORAL | Status: AC
  Filled 2019-01-04: qty 2

## 2019-01-04 MED ORDER — PANTOPRAZOLE SODIUM 40 MG IV SOLR
40.0000 mg | INTRAVENOUS | Status: DC
  Administered 2019-01-05 – 2019-01-07 (×4): 40 mg via INTRAVENOUS
  Filled 2019-01-04 (×4): qty 40

## 2019-01-04 MED ORDER — SODIUM CHLORIDE 0.9 % IV SOLN: 250.0000 mL | INTRAVENOUS | Status: DC

## 2019-01-04 MED ORDER — CLEVIDIPINE BUTYRATE 0.5 MG/ML IV EMUL: 0.0000 mg/h | INTRAVENOUS | Status: DC

## 2019-01-04 MED ORDER — IOHEXOL 300 MG/ML  SOLN
150.0000 mL | Freq: Once | INTRAMUSCULAR | Status: AC | PRN
  Administered 2019-01-04: 75 mL via INTRA_ARTERIAL

## 2019-01-04 MED ORDER — ACETAMINOPHEN 325 MG PO TABS: 650.0000 mg | ORAL_TABLET | ORAL | Status: DC | PRN

## 2019-01-04 MED ORDER — NITROGLYCERIN 1 MG/10 ML FOR IR/CATH LAB
INTRA_ARTERIAL | Status: AC
  Filled 2019-01-04: qty 10

## 2019-01-04 MED ORDER — GLYCOPYRROLATE PF 0.2 MG/ML IJ SOSY
PREFILLED_SYRINGE | INTRAMUSCULAR | Status: DC | PRN
  Administered 2019-01-04: .1 mg via INTRAVENOUS

## 2019-01-04 MED ORDER — FENTANYL 2500MCG IN NS 250ML (10MCG/ML) PREMIX INFUSION
25.0000 ug/h | INTRAVENOUS | Status: DC
  Administered 2019-01-04: 50 ug/h via INTRAVENOUS
  Filled 2019-01-04: qty 250

## 2019-01-04 MED ORDER — FENTANYL BOLUS VIA INFUSION
25.0000 ug | INTRAVENOUS | Status: DC | PRN
  Filled 2019-01-04: qty 25

## 2019-01-04 MED ORDER — SODIUM CHLORIDE 0.9 % IV SOLN
INTRAVENOUS | Status: AC
  Administered 2019-01-05: 17:00:00 via INTRAVENOUS

## 2019-01-04 MED ORDER — CLEVIDIPINE BUTYRATE 0.5 MG/ML IV EMUL
0.0000 mg/h | INTRAVENOUS | Status: AC
  Administered 2019-01-04: 2 mg/h via INTRAVENOUS
  Administered 2019-01-05: 4 mg/h via INTRAVENOUS
  Administered 2019-01-05 (×2): 5 mg/h via INTRAVENOUS
  Filled 2019-01-04 (×4): qty 50

## 2019-01-04 MED ORDER — VITAMIN B-12 1000 MCG PO TABS
2000.0000 ug | ORAL_TABLET | Freq: Every morning | ORAL | Status: DC
  Administered 2019-01-05 – 2019-01-07 (×3): 2000 ug via ORAL
  Filled 2019-01-04 (×4): qty 2

## 2019-01-04 MED ORDER — INSULIN ASPART 100 UNIT/ML ~~LOC~~ SOLN
0.0000 [IU] | SUBCUTANEOUS | Status: DC
  Administered 2019-01-04: 2 [IU] via SUBCUTANEOUS
  Administered 2019-01-05: 3 [IU] via SUBCUTANEOUS
  Administered 2019-01-05: 0 [IU] via SUBCUTANEOUS
  Administered 2019-01-05: 2 [IU] via SUBCUTANEOUS
  Administered 2019-01-05: 3 [IU] via SUBCUTANEOUS
  Administered 2019-01-07 (×2): 2 [IU] via SUBCUTANEOUS

## 2019-01-04 MED ORDER — LOSARTAN POTASSIUM-HCTZ 100-12.5 MG PO TABS: 1.0000 | ORAL_TABLET | Freq: Every day | ORAL | Status: DC

## 2019-01-04 MED ORDER — NEBIVOLOL HCL 10 MG PO TABS
10.0000 mg | ORAL_TABLET | Freq: Every day | ORAL | Status: DC
  Filled 2019-01-04: qty 1

## 2019-01-04 MED ORDER — VANCOMYCIN HCL 1000 MG IV SOLR
INTRAVENOUS | Status: DC | PRN
  Administered 2019-01-04: 1000 mg via INTRAVENOUS

## 2019-01-04 MED ORDER — ONDANSETRON HCL 4 MG/2ML IJ SOLN
INTRAMUSCULAR | Status: DC | PRN
  Administered 2019-01-04: 4 mg via INTRAVENOUS

## 2019-01-04 MED ORDER — AMLODIPINE BESYLATE 10 MG PO TABS
10.0000 mg | ORAL_TABLET | Freq: Every day | ORAL | Status: DC
  Administered 2019-01-05 – 2019-01-08 (×4): 10 mg via ORAL
  Filled 2019-01-04 (×4): qty 1

## 2019-01-04 MED ORDER — ASPIRIN 325 MG PO TABS
ORAL_TABLET | ORAL | Status: AC
  Filled 2019-01-04: qty 1

## 2019-01-04 MED ORDER — NYSTATIN 100000 UNIT/GM EX CREA
1.0000 "application " | TOPICAL_CREAM | Freq: Three times a day (TID) | CUTANEOUS | Status: DC
  Administered 2019-01-04 – 2019-01-07 (×10): 1 via TOPICAL
  Filled 2019-01-04 (×2): qty 15

## 2019-01-04 NOTE — H&P (Signed)
Admission H&P    Chief Complaint: Acute onset of right sided weakness  HPI: [name] is an 83 y.o. male who presented to the [facility] ED in the late afternoon today with slurred speech first noticed at noon. LKN is the same as TOSO. When he arrived at the ED, right sided facial droop and slurred speech were noted. After Teleneurology evaluation, he was out of the time window for tPA. CTA of head and neck with CTP were obtained, revealing a left MCA occlusion and significant perfusion deficit in the left MCA territory of 95 cc. NIHSS was 10 at OSH. His mRS was rated as 0 in the context of a left AKA. He is on Coumadin for atrial fibrillation. His INR was 1.8.   LSN: 1200 tPA Given: No: Out of tPA time window   Past Medical History:  Diagnosis Date  . Arteriosclerotic cardiovascular disease (ASCVD)    CABG in 1990s; 2002 total obstruction of LAD, CX and RCA with patent grafts and nl EF; Stress nuc. 2008 - mild LV dilation; normal EF; questionable small anteroapical scar; no ischemia  . Arthritis    "left knee" (11/08/2015)  . Atrial fibrillation ([place])   . Cellulitis 11/08/2015   "both feet"  . Chronic anticoagulation   . Dental crowns present   . Diabetic peripheral neuropathy (HCC)    bilateral lower leg, hands  . History of blood transfusion    "related to my leg surgery?"  . Hypertension    states under control with meds., has been on med. x "long time"  . Iron deficiency anemia   . Jaw cancer ([place]) 1990s   "squamous cell"  . Myocardial infarction ([place]) 1990s   "after my jaw OR"  . Pedal edema    "not a lot", per pt.  . Peripheral vascular disease ([place])   . Presence of retained hardware 07/2015   failed hardware jaw  . Runny nose 07/27/2015   clear drainage, per pt.  . Type II diabetes mellitus ([place])    "diet controlled" (11/08/2015)    Past Surgical History:  Procedure Laterality Date  . AMPUTATION Left 11/11/2015   Procedure: LEFT ABOVE KNEE AMPUTATION;  Surgeon:  Angelia Mould, MD;  Location: [place];  Service: Vascular;  Laterality: Left;  . CARDIAC CATHETERIZATION  1990s; 04/16/2001  . CATARACT EXTRACTION W/ INTRAOCULAR LENS  IMPLANT, BILATERAL Bilateral 09/2015  . COLONOSCOPY N/A 11/27/2014   Procedure: COLONOSCOPY;  Surgeon: Rogene [place], MD;  [facility];  Service: Endoscopy;  Laterality: N/A;  830  . CORONARY ARTERY BYPASS GRAFT  1990's   "CABG X4"  . ESOPHAGOGASTRODUODENOSCOPY N/A 02/14/2018   Procedure: ESOPHAGOGASTRODUODENOSCOPY (EGD);  Surgeon: Rogene [place], MD;  [facility];  Service: Endoscopy;  Laterality: N/A;  . ESOPHAGOGASTRODUODENOSCOPY (EGD) WITH PROPOFOL N/A 02/25/2018   Procedure: ESOPHAGOGASTRODUODENOSCOPY (EGD) WITH PROPOFOL;  Surgeon: Rogene [place], MD;  [facility];  Service: Endoscopy;  Laterality: N/A;  . FRACTURE SURGERY    . GIVENS CAPSULE STUDY N/A 02/15/2018   Procedure: GIVENS CAPSULE STUDY;  Surgeon: Rogene [place], MD;  [facility];  Service: Endoscopy;  Laterality: N/A;  . GIVENS CAPSULE STUDY N/A 03/27/2018   Procedure: GIVENS CAPSULE STUDY;  Surgeon: Rogene [place], MD;  [facility];  Service: Endoscopy;  Laterality: N/A;  . I&D EXTREMITY Left 11/10/2015   Procedure: INCISION AND DRAINAGE LEFT FOOT, AMPUTATION OF LEFT THIRD TOE;  Surgeon: Conrad Vine [place], MD;  [facility];  Service: Vascular;  Laterality: Left;  . INCISION AND DRAINAGE ABSCESS Left 06/16/2005   wide exc. abscess 5th toe  . IR ANGIOGRAM VISCERAL SELECTIVE  03/28/2018  . IR ANGIOGRAM VISCERAL SELECTIVE  03/28/2018  . IR US GUIDE VASC ACCESS LEFT  03/28/2018  . MANDIBULAR HARDWARE REMOVAL Left 08/02/2015   Procedure:  HARDWARE REMOVAL TWO MANDIBULAR SCREWS;  Surgeon: Jannette Fogo, DDS;  Location: [place];  Service: Oral Surgery;  Laterality: Left;  . ORIF TIBIA FRACTURE Left ~ 1970  . PERIPHERAL VASCULAR CATHETERIZATION N/A 08/14/2016   Procedure: Abdominal Aortogram  w/Lower Extremity;  Surgeon: Angelia Mould, MD;  Location: [facility];  Service: Cardiovascular;  Laterality: N/A;  . SQUAMOUS CELL CARCINOMA EXCISION Left 1993   "took my jaw out; got cadavar in there now"  . TONSILLECTOMY      Family History  Problem Relation Age of Onset  . Heart disease Mother    Social History:  reports that he has never smoked. He has never used smokeless tobacco. He reports that he does not drink alcohol or use drugs.  Allergies:  Allergies  Allergen Reactions  . Lipitor [Atorvastatin] Other (See Comments)    SEVERE HEADACHE  . Simvastatin Other (See Comments)    MUSCLE ACHES  . Penicillins Rash    Has patient had a PCN reaction causing immediate rash, facial/tongue/throat swelling, SOB or lightheadedness with hypotension: Yes Has patient had a PCN reaction causing severe rash involving mucus membranes or skin necrosis: No Has patient had a PCN reaction that required hospitalization No Has patient had a PCN reaction occurring within the last 10 years: No If all of the above answers are "NO", then may proceed with Cephalosporin use.   . Sulfa Antibiotics Rash    Tolerates silver sulfadiazine cream at home    Medications Prior to Admission  Medication Sig Dispense Refill  . amLODipine (NORVASC) 10 MG tablet Take 1 tablet (10 mg total) by mouth daily. 30 tablet 6  . losartan-hydrochlorothiazide (HYZAAR) 100-12.5 MG tablet Take 1 tablet by mouth daily.    Marland Kitchen lovastatin (MEVACOR) 10 MG tablet Take 10 mg by mouth daily.     . nebivolol (BYSTOLIC) 10 MG tablet Take 10 mg by mouth daily.    . pantoprazole (PROTONIX) 40 MG tablet Take 1 tablet (40 mg total) by mouth daily. 30 tablet 0  . warfarin (COUMADIN) 1 MG tablet Take 2 tablets daily except 1 tablet on Mondays, Wednesdays and Fridays or as directed 60 tablet 3  . cetirizine (ZYRTEC) 10 MG tablet Take 10 mg by mouth daily.    . feeding supplement (BOOST / RESOURCE BREEZE) LIQD Take 1 Container  by mouth 3 (three) times daily between meals. (Patient taking differently: Take 1 Container by mouth 2 (two) times daily. ) 90 Container 4  . ferrous sulfate 325 (65 FE) MG tablet Take 65 mg by mouth daily with breakfast.    . methocarbamol (ROBAXIN) 500 MG tablet Take 1-2 tablets (500-1,000 mg total) by mouth every 6 (six) hours as needed for muscle spasms. 60 tablet 0  . Multiple Vitamins-Minerals (CENTRUM SILVER PO) Take 1 tablet by mouth daily.      Marland Kitchen nystatin cream (MYCOSTATIN) Apply 1 application topically 3 (three) times daily.    . Omega-3 Fatty Acids (FISH OIL) 1200 MG CAPS Take 1,200 mg by mouth daily.    . Pediatric Multivitamins-Iron (FLINTSTONES PLUS IRON PO) Take 1 tablet by mouth daily.    Marland Kitchen  Probiotic Product (PHILLIPS COLON HEALTH PO) Take 1 capsule by mouth daily.     . silver sulfADIAZINE (SILVADENE) 1 % cream Apply topically daily. (Patient taking differently: Apply 1 application topically daily. Applied to affected area of foot.) 50 g 0  . traMADol (ULTRAM) 50 MG tablet Take 1 tablet (50 mg total) by mouth every 6 (six) hours as needed. 15 tablet 0  . vitamin B-12 (CYANOCOBALAMIN) 1000 MCG tablet Take 2,000 mcg by mouth every morning.      ROS: Unable to obtain due to aphasia.   Physical Examination: Blood pressure 138/70, pulse (!) 57, temperature 98.4 F (36.9 C), temperature source Oral, resp. rate 10, height 5' 8"  (1.727 m), weight 79.8 kg, SpO2 100 %.  HEENT-  [place]/AT  Lungs - Respirations unlabored Extremities - Left AKA  Neurologic Examination: Ment: Awake and alert. Nonsensical, garbled answers to all questions. Some answers are perseverative. Unable to follow commands.  CN: PERRL. Will gaze at examiner briefly but will not blink to threat. Eyes conjugate at the midline with some tracking noted. No forced gaze deviation or nystagmus. Mild facial asymmetry noted. Unable to perform more detailed CN exam as patient is being prepped in the angio suite.  Motor: Upper  extremities remain elevated for 3-5 seconds after being raised by examiner. Does not resist with upper extremities or RLE when asked.  Sensory: No reaction to light pinch BUE, or to plantar stimulation RLE.  Reflexes: 2+ bilateral brachioradialis. Hypoactive right patellar. Left AKA. Right toe mute.  Cerebellar/Gait: Unable to assess.   Results for orders placed or performed during the hospital encounter of 01/04/19 (from the past 48 hour(s))  Ethanol     Status: None   Collection Time: 01/04/19  4:19 PM  Result Value Ref Range   Alcohol, Ethyl (B) <10 <10 mg/dL    Comment: (NOTE) Lowest detectable limit for serum alcohol is 10 mg/dL. For medical purposes only. Performed at [facility], [address] [place], [place] [address]   Protime-INR     Status: Abnormal   Collection Time: 01/04/19  4:19 PM  Result Value Ref Range   Prothrombin Time 21.0 (H) 11.4 - 15.2 seconds   INR 1.8 (H) 0.8 - 1.2    Comment: (NOTE) INR goal varies based on device and disease states. Performed at [facility], [address], [place], [place] [address]   APTT     Status: None   Collection Time: 01/04/19  4:19 PM  Result Value Ref Range   aPTT 32 24 - 36 seconds    Comment: Performed at [facility], [address] [place], [place] [address]  CBC     Status: Abnormal   Collection Time: 01/04/19  4:19 PM  Result Value Ref Range   WBC 14.0 (H) 4.0 - 10.5 K/uL   RBC 3.87 (L) 4.22 - 5.81 MIL/uL   Hemoglobin 11.9 (L) 13.0 - 17.0 g/dL   HCT 36.5 (L) 39.0 - 52.0 %   MCV 94.3 80.0 - 100.0 fL   MCH 30.7 26.0 - 34.0 pg   MCHC 32.6 30.0 - 36.0 g/dL   RDW 14.8 11.5 - 15.5 %   Platelets 369 150 - 400 K/uL   nRBC 0.0 0.0 - 0.2 %    Comment: Performed at [facility], [address] [place], [place] [address]  Differential     Status: Abnormal   Collection Time: 01/04/19  4:19 PM  Result Value Ref Range   Neutrophils Relative % 83 %  Neutro Abs 11.6 (H) 1.7 - 7.7 K/uL   Lymphocytes Relative 9 %   Lymphs  Abs 1.2 0.7 - 4.0 K/uL   Monocytes Relative 5 %   Monocytes Absolute 0.7 0.1 - 1.0 K/uL   Eosinophils Relative 1 %   Eosinophils Absolute 0.2 0.0 - 0.5 K/uL   Basophils Relative 1 %   Basophils Absolute 0.1 0.0 - 0.1 K/uL   Immature Granulocytes 1 %   Abs Immature Granulocytes 0.09 (H) 0.00 - 0.07 K/uL    Comment: Performed at [facility], [address], [place], [place] [address]  Comprehensive metabolic panel     Status: Abnormal   Collection Time: 01/04/19  4:19 PM  Result Value Ref Range   Sodium 138 135 - 145 mmol/L   Potassium 3.9 3.5 - 5.1 mmol/L   Chloride 109 98 - 111 mmol/L   CO2 19 (L) 22 - 32 mmol/L   Glucose, Bld 162 (H) 70 - 99 mg/dL   BUN 26 (H) 8 - 23 mg/dL   Creatinine, Ser 1.25 (H) 0.61 - 1.24 mg/dL   Calcium 8.6 (L) 8.9 - 10.3 mg/dL   Total Protein 7.8 6.5 - 8.1 g/dL   Albumin 3.5 3.5 - 5.0 g/dL   AST 20 15 - 41 U/L   ALT 18 0 - 44 U/L   Alkaline Phosphatase 110 38 - 126 U/L   Total Bilirubin 0.5 0.3 - 1.2 mg/dL   GFR calc non Af Amer 54 (L) >60 mL/min   GFR calc Af Amer >60 >60 mL/min   Anion gap 10 5 - 15    Comment: Performed at [facility], [address] [place], [place] [address]  CBG monitoring, ED     Status: Abnormal   Collection Time: 01/04/19  4:34 PM  Result Value Ref Range   Glucose-Capillary 154 (H) 70 - 99 mg/dL  I-stat Creatinine, ED     Status: None   Collection Time: 01/04/19  4:44 PM  Result Value Ref Range   Creatinine, Ser 1.10 0.61 - 1.24 mg/dL   Ct Angio Head W Or Wo Contrast  Result Date: 01/04/2019 CLINICAL DATA:  83 year old male with aphasia and right facial droop. EXAM: CT ANGIOGRAPHY HEAD AND NECK CT PERFUSION BRAIN TECHNIQUE: Multidetector CT imaging of the head and neck was performed using the standard protocol during bolus administration of intravenous contrast. Multiplanar CT image reconstructions and MIPs were obtained to evaluate the vascular anatomy. Carotid stenosis measurements (when applicable) are obtained  utilizing NASCET criteria, using the distal internal carotid diameter as the denominator. Multiphase CT imaging of the brain was performed following IV bolus contrast injection. Subsequent parametric perfusion maps were calculated using RAPID software. CONTRAST:  116m OMNIPAQUE IOHEXOL 350 MG/ML SOLN COMPARISON:  Head CT without contrast 1620 hours today. FINDINGS: CT Brain Perfusion Findings: CBF (<30%) Volume: 0 milliliters (up to 15 milliliters using CBF less than 38% criteria) ASPECTS 10 at 1620 hours today. Perfusion (Tmax>6.0s) volume: 95 milliliters, with hypoperfusion index of 0.2 (favorable) Mismatch Volume: 95 milliliters Infarction Location:Little if any core infarct, penumbra confined to the left MCA territory. CTA NECK Skeleton: Chronic deformity with postoperative changes to the mandible. Prior sternotomy. Mild for age cervical spine degeneration. No acute osseous abnormality identified. Upper chest: Prior CABG. Other neck: Mild left thyroid goiter. Atrophied left submandibular gland. No neck mass or lymphadenopathy. Aortic arch: Mild Calcified aortic atherosclerosis. 3 vessel arch configuration. Right carotid system: No stenosis.  Minor atherosclerosis. Left carotid system: No left CCA  origin stenosis. Soft plaque in the distal left CCA and at the left ICA origin (low-density on series 4, image 81) not resulting in hemodynamically significant stenosis. Some superimposed left ICA bulb calcified plaque. Vertebral arteries: No proximal right subclavian artery stenosis despite plaque. Normal right vertebral artery origin. Patent right vertebral artery to the skull base without stenosis. No proximal left subclavian artery stenosis despite some plaque. Mild plaque at the left vertebral artery origin with mild if any stenosis. Patent left vertebral artery to the skull base without additional stenosis. CTA HEAD Posterior circulation: Mildly dominant left vertebral artery. Patent PICA origins. No distal  vertebral stenosis. Patent vertebrobasilar junction and basilar artery without stenosis. SCA and left PCA origins are normal. Fetal type right PCA origin. Left posterior communicating artery diminutive or absent. Left PCA branches are within normal limits. Right PCA branches are within normal limits. Anterior circulation: Both ICA siphons are patent. On the left there is mild calcified plaque without stenosis. Similar calcified plaque on the right without stenosis. Normal right posterior communicating artery origin, the vessel itself is tortuous. Patent carotid termini, MCA and ACA origins. Anterior communicating artery and bilateral ACA branches are within normal limits. Right MCA M1, bifurcation, and right MCA branches are within normal limits. The left MCA M1 is thrombosed 8 or 10 millimeters from its origin. There is some could reconstitution of the left MCA branches (series 10, image 20). Venous sinuses: Patent. Anatomic variants: Mildly dominant left vertebral artery. Fetal type right PCA origin. Review of the MIP images confirms the above findings IMPRESSION: 1. Positive for Left MCA M1 ELVO with favorable CT Perfusion parameters: - estimated core infarct 0-15 mL and Left MCA penumbra of at least 95 mL. HPI 0.2. - this was discussed by telephone with tele-neurology Dr. Meryl Crutch on 01/04/2019 at 1706 hours. 2. Other vascular findings include soft plaque at the left ICA origin. Atherosclerosis but no hemodynamically significant stenosis elsewhere in the head or neck. Electronically Signed   By: Genevie Ann M.D.   On: 01/04/2019 17:10   Ct Angio Neck W Or Wo Contrast  Result Date: 01/04/2019 CLINICAL DATA:  83 year old male with aphasia and right facial droop. EXAM: CT ANGIOGRAPHY HEAD AND NECK CT PERFUSION BRAIN TECHNIQUE: Multidetector CT imaging of the head and neck was performed using the standard protocol during bolus administration of intravenous contrast. Multiplanar CT image reconstructions and MIPs  were obtained to evaluate the vascular anatomy. Carotid stenosis measurements (when applicable) are obtained utilizing NASCET criteria, using the distal internal carotid diameter as the denominator. Multiphase CT imaging of the brain was performed following IV bolus contrast injection. Subsequent parametric perfusion maps were calculated using RAPID software. CONTRAST:  118m OMNIPAQUE IOHEXOL 350 MG/ML SOLN COMPARISON:  Head CT without contrast 1620 hours today. FINDINGS: CT Brain Perfusion Findings: CBF (<30%) Volume: 0 milliliters (up to 15 milliliters using CBF less than 38% criteria) ASPECTS 10 at 1620 hours today. Perfusion (Tmax>6.0s) volume: 95 milliliters, with hypoperfusion index of 0.2 (favorable) Mismatch Volume: 95 milliliters Infarction Location:Little if any core infarct, penumbra confined to the left MCA territory. CTA NECK Skeleton: Chronic deformity with postoperative changes to the mandible. Prior sternotomy. Mild for age cervical spine degeneration. No acute osseous abnormality identified. Upper chest: Prior CABG. Other neck: Mild left thyroid goiter. Atrophied left submandibular gland. No neck mass or lymphadenopathy. Aortic arch: Mild Calcified aortic atherosclerosis. 3 vessel arch configuration. Right carotid system: No stenosis.  Minor atherosclerosis. Left carotid system: No left CCA origin stenosis.  Soft plaque in the distal left CCA and at the left ICA origin (low-density on series 4, image 81) not resulting in hemodynamically significant stenosis. Some superimposed left ICA bulb calcified plaque. Vertebral arteries: No proximal right subclavian artery stenosis despite plaque. Normal right vertebral artery origin. Patent right vertebral artery to the skull base without stenosis. No proximal left subclavian artery stenosis despite some plaque. Mild plaque at the left vertebral artery origin with mild if any stenosis. Patent left vertebral artery to the skull base without additional  stenosis. CTA HEAD Posterior circulation: Mildly dominant left vertebral artery. Patent PICA origins. No distal vertebral stenosis. Patent vertebrobasilar junction and basilar artery without stenosis. SCA and left PCA origins are normal. Fetal type right PCA origin. Left posterior communicating artery diminutive or absent. Left PCA branches are within normal limits. Right PCA branches are within normal limits. Anterior circulation: Both ICA siphons are patent. On the left there is mild calcified plaque without stenosis. Similar calcified plaque on the right without stenosis. Normal right posterior communicating artery origin, the vessel itself is tortuous. Patent carotid termini, MCA and ACA origins. Anterior communicating artery and bilateral ACA branches are within normal limits. Right MCA M1, bifurcation, and right MCA branches are within normal limits. The left MCA M1 is thrombosed 8 or 10 millimeters from its origin. There is some could reconstitution of the left MCA branches (series 10, image 20). Venous sinuses: Patent. Anatomic variants: Mildly dominant left vertebral artery. Fetal type right PCA origin. Review of the MIP images confirms the above findings IMPRESSION: 1. Positive for Left MCA M1 ELVO with favorable CT Perfusion parameters: - estimated core infarct 0-15 mL and Left MCA penumbra of at least 95 mL. HPI 0.2. - this was discussed by telephone with tele-neurology Dr. Meryl Crutch on 01/04/2019 at 1706 hours. 2. Other vascular findings include soft plaque at the left ICA origin. Atherosclerosis but no hemodynamically significant stenosis elsewhere in the head or neck. Electronically Signed   By: Genevie Ann M.D.   On: 01/04/2019 17:10   Ct Cerebral Perfusion W Contrast  Result Date: 01/04/2019 CLINICAL DATA:  83 year old male with aphasia and right facial droop. EXAM: CT ANGIOGRAPHY HEAD AND NECK CT PERFUSION BRAIN TECHNIQUE: Multidetector CT imaging of the head and neck was performed using the  standard protocol during bolus administration of intravenous contrast. Multiplanar CT image reconstructions and MIPs were obtained to evaluate the vascular anatomy. Carotid stenosis measurements (when applicable) are obtained utilizing NASCET criteria, using the distal internal carotid diameter as the denominator. Multiphase CT imaging of the brain was performed following IV bolus contrast injection. Subsequent parametric perfusion maps were calculated using RAPID software. CONTRAST:  144m OMNIPAQUE IOHEXOL 350 MG/ML SOLN COMPARISON:  Head CT without contrast 1620 hours today. FINDINGS: CT Brain Perfusion Findings: CBF (<30%) Volume: 0 milliliters (up to 15 milliliters using CBF less than 38% criteria) ASPECTS 10 at 1620 hours today. Perfusion (Tmax>6.0s) volume: 95 milliliters, with hypoperfusion index of 0.2 (favorable) Mismatch Volume: 95 milliliters Infarction Location:Little if any core infarct, penumbra confined to the left MCA territory. CTA NECK Skeleton: Chronic deformity with postoperative changes to the mandible. Prior sternotomy. Mild for age cervical spine degeneration. No acute osseous abnormality identified. Upper chest: Prior CABG. Other neck: Mild left thyroid goiter. Atrophied left submandibular gland. No neck mass or lymphadenopathy. Aortic arch: Mild Calcified aortic atherosclerosis. 3 vessel arch configuration. Right carotid system: No stenosis.  Minor atherosclerosis. Left carotid system: No left CCA origin stenosis. Soft plaque in the  distal left CCA and at the left ICA origin (low-density on series 4, image 81) not resulting in hemodynamically significant stenosis. Some superimposed left ICA bulb calcified plaque. Vertebral arteries: No proximal right subclavian artery stenosis despite plaque. Normal right vertebral artery origin. Patent right vertebral artery to the skull base without stenosis. No proximal left subclavian artery stenosis despite some plaque. Mild plaque at the left  vertebral artery origin with mild if any stenosis. Patent left vertebral artery to the skull base without additional stenosis. CTA HEAD Posterior circulation: Mildly dominant left vertebral artery. Patent PICA origins. No distal vertebral stenosis. Patent vertebrobasilar junction and basilar artery without stenosis. SCA and left PCA origins are normal. Fetal type right PCA origin. Left posterior communicating artery diminutive or absent. Left PCA branches are within normal limits. Right PCA branches are within normal limits. Anterior circulation: Both ICA siphons are patent. On the left there is mild calcified plaque without stenosis. Similar calcified plaque on the right without stenosis. Normal right posterior communicating artery origin, the vessel itself is tortuous. Patent carotid termini, MCA and ACA origins. Anterior communicating artery and bilateral ACA branches are within normal limits. Right MCA M1, bifurcation, and right MCA branches are within normal limits. The left MCA M1 is thrombosed 8 or 10 millimeters from its origin. There is some could reconstitution of the left MCA branches (series 10, image 20). Venous sinuses: Patent. Anatomic variants: Mildly dominant left vertebral artery. Fetal type right PCA origin. Review of the MIP images confirms the above findings IMPRESSION: 1. Positive for Left MCA M1 ELVO with favorable CT Perfusion parameters: - estimated core infarct 0-15 mL and Left MCA penumbra of at least 95 mL. HPI 0.2. - this was discussed by telephone with tele-neurology Dr. Meryl Crutch on 01/04/2019 at 1706 hours. 2. Other vascular findings include soft plaque at the left ICA origin. Atherosclerosis but no hemodynamically significant stenosis elsewhere in the head or neck. Electronically Signed   By: Genevie Ann M.D.   On: 01/04/2019 17:10   Ct Head Code Stroke Wo Contrast  Addendum Date: 01/04/2019   ADDENDUM REPORT: 01/04/2019 16:44 ADDENDUM: Study discussed by telephone with Dr. Davonna Belling on 01/04/2019 at 1633 hours. Electronically Signed   By: Genevie Ann M.D.   On: 01/04/2019 16:44   Result Date: 01/04/2019 CLINICAL DATA:  Code stroke. 83 year old male with slurred speech at noon today. Weakness. Right facial droop. EXAM: CT HEAD WITHOUT CONTRAST TECHNIQUE: Contiguous axial images were obtained from the base of the skull through the vertex without intravenous contrast. COMPARISON:  None. FINDINGS: Brain: Cerebral volume is within normal limits for age. No ventriculomegaly. No acute intracranial hemorrhage identified. No midline shift, mass effect, or evidence of intracranial mass lesion. Small age indeterminate hypodensity in the ventral left thalamus on series 2, image 15. Largely normal gray-white matter differentiation throughout the brain otherwise. No cortically based acute infarct identified. Vascular: Calcified atherosclerosis at the skull base. No suspicious intracranial vascular hyperdensity. Skull: Negative. Sinuses/Orbits: Visualized paranasal sinuses and mastoids are clear. Other: No acute orbit or scalp soft tissue finding. ASPECTS [facility] Stroke Program Early CT Score) - Ganglionic level infarction (caudate, lentiform nuclei, internal capsule, insula, M1-M3 cortex): 7 - Supraganglionic infarction (M4-M6 cortex): 3 Total score (0-10 with 10 being normal): 10 IMPRESSION: 1. Small age indeterminate small lacunar infarct in the ventral left thalamus. 2. No acute cortically based infarct identified. No acute intracranial hemorrhage, or mass effect. 3. ASPECTS is 10. Electronically Signed: By: Herminio Heads.D.  On: 01/04/2019 16:32    Assessment: 83 y.o. male presenting with aphasia secondary to left MCA occlusion. 1. NIHSS of 10 at OSH. Unable to obtain comprehensive exam on arrival to [facility] due to being prepped in the angio suite.  2. Did not receive tPA as he was out of the time window.  3. No contraindications to VIR. Risks/benefits preliminarily discussed with his wife by  telephone. Final consent was obtained by Dr. Estanislado Pandy. 4. Atrial fibrillation on Coumadin. INR 1.8. Most likely etiology for the stroke is cardioembolic due to subtherapeutic INR.  5. CTP with 95 cc perfusion deficit in the left MCA territory, with no core detectable.  6. Stroke Risk Factors - Multiple stroke risk factors as per HPI  Plan: 1. In VIR for clot retrieval procedure 2. Following VIR, will be admitted to the Neuro ICU under the Neurology service 3. Post-VIR orders per Dr. Estanislado Pandy 4. BP management with clevidipine. SBP goal of 120-140.  5. No antiplatelet medications or anticoagulants for at least 24 hours following VIR. Can resume anticoagulation if repeat CT at 24 hours is negative for hemorrhage.  6. MRI of the brain without contrast 7. TTE 8. PT consult, OT consult, Speech consult 9. Telemetry monitoring 10 SSI for blood sugar management 11. Allergic to Lipitor and simvastatin. Patient's lovastatin is not on formulary. Stroke team to determine optimal statin in the AM.   40 minutes spent in the emergent neurological evaluation and management of this critically ill acute stroke patient. Time spent included coordination of care.   Electronically signed: Dr. Kerney Elbe 01/04/2019, 8:24 PM

## 2019-01-04 NOTE — Progress Notes (Signed)
Post procedure  CT brain no ICH,mass effect or shift noted. Lt LE above knee amputation. RT LE dopplerable DP and PT. Lt groin sheatheft in situ in view of patient being on coumadin. Left intubated because of poor responsiveness due to aphasia prior to thr procedure.Marland Kitchen S.DeveshwarMD

## 2019-01-04 NOTE — Anesthesia Procedure Notes (Signed)
Procedure Name: Intubation Date/Time: 01/04/2019 6:09 PM Performed by: Wilburn Cornelia, CRNA Pre-anesthesia Checklist: Patient identified, Emergency Drugs available, Suction available, Patient being monitored and Timeout performed Patient Re-evaluated:Patient Re-evaluated prior to induction Oxygen Delivery Method: Circle system utilized Preoxygenation: Pre-oxygenation with 100% oxygen Induction Type: IV induction, Cricoid Pressure applied and Rapid sequence Laryngoscope Size: 4 and Mac Grade View: Grade II Tube type: Oral Tube size: 7.5 mm Number of attempts: 1 Airway Equipment and Method: Stylet Placement Confirmation: ETT inserted through vocal cords under direct vision,  positive ETCO2,  CO2 detector and breath sounds checked- equal and bilateral Secured at: 23 cm Tube secured with: Tape Dental Injury: Teeth and Oropharynx as per pre-operative assessment

## 2019-01-04 NOTE — Progress Notes (Signed)
eLink Physician-Brief Progress Note Patient Name: TALAN GILDNER DOB: [birth year] MRN: [id number]   Date of Service  01/04/2019  HPI/Events of Note  84 y/o M history of afib on warfarin, CAD s/p CABG, HTN, DM, CKD, PVD s/p left AKA presented with slurred speech found to have a left MCA occlusion s/p IR revascularization.  eICU Interventions  Maintain BP to ensure adequate CPP Glucose control Warfarin on hold for afib     Intervention Category Major Interventions: Hypotension - evaluation and management;Intracranial hypertension - evaluation and management  Judd Lien 01/04/2019, 8:01 PM

## 2019-01-04 NOTE — Plan of Care (Signed)
  Problem: Ischemic Stroke/TIA Tissue Perfusion: Goal: Complications of ischemic stroke/TIA will be minimized Outcome: Progressing   

## 2019-01-04 NOTE — Sedation Documentation (Addendum)
Patient will remain intubated per Dr. Estanislado Pandy. Unable to obtain NIH at this time.

## 2019-01-04 NOTE — ED Triage Notes (Signed)
Pt and family reports noticed slurred speech at 12noon today.  Reports noticed pt was very weak.  Has left AKA.  R sided facial droop and slurred speech.  Pt denies pain.

## 2019-01-04 NOTE — Consult Note (Addendum)
[facility] TeleNeurology Consult Services   Date of Service:   01/04/2019 16:24:22  Impression:     .  Left Hemispheric Infarct     .  MCA Distribution Infarct  Comments/Sign-Out: 83 y/o man with h/o atrial fibrillation presents with expressive aphasia and right hemiparesis. Not a TPA candidate. CTA/CTP pending to evaluate for LVO  Metrics: Last Known Well: 01/04/2019 12:00:00 [facility] Notification Time: 01/04/2019 16:24:22 Arrival Time: 01/04/2019 16:04:00 Stamp Time: 01/04/2019 16:24:22 Time First Login Attempt: 01/04/2019 16:28:00 Video Start Time: 01/04/2019 16:28:00  Symptoms: right sided weakness NIHSS Start Assessment Time: 01/04/2019 16:28:00 Patient is not a candidate for tPA. Patient was not deemed candidate for tPA thrombolytics because of Out of the TPA window. On Coumadin. Video End Time: 01/04/2019 16:38:24  CT head was reviewed.  Clinical Presentation is Suggestive of Large Vessel Occlusive Disease, Recommendations are as Follows  CTA Head and Neck pending CT Perfusion pending   Radiologist was not called back for review of advanced imaging because imaging pending ED Physician notified of diagnostic impression and management plan on 01/04/2019 16:36:04  Our recommendations are outlined below.  Recommendations:     .  Activate Stroke Protocol Admission/Order Set     .  Stroke/Telemetry Floor     .  Neuro Checks     .  Bedside Swallow Eval     .  DVT Prophylaxis     .  IV Fluids, Normal Saline     .  Head of Bed Below 30 Degrees     .  Euglycemia and Avoid Hyperthermia (PRN Acetaminophen)     .  ASA   Sign Out:     .  Discussed with Emergency Department Provider    ------------------------------------------------------------------------------  History of Present Illness:  Patient was brought by private transportation with symptoms of right sided weakness  83 y/o man with h/o DM HTN, CAD and atrial fibrillation (on  Coumadin) who presents with right sided weakness. Patient has left AKA. CT head reviewed and case discussed with ED staff. Last known well at 1200 noon as per wife at bedside.  CT head was reviewed.   Examination: 1A: Level of Consciousness - Alert; keenly responsive + 0 1B: Ask Month and Age - Aphasic + 2 1C: Blink Eyes & Squeeze Hands - Performs Both Tasks + 0 2: Test Horizontal Extraocular Movements - Normal + 0 3: Test Visual Fields - No Visual Loss + 0 4: Test Facial Palsy (Use Grimace if Obtunded) - Minor paralysis (flat nasolabial fold, smile asymmetry) + 1 5A: Test Left Arm Motor Drift - No Drift for 10 Seconds + 0 5B: Test Right Arm Motor Drift - Drift, but doesn't hit bed + 1 6A: Test Left Leg Motor Drift - No Drift for 5 Seconds + 0 6B: Test Right Leg Motor Drift - Drift, but doesn't hit bed + 1 7: Test Limb Ataxia (FNF/Heel-Shin) - No Ataxia + 0 8: Test Sensation - No Response and Quadriplegic + 2 9: Test Language/Aphasia - Severe Aphasia: Fragmentary Expression, Inference Needed, Cannot Identify Materials + 2 10: Test Dysarthria - Mild-Moderate Dysarthria: Slurring but can be understood + 1 11: Test Extinction/Inattention - No abnormality + 0  NIHSS Score: 10  Patient's wife was informed the Neurology Consult would happen via TeleHealth consult by way of interactive audio and video telecommunications and consented to receiving care in this manner.  Due to the immediate potential for life-threatening deterioration due to underlying acute neurologic illness, I spent 15  minutes providing critical care. This time includes time for face to face visit via telemedicine, review of medical records, imaging studies and discussion of findings with providers, the patient and/or family.   Dr Meryl Crutch   [facility] [phone]   Case [id number]  Addendum 1710 - advanced neuroimagng reviewed and discussed with radiology and neuro-IR. Patient to transfer for left m1  thrombectomy. Case discussed with ED attedning

## 2019-01-04 NOTE — Sedation Documentation (Signed)
Bedside report given to Marcello Moores, RN, 4N. Left groin checked, level 0, dressing c/d/i. Patient remains stable at this time. Informed thomas, RN that I gave patients wife patients watch which was the only belonging found with patient. Also informed Marcello Moores, RN that family was directed to 4N waiting area.

## 2019-01-04 NOTE — Sedation Documentation (Signed)
Patients watch given to patient wife by this nurse. Family directed to 4N waiting area.

## 2019-01-04 NOTE — Consult Note (Signed)
NAME:  [name], MRN:  [id number], DOB:  [birth year], LOS: 0 ADMISSION DATE:  01/04/2019, CONSULTATION DATE:  3/14 REFERRING MD:  Cheral Marker MD, CHIEF COMPLAINT:  stroke  Brief History   83 year old man with history of atrial fibrillation on Coumadin, CAD status post CABG in 1990s, hypertension, DM 2, PVD, left AKA presenting with slurred speech, right-sided facial droop found to have a left MCA occlusion s/p left common carotid arteriogram followed by revascularization of left MCA M1 occlusion by VIR.  History of present illness   83 year old man with history of atrial fibrillation on Coumadin, CAD status post CABG in 1990s, hypertension, DM 2, squamous cell jaw cancer, PVD, left AKA presenting with acute encephalopathy, slurred speech, right-sided facial droop at noon today.  Code stroke was called.  Was transferred from [facility] to [facility].  Underwent left common carotid arteriogram followed by revascularization of left MCA M1 occlusion by VIR. History obtained from EMR due to patient sedated on mechanical ventilation. No family at bedside.  Past Medical History  atrial fibrillation on Coumadin, CAD status post CABG in 1990s, hypertension, DM 2, squamous cell jaw cancer, PVD, left AKA  Significant Hospital Events   3/14 Admit, VIR procedure  Consults:  VIR  Procedures:  3/14 left common carotid arteriogram followed by revascularization of left MCA M1 occlusion by VIR  Significant Diagnostic Tests:  AXR 3/14> Orogastric or nasogastric tube in place with its tip and side hole in the proximal stomach.  CTA head, neck 3/14> Positive for Left MCA M1 ELVO with favorable CT Perfusion  CT head w/o contrast 3/14> Small age indeterminate small lacunar infarct in the ventral left thalamus. No acute cortically based infarct identified. No acute intracranial hemorrhage, or mass effect.  Micro Data:  MRSA PCR>  Antimicrobials:  None  Interim history/subjective:  Intubated, sedated   Objective   Blood pressure (!) 111/56, pulse (!) 55, temperature 98.4 F (36.9 C), temperature source Oral, resp. rate 17, height 5' 8"  (1.727 m), weight 79.8 kg, SpO2 98 %.    Vent Mode: PRVC FiO2 (%):  [40 %] 40 % Set Rate:  [16 bmp] 16 bmp Vt Set:  [540 mL] 540 mL PEEP:  [5 cmH20] 5 cmH20 Plateau Pressure:  [16 cmH20] 16 cmH20  No intake or output data in the 24 hours ending 01/04/19 2212 Filed Weights   01/04/19 1618  Weight: 79.8 kg    Examination: General: NAD, sedated HENT: [place]/AT, ETT in place Lungs: Clear bilaterally with no wheezes, normal respiratory effort Cardiovascular: Irregular and bradycardic Abdomen: Soft, nondistended Extremities: L AKA, no LE edema, nonpalpable R DP pulse but dopplerable Neuro: Sedated with spontaneous movement of R UE  Assessment & Plan:  83 year old man with history of atrial fibrillation on Coumadin, CAD status post CABG in 1990s, hypertension, DM 2, PVD, left AKA presenting with slurred speech, right-sided facial droop found to have a left MCA occlusion s/p left common carotid arteriogram followed by revascularization of left MCA M1 occlusion by VIR.  Acute MCA CVA, L MCA occlusion s/p thrombectomy 3/14, HTN: Leukocytosis of 14, likely reactive. --Neurology primary --Follow-up UA and UDS --Follow-up lipid panel, hemoglobin A1c --Follow up TTE --BP goals per neurology with Cleviprex prn --Continue home amlodipine, losartan, HCTZ --PT/OT/SLP  Acute respiratory failure due to acute encephalopathy and need for airway protection:  --Continue vent support --Obtain CXR --SBT/SAT when able --Obtain VBG  Metabolic acidosis with normal anion gap, AKI on CKD 3: Bicarb 19 and anion gap of  10.  Creatinine 1.25.  Baseline is around 1.  Also his previous bicarb has been around 17-20. --Follow up renal ultrasound --Monitor renal function  Atrial fibrillation: On Coumadin.  INR subtherapeutic at 1.8 on admission. Hold Coumadin for now. --Hold  off on home nebivolol for now given HR intermittently decreasing to 40s.  DM: SSI and CBG monitoring  Chronic anemia: Hemoglobin 11.9.  Baseline around 10  Best practice:  Diet: NPO Pain/Anxiety/Delirium protocol (if indicated): fent prn VAP protocol (if indicated): Ordered DVT prophylaxis: SCD GI prophylaxis: PPI Glucose control: SSI Mobility: PT/OT Code Status: Full Family Communication: None at bedside. Neurology discussed with family per notes Disposition: ICU  Labs   CBC: Recent Labs  Lab 01/04/19 1619  WBC 14.0*  NEUTROABS 11.6*  HGB 11.9*  HCT 36.5*  MCV 94.3  PLT 034    Basic Metabolic Panel: Recent Labs  Lab 01/04/19 1619 01/04/19 1644  NA 138  --   K 3.9  --   CL 109  --   CO2 19*  --   GLUCOSE 162*  --   BUN 26*  --   CREATININE 1.25* 1.10  CALCIUM 8.6*  --    GFR: Estimated Creatinine Clearance: 51.8 mL/min (by C-G formula based on SCr of 1.1 mg/dL). Recent Labs  Lab 01/04/19 1619  WBC 14.0*    Liver Function Tests: Recent Labs  Lab 01/04/19 1619  AST 20  ALT 18  ALKPHOS 110  BILITOT 0.5  PROT 7.8  ALBUMIN 3.5   ABG    Component Value Date/Time   PHART 7.464 (H) 11/13/2015 0917   PCO2ART 34.9 (L) 11/13/2015 0917   PO2ART 57.0 (L) 11/13/2015 0917   HCO3 25.1 (H) 11/13/2015 0917   TCO2 14 (L) 02/13/2018 1125   ACIDBASEDEF 4.0 (H) 11/11/2015 0225   O2SAT 91.0 11/13/2015 0917     Coagulation Profile: Recent Labs  Lab 01/04/19 1619  INR 1.8*   HbA1C: Hgb A1c MFr Bld  Date/Time Value Ref Range Status  08/10/2016 08:42 AM 5.6 4.8 - 5.6 % Final    Comment:    (NOTE)         Pre-diabetes: 5.7 - 6.4         Diabetes: >6.4         Glycemic control for adults with diabetes: <7.0   11/08/2015 10:32 AM 5.7 (H) 4.8 - 5.6 % Final    Comment:    (NOTE)         Pre-diabetes: 5.7 - 6.4         Diabetes: >6.4         Glycemic control for adults with diabetes: <7.0     CBG: Recent Labs  Lab 01/04/19 1634 01/04/19 2056   GLUCAP 154* 126*    Review of Systems:   Unable to obtain due to mechanical ventilation  Past Medical History  He,  has a past medical history of Arteriosclerotic cardiovascular disease (ASCVD), Arthritis, Atrial fibrillation ([place]), Cellulitis (11/08/2015), Chronic anticoagulation, Dental crowns present, Diabetic peripheral neuropathy ([place]), History of blood transfusion, Hypertension, Iron deficiency anemia, Jaw cancer ([place]) (1990s), Myocardial infarction ([place]) (1990s), Pedal edema, Peripheral vascular disease ([place]), Presence of retained hardware (07/2015), Runny nose (07/27/2015), and Type II diabetes mellitus ([place]).   Surgical History    Past Surgical History:  Procedure Laterality Date  . AMPUTATION Left 11/11/2015   Procedure: LEFT ABOVE KNEE AMPUTATION;  Surgeon: Angelia Mould, MD;  Location: [place];  Service: Vascular;  Laterality: Left;  .  CARDIAC CATHETERIZATION  1990s; 04/16/2001  . CATARACT EXTRACTION W/ INTRAOCULAR LENS  IMPLANT, BILATERAL Bilateral 09/2015  . COLONOSCOPY N/A 11/27/2014   Procedure: COLONOSCOPY;  Surgeon: Rogene [place], MD;  [facility];  Service: Endoscopy;  Laterality: N/A;  830  . CORONARY ARTERY BYPASS GRAFT  1990's   "CABG X4"  . ESOPHAGOGASTRODUODENOSCOPY N/A 02/14/2018   Procedure: ESOPHAGOGASTRODUODENOSCOPY (EGD);  Surgeon: Rogene [place], MD;  [facility];  Service: Endoscopy;  Laterality: N/A;  . ESOPHAGOGASTRODUODENOSCOPY (EGD) WITH PROPOFOL N/A 02/25/2018   Procedure: ESOPHAGOGASTRODUODENOSCOPY (EGD) WITH PROPOFOL;  Surgeon: Rogene [place], MD;  [facility];  Service: Endoscopy;  Laterality: N/A;  . FRACTURE SURGERY    . GIVENS CAPSULE STUDY N/A 02/15/2018   Procedure: GIVENS CAPSULE STUDY;  Surgeon: Rogene [place], MD;  [facility];  Service: Endoscopy;  Laterality: N/A;  . GIVENS CAPSULE STUDY N/A 03/27/2018   Procedure: GIVENS CAPSULE STUDY;  Surgeon: Rogene [place], MD;  [facility];  Service: Endoscopy;  Laterality: N/A;  . I&D EXTREMITY Left 11/10/2015   Procedure: INCISION AND DRAINAGE LEFT FOOT, AMPUTATION OF LEFT THIRD TOE;  Surgeon: Conrad Benson, MD;  Location: [facility];  Service: Vascular;  Laterality: Left;  . INCISION AND DRAINAGE ABSCESS Left 06/16/2005   wide exc. abscess 5th toe  . IR ANGIOGRAM VISCERAL SELECTIVE  03/28/2018  . IR ANGIOGRAM VISCERAL SELECTIVE  03/28/2018  . IR US GUIDE VASC ACCESS LEFT  03/28/2018  . MANDIBULAR HARDWARE REMOVAL Left 08/02/2015   Procedure:  HARDWARE REMOVAL TWO MANDIBULAR SCREWS;  Surgeon: Jannette Fogo, DDS;  Location: [place];  Service: Oral Surgery;  Laterality: Left;  . ORIF TIBIA FRACTURE Left ~ 1970  . PERIPHERAL VASCULAR CATHETERIZATION N/A 08/14/2016   Procedure: Abdominal Aortogram w/Lower Extremity;  Surgeon: Angelia Mould, MD;  Location: [facility];  Service: Cardiovascular;  Laterality: N/A;  . SQUAMOUS CELL CARCINOMA EXCISION Left 1993   "took my jaw out; got cadavar in there now"  . TONSILLECTOMY       Social History   reports that he has never smoked. He has never used smokeless tobacco. He reports that he does not drink alcohol or use drugs.   Family History   His family history includes Heart disease in his mother.   Allergies Allergies  Allergen Reactions  . Lipitor [Atorvastatin] Other (See Comments)    SEVERE HEADACHE  . Simvastatin Other (See Comments)    MUSCLE ACHES  . Penicillins Rash    Has patient had a PCN reaction causing immediate rash, facial/tongue/throat swelling, SOB or lightheadedness with hypotension: Yes Has patient had a PCN reaction causing severe rash involving mucus membranes or skin necrosis: No Has patient had a PCN reaction that required hospitalization No Has patient had a PCN reaction occurring within the last 10 years: No If all of the above answers are "NO", then may proceed with Cephalosporin use.   . Sulfa Antibiotics Rash    Tolerates  silver sulfadiazine cream at home     Home Medications  Prior to Admission medications   Medication Sig Start Date End Date Taking? Authorizing Provider  amLODipine (NORVASC) 10 MG tablet Take 1 tablet (10 mg total) by mouth daily. 04/11/18  Yes Herminio Commons, MD  losartan-hydrochlorothiazide (HYZAAR) 100-12.5 MG tablet Take 1 tablet by mouth daily.   Yes [provider]  lovastatin (MEVACOR) 10 MG tablet Take 10 mg by mouth daily.  Yes [provider]  nebivolol (BYSTOLIC) 10 MG tablet Take 10 mg by mouth daily.   Yes [provider]  pantoprazole (PROTONIX) 40 MG tablet Take 1 tablet (40 mg total) by mouth daily. 02/15/18  Yes TatShanon Brow, MD  warfarin (COUMADIN) 1 MG tablet Take 2 tablets daily except 1 tablet on Mondays, Wednesdays and Fridays or as directed 10/21/18  Yes Herminio Commons, MD  cetirizine (ZYRTEC) 10 MG tablet Take 10 mg by mouth daily.    [provider]  feeding supplement (BOOST / RESOURCE BREEZE) LIQD Take 1 Container by mouth 3 (three) times daily between meals. Patient taking differently: Take 1 Container by mouth 2 (two) times daily.  11/16/15   Loleta Chance, MD  ferrous sulfate 325 (65 FE) MG tablet Take 65 mg by mouth daily with breakfast.    [provider]  methocarbamol (ROBAXIN) 500 MG tablet Take 1-2 tablets (500-1,000 mg total) by mouth every 6 (six) hours as needed for muscle spasms. 1/43/88   Delora Fuel, MD  Multiple Vitamins-Minerals (CENTRUM SILVER PO) Take 1 tablet by mouth daily.      [provider]  nystatin cream (MYCOSTATIN) Apply 1 application topically 3 (three) times daily.    [provider]  Omega-3 Fatty Acids (FISH OIL) 1200 MG CAPS Take 1,200 mg by mouth daily.    [provider]  Pediatric Multivitamins-Iron (FLINTSTONES PLUS IRON PO) Take 1 tablet by mouth daily.    [provider]  Probiotic Product ([place]) Take 1 capsule by mouth  daily.     [provider]  silver sulfADIAZINE (SILVADENE) 1 % cream Apply topically daily. Patient taking differently: Apply 1 application topically daily. Applied to affected area of foot. 11/16/15   Loleta Chance, MD  traMADol (ULTRAM) 50 MG tablet Take 1 tablet (50 mg total) by mouth every 6 (six) hours as needed. 8/75/79   Delora Fuel, MD  vitamin B-12 (CYANOCOBALAMIN) 1000 MCG tablet Take 2,000 mcg by mouth every morning.    [provider]     Critical care time: The patient is critically ill with multiple organ systems failure and requires high complexity decision making for assessment and support, frequent evaluation and titration of therapies, application of advanced monitoring technologies and extensive interpretation of multiple databases.   Critical Care Time devoted to patient care services described in this note is  35 Minutes. This time reflects time of care of this signee. This critical care time does not reflect procedure time, or teaching time or supervisory time of PA/NP/Med student/Med Resident etc but could involve care discussion time.  Jacques Earthly, M.D. [facility] Pulmonary/Critical Care Medicine After hours pager: [phone].

## 2019-01-04 NOTE — ED Notes (Signed)
[facility] called to transfer Pt to [facility].

## 2019-01-04 NOTE — Transfer of Care (Signed)
Immediate Anesthesia Transfer of Care Note  Patient: [name]  Procedure(s) Performed: CODE STROKE (N/A )  Patient Location: ICU  Anesthesia Type:General  Level of Consciousness: sedated and Patient remains intubated per anesthesia plan  Airway & Oxygen Therapy: Patient remains intubated per anesthesia plan and Patient placed on Ventilator (see vital sign flow sheet for setting)  Post-op Assessment: Report given to RN and Post -op Vital signs reviewed and stable  Post vital signs: Reviewed and stable  Last Vitals:  Vitals Value Taken Time  BP    Temp    Pulse 62 01/04/2019  7:50 PM  Resp 19 01/04/2019  7:50 PM  SpO2 98 % 01/04/2019  7:50 PM  Vitals shown include unvalidated device data.  Last Pain:  Vitals:   01/04/19 1618  TempSrc:   PainSc: 0-No pain         Complications: No apparent anesthesia complications

## 2019-01-04 NOTE — Code Documentation (Signed)
Assisted patient and wife to IR.  EMS handoff with IR staff.  Dr Estanislado Pandy and Dr Cheral Marker at bedside, both spoke with wife.  Additional family arrived assisted to wife's side. Plan admit 4N ICU

## 2019-01-04 NOTE — Anesthesia Preprocedure Evaluation (Signed)
Anesthesia Evaluation  Patient identified by MRN, date of birth, ID bandPreop documentation limited or incomplete due to emergent nature of procedure.  Airway Mallampati: II  TM Distance: >3 FB Neck ROM: Full    Dental  (+) Dental Advisory Given   Pulmonary neg pulmonary ROS,    breath sounds clear to auscultation       Cardiovascular hypertension, + CABG  + dysrhythmias Atrial Fibrillation  Rhythm:Regular Rate:Normal     Neuro/Psych CVA    GI/Hepatic   Endo/Other  diabetes, Type 2  Renal/GU      Musculoskeletal  (+) Arthritis ,   Abdominal   Peds  Hematology  (+) anemia ,   Anesthesia Other Findings   Reproductive/Obstetrics                             Lab Results  Component Value Date   WBC 14.0 (H) 01/04/2019   HGB 11.9 (L) 01/04/2019   HCT 36.5 (L) 01/04/2019   MCV 94.3 01/04/2019   PLT 369 01/04/2019   Lab Results  Component Value Date   CREATININE 1.10 01/04/2019   BUN 26 (H) 01/04/2019   NA 138 01/04/2019   K 3.9 01/04/2019   CL 109 01/04/2019   CO2 19 (L) 01/04/2019    Anesthesia Physical Anesthesia Plan  ASA: IV and emergent  Anesthesia Plan: General   Post-op Pain Management:    Induction: Intravenous  PONV Risk Score and Plan: 2 and Dexamethasone, Ondansetron and Treatment may vary due to age or medical condition  Airway Management Planned: Oral ETT  Additional Equipment: Arterial line  Intra-op Plan:   Post-operative Plan: Possible Post-op intubation/ventilation  Informed Consent: I have reviewed the patients History and Physical, chart, labs and discussed the procedure including the risks, benefits and alternatives for the proposed anesthesia with the patient or authorized representative who has indicated his/her understanding and acceptance.     Dental advisory given  Plan Discussed with: CRNA  Anesthesia Plan Comments:         Anesthesia  Quick Evaluation

## 2019-01-04 NOTE — Procedures (Signed)
S/P :Lt common carotid arteriogram fiollowed by complete revascularization of Lt MCA M 1 occlusion with x 1 pass with 60m x 33 mm embotrap retriver device achieving a TICI revascularization

## 2019-01-04 NOTE — Sedation Documentation (Signed)
Patient transported to 4N26 with nurse and CRNA. Patient stable at transport.

## 2019-01-04 NOTE — Progress Notes (Signed)
Patient ID: [name], male   DOB: 1936/07/26, 83 y.o.   MRN: [id number] INR. 49 Y RT H M LSW 12NOON.New onset expressive aphasia and RT sided weakness.On coumadin for afib MRSS 0 . CT Brain NO ICH ASPECTS 10 . CTA Occluded Lt MCA M1. CTP no core though a penumbra of 95 ml in the Lt MCA distribution. Option of endovascular revascularization D/W spouse and son, Reasons ,risks,alternatives all reviewed. Risks of ICH of 10 % ,worsening neuro deficit,vent dependency death ,iability to revascularize , vascular injury reviewed. They expressed understanding and gave witnessed consent to proceed. S.Shevelle Smither MD

## 2019-01-04 NOTE — Anesthesia Procedure Notes (Signed)
Arterial Line Insertion Start/End3/14/2020 6:09 PM, 01/04/2019 6:11 PM Performed by: Suzy Bouchard, CRNA, CRNA  Preanesthetic checklist: patient identified, IV checked, site marked, risks and benefits discussed, surgical consent, monitors and equipment checked, pre-op evaluation, timeout performed and anesthesia consent Lidocaine 1% used for infiltration Left, radial was placed Catheter size: 20 G Hand hygiene performed  and maximum sterile barriers used  Allen's test indicative of satisfactory collateral circulation Attempts: 1 Procedure performed without using ultrasound guided technique. Following insertion, Biopatch and dressing applied. Post procedure assessment: normal

## 2019-01-04 NOTE — ED Provider Notes (Signed)
[facility] EMERGENCY DEPARTMENT Provider Note   CSN: [id number] Arrival date & time: 01/04/19  2836  An emergency department physician performed an initial assessment on this suspected stroke patient at 11.  History   Chief Complaint Chief Complaint  Patient presents with   Code Stroke    HPI [name] is a 83 y.o. male.    Level 5 caveat due to altered mental status. HPI Patient brought in for weakness and mental status changes.  On Coumadin for atrial fibrillation.  Has had difficulty speaking.  Last normal at the latest was around noon but may have actually been before that.  Has a left above-the-knee amputation has somewhat difficulty getting around.  Reportedly had a little more trouble in the shower than normal.  However patient now really cannot identify his wife.  Will follow some commands that he will raise both hands but weaker on right compared to left.  Some right face droop.  Code stroke called.  Tele-neurology activated. Past Medical History:  Diagnosis Date   Arteriosclerotic cardiovascular disease (ASCVD)    CABG in 1990s; 2002 total obstruction of LAD, CX and RCA with patent grafts and nl EF; Stress nuc. 2008 - mild LV dilation; normal EF; questionable small anteroapical scar; no ischemia   Arthritis    "left knee" (11/08/2015)   Atrial fibrillation ([place])    Cellulitis 11/08/2015   "both feet"   Chronic anticoagulation    Dental crowns present    Diabetic peripheral neuropathy ([place])    bilateral lower leg, hands   History of blood transfusion    "related to my leg surgery?"   Hypertension    states under control with meds., has been on med. x "long time"   Iron deficiency anemia    Jaw cancer ([place]) 1990s   "squamous cell"   Myocardial infarction ([place]) 1990s   "after my jaw OR"   Pedal edema    "not a lot", per pt.   Peripheral vascular disease ([place])    Presence of retained hardware 07/2015   failed hardware jaw   Runny nose  07/27/2015   clear drainage, per pt.   Type II diabetes mellitus ([place])    "diet controlled" (11/08/2015)    Patient Active Problem List   Diagnosis Date Noted   Gastrointestinal hemorrhage    AKI (acute kidney injury) ([address])    Anemia 03/26/2018   Melena 02/21/2018   Upper GI bleed 02/13/2018   Coagulopathy ([place]) 02/13/2018   Acute renal failure superimposed on stage 3 chronic kidney disease ([place]) 02/13/2018   Weight gain 11/30/2015   Coronary artery disease involving coronary bypass graft of native heart without angina pectoris    Tachycardia    Leukocytosis    Abnormality of gait    Status post above knee amputation of left lower extremity    Acute blood loss anemia    Acute pulmonary edema (HCC)    Acute congestive heart failure (HCC)    Acute respiratory failure with hypoxia ([place])    Pressure ulcer 11/09/2015   Necrotic toes ([place]) 11/08/2015   Lower extremity cellulitis 11/08/2015   Bradycardia 05/28/2015   PVD (peripheral vascular disease) with claudication ([place]) 09/30/2014   Encounter for therapeutic drug monitoring 11/26/2013   Peripheral vascular disease ([place]) 08/06/2012   Chronic anticoagulation 01/20/2011   Diabetes mellitus ([place]) 03/04/2010   Dyslipidemia 03/04/2010   Essential hypertension 03/04/2010   Hx of CABG 03/04/2010   Permanent atrial fibrillation 04/08/2009    Past Surgical  History:  Procedure Laterality Date   AMPUTATION Left 11/11/2015   Procedure: LEFT ABOVE KNEE AMPUTATION;  Surgeon: Angelia Mould, MD;  Location: [place];  Service: Vascular;  Laterality: Left;   CARDIAC CATHETERIZATION  1990s; 04/16/2001   CATARACT EXTRACTION W/ INTRAOCULAR LENS  IMPLANT, BILATERAL Bilateral 09/2015   COLONOSCOPY N/A 11/27/2014   Procedure: COLONOSCOPY;  Surgeon: Rogene [place], MD;  [facility];  Service: Endoscopy;  Laterality: N/A;  830   CORONARY ARTERY BYPASS GRAFT  1990's   "CABG X4"    ESOPHAGOGASTRODUODENOSCOPY N/A 02/14/2018   Procedure: ESOPHAGOGASTRODUODENOSCOPY (EGD);  Surgeon: Rogene Houston, MD;  [facility];  Service: Endoscopy;  Laterality: N/A;   ESOPHAGOGASTRODUODENOSCOPY (EGD) WITH PROPOFOL N/A 02/25/2018   Procedure: ESOPHAGOGASTRODUODENOSCOPY (EGD) WITH PROPOFOL;  Surgeon: Rogene Houston, MD;  [facility];  Service: Endoscopy;  Laterality: N/A;   FRACTURE SURGERY     GIVENS CAPSULE STUDY N/A 02/15/2018   Procedure: GIVENS CAPSULE STUDY;  Surgeon: Rogene Houston, MD;  [facility];  Service: Endoscopy;  Laterality: N/A;   GIVENS CAPSULE STUDY N/A 03/27/2018   Procedure: GIVENS CAPSULE STUDY;  Surgeon: Rogene Houston, MD;  [facility];  Service: Endoscopy;  Laterality: N/A;   I&D EXTREMITY Left 11/10/2015   Procedure: INCISION AND DRAINAGE LEFT FOOT, AMPUTATION OF LEFT THIRD TOE;  Surgeon: Conrad [place], MD;  Location: [place];  Service: Vascular;  Laterality: Left;   INCISION AND DRAINAGE ABSCESS Left 06/16/2005   wide exc. abscess 5th toe   IR ANGIOGRAM VISCERAL SELECTIVE  03/28/2018   IR ANGIOGRAM VISCERAL SELECTIVE  03/28/2018   IR US GUIDE VASC ACCESS LEFT  03/28/2018   MANDIBULAR HARDWARE REMOVAL Left 08/02/2015   Procedure:  HARDWARE REMOVAL TWO MANDIBULAR SCREWS;  Surgeon: Jannette Fogo, DDS;  Location: [place];  Service: Oral Surgery;  Laterality: Left;   ORIF TIBIA FRACTURE Left ~ [place] N/A 08/14/2016   Procedure: Abdominal Aortogram w/Lower Extremity;  Surgeon: Angelia Mould, MD;  Location: [facility];  Service: Cardiovascular;  Laterality: N/A;   SQUAMOUS CELL CARCINOMA EXCISION Left 1993   "took my jaw out; got cadavar in there now"   TONSILLECTOMY          Home Medications    Prior to Admission medications   Medication Sig Start Date End Date Taking? Authorizing Provider  losartan-hydrochlorothiazide (HYZAAR) 100-12.5 MG  tablet Take 1 tablet by mouth daily.   Yes [provider]  lovastatin (MEVACOR) 10 MG tablet Take 10 mg by mouth daily.    Yes [provider]  warfarin (COUMADIN) 1 MG tablet Take 2 tablets daily except 1 tablet on Mondays, Wednesdays and Fridays or as directed 10/21/18  Yes Herminio Commons, MD  amLODipine (NORVASC) 10 MG tablet Take 1 tablet (10 mg total) by mouth daily. 04/11/18   Herminio Commons, MD  cetirizine (ZYRTEC) 10 MG tablet Take 10 mg by mouth daily.    [provider]  feeding supplement (BOOST / RESOURCE BREEZE) LIQD Take 1 Container by mouth 3 (three) times daily between meals. Patient taking differently: Take 1 Container by mouth 2 (two) times daily.  11/16/15   Loleta Chance, MD  ferrous sulfate 325 (65 FE) MG tablet Take 65 mg by mouth daily with breakfast.    [provider]  methocarbamol (ROBAXIN) 500 MG tablet Take 1-2 tablets (500-1,000 mg total) by mouth every  6 (six) hours as needed for muscle spasms. 1/50/56   Delora Fuel, MD  Multiple Vitamins-Minerals (CENTRUM SILVER PO) Take 1 tablet by mouth daily.      [provider]  nebivolol (BYSTOLIC) 10 MG tablet Take 10 mg by mouth daily.    [provider]  nystatin cream (MYCOSTATIN) Apply 1 application topically 3 (three) times daily.    [provider]  Omega-3 Fatty Acids (FISH OIL) 1200 MG CAPS Take 1,200 mg by mouth daily.    [provider]  pantoprazole (PROTONIX) 40 MG tablet Take 1 tablet (40 mg total) by mouth daily. 02/15/18   Orson Eva, MD  Pediatric Multivitamins-Iron (FLINTSTONES PLUS IRON PO) Take 1 tablet by mouth daily.    [provider]  Probiotic Product ([place]) Take 1 capsule by mouth daily.     [provider]  silver sulfADIAZINE (SILVADENE) 1 % cream Apply topically daily. Patient taking differently: Apply 1 application topically daily. Applied to affected area of foot. 11/16/15    Loleta Chance, MD  traMADol (ULTRAM) 50 MG tablet Take 1 tablet (50 mg total) by mouth every 6 (six) hours as needed. 9/79/48   Delora Fuel, MD  vitamin B-12 (CYANOCOBALAMIN) 1000 MCG tablet Take 2,000 mcg by mouth every morning.    [provider]    Family History Family History  Problem Relation Age of Onset   Heart disease Mother     Social History Social History   Tobacco Use   Smoking status: Never Smoker   Smokeless tobacco: Never Used  Substance Use Topics   Alcohol use: No    Alcohol/week: 0.0 standard drinks   Drug use: No     Allergies   Lipitor [atorvastatin]; Simvastatin; Penicillins; and Sulfa antibiotics   Review of Systems Review of Systems   Physical Exam Updated Vital Signs BP (!) 130/53    Pulse 63    Temp 98.4 F (36.9 C) (Oral)    Resp 20    Ht 5' 8"  (1.727 m)    Wt 79.8 kg    SpO2 100%    BMI 26.76 kg/m   Physical Exam Vitals signs and nursing note reviewed.  HENT:     Head:     Comments: Right facial droop.    Nose: Nose normal.  Eyes:     Pupils: Pupils are equal, round, and reactive to light.  Neck:     Musculoskeletal: Neck supple.  Cardiovascular:     Rate and Rhythm: Normal rate.  Pulmonary:     Breath sounds: No wheezing or rhonchi.  Abdominal:     Tenderness: There is no abdominal tenderness.  Musculoskeletal:     Comments: Left above-the-knee amputation.  Skin:    General: Skin is warm.     Capillary Refill: Capillary refill takes less than 2 seconds.  Neurological:     Mental Status: He is alert.     Comments: Right-sided facial droop.  Eye movements grossly intact.  Able to raise both upper extremities up but has drift on right side.  Able to raise right leg some to but no contralateral lower extremity to lift.  Patient is able to follow commands but is not able to name a pen.  Also not able to name his wife.   More complete NIH scoring done by Dr. Gaye Pollack   ED Treatments / Results  Labs (all labs ordered  are listed, but only abnormal results are displayed) Labs Reviewed  PROTIME-INR -  Abnormal; Notable for the following components:      Result Value   Prothrombin Time 21.0 (*)    INR 1.8 (*)    All other components within normal limits  CBC - Abnormal; Notable for the following components:   WBC 14.0 (*)    RBC 3.87 (*)    Hemoglobin 11.9 (*)    HCT 36.5 (*)    All other components within normal limits  DIFFERENTIAL - Abnormal; Notable for the following components:   Neutro Abs 11.6 (*)    Abs Immature Granulocytes 0.09 (*)    All other components within normal limits  COMPREHENSIVE METABOLIC PANEL - Abnormal; Notable for the following components:   CO2 19 (*)    Glucose, Bld 162 (*)    BUN 26 (*)    Creatinine, Ser 1.25 (*)    Calcium 8.6 (*)    GFR calc non Af Amer 54 (*)    All other components within normal limits  CBG MONITORING, ED - Abnormal; Notable for the following components:   Glucose-Capillary 154 (*)    All other components within normal limits  ETHANOL  APTT  RAPID URINE DRUG SCREEN, HOSP PERFORMED  URINALYSIS, ROUTINE W REFLEX MICROSCOPIC  I-STAT CREATININE, ED    EKG EKG Interpretation  Date/Time:  Saturday January 04 2019 16:33:07 EDT Ventricular Rate:  65 PR Interval:    QRS Duration: 130 QT Interval:  438 QTC Calculation: 456 R Axis:   -21 Text Interpretation:  Atrial fibrillation Nonspecific intraventricular conduction delay Anteroseptal infarct, old Borderline repolarization abnormality Baseline wander in lead(s) V3 Confirmed by Davonna Belling [phone]) on 01/04/2019 5:06:38 PM   Radiology Ct Angio Head W Or Wo Contrast  Result Date: 01/04/2019 CLINICAL DATA:  84 year old male with aphasia and right facial droop. EXAM: CT ANGIOGRAPHY HEAD AND NECK CT PERFUSION BRAIN TECHNIQUE: Multidetector CT imaging of the head and neck was performed using the standard protocol during bolus administration of intravenous contrast. Multiplanar CT image  reconstructions and MIPs were obtained to evaluate the vascular anatomy. Carotid stenosis measurements (when applicable) are obtained utilizing NASCET criteria, using the distal internal carotid diameter as the denominator. Multiphase CT imaging of the brain was performed following IV bolus contrast injection. Subsequent parametric perfusion maps were calculated using RAPID software. CONTRAST:  167m OMNIPAQUE IOHEXOL 350 MG/ML SOLN COMPARISON:  Head CT without contrast 1620 hours today. FINDINGS: CT Brain Perfusion Findings: CBF (<30%) Volume: 0 milliliters (up to 15 milliliters using CBF less than 38% criteria) ASPECTS 10 at 1620 hours today. Perfusion (Tmax>6.0s) volume: 95 milliliters, with hypoperfusion index of 0.2 (favorable) Mismatch Volume: 95 milliliters Infarction Location:Little if any core infarct, penumbra confined to the left MCA territory. CTA NECK Skeleton: Chronic deformity with postoperative changes to the mandible. Prior sternotomy. Mild for age cervical spine degeneration. No acute osseous abnormality identified. Upper chest: Prior CABG. Other neck: Mild left thyroid goiter. Atrophied left submandibular gland. No neck mass or lymphadenopathy. Aortic arch: Mild Calcified aortic atherosclerosis. 3 vessel arch configuration. Right carotid system: No stenosis.  Minor atherosclerosis. Left carotid system: No left CCA origin stenosis. Soft plaque in the distal left CCA and at the left ICA origin (low-density on series 4, image 81) not resulting in hemodynamically significant stenosis. Some superimposed left ICA bulb calcified plaque. Vertebral arteries: No proximal right subclavian artery stenosis despite plaque. Normal right vertebral artery origin. Patent right vertebral artery to the skull base without stenosis. No proximal left subclavian artery stenosis despite some plaque. Mild plaque  at the left vertebral artery origin with mild if any stenosis. Patent left vertebral artery to the skull base  without additional stenosis. CTA HEAD Posterior circulation: Mildly dominant left vertebral artery. Patent PICA origins. No distal vertebral stenosis. Patent vertebrobasilar junction and basilar artery without stenosis. SCA and left PCA origins are normal. Fetal type right PCA origin. Left posterior communicating artery diminutive or absent. Left PCA branches are within normal limits. Right PCA branches are within normal limits. Anterior circulation: Both ICA siphons are patent. On the left there is mild calcified plaque without stenosis. Similar calcified plaque on the right without stenosis. Normal right posterior communicating artery origin, the vessel itself is tortuous. Patent carotid termini, MCA and ACA origins. Anterior communicating artery and bilateral ACA branches are within normal limits. Right MCA M1, bifurcation, and right MCA branches are within normal limits. The left MCA M1 is thrombosed 8 or 10 millimeters from its origin. There is some could reconstitution of the left MCA branches (series 10, image 20). Venous sinuses: Patent. Anatomic variants: Mildly dominant left vertebral artery. Fetal type right PCA origin. Review of the MIP images confirms the above findings IMPRESSION: 1. Positive for Left MCA M1 ELVO with favorable CT Perfusion parameters: - estimated core infarct 0-15 mL and Left MCA penumbra of at least 95 mL. HPI 0.2. - this was discussed by telephone with tele-neurology Dr. Meryl Crutch on 01/04/2019 at 1706 hours. 2. Other vascular findings include soft plaque at the left ICA origin. Atherosclerosis but no hemodynamically significant stenosis elsewhere in the head or neck. Electronically Signed   By: Genevie Ann M.D.   On: 01/04/2019 17:10   Ct Angio Neck W Or Wo Contrast  Result Date: 01/04/2019 CLINICAL DATA:  83 year old male with aphasia and right facial droop. EXAM: CT ANGIOGRAPHY HEAD AND NECK CT PERFUSION BRAIN TECHNIQUE: Multidetector CT imaging of the head and neck was  performed using the standard protocol during bolus administration of intravenous contrast. Multiplanar CT image reconstructions and MIPs were obtained to evaluate the vascular anatomy. Carotid stenosis measurements (when applicable) are obtained utilizing NASCET criteria, using the distal internal carotid diameter as the denominator. Multiphase CT imaging of the brain was performed following IV bolus contrast injection. Subsequent parametric perfusion maps were calculated using RAPID software. CONTRAST:  125m OMNIPAQUE IOHEXOL 350 MG/ML SOLN COMPARISON:  Head CT without contrast 1620 hours today. FINDINGS: CT Brain Perfusion Findings: CBF (<30%) Volume: 0 milliliters (up to 15 milliliters using CBF less than 38% criteria) ASPECTS 10 at 1620 hours today. Perfusion (Tmax>6.0s) volume: 95 milliliters, with hypoperfusion index of 0.2 (favorable) Mismatch Volume: 95 milliliters Infarction Location:Little if any core infarct, penumbra confined to the left MCA territory. CTA NECK Skeleton: Chronic deformity with postoperative changes to the mandible. Prior sternotomy. Mild for age cervical spine degeneration. No acute osseous abnormality identified. Upper chest: Prior CABG. Other neck: Mild left thyroid goiter. Atrophied left submandibular gland. No neck mass or lymphadenopathy. Aortic arch: Mild Calcified aortic atherosclerosis. 3 vessel arch configuration. Right carotid system: No stenosis.  Minor atherosclerosis. Left carotid system: No left CCA origin stenosis. Soft plaque in the distal left CCA and at the left ICA origin (low-density on series 4, image 81) not resulting in hemodynamically significant stenosis. Some superimposed left ICA bulb calcified plaque. Vertebral arteries: No proximal right subclavian artery stenosis despite plaque. Normal right vertebral artery origin. Patent right vertebral artery to the skull base without stenosis. No proximal left subclavian artery stenosis despite some plaque. Mild plaque  at  the left vertebral artery origin with mild if any stenosis. Patent left vertebral artery to the skull base without additional stenosis. CTA HEAD Posterior circulation: Mildly dominant left vertebral artery. Patent PICA origins. No distal vertebral stenosis. Patent vertebrobasilar junction and basilar artery without stenosis. SCA and left PCA origins are normal. Fetal type right PCA origin. Left posterior communicating artery diminutive or absent. Left PCA branches are within normal limits. Right PCA branches are within normal limits. Anterior circulation: Both ICA siphons are patent. On the left there is mild calcified plaque without stenosis. Similar calcified plaque on the right without stenosis. Normal right posterior communicating artery origin, the vessel itself is tortuous. Patent carotid termini, MCA and ACA origins. Anterior communicating artery and bilateral ACA branches are within normal limits. Right MCA M1, bifurcation, and right MCA branches are within normal limits. The left MCA M1 is thrombosed 8 or 10 millimeters from its origin. There is some could reconstitution of the left MCA branches (series 10, image 20). Venous sinuses: Patent. Anatomic variants: Mildly dominant left vertebral artery. Fetal type right PCA origin. Review of the MIP images confirms the above findings IMPRESSION: 1. Positive for Left MCA M1 ELVO with favorable CT Perfusion parameters: - estimated core infarct 0-15 mL and Left MCA penumbra of at least 95 mL. HPI 0.2. - this was discussed by telephone with tele-neurology Dr. Meryl Crutch on 01/04/2019 at 1706 hours. 2. Other vascular findings include soft plaque at the left ICA origin. Atherosclerosis but no hemodynamically significant stenosis elsewhere in the head or neck. Electronically Signed   By: Genevie Ann M.D.   On: 01/04/2019 17:10   Ct Cerebral Perfusion W Contrast  Result Date: 01/04/2019 CLINICAL DATA:  83 year old male with aphasia and right facial droop. EXAM: CT  ANGIOGRAPHY HEAD AND NECK CT PERFUSION BRAIN TECHNIQUE: Multidetector CT imaging of the head and neck was performed using the standard protocol during bolus administration of intravenous contrast. Multiplanar CT image reconstructions and MIPs were obtained to evaluate the vascular anatomy. Carotid stenosis measurements (when applicable) are obtained utilizing NASCET criteria, using the distal internal carotid diameter as the denominator. Multiphase CT imaging of the brain was performed following IV bolus contrast injection. Subsequent parametric perfusion maps were calculated using RAPID software. CONTRAST:  136m OMNIPAQUE IOHEXOL 350 MG/ML SOLN COMPARISON:  Head CT without contrast 1620 hours today. FINDINGS: CT Brain Perfusion Findings: CBF (<30%) Volume: 0 milliliters (up to 15 milliliters using CBF less than 38% criteria) ASPECTS 10 at 1620 hours today. Perfusion (Tmax>6.0s) volume: 95 milliliters, with hypoperfusion index of 0.2 (favorable) Mismatch Volume: 95 milliliters Infarction Location:Little if any core infarct, penumbra confined to the left MCA territory. CTA NECK Skeleton: Chronic deformity with postoperative changes to the mandible. Prior sternotomy. Mild for age cervical spine degeneration. No acute osseous abnormality identified. Upper chest: Prior CABG. Other neck: Mild left thyroid goiter. Atrophied left submandibular gland. No neck mass or lymphadenopathy. Aortic arch: Mild Calcified aortic atherosclerosis. 3 vessel arch configuration. Right carotid system: No stenosis.  Minor atherosclerosis. Left carotid system: No left CCA origin stenosis. Soft plaque in the distal left CCA and at the left ICA origin (low-density on series 4, image 81) not resulting in hemodynamically significant stenosis. Some superimposed left ICA bulb calcified plaque. Vertebral arteries: No proximal right subclavian artery stenosis despite plaque. Normal right vertebral artery origin. Patent right vertebral artery to the  skull base without stenosis. No proximal left subclavian artery stenosis despite some plaque. Mild plaque at the left vertebral artery  origin with mild if any stenosis. Patent left vertebral artery to the skull base without additional stenosis. CTA HEAD Posterior circulation: Mildly dominant left vertebral artery. Patent PICA origins. No distal vertebral stenosis. Patent vertebrobasilar junction and basilar artery without stenosis. SCA and left PCA origins are normal. Fetal type right PCA origin. Left posterior communicating artery diminutive or absent. Left PCA branches are within normal limits. Right PCA branches are within normal limits. Anterior circulation: Both ICA siphons are patent. On the left there is mild calcified plaque without stenosis. Similar calcified plaque on the right without stenosis. Normal right posterior communicating artery origin, the vessel itself is tortuous. Patent carotid termini, MCA and ACA origins. Anterior communicating artery and bilateral ACA branches are within normal limits. Right MCA M1, bifurcation, and right MCA branches are within normal limits. The left MCA M1 is thrombosed 8 or 10 millimeters from its origin. There is some could reconstitution of the left MCA branches (series 10, image 20). Venous sinuses: Patent. Anatomic variants: Mildly dominant left vertebral artery. Fetal type right PCA origin. Review of the MIP images confirms the above findings IMPRESSION: 1. Positive for Left MCA M1 ELVO with favorable CT Perfusion parameters: - estimated core infarct 0-15 mL and Left MCA penumbra of at least 95 mL. HPI 0.2. - this was discussed by telephone with tele-neurology Dr. Meryl Crutch on 01/04/2019 at 1706 hours. 2. Other vascular findings include soft plaque at the left ICA origin. Atherosclerosis but no hemodynamically significant stenosis elsewhere in the head or neck. Electronically Signed   By: Genevie Ann M.D.   On: 01/04/2019 17:10   Ct Head Code Stroke Wo  Contrast  Addendum Date: 01/04/2019   ADDENDUM REPORT: 01/04/2019 16:44 ADDENDUM: Study discussed by telephone with Dr. Davonna Belling on 01/04/2019 at 1633 hours. Electronically Signed   By: Genevie Ann M.D.   On: 01/04/2019 16:44   Result Date: 01/04/2019 CLINICAL DATA:  Code stroke. 83 year old male with slurred speech at noon today. Weakness. Right facial droop. EXAM: CT HEAD WITHOUT CONTRAST TECHNIQUE: Contiguous axial images were obtained from the base of the skull through the vertex without intravenous contrast. COMPARISON:  None. FINDINGS: Brain: Cerebral volume is within normal limits for age. No ventriculomegaly. No acute intracranial hemorrhage identified. No midline shift, mass effect, or evidence of intracranial mass lesion. Small age indeterminate hypodensity in the ventral left thalamus on series 2, image 15. Largely normal gray-white matter differentiation throughout the brain otherwise. No cortically based acute infarct identified. Vascular: Calcified atherosclerosis at the skull base. No suspicious intracranial vascular hyperdensity. Skull: Negative. Sinuses/Orbits: Visualized paranasal sinuses and mastoids are clear. Other: No acute orbit or scalp soft tissue finding. ASPECTS [facility] Stroke Program Early CT Score) - Ganglionic level infarction (caudate, lentiform nuclei, internal capsule, insula, M1-M3 cortex): 7 - Supraganglionic infarction (M4-M6 cortex): 3 Total score (0-10 with 10 being normal): 10 IMPRESSION: 1. Small age indeterminate small lacunar infarct in the ventral left thalamus. 2. No acute cortically based infarct identified. No acute intracranial hemorrhage, or mass effect. 3. ASPECTS is 10. Electronically Signed: By: Genevie Ann M.D. On: 01/04/2019 16:32    Procedures Procedures (including critical care time)  Medications Ordered in ED Medications  iohexol (OMNIPAQUE) 350 MG/ML injection 100 mL (100 mLs Intravenous Contrast Given 01/04/19 1645)     Initial Impression /  Assessment and Plan / ED Course  I have reviewed the triage vital signs and the nursing notes.  Pertinent labs & imaging results that were available during my  care of the patient were reviewed by me and considered in my medical decision making (see chart for details).        Patient presented with slurred speech and some weakness.  Normal may have been around noon but somewhat difficult to get history with that.  States he had been a little more weak in the shower.  He is on Coumadin with INR 1.8.  CT scan showed possible stroke.  CTA and perfusion done and showed lesion potentially amenable to interventional radiology.  Discussed with neurology both by tele-neurology and Dr. Cheral Marker.  Will transfer emergency to [facility] by EMS.  Final Clinical Impressions(s) / ED Diagnoses   Final diagnoses:  Cerebrovascular accident (CVA), unspecified mechanism [facility])    ED Discharge Orders    None       Davonna Belling, MD 01/04/19 [phone]

## 2019-01-04 NOTE — Progress Notes (Signed)
Code Stroke Times 1612 Call Time  5702 IYUWCN 1675 Exam Started  6125 Exam ended  4832 Images sent, Exam Completed, GR called

## 2019-01-04 NOTE — Sedation Documentation (Signed)
Patient has foley catheter place arriving to IR. Dr. Estanislado Pandy made aware.

## 2019-01-04 NOTE — Progress Notes (Signed)
Spoke with the patient's wife, Jerrett Baldinger, by telephone. Benefits and risks of VIR preliminarily discusses with her, including 10% risk of intracerebral hemorrhage, possible inability to extubate, possible anesthesia complications, versus approximately 50% chance of significant neurological improvement from clot retrieval procedure. The patient's wife verbalized consent to proceed with VIR.   Electronically signed: Dr. Kerney Elbe

## 2019-01-04 NOTE — Anesthesia Postprocedure Evaluation (Signed)
Anesthesia Post Note  Patient: [name]  Procedure(s) Performed: CODE STROKE (N/A )     Patient location during evaluation: SICU Anesthesia Type: General Level of consciousness: sedated Pain management: pain level controlled Vital Signs Assessment: post-procedure vital signs reviewed and stable Respiratory status: patient remains intubated per anesthesia plan Cardiovascular status: stable Postop Assessment: no apparent nausea or vomiting Anesthetic complications: no    Last Vitals:  Vitals:   01/04/19 2000 01/04/19 2005  BP: 138/70   Pulse: 73 (!) 57  Resp: 17 10  Temp:    SpO2: 100% 100%    Last Pain:  Vitals:   01/04/19 1618  TempSrc:   PainSc: 0-No pain                 Tiajuana Amass

## 2019-01-05 ENCOUNTER — Inpatient Hospital Stay (HOSPITAL_COMMUNITY): Payer: Medicare Other

## 2019-01-05 DIAGNOSIS — I351 Nonrheumatic aortic (valve) insufficiency: Secondary | ICD-10-CM

## 2019-01-05 DIAGNOSIS — I340 Nonrheumatic mitral (valve) insufficiency: Secondary | ICD-10-CM

## 2019-01-05 DIAGNOSIS — J9601 Acute respiratory failure with hypoxia: Secondary | ICD-10-CM

## 2019-01-05 DIAGNOSIS — I361 Nonrheumatic tricuspid (valve) insufficiency: Secondary | ICD-10-CM

## 2019-01-05 LAB — LIPID PANEL
CHOL/HDL RATIO: 3 ratio
Cholesterol: 90 mg/dL (ref 0–200)
HDL: 30 mg/dL — ABNORMAL LOW (ref >40)
LDL Cholesterol: 44 mg/dL (ref 0–99)
Triglycerides: 80 mg/dL (ref <150)
VLDL: 16 mg/dL (ref 0–40)

## 2019-01-05 LAB — ECHOCARDIOGRAM COMPLETE
HEIGHTINCHES: 68 in
Weight: 2816 oz

## 2019-01-05 LAB — CBC WITH DIFFERENTIAL/PLATELET
Abs Immature Granulocytes: 0.09 10*3/uL — ABNORMAL HIGH (ref 0.00–0.07)
BASOS PCT: 0 %
Basophils Absolute: 0 10*3/uL (ref 0.0–0.1)
Eosinophils Absolute: 0 10*3/uL (ref 0.0–0.5)
Eosinophils Relative: 0 %
HCT: 32.1 % — ABNORMAL LOW (ref 39.0–52.0)
Hemoglobin: 10.2 g/dL — ABNORMAL LOW (ref 13.0–17.0)
Immature Granulocytes: 1 %
Lymphocytes Relative: 5 %
Lymphs Abs: 0.7 10*3/uL (ref 0.7–4.0)
MCH: 29.7 pg (ref 26.0–34.0)
MCHC: 31.8 g/dL (ref 30.0–36.0)
MCV: 93.3 fL (ref 80.0–100.0)
MONOS PCT: 2 %
Monocytes Absolute: 0.2 10*3/uL (ref 0.1–1.0)
Neutro Abs: 13.2 10*3/uL — ABNORMAL HIGH (ref 1.7–7.7)
Neutrophils Relative %: 92 %
Platelets: 359 10*3/uL (ref 150–400)
RBC: 3.44 MIL/uL — ABNORMAL LOW (ref 4.22–5.81)
RDW: 14.6 % (ref 11.5–15.5)
WBC: 14.2 10*3/uL — ABNORMAL HIGH (ref 4.0–10.5)
nRBC: 0 % (ref 0.0–0.2)

## 2019-01-05 LAB — BASIC METABOLIC PANEL
Anion gap: 9 (ref 5–15)
BUN: 23 mg/dL (ref 8–23)
CO2: 19 mmol/L — ABNORMAL LOW (ref 22–32)
Calcium: 8.3 mg/dL — ABNORMAL LOW (ref 8.9–10.3)
Chloride: 109 mmol/L (ref 98–111)
Creatinine, Ser: 1.2 mg/dL (ref 0.61–1.24)
GFR calc Af Amer: >60 mL/min (ref >60)
GFR, EST NON AFRICAN AMERICAN: 57 mL/min — AB (ref >60)
GLUCOSE: 150 mg/dL — AB (ref 70–99)
Potassium: 4 mmol/L (ref 3.5–5.1)
Sodium: 137 mmol/L (ref 135–145)

## 2019-01-05 LAB — GLUCOSE, CAPILLARY
Glucose-Capillary: 115 mg/dL — ABNORMAL HIGH (ref 70–99)
Glucose-Capillary: 117 mg/dL — ABNORMAL HIGH (ref 70–99)
Glucose-Capillary: 120 mg/dL — ABNORMAL HIGH (ref 70–99)
Glucose-Capillary: 120 mg/dL — ABNORMAL HIGH (ref 70–99)
Glucose-Capillary: 126 mg/dL — ABNORMAL HIGH (ref 70–99)
Glucose-Capillary: 151 mg/dL — ABNORMAL HIGH (ref 70–99)
Glucose-Capillary: 152 mg/dL — ABNORMAL HIGH (ref 70–99)

## 2019-01-05 LAB — HEMOGLOBIN A1C
Hgb A1c MFr Bld: 6.3 % — ABNORMAL HIGH (ref 4.8–5.6)
Mean Plasma Glucose: 134.11 mg/dL

## 2019-01-05 LAB — PROTIME-INR
INR: 1.7 — AB (ref 0.8–1.2)
Prothrombin Time: 20.2 seconds — ABNORMAL HIGH (ref 11.4–15.2)

## 2019-01-05 LAB — TRIGLYCERIDES: TRIGLYCERIDES: 80 mg/dL (ref <150)

## 2019-01-05 LAB — HEPARIN LEVEL (UNFRACTIONATED): Heparin Unfractionated: 0.46 IU/mL (ref 0.30–0.70)

## 2019-01-05 MED ORDER — CIPROFLOXACIN IN D5W 200 MG/100ML IV SOLN
200.0000 mg | Freq: Two times a day (BID) | INTRAVENOUS | Status: AC
  Administered 2019-01-05 – 2019-01-07 (×6): 200 mg via INTRAVENOUS
  Filled 2019-01-05 (×6): qty 100

## 2019-01-05 MED ORDER — CHLORHEXIDINE GLUCONATE 0.12 % MT SOLN
15.0000 mL | Freq: Two times a day (BID) | OROMUCOSAL | Status: DC
  Administered 2019-01-05 – 2019-01-07 (×4): 15 mL via OROMUCOSAL
  Filled 2019-01-05 (×3): qty 15

## 2019-01-05 MED ORDER — ORAL CARE MOUTH RINSE
15.0000 mL | Freq: Two times a day (BID) | OROMUCOSAL | Status: DC
  Administered 2019-01-06 – 2019-01-07 (×3): 15 mL via OROMUCOSAL

## 2019-01-05 MED ORDER — HEPARIN (PORCINE) 25000 UT/250ML-% IV SOLN
1050.0000 [IU]/h | INTRAVENOUS | Status: DC
  Administered 2019-01-05 – 2019-01-06 (×2): 1200 [IU]/h via INTRAVENOUS
  Administered 2019-01-07: 1050 [IU]/h via INTRAVENOUS
  Filled 2019-01-05 (×3): qty 250

## 2019-01-05 NOTE — Progress Notes (Signed)
Verbal by Dr. Leonie Man to keep SBP <180 after 24 hour IR window.

## 2019-01-05 NOTE — Plan of Care (Signed)
  Problem: Activity: Goal: Ability to tolerate increased activity will improve Outcome: Progressing   Problem: Respiratory: Goal: Ability to maintain a clear airway and adequate ventilation will improve Outcome: Adequate for Discharge   Problem: Role Relationship: Goal: Method of communication will improve Outcome: Adequate for Discharge

## 2019-01-05 NOTE — Progress Notes (Signed)
*  PRELIMINARY RESULTS* Echocardiogram 2D Echocardiogram has been performed.  [name] 01/05/2019, 3:11 PM

## 2019-01-05 NOTE — Progress Notes (Signed)
NAME:  [name], MRN:  [id number], DOB:  [birth year], LOS: 1 ADMISSION DATE:  01/04/2019, CONSULTATION DATE:  3/14 REFERRING MD:  Cheral Marker MD, CHIEF COMPLAINT:  stroke  Brief History   83 year old man with history of atrial fibrillation on Coumadin, CAD status post CABG in 1990s, hypertension, DM 2, PVD, left AKA presenting with slurred speech, right-sided facial droop found to have a left MCA occlusion s/p left common carotid arteriogram followed by revascularization of left MCA M1 occlusion by VIR.   Past Medical History  atrial fibrillation on Coumadin, CAD status post CABG in 1990s, hypertension, DM 2, squamous cell jaw cancer, PVD, left AKA  Significant Hospital Events   3/14 Admit, VIR procedure 3/15: Passed spontaneous breathing trial, extubated Consults:  VIR  Procedures:  3/14 left common carotid arteriogram followed by revascularization of left MCA M1 occlusion by VIR  Significant Diagnostic Tests:  AXR 3/14> Orogastric or nasogastric tube in place with its tip and side hole in the proximal stomach.  CTA head, neck 3/14> Positive for Left MCA M1 ELVO with favorable CT Perfusion  CT head w/o contrast 3/14> Small age indeterminate small lacunar infarct in the ventral left thalamus. No acute cortically based infarct identified. No acute intracranial hemorrhage, or mass effect.  Micro Data:  MRSA PCR>  Antimicrobials:  None  Interim history/subjective:  He has passed his spontaneous breathing trial Objective   Blood pressure 112/66, pulse 66, temperature (Abnormal) 97.4 F (36.3 C), temperature source Axillary, resp. rate (Abnormal) 21, height 5' 8"  (1.727 m), weight 79.8 kg, SpO2 100 %.    Vent Mode: PRVC FiO2 (%):  [40 %] 40 % Set Rate:  [16 bmp] 16 bmp Vt Set:  [540 mL] 540 mL PEEP:  [5 cmH20] 5 cmH20 Plateau Pressure:  [14 cmH20-16 cmH20] 14 cmH20   Intake/Output Summary (Last 24 hours) at 01/05/2019 1103 Last data filed at 01/05/2019 0800 Gross per 24  hour  Intake 767.17 ml  Output 550 ml  Net 217.17 ml   Filed Weights   01/04/19 1618  Weight: 79.8 kg    Examination: General: This is a 83 year old male patient who appears much younger than stated age he is in no distress on spontaneous breathing trial HEENT normocephalic atraumatic no jugular venous distention orally intubated mucous membranes moist Pulmonary: Clear to auscultation.  He is able to pull in excess of 1200 cc tidal volume on pressure support of 5 Cardiac: Regular irregular with atrial fibrillation on telemetry Abdomen: Soft nontender no organomegaly Extremities: Warm and dry brisk capillary refill Neuro: Awake, follows commands, moves all extremities. GU: Clear yellow  Assessment & Plan:  83 year old man with history of atrial fibrillation on Coumadin, CAD status post CABG in 1990s, hypertension, DM 2, PVD, left AKA presenting with slurred speech, right-sided facial droop found to have a left MCA occlusion s/p left common carotid arteriogram followed by revascularization of left MCA M1 occlusion by VIR.  Acute MCA CVA, L MCA occlusion s/p thrombectomy 3/14, HTN: Leukocytosis of 14, likely reactive. Plan Continue to titrate Cleviprex, ensuring systolic blood pressure less than 409 and diastolic less than 811 but gradually normalize over the next 5 to 7 days Continuing Mevacor due to statin intolerance Follow-up TTE Resuming amlodipine losartan and hydrochlorothiazide Further neurological testing and diagnostics per stroke team  Acute respiratory failure due to acute encephalopathy and need for airway protection:  PCXR: well aeration, no infiltrate He  passed a spontaneous breathing trial Plan Extubate Reflux and aspiration precautions Pulse oximetry  titrate oxygen PRN chest x-ray  Metabolic acidosis with normal anion gap, AKI on CKD 3: Bicarb 19 and anion gap of 10.  Creatinine 1.25.  Baseline is around 1.  Also his previous bicarb has been around 17-20. Plan  A.m. chemistry Follow-up renal ultrasound  Atrial fibrillation: On Coumadin.  INR subtherapeutic at 1.8 on admission. Hold Coumadin for now. Plan Holding beta-blocker Holding warfarin given need to get sheath out Decide on heparin after sheath removal  DM:  Plan  Sliding scale and insulin   Chronic anemia: Hemoglobin 11.9.  Baseline around 10 Plan Trend CBC Transfuse for hemoglobin less than 7 or for evidence of acute bleeding  Best practice:  Diet: NPO Pain/Anxiety/Delirium protocol (if indicated): fent prn VAP protocol (if indicated): Ordered DVT prophylaxis: SCD GI prophylaxis: PPI Glucose control: SSI Mobility: PT/OT Code Status: Full Family Communication: None at bedside. Neurology discussed with family per notes Disposition: Remains critically ill, however he is passed a spontaneous breathing trial and we will be extubating him today.  From a neurological standpoint he looks much improved he is moving all extremities briskly following commands.  We will extubate him.  Continue supportive measures.  All other interventions including stroke neurodiagnostics per neurology services   Critical care time: 32 min     Erick Colace ACNP-BC [place] Pager # [phone] OR # [phone] if no answer

## 2019-01-05 NOTE — Progress Notes (Signed)
Referring Physician(s): CODE STROKE- Kerney Elbe  Supervising Physician: Luanne Bras  Patient Status:  [facility] - In-pt  Chief Complaint: None  Subjective:  Left MCA M1 segment occlusion s/p emergent mechanical thrombectomy 01/04/2019 by Dr. Estanislado Pandy. Patient awake and alert laying in bed. He is intubated but follows simple commands. Can spontaneously move all extremities. Right groin incision c/d/i with sheath intact.  MR brain 01/05/2019: 1. Multifocal acute ischemia within the left MCA distribution, predominantly located along the insula and basal ganglia. The area of infarction is much smaller than the area of at risk brain demonstrated on the earlier perfusion scan. 2. No mass effect or acute hemorrhage. 3. Chronic ischemic microangiopathy and old small vessel infarcts.   Allergies: Lipitor [atorvastatin]; Simvastatin; Penicillins; and Sulfa antibiotics  Medications: Prior to Admission medications   Medication Sig Start Date End Date Taking? Authorizing Provider  amLODipine (NORVASC) 10 MG tablet Take 1 tablet (10 mg total) by mouth daily. 04/11/18  Yes Herminio Commons, MD  losartan-hydrochlorothiazide (HYZAAR) 100-12.5 MG tablet Take 1 tablet by mouth daily.   Yes [provider]  lovastatin (MEVACOR) 10 MG tablet Take 10 mg by mouth daily.    Yes [provider]  nebivolol (BYSTOLIC) 10 MG tablet Take 10 mg by mouth daily.   Yes [provider]  pantoprazole (PROTONIX) 40 MG tablet Take 1 tablet (40 mg total) by mouth daily. 02/15/18  Yes TatShanon Brow, MD  warfarin (COUMADIN) 1 MG tablet Take 2 tablets daily except 1 tablet on Mondays, Wednesdays and Fridays or as directed 10/21/18  Yes Herminio Commons, MD  cetirizine (ZYRTEC) 10 MG tablet Take 10 mg by mouth daily.    [provider]  feeding supplement (BOOST / RESOURCE BREEZE) LIQD Take 1 Container by mouth 3 (three) times daily between meals. Patient taking differently:  Take 1 Container by mouth 2 (two) times daily.  11/16/15   Loleta Chance, MD  ferrous sulfate 325 (65 FE) MG tablet Take 65 mg by mouth daily with breakfast.    [provider]  methocarbamol (ROBAXIN) 500 MG tablet Take 1-2 tablets (500-1,000 mg total) by mouth every 6 (six) hours as needed for muscle spasms. 7/82/95   Delora Fuel, MD  Multiple Vitamins-Minerals (CENTRUM SILVER PO) Take 1 tablet by mouth daily.      [provider]  nystatin cream (MYCOSTATIN) Apply 1 application topically 3 (three) times daily.    [provider]  Omega-3 Fatty Acids (FISH OIL) 1200 MG CAPS Take 1,200 mg by mouth daily.    [provider]  Pediatric Multivitamins-Iron (FLINTSTONES PLUS IRON PO) Take 1 tablet by mouth daily.    [provider]  Probiotic Product (Leary) Take 1 capsule by mouth daily.     [provider]  silver sulfADIAZINE (SILVADENE) 1 % cream Apply topically daily. Patient taking differently: Apply 1 application topically daily. Applied to affected area of foot. 11/16/15   Loleta Chance, MD  traMADol (ULTRAM) 50 MG tablet Take 1 tablet (50 mg total) by mouth every 6 (six) hours as needed. 04/12/29   Delora Fuel, MD  vitamin B-12 (CYANOCOBALAMIN) 1000 MCG tablet Take 2,000 mcg by mouth every morning.    [provider]     Vital Signs: BP 112/66    Pulse 64    Temp (!) 97.4 F (36.3 C) (Axillary)    Resp 16    Ht 5' 8"  (1.727 m)    Wt 176 lb (  79.8 kg)    SpO2 100%    BMI 26.76 kg/m   Physical Exam Vitals signs and nursing note reviewed.  Constitutional:      General: He is not in acute distress.    Appearance: Normal appearance.     Comments: Intubated without sedation.  Pulmonary:     Effort: Pulmonary effort is normal. No respiratory distress.     Comments: Intubated without sedation. Skin:    General: Skin is warm and dry.     Comments: Right groin incision soft without active bleeding or hematoma.    Neurological:     Mental Status: He is alert.     Comments: Intubated without sedation. Awake and alert and follows simple commands. PERRL bilaterally. EOMs intact bilaterally without nystagmus or subjective diplopia. Unable to assess facial asymmetry due to ET tube. Unable to assess tongue protrusion due to ET tube. Can spontaneously move all extremities. No pronator drift. Visual fields, fine motor and coordination, gait, romberg, heel to toe not assessed. Distal pulses palpable bilaterally with Doppler.  Psychiatric:     Comments: Intubated without sedation.     Imaging: Ct Angio Head W Or Wo Contrast  Result Date: 01/04/2019 CLINICAL DATA:  83 year old male with aphasia and right facial droop. EXAM: CT ANGIOGRAPHY HEAD AND NECK CT PERFUSION BRAIN TECHNIQUE: Multidetector CT imaging of the head and neck was performed using the standard protocol during bolus administration of intravenous contrast. Multiplanar CT image reconstructions and MIPs were obtained to evaluate the vascular anatomy. Carotid stenosis measurements (when applicable) are obtained utilizing NASCET criteria, using the distal internal carotid diameter as the denominator. Multiphase CT imaging of the brain was performed following IV bolus contrast injection. Subsequent parametric perfusion maps were calculated using RAPID software. CONTRAST:  131m OMNIPAQUE IOHEXOL 350 MG/ML SOLN COMPARISON:  Head CT without contrast 1620 hours today. FINDINGS: CT Brain Perfusion Findings: CBF (<30%) Volume: 0 milliliters (up to 15 milliliters using CBF less than 38% criteria) ASPECTS 10 at 1620 hours today. Perfusion (Tmax>6.0s) volume: 95 milliliters, with hypoperfusion index of 0.2 (favorable) Mismatch Volume: 95 milliliters Infarction Location:Little if any core infarct, penumbra confined to the left MCA territory. CTA NECK Skeleton: Chronic deformity with postoperative changes to the mandible. Prior sternotomy. Mild for age cervical  spine degeneration. No acute osseous abnormality identified. Upper chest: Prior CABG. Other neck: Mild left thyroid goiter. Atrophied left submandibular gland. No neck mass or lymphadenopathy. Aortic arch: Mild Calcified aortic atherosclerosis. 3 vessel arch configuration. Right carotid system: No stenosis.  Minor atherosclerosis. Left carotid system: No left CCA origin stenosis. Soft plaque in the distal left CCA and at the left ICA origin (low-density on series 4, image 81) not resulting in hemodynamically significant stenosis. Some superimposed left ICA bulb calcified plaque. Vertebral arteries: No proximal right subclavian artery stenosis despite plaque. Normal right vertebral artery origin. Patent right vertebral artery to the skull base without stenosis. No proximal left subclavian artery stenosis despite some plaque. Mild plaque at the left vertebral artery origin with mild if any stenosis. Patent left vertebral artery to the skull base without additional stenosis. CTA HEAD Posterior circulation: Mildly dominant left vertebral artery. Patent PICA origins. No distal vertebral stenosis. Patent vertebrobasilar junction and basilar artery without stenosis. SCA and left PCA origins are normal. Fetal type right PCA origin. Left posterior communicating artery diminutive or absent. Left PCA branches are within normal limits. Right PCA branches are within normal limits. Anterior circulation: Both ICA siphons are patent. On the left there  is mild calcified plaque without stenosis. Similar calcified plaque on the right without stenosis. Normal right posterior communicating artery origin, the vessel itself is tortuous. Patent carotid termini, MCA and ACA origins. Anterior communicating artery and bilateral ACA branches are within normal limits. Right MCA M1, bifurcation, and right MCA branches are within normal limits. The left MCA M1 is thrombosed 8 or 10 millimeters from its origin. There is some could reconstitution  of the left MCA branches (series 10, image 20). Venous sinuses: Patent. Anatomic variants: Mildly dominant left vertebral artery. Fetal type right PCA origin. Review of the MIP images confirms the above findings IMPRESSION: 1. Positive for Left MCA M1 ELVO with favorable CT Perfusion parameters: - estimated core infarct 0-15 mL and Left MCA penumbra of at least 95 mL. HPI 0.2. - this was discussed by telephone with tele-neurology Dr. Meryl Crutch on 01/04/2019 at 1706 hours. 2. Other vascular findings include soft plaque at the left ICA origin. Atherosclerosis but no hemodynamically significant stenosis elsewhere in the head or neck. Electronically Signed   By: Genevie Ann M.D.   On: 01/04/2019 17:10   Ct Angio Neck W Or Wo Contrast  Result Date: 01/04/2019 CLINICAL DATA:  83 year old male with aphasia and right facial droop. EXAM: CT ANGIOGRAPHY HEAD AND NECK CT PERFUSION BRAIN TECHNIQUE: Multidetector CT imaging of the head and neck was performed using the standard protocol during bolus administration of intravenous contrast. Multiplanar CT image reconstructions and MIPs were obtained to evaluate the vascular anatomy. Carotid stenosis measurements (when applicable) are obtained utilizing NASCET criteria, using the distal internal carotid diameter as the denominator. Multiphase CT imaging of the brain was performed following IV bolus contrast injection. Subsequent parametric perfusion maps were calculated using RAPID software. CONTRAST:  131m OMNIPAQUE IOHEXOL 350 MG/ML SOLN COMPARISON:  Head CT without contrast 1620 hours today. FINDINGS: CT Brain Perfusion Findings: CBF (<30%) Volume: 0 milliliters (up to 15 milliliters using CBF less than 38% criteria) ASPECTS 10 at 1620 hours today. Perfusion (Tmax>6.0s) volume: 95 milliliters, with hypoperfusion index of 0.2 (favorable) Mismatch Volume: 95 milliliters Infarction Location:Little if any core infarct, penumbra confined to the left MCA territory. CTA NECK Skeleton:  Chronic deformity with postoperative changes to the mandible. Prior sternotomy. Mild for age cervical spine degeneration. No acute osseous abnormality identified. Upper chest: Prior CABG. Other neck: Mild left thyroid goiter. Atrophied left submandibular gland. No neck mass or lymphadenopathy. Aortic arch: Mild Calcified aortic atherosclerosis. 3 vessel arch configuration. Right carotid system: No stenosis.  Minor atherosclerosis. Left carotid system: No left CCA origin stenosis. Soft plaque in the distal left CCA and at the left ICA origin (low-density on series 4, image 81) not resulting in hemodynamically significant stenosis. Some superimposed left ICA bulb calcified plaque. Vertebral arteries: No proximal right subclavian artery stenosis despite plaque. Normal right vertebral artery origin. Patent right vertebral artery to the skull base without stenosis. No proximal left subclavian artery stenosis despite some plaque. Mild plaque at the left vertebral artery origin with mild if any stenosis. Patent left vertebral artery to the skull base without additional stenosis. CTA HEAD Posterior circulation: Mildly dominant left vertebral artery. Patent PICA origins. No distal vertebral stenosis. Patent vertebrobasilar junction and basilar artery without stenosis. SCA and left PCA origins are normal. Fetal type right PCA origin. Left posterior communicating artery diminutive or absent. Left PCA branches are within normal limits. Right PCA branches are within normal limits. Anterior circulation: Both ICA siphons are patent. On the left there is mild  calcified plaque without stenosis. Similar calcified plaque on the right without stenosis. Normal right posterior communicating artery origin, the vessel itself is tortuous. Patent carotid termini, MCA and ACA origins. Anterior communicating artery and bilateral ACA branches are within normal limits. Right MCA M1, bifurcation, and right MCA branches are within normal limits.  The left MCA M1 is thrombosed 8 or 10 millimeters from its origin. There is some could reconstitution of the left MCA branches (series 10, image 20). Venous sinuses: Patent. Anatomic variants: Mildly dominant left vertebral artery. Fetal type right PCA origin. Review of the MIP images confirms the above findings IMPRESSION: 1. Positive for Left MCA M1 ELVO with favorable CT Perfusion parameters: - estimated core infarct 0-15 mL and Left MCA penumbra of at least 95 mL. HPI 0.2. - this was discussed by telephone with tele-neurology Dr. Meryl Crutch on 01/04/2019 at 1706 hours. 2. Other vascular findings include soft plaque at the left ICA origin. Atherosclerosis but no hemodynamically significant stenosis elsewhere in the head or neck. Electronically Signed   By: Genevie Ann M.D.   On: 01/04/2019 17:10   Mr Brain Wo Contrast  Result Date: 01/05/2019 CLINICAL DATA:  Stroke follow-up EXAM: MRI HEAD WITHOUT CONTRAST TECHNIQUE: Multiplanar, multiecho pulse sequences of the brain and surrounding structures were obtained without intravenous contrast. COMPARISON:  Cerebral angiogram 01/04/2019 CTA/CTP head 01/04/2019 FINDINGS: BRAIN: There are multiple punctate foci of acute ischemia within the left MCA territory, predominantly along the left insula and left basal ganglia. No large infarction. The infarcted area is much smaller than the penumbra identified on the earlier perfusion study. The midline structures are normal. There are old gangliothalamic lacunar infarcts. Multifocal white matter hyperintensity, most commonly due to chronic ischemic microangiopathy. The cerebral and cerebellar volume are age-appropriate. Focus of chronic hemosiderin deposition along the right parietal lobe. VASCULAR: Major intracranial arterial and venous sinus flow voids are normal. SKULL AND UPPER CERVICAL SPINE: Calvarial bone marrow signal is normal. There is no skull base mass. Visualized upper cervical spine and soft tissues are normal.  SINUSES/ORBITS: No fluid levels or advanced mucosal thickening. No mastoid or middle ear effusion. The orbits are normal. IMPRESSION: 1. Multifocal acute ischemia within the left MCA distribution, predominantly located along the insula and basal ganglia. The area of infarction is much smaller than the area of at risk brain demonstrated on the earlier perfusion scan. 2. No mass effect or acute hemorrhage. 3. Chronic ischemic microangiopathy and old small vessel infarcts. Electronically Signed   By: Ulyses Jarred M.D.   On: 01/05/2019 04:04   Ct Cerebral Perfusion W Contrast  Result Date: 01/04/2019 CLINICAL DATA:  83 year old male with aphasia and right facial droop. EXAM: CT ANGIOGRAPHY HEAD AND NECK CT PERFUSION BRAIN TECHNIQUE: Multidetector CT imaging of the head and neck was performed using the standard protocol during bolus administration of intravenous contrast. Multiplanar CT image reconstructions and MIPs were obtained to evaluate the vascular anatomy. Carotid stenosis measurements (when applicable) are obtained utilizing NASCET criteria, using the distal internal carotid diameter as the denominator. Multiphase CT imaging of the brain was performed following IV bolus contrast injection. Subsequent parametric perfusion maps were calculated using RAPID software. CONTRAST:  138m OMNIPAQUE IOHEXOL 350 MG/ML SOLN COMPARISON:  Head CT without contrast 1620 hours today. FINDINGS: CT Brain Perfusion Findings: CBF (<30%) Volume: 0 milliliters (up to 15 milliliters using CBF less than 38% criteria) ASPECTS 10 at 1620 hours today. Perfusion (Tmax>6.0s) volume: 95 milliliters, with hypoperfusion index of 0.2 (favorable) Mismatch Volume:  95 milliliters Infarction Location:Little if any core infarct, penumbra confined to the left MCA territory. CTA NECK Skeleton: Chronic deformity with postoperative changes to the mandible. Prior sternotomy. Mild for age cervical spine degeneration. No acute osseous abnormality  identified. Upper chest: Prior CABG. Other neck: Mild left thyroid goiter. Atrophied left submandibular gland. No neck mass or lymphadenopathy. Aortic arch: Mild Calcified aortic atherosclerosis. 3 vessel arch configuration. Right carotid system: No stenosis.  Minor atherosclerosis. Left carotid system: No left CCA origin stenosis. Soft plaque in the distal left CCA and at the left ICA origin (low-density on series 4, image 81) not resulting in hemodynamically significant stenosis. Some superimposed left ICA bulb calcified plaque. Vertebral arteries: No proximal right subclavian artery stenosis despite plaque. Normal right vertebral artery origin. Patent right vertebral artery to the skull base without stenosis. No proximal left subclavian artery stenosis despite some plaque. Mild plaque at the left vertebral artery origin with mild if any stenosis. Patent left vertebral artery to the skull base without additional stenosis. CTA HEAD Posterior circulation: Mildly dominant left vertebral artery. Patent PICA origins. No distal vertebral stenosis. Patent vertebrobasilar junction and basilar artery without stenosis. SCA and left PCA origins are normal. Fetal type right PCA origin. Left posterior communicating artery diminutive or absent. Left PCA branches are within normal limits. Right PCA branches are within normal limits. Anterior circulation: Both ICA siphons are patent. On the left there is mild calcified plaque without stenosis. Similar calcified plaque on the right without stenosis. Normal right posterior communicating artery origin, the vessel itself is tortuous. Patent carotid termini, MCA and ACA origins. Anterior communicating artery and bilateral ACA branches are within normal limits. Right MCA M1, bifurcation, and right MCA branches are within normal limits. The left MCA M1 is thrombosed 8 or 10 millimeters from its origin. There is some could reconstitution of the left MCA branches (series 10, image 20).  Venous sinuses: Patent. Anatomic variants: Mildly dominant left vertebral artery. Fetal type right PCA origin. Review of the MIP images confirms the above findings IMPRESSION: 1. Positive for Left MCA M1 ELVO with favorable CT Perfusion parameters: - estimated core infarct 0-15 mL and Left MCA penumbra of at least 95 mL. HPI 0.2. - this was discussed by telephone with tele-neurology Dr. Meryl Crutch on 01/04/2019 at 1706 hours. 2. Other vascular findings include soft plaque at the left ICA origin. Atherosclerosis but no hemodynamically significant stenosis elsewhere in the head or neck. Electronically Signed   By: Genevie Ann M.D.   On: 01/04/2019 17:10   Dg Chest Port 1 View  Result Date: 01/04/2019 CLINICAL DATA:  Intubation EXAM: PORTABLE CHEST 1 VIEW COMPARISON:  None. FINDINGS: Endotracheal tube tip is just above the carina. Retraction of 5 cm would place it at the level of the clavicular heads. The orogastric tube tip is below the field of view. Moderate cardiomegaly and findings of prior median sternotomy and CABG. Lungs are clear. IMPRESSION: Endotracheal tube tip just above the carina. Retraction of 5 cm is recommended. Electronically Signed   By: Ulyses Jarred M.D.   On: 01/04/2019 23:32   Dg Abd Portable 1v  Result Date: 01/04/2019 CLINICAL DATA:  Feeding tube placement. EXAM: PORTABLE ABDOMEN - 1 VIEW COMPARISON:  Abdomen and pelvis CT dated 10/29/2016. FINDINGS: Orogastric or nasogastric tube in place with its tip and side hole in the proximal stomach. No feeding tube seen. Normal bowel gas pattern. Excreted contrast in the urinary tract. Lumbar and lower thoracic spine degenerative changes. Enlarged  cardiac silhouette. IMPRESSION: 1. No feeding tube seen. 2. Cardiomegaly. Electronically Signed   By: Claudie Revering M.D.   On: 01/04/2019 20:56   Ct Head Code Stroke Wo Contrast  Addendum Date: 01/04/2019   ADDENDUM REPORT: 01/04/2019 16:44 ADDENDUM: Study discussed by telephone with Dr. Davonna Belling on 01/04/2019 at 1633 hours. Electronically Signed   By: Genevie Ann M.D.   On: 01/04/2019 16:44   Result Date: 01/04/2019 CLINICAL DATA:  Code stroke. 83 year old male with slurred speech at noon today. Weakness. Right facial droop. EXAM: CT HEAD WITHOUT CONTRAST TECHNIQUE: Contiguous axial images were obtained from the base of the skull through the vertex without intravenous contrast. COMPARISON:  None. FINDINGS: Brain: Cerebral volume is within normal limits for age. No ventriculomegaly. No acute intracranial hemorrhage identified. No midline shift, mass effect, or evidence of intracranial mass lesion. Small age indeterminate hypodensity in the ventral left thalamus on series 2, image 15. Largely normal gray-white matter differentiation throughout the brain otherwise. No cortically based acute infarct identified. Vascular: Calcified atherosclerosis at the skull base. No suspicious intracranial vascular hyperdensity. Skull: Negative. Sinuses/Orbits: Visualized paranasal sinuses and mastoids are clear. Other: No acute orbit or scalp soft tissue finding. ASPECTS [facility] Stroke Program Early CT Score) - Ganglionic level infarction (caudate, lentiform nuclei, internal capsule, insula, M1-M3 cortex): 7 - Supraganglionic infarction (M4-M6 cortex): 3 Total score (0-10 with 10 being normal): 10 IMPRESSION: 1. Small age indeterminate small lacunar infarct in the ventral left thalamus. 2. No acute cortically based infarct identified. No acute intracranial hemorrhage, or mass effect. 3. ASPECTS is 10. Electronically Signed: By: Genevie Ann M.D. On: 01/04/2019 16:32    Labs:  CBC: Recent Labs    03/29/18 1218  04/24/18 0838 07/09/18 1022 01/04/19 1619 01/05/19 0611  WBC 6.8  --   --  7.3 14.0* 14.2*  HGB 8.8*   < > 10.5* 9.8* 11.9* 10.2*  HCT 27.8*   < > 31.5* 30.6* 36.5* 32.1*  PLT 180  --   --  313 369 359   < > = values in this interval not displayed.    COAGS: Recent Labs    11/25/18 0855  12/16/18 0908 01/04/19 1619 01/05/19 0938  INR 2.2 1.8* 1.8* 1.7*  APTT  --   --  32  --     BMP: Recent Labs    03/29/18 1218 03/30/18 0648 01/04/19 1619 01/04/19 1644 01/05/19 0611  NA 141 136 138  --  137  K 3.8 3.7 3.9  --  4.0  CL 115* 113* 109  --  109  CO2 20* 17* 19*  --  19*  GLUCOSE 153* 110* 162*  --  150*  BUN 26* 18 26*  --  23  CALCIUM 7.7* 7.5* 8.6*  --  8.3*  CREATININE 1.16 1.02 1.25* 1.10 1.20  GFRNONAA 58* >60 54*  --  57*  GFRAA >60 >60 >60  --  >60    LIVER FUNCTION TESTS: Recent Labs    03/26/18 0250 03/26/18 1110 03/27/18 0333 01/04/19 1619  BILITOT 0.5 1.9* 1.5* 0.5  AST 22 23 24 20   ALT 18 18 16* 18  ALKPHOS 100 100 81 110  PROT 5.6* 5.8* 4.8* 7.8  ALBUMIN 2.7* 2.8* 2.4* 3.5    Assessment and Plan:  Left MCA M1 segment occlusion s/p emergent mechanical thrombectomy 01/04/2019 by Dr. Estanislado Pandy. Patient's condition improved- remains intubated but follows simple commands, moving all 4s. Right groin incision stable with sheath intact- INR today  1.7, per Dr. Estanislado Pandy sheath to remain until INR 1.5 or below. Appreciate and agree with neurology management. NIR to follow.   Electronically Signed: Earley Abide, PA-C 01/05/2019, 12:03 PM   I spent a total of 25 Minutes at the the patient's bedside AND on the patient's hospital floor or unit, greater than 50% of which was counseling/coordinating care for left MCA M1 segment occlusion s/p revascularization.

## 2019-01-05 NOTE — Progress Notes (Signed)
PT Cancellation Note  Patient Details Name: [name] MRN: [id number] DOB: 06-04-1936   Cancelled Treatment:    Reason Eval/Treat Not Completed: Patient not medically ready . Pt remains to have R groin sheath, has an a-line, and remains intubated and on strict bed rest. Acute PT to return as able, as appropriate.  Kittie Plater, PT, DPT Acute Rehabilitation Services Pager #: [phone] Office #: [phone]   [name] 01/05/2019, 9:23 AM

## 2019-01-05 NOTE — Progress Notes (Signed)
[place] for Heparin Indication: atrial fibrillation  Allergies  Allergen Reactions  . Lipitor [Atorvastatin] Other (See Comments)    SEVERE HEADACHE  . Simvastatin Other (See Comments)    MUSCLE ACHES  . Penicillins Rash    Has patient had a PCN reaction causing immediate rash, facial/tongue/throat swelling, SOB or lightheadedness with hypotension: Yes Has patient had a PCN reaction causing severe rash involving mucus membranes or skin necrosis: No Has patient had a PCN reaction that required hospitalization No Has patient had a PCN reaction occurring within the last 10 years: No If all of the above answers are "NO", then may proceed with Cephalosporin use.   . Sulfa Antibiotics Rash    Tolerates silver sulfadiazine cream at home    Patient Measurements: Height: 5' 8"  (172.7 cm) Weight: 176 lb (79.8 kg) IBW/kg (Calculated) : 68.4  Vital Signs: Temp: 97.4 F (36.3 C) (03/15 1600) Temp Source: Oral (03/15 1600) BP: 144/54 (03/15 1945) Pulse Rate: 56 (03/15 1945)  Labs: Recent Labs    01/04/19 1619 01/04/19 1644 01/05/19 0611 01/05/19 0938 01/05/19 2030  HGB 11.9*  --  10.2*  --   --   HCT 36.5*  --  32.1*  --   --   PLT 369  --  359  --   --   APTT 32  --   --   --   --   LABPROT 21.0*  --   --  20.2*  --   INR 1.8*  --   --  1.7*  --   HEPARINUNFRC  --   --   --   --  0.46  CREATININE 1.25* 1.10 1.20  --   --     Estimated Creatinine Clearance: 47.5 mL/min (by C-G formula based on SCr of 1.2 mg/dL).    Assessment: 83 year old man with history of atrial fibrillation on Coumadin presenting with slurred speech, right-sided facial droop found to have a left MCA occlusion s/p left common carotid arteriogram followed by revascularization of left MCA M1 occlusion by VIR.  Pharmacy consulted to start heparin drip for now until oral anticoagulation can be restarted.  Initial heparin level therapeutic  Home regimen: Warfarin 1  mg tabs -Take 2 tablets daily except 1 tablet on Mondays, Wednesdays and Fridays  Goal of Therapy:  Heparin level 0.3-0.7 units/ml Monitor platelets by anticoagulation protocol: Yes   Plan:  Continue heparin at 1200 units / hr Qam HL and CBC  Thank you Anette Guarneri, PharmD  Please see AMION for all Pharmacists' Contact Phone Numbers 01/05/2019, 8:59 PM

## 2019-01-05 NOTE — Procedures (Signed)
Extubation Procedure Note  Patient Details:   Name: [name] DOB: [birth year] MRN: [id number]   Airway Documentation:    Vent end date: 01/05/19 Vent end time: 1100   Evaluation  O2 sats: stable throughout Complications: No apparent complications Patient did tolerate procedure well. Bilateral Breath Sounds: Clear   Yes, Placed on 4L [place]  SPO2 [id number]  Gonzella Lex 01/05/2019, 11:09 AM

## 2019-01-05 NOTE — Progress Notes (Addendum)
ANTICOAGULATION CONSULT NOTE - Initial Consult  Pharmacy Consult for Heparin Indication: atrial fibrillation  Allergies  Allergen Reactions  . Lipitor [Atorvastatin] Other (See Comments)    SEVERE HEADACHE  . Simvastatin Other (See Comments)    MUSCLE ACHES  . Penicillins Rash    Has patient had a PCN reaction causing immediate rash, facial/tongue/throat swelling, SOB or lightheadedness with hypotension: Yes Has patient had a PCN reaction causing severe rash involving mucus membranes or skin necrosis: No Has patient had a PCN reaction that required hospitalization No Has patient had a PCN reaction occurring within the last 10 years: No If all of the above answers are "NO", then may proceed with Cephalosporin use.   . Sulfa Antibiotics Rash    Tolerates silver sulfadiazine cream at home    Patient Measurements: Height: 5' 8"  (172.7 cm) Weight: 176 lb (79.8 kg) IBW/kg (Calculated) : 68.4  Vital Signs: Temp: 97.4 F (36.3 C) (03/15 0800) Temp Source: Axillary (03/15 0800) BP: 112/66 (03/15 0900) Pulse Rate: 64 (03/15 1108)  Labs: Recent Labs    01/04/19 1619 01/04/19 1644 01/05/19 0611 01/05/19 0938  HGB 11.9*  --  10.2*  --   HCT 36.5*  --  32.1*  --   PLT 369  --  359  --   APTT 32  --   --   --   LABPROT 21.0*  --   --  20.2*  INR 1.8*  --   --  1.7*  CREATININE 1.25* 1.10 1.20  --     Estimated Creatinine Clearance: 47.5 mL/min (by C-G formula based on SCr of 1.2 mg/dL).   Medical History: Past Medical History:  Diagnosis Date  . Arteriosclerotic cardiovascular disease (ASCVD)    CABG in 1990s; 2002 total obstruction of LAD, CX and RCA with patent grafts and nl EF; Stress nuc. 2008 - mild LV dilation; normal EF; questionable small anteroapical scar; no ischemia  . Arthritis    "left knee" (11/08/2015)  . Atrial fibrillation ([place])   . Cellulitis 11/08/2015   "both feet"  . Chronic anticoagulation   . Dental crowns present   . Diabetic peripheral  neuropathy (HCC)    bilateral lower leg, hands  . History of blood transfusion    "related to my leg surgery?"  . Hypertension    states under control with meds., has been on med. x "long time"  . Iron deficiency anemia   . Jaw cancer ([place]) 1990s   "squamous cell"  . Myocardial infarction ([place]) 1990s   "after my jaw OR"  . Pedal edema    "not a lot", per pt.  . Peripheral vascular disease ([place])   . Presence of retained hardware 07/2015   failed hardware jaw  . Runny nose 07/27/2015   clear drainage, per pt.  . Type II diabetes mellitus ([place])    "diet controlled" (11/08/2015)    Assessment: 83 year old man with history of atrial fibrillation on Coumadin presenting with slurred speech, right-sided facial droop found to have a left MCA occlusion s/p left common carotid arteriogram followed by revascularization of left MCA M1 occlusion by VIR.  Pharmacy consulted to start heparin drip for now until oral anticoagulation can be restarted.  Home regimen: Warfarin 1 mg tabs -Take 2 tablets daily except 1 tablet on Mondays, Wednesdays and Fridays  Goal of Therapy:  Heparin level 0.3-0.7 units/ml Monitor platelets by anticoagulation protocol: Yes   Plan:  Start heparin drip at 1200 units/hr (no bolus) HL in 8 hours  Qam HL and CBC  Alanda Slim, PharmD, [facility] Clinical Pharmacist Please see AMION for all Pharmacists' Contact Phone Numbers 01/05/2019, 12:34 PM

## 2019-01-05 NOTE — Progress Notes (Signed)
STROKE TEAM PROGRESS NOTE   HISTORY OF PRESENT ILLNESS (per record) [name] is an 83 y.o. male who presented to [facility] ED in the late afternoon today with slurred speech first noticed at noon. LKN is the same as TOSO. When he arrived at the ED, right sided facial droop and slurred speech were noted. After Teleneurology evaluation, he was out of the time window for tPA. CTA of head and neck with CTP were obtained, revealing a left MCA occlusion and significant perfusion deficit in the left MCA territory of 95 cc. NIHSS was 10 at OSH. His mRS was rated as 0 in the context of a left AKA. He is on Coumadin for atrial fibrillation. His INR was 1.8.   LSN: 1200 tPA Given: No: Out of tPA time window   SUBJECTIVE (INTERVAL HISTORY) No family members present. The pt is intubated - alert - following commands.His arterial groin sheath is still in place since his INR was 1.8 on admission and follow-up INR from today is pending    OBJECTIVE Vitals:   01/05/19 0404 01/05/19 0500 01/05/19 0600 01/05/19 0700  BP:  117/67 (!) 124/59   Pulse:  (!) 50 (!) 55 (!) 51  Resp: 16 16 17 16   Temp: 97.6 F (36.4 C)     TempSrc: Axillary     SpO2: 100% 100% 100%   Weight:      Height:        CBC:  Recent Labs  Lab 01/04/19 1619 01/05/19 0611  WBC 14.0* 14.2*  NEUTROABS 11.6* 13.2*  HGB 11.9* 10.2*  HCT 36.5* 32.1*  MCV 94.3 93.3  PLT 369 888    Basic Metabolic Panel:  Recent Labs  Lab 01/04/19 1619 01/04/19 1644 01/05/19 0611  NA 138  --  137  K 3.9  --  4.0  CL 109  --  109  CO2 19*  --  19*  GLUCOSE 162*  --  150*  BUN 26*  --  23  CREATININE 1.25* 1.10 1.20  CALCIUM 8.6*  --  8.3*    Lipid Panel:     Component Value Date/Time   CHOL 90 01/05/2019 0611   TRIG 80 01/05/2019 0611   TRIG 80 01/05/2019 0611   HDL 30 (L) 01/05/2019 0611   CHOLHDL 3.0 01/05/2019 0611   VLDL 16 01/05/2019 0611   LDLCALC 44 01/05/2019 0611   HgbA1c:  Lab Results  Component Value Date    HGBA1C 6.3 (H) 01/05/2019   Urine Drug Screen:     Component Value Date/Time   LABOPIA NONE DETECTED 01/04/2019 2322   COCAINSCRNUR NONE DETECTED 01/04/2019 2322   LABBENZ NONE DETECTED 01/04/2019 2322   AMPHETMU NONE DETECTED 01/04/2019 2322   THCU NONE DETECTED 01/04/2019 2322   LABBARB NONE DETECTED 01/04/2019 2322    Alcohol Level     Component Value Date/Time   ETH <10 01/04/2019 1619    IMAGING   Ct Angio Head W Or Wo Contrast Ct Angio Neck W Or Wo Contrast Ct Cerebral Perfusion W Contrast 01/04/2019 IMPRESSION:  1. Positive for Left MCA M1 ELVO with favorable CT Perfusion parameters: - estimated core infarct 0-15 mL and Left MCA penumbra of at least 95 mL. HPI 0.2.  2. Other vascular findings include soft plaque at the left ICA origin. Atherosclerosis but no hemodynamically significant stenosis elsewhere in the head or neck.     Mr Brain Wo Contrast 01/05/2019 IMPRESSION:  1. Multifocal acute ischemia within the left MCA  distribution, predominantly located along the insula and basal ganglia. The area of infarction is much smaller than the area of at risk brain demonstrated on the earlier perfusion scan.  2. No mass effect or acute hemorrhage.  3. Chronic ischemic microangiopathy and old small vessel infarcts.     Dg Chest Port 1 View 01/04/2019 IMPRESSION:  Endotracheal tube tip just above the carina. Retraction of 5 cm is recommended.    Dg Abd Portable 1v 01/04/2019 IMPRESSION:  1. No feeding tube seen.  2. Cardiomegaly.     Ct Head Code Stroke Wo Contrast 01/04/2019   IMPRESSION:  1. Small age indeterminate small lacunar infarct in the ventral left thalamus.  2. No acute cortically based infarct identified. No acute intracranial hemorrhage, or mass effect.  3. ASPECTS is 10.    Cerebral Angiogram with Intervention - Dr Estanislado Pandy  01/04/2019 S/P : Lt common carotid arteriogram fiollowed by complete revascularization of Lt MCA M 1 occlusion with x 1  pass with 81m x 33 mm embotrap retriver device achieving a TICI revascularization    Transthoracic Echocardiogram  00/00/2020 Pending   EKG - atrial fibrillation - ventricular response 65 BPM. (See cardiology reading for complete details)    PHYSICAL EXAM Blood pressure (!) 124/59, pulse (!) 51, temperature 97.6 F (36.4 C), temperature source Axillary, resp. rate 16, height 5' 8"  (1.727 m), weight 79.8 kg, SpO2 100 %. Pleasant elderly Caucasian male who is intubated and not in distress. . Afebrile. Head is nontraumatic. Neck is supple without bruit.    Cardiac exam no murmur or gallop. Lungs are clear to auscultation. Distal pulses are well felt except in left lower extremity where.he has left below-knee amputation. He has arterial groin sheath in the left  Neurological exam :  He is intubated but awake alert and interactive. He follows commands well. Extraocular moments are full range without nystagmus. He blinks to threat bilaterally. Fundi not visualized. Pupils equal reactive. Face is symmetric without weakness. Tongue midline. Motor system exam is able to move all 4 extremities equally well against gravity. No focal weakness. Deep tendon pulses symmetric. Left plantar cannot be tested right is downgoing. Sensation appears intact bilaterally.     ASSESSMENT/PLAN Mr. [name] KARNERis a 83y.o. male with history of Lt AKA, CAD with MI, atrial fibrillation (coumadin), DM, ASPVD, and Htn, presenting with right sided facial droop and slurred speech. He did not receive IV t-PA due to anticoagulation. IR - Dr DEstanislado Pandycomplete revascularization of Lt MCA M 1 occlusion.  Stroke: left MCA distribution infarct - embolic - atrial fibrillation - subtherapeutic INR.  Resultant  No focal deficit likely but exam limited by intubation  CT head - Small age indeterminate small lacunar infarct in the ventral left thalamus.  MRI head - Multifocal acute ischemia within the left MCA distribution,  predominantly located along the insula and basal ganglia.   MRA head - not performed  CTA H&N -  soft plaque at the left ICA origin.  Carotid Doppler - CTA neck performed - carotid dopplers not indicated.  2D Echo - pending  LDL - 44  HgbA1c - 6.3  UDS - negative  VTE prophylaxis - SCDs  Diet - NPO  warfarin daily prior to admission, now on No antithrombotic  Ongoing aggressive stroke risk factor management  Therapy recommendations:  pending  Disposition:  Pending  Hypertension  Stable . Permissive hypertension (OK if < 220/120) but gradually normalize in 5-7 days . Long-term BP goal normotensive  Hyperlipidemia  Lipid lowering medication PTA: Mevacor 10 mg daily  LDL 44, goal < 70  Current lipid lowering medication: None  Lipitor and Simvastatin intolerance  Diabetes  HgbA1c 6.3, goal < 7.0  Controlled  Other Stroke Risk Factors  Advanced age  Coronary artery disease  Atrial fibrillation  Other Active Problems  Leukocytosis - 14. ->14.2 (afebrile)  Mild anemia - Hb - 11.9->10.2  UTI - culture - pending - penicillin allergy - sulfa allergy - Rx with IV Cipro (pt NPO)  Subtherapeutic coumadin - INR 1.7 -> IV heparin per pharmacy after sheath has been removed.  Per Dr Patrecia Pour ideally wait 8 hours after sheath is removed before starting IV Heparin.    Hospital day # 1  [name] Bussing PA-C [facility] Pager [phone] 01/05/2019, 10:17 AM I have personally obtained history,examined this patient, reviewed notes, independently viewed imaging studies, participated in medical decision making and plan of care.ROS completed by me personally and pertinent positives fully documented  I have made any additions or clarifications directly to the above note. Agree with note above. Presented with embolic left MCA infarct despite anticoagulation with warfarin with INR 1.8. He underwent successful mechanical thrombectomy with excellent  radiological and clinical outcome. Recommend strict blood pressure control as per post intervention protocol and close neurological monitoring. Discontinue groin arterial sheath when INR is coming down as per neuro interventional team. May need to start IV heparin till he can swallow and then switched to eliquis. No family available at the bedside for discussion. Discuss with critical care team and Dr. Estanislado Pandy This patient is critically ill and at significant risk of neurological worsening, death and care requires constant monitoring of vital signs, hemodynamics,respiratory and cardiac monitoring, extensive review of multiple databases, frequent neurological assessment, discussion with family, other specialists and medical decision making of high complexity.I have made any additions or clarifications directly to the above note.This critical care time does not reflect procedure time, or teaching time or supervisory time of PA/NP/Med Resident etc but could involve care discussion time.  I spent 35 minutes of neurocritical care time  in the care of  this patient.      Antony Contras, MD Medical Director [facility] Pager: [phone] 01/05/2019 10:59 AM   To contact Stroke Continuity provider, please refer to [email or web address] After hours, contact General Neurology

## 2019-01-05 NOTE — Progress Notes (Signed)
SLP Cancellation Note  Patient Details Name: [name] MRN: [id number] DOB: 1935/12/28   Cancelled treatment:       Reason Eval/Treat Not Completed: Medical issues which prohibited therapy  The patient is currently intubated.  ST will continue to follow in attempt to complete evaluation.   Shelly Flatten, MA, [employer] Acute Rehab SLP [phone]  Lamar Sprinkles 01/05/2019, 7:13 AM

## 2019-01-06 ENCOUNTER — Encounter (HOSPITAL_COMMUNITY): Payer: Self-pay | Admitting: Radiology

## 2019-01-06 DIAGNOSIS — I6602 Occlusion and stenosis of left middle cerebral artery: Secondary | ICD-10-CM

## 2019-01-06 LAB — GLUCOSE, CAPILLARY
GLUCOSE-CAPILLARY: 93 mg/dL (ref 70–99)
Glucose-Capillary: 99 mg/dL (ref 70–99)
Glucose-Capillary: 96 mg/dL (ref 70–99)
Glucose-Capillary: 112 mg/dL — ABNORMAL HIGH (ref 70–99)
Glucose-Capillary: 116 mg/dL — ABNORMAL HIGH (ref 70–99)
Glucose-Capillary: 106 mg/dL — ABNORMAL HIGH (ref 70–99)

## 2019-01-06 LAB — CBC
HCT: 29.7 % — ABNORMAL LOW (ref 39.0–52.0)
Hemoglobin: 9.6 g/dL — ABNORMAL LOW (ref 13.0–17.0)
MCH: 30.6 pg (ref 26.0–34.0)
MCHC: 32.3 g/dL (ref 30.0–36.0)
MCV: 94.6 fL (ref 80.0–100.0)
Platelets: 316 10*3/uL (ref 150–400)
RBC: 3.14 MIL/uL — ABNORMAL LOW (ref 4.22–5.81)
RDW: 15 % (ref 11.5–15.5)
WBC: 14.2 10*3/uL — ABNORMAL HIGH (ref 4.0–10.5)
nRBC: 0 % (ref 0.0–0.2)

## 2019-01-06 LAB — BASIC METABOLIC PANEL
Anion gap: 7 (ref 5–15)
BUN: 26 mg/dL — AB (ref 8–23)
CO2: 20 mmol/L — ABNORMAL LOW (ref 22–32)
Calcium: 8.2 mg/dL — ABNORMAL LOW (ref 8.9–10.3)
Chloride: 113 mmol/L — ABNORMAL HIGH (ref 98–111)
Creatinine, Ser: 1.21 mg/dL (ref 0.61–1.24)
GFR calc Af Amer: >60 mL/min (ref >60)
GFR calc non Af Amer: 56 mL/min — ABNORMAL LOW (ref >60)
Glucose, Bld: 119 mg/dL — ABNORMAL HIGH (ref 70–99)
Potassium: 4.3 mmol/L (ref 3.5–5.1)
Sodium: 140 mmol/L (ref 135–145)

## 2019-01-06 LAB — PROTIME-INR
INR: 1.7 — ABNORMAL HIGH (ref 0.8–1.2)
Prothrombin Time: 19.9 seconds — ABNORMAL HIGH (ref 11.4–15.2)

## 2019-01-06 LAB — HEPARIN LEVEL (UNFRACTIONATED)
Heparin Unfractionated: 0.65 IU/mL (ref 0.30–0.70)
Heparin Unfractionated: 0.55 IU/mL (ref 0.30–0.70)

## 2019-01-06 NOTE — Evaluation (Signed)
Clinical/Bedside Swallow Evaluation Patient Details  Name: [name] MRN: [id number] Date of Birth: 1935-12-02  Today's Date: 01/06/2019 Time: SLP Start Time (ACUTE ONLY): 0950 SLP Stop Time (ACUTE ONLY): 1002 SLP Time Calculation (min) (ACUTE ONLY): 12 min  Past Medical History:  Past Medical History:  Diagnosis Date  . Arteriosclerotic cardiovascular disease (ASCVD)    CABG in 1990s; 2002 total obstruction of LAD, CX and RCA with patent grafts and nl EF; Stress nuc. 2008 - mild LV dilation; normal EF; questionable small anteroapical scar; no ischemia  . Arthritis    "left knee" (11/08/2015)  . Atrial fibrillation ([place])   . Cellulitis 11/08/2015   "both feet"  . Chronic anticoagulation   . Dental crowns present   . Diabetic peripheral neuropathy (HCC)    bilateral lower leg, hands  . History of blood transfusion    "related to my leg surgery?"  . Hypertension    states under control with meds., has been on med. x "long time"  . Iron deficiency anemia   . Jaw cancer ([place]) 1990s   "squamous cell"  . Myocardial infarction ([place]) 1990s   "after my jaw OR"  . Pedal edema    "not a lot", per pt.  . Peripheral vascular disease ([place])   . Presence of retained hardware 07/2015   failed hardware jaw  . Runny nose 07/27/2015   clear drainage, per pt.  . Type II diabetes mellitus ([place])    "diet controlled" (11/08/2015)   Past Surgical History:  Past Surgical History:  Procedure Laterality Date  . AMPUTATION Left 11/11/2015   Procedure: LEFT ABOVE KNEE AMPUTATION;  Surgeon: Angelia Mould, MD;  Location: [place];  Service: Vascular;  Laterality: Left;  . CARDIAC CATHETERIZATION  1990s; 04/16/2001  . CATARACT EXTRACTION W/ INTRAOCULAR LENS  IMPLANT, BILATERAL Bilateral 09/2015  . COLONOSCOPY N/A 11/27/2014   Procedure: COLONOSCOPY;  Surgeon: Rogene Houston, MD;  [facility];  Service: Endoscopy;  Laterality: N/A;  830  . CORONARY ARTERY BYPASS GRAFT  1990's   "CABG  X4"  . ESOPHAGOGASTRODUODENOSCOPY N/A 02/14/2018   Procedure: ESOPHAGOGASTRODUODENOSCOPY (EGD);  Surgeon: Rogene Houston, MD;  [facility];  Service: Endoscopy;  Laterality: N/A;  . ESOPHAGOGASTRODUODENOSCOPY (EGD) WITH PROPOFOL N/A 02/25/2018   Procedure: ESOPHAGOGASTRODUODENOSCOPY (EGD) WITH PROPOFOL;  Surgeon: Rogene Houston, MD;  [facility];  Service: Endoscopy;  Laterality: N/A;  . FRACTURE SURGERY    . GIVENS CAPSULE STUDY N/A 02/15/2018   Procedure: GIVENS CAPSULE STUDY;  Surgeon: Rogene Houston, MD;  [facility];  Service: Endoscopy;  Laterality: N/A;  . GIVENS CAPSULE STUDY N/A 03/27/2018   Procedure: GIVENS CAPSULE STUDY;  Surgeon: Rogene Houston, MD;  [facility];  Service: Endoscopy;  Laterality: N/A;  . I&D EXTREMITY Left 11/10/2015   Procedure: INCISION AND DRAINAGE LEFT FOOT, AMPUTATION OF LEFT THIRD TOE;  Surgeon: Conrad [place], MD;  Location: [place];  Service: Vascular;  Laterality: Left;  . INCISION AND DRAINAGE ABSCESS Left 06/16/2005   wide exc. abscess 5th toe  . IR ANGIOGRAM VISCERAL SELECTIVE  03/28/2018  . IR ANGIOGRAM VISCERAL SELECTIVE  03/28/2018  . IR US GUIDE VASC ACCESS LEFT  03/28/2018  . MANDIBULAR HARDWARE REMOVAL Left 08/02/2015   Procedure:  HARDWARE REMOVAL TWO MANDIBULAR SCREWS;  Surgeon: Jannette Fogo, DDS;  Location: [place];  Service: Oral Surgery;  Laterality: Left;  . ORIF TIBIA FRACTURE Left ~ 1970  .  PERIPHERAL VASCULAR CATHETERIZATION N/A 08/14/2016   Procedure: Abdominal Aortogram w/Lower Extremity;  Surgeon: Angelia Mould, MD;  Location: [place] [facility];  Service: Cardiovascular;  Laterality: N/A;  . RADIOLOGY WITH ANESTHESIA N/A 01/04/2019   Procedure: CODE STROKE;  Surgeon: Radiologist, Medication, MD;  Location: [place];  Service: Radiology;  Laterality: N/A;  . SQUAMOUS CELL CARCINOMA EXCISION Left 1993   "took my jaw out; got cadavar in there now"  . TONSILLECTOMY     HPI:   [name] is an 83 y.o. male who presented ED with slurred speech, right sided facial droop. Intubated 3/14 for revascularization of left MCA M 1 occlusion with retriver device. Extubated 3/15. CTA of head and neck with CTP were obtained, revealing a left MCA occlusion and significant perfusion deficit in the left MCA territory of 95 cc. MRI multifocal acute ischemia within the left MCA distribution, predominantly located along the insula and basal ganglia, chronic ischemic microangiopathy and old small vessel infarcts. PMH: left AKA, DM, MI, jaw cancer in 1990's, A-fib   Assessment / Plan / Recommendation Clinical Impression  Pt's swallow assessed with positioning assist with RN placing additional pillows behind his back and bed in reverse Trendelenburg position as he has a femonral sheath in place and cannot bend hip at 90 degrees. 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 He is alert, oriented with functional cognitive status to aid in safety with suboptimal positioning. Vocal quality is normal with strong volitional cough following 2 day intubation. Delayed cough x 2 during multiple trials thin via straw, applesauce and solid (cracker) during duration of swallow assessment. Ordering regular texture with encouraging softer items until he is cleard to sit upright, thin liquids, pills whole in applesauce. Place pillows behing back/shoulders to promote upright position with bed in reverse Trendelenburg positioning. He will not require further ST at present.     SLP Visit Diagnosis: Dysphagia, unspecified (R13.10)    Aspiration Risk  Mild aspiration risk    Diet Recommendation Regular;Thin liquid   Liquid Administration via: Straw;Cup Medication Administration: Whole meds with puree Supervision: Patient able to self feed;Staff to assist with self feeding(due to positioning) Compensations: Slow rate;Small sips/bites Postural Changes: Seated upright at  90 degrees    Other  Recommendations Oral Care Recommendations: Oral care BID   Follow up Recommendations None      Frequency and Duration            Prognosis        Swallow Study   General HPI: [name] is an 83 y.o. male who presented ED with slurred speech, right sided facial droop. Intubated 3/14 for revascularization of left MCA M 1 occlusion with retriver device. Extubated 3/15. CTA of head and neck with CTP were obtained, revealing a left MCA occlusion and significant perfusion deficit in the left MCA territory of 95 cc. MRI multifocal acute ischemia within the left MCA distribution, predominantly located along the insula and basal ganglia, chronic ischemic microangiopathy and old small vessel infarcts. PMH: left AKA, DM, MI, jaw cancer in 1990's, A-fib Type of Study: Bedside Swallow Evaluation Previous Swallow Assessment: none Diet Prior to this Study: NPO Temperature Spikes Noted: No Respiratory Status: Nasal cannula History of Recent Intubation: Yes Length of Intubations (days): 2 days Date extubated: 01/05/19 Behavior/Cognition: Alert;Cooperative;Pleasant mood Oral Cavity Assessment: Within Functional Limits Oral Care Completed by SLP: Yes Oral Cavity - Dentition: Adequate natural dentition Vision: Functional for self-feeding  Self-Feeding Abilities: Needs assist;Needs set up Patient Positioning: Partially reclined(due to femoral sheath0 reverse Trendelenburg position) Baseline Vocal Quality: Normal Volitional Cough: Strong Volitional Swallow: Able to elicit    Oral/Motor/Sensory Function Overall Oral Motor/Sensory Function: Mild impairment(from surgery s/p mandible resection in 90's) Facial ROM: Reduced left Facial Symmetry: Abnormal symmetry left Facial Strength: Reduced left Facial Sensation: Reduced left Lingual ROM: Within Functional Limits Lingual Symmetry: Within Functional Limits Lingual Strength: Within Functional Limits Velum: Within Functional  Limits   Ice Chips Ice chips: Not tested   Thin Liquid Thin Liquid: Within functional limits Presentation: Straw    Nectar Thick Nectar Thick Liquid: Not tested   Honey Thick Honey Thick Liquid: Not tested   Puree Puree: Within functional limits   Solid     Solid: Within functional limits      Houston Siren 01/06/2019,10:15 AM  Orbie Pyo Colvin Caroli.Ed Risk analyst [phone] Office [phone]

## 2019-01-06 NOTE — Progress Notes (Signed)
ANTICOAGULATION CONSULT NOTE  Pharmacy Consult for Heparin Indication: atrial fibrillation  Patient Measurements: Height: 5' 8"  (172.7 cm) Weight: 176 lb (79.8 kg) IBW/kg (Calculated) : 68.4  Vital Signs: Temp: 97.8 F (36.6 C) (03/16 2000) Temp Source: Oral (03/16 2000) BP: 131/46 (03/16 2030) Pulse Rate: 59 (03/16 2030)  Labs: Recent Labs    01/04/19 1619 01/04/19 1644 01/05/19 0611 01/05/19 0938 01/05/19 2030 01/06/19 0537 01/06/19 1935  HGB 11.9*  --  10.2*  --   --  9.6*  --   HCT 36.5*  --  32.1*  --   --  29.7*  --   PLT 369  --  359  --   --  316  --   APTT 32  --   --   --   --   --   --   LABPROT 21.0*  --   --  20.2*  --  19.9*  --   INR 1.8*  --   --  1.7*  --  1.7*  --   HEPARINUNFRC  --   --   --   --  0.46 0.65 0.55  CREATININE 1.25* 1.10 1.20  --   --  1.21  --      Assessment: 83 year old man with history of atrial fibrillation on Coumadin presenting with slurred speech, right-sided facial droop found to have a left MCA occlusion s/p left common carotid arteriogram followed by revascularization of left MCA M1 occlusion by VIR.  Pharmacy consulted to start heparin drip for now until oral anticoagulation can be restarted.  Repeat HL is slightly supra-therapeutic.   Home warfarin regimen: Warfarin 1 mg tabs -Take 2 tablets daily except 1 tablet on Mondays, Wednesdays and Fridays  Goal of Therapy:  Heparin level 0.3-0.5 units/ml Monitor platelets by anticoagulation protocol: Yes   Plan:  Decrease heparin to 1050 units / hr Next HL with AM labs F/u neurosurgery plans for sheath removal   Harvel Quale 01/06/2019 9:08 PM

## 2019-01-06 NOTE — Evaluation (Signed)
Speech Language Pathology Evaluation Patient Details Name: [name] MRN: [id number] DOB: May 12, 1936 Today's Date: 01/06/2019 Time: 0900-0919 SLP Time Calculation (min) (ACUTE ONLY): 19 min  Problem List:  Patient Active Problem List   Diagnosis Date Noted  . Stroke ([place]) 01/04/2019  . Stroke (cerebrum) ([place]) 01/04/2019  . Middle cerebral artery embolism, left 01/04/2019  . Gastrointestinal hemorrhage   . AKI (acute kidney injury) ([place])   . Anemia 03/26/2018  . Melena 02/21/2018  . Upper GI bleed 02/13/2018  . Coagulopathy ([place]) 02/13/2018  . Acute renal failure superimposed on stage 3 chronic kidney disease ([place]) 02/13/2018  . Weight gain 11/30/2015  . Coronary artery disease involving coronary bypass graft of native heart without angina pectoris   . Tachycardia   . Leukocytosis   . Abnormality of gait   . Status post above knee amputation of left lower extremity   . Acute blood loss anemia   . Acute pulmonary edema (HCC)   . Acute congestive heart failure ([place])   . Acute respiratory failure with hypoxia ([place])   . Pressure ulcer 11/09/2015  . Necrotic toes ([place]) 11/08/2015  . Lower extremity cellulitis 11/08/2015  . Bradycardia 05/28/2015  . PVD (peripheral vascular disease) with claudication ([place]) 09/30/2014  . Encounter for therapeutic drug monitoring 11/26/2013  . Peripheral vascular disease ([place]) 08/06/2012  . Chronic anticoagulation 01/20/2011  . Diabetes mellitus ([place]) 03/04/2010  . Dyslipidemia 03/04/2010  . Essential hypertension 03/04/2010  . Hx of CABG 03/04/2010  . Permanent atrial fibrillation 04/08/2009   Past Medical History:  Past Medical History:  Diagnosis Date  . Arteriosclerotic cardiovascular disease (ASCVD)    CABG in 1990s; 2002 total obstruction of LAD, CX and RCA with patent grafts and nl EF; Stress nuc. 2008 - mild LV dilation; normal EF; questionable small anteroapical scar; no ischemia  . Arthritis    "left knee" (11/08/2015)  .  Atrial fibrillation ([place])   . Cellulitis 11/08/2015   "both feet"  . Chronic anticoagulation   . Dental crowns present   . Diabetic peripheral neuropathy (HCC)    bilateral lower leg, hands  . History of blood transfusion    "related to my leg surgery?"  . Hypertension    states under control with meds., has been on med. x "long time"  . Iron deficiency anemia   . 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 Myocardial infarction ([place]) 1990s   "after my jaw OR"  . Pedal edema    "not a lot", per pt.  . Peripheral vascular disease ([place])   . Presence of retained hardware 07/2015   failed hardware jaw  . Runny nose 07/27/2015   clear drainage, per pt.  . Type II diabetes mellitus ([place])    "diet controlled" (11/08/2015)   Past Surgical History:  Past Surgical History:  Procedure Laterality Date  . AMPUTATION Left 11/11/2015   Procedure: LEFT ABOVE KNEE AMPUTATION;  Surgeon: Angelia Mould, MD;  Location: [facility];  Service: Vascular;  Laterality: Left;  . CARDIAC CATHETERIZATION  1990s; 04/16/2001  . CATARACT EXTRACTION W/ INTRAOCULAR LENS  IMPLANT, BILATERAL Bilateral 09/2015  . COLONOSCOPY N/A 11/27/2014   Procedure: COLONOSCOPY;  Surgeon: Rogene Houston, MD;  [facility];  Service: Endoscopy;  Laterality: N/A;  830  . CORONARY ARTERY BYPASS GRAFT  1990's   "CABG X4"  . ESOPHAGOGASTRODUODENOSCOPY N/A 02/14/2018   Procedure: ESOPHAGOGASTRODUODENOSCOPY (EGD);  Surgeon: Rogene Houston, MD;  [facility];  Service:  Endoscopy;  Laterality: N/A;  . ESOPHAGOGASTRODUODENOSCOPY (EGD) WITH PROPOFOL N/A 02/25/2018   Procedure: ESOPHAGOGASTRODUODENOSCOPY (EGD) WITH PROPOFOL;  Surgeon: Rogene Houston, MD;  [facility];  Service: Endoscopy;  Laterality: N/A;  . FRACTURE SURGERY    . GIVENS CAPSULE STUDY N/A 02/15/2018   Procedure: GIVENS CAPSULE STUDY;  Surgeon: Rogene Houston, MD;  [facility];  Service: Endoscopy;  Laterality: N/A;  . GIVENS  CAPSULE STUDY N/A 03/27/2018   Procedure: GIVENS CAPSULE STUDY;  Surgeon: Rogene Houston, MD;  [facility];  Service: Endoscopy;  Laterality: N/A;  . I&D EXTREMITY Left 11/10/2015   Procedure: INCISION AND DRAINAGE LEFT FOOT, AMPUTATION OF LEFT THIRD TOE;  Surgeon: Conrad Dagsboro, MD;  Location: [facility];  Service: Vascular;  Laterality: Left;  . INCISION AND DRAINAGE ABSCESS Left 06/16/2005   wide exc. abscess 5th toe  . IR ANGIOGRAM VISCERAL SELECTIVE  03/28/2018  . IR ANGIOGRAM VISCERAL SELECTIVE  03/28/2018  . IR US GUIDE VASC ACCESS LEFT  03/28/2018  . MANDIBULAR HARDWARE REMOVAL Left 08/02/2015   Procedure:  HARDWARE REMOVAL TWO MANDIBULAR SCREWS;  Surgeon: Jannette Fogo, DDS;  Location: [facility];  Service: Oral Surgery;  Laterality: Left;  . ORIF TIBIA FRACTURE Left ~ 1970  . PERIPHERAL VASCULAR CATHETERIZATION N/A 08/14/2016   Procedure: Abdominal Aortogram w/Lower Extremity;  Surgeon: Angelia Mould, MD;  Location: [facility];  Service: Cardiovascular;  Laterality: N/A;  . RADIOLOGY WITH ANESTHESIA N/A 01/04/2019   Procedure: CODE STROKE;  Surgeon: Radiologist, Medication, MD;  Location: [facility];  Service: Radiology;  Laterality: N/A;  . SQUAMOUS CELL CARCINOMA EXCISION Left 1993   "took my jaw out; got cadavar in there now"  . TONSILLECTOMY     HPI:  [name] is an 83 y.o. male who presented ED with slurred speech, right sided facial droop. Intubated 3/14 for revascularization of left MCA M 1 occlusion with retriver device. Extubated 3/15. CTA of head and neck with CTP were obtained, revealing a left MCA occlusion and significant perfusion deficit in the left MCA territory of 95 cc. MRI multifocal acute ischemia within the left MCA distribution, predominantly located along the insula and basal ganglia, chronic ischemic microangiopathy and old small vessel infarcts. PMH: left AKA, DM, MI, jaw cancer in 1990's, A-fib   Assessment / Plan /  Recommendation Clinical Impression  Pt was given the MOCA, blind version, not due to visual disturbance but he has a femoral sheath in place and cannot sit upright to complete. He scored 15 out of possible 25 with deficits in working memory, divergent naming and attention. He had premorbid permanent left facial weakness and poor sensation due to surgery for mandible cancer. He is alert, oriented and functional for problem solving pertaining to acute care venue. Reports his wife is responsible for finances and speech is 100% intelligible. Presently he does not require ST in acute venue. Educated pt to continue to use calendar to recall important events and write all information. Educated that outpatient ST is available if he notieces cognitive difficulty once home.      SLP Assessment  SLP Recommendation/Assessment: Patient does not need any further Speech Lanaguage Pathology Services SLP Visit Diagnosis: Cognitive communication deficit (R41.841)    Follow Up Recommendations  None    Frequency and Duration           SLP Evaluation Cognition  Overall Cognitive Status: Impaired/Different from baseline Arousal/Alertness: Awake/alert Orientation Level: Oriented  to person;Oriented to place;Oriented to situation;Oriented to time Attention: Sustained Sustained Attention: Appears intact Memory: Impaired Memory Impairment: Retrieval deficit;Storage deficit Awareness: Appears intact Problem Solving: Appears intact Safety/Judgment: Appears intact       Comprehension  Auditory Comprehension Overall Auditory Comprehension: Appears within functional limits for tasks assessed Visual Recognition/Discrimination Discrimination: Not tested Reading Comprehension Reading Status: Not tested    Expression Expression Primary Mode of Expression: Verbal Verbal Expression Overall Verbal Expression: Appears within functional limits for tasks assessed Written Expression Dominant Hand: Left Written  Expression: Not tested   Oral / Motor  Oral Motor/Sensory Function Overall Oral Motor/Sensory Function: Mild impairment(d/t previous surgery for jaw cancer) Facial ROM: Reduced left Facial Symmetry: Abnormal symmetry left Facial Strength: Reduced left Facial Sensation: Reduced left Lingual ROM: Within Functional Limits Lingual Symmetry: Within Functional Limits Lingual Strength: Within Functional Limits Velum: Within Functional Limits Motor Speech Overall Motor Speech: Appears within functional limits for tasks assessed Intelligibility: Intelligible Motor Planning: Witnin functional limits   GO                    Houston Siren 01/06/2019, 9:48 AM   Orbie Pyo Colvin Caroli.Ed Risk analyst [phone] Office [phone]

## 2019-01-06 NOTE — Progress Notes (Signed)
[facility] for Heparin Indication: atrial fibrillation  Allergies  Allergen Reactions  . Lipitor [Atorvastatin] Other (See Comments)    SEVERE HEADACHE  . Simvastatin Other (See Comments)    MUSCLE ACHES  . Penicillins Rash    Has patient had a PCN reaction causing immediate rash, facial/tongue/throat swelling, SOB or lightheadedness with hypotension: Yes Has patient had a PCN reaction causing severe rash involving mucus membranes or skin necrosis: No Has patient had a PCN reaction that required hospitalization No Has patient had a PCN reaction occurring within the last 10 years: No If all of the above answers are "NO", then may proceed with Cephalosporin use.   . Sulfa Antibiotics Rash    Tolerates silver sulfadiazine cream at home    Patient Measurements: Height: 5' 8"  (172.7 cm) Weight: 176 lb (79.8 kg) IBW/kg (Calculated) : 68.4  Vital Signs: Temp: 97.6 F (36.4 C) (03/16 0330) Temp Source: Oral (03/16 0330) BP: 135/70 (03/16 1100) Pulse Rate: 54 (03/16 1100)  Labs: Recent Labs    01/04/19 1619 01/04/19 1644 01/05/19 0611 01/05/19 0938 01/05/19 2030 01/06/19 0537  HGB 11.9*  --  10.2*  --   --  9.6*  HCT 36.5*  --  32.1*  --   --  29.7*  PLT 369  --  359  --   --  316  APTT 32  --   --   --   --   --   LABPROT 21.0*  --   --  20.2*  --  19.9*  INR 1.8*  --   --  1.7*  --  1.7*  HEPARINUNFRC  --   --   --   --  0.46 0.65  CREATININE 1.25* 1.10 1.20  --   --  1.21    Estimated Creatinine Clearance: 47.1 mL/min (by C-G formula based on SCr of 1.21 mg/dL).    Assessment: 83 year old man with history of atrial fibrillation on Coumadin presenting with slurred speech, right-sided facial droop found to have a left MCA occlusion s/p left common carotid arteriogram followed by revascularization of left MCA M1 occlusion by VIR.  Pharmacy consulted to start heparin drip for now until oral anticoagulation can be  restarted.  Repeat HL is 0.65 today which is slightly supra-therapeutic. INR remains unchanged at 1.7. Per IR, sheath to remain until INR < 1.5.   Home regimen: Warfarin 1 mg tabs -Take 2 tablets daily except 1 tablet on Mondays, Wednesdays and Fridays  Goal of Therapy:  Heparin level 0.3-0.5 units/ml Monitor platelets by anticoagulation protocol: Yes   Plan:  Decrease heparin to 1150 units / hr Qam HL and CBC F/u neurosurgery plans for sheath removal. Heparin will need to be held prior to removal   Albertina Parr, PharmD., BCPS Clinical Pharmacist Clinical phone for 01/06/19 until 3:30pm: [phone] If after 3:30pm, please refer to [facility] for unit-specific pharmacist

## 2019-01-06 NOTE — Progress Notes (Signed)
NAME:  [name], MRN:  [id number], DOB:  August 10, 1936, LOS: 2 ADMISSION DATE:  01/04/2019, CONSULTATION DATE:  3/14 REFERRING MD:  Cheral Marker MD, CHIEF COMPLAINT:  stroke  Brief History   83 year old man with history of atrial fibrillation on Coumadin, CAD status post CABG in 1990s, hypertension, DM 2, PVD, left AKA presenting with slurred speech, right-sided facial droop found to have a left MCA occlusion s/p left common carotid arteriogram followed by revascularization of left MCA M1 occlusion by VIR.   Past Medical History  atrial fibrillation on Coumadin, CAD status post CABG in 1990s, hypertension, DM 2, squamous cell jaw cancer, PVD, left AKA  Significant Hospital Events   3/14 Admit, VIR procedure 3/15: Passed spontaneous breathing trial, extubated  Consults:  VIR  Procedures:  3/14 left common carotid arteriogram followed by revascularization of left MCA M1 occlusion by VIR  Significant Diagnostic Tests:  AXR 3/14> Orogastric or nasogastric tube in place with its tip and side hole in the proximal stomach. CTA head, neck 3/14> Positive for Left MCA M1 ELVO with favorable CT Perfusion CT head w/o contrast 3/14> Small age indeterminate small lacunar infarct in the ventral left thalamus. No acute cortically based infarct identified. No acute intracranial hemorrhage, or mass effect. MRI brain 3/15 > acute ischemia within left MCA distribution.  No mass effect or acute hemorrhage.  Chronic ischemic microangiopathy and old small vessel infarcts.  Micro Data:  MRSA PCR>  Antimicrobials:  None  Interim history/subjective:  No acute events.  HD stable. Awaiting sheath removal.  Objective   Blood pressure 135/70, pulse (!) 54, temperature 97.6 F (36.4 C), temperature source Oral, resp. rate 14, height 5' 8"  (1.727 m), weight 79.8 kg, SpO2 100 %.        Intake/Output Summary (Last 24 hours) at 01/06/2019 1115 Last data filed at 01/06/2019 1000 Gross per 24 hour  Intake  1144.7 ml  Output 1500 ml  Net -355.3 ml   Filed Weights   01/04/19 1618  Weight: 79.8 kg    Examination: General: Adult male, resting in bed, in NAD.  Wife at bedside. Neuro: A&O x 3, MAE's. HEENT: [place]/AT. Sclerae anicteric, EOMI. Cardiovascular: IRIR, no M/R/G.  Lungs: Respirations even and unlabored.  CTA bilaterally, No W/R/R. Abdomen: BS x 4, soft, NT/ND.  Musculoskeletal: No gross deformities, no edema.  Skin: Intact, warm, no rashes.  Assessment & Plan:  83 year old man with history of atrial fibrillation on Coumadin, CAD status post CABG in 1990s, hypertension, DM 2, PVD, left AKA presenting with slurred speech, right-sided facial droop found to have a left MCA occlusion s/p left common carotid arteriogram followed by revascularization of left MCA M1 occlusion by VIR.  Acute MCA CVA, L MCA occlusion s/p thrombectomy 3/14, HTN: Leukocytosis of 14, likely reactive. Plan Neurology managing / following  Acute respiratory failure due to acute encephalopathy and need for airway protection - s/p extubation 3/15. Plan No interventions required  Atrial fibrillation: On Coumadin.  INR subtherapeutic at 1.8 on admission. Hold Coumadin for now. Plan Holding beta-blocker Heparin in lieu of preadmission warfarin - have asked RN to contact IR and ask when sheath will be removed as heparin will need to be held for 1 - 2 hours prior to this Holding warfarin given need to get sheath out  DM:  Plan  Sliding scale and insulin   Chronic anemia: Hemoglobin 11.9.  Baseline around 10 Plan Trend CBC Transfuse for hemoglobin less than 7 or for evidence of acute bleeding  Best practice:  Diet: Carb mod Pain/Anxiety/Delirium protocol (if indicated): N/A VAP protocol (if indicated): N/A DVT prophylaxis: SCD GI prophylaxis: PPI Glucose control: SSI Mobility: PT/OT Code Status: Full Family Communication: Wife updated at bedside. Disposition: ICU for now under neurology.    Nothing  further to add.  PCCM will sign off.  Please do not hesitate to call us back if we can be of any further assistance.   Critical care time: 30 min     Montey Hora, PA - [facility] Pager: [phone].  If no answer, [phone] 01/06/2019, 11:23 AM

## 2019-01-06 NOTE — Progress Notes (Signed)
OT Cancellation Note  Patient Details Name: [name] MRN: [id number] DOB: 11/02/1935   Cancelled Treatment:    Reason Eval/Treat Not Completed: Patient not medically ready.  Pt with INR 1.7 and sheath to remain in place until INR  <1.5.   Will reattempt.  Lucille Passy, OTR/L Acute Rehabilitation Services Pager [phone] Office [place], [place] 01/06/2019, 12:32 PM

## 2019-01-06 NOTE — Progress Notes (Signed)
STROKE TEAM PROGRESS NOTE    SUBJECTIVE (INTERVAL HISTORY)  invalid extubated yesterday and has done well. His able to speak and has no focal deficits. His wife is at the bedside.His arterial groin sheath is still in place since his INR was 1.8 on admission and follow-up INR from today is yet 1.7. He has been seen by speech therapy and cleared for diet    OBJECTIVE Vitals:   01/06/19 1200 01/06/19 1230 01/06/19 1300 01/06/19 1330  BP: 135/60 (!) 149/58 132/83 127/66  Pulse: (!) 50 (!) 58 (!) 59 62  Resp: 15 19 18 16   Temp: 97.7 F (36.5 C)     TempSrc: Oral     SpO2: 98% 94% 93% 98%  Weight:      Height:        CBC:  Recent Labs  Lab 01/04/19 1619 01/05/19 0611 01/06/19 0537  WBC 14.0* 14.2* 14.2*  NEUTROABS 11.6* 13.2*  --   HGB 11.9* 10.2* 9.6*  HCT 36.5* 32.1* 29.7*  MCV 94.3 93.3 94.6  PLT 369 359 382    Basic Metabolic Panel:  Recent Labs  Lab 01/05/19 0611 01/06/19 0537  NA 137 140  K 4.0 4.3  CL 109 113*  CO2 19* 20*  GLUCOSE 150* 119*  BUN 23 26*  CREATININE 1.20 1.21  CALCIUM 8.3* 8.2*    Lipid Panel:     Component Value Date/Time   CHOL 90 01/05/2019 0611   TRIG 80 01/05/2019 0611   TRIG 80 01/05/2019 0611   HDL 30 (L) 01/05/2019 0611   CHOLHDL 3.0 01/05/2019 0611   VLDL 16 01/05/2019 0611   LDLCALC 44 01/05/2019 0611   HgbA1c:  Lab Results  Component Value Date   HGBA1C 6.3 (H) 01/05/2019   Urine Drug Screen:     Component Value Date/Time   LABOPIA NONE DETECTED 01/04/2019 2322   COCAINSCRNUR NONE DETECTED 01/04/2019 2322   LABBENZ NONE DETECTED 01/04/2019 2322   AMPHETMU NONE DETECTED 01/04/2019 2322   THCU NONE DETECTED 01/04/2019 2322   LABBARB NONE DETECTED 01/04/2019 2322    Alcohol Level     Component Value Date/Time   ETH <10 01/04/2019 1619    IMAGING   Ct Angio Head W Or Wo Contrast Ct Angio Neck W Or Wo Contrast Ct Cerebral Perfusion W Contrast 01/04/2019 IMPRESSION:  1. Positive for Left MCA M1 ELVO with  favorable CT Perfusion parameters: - estimated core infarct 0-15 mL and Left MCA penumbra of at least 95 mL. HPI 0.2.  2. Other vascular findings include soft plaque at the left ICA origin. Atherosclerosis but no hemodynamically significant stenosis elsewhere in the head or neck.     Mr Brain Wo Contrast 01/05/2019 IMPRESSION:  1. Multifocal acute ischemia within the left MCA distribution, predominantly located along the insula and basal ganglia. The area of infarction is much smaller than the area of at risk brain demonstrated on the earlier perfusion scan.  2. No mass effect or acute hemorrhage.  3. Chronic ischemic microangiopathy and old small vessel infarcts.     Dg Chest Port 1 View 01/04/2019 IMPRESSION:  Endotracheal tube tip just above the carina. Retraction of 5 cm is recommended.    Dg Abd Portable 1v 01/04/2019 IMPRESSION:  1. No feeding tube seen.  2. Cardiomegaly.     Ct Head Code Stroke Wo Contrast 01/04/2019   IMPRESSION:  1. Small age indeterminate small lacunar infarct in the ventral left thalamus.  2. No acute cortically based infarct identified.  No acute intracranial hemorrhage, or mass effect.  3. ASPECTS is 10.    Cerebral Angiogram with Intervention - Dr Estanislado Pandy  01/04/2019 S/P : Lt common carotid arteriogram fiollowed by complete revascularization of Lt MCA M 1 occlusion with x 1 pass with 2m x 33 mm embotrap retriver device achieving a TICI revascularization    Transthoracic Echocardiogram  The left ventricle has a visually estimated ejection fraction of 55%. The cavity size was normal. Left ventricular diastolic function could not be evaluated secondary to atrial fibrillation.   EKG - atrial fibrillation - ventricular response 65 BPM. (See cardiology reading for complete details)    PHYSICAL EXAM Blood pressure 127/66, pulse 62, temperature 97.7 F (36.5 C), temperature source Oral, resp. rate 16, height 5' 8"  (1.727 m), weight 79.8 kg, SpO2  98 %. Pleasant elderly Caucasian male who is intubated and not in distress. . Afebrile. Head is nontraumatic. Neck is supple without bruit.    Cardiac exam no murmur or gallop. Lungs are clear to auscultation. Distal pulses are well felt except in left lower extremity where.he has left below-knee amputation. He has arterial groin sheath in the left  Neurological Exam :  He is  awake alert and interactive. He follows commands well. Extraocular movements are full range without nystagmus. He blinks to threat bilaterally. Fundi not visualized. Pupils equal reactive. Face is symmetric without weakness. Tongue midline. Motor system exam is able to move all 4 extremities equally well against gravity. No focal weakness. Deep tendon reflexes symmetric. Left plantar cannot be tested right is downgoing. Sensation appears intact bilaterally.   NIH stroke scale 0 Premorbid modified Rankin 2  ASSESSMENT/PLAN Mr. PARMONIE STATENis a 83y.o. male with history of Lt AKA, CAD with MI, atrial fibrillation (coumadin), DM, ASPVD, and Htn, presenting with right sided facial droop and slurred speech. He did not receive IV t-PA due to anticoagulation. IR - Dr DEstanislado Pandycomplete revascularization of Lt MCA M 1 occlusion.  Stroke: left MCA distribution infarct - embolic - atrial fibrillation - subtherapeutic INR.  Resultant  No focal deficit likely but exam limited by intubation  CT head - Small age indeterminate small lacunar infarct in the ventral left thalamus.  MRI head - Multifocal acute ischemia within the left MCA distribution, predominantly located along the insula and basal ganglia.   MRA head - not performed  CTA H&N -  soft plaque at the left ICA origin.  Carotid Doppler - CTA neck performed - carotid dopplers not indicated.  2D Echo - normal ejection fraction 55%. No clot  LDL - 44  HgbA1c - 6.3  UDS - negative  VTE prophylaxis - SCDs  Diet - NPO  warfarin daily prior to admission, now on No  antithrombotic  Ongoing aggressive stroke risk factor management  Therapy recommendations:  pending  Disposition:  Pending  Hypertension  Stable . Permissive hypertension (OK if < 220/120) but gradually normalize in 5-7 days . Long-term BP goal normotensive  Hyperlipidemia  Lipid lowering medication PTA: Mevacor 10 mg daily  LDL 44, goal < 70  Current lipid lowering medication: None  Lipitor and Simvastatin intolerance  Diabetes  HgbA1c 6.3, goal < 7.0  Controlled  Other Stroke Risk Factors  Advanced age  Coronary artery disease  Atrial fibrillation  Other Active Problems  Leukocytosis - 14. ->14.2 (afebrile)  Mild anemia - Hb - 11.9->10.2  UTI - culture - pending - penicillin allergy - sulfa allergy - Rx with IV Cipro (pt NPO)  Subtherapeutic coumadin - INR 1.7 -> IV heparin per pharmacy after sheath has been removed.  Per Dr Patrecia Pour ideally wait 8 hours after sheath is removed before starting IV Heparin.    Hospital day # 2   He presented with embolic left MCA infarct despite anticoagulation with warfarin with INR 1.8. He underwent successful mechanical thrombectomy with excellent radiological and clinical outcome. Recommend strict blood pressure control as per post intervention protocol and close neurological monitoring. Discontinue groin arterial sheath when INR is coming down as per neurointerventional team. May need to start IV heparin till he can swallow and then switched to eliquis.Marland Kitchenstart diet but will have to keep his leg flat due to groin sheath is discontinued. Continue IV heparin till one hour before removing groin sheath and was switched to eliquis after groin sheath is discontinued. Patient is interested in participating in sleep smart trial and given information to review and decide. Discussed with patient and wife and answered questions.. Discuss with critical care team and Dr. Estanislado Pandy This patient is critically ill and at significant risk of  neurological worsening, death and care requires constant monitoring of vital signs, hemodynamics,respiratory and cardiac monitoring, extensive review of multiple databases, frequent neurological assessment, discussion with family, other specialists and medical decision making of high complexity.I have made any additions or clarifications directly to the above note.This critical care time does not reflect procedure time, or teaching time or supervisory time of PA/NP/Med Resident etc but could involve care discussion time.  I spent 35 minutes of neurocritical care time  in the care of  this patient.      Antony Contras, MD Medical Director [facility] Stroke Center Pager: [phone] 01/06/2019 2:42 PM   To contact Stroke Continuity provider, please refer to [email or web address] After hours, contact General Neurology

## 2019-01-06 NOTE — Progress Notes (Signed)
PT Cancellation Note  Patient Details Name: [name] MRN: [id number] DOB: 11-Jan-1936   Cancelled Treatment:    Reason Eval/Treat Not Completed: Patient not medically ready. Pt continues to have sheath in, therefore is on bedrest. Per Dr. Estanislado Pandy, sheath to remain in until INR 1.5. Today INR is 1.7. PT to return as able to complete PT eval.  Kittie Plater, PT, DPT Acute Rehabilitation Services Pager #: [phone] Office #: [phone]    [name] 01/06/2019, 6:40 AM

## 2019-01-07 ENCOUNTER — Encounter (HOSPITAL_COMMUNITY): Payer: Self-pay | Admitting: Interventional Radiology

## 2019-01-07 LAB — CBC
HCT: 28.7 % — ABNORMAL LOW (ref 39.0–52.0)
Hemoglobin: 9.3 g/dL — ABNORMAL LOW (ref 13.0–17.0)
MCH: 30.9 pg (ref 26.0–34.0)
MCHC: 32.4 g/dL (ref 30.0–36.0)
MCV: 95.3 fL (ref 80.0–100.0)
Platelets: 283 10*3/uL (ref 150–400)
RBC: 3.01 MIL/uL — ABNORMAL LOW (ref 4.22–5.81)
RDW: 14.9 % (ref 11.5–15.5)
WBC: 12.2 10*3/uL — ABNORMAL HIGH (ref 4.0–10.5)
nRBC: 0 % (ref 0.0–0.2)

## 2019-01-07 LAB — BASIC METABOLIC PANEL
Anion gap: 6 (ref 5–15)
BUN: 19 mg/dL (ref 8–23)
CHLORIDE: 114 mmol/L — AB (ref 98–111)
CO2: 19 mmol/L — ABNORMAL LOW (ref 22–32)
Calcium: 8.1 mg/dL — ABNORMAL LOW (ref 8.9–10.3)
Creatinine, Ser: 1.07 mg/dL (ref 0.61–1.24)
GFR calc Af Amer: >60 mL/min (ref >60)
GFR calc non Af Amer: >60 mL/min (ref >60)
Glucose, Bld: 116 mg/dL — ABNORMAL HIGH (ref 70–99)
Potassium: 3.7 mmol/L (ref 3.5–5.1)
Sodium: 139 mmol/L (ref 135–145)

## 2019-01-07 LAB — GLUCOSE, CAPILLARY
Glucose-Capillary: 100 mg/dL — ABNORMAL HIGH (ref 70–99)
Glucose-Capillary: 106 mg/dL — ABNORMAL HIGH (ref 70–99)
Glucose-Capillary: 127 mg/dL — ABNORMAL HIGH (ref 70–99)
Glucose-Capillary: 131 mg/dL — ABNORMAL HIGH (ref 70–99)
Glucose-Capillary: 91 mg/dL (ref 70–99)
Glucose-Capillary: 95 mg/dL (ref 70–99)

## 2019-01-07 LAB — PROTIME-INR
INR: 1.7 — ABNORMAL HIGH (ref 0.8–1.2)
Prothrombin Time: 19.6 seconds — ABNORMAL HIGH (ref 11.4–15.2)

## 2019-01-07 LAB — URINE CULTURE

## 2019-01-07 LAB — TRIGLYCERIDES: Triglycerides: 116 mg/dL (ref <150)

## 2019-01-07 LAB — HEPARIN LEVEL (UNFRACTIONATED): Heparin Unfractionated: 0.4 IU/mL (ref 0.30–0.70)

## 2019-01-07 MED ORDER — APIXABAN 5 MG PO TABS
5.0000 mg | ORAL_TABLET | Freq: Two times a day (BID) | ORAL | Status: DC
  Administered 2019-01-07 – 2019-01-08 (×2): 5 mg via ORAL
  Filled 2019-01-07: qty 1

## 2019-01-07 MED ORDER — APIXABAN 5 MG PO TABS
5.0000 mg | ORAL_TABLET | Freq: Two times a day (BID) | ORAL | Status: DC
  Filled 2019-01-07: qty 1

## 2019-01-07 MED ORDER — HEPARIN (PORCINE) 25000 UT/250ML-% IV SOLN: 1050.0000 [IU]/h | INTRAVENOUS | Status: DC

## 2019-01-07 NOTE — Progress Notes (Signed)
ANTICOAGULATION CONSULT NOTE  Pharmacy Consult for Heparin Indication: atrial fibrillation  Patient Measurements: Height: 5' 8"  (172.7 cm) Weight: 176 lb (79.8 kg) IBW/kg (Calculated) : 68.4  Vital Signs: Temp: 98.4 F (36.9 C) (03/17 1200) Temp Source: Oral (03/17 1200) BP: 129/70 (03/17 1300) Pulse Rate: 64 (03/17 1300)  Labs: Recent Labs    01/04/19 1619  01/05/19 0611 01/05/19 0938  01/06/19 0537 01/06/19 1935 01/07/19 0543  HGB 11.9*  --  10.2*  --   --  9.6*  --  9.3*  HCT 36.5*  --  32.1*  --   --  29.7*  --  28.7*  PLT 369  --  359  --   --  316  --  283  APTT 32  --   --   --   --   --   --   --   LABPROT 21.0*  --   --  20.2*  --  19.9*  --  19.6*  INR 1.8*  --   --  1.7*  --  1.7*  --  1.7*  HEPARINUNFRC  --   --   --   --    < > 0.65 0.55 0.40  CREATININE 1.25*   < > 1.20  --   --  1.21  --  1.07   < > = values in this interval not displayed.     Assessment: 83 year old man with history of atrial fibrillation on Coumadin presenting with slurred speech, right-sided facial droop found to have a left MCA occlusion s/p left common carotid arteriogram followed by revascularization of left MCA M1 occlusion by VIR.  Pharmacy consulted to start heparin drip until oral anticoagulation can be restarted, sheath pulled > now changing to Eliquis.   Goal of Therapy:  Heparin level 0.3-0.5 units/ml Monitor platelets by anticoagulation protocol: Yes   Plan:  -Eliquis 5 mg po bid   Harvel Quale 01/07/2019 2:43 PM

## 2019-01-07 NOTE — Progress Notes (Addendum)
ANTICOAGULATION CONSULT NOTE  Pharmacy Consult for Heparin Indication: atrial fibrillation  Patient Measurements: Height: 5' 8"  (172.7 cm) Weight: 176 lb (79.8 kg) IBW/kg (Calculated) : 68.4  Vital Signs: Temp: 98 F (36.7 C) (03/17 0800) Temp Source: Oral (03/17 0800) BP: 135/81 (03/17 0900) Pulse Rate: 69 (03/17 0900)  Labs: Recent Labs    01/04/19 1619  01/05/19 0611 01/05/19 0938  01/06/19 0537 01/06/19 1935 01/07/19 0543  HGB 11.9*  --  10.2*  --   --  9.6*  --  9.3*  HCT 36.5*  --  32.1*  --   --  29.7*  --  28.7*  PLT 369  --  359  --   --  316  --  283  APTT 32  --   --   --   --   --   --   --   LABPROT 21.0*  --   --  20.2*  --  19.9*  --  19.6*  INR 1.8*  --   --  1.7*  --  1.7*  --  1.7*  HEPARINUNFRC  --   --   --   --    < > 0.65 0.55 0.40  CREATININE 1.25*   < > 1.20  --   --  1.21  --  1.07   < > = values in this interval not displayed.     Assessment: 83 year old man with history of atrial fibrillation on Coumadin presenting with slurred speech, right-sided facial droop found to have a left MCA occlusion s/p left common carotid arteriogram followed by revascularization of left MCA M1 occlusion by VIR.  Pharmacy consulted to start heparin drip for now until oral anticoagulation can be restarted.  HL this AM was therapeutic on 1050 units/hr of IV heparin   Home warfarin regimen: Warfarin 1 mg tabs -Take 2 tablets daily except 1 tablet on Mondays, Wednesdays and Fridays  Goal of Therapy:  Heparin level 0.3-0.5 units/ml Monitor platelets by anticoagulation protocol: Yes   Plan:  Heparin on hold for sheath removal today  F/u [facility] plans after sheath removal    Albertina Parr, PharmD., BCPS Clinical Pharmacist Clinical phone for 01/07/19 until 3:30pm: [phone] If after 3:30pm, please refer to [facility] for unit-specific pharmacist  Addendum: Femoral sheath has been pulled. Per IR, heparin to resume at 1445 today. Will resume IV heparin at previous  dose of 1050 units/hr without bolus. F/u 8 hr HL  Albertina Parr, PharmD., BCPS Clinical Pharmacist

## 2019-01-07 NOTE — Progress Notes (Signed)
PT Cancellation Note  Patient Details Name: [name] MRN: [id number] DOB: [birth year]   Cancelled Treatment:    Reason Eval/Treat Not Completed: Patient not medically ready. Pt's INR remains at 1.7 this morning and L groin sheath remains in place and continues to be on bedrest. Acute PT to return as able once activity orders progressed to OOB.  Kittie Plater, PT, DPT Acute Rehabilitation Services Pager #: [phone] Office #: [phone]    [name] 01/07/2019, 7:35 AM

## 2019-01-07 NOTE — Progress Notes (Signed)
OT Cancellation Note  Patient Details Name: [name] MRN: [id number] DOB: Sep 10, 1936   Cancelled Treatment:    Reason Eval/Treat Not Completed: Patient not medically ready.  Pt on bedrest.  Sheath pulled this am.  Will check back.  Lucille Passy, OTR/L Acute Rehabilitation Services Pager [phone] Office [place], [place] 01/07/2019, 12:27 PM

## 2019-01-07 NOTE — Progress Notes (Addendum)
STROKE TEAM PROGRESS NOTE   SUBJECTIVE (INTERVAL HISTORY) His wife is at the bedside. Sheath remains in d/t elevated INR. Likely d/c today. He is considering Sleep Smart research study. No neuro changes.   OBJECTIVE Vitals:   01/07/19 0730 01/07/19 0800 01/07/19 0830 01/07/19 0900  BP: (!) 146/59 (!) 142/74 (!) 147/53 135/81  Pulse: (!) 57 64 78 69  Resp: 17 13 17 15   Temp:  98 F (36.7 C)    TempSrc:  Oral    SpO2: 97% 97% 97% 96%  Weight:      Height:        CBC:  Recent Labs  Lab 01/04/19 1619 01/05/19 0611 01/06/19 0537 01/07/19 0543  WBC 14.0* 14.2* 14.2* 12.2*  NEUTROABS 11.6* 13.2*  --   --   HGB 11.9* 10.2* 9.6* 9.3*  HCT 36.5* 32.1* 29.7* 28.7*  MCV 94.3 93.3 94.6 95.3  PLT 369 359 316 086    Basic Metabolic Panel:  Recent Labs  Lab 01/06/19 0537 01/07/19 0543  NA 140 139  K 4.3 3.7  CL 113* 114*  CO2 20* 19*  GLUCOSE 119* 116*  BUN 26* 19  CREATININE 1.21 1.07  CALCIUM 8.2* 8.1*    Lipid Panel:     Component Value Date/Time   CHOL 90 01/05/2019 0611   TRIG 80 01/05/2019 0611   TRIG 80 01/05/2019 0611   HDL 30 (L) 01/05/2019 0611   CHOLHDL 3.0 01/05/2019 0611   VLDL 16 01/05/2019 0611   LDLCALC 44 01/05/2019 0611   HgbA1c:  Lab Results  Component Value Date   HGBA1C 6.3 (H) 01/05/2019   Urine Drug Screen:     Component Value Date/Time   LABOPIA NONE DETECTED 01/04/2019 2322   COCAINSCRNUR NONE DETECTED 01/04/2019 2322   LABBENZ NONE DETECTED 01/04/2019 2322   AMPHETMU NONE DETECTED 01/04/2019 2322   THCU NONE DETECTED 01/04/2019 2322   LABBARB NONE DETECTED 01/04/2019 2322    Alcohol Level     Component Value Date/Time   ETH <10 01/04/2019 1619    IMAGING  Ct Head Code Stroke Wo Contrast 01/04/2019   IMPRESSION:  1. Small age indeterminate small lacunar infarct in the ventral left thalamus.  2. No acute cortically based infarct identified. No acute intracranial hemorrhage, or mass effect.  3. ASPECTS is 10.   Ct Angio  Head W Or Wo Contrast Ct Angio Neck W Or Wo Contrast Ct Cerebral Perfusion W Contrast 01/04/2019 IMPRESSION:  1. Positive for Left MCA M1 ELVO with favorable CT Perfusion parameters: - estimated core infarct 0-15 mL and Left MCA penumbra of at least 95 mL. HPI 0.2.  2. Other vascular findings include soft plaque at the left ICA origin. Atherosclerosis but no hemodynamically significant stenosis elsewhere in the head or neck.   Cerebral Angiogram with Intervention - Dr [name]  01/04/2019 S/P : Lt common carotid arteriogram fiollowed by complete revascularization of Lt MCA M 1 occlusion with x 1 pass with 40m x 33 mm embotrap retriver device achieving a TICI revascularization   Mr Brain Wo Contrast 01/05/2019 IMPRESSION:  1. Multifocal acute ischemia within the left MCA distribution, predominantly located along the insula and basal ganglia. The area of infarction is much smaller than the area of at risk brain demonstrated on the earlier perfusion scan.  2. No mass effect or acute hemorrhage.  3. Chronic ischemic microangiopathy and old small vessel infarcts.   Dg Chest Port 1 View 01/04/2019 IMPRESSION:  Endotracheal tube tip just above the  carina. Retraction of 5 cm is recommended.   Dg Abd Portable 1v 01/04/2019 IMPRESSION:  1. No feeding tube seen.  2. Cardiomegaly.   Transthoracic Echocardiogram  The left ventricle has a visually estimated ejection fraction of 55%. The cavity size was normal. Left ventricular diastolic function could not be evaluated secondary to atrial fibrillation.  EKG - atrial fibrillation - ventricular response 65 BPM. (See cardiology reading for complete details)  Imaging past 24h No results found.    PHYSICAL EXAM : Pleasant elderly Caucasian male who is intubated and not in distress. . Afebrile. Head is nontraumatic. Neck is supple without bruit.    Cardiac exam no murmur or gallop. Lungs are clear to auscultation. Distal pulses are well felt except in  left lower extremity where.he has left below-knee amputation. He has arterial groin sheath in the left Neurological Exam :  He is  awake alert and interactive. He follows commands well. Extraocular movements are full range without nystagmus. He blinks to threat bilaterally. Fundi not visualized. Pupils equal reactive. Face is symmetric without weakness. Tongue midline. Motor system exam is able to move all 4 extremities equally well against gravity. No focal weakness. Deep tendon reflexes symmetric. Left plantar cannot be tested right is downgoing. Sensation appears intact bilaterally.   ASSESSMENT/PLAN [name] is a 83 y.o. male with history of Lt AKA, CAD with MI, atrial fibrillation (on coumadin), DM, ASPVD, and HTN, presenting with right sided facial droop and slurred speech. He did not receive IV t-PA due to being on anticoagulation. IR - Dr [name] complete revascularization of Lt MCA M 1 occlusion.  Stroke: left MCA infarct s/p L M1 revacularization - embolic - atrial fibrillation with subtherapeutic INR  CT head - Small age indeterminate small lacunar infarct in the ventral left thalamus.  MRI head - Multifocal acute ischemia within the left MCA distribution, predominantly located along the insula and basal ganglia.   CTA H&N -  soft plaque at the left ICA origin.  2D Echo - normal ejection fraction 55%. No clot  LDL - 44  HgbA1c - 6.3  UDS - negative  VTE prophylaxis - IV heparin  warfarin daily prior to admission, now on heparin IV - will turn off for sheath removal  Therapy recommendations:  pending  Disposition:  Pending  Atrial Fibrillation  Home anticoagulation:  warfarin daily - Warfarin 1 mg tabs -Take 2 tablets daily except 1 tablet on Mondays, Wednesdays and Fridays  INR 1.8 on admission  INR today 1.7  IV heparin per pharmacy started 3/16 . Plan oral anticoagulants once sheath out and stable  Warfarin related coagulopathy with continued  Sheath post IR  INR on admission 1.8  INR today remains 1.7  Plans for removal today  Hypertension  Stable . SBP goal < 180 . Long-term BP goal normotensive  Hyperlipidemia  Lipid lowering medication PTA: Mevacor 10 mg daily  LDL 44, goal < 70  Current lipid lowering medication: None  Lipitor and Simvastatin intolerance  Diabetes  HgbA1c 6.3, goal < 7.0  Controlled  Other Stroke Risk Factors  Advanced age  Coronary artery disease s/p CABG  Atrial fibrillation  PVD  Other Active Problems  Leukocytosis - 14.2->12.2 (afebrile)  Chronic anemia - Hb - 11.9->10.2->9.6->9.3  UTI - culture - 70K e coli - penicillin allergy - sulfa allergy - Rx with IV Cipro D#3/5  Acute respiratory failure, resolved - intubated for IR - extubated 3/15  Hospital day # [place],  MSN, APRN, ANVP-BC, AGPCNP-BC Advanced Practice Stroke Nurse [facility] for Schedule & Pager information 01/07/2019 9:35 AM   I have personally obtained history,examined this patient, reviewed notes, independently viewed imaging studies, participated in medical decision making and plan of care.ROS completed by me personally and pertinent positives fully documented  I have made any additions or clarifications directly to the above note. Agree with note above.  Patient continues to do well from a neurological standpoint but unfortunately it has left groin arterial sheath due to INR still being 1.7. I have discussed with Dr. Estanislado [name] personally and hopefully it will remote the sheath later this morning. Patient still considering possible participation in the sleep smart research study. Will start eliquis tonight. This patient is critically ill and at significant risk of neurological worsening, death and care requires constant monitoring of vital signs, hemodynamics,respiratory and cardiac monitoring, extensive review of multiple databases, frequent neurological assessment,  discussion with family, other specialists and medical decision making of high complexity.I have made any additions or clarifications directly to the above note.This critical care time does not reflect procedure time, or teaching time or supervisory time of PA/NP/Med Resident etc but could involve care discussion time.  I spent 30 minutes of neurocritical care time  in the care of  this patient.     Antony Contras, MD Medical Director [facility] Stroke Center Pager: [phone] 01/07/2019 2:08 PM   To contact Stroke Continuity provider, please refer to [email or web address] After hours, contact General Neurology

## 2019-01-07 NOTE — Progress Notes (Signed)
8 fr LFA sheath removed at 1015 using manual pressure.  Pressure held for 30 min with hemostasis documented at 1045am. Groin site level 0 and reviewed with Lauren RN at bedside.  Dr. Estanislado Pandy notes to restart heparin at 1445.

## 2019-01-08 LAB — CBC
HCT: 28.2 % — ABNORMAL LOW (ref 39.0–52.0)
Hemoglobin: 9.2 g/dL — ABNORMAL LOW (ref 13.0–17.0)
MCH: 30.6 pg (ref 26.0–34.0)
MCHC: 32.6 g/dL (ref 30.0–36.0)
MCV: 93.7 fL (ref 80.0–100.0)
Platelets: 268 10*3/uL (ref 150–400)
RBC: 3.01 MIL/uL — AB (ref 4.22–5.81)
RDW: 14.7 % (ref 11.5–15.5)
WBC: 10.6 10*3/uL — ABNORMAL HIGH (ref 4.0–10.5)
nRBC: 0 % (ref 0.0–0.2)

## 2019-01-08 LAB — GLUCOSE, CAPILLARY
Glucose-Capillary: 103 mg/dL — ABNORMAL HIGH (ref 70–99)
Glucose-Capillary: 93 mg/dL (ref 70–99)

## 2019-01-08 MED ORDER — PANTOPRAZOLE SODIUM 40 MG PO TBEC: 40.0000 mg | DELAYED_RELEASE_TABLET | Freq: Every day | ORAL | Status: DC

## 2019-01-08 MED ORDER — BOOST / RESOURCE BREEZE PO LIQD: 1.0000 | Freq: Two times a day (BID) | ORAL | Status: DC

## 2019-01-08 MED ORDER — SILVER SULFADIAZINE 1 % EX CREA: 1.0000 "application " | TOPICAL_CREAM | Freq: Every day | CUTANEOUS | Status: DC

## 2019-01-08 MED ORDER — APIXABAN 5 MG PO TABS: 5.0000 mg | ORAL_TABLET | Freq: Two times a day (BID) | ORAL | 0 refills | Status: DC

## 2019-01-08 MED FILL — ELIQUIS 5 MG TABLET: 5 | 30 days supply | Qty: 60 | Fill #0

## 2019-01-08 NOTE — Evaluation (Signed)
Physical Therapy Evaluation Patient Details Name: [name] MRN: [id number] DOB: 12/04/1935 Today's Date: 01/08/2019   History of Present Illness  83 year old man with history of atrial fibrillation on Coumadin, CAD status post CABG in 1990s, hypertension, DM 2, PVD, left AKA presenting with slurred speech, right-sided facial droop.  Found to have a left MCA occlusion s/p left common carotid arteriogram followed by revascularization of left MCA M1 occlusion by VIR.  Clinical Impression  Pt is at or close to baseline functioning and should be safe at home with available assist of his wife. There are no further acute PT needs.  Will sign off at this time.     Follow Up Recommendations No PT follow up    Equipment Recommendations  None recommended by PT    Recommendations for Other Services       Precautions / Restrictions Precautions Precautions: None;Other (comment)(AKA presents minimal fall risk)      Mobility  Bed Mobility Overal bed mobility: Modified Independent                Transfers Overall transfer level: Modified independent Equipment used: None             General transfer comment: Pt easily transferred via squat pivot w/c to bed to w/c with control.  Ambulation/Gait             General Gait Details: pt does not ambulate.  Hes prosthesis hurts and he doesn't use it.  Stairs            Wheelchair Mobility    Modified Rankin (Stroke Patients Only) Modified Rankin (Stroke Patients Only) Pre-Morbid Rankin Score: No symptoms Modified Rankin: No symptoms     Balance Overall balance assessment: No apparent balance deficits (not formally assessed)                                           Pertinent Vitals/Pain Pain Assessment: No/denies pain    Home Living Family/patient expects to be discharged to:: Private residence Living Arrangements: Spouse/significant other Available Help at Discharge: Family;Available  PRN/intermittently(wife is driven to work by pt.  she works 5 hrs/day) Type of Home: House Home Access: [place]: One [place]: [place] - power;Grab bars - toilet;Grab bars - tub/shower;Shower seat - built in      Prior Function Level of Independence: Independent with assistive device(s)               Hand Dominance        Extremity/Trunk Assessment   Upper Extremity Assessment Upper Extremity Assessment: Overall WFL for tasks assessed    Lower Extremity Assessment Lower Extremity Assessment: Overall WFL for tasks assessed       Communication   Communication: No difficulties  Cognition Arousal/Alertness: Awake/alert Behavior During Therapy: WFL for tasks assessed/performed Overall Cognitive Status: Within Functional Limits for tasks assessed                                        General Comments General comments (skin integrity, edema, etc.): pt's wife present throughout and feels as pt does that he is essentially back to his normal    Exercises     Assessment/Plan    PT Assessment Patent does not  need any further PT services  PT Problem List         PT Treatment Interventions      PT Goals (Current goals can be found in the Care Plan section)  Acute Rehab PT Goals Patient Stated Goal: go home. PT Goal Formulation: All assessment and education complete, DC therapy    Frequency     Barriers to discharge        Co-evaluation               AM-PAC PT "6 Clicks" Mobility  Outcome Measure Help needed turning from your back to your side while in a flat bed without using bedrails?: None Help needed moving from lying on your back to sitting on the side of a flat bed without using bedrails?: None Help needed moving to and from a bed to a chair (including a wheelchair)?: None Help needed standing up from a chair using your arms (e.g., wheelchair or bedside chair)?: A  Little Help needed to walk in hospital room?: Total Help needed climbing 3-5 steps with a railing? : Total 6 Click Score: 17    End of Session   Activity Tolerance: Patient tolerated treatment well Patient left: Other (comment);with family/visitor present(w/c)   PT Visit Diagnosis: Other symptoms and signs involving the nervous system (R29.898)    Time: 8921-1941 PT Time Calculation (min) (ACUTE ONLY): 20 min   Charges:   PT Evaluation $PT Eval Low Complexity: 1 Low          01/08/2019  Donnella Sham, PT Acute Rehabilitation Services [phone]  (pager) [phone]  (office)  Tessie Fass Dushawn Pusey 01/08/2019, 11:14 AM

## 2019-01-08 NOTE — Discharge Summary (Addendum)
Stroke Discharge Summary  Patient ID: [name]   MRN: [id number]      DOB: [birth year]  Date of Admission: 01/04/2019 Date of Discharge: 01/08/2019  Attending Physician:  Garvin Fila, MD, Stroke MD Consultant(s):    Olene Craven) Estanislado Pandy, MD (Interventional Neuroradiologist)  Patient's PCP:  Lemmie Evens, MD  DISCHARGE DIAGNOSIS:  Principal Problem:   Middle cerebral artery embolism, left ([place]) s/p mechanical thrombectomy from atrial fibrillation Active Problems:   Diabetes mellitus ([place])   Dyslipidemia   Essential hypertension   Permanent atrial fibrillation   Hx of CABG   Chronic anticoagulation   PVD (peripheral vascular disease) with claudication ([place])   Coronary artery disease involving coronary bypass graft of native heart without angina pectoris   Leukocytosis   Status post above-knee amputation of left lower extremity (HCC)   Acute blood loss anemia   Stroke [facility])   Stroke (cerebrum) (HCC)   Past Medical History:  Diagnosis Date  . Arteriosclerotic cardiovascular disease (ASCVD)    CABG in 1990s; 2002 total obstruction of LAD, CX and RCA with patent grafts and nl EF; Stress nuc. 2008 - mild LV dilation; normal EF; questionable small anteroapical scar; no ischemia  . Arthritis    "left knee" (11/08/2015)  . Atrial fibrillation ([place])   . Cellulitis 11/08/2015   "both feet"  . Chronic anticoagulation   . Dental crowns present   . Diabetic peripheral neuropathy (HCC)    bilateral lower leg, hands  . History of blood transfusion    "related to my leg surgery?"  . Hypertension    states under control with meds., has been on med. x "long time"  . Iron deficiency anemia   . Jaw cancer ([place]) 1990s   "squamous cell"  . Myocardial infarction ([place]) 1990s   "after my jaw OR"  . Pedal edema    "not a lot", per pt.  . Peripheral vascular disease ([place])   . Presence of retained hardware 07/2015   failed hardware jaw  . Runny nose 07/27/2015   clear  drainage, per pt.  . Type II diabetes mellitus ([place])    "diet controlled" (11/08/2015)   Past Surgical History:  Procedure Laterality Date  . AMPUTATION Left 11/11/2015   Procedure: LEFT ABOVE KNEE AMPUTATION;  Surgeon: Angelia Mould, MD;  Location: [place];  Service: Vascular;  Laterality: Left;  . CARDIAC CATHETERIZATION  1990s; 04/16/2001  . CATARACT EXTRACTION W/ INTRAOCULAR LENS  IMPLANT, BILATERAL Bilateral 09/2015  . COLONOSCOPY N/A 11/27/2014   Procedure: COLONOSCOPY;  Surgeon: Rogene Houston, MD;  [facility];  Service: Endoscopy;  Laterality: N/A;  830  . CORONARY ARTERY BYPASS GRAFT  1990's   "CABG X4"  . ESOPHAGOGASTRODUODENOSCOPY N/A 02/14/2018   Procedure: ESOPHAGOGASTRODUODENOSCOPY (EGD);  Surgeon: Rogene Houston, MD;  [facility];  Service: Endoscopy;  Laterality: N/A;  . ESOPHAGOGASTRODUODENOSCOPY (EGD) WITH PROPOFOL N/A 02/25/2018   Procedure: ESOPHAGOGASTRODUODENOSCOPY (EGD) WITH PROPOFOL;  Surgeon: Rogene Houston, MD;  [facility];  Service: Endoscopy;  Laterality: N/A;  . FRACTURE SURGERY    . GIVENS CAPSULE STUDY N/A 02/15/2018   Procedure: GIVENS CAPSULE STUDY;  Surgeon: Rogene Houston, MD;  [facility];  Service: Endoscopy;  Laterality: N/A;  . GIVENS CAPSULE STUDY N/A 03/27/2018   Procedure: GIVENS CAPSULE STUDY;  Surgeon: Rogene Houston, MD;  [facility];  Service: Endoscopy;  Laterality: N/A;  . I&D EXTREMITY Left 11/10/2015  Procedure: INCISION AND DRAINAGE LEFT FOOT, AMPUTATION OF LEFT THIRD TOE;  Surgeon: Conrad Amarillo, MD;  Location: [facility];  Service: Vascular;  Laterality: Left;  . INCISION AND DRAINAGE ABSCESS Left 06/16/2005   wide exc. abscess 5th toe  . IR ANGIOGRAM VISCERAL SELECTIVE  03/28/2018  . IR ANGIOGRAM VISCERAL SELECTIVE  03/28/2018  . IR CT HEAD LTD  01/04/2019  . IR US GUIDE VASC ACCESS LEFT  03/28/2018  . MANDIBULAR HARDWARE REMOVAL Left 08/02/2015   Procedure:  HARDWARE REMOVAL TWO  MANDIBULAR SCREWS;  Surgeon: Jannette Fogo, DDS;  Location: [facility] [place];  Service: Oral Surgery;  Laterality: Left;  . ORIF TIBIA FRACTURE Left ~ 1970  . PERIPHERAL VASCULAR CATHETERIZATION N/A 08/14/2016   Procedure: Abdominal Aortogram w/Lower Extremity;  Surgeon: Angelia Mould, MD;  Location: [facility];  Service: Cardiovascular;  Laterality: N/A;  . RADIOLOGY WITH ANESTHESIA N/A 01/04/2019   Procedure: CODE STROKE;  Surgeon: Radiologist, Medication, MD;  Location: [place];  Service: Radiology;  Laterality: N/A;  . SQUAMOUS CELL CARCINOMA EXCISION Left 1993   "took my jaw out; got cadavar in there now"  . TONSILLECTOMY      Allergies as of 01/08/2019      Reactions   Lipitor [atorvastatin] Other (See Comments)   SEVERE HEADACHE   Simvastatin Other (See Comments)   MUSCLE ACHES   Penicillins Rash   Has patient had a PCN reaction causing immediate rash, facial/tongue/throat swelling, SOB or lightheadedness with hypotension: Yes Has patient had a PCN reaction causing severe rash involving mucus membranes or skin necrosis: No Has patient had a PCN reaction that required hospitalization No Has patient had a PCN reaction occurring within the last 10 years: No If all of the above answers are "NO", then may proceed with Cephalosporin use.   Sulfa Antibiotics Rash   Tolerates silver sulfadiazine cream at home      Medication List    STOP taking these medications   warfarin 1 MG tablet Commonly known as:  COUMADIN     TAKE these medications   amLODipine 10 MG tablet Commonly known as:  NORVASC Take 1 tablet (10 mg total) by mouth daily.   apixaban 5 MG Tabs tablet Commonly known as:  ELIQUIS Take 1 tablet (5 mg total) by mouth 2 (two) times daily.   cetirizine 10 MG tablet Commonly known as:  ZYRTEC Take 10 mg by mouth daily.   feeding supplement Liqd Take 1 Container by mouth 2 (two) times daily.   ferrous sulfate 325 (65 FE) MG tablet Take 65 mg  by mouth daily with breakfast.   Fish Oil 1200 MG Caps Take 1,200 mg by mouth daily.   FLINTSTONES PLUS IRON PO Take 1 tablet by mouth daily.   losartan-hydrochlorothiazide 100-12.5 MG tablet Commonly known as:  HYZAAR Take 1 tablet by mouth daily.   lovastatin 10 MG tablet Commonly known as:  MEVACOR Take 10 mg by mouth daily.   nebivolol 10 MG tablet Commonly known as:  BYSTOLIC Take 10 mg by mouth daily.   nystatin cream Commonly known as:  MYCOSTATIN Apply 1 application topically 3 (three) times daily.   pantoprazole 40 MG tablet Commonly known as:  PROTONIX Take 1 tablet (40 mg total) by mouth daily.   PHILLIPS COLON HEALTH PO Take 1 capsule by mouth daily.   silver sulfADIAZINE 1 % cream Commonly known as:  SILVADENE Apply 1 application topically daily. What changed:  how much to take  LABORATORY STUDIES CBC    Component Value Date/Time   WBC 10.6 (H) 01/08/2019 0427   RBC 3.01 (L) 01/08/2019 0427   HGB 9.2 (L) 01/08/2019 0427   HCT 28.2 (L) 01/08/2019 0427   PLT 268 01/08/2019 0427   MCV 93.7 01/08/2019 0427   MCH 30.6 01/08/2019 0427   MCHC 32.6 01/08/2019 0427   RDW 14.7 01/08/2019 0427   LYMPHSABS 0.7 01/05/2019 0611   MONOABS 0.2 01/05/2019 0611   EOSABS 0.0 01/05/2019 0611   BASOSABS 0.0 01/05/2019 0611   CMP    Component Value Date/Time   NA 139 01/07/2019 0543   K 3.7 01/07/2019 0543   CL 114 (H) 01/07/2019 0543   CO2 19 (L) 01/07/2019 0543   GLUCOSE 116 (H) 01/07/2019 0543   BUN 19 01/07/2019 0543   CREATININE 1.07 01/07/2019 0543   CREATININE 1.17 03/07/2018 0840   CALCIUM 8.1 (L) 01/07/2019 0543   PROT 7.8 01/04/2019 1619   ALBUMIN 3.5 01/04/2019 1619   AST 20 01/04/2019 1619   ALT 18 01/04/2019 1619   ALKPHOS 110 01/04/2019 1619   BILITOT 0.5 01/04/2019 1619   GFRNONAA >60 01/07/2019 0543   GFRAA >60 01/07/2019 0543   COAGS Lab Results  Component Value Date   INR 1.7 (H) 01/07/2019   INR 1.7 (H) 01/06/2019   INR  1.7 (H) 01/05/2019   Lipid Panel    Component Value Date/Time   CHOL 90 01/05/2019 0611   TRIG 116 01/07/2019 2022   HDL 30 (L) 01/05/2019 0611   CHOLHDL 3.0 01/05/2019 0611   VLDL 16 01/05/2019 0611   LDLCALC 44 01/05/2019 0611   HgbA1C  Lab Results  Component Value Date   HGBA1C 6.3 (H) 01/05/2019   Urinalysis    Component Value Date/Time   COLORURINE YELLOW 01/04/2019 2322   APPEARANCEUR HAZY (A) 01/04/2019 2322   LABSPEC 1.043 (H) 01/04/2019 2322   PHURINE 5.0 01/04/2019 2322   GLUCOSEU NEGATIVE 01/04/2019 2322   HGBUR NEGATIVE 01/04/2019 2322   BILIRUBINUR NEGATIVE 01/04/2019 2322   KETONESUR NEGATIVE 01/04/2019 2322   PROTEINUR NEGATIVE 01/04/2019 2322   NITRITE POSITIVE (A) 01/04/2019 2322   LEUKOCYTESUR LARGE (A) 01/04/2019 2322   Urine Drug Screen     Component Value Date/Time   LABOPIA NONE DETECTED 01/04/2019 2322   COCAINSCRNUR NONE DETECTED 01/04/2019 2322   LABBENZ NONE DETECTED 01/04/2019 2322   AMPHETMU NONE DETECTED 01/04/2019 2322   THCU NONE DETECTED 01/04/2019 2322   LABBARB NONE DETECTED 01/04/2019 2322    Alcohol Level    Component Value Date/Time   ETH <10 01/04/2019 1619     SIGNIFICANT DIAGNOSTIC STUDIES Ct Head Code Stroke Wo Contrast 01/04/2019   IMPRESSION:  1. Small age indeterminate small lacunar infarct in the ventral left thalamus.  2. No acute cortically based infarct identified. No acute intracranial hemorrhage, or mass effect.  3. ASPECTS is 10.   Ct Angio Head W Or Wo Contrast Ct Angio Neck W Or Wo Contrast Ct Cerebral Perfusion W Contrast 01/04/2019 IMPRESSION:  1. Positive for Left MCA M1 ELVO with favorable CT Perfusion parameters: - estimated core infarct 0-15 mL and Left MCA penumbra of at least 95 mL. HPI 0.2.  2. Other vascular findings include soft plaque at the left ICA origin. Atherosclerosis but no hemodynamically significant stenosis elsewhere in the head or neck.   Cerebral Angiogram with Intervention -  Dr Estanislado Pandy  01/04/2019 S/P : Lt common carotid arteriogram fiollowed by complete revascularization of Lt MCA  M 1 occlusion with x 1 pass with 3m x 33 mm embotrap retriver device achieving a TICI revascularization  Mr Brain Wo Contrast 01/05/2019 IMPRESSION:  1. Multifocal acute ischemia within the left MCA distribution, predominantly located along the insula and basal ganglia. The area of infarction is much smaller than the area of at risk brain demonstrated on the earlier perfusion scan.  2. No mass effect or acute hemorrhage.  3. Chronic ischemic microangiopathy and old small vessel infarcts.   Dg Chest Port 1 View 01/04/2019 IMPRESSION:  Endotracheal tube tip just above the carina. Retraction of 5 cm is recommended.   Dg Abd Portable 1v 01/04/2019 IMPRESSION:  1. No feeding tube seen.  2. Cardiomegaly.   Transthoracic Echocardiogram  The left ventricle has a visually estimated ejection fraction of 55%. The cavity size was normal. Left ventricular diastolic function could not be evaluated secondary to atrial fibrillation.  EKG - atrial fibrillation - ventricular response 65 BPM. (See cardiology reading for complete details)     HISTORY OF PRESENT ILLNESS [name] KELTNERis an 83y.o. male who [facility] the [facility] ED in the late afternoon on 01/04/2019 with slurred speech first noticed at noon. LKW 01/04/2019 at 1200. When he arrived at the ED, right sided facial droop and slurred speech were noted. After Teleneurology evaluation, he was out of the time window for tPA. CTA of head and neck with CTP were obtained, revealing a left MCA occlusion and significant perfusion deficit in the left MCA territory of 95 cc. NIHSS was 10. His mRS was rated as 0 in the context of a left AKA. He is on Coumadin for atrial fibrillation. [facility]   HOSPITAL COURSE Mr. PARTEMUS ROMANOFFis a 83y.o. male with history of Lt  AKA, CAD with MI, atrial fibrillation (on coumadin), DM, ASPVD, and HTN, presenting with right sided facial droop and slurred speech. He did not receive IV t-PA due to being on anticoagulation. IR - Dr DEstanislado Pandycomplete revascularization of Lt MCA M 1 occlusion. Patient was kept in the intensive care unit and did very well. He was extubated immediately postprocedure. He had a groin arterial sheath which had to be left in place for 2 days till INR of 1.7 and was discontinued. His neurological exam was excellent. NIH stroke scale of 0. Patient was started on IV heparin initially and later on eliquis at discharge. He was considered to be at high risk for sleep apnea and was offered a chance to participate in the sleep smart trial but patient declined. He was advised to follow-up with primary physician and Dr. SLeonie ManIn  6 weeks.  Stroke: left MCA infarct s/p L M1 revacularization - embolic - atrial fibrillation with subtherapeutic INR  CT head - Small age indeterminate small lacunar infarct in the ventral left thalamus.  MRI head - Multifocal acute ischemia within the left MCA distribution, predominantly located along the insula and basal ganglia.   CTA H&N -  soft plaque at the left ICA origin.  2D Echo - normal ejection fraction 55%. No clot  LDL - 44  HgbA1c - 6.3  UDS - negative  warfarin daily prior to admission, treated with heparin IV in the hospital. Changed to eliquis prior to d/c. No indication for additional aspirin   Therapy recommendations:  no therapy needs  Disposition:  return home w/ wife  Atrial Fibrillation  Home anticoagulation:  warfarin  daily - Warfarin 1 mg tabs -Take 2 tablets daily except 1 tablet on Mondays, Wednesdays and Fridays  INR 1.8 on admission  INR 3/17 1.7  Now off IV heparin  On oral Eliquis. Continue at d/c  Warfarin related coagulopathy  INR on admission 1.8  INR 3/17 1.7  Delayed sheath removal to 3/17 due to ongoing elevated  INR  Hypertension  Stable  SBP goal < 180  Long-term BP goal normotensive  Hyperlipidemia  Lipid lowering medication PTA: Mevacor 10 mg daily  LDL 44, goal < 70  Current lipid lowering medication: None  Lipitor and Simvastatin intolerance  Diabetes  Diet controlled   HgbA1c 6.3, goal < 7.0  Other Stroke Risk Factors  Advanced age  Coronary artery disease s/p CABG  PVD  Other Active Problems  Leukocytosis - 14.2->10.6 (afebrile)  Acute blood loss anemia post IR in the setting of chronic anemia - Hb - 11.9->10.2->9.6->9.2  UTI - culture - 70K e coli - penicillin allergy - sulfa allergy - Rx with IV Cipro D# 4/5  Acute respiratory failure, resolved - intubated for IR - extubated 3/15    DISCHARGE EXAM : Blood pressure (!) 119/58, pulse 84, temperature 98.7 F (37.1 C), temperature source Oral, resp. rate 13, height 5' 8"  (1.727 m), weight 79.8 kg, SpO2 95 %. Pleasant elderly Caucasian male who is intubated and not in distress. . Afebrile. Head is nontraumatic. Neck is supple without bruit.    Cardiac exam no murmur or gallop. Lungs are clear to auscultation. Distal pulses are well felt except in left lower extremity where.he has left below-knee amputation.  Neurological Exam :  He is  awake alert and interactive. He follows commands well. Extraocular movements are full range without nystagmus. He blinks to threat bilaterally. Fundi not visualized. Pupils equal reactive. Face is symmetric without weakness. Tongue midline. Motor system exam is able to move all 4 extremities equally well against gravity. No focal weakness. Deep tendon reflexes symmetric. Left plantar cannot be tested right is downgoing. Sensation appears intact bilaterally. NIHSS 0 Discharge Diet   Carb modified thin liquids  DISCHARGE PLAN  Disposition:  Return home  Eliquis (apixaban) daily for secondary stroke prevention.  Ongoing risk factor control by Primary Care Physician at time of  discharge  Follow-up Lemmie Evens, MD in 2 weeks.  Follow-up in [facility] in 4 weeks, office to schedule an appointment.   35 minutes were spent preparing discharge.  Burnetta Sabin, MSN, APRN, ANVP-BC, AGPCNP-BC Advanced Practice Stroke Nurse [place] for Schedule & Pager information 01/08/2019 12:55 PM   I have personally obtained history,examined this patient, reviewed notes, independently viewed imaging studies, participated in medical decision making and plan of care.ROS completed by me personally and pertinent positives fully documented  I have made any additions or clarifications directly to the above note. Agree with note above.   Antony Contras, MD Medical Director [place] Pager: [phone] 01/08/2019 1:02 PM

## 2019-01-08 NOTE — Progress Notes (Signed)
OT Cancellation Note  Patient Details Name: [name] MRN: [id number] DOB: 21-May-1936   Cancelled Treatment:    Reason Eval/Treat Not Completed: OT screened, no needs identified, will sign off; spoke with PT, pt is at his baseline regarding mobility and ADL status (spouse assists with ADL at baseline, plans to continue to assist). No further acute OT needs identified at this time, will sign off. Please re-consult if pt needs change;Thank you for this referral.   Lou Cal, OT Supplemental Rehabilitation Services Pager [phone] Office [place] 01/08/2019, 1:16 PM

## 2019-01-10 ENCOUNTER — Other Ambulatory Visit: Payer: Self-pay | Admitting: *Deleted

## 2019-01-10 NOTE — Patient Outreach (Addendum)
[facility]) Care Management  01/10/2019  [name] Oct 28, 1935 [id number]   Subjective: Telephone call to patient's home number, spoke with patient, and HIPAA verified.  Discussed [facility] Care Management Medicare EMMI Stroke Red Alert flag follow up, patient voiced understanding, and is in agreement to follow up.  Patient states he is doing fine, able to do activities of daily living without difficult, and remembers receiving EMMI automated call.   Patient states he is taking all of his medications as prescribed and has not missed a dose.  Patient will call next Tuesday (01/14/2019) when primary MD's office opens to schedule follow up appointment, and is waiting for a call back from neurologist regarding follow up appointment in Stroke Clinic.  Patient will follow up with neurologist office if no call back received by Wednesday (01/15/2019) close of business.  Patient states he does not have any education material, EMMI follow up, care coordination, care management, disease monitoring, transportation, community resource, or pharmacy needs at this time.  States he is very appreciative of the follow up.    Objective: Per KPN (Knowledge Performance Now, point of care tool) and chart review, patient hospitalized 01/04/2019 -01/08/2019 for Middle cerebral artery embolism, left, status post mechanical thrombectomy from atrial fibrillation.  Patient also has a history of diabetes, Dyslipidemia, hypertension, Permanent atrial fibrillation, PVD, Coronary artery disease involving coronary bypass graft of native heart without angina pectoris, Stroke (cerebrum), Acute blood loss anemia, Arteriosclerotic cardiovascular disease, and jaw cancer.     Assessment: Received Medicare EMMI Stroke Red Flag Alert follow up on 01/10/2019.  EMM Stroke Red Alert Flag Triggers times 2, Day 1, patient answered no to the following questions: Scheduled a follow-up appointment?   Been able to take every dose of meds?   EMMI follow up completed and no further care management needs.       Plan: RNCM will complete case closure due to follow up completed / no care management needs.      Ibeth Fahmy H. Annia Friendly, BSN, [facility] Telephonic CM Phone: [phone] Fax: [phone]

## 2019-01-13 ENCOUNTER — Other Ambulatory Visit: Payer: Self-pay | Admitting: *Deleted

## 2019-01-13 NOTE — Patient Outreach (Signed)
[facility]) Care Management  01/13/2019  [name] [birth year] [id number]   Subjective: Telephone call to patient's home number, spoke with patient, and HIPAA verified.  Discussed [facility] Care Management Medicare EMMI Stroke Red Alert flag follow up, patient voiced understanding, and is in agreement to follow up.  Patient states he is doing great, is not feeling worse, remembers speaking with this RNCM on 01/10/2019, remembers receiving EMMI automated call, and thinks the automated call misunderstood his response.   Patient states he does not have any education material, EMMI follow up, care coordination, care management, disease monitoring, transportation, community resource, or pharmacy needs at this time.  States he is very appreciative of the follow up.    Objective: Per KPN (Knowledge Performance Now, point of care tool) and chart review, patient hospitalized 01/04/2019 -01/08/2019 for Middle cerebral artery embolism, left, status post mechanical thrombectomy from atrial fibrillation.  Patient also has a history of diabetes, Dyslipidemia, hypertension, Permanent atrial fibrillation, PVD, Coronary artery disease involving coronary bypass graft of native heart without angina pectoris, Stroke (cerebrum), Acute blood loss anemia, Arteriosclerotic cardiovascular disease, and jaw cancer.     Assessment: Received Medicare EMMI Stroke Red Flag Alert follow up on 01/13/2019.  EMM Stroke Red Alert Flag Triggers times 1, Day 3, patient answered yes to the following questions: Feeling worse overall? EMMI follow up completed and no further care management needs.       Plan: RNCM will complete case closure due to follow up completed / no care management needs.      [name], BSN, [place] Management [facility] Telephonic CM Phone: [phone] Fax: [phone]

## 2019-01-20 ENCOUNTER — Encounter (HOSPITAL_COMMUNITY): Payer: Self-pay

## 2019-01-22 ENCOUNTER — Telehealth: Payer: Self-pay | Admitting: Cardiovascular Disease

## 2019-01-22 MED ORDER — APIXABAN 5 MG PO TABS: 5.0000 mg | ORAL_TABLET | Freq: Two times a day (BID) | ORAL | 0 refills | Status: DC

## 2019-01-22 NOTE — Telephone Encounter (Signed)
Refill complete 

## 2019-01-22 NOTE — Telephone Encounter (Signed)
Needs refill on Eliquis sent [facility]. Was changed while patient in hospital for stroke. / tg

## 2019-02-26 ENCOUNTER — Inpatient Hospital Stay: Payer: Medicare Other | Admitting: Adult Health

## 2019-03-06 ENCOUNTER — Ambulatory Visit: Payer: Self-pay | Admitting: *Deleted

## 2019-04-05 ENCOUNTER — Other Ambulatory Visit: Payer: Self-pay | Admitting: Cardiovascular Disease

## 2019-04-10 ENCOUNTER — Other Ambulatory Visit: Payer: Self-pay

## 2019-04-10 NOTE — Patient Outreach (Signed)
Telephone outreach to patient to obtain mRs was successfully completed. mRs= 1.

## 2019-04-10 NOTE — Patient Outreach (Signed)
First attempt to obtain mRs. No answer. Left message for return call.

## 2019-04-21 ENCOUNTER — Telehealth: Payer: Self-pay

## 2019-04-21 NOTE — Telephone Encounter (Signed)
If pt calls back r/s him with Janett Billow NP in a hospital follow up .

## 2019-04-21 NOTE — Telephone Encounter (Signed)
Left message that provider will be out of the office that week. I stated appt will be cancel. Left message to call back and r/s with Janett Billow NP.

## 2019-04-27 ENCOUNTER — Other Ambulatory Visit: Payer: Self-pay | Admitting: Cardiovascular Disease

## 2019-04-28 ENCOUNTER — Inpatient Hospital Stay: Payer: Medicare Other | Admitting: Adult Health

## 2019-05-12 ENCOUNTER — Inpatient Hospital Stay: Payer: Medicare Other | Admitting: Adult Health

## 2019-05-22 ENCOUNTER — Inpatient Hospital Stay: Payer: Medicare Other | Admitting: Adult Health

## 2019-06-11 ENCOUNTER — Other Ambulatory Visit: Payer: Self-pay | Admitting: Optometry

## 2019-06-12 ENCOUNTER — Ambulatory Visit (INDEPENDENT_AMBULATORY_CARE_PROVIDER_SITE_OTHER): Payer: Medicare Other | Admitting: Otolaryngology

## 2019-06-12 LAB — SARS CORONAVIRUS 2 (TAT 6-24 HRS): SARS Coronavirus 2: NEGATIVE

## 2019-06-18 ENCOUNTER — Ambulatory Visit (INDEPENDENT_AMBULATORY_CARE_PROVIDER_SITE_OTHER): Payer: Medicare Other | Admitting: Neurology

## 2019-06-18 ENCOUNTER — Encounter: Payer: Self-pay | Admitting: Neurology

## 2019-06-18 ENCOUNTER — Other Ambulatory Visit: Payer: Self-pay

## 2019-06-18 VITALS — BP 144/67 | HR 57 | Temp 98.0°F

## 2019-06-18 DIAGNOSIS — I63512 Cerebral infarction due to unspecified occlusion or stenosis of left middle cerebral artery: Secondary | ICD-10-CM

## 2019-06-18 DIAGNOSIS — I639 Cerebral infarction, unspecified: Secondary | ICD-10-CM

## 2019-06-18 NOTE — Patient Instructions (Signed)
I had a long d/w patient and his wife about his recent stroke,atrial fibrillation, risk for recurrent stroke/TIAs, personally independently reviewed imaging studies and stroke evaluation results and answered questions.Continue Eliquis (apixaban) daily  for secondary stroke prevention and maintain strict control of hypertension with blood pressure goal below 130/90, diabetes with hemoglobin A1c goal below 6.5% and lipids with LDL cholesterol goal below 70 mg/dL. I also advised the patient to eat a healthy diet with plenty of whole grains, cereals, fruits and vegetables, exercise regularly and maintain ideal body weight .he was recommended to use his walker at all times for ambulation and we discussed fall and safety precautions followup in the future with my nurse practitioner Janett Billow in 6 months or call earlier if necessary.  Fall Prevention in the Home, Adult Falls can cause injuries. They can happen to people of all ages. There are many things you can do to make your home safe and to help prevent falls. Ask for help when making these changes, if needed. What actions can I take to prevent falls? General Instructions  Use good lighting in all rooms. Replace any light bulbs that burn out.  Turn on the lights when you go into a dark area. Use night-lights.  Keep items that you use often in easy-to-reach places. Lower the shelves around your home if necessary.  Set up your furniture so you have a clear path. Avoid moving your furniture around.  Do not have throw rugs and other things on the floor that can make you trip.  Avoid walking on wet floors.  If any of your floors are uneven, fix them.  Add color or contrast paint or tape to clearly mark and help you see: ? Any grab bars or handrails. ? First and last steps of stairways. ? Where the edge of each step is.  If you use a stepladder: ? Make sure that it is fully opened. Do not climb a closed stepladder. ? Make sure that both sides of the  stepladder are locked into place. ? Ask someone to hold the stepladder for you while you use it.  If there are any pets around you, be aware of where they are. What can I do in the bathroom?      Keep the floor dry. Clean up any water that spills onto the floor as soon as it happens.  Remove soap buildup in the tub or shower regularly.  Use non-skid mats or decals on the floor of the tub or shower.  Attach bath mats securely with double-sided, non-slip rug tape.  If you need to sit down in the shower, use a plastic, non-slip stool.  Install grab bars by the toilet and in the tub and shower. Do not use towel bars as grab bars. What can I do in the bedroom?  Make sure that you have a light by your bed that is easy to reach.  Do not use any sheets or blankets that are too big for your bed. They should not hang down onto the floor.  Have a firm chair that has side arms. You can use this for support while you get dressed. What can I do in the kitchen?  Clean up any spills right away.  If you need to reach something above you, use a strong step stool that has a grab bar.  Keep electrical cords out of the way.  Do not use floor polish or wax that makes floors slippery. If you must use wax, use non-skid  floor wax. What can I do with my stairs?  Do not leave any items on the stairs.  Make sure that you have a light switch at the top of the stairs and the bottom of the stairs. If you do not have them, ask someone to add them for you.  Make sure that there are handrails on both sides of the stairs, and use them. Fix handrails that are broken or loose. Make sure that handrails are as long as the stairways.  Install non-slip stair treads on all stairs in your home.  Avoid having throw rugs at the top or bottom of the stairs. If you do have throw rugs, attach them to the floor with carpet tape.  Choose a carpet that does not hide the edge of the steps on the stairway.  Check any  carpeting to make sure that it is firmly attached to the stairs. Fix any carpet that is loose or worn. What can I do on the outside of my home?  Use bright outdoor lighting.  Regularly fix the edges of walkways and driveways and fix any cracks.  Remove anything that might make you trip as you walk through a door, such as a raised step or threshold.  Trim any bushes or trees on the path to your home.  Regularly check to see if handrails are loose or broken. Make sure that both sides of any steps have handrails.  Install guardrails along the edges of any raised decks and porches.  Clear walking paths of anything that might make someone trip, such as tools or rocks.  Have any leaves, snow, or ice cleared regularly.  Use sand or salt on walking paths during winter.  Clean up any spills in your garage right away. This includes grease or oil spills. What other actions can I take?  Wear shoes that: ? Have a low heel. Do not wear high heels. ? Have rubber bottoms. ? Are comfortable and fit you well. ? Are closed at the toe. Do not wear open-toe sandals.  Use tools that help you move around (mobility aids) if they are needed. These include: ? Canes. ? Walkers. ? Scooters. ? Crutches.  Review your medicines with your doctor. Some medicines can make you feel dizzy. This can increase your chance of falling. Ask your doctor what other things you can do to help prevent falls. Where to find more information  Centers for Disease Control and Prevention, STEADI: https://garcia.biz/  [employer] on Aging: BrainJudge.co.uk Contact a doctor if:  You are afraid of falling at home.  You feel weak, drowsy, or dizzy at home.  You fall at home. Summary  There are many simple things that you can do to make your home safe and to help prevent falls.  Ways to make your home safe include removing tripping hazards and installing grab bars in the bathroom.  Ask for help when  making these changes in your home. This information is not intended to replace advice given to you by your health care provider. Make sure you discuss any questions you have with your health care provider. Document Released: 08/05/2009 Document Revised: 01/30/2019 Document Reviewed: 05/24/2017 Elsevier Patient Education  2020 [employer].

## 2019-06-18 NOTE — Progress Notes (Signed)
[facility] [address] [place]. [name] [address] [phone]       OFFICE FOLLOW-UP NOTE  [name] Date of Birth:  01-31-1936 Medical Record Number:  [id number]   HPI: [name] is a 83 year old Caucasian male seen today for initial office follow-up visit following hospital admission for stroke in March 2020.  History is obtained from the patient and his wife was present throughout this visit as well as review of electronic medical records.  I personally reviewed imaging films in PACS.RANCE SMITHSON an 83 y.o.malewho presented to the [facility] ED in the late afternoon on 01/04/2019 with slurred speech first noticed at noon. LKW 01/04/2019 at 1200. When he arrived at the ED, right sided facial droop and slurred speech were noted. After Teleneurology evaluation, he was out of the time window for tPA. CTA of head and neck with CTP were obtained, revealing a left MCA occlusion and significant perfusion deficit in the left MCA territory of 95 cc. NIHSS was 10. His mRS was rated as 0 in the context of a left AKA. He is on Coumadin for atrial fibrillation. His INR was 1.8.he was transferred to [facility] for mechanical thrombectomy.  His NIH on admission was 10 and modified Rankin at baseline was 2 due to amputation.  Patient presented outside time window for TPA.  He underwent successful mechanical thrombectomy with complete revascularization of the left M1 occlusion.  He was kept in the intensive care unit and did very well.  Blood pressure was tightly controlled.  He was extubated but had to have a groin arterial sheath for 2 days still his INR came down to 1.7 before she it could be discontinued.  Neurological exam did show full recovery with NIH stroke scale of 0.  He was started initially on IV heparin and subsequently changed to Eliquis at discharge.  Patient states he has done well since discharge.  Is tolerating Eliquis well without bruising or bleeding.  His  blood pressures well controlled today it is 144/64.  Is tolerating Mevacor well without muscle aches or pains.  He is able to ambulate with his artificial leg using a walker.  He is careful and has had no falls.  He does use a wheelchair for long distances.  He is fairly independent and is able to drive his wife to work in his truck every day.  He has had no recurrent stroke or TIA symptoms  ROS:   14 system review of systems is positive for difficulty walking due to amputation and all other systems negative PMH:  Past Medical History:  Diagnosis Date   Arteriosclerotic cardiovascular disease (ASCVD)    CABG in 1990s; 2002 total obstruction of LAD, CX and RCA with patent grafts and nl EF; Stress nuc. 2008 - mild LV dilation; normal EF; questionable small anteroapical scar; no ischemia   Arthritis    "left knee" (11/08/2015)   Atrial fibrillation ([place])    Cellulitis 11/08/2015   "both feet"   Chronic anticoagulation    Dental crowns present    Diabetic peripheral neuropathy ([place])    bilateral lower leg, hands   History of blood transfusion    "related to my leg surgery?"   Hypertension    states under control with meds., has been on med. x "long time"   Iron deficiency anemia    Jaw cancer ([place]) 1990s   "squamous cell"   Myocardial infarction ([place]) 1990s   "after my jaw OR"  Pedal edema    "not a lot", per pt.   Peripheral vascular disease ([place])    Presence of retained hardware 07/2015   failed hardware jaw   Runny nose 07/27/2015   clear drainage, per pt.   Stroke ([place])    Type II diabetes mellitus ([place])    "diet controlled" (11/08/2015)    Social History:  Social History   Socioeconomic History   Marital status: Married    Spouse name: Not on file   Number of children: Not on file   Years of education: Not on file   Highest education level: Not on file  Occupational History   Not on file  Social Needs   Financial resource strain: Not on file     Food insecurity    Worry: Not on file    Inability: Not on file   Transportation needs    Medical: Not on file    Non-medical: Not on file  Tobacco Use   Smoking status: Never Smoker   Smokeless tobacco: Never Used  Substance and Sexual Activity   Alcohol use: No    Alcohol/week: 0.0 standard drinks   Drug use: No   Sexual activity: Yes  Lifestyle   Physical activity    Days per week: Not on file    Minutes per session: Not on file   Stress: Not on file  Relationships   Social connections    Talks on phone: Not on file    Gets together: Not on file    Attends religious service: Not on file    Active member of club or organization: Not on file    Attends meetings of clubs or organizations: Not on file    Relationship status: Not on file   Intimate partner violence    Fear of current or ex partner: Not on file    Emotionally abused: Not on file    Physically abused: Not on file    Forced sexual activity: Not on file  Other Topics Concern   Not on file  Social History Narrative   Not on file    Medications:   Current Outpatient Medications on File Prior to Visit  Medication Sig Dispense Refill   amLODipine (NORVASC) 10 MG tablet TAKE 1 TABLET DAILY 90 tablet 3   cetirizine (ZYRTEC) 10 MG tablet Take 10 mg by mouth daily.     ELIQUIS 5 MG TABS tablet TAKE 1 TABLET TWICE A DAY 180 tablet 0   feeding supplement (BOOST / RESOURCE BREEZE) LIQD Take 1 Container by mouth 2 (two) times daily.     ferrous sulfate 325 (65 FE) MG tablet Take 65 mg by mouth daily with breakfast.     losartan-hydrochlorothiazide (HYZAAR) 100-12.5 MG tablet Take 1 tablet by mouth daily.     lovastatin (MEVACOR) 10 MG tablet Take 10 mg by mouth daily.      nebivolol (BYSTOLIC) 10 MG tablet Take 10 mg by mouth daily.     nystatin cream (MYCOSTATIN) Apply 1 application topically 3 (three) times daily.     Omega-3 Fatty Acids (FISH OIL) 1200 MG CAPS Take 1,200 mg by mouth daily.      pantoprazole (PROTONIX) 40 MG tablet Take 1 tablet (40 mg total) by mouth daily. 30 tablet 0   Pediatric Multivitamins-Iron (FLINTSTONES PLUS IRON PO) Take 1 tablet by mouth daily.     Probiotic Product (PHILLIPS COLON HEALTH PO) Take 1 capsule by mouth daily.      silver sulfADIAZINE (SILVADENE)  1 % cream Apply 1 application topically daily.     No current facility-administered medications on file prior to visit.     Allergies:   Allergies  Allergen Reactions   Lipitor [Atorvastatin] Other (See Comments)    SEVERE HEADACHE   Simvastatin Other (See Comments)    MUSCLE ACHES   Penicillins Rash    Has patient had a PCN reaction causing immediate rash, facial/tongue/throat swelling, SOB or lightheadedness with hypotension: Yes Has patient had a PCN reaction causing severe rash involving mucus membranes or skin necrosis: No Has patient had a PCN reaction that required hospitalization No Has patient had a PCN reaction occurring within the last 10 years: No If all of the above answers are "NO", then may proceed with Cephalosporin use.    Sulfa Antibiotics Rash    Tolerates silver sulfadiazine cream at home    Physical Exam General: well developed, well nourished elderly Caucasian male, seated, in no evident distress Head: head normocephalic and atraumatic.  Neck: supple with no carotid or supraclavicular bruits Cardiovascular: regular rate and rhythm, no murmurs Musculoskeletal: no deformity except left above-knee amputation Skin:  no rash/petichiae Vascular:  Normal pulses all extremities Vitals:   06/18/19 1427  BP: (!) 144/67  Pulse: (!) 57  Temp: 98 F (36.7 C)   Neurologic Exam Mental Status: Awake and fully alert. Oriented to place and time. Recent and remote memory intact. Attention span, concentration and fund of knowledge appropriate. Mood and affect appropriate.  Cranial Nerves: Fundoscopic exam reveals sharp disc margins. Pupils equal, briskly reactive to  light. Extraocular movements full without nystagmus. Visual fields full to confrontation. Hearing intact. Facial sensation intact. Face, tongue, palate moves normally and symmetrically.  Motor: Normal bulk and tone. Normal strength in all tested extremity muscles.  Left lower extremity testing limited due to above-knee amputation.. Sensory.: intact to touch ,pinprick .position and vibratory sensation.  Coordination: Rapid alternating movements normal in all extremities. Finger-to-nose and heel-to-shin performed accurately bilaterally. Gait and Station: Arises from chair without difficulty. Stance is normal. Gait demonstrates normal stride length and balance . Able to heel, toe and tandem walk without difficulty.  Reflexes: 1+ and symmetric. Toes downgoing.   NIHSS 0 Modified Rankin 2 due to baseline left foot amputation  ASSESSMENT: 83 year old Caucasian male with embolic left MCA infarct from left MCA occlusion from atrial fibrillation treated with successful mechanical thrombectomy with excellent clinical outcome and no residual deficits.  Vascular risk factors of atrial fibrillation, diabetes, hypertension and hyperlipidemia and peripheral vascular disease     PLAN: I had a long d/w patient and his wife about his recent stroke,atrial fibrillation, risk for recurrent stroke/TIAs, personally independently reviewed imaging studies and stroke evaluation results and answered questions.Continue Eliquis (apixaban) daily  for secondary stroke prevention and maintain strict control of hypertension with blood pressure goal below 130/90, diabetes with hemoglobin A1c goal below 6.5% and lipids with LDL cholesterol goal below 70 mg/dL. I also advised the patient to eat a healthy diet with plenty of whole grains, cereals, fruits and vegetables, exercise regularly and maintain ideal body weight .he was recommended to use his walker at all times for ambulation and we discussed fall and safety precautions Greater  than 50% of time during this 25 minute visit was spent on counseling,explanation of diagnosis embolic stroke and atrial fibrillation, planning of further management, discussion with patient and family and coordination of care.followup in the future with my nurse practitioner Janett Billow in 6 months or call earlier if necessary. Loralye Loberg  Leonie Man, [facility] [address] [place], [place] [address]  Phone [phone] Fax [phone] Note: This document was prepared with digital dictation and possible smart phrase technology. Any transcriptional errors that result from this process are unintentional

## 2019-07-27 ENCOUNTER — Other Ambulatory Visit: Payer: Self-pay | Admitting: Cardiovascular Disease

## 2019-10-11 ENCOUNTER — Other Ambulatory Visit: Payer: Self-pay | Admitting: Cardiovascular Disease

## 2019-10-29 IMAGING — XA IR US GUIDE VASC ACCESS LEFT
1 series · 13 of 24 positions shown · IV contrast (IODINE)
Comparison: none

INDICATION: 81-year-old with GI bleeding. The suspected bleeding source is from
the proximal jejunum. Patient continues to have decreasing
hemoglobin level.

[Series 300: dsa body · 13 of 95 slices shown]
[im 1/95]
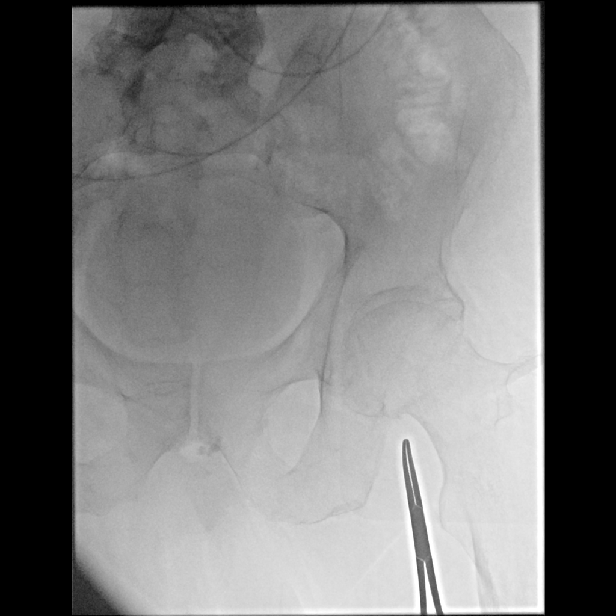
[im 9/95]
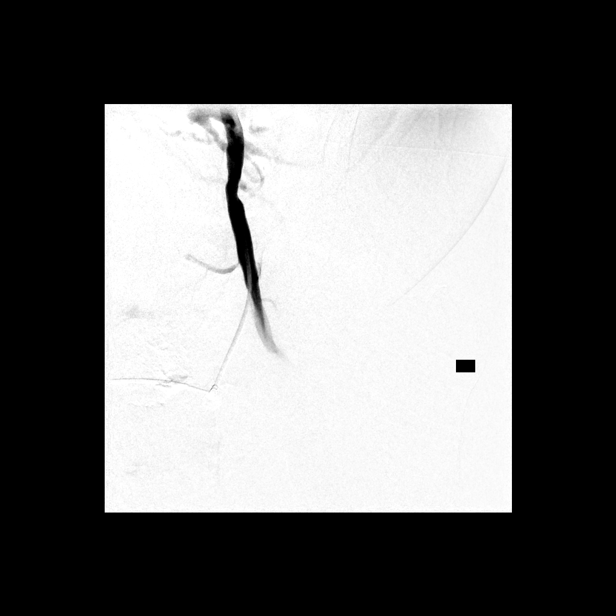
[im 17/95]
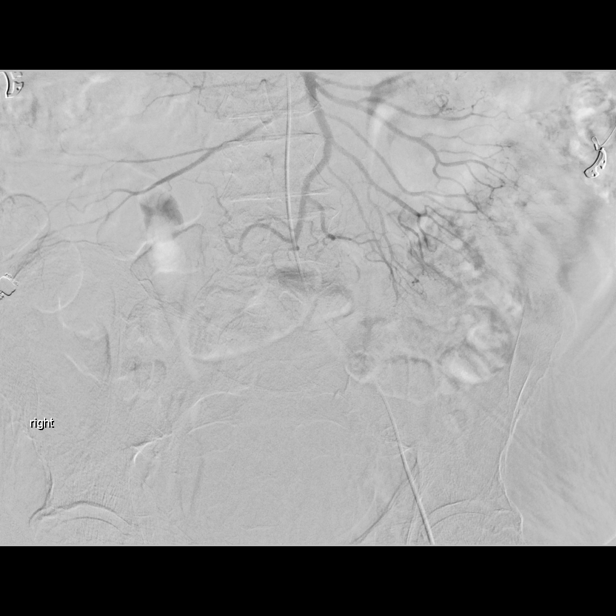
[im 25/95]
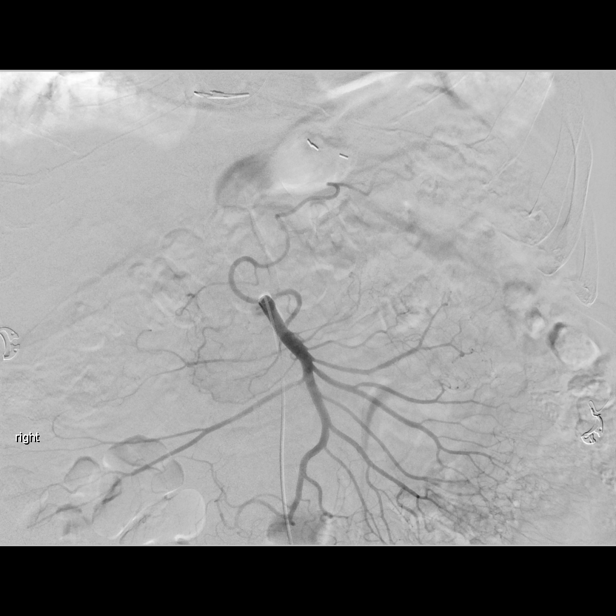
[im 33/95]
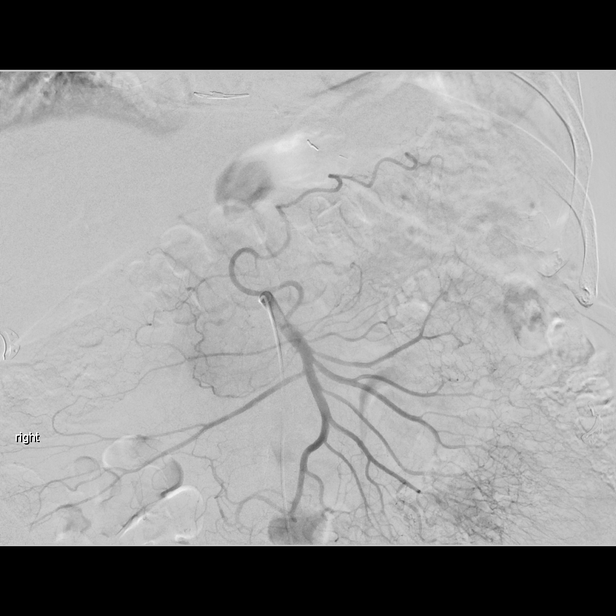
[im 41/95]
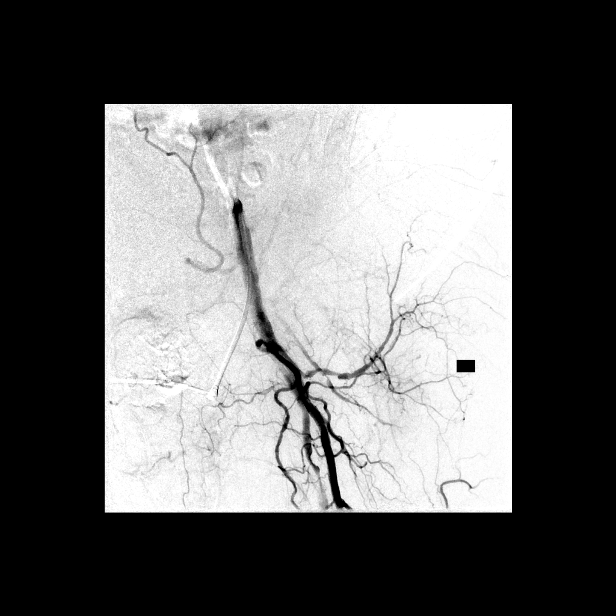
[im 50/95]
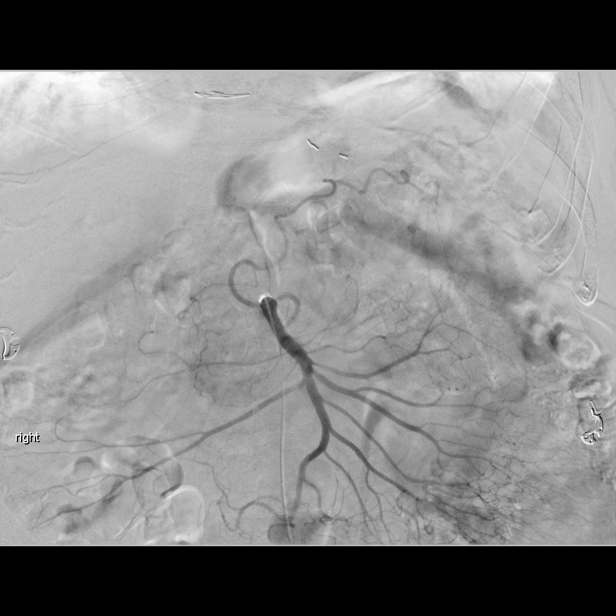
[im 54/95]
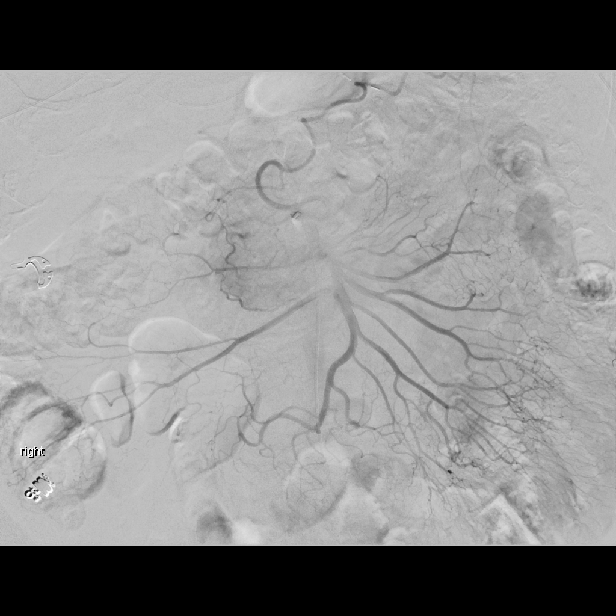
[im 62/95]
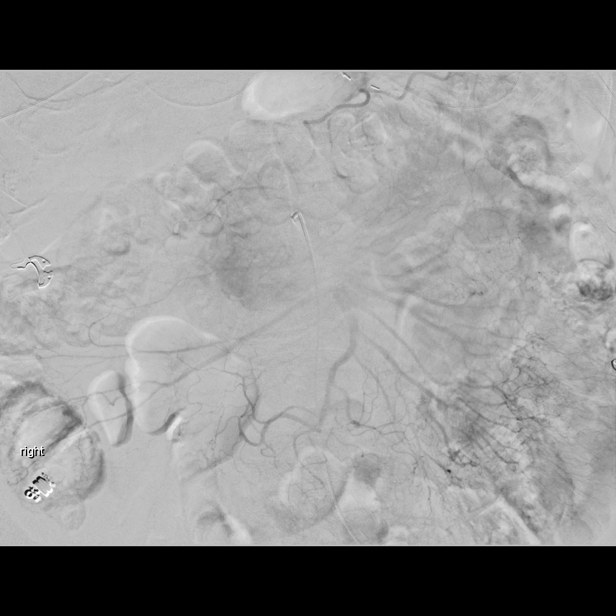
[im 70/95]
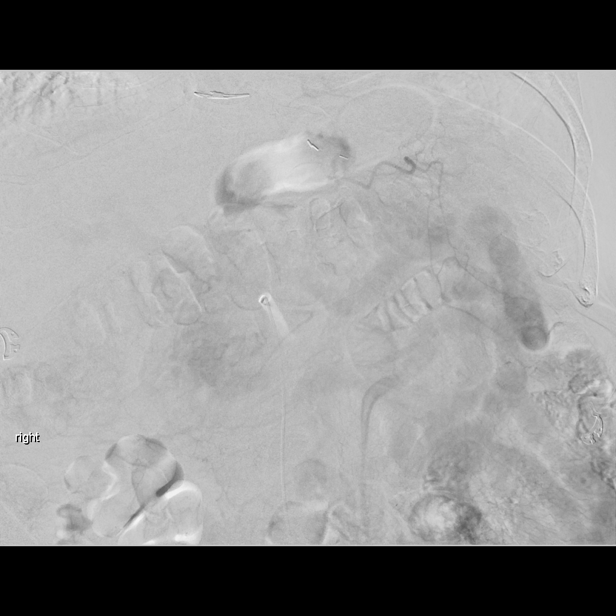
[im 78/95]
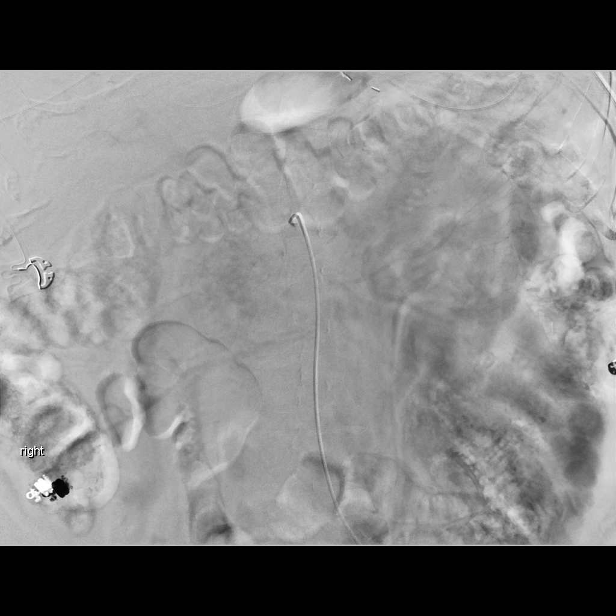
[im 86/95]
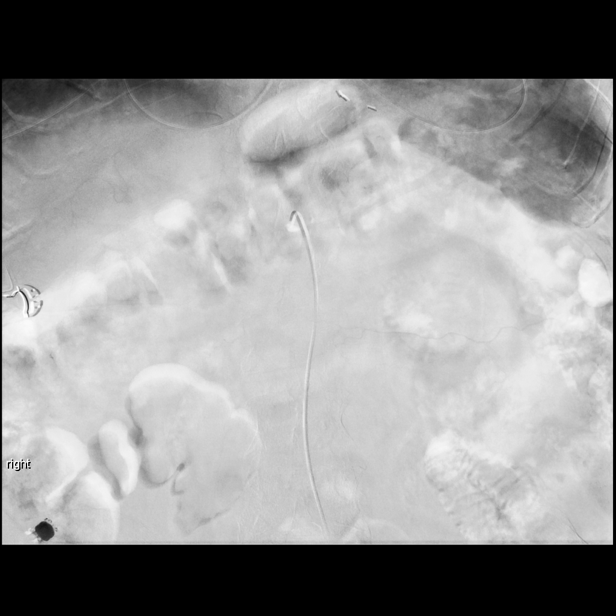
[im 95/95]
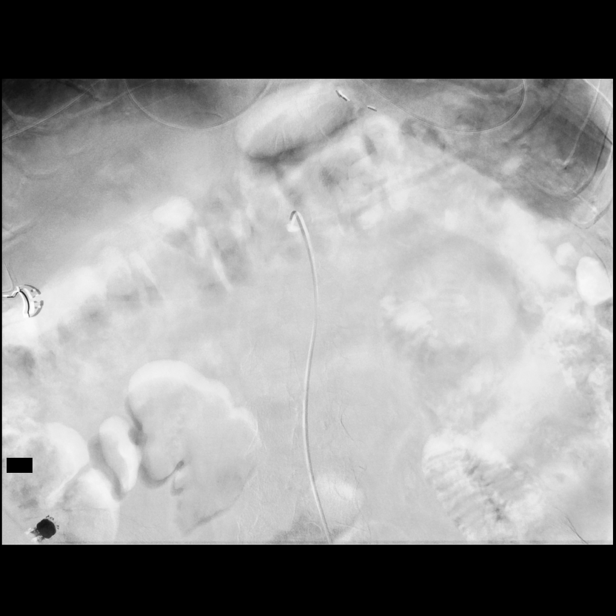

[13 of 24 positions shown; findings below may reference images not displayed]

EXAM:
MESENTERIC ARTERIOGRAM WITH SELECTION OF THE CELIAC TRUNK AND SMA

ULTRASOUND GUIDANCE FOR VASCULAR ACCESS

MEDICATIONS:
None

ANESTHESIA/SEDATION:
Moderate (conscious) sedation was employed during this procedure. A
total of Versed 2.0 mg and Fentanyl 100 mcg was administered
intravenously.

Moderate Sedation Time: 43 minutes. The patient's level of
consciousness and vital signs were monitored continuously by
radiology nursing throughout the procedure under my direct
supervision.

CONTRAST:  75 mL Msovue-NZZ

FLUOROSCOPY TIME:  Fluoroscopy Time: 8 minutes, 199 mGy

COMPLICATIONS:
None immediate.



Ultrasound confirmed a patent left common femoral artery. Ultrasound
image was taken and saved for documentation.

Left groin was selected for access based on previous left leg
amputation. Left groin was prepped and draped in sterile fashion.
Skin was anesthetized with 1% lidocaine. 21 gauge needle directed
into the left common femoral artery with ultrasound guidance and a
micropuncture dilator set was placed. A 5 French vascular sheath was
placed over a Bentson wire. A C2 catheter was used to select the
celiac trunk and the SMA. Celiac arteriography and SMA arteriography
was performed. The C2 catheter was exchanged for a Sos catheter and
attempted to cannulate the IMA but this was found to be technically
difficult. IMA was thought to be an unlikely source of the bleeding
based on the previous imaging and endoscopy, therefore, additional
attempts to cannulate the IMA were not made.

Final angiogram was performed through the left groin sheath. The
left groin sheath was removed using an ExoSeal closure device.
Hemostasis at the left groin at the end of the procedure.

Fluoroscopic images were taken and saved for this procedure.
FINDINGS: Celiac trunk is patent with typical anatomy. The left gastric
artery, splenic artery and common hepatic artery are patent. No
gross abnormality involving the GDA. No active bleeding or vascular
malformations associated with the celiac arterial distribution.
Atherosclerotic disease involving the splenic artery. Splenic vein
and portal venous system are patent.

SMA and main branches are patent.. No evidence for active bleeding
or vascular malformation involving the SMA. SMV and portal venous
system are patent.
IMPRESSION: Negative mesenteric arteriogram. No evidence for active bleeding or
vascular malformation involving the celiac or SMA arteries.

## 2019-11-12 ENCOUNTER — Telehealth: Payer: Self-pay | Admitting: Cardiovascular Disease

## 2019-11-12 NOTE — Telephone Encounter (Signed)

## 2019-11-14 ENCOUNTER — Telehealth (INDEPENDENT_AMBULATORY_CARE_PROVIDER_SITE_OTHER): Payer: Medicare Other | Admitting: Cardiovascular Disease

## 2019-11-14 ENCOUNTER — Encounter: Payer: Self-pay | Admitting: Cardiovascular Disease

## 2019-11-14 VITALS — BP 134/58 | HR 56 | Ht 68.5 in | Wt 174.0 lb

## 2019-11-14 DIAGNOSIS — I739 Peripheral vascular disease, unspecified: Secondary | ICD-10-CM

## 2019-11-14 DIAGNOSIS — E785 Hyperlipidemia, unspecified: Secondary | ICD-10-CM

## 2019-11-14 DIAGNOSIS — I10 Essential (primary) hypertension: Secondary | ICD-10-CM

## 2019-11-14 DIAGNOSIS — I4821 Permanent atrial fibrillation: Secondary | ICD-10-CM

## 2019-11-14 DIAGNOSIS — I25708 Atherosclerosis of coronary artery bypass graft(s), unspecified, with other forms of angina pectoris: Secondary | ICD-10-CM

## 2019-11-14 NOTE — Patient Instructions (Signed)
Medication Instructions:  Your physician recommends that you continue on your current medications as directed. Please refer to the Current Medication list given to you today.  *If you need a refill on your cardiac medications before your next appointment, please call your pharmacy*  Lab Work: NONE If you have labs (blood work) drawn today and your tests are completely normal, you will receive your results only by: Marland Kitchen MyChart Message (if you have MyChart) OR . A paper copy in the mail If you have any lab test that is abnormal or we need to change your treatment, we will call you to review the results.  Testing/Procedures: NONE  Follow-Up: At [facility], you and your health needs are our priority.  As part of our continuing mission to provide you with exceptional heart care, we have created designated Provider Care Teams.  These Care Teams include your primary Cardiologist (physician) and Advanced Practice Providers (APPs -  Physician Assistants and Nurse Practitioners) who all work together to provide you with the care you need, when you need it.  Your next appointment:  12 months  The format for your next appointment:  OFFICE Visit, In Person   Provider:  Dr.Koneswaran   Other Instructions NONE      Thank you for choosing [facility] !

## 2019-11-14 NOTE — Progress Notes (Signed)
Virtual Visit via Telephone Note   This visit type was conducted due to national recommendations for restrictions regarding the COVID-19 Pandemic (e.g. social distancing) in an effort to limit this patient's exposure and mitigate transmission in our community.  Due to his co-morbid illnesses, this patient is at least at moderate risk for complications without adequate follow up.  This format is felt to be most appropriate for this patient at this time.  The patient did not have access to video technology/had technical difficulties with video requiring transitioning to audio format only (telephone).  All issues noted in this document were discussed and addressed.  No physical exam could be performed with this format.  Please refer to the patient's chart for his  consent to telehealth for [facility].   Date:  11/14/2019   ID:  [name], DOB 1936-04-30, MRN [id number]  Patient Location: Home Provider Location: Office  PCP:  Lemmie Evens, MD  Cardiologist:  Kate Sable, MD  Electrophysiologist:  None   Evaluation Performed:  Follow-Up Visit  Chief Complaint:  coronary artery disease and atrial fibrillation  History of Present Illness:    [name] is a 84 y.o. male with a history of coronary artery disease and atrial fibrillation. Has a history of CVA in March 2020.  He has a previous history of GI bleeding and anemia.  He's had some weeping from the right leg. He says it is getting better. He is going to see vascular surgery in the near future.  He denies chest pain, palpitations, and shortness of breath.    Social history: His son, Yohann Curl, is the Psychologist, sport and exercise.  He plays piano and gospel music and Mark's son plays bass and guitar.  Past Medical History:  Diagnosis Date  . Arteriosclerotic cardiovascular disease (ASCVD)    CABG in 1990s; 2002 total obstruction of LAD, CX and RCA with patent grafts and nl EF; Stress nuc. 2008 - mild LV dilation;  normal EF; questionable small anteroapical scar; no ischemia  . Arthritis    "left knee" (11/08/2015)  . Atrial fibrillation ([place])   . Cellulitis 11/08/2015   "both feet"  . Chronic anticoagulation   . Dental crowns present   . Diabetic peripheral neuropathy (HCC)    bilateral lower leg, hands  . History of blood transfusion    "related to my leg surgery?"  . Hypertension    states under control with meds., has been on med. x "long time"  . Iron deficiency anemia   . Jaw cancer ([place]) 1990s   "squamous cell"  . Myocardial infarction ([place]) 1990s   "after my jaw OR"  . Pedal edema    "not a lot", per pt.  . Peripheral vascular disease ([place])   . Presence of retained hardware 07/2015   failed hardware jaw  . Runny nose 07/27/2015   clear drainage, per pt.  . Stroke ([place])   . Type II diabetes mellitus ([place])    "diet controlled" (11/08/2015)   Past Surgical History:  Procedure Laterality Date  . AMPUTATION Left 11/11/2015   Procedure: LEFT ABOVE KNEE AMPUTATION;  Surgeon: Angelia Mould, MD;  Location: [place];  Service: Vascular;  Laterality: Left;  . CARDIAC CATHETERIZATION  1990s; 04/16/2001  . CATARACT EXTRACTION W/ INTRAOCULAR LENS  IMPLANT, BILATERAL Bilateral 09/2015  . COLONOSCOPY N/A 11/27/2014   Procedure: COLONOSCOPY;  Surgeon: Rogene Houston, MD;  [facility];  Service: Endoscopy;  Laterality: N/A;  830  .  CORONARY ARTERY BYPASS GRAFT  1990's   "CABG X4"  . ESOPHAGOGASTRODUODENOSCOPY N/A 02/14/2018   Procedure: ESOPHAGOGASTRODUODENOSCOPY (EGD);  Surgeon: Rogene Houston, MD;  [facility];  Service: Endoscopy;  Laterality: N/A;  . ESOPHAGOGASTRODUODENOSCOPY (EGD) WITH PROPOFOL N/A 02/25/2018   Procedure: ESOPHAGOGASTRODUODENOSCOPY (EGD) WITH PROPOFOL;  Surgeon: Rogene Houston, MD;  [facility];  Service: Endoscopy;  Laterality: N/A;  . FRACTURE SURGERY    . GIVENS CAPSULE STUDY N/A 02/15/2018   Procedure: GIVENS CAPSULE STUDY;   Surgeon: Rogene Houston, MD;  [facility];  Service: Endoscopy;  Laterality: N/A;  . GIVENS CAPSULE STUDY N/A 03/27/2018   Procedure: GIVENS CAPSULE STUDY;  Surgeon: Rogene Houston, MD;  [facility];  Service: Endoscopy;  Laterality: N/A;  . I & D EXTREMITY Left 11/10/2015   Procedure: INCISION AND DRAINAGE LEFT FOOT, AMPUTATION OF LEFT THIRD TOE;  Surgeon: Conrad Lumberton, MD;  Location: [place];  Service: Vascular;  Laterality: Left;  . INCISION AND DRAINAGE ABSCESS Left 06/16/2005   wide exc. abscess 5th toe  . IR ANGIOGRAM VISCERAL SELECTIVE  03/28/2018  . IR ANGIOGRAM VISCERAL SELECTIVE  03/28/2018  . IR CT HEAD LTD  01/04/2019  . IR PERCUTANEOUS ART THROMBECTOMY/INFUSION INTRACRANIAL INC DIAG ANGIO  01/04/2019  . IR US GUIDE VASC ACCESS LEFT  03/28/2018  . MANDIBULAR HARDWARE REMOVAL Left 08/02/2015   Procedure:  HARDWARE REMOVAL TWO MANDIBULAR SCREWS;  Surgeon: Jannette Fogo, DDS;  Location: [place];  Service: Oral Surgery;  Laterality: Left;  . ORIF TIBIA FRACTURE Left ~ 1970  . PERIPHERAL VASCULAR CATHETERIZATION N/A 08/14/2016   Procedure: Abdominal Aortogram w/Lower Extremity;  Surgeon: Angelia Mould, MD;  Location: [facility];  Service: Cardiovascular;  Laterality: N/A;  . RADIOLOGY WITH ANESTHESIA N/A 01/04/2019   Procedure: CODE STROKE;  Surgeon: Radiologist, Medication, MD;  Location: [place];  Service: Radiology;  Laterality: N/A;  . SQUAMOUS CELL CARCINOMA EXCISION Left 1993   "took my jaw out; got cadavar in there now"  . TONSILLECTOMY       Current Meds  Medication Sig  . amLODipine (NORVASC) 10 MG tablet TAKE 1 TABLET DAILY  . cetirizine (ZYRTEC) 10 MG tablet Take 10 mg by mouth daily.  Marland Kitchen ELIQUIS 5 MG TABS tablet TAKE 1 TABLET TWICE A DAY  . feeding supplement (BOOST / RESOURCE BREEZE) LIQD Take 1 Container by mouth 2 (two) times daily.  . ferrous sulfate 325 (65 FE) MG tablet Take 65 mg by mouth daily with breakfast.  .  losartan-hydrochlorothiazide (HYZAAR) 100-12.5 MG tablet Take 1 tablet by mouth daily.  Marland Kitchen lovastatin (MEVACOR) 10 MG tablet Take 10 mg by mouth daily.   . nebivolol (BYSTOLIC) 10 MG tablet Take 10 mg by mouth daily.  Marland Kitchen nystatin cream (MYCOSTATIN) Apply 1 application topically 3 (three) times daily.  . Omega-3 Fatty Acids (FISH OIL) 1200 MG CAPS Take 1,200 mg by mouth daily.  . Pediatric Multivitamins-Iron (FLINTSTONES PLUS IRON PO) Take 1 tablet by mouth daily.  . Probiotic Product (PHILLIPS COLON HEALTH PO) Take 1 capsule by mouth daily.   . silver sulfADIAZINE (SILVADENE) 1 % cream Apply 1 application topically daily.     Allergies:   Lipitor [atorvastatin], Simvastatin, Penicillins, and Sulfa antibiotics   Social History   Tobacco Use  . Smoking status: Never Smoker  . Smokeless tobacco: Never Used  Substance Use Topics  . Alcohol use: No    Alcohol/week: 0.0  standard drinks  . Drug use: No     Family Hx: The patient's family history includes Heart disease in his mother.  ROS:   Please see the history of present illness.     All other systems reviewed and are negative.   Prior CV studies:   The following studies were reviewed today:  Echo 01/05/19:   1. The left ventricle has a visually estimated ejection fraction of 55%. The cavity size was normal. Left ventricular diastolic function could not be evaluated secondary to atrial fibrillation.  2. The mitral valve is abnormal. Mild thickening of the mitral valve leaflet.  3. The tricuspid valve is grossly normal.  4. The aortic valve is grossly normal. Mild thickening of the aortic valve. Mild calcification of the aortic valve. Moderate annular calcification. Aortic valve regurgitation is mild by color flow Doppler.  5. The aortic root is normal in size and structure.  6. Left atrial size was severely dilated.  7. Right atrial size was moderately dilated.  8. The inferior vena cava was dilated in size with <50% respiratory  variability.  9. Pulmonary hypertension is mild.  Labs/Other Tests and Data Reviewed:    EKG:  No ECG reviewed.  Recent Labs: 01/04/2019: ALT 18 01/07/2019: BUN 19; Creatinine, Ser 1.07; Potassium 3.7; Sodium 139 01/08/2019: Hemoglobin 9.2; Platelets 268   Recent Lipid Panel Lab Results  Component Value Date/Time   CHOL 90 01/05/2019 06:11 AM   TRIG 116 01/07/2019 08:22 PM   HDL 30 (L) 01/05/2019 06:11 AM   CHOLHDL 3.0 01/05/2019 06:11 AM   LDLCALC 44 01/05/2019 06:11 AM    Wt Readings from Last 3 Encounters:  11/14/19 174 lb (78.9 kg)  01/04/19 176 lb (79.8 kg)  07/31/18 173 lb (78.5 kg)     Objective:    Vital Signs:  BP (!) 134/58   Pulse (!) 56   Ht 5' 8.5" (1.74 m)   Wt 174 lb (78.9 kg)   BMI 26.07 kg/m    VITAL SIGNS:  reviewed  ASSESSMENT & PLAN:    1. Coronary artery disease: History of CABG. Symptomatically stable. Continue Bystolic and lovastatin. No aspirin as he is on apixaban.  2. Permanent atrial fibrillation: Rate controlled on Bystolic. Anticoagulated with apixaban.  3. Hypertension: Blood pressure is normal. No changes to therapy.  4. Hyperlipidemia: Currently on lovastatin 10 mg.  I will obtain a copy of lipids from PCP.  5. Peripheral vascular disease: No aspirin as he is on apixaban.  On lovastatin 10 mg. Followed by vascular surgery.     COVID-19 Education: The signs and symptoms of COVID-19 were discussed with the patient and how to seek care for testing (follow up with PCP or arrange E-visit).  The importance of social distancing was discussed today.  Time:   Today, I have spent 10 minutes with the patient with telehealth technology discussing the above problems.     Medication Adjustments/Labs and Tests Ordered: Current medicines are reviewed at length with the patient today.  Concerns regarding medicines are outlined above.   Tests Ordered: No orders of the defined types were placed in this encounter.   Medication  Changes: No orders of the defined types were placed in this encounter.   Follow Up:  In Person in 1 year(s)  Signed, Kate Sable, MD  11/14/2019 1:54 PM    [place]

## 2019-11-18 ENCOUNTER — Other Ambulatory Visit: Payer: Self-pay

## 2019-11-18 ENCOUNTER — Telehealth (HOSPITAL_COMMUNITY): Payer: Self-pay

## 2019-11-18 ENCOUNTER — Encounter (INDEPENDENT_AMBULATORY_CARE_PROVIDER_SITE_OTHER): Payer: Self-pay | Admitting: *Deleted

## 2019-11-18 DIAGNOSIS — I739 Peripheral vascular disease, unspecified: Secondary | ICD-10-CM

## 2019-11-18 NOTE — Telephone Encounter (Signed)

## 2019-11-19 ENCOUNTER — Ambulatory Visit (INDEPENDENT_AMBULATORY_CARE_PROVIDER_SITE_OTHER): Payer: Medicare Other | Admitting: Vascular Surgery

## 2019-11-19 ENCOUNTER — Other Ambulatory Visit: Payer: Self-pay

## 2019-11-19 ENCOUNTER — Encounter: Payer: Self-pay | Admitting: Vascular Surgery

## 2019-11-19 ENCOUNTER — Ambulatory Visit (HOSPITAL_COMMUNITY)
Admission: RE | Admit: 2019-11-19 | Discharge: 2019-11-19 | Disposition: A | Discharge status: Home / Self Care | Payer: Medicare Other | Source: Ambulatory Visit | Attending: Vascular Surgery | Admitting: Vascular Surgery

## 2019-11-19 VITALS — BP 145/63 | HR 58 | Temp 97.6°F | Resp 16 | Ht 68.5 in | Wt 173.0 lb

## 2019-11-19 DIAGNOSIS — I872 Venous insufficiency (chronic) (peripheral): Secondary | ICD-10-CM | POA: Diagnosis not present

## 2019-11-19 DIAGNOSIS — I25708 Atherosclerosis of coronary artery bypass graft(s), unspecified, with other forms of angina pectoris: Secondary | ICD-10-CM

## 2019-11-19 DIAGNOSIS — I739 Peripheral vascular disease, unspecified: Secondary | ICD-10-CM | POA: Diagnosis not present

## 2019-11-19 NOTE — Progress Notes (Signed)
Patient name: [name] MRN: [id number] DOB: 12-17-1935 Sex: male  REASON FOR VISIT:   Right Leg red and weeping.  Known peripheral vascular disease.  HPI:   [name] is a pleasant 84 y.o. male who I last saw on 07/31/2018.  He is previously had a above the knee amputation on the left.  Patient has a history of combined peripheral vascular disease and chronic venous insufficiency.  He previously had a wound on the right foot which had healed when I saw him last.  At the time of his last visit he had monophasic Doppler signals in the right foot with an ABI of 93% which may have been falsely elevated.  Toe pressure at that time was 67 mmHg.  I ordered a follow-up visit in 9 months.  Since I saw him last he has developed some weeping and redness in the right leg and has tried multiple ointments on this.  Most recently he has gone back to Vaseline gauze with mild compression and this seems to help the most.  At this point the redness has improved and the weeping has stopped.  He also elevates his legs daily which helps.  He does not have any rest pain with leg elevation.  He does not have a prosthesis so is nonambulatory.  He did have an arteriogram back in October 2017 which showed on the right side his only significant finding was the anterior tibial artery was occluded.  Since I saw him last he states that he did have a stroke in March 2020 associated with some right-sided weakness.  Reportedly his work-up was fairly unremarkable.  By CT angiogram he did not have any significant carotid disease.  Past Medical History:  Diagnosis Date  . Arteriosclerotic cardiovascular disease (ASCVD)    CABG in 1990s; 2002 total obstruction of LAD, CX and RCA with patent grafts and nl EF; Stress nuc. 2008 - mild LV dilation; normal EF; questionable small anteroapical scar; no ischemia  . Arthritis    "left knee" (11/08/2015)  . Atrial fibrillation ([place])   . Cellulitis 11/08/2015   "both feet"  .  Chronic anticoagulation   . Dental crowns present   . Diabetic peripheral neuropathy (HCC)    bilateral lower leg, hands  . History of blood transfusion    "related to my leg surgery?"  . Hypertension    states under control with meds., has been on med. x "long time"  . Iron deficiency anemia   . Jaw cancer ([place]) 1990s   "squamous cell"  . Myocardial infarction ([place]) 1990s   "after my jaw OR"  . Pedal edema    "not a lot", per pt.  . Peripheral vascular disease ([place])   . Presence of retained hardware 07/2015   failed hardware jaw  . Runny nose 07/27/2015   clear drainage, per pt.  . Stroke ([place])   . Type II diabetes mellitus ([place])    "diet controlled" (11/08/2015)    Family History  Problem Relation Age of Onset  . Heart disease Mother     SOCIAL HISTORY: Social History   Tobacco Use  . Smoking status: Never Smoker  . Smokeless tobacco: Never Used  Substance Use Topics  . Alcohol use: No    Alcohol/week: 0.0 standard drinks    Allergies  Allergen Reactions  . Lipitor [Atorvastatin] Other (See Comments)    SEVERE HEADACHE  . Simvastatin Other (See Comments)    MUSCLE ACHES  . Penicillins Rash  Has patient had a PCN reaction causing immediate rash, facial/tongue/throat swelling, SOB or lightheadedness with hypotension: Yes Has patient had a PCN reaction causing severe rash involving mucus membranes or skin necrosis: No Has patient had a PCN reaction that required hospitalization No Has patient had a PCN reaction occurring within the last 10 years: No If all of the above answers are "NO", then may proceed with Cephalosporin use.   . Sulfa Antibiotics Rash    Tolerates silver sulfadiazine cream at home    Current Outpatient Medications  Medication Sig Dispense Refill  . amLODipine (NORVASC) 10 MG tablet TAKE 1 TABLET DAILY 90 tablet 3  . cetirizine (ZYRTEC) 10 MG tablet Take 10 mg by mouth daily.    Marland Kitchen ELIQUIS 5 MG TABS tablet TAKE 1 TABLET TWICE A DAY 180  tablet 0  . feeding supplement (BOOST / RESOURCE BREEZE) LIQD Take 1 Container by mouth 2 (two) times daily.    . ferrous sulfate 325 (65 FE) MG tablet Take 65 mg by mouth daily with breakfast.    . losartan-hydrochlorothiazide (HYZAAR) 100-12.5 MG tablet Take 1 tablet by mouth daily.    Marland Kitchen lovastatin (MEVACOR) 10 MG tablet Take 10 mg by mouth daily.     . nebivolol (BYSTOLIC) 10 MG tablet Take 10 mg by mouth daily.    Marland Kitchen nystatin cream (MYCOSTATIN) Apply 1 application topically 3 (three) times daily.    . Omega-3 Fatty Acids (FISH OIL) 1200 MG CAPS Take 1,200 mg by mouth daily.    . Pediatric Multivitamins-Iron (FLINTSTONES PLUS IRON PO) Take 1 tablet by mouth daily.    . Probiotic Product (PHILLIPS COLON HEALTH PO) Take 1 capsule by mouth daily.     . silver sulfADIAZINE (SILVADENE) 1 % cream Apply 1 application topically daily.     No current facility-administered medications for this visit.    REVIEW OF SYSTEMS:  [X]  denotes positive finding, [ ]  denotes negative finding Cardiac  Comments:  Chest pain or chest pressure:    Shortness of breath upon exertion:    Short of breath when lying flat:    Irregular heart rhythm: x       Vascular    Pain in calf, thigh, or hip brought on by ambulation:    Pain in feet at night that wakes you up from your sleep:     Blood clot in your veins:    Leg swelling:         Pulmonary    Oxygen at home:    Productive cough:     Wheezing:         Neurologic    Sudden weakness in arms or legs:     Sudden numbness in arms or legs:     Sudden onset of difficulty speaking or slurred speech:    Temporary loss of vision in one eye:     Problems with dizziness:         Gastrointestinal    Blood in stool:     Vomited blood:         Genitourinary    Burning when urinating:     Blood in urine:        Psychiatric    Major depression:         Hematologic    Bleeding problems:    Problems with blood clotting too easily:        Skin    Rashes  or ulcers:        Constitutional  Fever or chills:     PHYSICAL EXAM:   Vitals:   11/19/19 0832  BP: (!) 145/63  Pulse: (!) 58  Resp: 16  Temp: 97.6 F (36.4 C)  TempSrc: Temporal  SpO2: 99%  Weight: 173 lb (78.5 kg)  Height: 5' 8.5" (1.74 m)    GENERAL: The patient is a well-nourished male, in no acute distress. The vital signs are documented above. CARDIAC: There is a regular rate and rhythm.  VASCULAR: I do not detect carotid bruits. I am unable to assess his femoral pulses as he is not in a wheelchair and I am unable to get him up on the table. I cannot palpate a right popliteal pulse or pedal pulses on the right foot The foot appears adequately perfused. PULMONARY: There is good air exchange bilaterally without wheezing or rales. ABDOMEN: Soft and non-tender with normal pitched bowel sounds.  MUSCULOSKELETAL: There are no major deformities or cyanosis. NEUROLOGIC: No focal weakness or paresthesias are detected. SKIN:  He has a rash over most of his leg as documented below.    PSYCHIATRIC: The patient has a normal affect.  DATA:    ARTERIAL DOPPLER STUDY: I have independently interpreted his arterial Doppler study today.  On the right side he has a monophasic dorsalis pedis and posterior tibial signal.  ABI is 91%.  This is likely falsely elevated.  Toe pressures 49 mmHg.  He has a left AKA.  LABS: The most recent labs I have were March 2020 when his GFR was greater than 60.  Creatinine was 1.07.  MEDICAL ISSUES:   COMBINED PERIPHERAL VASCULAR DISEASE AND CHRONIC VENOUS INSUFFICIENCY: Based on his noninvasive study his circulation appears adequate.  He is not a smoker.  I have encouraged him to continue with leg elevation as this has been helping him to continue with the dressing changes using Vaseline gauze with mild compression.  Once this is healed he will need to keep the skin well lubricated during the winter especially.  We also discussed the importance of  nutrition.  I ordered follow-up ABIs in 1 year and I will see him back at that time.  He knows to call sooner if he has problems.  Deitra Mayo [facility] [phone]

## 2019-11-20 ENCOUNTER — Other Ambulatory Visit: Payer: Self-pay | Admitting: *Deleted

## 2019-11-20 DIAGNOSIS — I739 Peripheral vascular disease, unspecified: Secondary | ICD-10-CM

## 2019-12-22 ENCOUNTER — Other Ambulatory Visit: Payer: Self-pay

## 2019-12-22 ENCOUNTER — Ambulatory Visit (INDEPENDENT_AMBULATORY_CARE_PROVIDER_SITE_OTHER): Payer: Medicare Other | Admitting: Adult Health

## 2019-12-22 ENCOUNTER — Encounter: Payer: Self-pay | Admitting: Adult Health

## 2019-12-22 VITALS — BP 134/54 | HR 50 | Temp 97.8°F | Ht 68.0 in | Wt 171.0 lb

## 2019-12-22 DIAGNOSIS — I63512 Cerebral infarction due to unspecified occlusion or stenosis of left middle cerebral artery: Secondary | ICD-10-CM | POA: Diagnosis not present

## 2019-12-22 DIAGNOSIS — I4821 Permanent atrial fibrillation: Secondary | ICD-10-CM

## 2019-12-22 DIAGNOSIS — E1149 Type 2 diabetes mellitus with other diabetic neurological complication: Secondary | ICD-10-CM

## 2019-12-22 DIAGNOSIS — E785 Hyperlipidemia, unspecified: Secondary | ICD-10-CM | POA: Diagnosis not present

## 2019-12-22 DIAGNOSIS — I10 Essential (primary) hypertension: Secondary | ICD-10-CM

## 2019-12-22 NOTE — Progress Notes (Signed)
[facility] [address] [place]. [name] [address] [phone]       OFFICE FOLLOW-UP NOTE  [name] Date of Birth:  1936/09/03 Medical Record Number:  [id number]   Chief complaint: Stroke follow-up   HPI:   [name] is a 84 year old male who is being seen today, 12/22/2019, for stroke follow-up accompanied by his wife.  He has been stable since prior visit from a stroke standpoint.  Continues on Eliquis and Mevacor for secondary stroke prevention without side effects.  Blood pressure today 134/54. He does have concerns regarding right leg redness and oozing and wife is concerned for possible infection.  Recently had follow-up with vascular surgery for PAD in 1/27.  No further concerns at this time.    History copied for reference purposes only Initial visit 06/18/2019 Dr. Leonie Man: [name] is a 84 year old Caucasian male seen today for initial office follow-up visit following hospital admission for stroke in March 2020.  History is obtained from the patient and his wife was present throughout this visit as well as review of electronic medical records.  I personally reviewed imaging films in PACS.[name] an 84 y.o.malewho presented to the [facility] ED in the late afternoon on 01/04/2019 with slurred speech first noticed at noon. LKW 01/04/2019 at 1200. When he arrived at the ED, right sided facial droop and slurred speech were noted. After Teleneurology evaluation, he was out of the time window for tPA. CTA of head and neck with CTP were obtained, revealing a left MCA occlusion and significant perfusion deficit in the left MCA territory of 95 cc. NIHSS was 10. His mRS was rated as 0 in the context of a left AKA. He is on Coumadin for atrial fibrillation. His INR was 1.8.he was transferred to [facility] for mechanical thrombectomy.  His NIH on admission was 10 and modified Rankin at baseline was 2 due to amputation.  Patient presented outside time window  for TPA.  He underwent successful mechanical thrombectomy with complete revascularization of the left M1 occlusion.  He was kept in the intensive care unit and did very well.  Blood pressure was tightly controlled.  He was extubated but had to have a groin arterial sheath for 2 days still his INR came down to 1.7 before she it could be discontinued.  Neurological exam did show full recovery with NIH stroke scale of 0.  He was started initially on IV heparin and subsequently changed to Eliquis at discharge.  Patient states he has done well since discharge.  Is tolerating Eliquis well without bruising or bleeding.  His blood pressures well controlled today it is 144/64.  Is tolerating Mevacor well without muscle aches or pains.  He is able to ambulate with his artificial leg using a walker.  He is careful and has had no falls.  He does use a wheelchair for long distances.  He is fairly independent and is able to drive his wife to work in his truck every day.  He has had no recurrent stroke or TIA symptoms    ROS:   14 system review of systems is positive for right leg pain and all other systems negative    PMH:  Past Medical History:  Diagnosis Date  . Arteriosclerotic cardiovascular disease (ASCVD)    CABG in 1990s; 2002 total obstruction of LAD, CX and RCA with patent grafts and nl EF; Stress nuc. 2008 - mild LV dilation; normal EF; questionable small anteroapical scar; no ischemia  .  Arthritis    "left knee" (11/08/2015)  . Atrial fibrillation ([place])   . Cellulitis 11/08/2015   "both feet"  . Chronic anticoagulation   . Dental crowns present   . Diabetic peripheral neuropathy (HCC)    bilateral lower leg, hands  . History of blood transfusion    "related to my leg surgery?"  . Hypertension    states under control with meds., has been on med. x "long time"  . Iron deficiency anemia   . Jaw cancer ([place]) 1990s   "squamous cell"  . Myocardial infarction ([place]) 1990s   "after my jaw OR"  .  Pedal edema    "not a lot", per pt.  . Peripheral vascular disease ([place])   . Presence of retained hardware 07/2015   failed hardware jaw  . Runny nose 07/27/2015   clear drainage, per pt.  . Stroke ([place])   . Type II diabetes mellitus ([place])    "diet controlled" (11/08/2015)    Social History:  Social History   Socioeconomic History  . Marital status: Married    Spouse name: Not on file  . Number of children: Not on file  . Years of education: Not on file  . Highest education level: Not on file  Occupational History  . Not on file  Tobacco Use  . Smoking status: Never Smoker  . Smokeless tobacco: Never Used  Substance and Sexual Activity  . Alcohol use: No    Alcohol/week: 0.0 standard drinks  . Drug use: No  . Sexual activity: Yes  Other Topics Concern  . Not on file  Social History Narrative  . Not on file   Social Determinants of Health   Financial Resource Strain:   . Difficulty of Paying Living Expenses: Not on file  Food Insecurity:   . Worried About Charity fundraiser in the Last Year: Not on file  . Ran Out of Food in the Last Year: Not on file  Transportation Needs:   . Lack of Transportation (Medical): Not on file  . Lack of Transportation (Non-Medical): Not on file  Physical Activity:   . Days of Exercise per Week: Not on file  . Minutes of Exercise per Session: Not on file  Stress:   . Feeling of Stress : Not on file  Social Connections:   . Frequency of Communication with Friends and Family: Not on file  . Frequency of Social Gatherings with Friends and Family: Not on file  . Attends Religious Services: Not on file  . Active Member of Clubs or Organizations: Not on file  . Attends Archivist Meetings: Not on file  . Marital Status: Not on file  Intimate Partner Violence:   . Fear of Current or Ex-Partner: Not on file  . Emotionally Abused: Not on file  . Physically Abused: Not on file  . Sexually Abused: Not on file    Medications:    Current Outpatient Medications on File Prior to Visit  Medication Sig Dispense Refill  . amLODipine (NORVASC) 10 MG tablet TAKE 1 TABLET DAILY 90 tablet 3  . cetirizine (ZYRTEC) 10 MG tablet Take 10 mg by mouth daily.    [place] Kitchen ELIQUIS 5 MG TABS tablet TAKE 1 TABLET TWICE A DAY 180 tablet 0  . feeding supplement (BOOST / RESOURCE BREEZE) LIQD Take 1 Container by mouth 2 (two) times daily.    . ferrous sulfate 325 (65 FE) MG tablet Take 65 mg by mouth daily with breakfast.    .  losartan-hydrochlorothiazide (HYZAAR) 100-12.5 MG tablet Take 1 tablet by mouth daily.    Marland Kitchen lovastatin (MEVACOR) 10 MG tablet Take 10 mg by mouth daily.     . nebivolol (BYSTOLIC) 10 MG tablet Take 10 mg by mouth daily.    Marland Kitchen nystatin cream (MYCOSTATIN) Apply 1 application topically 3 (three) times daily.    . Omega-3 Fatty Acids (FISH OIL) 1200 MG CAPS Take 1,200 mg by mouth daily.    . Pediatric Multivitamins-Iron (FLINTSTONES PLUS IRON PO) Take 1 tablet by mouth daily.    . Probiotic Product (PHILLIPS COLON HEALTH PO) Take 1 capsule by mouth daily.     . silver sulfADIAZINE (SILVADENE) 1 % cream Apply 1 application topically daily.     No current facility-administered medications on file prior to visit.    Allergies:   Allergies  Allergen Reactions  . Lipitor [Atorvastatin] Other (See Comments)    SEVERE HEADACHE  . Simvastatin Other (See Comments)    MUSCLE ACHES  . Penicillins Rash    Has patient had a PCN reaction causing immediate rash, facial/tongue/throat swelling, SOB or lightheadedness with hypotension: Yes Has patient had a PCN reaction causing severe rash involving mucus membranes or skin necrosis: No Has patient had a PCN reaction that required hospitalization No Has patient had a PCN reaction occurring within the last 10 years: No If all of the above answers are "NO", then may proceed with Cephalosporin use.   . Sulfa Antibiotics Rash    Tolerates silver sulfadiazine cream at home    Physical  Exam  Today's Vitals   12/22/19 1548  BP: (!) 134/54  Pulse: (!) 50  Temp: 97.8 F (36.6 C)  SpO2: 98%  Weight: 171 lb (77.6 kg)  Height: 5' 8"  (1.727 m)   Body mass index is 26 kg/m.  General: well developed, well nourished elderly Caucasian male, seated, in no evident distress Head: head normocephalic and atraumatic.  Neck: supple with no carotid or supraclavicular bruits Cardiovascular: irregular rate and rhythm, no murmurs Musculoskeletal: no deformity except left above-knee amputation Skin: Right leg wrapped in Ace bandage securely and intact.  Knee area bright red, warm with rash appearance Vascular:  Normal pulses all extremities  Neurologic Exam Mental Status: Awake and fully alert. Oriented to place and time. Recent and remote memory intact. Attention span, concentration and fund of knowledge appropriate. Mood and affect appropriate.  Cranial Nerves: Pupils equal, briskly reactive to light. Extraocular movements full without nystagmus. Visual fields full to confrontation. Hearing intact. Facial sensation intact. Face, tongue, palate moves normally and symmetrically.  Motor: Normal bulk and tone. Normal strength in all tested extremity muscles with left AKA Sensory.: intact to touch ,pinprick .position and vibratory sensation.  Coordination: Rapid alternating movements normal in all extremities. Finger-to-nose and heel-to-shin performed accurately bilaterally. Gait and Station: Deferred as currently in wheelchair without prosthetic left leg Reflexes: 1+ and symmetric. Toes downgoing.       ASSESSMENT: 84 year old Caucasian male with embolic left MCA infarct from left MCA occlusion from atrial fibrillation treated with successful mechanical thrombectomy with excellent clinical outcome and no residual deficits.  Vascular risk factors of atrial fibrillation, diabetes, hypertension and hyperlipidemia and peripheral vascular disease.  Greatest concern today in regards to left  leg redness and reported oozing lesions with history of PAD     PLAN: -Advised to contact vascular surgery provider in regards to left leg symptoms.  Wife questions use of antibiotic and deferred management and questions to vascular surgery provider -Continue  Eliquis (apixaban) daily and Mevacor for secondary stroke prevention -Continue to follow with cardiology for atrial fibrillation and Eliquis management -maintain strict control of hypertension with blood pressure goal below 130/90, diabetes with hemoglobin A1c goal below 6.5% and lipids with LDL cholesterol goal below 70 mg/dL. I also advised the patient to eat a healthy diet with plenty of whole grains, cereals, fruits and vegetables, exercise regularly and maintain ideal body weight -he was recommended to use his walker at all times for ambulation and we discussed fall and safety precautions   Follow-up in 6 months or call earlier if needed  Greater than 50% of time during this 30 minute visit was spent on counseling,explanation of diagnosis embolic stroke and atrial fibrillation, discussion regarding left leg concerns, planning of further management, discussion with patient and family and coordination of care.  Frann Rider, AGNP-BC  [facility] [address] [place], [place] [address]  Phone [phone] Fax [phone] Note: This document was prepared with digital dictation and possible smart phrase technology. Any transcriptional errors that result from this process are unintentional.

## 2019-12-22 NOTE — Patient Instructions (Signed)
Continue Eliquis (apixaban) daily  and Mevacor for secondary stroke prevention  Continue to follow up with PCP regarding cholesterol and blood pressure management   Continue to follow with cardiology for atrial fibrillation and Eliquis management  Contact vascular surgery regarding right leg concerns   Continue to monitor blood pressure at home  Maintain strict control of hypertension with blood pressure goal below 130/90, diabetes with hemoglobin A1c goal below 6.5% and cholesterol with LDL cholesterol (bad cholesterol) goal below 70 mg/dL. I also advised the patient to eat a healthy diet with plenty of whole grains, cereals, fruits and vegetables, exercise regularly and maintain ideal body weight.  Followup in the future with me in 6 months or call earlier if needed       Thank you for coming to see [place] at [facility]. I hope we have been able to provide you high quality care today.  You may receive a patient satisfaction survey over the next few weeks. We would appreciate your feedback and comments so that we may continue to improve ourselves and the health of our patients.

## 2019-12-23 NOTE — Progress Notes (Signed)
I agree with the above plan 

## 2020-01-26 ENCOUNTER — Other Ambulatory Visit: Payer: Self-pay | Admitting: Cardiovascular Disease

## 2020-04-05 ENCOUNTER — Other Ambulatory Visit: Payer: Self-pay | Admitting: Cardiovascular Disease

## 2020-04-21 ENCOUNTER — Other Ambulatory Visit: Payer: Self-pay | Admitting: Cardiovascular Disease

## 2020-06-23 ENCOUNTER — Ambulatory Visit (INDEPENDENT_AMBULATORY_CARE_PROVIDER_SITE_OTHER): Payer: Medicare Other | Admitting: Adult Health

## 2020-06-23 ENCOUNTER — Encounter: Payer: Self-pay | Admitting: Adult Health

## 2020-06-23 ENCOUNTER — Other Ambulatory Visit: Payer: Self-pay

## 2020-06-23 VITALS — BP 154/57 | HR 58 | Ht 68.0 in | Wt 177.0 lb

## 2020-06-23 DIAGNOSIS — I10 Essential (primary) hypertension: Secondary | ICD-10-CM | POA: Diagnosis not present

## 2020-06-23 DIAGNOSIS — E1149 Type 2 diabetes mellitus with other diabetic neurological complication: Secondary | ICD-10-CM

## 2020-06-23 DIAGNOSIS — I63512 Cerebral infarction due to unspecified occlusion or stenosis of left middle cerebral artery: Secondary | ICD-10-CM | POA: Diagnosis not present

## 2020-06-23 DIAGNOSIS — I4821 Permanent atrial fibrillation: Secondary | ICD-10-CM

## 2020-06-23 DIAGNOSIS — E785 Hyperlipidemia, unspecified: Secondary | ICD-10-CM

## 2020-06-23 NOTE — Patient Instructions (Signed)
Continue Eliquis (apixaban) daily  and Mevacor  for secondary stroke prevention  Continue to follow with cardiology for atrial fibrillation and Eliquis management   Continue to follow up with PCP regarding cholesterol and blood pressure management  Maintain strict control of hypertension with blood pressure goal below 130/90 and cholesterol with LDL cholesterol (bad cholesterol) goal below 70 mg/dL.     Overall stable from a stroke standpoint, recommend follow up on a as needed basis        Thank you for coming to see [place] at [facility]. I hope we have been able to provide you high quality care today.  You may receive a patient satisfaction survey over the next few weeks. We would appreciate your feedback and comments so that we may continue to improve ourselves and the health of our patients.

## 2020-06-23 NOTE — Progress Notes (Signed)
[facility] [address] [place]. [name] [address] [phone]       OFFICE FOLLOW-UP NOTE  [name] Date of Birth:  1936-07-19 Medical Record Number:  [id number]   Chief complaint: Stroke follow-up   HPI:   Today, 06/23/2020 [name] is being seen for stroke follow-up accompanied by his wife  He has been stable without residual stroke deficits and denies new or reoccurring stroke/TIA symptoms  Remains on Eliquis without bleeding or bruising for secondary stroke prevention and atrial fibrillation Remains on Mevacor for HLD management without reported side effects Blood pressure today 154/57 monitored at home routinely with this morning's level 138/61 Follows closely with PCP and cardiology for secondary stroke prevention measures  No concerns at this time    History provided for reference purposes only Update 12/22/2019 JM: [name] is a 84 year old male who is being seen today, 12/22/2019, for stroke follow-up accompanied by his wife.  He has been stable since prior visit from a stroke standpoint.  Continues on Eliquis and Mevacor for secondary stroke prevention without side effects.  Blood pressure today 134/54. He does have concerns regarding right leg redness and oozing and wife is concerned for possible infection.  Recently had follow-up with vascular surgery for PAD in 1/27.  No further concerns at this time.  Initial visit 06/18/2019 Dr. Leonie [name]: [name] is a 84 year old Caucasian male seen today for initial office follow-up visit following hospital admission for stroke in March 2020.  History is obtained from the patient and his wife was present throughout this visit as well as review of electronic medical records.  I personally reviewed imaging films in PACS.[name] an 84 y.o.malewho presented to the [facility] ED in the late afternoon on 01/04/2019 with slurred speech first noticed at noon. LKW 01/04/2019 at 1200. When he arrived at the ED,  right sided facial droop and slurred speech were noted. After Teleneurology evaluation, he was out of the time window for tPA. CTA of head and neck with CTP were obtained, revealing a left MCA occlusion and significant perfusion deficit in the left MCA territory of 95 cc. NIHSS was 10. His mRS was rated as 0 in the context of a left AKA. He is on Coumadin for atrial fibrillation. His INR was 1.8.he was transferred to [facility] for mechanical thrombectomy.  His NIH on admission was 10 and modified Rankin at baseline was 2 due to amputation.  Patient presented outside time window for TPA.  He underwent successful mechanical thrombectomy with complete revascularization of the left M1 occlusion.  He was kept in the intensive care unit and did very well.  Blood pressure was tightly controlled.  He was extubated but had to have a groin arterial sheath for 2 days still his INR came down to 1.7 before she it could be discontinued.  Neurological exam did show full recovery with NIH stroke scale of 0.  He was started initially on IV heparin and subsequently changed to Eliquis at discharge.  Patient states he has done well since discharge.  Is tolerating Eliquis well without bruising or bleeding.  His blood pressures well controlled today it is 144/64.  Is tolerating Mevacor well without muscle aches or pains.  He is able to ambulate with his artificial leg using a walker.  He is careful and has had no falls.  He does use a wheelchair for long distances.  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  He has had no recurrent stroke or TIA symptoms    ROS:   14 system review of systems is positive for no complaints and all other systems negative    PMH:  Past Medical History:  Diagnosis Date  . Arteriosclerotic cardiovascular disease (ASCVD)    CABG in 1990s; 2002 total obstruction of LAD, CX and RCA with patent grafts and nl EF; Stress nuc. 2008 - mild LV  dilation; normal EF; questionable small anteroapical scar; no ischemia  . Arthritis    "left knee" (11/08/2015)  . Atrial fibrillation ([place])   . Cellulitis 11/08/2015   "both feet"  . Chronic anticoagulation   . Dental crowns present   . Diabetic peripheral neuropathy (HCC)    bilateral lower leg, hands  . History of blood transfusion    "related to my leg surgery?"  . Hypertension    states under control with meds., has been on med. x "long time"  . Iron deficiency anemia   . Jaw cancer ([place]) 1990s   "squamous cell"  . Myocardial infarction ([place]) 1990s   "after my jaw OR"  . Pedal edema    "not a lot", per pt.  . Peripheral vascular disease ([place])   . Presence of retained hardware 07/2015   failed hardware jaw  . Runny nose 07/27/2015   clear drainage, per pt.  . Stroke ([place])   . Type II diabetes mellitus ([place])    "diet controlled" (11/08/2015)    Social History:  Social History   Socioeconomic History  . Marital status: Married    Spouse name: Not on file  . Number of children: Not on file  . Years of education: Not on file  . Highest education level: Not on file  Occupational History  . Not on file  Tobacco Use  . Smoking status: Never Smoker  . Smokeless tobacco: Never Used  Vaping Use  . Vaping Use: Never used  Substance and Sexual Activity  . Alcohol use: No    Alcohol/week: 0.0 standard drinks  . Drug use: No  . Sexual activity: Yes  Other Topics Concern  . Not on file  Social History Narrative  . Not on file   Social Determinants of Health   Financial Resource Strain:   . Difficulty of Paying Living Expenses: Not on file  Food Insecurity:   . Worried About Charity fundraiser in the Last Year: Not on file  . Ran Out of Food in the Last Year: Not on file  Transportation Needs:   . Lack of Transportation (Medical): Not on file  . Lack of Transportation (Non-Medical): Not on file  Physical Activity:   . Days of Exercise per Week: Not on file  .  Minutes of Exercise per Session: Not on file  Stress:   . Feeling of Stress : Not on file  Social Connections:   . Frequency of Communication with Friends and Family: Not on file  . Frequency of Social Gatherings with Friends and Family: Not on file  . Attends Religious Services: Not on file  . Active Member of Clubs or Organizations: Not on file  . Attends Archivist Meetings: Not on file  . Marital Status: Not on file  Intimate Partner Violence:   . Fear of Current or Ex-Partner: Not on file  . Emotionally Abused: Not on file  . Physically Abused: Not on file  . Sexually Abused: Not on file    Medications:  Current Outpatient Medications on File Prior to Visit  Medication Sig Dispense Refill  . amLODipine (NORVASC) 10 MG tablet TAKE 1 TABLET DAILY 90 tablet 3  . cetirizine (ZYRTEC) 10 MG tablet Take 10 mg by mouth daily.    Marland Kitchen ELIQUIS 5 MG TABS tablet TAKE 1 TABLET TWICE A DAY 180 tablet 0  . feeding supplement (BOOST / RESOURCE BREEZE) LIQD Take 1 Container by mouth 2 (two) times daily.    . ferrous sulfate 325 (65 FE) MG tablet Take 65 mg by mouth daily with breakfast.    . losartan-hydrochlorothiazide (HYZAAR) 100-12.5 MG tablet Take 1 tablet by mouth daily.    Marland Kitchen lovastatin (MEVACOR) 10 MG tablet Take 10 mg by mouth daily.     . nebivolol (BYSTOLIC) 10 MG tablet Take 10 mg by mouth daily.    Marland Kitchen nystatin cream (MYCOSTATIN) Apply 1 application topically 3 (three) times daily.    . Omega-3 Fatty Acids (FISH OIL) 1200 MG CAPS Take 1,200 mg by mouth daily.    . Pediatric Multivitamins-Iron (FLINTSTONES PLUS IRON PO) Take 1 tablet by mouth daily.    . Probiotic Product (PHILLIPS COLON HEALTH PO) Take 1 capsule by mouth daily.     . silver sulfADIAZINE (SILVADENE) 1 % cream Apply 1 application topically daily.     No current facility-administered medications on file prior to visit.    Allergies:   Allergies  Allergen Reactions  . Lipitor [Atorvastatin] Other (See  Comments)    SEVERE HEADACHE  . Simvastatin Other (See Comments)    MUSCLE ACHES  . Penicillins Rash    Has patient had a PCN reaction causing immediate rash, facial/tongue/throat swelling, SOB or lightheadedness with hypotension: Yes Has patient had a PCN reaction causing severe rash involving mucus membranes or skin necrosis: No Has patient had a PCN reaction that required hospitalization No Has patient had a PCN reaction occurring within the last 10 years: No If all of the above answers are "NO", then may proceed with Cephalosporin use.   . Sulfa Antibiotics Rash    Tolerates silver sulfadiazine cream at home    Physical Exam  Today's Vitals   06/23/20 1524  BP: (!) 154/57  Pulse: (!) 58  Weight: 177 lb (80.3 kg)  Height: 5' 8"  (1.727 m)   Body mass index is 26.91 kg/m.  General: well developed, well nourished elderly Caucasian male, seated, in no evident distress Head: head normocephalic and atraumatic.  Neck: supple with no carotid or supraclavicular bruits Cardiovascular: irregular rate and rhythm, no murmurs Musculoskeletal:  left AKA Vascular:  Normal pulses all extremities  Neurologic Exam Mental Status: Awake and fully alert.  Fluent speech and language.  Oriented to place and time. Recent and remote memory intact. Attention span, concentration and fund of knowledge appropriate. Mood and affect appropriate.  Cranial Nerves: Pupils equal, briskly reactive to light. Extraocular movements full without nystagmus. Visual fields full to confrontation. Hearing intact. Facial sensation intact. Face, tongue, palate moves normally and symmetrically.  Motor: Normal bulk and tone. Normal strength in all tested extremity muscles with left AKA Sensory.: intact to touch ,pinprick .position and vibratory sensation.  Coordination: Rapid alternating movements normal in all extremities. Finger-to-nose and heel-to-shin performed accurately bilaterally. Gait and Station: Deferred as  currently in wheelchair without prosthetic left leg Reflexes: 1+ and symmetric. Toes downgoing.       ASSESSMENT: 84 year old Caucasian male with embolic left MCA infarct from left MCA occlusion from atrial fibrillation treated with successful mechanical thrombectomy  with excellent clinical outcome and no residual deficits.  Vascular risk factors of atrial fibrillation, diabetes, hypertension and hyperlipidemia and peripheral vascular disease.     PLAN: -Continue Eliquis (apixaban) daily and Mevacor for secondary stroke prevention -Continue to follow with cardiology for atrial fibrillation and Eliquis management -Continue to follow with PCP for HTN and HLD management -maintain strict control of hypertension with blood pressure goal below 130/90, diabetes with hemoglobin A1c goal below 6.5% and lipids with LDL cholesterol goal below 70 mg/dL. I also advised the patient to eat a healthy diet with plenty of whole grains, cereals, fruits and vegetables, exercise regularly and maintain ideal body weight -he was recommended to use his walker at all times for ambulation and we discussed fall and safety precautions   Overall stable from stroke standpoint and recommend follow-up on an as-needed basis  I spent 20 minutes of face-to-face and non-face-to-face time with patient and wife.  This included previsit chart review, lab review, study review, order entry, electronic health record documentation, patient education education regarding prior stroke, history of atrial fibrillation ongoing use of Eliquis, importance of managing stroke risk factors and answered all questions to patient satisfaction   Frann Rider, [facility]  [facility] [address] [place],  [address]  Phone [phone] Fax [phone] Note: This document was prepared with digital dictation and possible smart phrase technology. Any transcriptional errors that result from this process are  unintentional.

## 2020-06-23 NOTE — Progress Notes (Signed)
I agree with the above plan 

## 2020-07-24 ENCOUNTER — Other Ambulatory Visit: Payer: Self-pay | Admitting: Student

## 2020-07-26 ENCOUNTER — Encounter: Payer: Self-pay | Admitting: *Deleted

## 2020-07-26 NOTE — Telephone Encounter (Signed)
Requested recent labs from Dr Vickey Sages office to confirm labs remain stable and dose is appropriate.

## 2020-07-28 ENCOUNTER — Other Ambulatory Visit: Payer: Self-pay

## 2020-07-28 DIAGNOSIS — I872 Venous insufficiency (chronic) (peripheral): Secondary | ICD-10-CM

## 2020-07-28 DIAGNOSIS — I739 Peripheral vascular disease, unspecified: Secondary | ICD-10-CM

## 2020-07-29 ENCOUNTER — Ambulatory Visit (HOSPITAL_COMMUNITY)
Admission: RE | Admit: 2020-07-29 | Discharge: 2020-07-29 | Disposition: A | Discharge status: Home / Self Care | Payer: Medicare Other | Source: Ambulatory Visit | Attending: Vascular Surgery | Admitting: Vascular Surgery

## 2020-07-29 ENCOUNTER — Other Ambulatory Visit: Payer: Self-pay

## 2020-07-29 ENCOUNTER — Ambulatory Visit (INDEPENDENT_AMBULATORY_CARE_PROVIDER_SITE_OTHER)
Admission: RE | Admit: 2020-07-29 | Discharge: 2020-07-29 | Disposition: A | Discharge status: Home / Self Care | Payer: Medicare Other | Source: Ambulatory Visit | Attending: Vascular Surgery | Admitting: Vascular Surgery

## 2020-07-29 ENCOUNTER — Encounter: Payer: Self-pay | Admitting: *Deleted

## 2020-07-29 ENCOUNTER — Ambulatory Visit (INDEPENDENT_AMBULATORY_CARE_PROVIDER_SITE_OTHER): Payer: Medicare Other | Admitting: Physician Assistant

## 2020-07-29 ENCOUNTER — Other Ambulatory Visit: Payer: Self-pay | Admitting: *Deleted

## 2020-07-29 VITALS — BP 149/75 | HR 60 | Temp 98.3°F | Resp 20 | Ht 68.0 in

## 2020-07-29 DIAGNOSIS — I739 Peripheral vascular disease, unspecified: Secondary | ICD-10-CM

## 2020-07-29 DIAGNOSIS — I872 Venous insufficiency (chronic) (peripheral): Secondary | ICD-10-CM

## 2020-07-29 DIAGNOSIS — I70221 Atherosclerosis of native arteries of extremities with rest pain, right leg: Secondary | ICD-10-CM

## 2020-07-29 NOTE — Progress Notes (Signed)
Office Note     CC:  follow up Requesting Provider:  Lemmie Evens, MD  HPI: [name] is a 84 y.o. ([birth year]) male who presents with complaints of right lower extremity weakness times one month. The patient has a history of combined peripheral vascular disease and chronic venous insufficiency. He is s/p left AKA.  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  He does not use his left prosthesis.  Over the course of the last month, he has noticed inability to elevate his leg on his wedge cushion that he uses to control edema from his venous insufficiency due to pain.  When elevated, his posterior thigh, medial knee, lower leg and into his right heel are painful.  Approximately 3 days ago, while transferring to his truck, he suffered an abrasion to his right shin on his truck door.  He did have an arteriogram back in October 2017 which showed on the right side his only significant finding was the anterior tibial artery was occluded.  History of CVA in 12/2018 with associated with some right-sided weakness.  He has a history of atrial fibrillation and is maintained on Eliquis twice daily.  He is compliant with his medications including his statin.  Arterial Doppler study dated 11/19/2019: On the right side he has a monophasic dorsalis pedis and posterior tibial signal.  ABI is 91%.  This is likely falsely elevated.  Toe pressures 49 mmHg.  Past Medical History:  Diagnosis Date  . Arteriosclerotic cardiovascular disease (ASCVD)    CABG in 1990s; 2002 total obstruction of LAD, CX and RCA with patent grafts and nl EF; Stress nuc. 2008 - mild LV dilation; normal EF; questionable small anteroapical scar; no ischemia  . Arthritis    "left knee" (11/08/2015)  . Atrial fibrillation ([place])   . Cellulitis 11/08/2015   "both feet"  . Chronic anticoagulation   . Dental crowns present   . Diabetic peripheral neuropathy (HCC)     bilateral lower leg, hands  . History of blood transfusion    "related to my leg surgery?"  . Hypertension    states under control with meds., has been on med. x "long time"  . Iron deficiency anemia   . Jaw cancer ([place]) 1990s   "squamous cell"  . Myocardial infarction ([place]) 1990s   "after my jaw OR"  . Pedal edema    "not a lot", per pt.  . Peripheral vascular disease ([place])   . Presence of retained hardware 07/2015   failed hardware jaw  . Runny nose 07/27/2015   clear drainage, per pt.  . Stroke ([place])   . Type II diabetes mellitus ([place])    "diet controlled" (11/08/2015)    Past Surgical History:  Procedure Laterality Date  . AMPUTATION Left 11/11/2015   Procedure: LEFT ABOVE KNEE AMPUTATION;  Surgeon: Angelia Mould, MD;  Location: [facility];  Service: Vascular;  Laterality: Left;  . CARDIAC CATHETERIZATION  1990s; 04/16/2001  . CATARACT EXTRACTION W/ INTRAOCULAR LENS  IMPLANT, BILATERAL Bilateral 09/2015  . COLONOSCOPY N/A 11/27/2014   Procedure: COLONOSCOPY;  Surgeon: Rogene Houston, MD;  [facility];  Service: Endoscopy;  Laterality: N/A;  830  . CORONARY ARTERY BYPASS GRAFT  1990's   "CABG X4"  . ESOPHAGOGASTRODUODENOSCOPY N/A 02/14/2018   Procedure: ESOPHAGOGASTRODUODENOSCOPY (EGD);  Surgeon: Rogene Houston, MD;  [facility];  Service: Endoscopy;  Laterality: N/A;  . ESOPHAGOGASTRODUODENOSCOPY (EGD) WITH  PROPOFOL N/A 02/25/2018   Procedure: ESOPHAGOGASTRODUODENOSCOPY (EGD) WITH PROPOFOL;  Surgeon: Rogene Houston, MD;  [facility];  Service: Endoscopy;  Laterality: N/A;  . FRACTURE SURGERY    . GIVENS CAPSULE STUDY N/A 02/15/2018   Procedure: GIVENS CAPSULE STUDY;  Surgeon: Rogene Houston, MD;  [facility];  Service: Endoscopy;  Laterality: N/A;  . GIVENS CAPSULE STUDY N/A 03/27/2018   Procedure: GIVENS CAPSULE STUDY;  Surgeon: Rogene Houston, MD;  [facility];  Service: Endoscopy;  Laterality: N/A;  . I & D  EXTREMITY Left 11/10/2015   Procedure: INCISION AND DRAINAGE LEFT FOOT, AMPUTATION OF LEFT THIRD TOE;  Surgeon: Conrad [place], MD;  Location: [facility];  Service: Vascular;  Laterality: Left;  . INCISION AND DRAINAGE ABSCESS Left 06/16/2005   wide exc. abscess 5th toe  . IR ANGIOGRAM VISCERAL SELECTIVE  03/28/2018  . IR ANGIOGRAM VISCERAL SELECTIVE  03/28/2018  . IR CT HEAD LTD  01/04/2019  . IR PERCUTANEOUS ART THROMBECTOMY/INFUSION INTRACRANIAL INC DIAG ANGIO  01/04/2019  . IR US GUIDE VASC ACCESS LEFT  03/28/2018  . MANDIBULAR HARDWARE REMOVAL Left 08/02/2015   Procedure:  HARDWARE REMOVAL TWO MANDIBULAR SCREWS;  Surgeon: Jannette Fogo, DDS;  Location: [facility];  Service: Oral Surgery;  Laterality: Left;  . ORIF TIBIA FRACTURE Left ~ 1970  . PERIPHERAL VASCULAR CATHETERIZATION N/A 08/14/2016   Procedure: Abdominal Aortogram w/Lower Extremity;  Surgeon: Angelia Mould, MD;  Location: [facility];  Service: Cardiovascular;  Laterality: N/A;  . RADIOLOGY WITH ANESTHESIA N/A 01/04/2019   Procedure: CODE STROKE;  Surgeon: Radiologist, Medication, MD;  Location: [facility];  Service: Radiology;  Laterality: N/A;  . SQUAMOUS CELL CARCINOMA EXCISION Left 1993   "took my jaw out; got cadavar in there now"  . TONSILLECTOMY      Social History   Socioeconomic History  . Marital status: Married    Spouse name: Not on file  . Number of children: Not on file  . Years of education: Not on file  . Highest education level: Not on file  Occupational History  . Not on file  Tobacco Use  . Smoking status: Never Smoker  . Smokeless tobacco: Never Used  Vaping Use  . Vaping Use: Never used  Substance and Sexual Activity  . Alcohol use: No    Alcohol/week: 0.0 standard drinks  . Drug use: No  . Sexual activity: Yes  Other Topics Concern  . Not on file  Social History Narrative  . Not on file   Social Determinants of Health   Financial Resource Strain:   . Difficulty of Paying  Living Expenses: Not on file  Food Insecurity:   . Worried About Charity fundraiser in the Last Year: Not on file  . Ran Out of Food in the Last Year: Not on file  Transportation Needs:   . Lack of Transportation (Medical): Not on file  . Lack of Transportation (Non-Medical): Not on file  Physical Activity:   . Days of Exercise per Week: Not on file  . Minutes of Exercise per Session: Not on file  Stress:   . Feeling of Stress : Not on file  Social Connections:   . Frequency of Communication with Friends and Family: Not on file  . Frequency of Social Gatherings with Friends and Family: Not on file  . Attends Religious Services: Not on file  . Active Member of Clubs or Organizations: Not on  file  . Attends Archivist Meetings: Not on file  . Marital Status: Not on file  Intimate Partner Violence:   . Fear of Current or Ex-Partner: Not on file  . Emotionally Abused: Not on file  . Physically Abused: Not on file  . Sexually Abused: Not on file   Family History  Problem Relation Age of Onset  . Heart disease Mother     Current Outpatient Medications  Medication Sig Dispense Refill  . amLODipine (NORVASC) 10 MG tablet TAKE 1 TABLET DAILY 90 tablet 3  . cetirizine (ZYRTEC) 10 MG tablet Take 10 mg by mouth daily.    Marland Kitchen ELIQUIS 5 MG TABS tablet TAKE 1 TABLET TWICE A DAY 180 tablet 0  . feeding supplement (BOOST / RESOURCE BREEZE) LIQD Take 1 Container by mouth 2 (two) times daily.    . ferrous sulfate 325 (65 FE) MG tablet Take 65 mg by mouth daily with breakfast.    . losartan-hydrochlorothiazide (HYZAAR) 100-12.5 MG tablet Take 1 tablet by mouth daily.    Marland Kitchen lovastatin (MEVACOR) 10 MG tablet Take 10 mg by mouth daily.     . nebivolol (BYSTOLIC) 10 MG tablet Take 10 mg by mouth daily.    Marland Kitchen nystatin cream (MYCOSTATIN) Apply 1 application topically 3 (three) times daily.    . Omega-3 Fatty Acids (FISH OIL) 1200 MG CAPS Take 1,200 mg by mouth daily.    . Pediatric  Multivitamins-Iron (FLINTSTONES PLUS IRON PO) Take 1 tablet by mouth daily.    . Probiotic Product (PHILLIPS COLON HEALTH PO) Take 1 capsule by mouth daily.     . silver sulfADIAZINE (SILVADENE) 1 % cream Apply 1 application topically daily.     No current facility-administered medications for this visit.    Allergies  Allergen Reactions  . Lipitor [Atorvastatin] Other (See Comments)    SEVERE HEADACHE  . Simvastatin Other (See Comments)    MUSCLE ACHES  . Penicillins Rash    Has patient had a PCN reaction causing immediate rash, facial/tongue/throat swelling, SOB or lightheadedness with hypotension: Yes Has patient had a PCN reaction causing severe rash involving mucus membranes or skin necrosis: No Has patient had a PCN reaction that required hospitalization No Has patient had a PCN reaction occurring within the last 10 years: No If all of the above answers are "NO", then may proceed with Cephalosporin use.   . Sulfa Antibiotics Rash    Tolerates silver sulfadiazine cream at home     REVIEW OF SYSTEMS:   [X]  denotes positive finding, [ ]  denotes negative finding Cardiac  Comments:  Chest pain or chest pressure:    Shortness of breath upon exertion:    Short of breath when lying flat:    Irregular heart rhythm: x       Vascular    Pain in calf, thigh, or hip brought on by ambulation:    Pain in feet at night that wakes you up from your sleep:  x   Blood clot in your veins:    Leg swelling:  x       Pulmonary    Oxygen at home:    Productive cough:     Wheezing:         Neurologic    Sudden weakness in arms or legs:     Sudden numbness in arms or legs:     Sudden onset of difficulty speaking or slurred speech:    Temporary loss of vision in one eye:  Problems with dizziness:         Gastrointestinal    Blood in stool:     Vomited blood:         Genitourinary    Burning when urinating:     Blood in urine:        Psychiatric    Major depression:           Hematologic    Bleeding problems:    Problems with blood clotting too easily:        Skin    Rashes or ulcers:        Constitutional    Fever or chills:      PHYSICAL EXAMINATION:  Vitals:   07/29/20 1337  BP: (!) 149/75  Pulse: 60  Resp: 20  Temp: 98.3 F (36.8 C)  SpO2: 99%   General:  WDWN in NAD; vital signs documented above Gait: Not observed. HENT: WNL, normocephalic Pulmonary: normal non-labored breathing  Cardiac: irregular HR, without  Murmurs Skin: with stasis dermatitis changes to right lower leg gaiter region  Vascular Exam/Pulses: 1-2+ bilateral femoral pulses Monophasic Doppler PT, DP and peroneal signals Extremities: with chronic ischemic changes (smooth, shiny skin, without Gangrene , without cellulitis; with open wounds>>2 cm by 0.5 cm abrasion of right anterior shin Musculoskeletal: no muscle wasting or atrophy  Neurologic: A&O X 3;  No focal weakness or paresthesias are detected Psychiatric:  The pt has Normal affect.      Non-Invasive Vascular Imaging:   07/29/2020  ABI/TBIToday's ABIToday's TBIPrevious ABIPrevious TBI  +-------+-----------+-----------+------------+------------+  Right 1.21    0.47    0.91    0.32      +-------+-----------+-----------+------------+------------+  Left  AKA    AKA    AKA     AKA       +-------+-----------+-----------+------------+------------+ Great toe pressure = 79  RLE Duplex: Proximal CIA 50-74% stenosis with PSV of 423 cm/sec  ASSESSMENT/PLAN:: 84 y.o. male here for follow up for new onset of right lower extremity pain with elevation, generalized right lower extremity weakness.  ABIs unreliable due to calcified vessels.  Right lower extremity duplex today reveals proximal common iliac artery stenosis estimated at 50 to 74%.  I discussed the findings with Dr. Scot Dock who examined the patient and discussed the findings with the patient and his wife. Recommend  RLE arteriogram with possible intervention.  The patient and his wife are in agreement with this plan.  I advised him not to wear his compression stocking unless he develops worsening edema or worsening appearance of his right shin abrasion.  I did advise him to call our office if he felt this area was worsening prior to his arteriogram.  Barbie Banner, PA-C [facility] [phone]  Clinic MD:   Scot Dock

## 2020-07-29 NOTE — H&P (View-Only) (Signed)
Office Note     CC:  follow up Requesting Provider:  Lemmie Evens, MD  HPI: [name] is a 84 y.o. (1935/10/25) male who presents with complaints of right lower extremity weakness times one month. The patient has a history of combined peripheral vascular disease and chronic venous insufficiency. He is s/p left AKA.  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  He does not use his left prosthesis.  Over the course of the last month, he has noticed inability to elevate his leg on his wedge cushion that he uses to control edema from his venous insufficiency due to pain.  When elevated, his posterior thigh, medial knee, lower leg and into his right heel are painful.  Approximately 3 days ago, while transferring to his truck, he suffered an abrasion to his right shin on his truck door.  He did have an arteriogram back in October 2017 which showed on the right side his only significant finding was the anterior tibial artery was occluded.  History of CVA in 12/2018 with associated with some right-sided weakness.  He has a history of atrial fibrillation and is maintained on Eliquis twice daily.  He is compliant with his medications including his statin.  Arterial Doppler study dated 11/19/2019: On the right side he has a monophasic dorsalis pedis and posterior tibial signal.  ABI is 91%.  This is likely falsely elevated.  Toe pressures 49 mmHg.  Past Medical History:  Diagnosis Date  . Arteriosclerotic cardiovascular disease (ASCVD)    CABG in 1990s; 2002 total obstruction of LAD, CX and RCA with patent grafts and nl EF; Stress nuc. 2008 - mild LV dilation; normal EF; questionable small anteroapical scar; no ischemia  . Arthritis    "left knee" (11/08/2015)  . Atrial fibrillation ([place])   . Cellulitis 11/08/2015   "both feet"  . Chronic anticoagulation   . Dental crowns present   . Diabetic peripheral neuropathy (HCC)     bilateral lower leg, hands  . History of blood transfusion    "related to my leg surgery?"  . Hypertension    states under control with meds., has been on med. x "long time"  . Iron deficiency anemia   . Jaw cancer ([place]) 1990s   "squamous cell"  . Myocardial infarction ([place]) 1990s   "after my jaw OR"  . Pedal edema    "not a lot", per pt.  . Peripheral vascular disease ([place])   . Presence of retained hardware 07/2015   failed hardware jaw  . Runny nose 07/27/2015   clear drainage, per pt.  . Stroke ([place])   . Type II diabetes mellitus ([place])    "diet controlled" (11/08/2015)    Past Surgical History:  Procedure Laterality Date  . AMPUTATION Left 11/11/2015   Procedure: LEFT ABOVE KNEE AMPUTATION;  Surgeon: Angelia Mould, MD;  Location: [facility];  Service: Vascular;  Laterality: Left;  . CARDIAC CATHETERIZATION  1990s; 04/16/2001  . CATARACT EXTRACTION W/ INTRAOCULAR LENS  IMPLANT, BILATERAL Bilateral 09/2015  . COLONOSCOPY N/A 11/27/2014   Procedure: COLONOSCOPY;  Surgeon: Rogene Houston, MD;  [facility];  Service: Endoscopy;  Laterality: N/A;  830  . CORONARY ARTERY BYPASS GRAFT  1990's   "CABG X4"  . ESOPHAGOGASTRODUODENOSCOPY N/A 02/14/2018   Procedure: ESOPHAGOGASTRODUODENOSCOPY (EGD);  Surgeon: Rogene Houston, MD;  [facility];  Service: Endoscopy;  Laterality: N/A;  . ESOPHAGOGASTRODUODENOSCOPY (EGD) WITH  PROPOFOL N/A 02/25/2018   Procedure: ESOPHAGOGASTRODUODENOSCOPY (EGD) WITH PROPOFOL;  Surgeon: Rogene Houston, MD;  [facility];  Service: Endoscopy;  Laterality: N/A;  . FRACTURE SURGERY    . GIVENS CAPSULE STUDY N/A 02/15/2018   Procedure: GIVENS CAPSULE STUDY;  Surgeon: Rogene Houston, MD;  [facility];  Service: Endoscopy;  Laterality: N/A;  . GIVENS CAPSULE STUDY N/A 03/27/2018   Procedure: GIVENS CAPSULE STUDY;  Surgeon: Rogene Houston, MD;  [facility];  Service: Endoscopy;  Laterality: N/A;  . I & D  EXTREMITY Left 11/10/2015   Procedure: INCISION AND DRAINAGE LEFT FOOT, AMPUTATION OF LEFT THIRD TOE;  Surgeon: Conrad Chester, MD;  Location: [facility];  Service: Vascular;  Laterality: Left;  . INCISION AND DRAINAGE ABSCESS Left 06/16/2005   wide exc. abscess 5th toe  . IR ANGIOGRAM VISCERAL SELECTIVE  03/28/2018  . IR ANGIOGRAM VISCERAL SELECTIVE  03/28/2018  . IR CT HEAD LTD  01/04/2019  . IR PERCUTANEOUS ART THROMBECTOMY/INFUSION INTRACRANIAL INC DIAG ANGIO  01/04/2019  . IR US GUIDE VASC ACCESS LEFT  03/28/2018  . MANDIBULAR HARDWARE REMOVAL Left 08/02/2015   Procedure:  HARDWARE REMOVAL TWO MANDIBULAR SCREWS;  Surgeon: Jannette Fogo, DDS;  Location: [facility];  Service: Oral Surgery;  Laterality: Left;  . ORIF TIBIA FRACTURE Left ~ 1970  . PERIPHERAL VASCULAR CATHETERIZATION N/A 08/14/2016   Procedure: Abdominal Aortogram w/Lower Extremity;  Surgeon: Angelia Mould, MD;  Location: [facility];  Service: Cardiovascular;  Laterality: N/A;  . RADIOLOGY WITH ANESTHESIA N/A 01/04/2019   Procedure: CODE STROKE;  Surgeon: Radiologist, Medication, MD;  Location: [facility];  Service: Radiology;  Laterality: N/A;  . SQUAMOUS CELL CARCINOMA EXCISION Left 1993   "took my jaw out; got cadavar in there now"  . TONSILLECTOMY      Social History   Socioeconomic History  . Marital status: Married    Spouse name: Not on file  . Number of children: Not on file  . Years of education: Not on file  . Highest education level: Not on file  Occupational History  . Not on file  Tobacco Use  . Smoking status: Never Smoker  . Smokeless tobacco: Never Used  Vaping Use  . Vaping Use: Never used  Substance and Sexual Activity  . Alcohol use: No    Alcohol/week: 0.0 standard drinks  . Drug use: No  . Sexual activity: Yes  Other Topics Concern  . Not on file  Social History Narrative  . Not on file   Social Determinants of Health   Financial Resource Strain:   . Difficulty of Paying  Living Expenses: Not on file  Food Insecurity:   . Worried About Charity fundraiser in the Last Year: Not on file  . Ran Out of Food in the Last Year: Not on file  Transportation Needs:   . Lack of Transportation (Medical): Not on file  . Lack of Transportation (Non-Medical): Not on file  Physical Activity:   . Days of Exercise per Week: Not on file  . Minutes of Exercise per Session: Not on file  Stress:   . Feeling of Stress : Not on file  Social Connections:   . Frequency of Communication with Friends and Family: Not on file  . Frequency of Social Gatherings with Friends and Family: Not on file  . Attends Religious Services: Not on file  . Active Member of Clubs or Organizations: Not on  file  . Attends Archivist Meetings: Not on file  . Marital Status: Not on file  Intimate Partner Violence:   . Fear of Current or Ex-Partner: Not on file  . Emotionally Abused: Not on file  . Physically Abused: Not on file  . Sexually Abused: Not on file   Family History  Problem Relation Age of Onset  . Heart disease Mother     Current Outpatient Medications  Medication Sig Dispense Refill  . amLODipine (NORVASC) 10 MG tablet TAKE 1 TABLET DAILY 90 tablet 3  . cetirizine (ZYRTEC) 10 MG tablet Take 10 mg by mouth daily.    Marland Kitchen ELIQUIS 5 MG TABS tablet TAKE 1 TABLET TWICE A DAY 180 tablet 0  . feeding supplement (BOOST / RESOURCE BREEZE) LIQD Take 1 Container by mouth 2 (two) times daily.    . ferrous sulfate 325 (65 FE) MG tablet Take 65 mg by mouth daily with breakfast.    . losartan-hydrochlorothiazide (HYZAAR) 100-12.5 MG tablet Take 1 tablet by mouth daily.    Marland Kitchen lovastatin (MEVACOR) 10 MG tablet Take 10 mg by mouth daily.     . nebivolol (BYSTOLIC) 10 MG tablet Take 10 mg by mouth daily.    Marland Kitchen nystatin cream (MYCOSTATIN) Apply 1 application topically 3 (three) times daily.    . Omega-3 Fatty Acids (FISH OIL) 1200 MG CAPS Take 1,200 mg by mouth daily.    . Pediatric  Multivitamins-Iron (FLINTSTONES PLUS IRON PO) Take 1 tablet by mouth daily.    . Probiotic Product (PHILLIPS COLON HEALTH PO) Take 1 capsule by mouth daily.     . silver sulfADIAZINE (SILVADENE) 1 % cream Apply 1 application topically daily.     No current facility-administered medications for this visit.    Allergies  Allergen Reactions  . Lipitor [Atorvastatin] Other (See Comments)    SEVERE HEADACHE  . Simvastatin Other (See Comments)    MUSCLE ACHES  . Penicillins Rash    Has patient had a PCN reaction causing immediate rash, facial/tongue/throat swelling, SOB or lightheadedness with hypotension: Yes Has patient had a PCN reaction causing severe rash involving mucus membranes or skin necrosis: No Has patient had a PCN reaction that required hospitalization No Has patient had a PCN reaction occurring within the last 10 years: No If all of the above answers are "NO", then may proceed with Cephalosporin use.   . Sulfa Antibiotics Rash    Tolerates silver sulfadiazine cream at home     REVIEW OF SYSTEMS:   [X]  denotes positive finding, [ ]  denotes negative finding Cardiac  Comments:  Chest pain or chest pressure:    Shortness of breath upon exertion:    Short of breath when lying flat:    Irregular heart rhythm: x       Vascular    Pain in calf, thigh, or hip brought on by ambulation:    Pain in feet at night that wakes you up from your sleep:  x   Blood clot in your veins:    Leg swelling:  x       Pulmonary    Oxygen at home:    Productive cough:     Wheezing:         Neurologic    Sudden weakness in arms or legs:     Sudden numbness in arms or legs:     Sudden onset of difficulty speaking or slurred speech:    Temporary loss of vision in one eye:  Problems with dizziness:         Gastrointestinal    Blood in stool:     Vomited blood:         Genitourinary    Burning when urinating:     Blood in urine:        Psychiatric    Major depression:           Hematologic    Bleeding problems:    Problems with blood clotting too easily:        Skin    Rashes or ulcers:        Constitutional    Fever or chills:      PHYSICAL EXAMINATION:  Vitals:   07/29/20 1337  BP: (!) 149/75  Pulse: 60  Resp: 20  Temp: 98.3 F (36.8 C)  SpO2: 99%   General:  WDWN in NAD; vital signs documented above Gait: Not observed. HENT: WNL, normocephalic Pulmonary: normal non-labored breathing  Cardiac: irregular HR, without  Murmurs Skin: with stasis dermatitis changes to right lower leg gaiter region  Vascular Exam/Pulses: 1-2+ bilateral femoral pulses Monophasic Doppler PT, DP and peroneal signals Extremities: with chronic ischemic changes (smooth, shiny skin, without Gangrene , without cellulitis; with open wounds>>2 cm by 0.5 cm abrasion of right anterior shin Musculoskeletal: no muscle wasting or atrophy  Neurologic: A&O X 3;  No focal weakness or paresthesias are detected Psychiatric:  The pt has Normal affect.      Non-Invasive Vascular Imaging:   07/29/2020  ABI/TBIToday's ABIToday's TBIPrevious ABIPrevious TBI  +-------+-----------+-----------+------------+------------+  Right 1.21    0.47    0.91    0.32      +-------+-----------+-----------+------------+------------+  Left  AKA    AKA    AKA     AKA       +-------+-----------+-----------+------------+------------+ Great toe pressure = 79  RLE Duplex: Proximal CIA 50-74% stenosis with PSV of 423 cm/sec  ASSESSMENT/PLAN:: 84 y.o. male here for follow up for new onset of right lower extremity pain with elevation, generalized right lower extremity weakness.  ABIs unreliable due to calcified vessels.  Right lower extremity duplex today reveals proximal common iliac artery stenosis estimated at 50 to 74%.  I discussed the findings with Dr. Scot Dock who examined the patient and discussed the findings with the patient and his wife. Recommend  RLE arteriogram with possible intervention.  The patient and his wife are in agreement with this plan.  I advised him not to wear his compression stocking unless he develops worsening edema or worsening appearance of his right shin abrasion.  I did advise him to call our office if he felt this area was worsening prior to his arteriogram.  Barbie Banner, PA-C [facility] [phone]  Clinic MD:   Scot Dock

## 2020-08-19 ENCOUNTER — Other Ambulatory Visit (HOSPITAL_COMMUNITY)
Admission: RE | Admit: 2020-08-19 | Discharge: 2020-08-19 | Disposition: A | Discharge status: Home / Self Care | Payer: Medicare Other | Source: Ambulatory Visit | Attending: Vascular Surgery | Admitting: Vascular Surgery

## 2020-08-19 ENCOUNTER — Other Ambulatory Visit: Payer: Self-pay

## 2020-08-19 ENCOUNTER — Other Ambulatory Visit (HOSPITAL_COMMUNITY): Payer: Medicare Other

## 2020-08-19 DIAGNOSIS — Z20822 Contact with and (suspected) exposure to covid-19: Secondary | ICD-10-CM | POA: Insufficient documentation

## 2020-08-19 DIAGNOSIS — Z01812 Encounter for preprocedural laboratory examination: Secondary | ICD-10-CM | POA: Insufficient documentation

## 2020-08-19 LAB — SARS CORONAVIRUS 2 (TAT 6-24 HRS): SARS Coronavirus 2: NEGATIVE

## 2020-08-20 ENCOUNTER — Ambulatory Visit (HOSPITAL_COMMUNITY)
Admission: RE | Admit: 2020-08-20 | Discharge: 2020-08-20 | Disposition: A | Discharge status: Home / Self Care | Payer: Medicare Other | Attending: Vascular Surgery | Admitting: Vascular Surgery

## 2020-08-20 ENCOUNTER — Encounter (HOSPITAL_COMMUNITY)
Admission: RE | Disposition: A | Discharge status: Home / Self Care | Payer: Self-pay | Source: Home / Self Care | Attending: Vascular Surgery

## 2020-08-20 ENCOUNTER — Other Ambulatory Visit: Payer: Self-pay

## 2020-08-20 DIAGNOSIS — E11621 Type 2 diabetes mellitus with foot ulcer: Secondary | ICD-10-CM | POA: Insufficient documentation

## 2020-08-20 DIAGNOSIS — Z880 Allergy status to penicillin: Secondary | ICD-10-CM | POA: Diagnosis not present

## 2020-08-20 DIAGNOSIS — I10 Essential (primary) hypertension: Secondary | ICD-10-CM | POA: Diagnosis not present

## 2020-08-20 DIAGNOSIS — Z888 Allergy status to other drugs, medicaments and biological substances status: Secondary | ICD-10-CM | POA: Insufficient documentation

## 2020-08-20 DIAGNOSIS — Z89612 Acquired absence of left leg above knee: Secondary | ICD-10-CM | POA: Insufficient documentation

## 2020-08-20 DIAGNOSIS — Z951 Presence of aortocoronary bypass graft: Secondary | ICD-10-CM | POA: Insufficient documentation

## 2020-08-20 DIAGNOSIS — E1142 Type 2 diabetes mellitus with diabetic polyneuropathy: Secondary | ICD-10-CM | POA: Diagnosis not present

## 2020-08-20 DIAGNOSIS — E1151 Type 2 diabetes mellitus with diabetic peripheral angiopathy without gangrene: Secondary | ICD-10-CM | POA: Diagnosis present

## 2020-08-20 DIAGNOSIS — I872 Venous insufficiency (chronic) (peripheral): Secondary | ICD-10-CM | POA: Insufficient documentation

## 2020-08-20 DIAGNOSIS — I252 Old myocardial infarction: Secondary | ICD-10-CM | POA: Diagnosis not present

## 2020-08-20 DIAGNOSIS — I4891 Unspecified atrial fibrillation: Secondary | ICD-10-CM | POA: Insufficient documentation

## 2020-08-20 DIAGNOSIS — Z8249 Family history of ischemic heart disease and other diseases of the circulatory system: Secondary | ICD-10-CM | POA: Insufficient documentation

## 2020-08-20 DIAGNOSIS — I70232 Atherosclerosis of native arteries of right leg with ulceration of calf: Secondary | ICD-10-CM | POA: Diagnosis not present

## 2020-08-20 DIAGNOSIS — Z7901 Long term (current) use of anticoagulants: Secondary | ICD-10-CM | POA: Diagnosis not present

## 2020-08-20 DIAGNOSIS — Z882 Allergy status to sulfonamides status: Secondary | ICD-10-CM | POA: Insufficient documentation

## 2020-08-20 DIAGNOSIS — I70238 Atherosclerosis of native arteries of right leg with ulceration of other part of lower right leg: Secondary | ICD-10-CM | POA: Diagnosis not present

## 2020-08-20 DIAGNOSIS — Z8673 Personal history of transient ischemic attack (TIA), and cerebral infarction without residual deficits: Secondary | ICD-10-CM | POA: Diagnosis not present

## 2020-08-20 DIAGNOSIS — I2510 Atherosclerotic heart disease of native coronary artery without angina pectoris: Secondary | ICD-10-CM | POA: Insufficient documentation

## 2020-08-20 DIAGNOSIS — I70222 Atherosclerosis of native arteries of extremities with rest pain, left leg: Secondary | ICD-10-CM | POA: Diagnosis not present

## 2020-08-20 HISTORY — PX: PERIPHERAL VASCULAR INTERVENTION: CATH118257

## 2020-08-20 HISTORY — PX: ABDOMINAL AORTOGRAM W/LOWER EXTREMITY: CATH118223

## 2020-08-20 LAB — POCT ACTIVATED CLOTTING TIME
Activated Clotting Time: 219 seconds
Activated Clotting Time: 252 seconds
Activated Clotting Time: 175 seconds
Activated Clotting Time: 186 seconds

## 2020-08-20 LAB — POCT I-STAT, CHEM 8
BUN: 27 mg/dL — ABNORMAL HIGH (ref 8–23)
Calcium, Ion: 1.1 mmol/L — ABNORMAL LOW (ref 1.15–1.40)
Chloride: 111 mmol/L (ref 98–111)
Creatinine, Ser: 1.3 mg/dL — ABNORMAL HIGH (ref 0.61–1.24)
Glucose, Bld: 103 mg/dL — ABNORMAL HIGH (ref 70–99)
HCT: 33 % — ABNORMAL LOW (ref 39.0–52.0)
Hemoglobin: 11.2 g/dL — ABNORMAL LOW (ref 13.0–17.0)
Potassium: 4 mmol/L (ref 3.5–5.1)
Sodium: 142 mmol/L (ref 135–145)
TCO2: 19 mmol/L — ABNORMAL LOW (ref 22–32)

## 2020-08-20 LAB — GLUCOSE, CAPILLARY
Glucose-Capillary: 93 mg/dL (ref 70–99)
Glucose-Capillary: 79 mg/dL (ref 70–99)
Glucose-Capillary: 91 mg/dL (ref 70–99)

## 2020-08-20 SURGERY — ABDOMINAL AORTOGRAM W/LOWER EXTREMITY
Anesthesia: LOCAL | Laterality: Bilateral

## 2020-08-20 MED ORDER — LIDOCAINE HCL (PF) 1 % IJ SOLN
INTRAMUSCULAR | Status: AC
  Filled 2020-08-20: qty 30

## 2020-08-20 MED ORDER — ACETAMINOPHEN 325 MG PO TABS: 650.0000 mg | ORAL_TABLET | ORAL | Status: DC | PRN

## 2020-08-20 MED ORDER — SODIUM CHLORIDE 0.9 % WEIGHT BASED INFUSION: 1.0000 mL/kg/h | INTRAVENOUS | Status: DC

## 2020-08-20 MED ORDER — MIDAZOLAM HCL 2 MG/2ML IJ SOLN
INTRAMUSCULAR | Status: DC | PRN
  Administered 2020-08-20: 1 mg via INTRAVENOUS

## 2020-08-20 MED ORDER — SODIUM CHLORIDE 0.9% FLUSH: 3.0000 mL | Freq: Two times a day (BID) | INTRAVENOUS | Status: DC

## 2020-08-20 MED ORDER — SODIUM CHLORIDE 0.9 % IV SOLN: INTRAVENOUS | Status: DC

## 2020-08-20 MED ORDER — CLOPIDOGREL BISULFATE 75 MG PO TABS: 75.0000 mg | ORAL_TABLET | Freq: Every day | ORAL | Status: DC

## 2020-08-20 MED ORDER — FENTANYL CITRATE (PF) 100 MCG/2ML IJ SOLN
INTRAMUSCULAR | Status: AC
  Filled 2020-08-20: qty 2

## 2020-08-20 MED ORDER — SODIUM CHLORIDE 0.9% FLUSH: 3.0000 mL | INTRAVENOUS | Status: DC | PRN

## 2020-08-20 MED ORDER — HEPARIN SODIUM (PORCINE) 1000 UNIT/ML IJ SOLN
INTRAMUSCULAR | Status: AC
  Filled 2020-08-20: qty 1

## 2020-08-20 MED ORDER — IODIXANOL 320 MG/ML IV SOLN
INTRAVENOUS | Status: DC | PRN
  Administered 2020-08-20: 115 mL via INTRA_ARTERIAL

## 2020-08-20 MED ORDER — HEPARIN SODIUM (PORCINE) 1000 UNIT/ML IJ SOLN
INTRAMUSCULAR | Status: DC | PRN
  Administered 2020-08-20: 8000 [IU] via INTRAVENOUS

## 2020-08-20 MED ORDER — FENTANYL CITRATE (PF) 100 MCG/2ML IJ SOLN
INTRAMUSCULAR | Status: DC | PRN
Start: 2020-08-20 — End: 2020-08-20
  Administered 2020-08-20: 50 ug via INTRAVENOUS

## 2020-08-20 MED ORDER — LIDOCAINE HCL (PF) 1 % IJ SOLN
INTRAMUSCULAR | Status: DC | PRN
  Administered 2020-08-20: 30 mL via INTRADERMAL

## 2020-08-20 MED ORDER — HEPARIN (PORCINE) IN NACL 1000-0.9 UT/500ML-% IV SOLN
INTRAVENOUS | Status: AC
  Filled 2020-08-20: qty 1000

## 2020-08-20 MED ORDER — SODIUM CHLORIDE 0.9 % IV SOLN: 250.0000 mL | INTRAVENOUS | Status: DC | PRN

## 2020-08-20 MED ORDER — HYDRALAZINE HCL 20 MG/ML IJ SOLN: 5.0000 mg | INTRAMUSCULAR | Status: DC | PRN

## 2020-08-20 MED ORDER — MIDAZOLAM HCL 2 MG/2ML IJ SOLN
INTRAMUSCULAR | Status: AC
  Filled 2020-08-20: qty 2

## 2020-08-20 MED ORDER — CLOPIDOGREL BISULFATE 75 MG PO TABS: 75.0000 mg | ORAL_TABLET | Freq: Every day | ORAL | 11 refills | Status: DC

## 2020-08-20 MED ORDER — ONDANSETRON HCL 4 MG/2ML IJ SOLN: 4.0000 mg | Freq: Four times a day (QID) | INTRAMUSCULAR | Status: DC | PRN

## 2020-08-20 MED ORDER — HEPARIN (PORCINE) IN NACL 1000-0.9 UT/500ML-% IV SOLN
INTRAVENOUS | Status: DC | PRN
  Administered 2020-08-20 (×2): 500 mL

## 2020-08-20 MED ORDER — LABETALOL HCL 5 MG/ML IV SOLN: 10.0000 mg | INTRAVENOUS | Status: DC | PRN

## 2020-08-20 SURGICAL SUPPLY — 16 items
CATH ANGIO 5F PIGTAIL 65CM (CATHETERS) ×1 IMPLANT
DEVICE CONTINUOUS FLUSH (MISCELLANEOUS) ×1 IMPLANT
KIT ENCORE 26 ADVANTAGE (KITS) ×2 IMPLANT
KIT MICROPUNCTURE NIT STIFF (SHEATH) ×1 IMPLANT
KIT PV (KITS) ×2 IMPLANT
SHEATH BRITE TIP 7FR 35CM (SHEATH) ×2 IMPLANT
SHEATH PINNACLE 5F 10CM (SHEATH) ×1 IMPLANT
SHEATH PROBE COVER 6X72 (BAG) ×2 IMPLANT
STENT VIABAHN 8X29X80 VBX (Permanent Stent) ×1 IMPLANT
STENT VIABAHN 8X39X80 VBX (Permanent Stent) ×1 IMPLANT
STOPCOCK MORSE 400PSI 3WAY (MISCELLANEOUS) ×1 IMPLANT
SYR MEDRAD MARK 7 150ML (SYRINGE) ×2 IMPLANT
TRANSDUCER W/STOPCOCK (MISCELLANEOUS) ×2 IMPLANT
TRAY PV CATH (CUSTOM PROCEDURE TRAY) ×2 IMPLANT
TUBING CIL FLEX 10 FLL-RA (TUBING) ×1 IMPLANT
WIRE HITORQ VERSACORE ST 145CM (WIRE) ×2 IMPLANT

## 2020-08-20 NOTE — Interval H&P Note (Signed)
History and Physical Interval Note:  08/20/2020 12:52 PM  [name]  has presented today for surgery, with the diagnosis of PAD.  The various methods of treatment have been discussed with the patient and family. After consideration of risks, benefits and other options for treatment, the patient has consented to  Procedure(s): ABDOMINAL AORTOGRAM W/LOWER EXTREMITY (Bilateral) as a surgical intervention.  The patient's history has been reviewed, patient examined, no change in status, stable for surgery.  I have reviewed the patient's chart and labs.  Questions were answered to the patient's satisfaction.     Deitra Mayo

## 2020-08-20 NOTE — Op Note (Signed)
PATIENT: [name]      MRN: [id number] DOB: 1936/01/11    DATE OF PROCEDURE: 08/20/2020  INDICATIONS:    [name] is a 84 y.o. male with combined peripheral vascular disease and chronic venous insufficiency.  He had developed a wound on his right leg which was slow to heal.  In addition when he elevated his leg for his venous insufficiency he developed rest pain.  Of note he has an AKA on the left.  His duplex scan suggested a stenosis in the proximal common femoral artery.  He was set up for arteriography and possible intervention.  PROCEDURE:    1.  Conscious sedation 2.  Ultrasound-guided access to both common femoral arteries 3.  Aortogram with bilateral iliac arteriogram and bilateral lower extremity runoff 4.  Angioplasty and stenting of bilateral common iliac arteries using a kissing balloon technique (right-39 mm x 8 mm VBX covered stent, left-29 mm x 8 mm VBX stent)  SURGEON: Judeth Cornfield. Scot Dock, MD, FACS  ANESTHESIA: Local with sedation  EBL: Minimal  TECHNIQUE: The patient was brought to the peripheral vascular lab and was sedated. The period of conscious sedation was 66 minutes.  During that time period, I was present face-to-face 100% of the time.  The patient was administered 1 mg of Versed and 50 mcg of fentanyl. The patient's heart rate, blood pressure, and oxygen saturation were monitored by the nurse continuously during the procedure.  Both groins were prepped and draped in the usual sterile fashion.  Under ultrasound guidance, after the skin was anesthetized, I cannulated the left common femoral artery with a micropuncture needle and a micropuncture sheath was introduced over a wire.  This was exchanged for a 5 [place] sheath over a Bentson wire.  By ultrasound the femoral artery was patent. A real-time image was obtained and sent to the server.  A pigtail catheter was positioned at the L1 vertebral body and flush aortogram obtained.  The catheter was in  position above the aortic bifurcation and oblique iliac projections were obtained.  There was extensive calcific plaque in the right common iliac artery and also in the left common iliac artery.  I felt that to safely address the disease on the right side I would have to do a kissing balloon technique.  For this reason I elected to cannulate the right side.  On the right side under ultrasound guidance, after the skin was anesthetized, I cannulated the right common femoral artery with a micropuncture needle and a micropuncture sheath was introduced over a wire.  This was exchanged for a bright tip 7 French sheath and advanced to below the plaque.  Of note ultrasound of the femoral artery on the right showed no significant plaque year.  A real-time image was sent to the server.  Next I exchanged the 5 French sheath on the left for a 7 [place] Brite tip sheath.  The patient was heparinized.  ACT was monitored throughout the procedure.  ACT was 252.  I then advanced a 7 French sheath on the right through the plaque.  I then advanced the 7 French sheath on the left through the plaque.  Both times using the dilator.  The dilator was then removed on both sides.  The 8 x 39 covered stent was placed up the right side and the sheath retracted.  The 8 x 29 covered stent was placed up the left side and the sheath was retracted.  This was positioned right at the  bifurcation and both stents were deployed to nominal pressure.  Completion film showed no residual stenosis.  Next the wires were removed on both sides.  A retrograde right femoral arteriogram was obtained with right lower extremity runoff.  Patient was then transferred to the holding area for removal of the sheaths.  FINDINGS:   1.  There are single renal arteries bilaterally with no significant renal artery stenosis identified. 2.  The infrarenal aorta is widely patent.  There is some eccentric plaque in the central portion of the aorta which does not produce  a significant stenosis. 3.  On the right side there is extensive plaque throughout the entire common iliac artery producing approximately 80% stenosis.  On the left side there is a more focal calcific plaque producing an 80% stenosis.  I elected to address these with kissing balloon angioplasty which is described above.  There was no residual stenosis at the completion. 4.  The right lower extremity runoff shows patent common femoral, deep femoral, superficial femoral, popliteal, tibial peroneal trunk, posterior tibial, and peroneal arteries.  The anterior tibial artery is occluded on the right.  PRE: 80% bilat. POST: 0% bilat. STENT: VBX bilat  TASC Classification  Largest Sheath Size: 7 Fr bilat  Target vessel: CIA bilat  % Stenosis: Pre 80%. Post 0% bilat.  Lesion length: 35 mm on the right, 25 mm on the left  Calcification: yes  Most impactful devices used (Up to 3): VBX  Outflow: Disease present distal to the lesion treated.    Deitra Mayo, MD, FACS [facility]  DATE OF DICTATION:   08/20/2020

## 2020-08-20 NOTE — Discharge Instructions (Signed)
Femoral Site Care This sheet gives you information about how to care for yourself after your procedure. Your health care provider may also give you more specific instructions. If you have problems or questions, contact your health care provider. What can I expect after the procedure? After the procedure, it is common to have:  Bruising that usually fades within 1-2 weeks.  Tenderness at the site. Follow these instructions at home: Wound care  Follow instructions from your health care provider about how to take care of your insertion site. Make sure you: ? Wash your hands with soap and water before you change your bandage (dressing). If soap and water are not available, use hand sanitizer. ? Change your dressing as told by your health care provider. ? Leave stitches (sutures), skin glue, or adhesive strips in place. These skin closures may need to stay in place for 2 weeks or longer. If adhesive strip edges start to loosen and curl up, you may trim the loose edges. Do not remove adhesive strips completely unless your health care provider tells you to do that.  Do not take baths, swim, or use a hot tub until your health care provider approves.  You may shower 24-48 hours after the procedure or as told by your health care provider. ? Gently wash the site with plain soap and water. ? Pat the area dry with a clean towel. ? Do not rub the site. This may cause bleeding.  Do not apply powder or lotion to the site. Keep the site clean and dry.  Check your femoral site every day for signs of infection. Check for: ? Redness, swelling, or pain. ? Fluid or blood. ? Warmth. ? Pus or a bad smell. Activity  For the first 2-3 days after your procedure, or as long as directed: ? Avoid climbing stairs as much as possible. ? Do not squat.  Do not lift anything that is heavier than 10 lb (4.5 kg), or the limit that you are told, until your health care provider says that it is safe.  Rest as  directed. ? Avoid sitting for a long time without moving. Get up to take short walks every 1-2 hours.  Do not drive for 24 hours if you were given a medicine to help you relax (sedative). General instructions  Take over-the-counter and prescription medicines only as told by your health care provider.  Keep all follow-up visits as told by your health care provider. This is important. Contact a health care provider if you have:  A fever or chills.  You have redness, swelling, or pain around your insertion site. Get help right away if:  The catheter insertion area swells very fast.  You pass out.  You suddenly start to sweat or your skin gets clammy.  The catheter insertion area is bleeding, and the bleeding does not stop when you hold steady pressure on the area.  The area near or just beyond the catheter insertion site becomes pale, cool, tingly, or numb. These symptoms may represent a serious problem that is an emergency. Do not wait to see if the symptoms will go away. Get medical help right away. Call your local emergency services (911 in the [place] Do not drive yourself to the hospital. Summary  After the procedure, it is common to have bruising that usually fades within 1-2 weeks.  Check your femoral site every day for signs of infection.  Do not lift anything that is heavier than 10 lb (4.5 kg), or the   limit that you are told, until your health care provider says that it is safe. This information is not intended to replace advice given to you by your health care provider. Make sure you discuss any questions you have with your health care provider. Document Revised: 10/22/2017 Document Reviewed: 10/22/2017 Elsevier Patient Education  2020 [employer]  

## 2020-08-20 NOTE — Progress Notes (Signed)
Called Dr Scot Dock and per Dr Scot Dock client to resume eliquis tomorrow; client and his wife notified and voiced understanding

## 2020-08-20 NOTE — Progress Notes (Signed)
Removal of Right femoral sheath and Left femoral sheath. Manula compression applied to bilateral sites and held pressure for 25 minutes. Pulses palpable. Groin stable. Right groin stable with no bruising or hematoma present. Sterile 4x4 applied to insertion site and secured with tegaderm. Left groin formed a small hematoma during pressure, hematoma was reduced. Left groin is stable with no hematoma however there is some bruising around insertion site. Distal pulses present. BP: 143 / 57, Hr: 70, SP02: 99.

## 2020-08-23 ENCOUNTER — Encounter (HOSPITAL_COMMUNITY): Payer: Self-pay | Admitting: Vascular Surgery

## 2020-09-13 ENCOUNTER — Other Ambulatory Visit: Payer: Self-pay | Admitting: *Deleted

## 2020-09-13 DIAGNOSIS — I739 Peripheral vascular disease, unspecified: Secondary | ICD-10-CM

## 2020-09-21 ENCOUNTER — Encounter (HOSPITAL_COMMUNITY): Payer: Medicare Other

## 2020-09-22 ENCOUNTER — Ambulatory Visit (INDEPENDENT_AMBULATORY_CARE_PROVIDER_SITE_OTHER): Payer: Medicare Other | Admitting: Vascular Surgery

## 2020-09-22 ENCOUNTER — Ambulatory Visit (HOSPITAL_COMMUNITY)
Admission: RE | Admit: 2020-09-22 | Discharge: 2020-09-22 | Disposition: A | Discharge status: Home / Self Care | Payer: Medicare Other | Source: Ambulatory Visit | Attending: Vascular Surgery | Admitting: Vascular Surgery

## 2020-09-22 ENCOUNTER — Encounter: Payer: Self-pay | Admitting: Vascular Surgery

## 2020-09-22 ENCOUNTER — Other Ambulatory Visit: Payer: Self-pay

## 2020-09-22 VITALS — BP 130/63 | HR 54 | Temp 98.3°F | Resp 20 | Ht 68.0 in | Wt 171.0 lb

## 2020-09-22 DIAGNOSIS — I872 Venous insufficiency (chronic) (peripheral): Secondary | ICD-10-CM

## 2020-09-22 DIAGNOSIS — I70221 Atherosclerosis of native arteries of extremities with rest pain, right leg: Secondary | ICD-10-CM

## 2020-09-22 DIAGNOSIS — I739 Peripheral vascular disease, unspecified: Secondary | ICD-10-CM | POA: Diagnosis not present

## 2020-09-22 NOTE — Progress Notes (Signed)
REASON FOR VISIT:   Follow-up of combined peripheral vascular disease and chronic venous insufficiency.  MEDICAL ISSUES:   COMBINED PERIPHERAL VASCULAR DISEASE AND CHRONIC VENOUS INSUFFICIENCY: The patient is doing well status post kissing balloon angioplasty of bilateral common iliac artery stenoses.  He has two-vessel runoff on the right via the posterior tibial and peroneal arteries with brisk Doppler signals.  The wound on his right calf has healed.  Now that we have addressed the arterial circulation I explained that he can continue with leg elevation and compression for his venous disease.  I have ordered a duplex of his iliac stents in 6 months and ABIs at that time.  He knows to call sooner if he has problems.   HPI:   [name] is a pleasant 84 y.o. male who had presented with combined peripheral vascular disease and chronic venous insufficiency. He had developed a wound on his right leg which was slow to heal. In addition when he elevated his leg he developed rest pain. He has an AKA on the left. His duplex scan suggested a stenosis in the proximal common femoral artery. He was set up for arteriography and possible intervention. On 08/20/2020 he underwent arteriography and was found bilateral common iliac artery stenoses. He underwent angioplasty and stenting of bilateral common iliac arteries using a kissing balloon technique with 8 mm VBX stents. He comes in for a 1 month follow-up visit.  He states that his right leg feels much better and the wound on the posterior aspect of his leg has now healed.  He has no specific complaints.  Past Medical History:  Diagnosis Date  . Arteriosclerotic cardiovascular disease (ASCVD)    CABG in 1990s; 2002 total obstruction of LAD, CX and RCA with patent grafts and nl EF; Stress nuc. 2008 - mild LV dilation; normal EF; questionable small anteroapical scar; no ischemia  . Arthritis    "left knee" (11/08/2015)  . Atrial fibrillation ([place])     . Cellulitis 11/08/2015   "both feet"  . Chronic anticoagulation   . Dental crowns present   . Diabetic peripheral neuropathy (HCC)    bilateral lower leg, hands  . History of blood transfusion    "related to my leg surgery?"  . Hypertension    states under control with meds., has been on med. x "long time"  . Iron deficiency anemia   . Jaw cancer ([place]) 1990s   "squamous cell"  . Myocardial infarction ([place]) 1990s   "after my jaw OR"  . Pedal edema    "not a lot", per pt.  . Peripheral vascular disease ([place])   . Presence of retained hardware 07/2015   failed hardware jaw  . Runny nose 07/27/2015   clear drainage, per pt.  . Stroke ([place])   . Type II diabetes mellitus ([place])    "diet controlled" (11/08/2015)    Family History  Problem Relation Age of Onset  . Heart disease Mother     SOCIAL HISTORY: Social History   Tobacco Use  . Smoking status: Never Smoker  . Smokeless tobacco: Never Used  Substance Use Topics  . Alcohol use: No    Alcohol/week: 0.0 standard drinks    Allergies  Allergen Reactions  . Lipitor [Atorvastatin] Other (See Comments)    SEVERE HEADACHE  . Simvastatin Other (See Comments)    MUSCLE ACHES  . Penicillins Rash    Has patient had a PCN reaction causing immediate rash, facial/tongue/throat swelling, SOB or lightheadedness  with hypotension: Yes Has patient had a PCN reaction causing severe rash involving mucus membranes or skin necrosis: No Has patient had a PCN reaction that required hospitalization No Has patient had a PCN reaction occurring within the last 10 years: No If all of the above answers are "NO", then may proceed with Cephalosporin use.   . Sulfa Antibiotics Rash    Tolerates silver sulfadiazine cream at home    Current Outpatient Medications  Medication Sig Dispense Refill  . amLODipine (NORVASC) 10 MG tablet TAKE 1 TABLET DAILY (Patient taking differently: Take 10 mg by mouth daily. ) 90 tablet 3  .  carboxymethylcellul-glycerin (OPTIVE) 0.5-0.9 % ophthalmic solution Place 1 drop into both eyes 3 (three) times daily as needed for dry eyes.    . cetirizine (ZYRTEC) 10 MG tablet Take 10 mg by mouth daily.    Marland Kitchen ELIQUIS 5 MG TABS tablet TAKE 1 TABLET TWICE A DAY (Patient taking differently: Take 5 mg by mouth in the morning and at bedtime. ) 180 tablet 0  . feeding supplement (BOOST / RESOURCE BREEZE) LIQD Take 1 Container by mouth 2 (two) times daily. (Patient taking differently: Take 237 mLs by mouth 2 (two) times daily. )    . losartan-hydrochlorothiazide (HYZAAR) 100-12.5 MG tablet Take 1 tablet by mouth daily.    Marland Kitchen lovastatin (MEVACOR) 10 MG tablet Take 10 mg by mouth daily.     . nebivolol (BYSTOLIC) 10 MG tablet Take 10 mg by mouth daily.    Marland Kitchen nystatin cream (MYCOSTATIN) Apply 1 application topically 3 (three) times daily as needed (skin irritation/rash.).     Marland Kitchen Omega-3 Fatty Acids (FISH OIL) 1200 MG CAPS Take 1,200 mg by mouth daily.    . Pediatric Multivitamins-Iron (FLINTSTONES PLUS IRON PO) Take 1 tablet by mouth daily.    . Probiotic Product (PHILLIPS COLON HEALTH PO) Take 1 capsule by mouth daily.     . silver sulfADIAZINE (SILVADENE) 1 % cream Apply 1 application topically daily. (Patient taking differently: Apply 1 application topically daily as needed (skin irritation.). )     No current facility-administered medications for this visit.    REVIEW OF SYSTEMS:  [X]  denotes positive finding, [ ]  denotes negative finding Cardiac  Comments:  Chest pain or chest pressure:    Shortness of breath upon exertion:    Short of breath when lying flat:    Irregular heart rhythm:        Vascular    Pain in calf, thigh, or hip brought on by ambulation:    Pain in feet at night that wakes you up from your sleep:     Blood clot in your veins:    Leg swelling:         Pulmonary    Oxygen at home:    Productive cough:     Wheezing:         Neurologic    Sudden weakness in arms or  legs:     Sudden numbness in arms or legs:     Sudden onset of difficulty speaking or slurred speech:    Temporary loss of vision in one eye:     Problems with dizziness:         Gastrointestinal    Blood in stool:     Vomited blood:         Genitourinary    Burning when urinating:     Blood in urine:        Psychiatric  Major depression:         Hematologic    Bleeding problems:    Problems with blood clotting too easily:        Skin    Rashes or ulcers:        Constitutional    Fever or chills:     PHYSICAL EXAM:   Vitals:   09/22/20 1113  BP: 130/63  Pulse: (!) 54  Resp: 20  Temp: 98.3 F (36.8 C)  SpO2: 96%  Weight: 171 lb (77.6 kg)  Height: 5' 8"  (1.727 m)    GENERAL: The patient is a well-nourished male, in no acute distress. The vital signs are documented above. CARDIAC: There is a regular rate and rhythm.  VASCULAR: He has palpable femoral pulses and a brisk posterior tibial and peroneal signal with a Doppler on the right. He has hyperpigmentation bilaterally consistent with chronic venous insufficiency. PULMONARY: There is good air exchange bilaterally without wheezing or rales. ABDOMEN: Soft and non-tender with normal pitched bowel sounds.  MUSCULOSKELETAL: He has a left AKA NEUROLOGIC: No focal weakness or paresthesias are detected. SKIN: The wound on the posterior aspect of his right calf has healed now PSYCHIATRIC: The patient has a normal affect.  DATA:    No new data  Deitra Mayo [facility] [phone]

## 2020-11-01 ENCOUNTER — Other Ambulatory Visit: Payer: Self-pay | Admitting: Family Medicine

## 2020-11-08 ENCOUNTER — Telehealth: Payer: Self-pay | Admitting: Family Medicine

## 2020-11-08 MED ORDER — APIXABAN 5 MG PO TABS
5.0000 mg | ORAL_TABLET | Freq: Two times a day (BID) | ORAL | 3 refills | Status: DC
Start: 2020-11-08 — End: 2020-11-24

## 2020-11-08 MED ORDER — APIXABAN 5 MG PO TABS
5.0000 mg | ORAL_TABLET | Freq: Two times a day (BID) | ORAL | 0 refills | Status: DC
Start: 2020-11-08 — End: 2020-11-08

## 2020-11-08 NOTE — Telephone Encounter (Signed)
Medication sent to pharmacy  

## 2020-11-08 NOTE — Telephone Encounter (Signed)
New message     *STAT* If patient is at the pharmacy, call can be transferred to refill team.   1. Which medications need to be refilled? (please list name of each medication and dose if known)  eliquis  2. Which pharmacy/location (including street and city if local pharmacy) is medication to be sent to? [facility] [place]  3. Do they need a 30 day or 90 day supply? [place]

## 2020-11-08 NOTE — Telephone Encounter (Signed)
Prescription was called into wrong place     *STAT* If patient is at the pharmacy, call can be transferred to refill team.   1. Which medications need to be refilled? (please list name of each medication and dose if known)  eliquis  2. Which pharmacy/location (including street and city if local pharmacy) is medication to be sent to? [facility] [place]  3. Do they need a 30 day or 90 day supply?  [place]

## 2020-11-08 NOTE — Telephone Encounter (Signed)
Done

## 2020-11-24 ENCOUNTER — Other Ambulatory Visit: Payer: Self-pay

## 2020-11-24 ENCOUNTER — Ambulatory Visit (INDEPENDENT_AMBULATORY_CARE_PROVIDER_SITE_OTHER): Payer: Medicare Other | Admitting: Physician Assistant

## 2020-11-24 ENCOUNTER — Encounter: Payer: Self-pay | Admitting: Physician Assistant

## 2020-11-24 VITALS — BP 130/60 | HR 60 | Ht 69.0 in | Wt 168.0 lb

## 2020-11-24 DIAGNOSIS — I10 Essential (primary) hypertension: Secondary | ICD-10-CM

## 2020-11-24 DIAGNOSIS — E1149 Type 2 diabetes mellitus with other diabetic neurological complication: Secondary | ICD-10-CM

## 2020-11-24 DIAGNOSIS — I739 Peripheral vascular disease, unspecified: Secondary | ICD-10-CM | POA: Diagnosis not present

## 2020-11-24 DIAGNOSIS — I4821 Permanent atrial fibrillation: Secondary | ICD-10-CM | POA: Diagnosis not present

## 2020-11-24 DIAGNOSIS — E785 Hyperlipidemia, unspecified: Secondary | ICD-10-CM

## 2020-11-24 DIAGNOSIS — I2510 Atherosclerotic heart disease of native coronary artery without angina pectoris: Secondary | ICD-10-CM

## 2020-11-24 DIAGNOSIS — Z89612 Acquired absence of left leg above knee: Secondary | ICD-10-CM

## 2020-11-24 MED ORDER — APIXABAN 5 MG PO TABS
5.0000 mg | ORAL_TABLET | Freq: Two times a day (BID) | ORAL | 3 refills | Status: DC
Start: 2020-11-24 — End: 2021-11-21

## 2020-11-24 NOTE — Progress Notes (Signed)
Cardiology Office Note:    Date:  11/24/2020   ID:  [name], DOB 1935-12-29, MRN [id number]  PCP:  Lemmie Evens, MD  [place] Cardiologist:  Will need to estab with Dr. Domenic Polite or Dr. Driscilla Moats [facility] Electrophysiologist:  None   Referring MD: Lemmie Evens, MD   Chief Complaint:  Follow-up (CAD, atrial fibrillation)    Patient Profile:    [name] is a 85 y.o. male with:   Coronary artery disease   S/p CABG 1990s  Cath 03/2001: Patent bypass grafts  Myoview 2008: No ischemia  Permanent atrial fibrillation  Peripheral arterial disease   S/p L AKA  S/p stent to bilat CIA 07/2020 (Dr. Scot Dock)  S/p CVA 12/2018 tx with endovascular revascularization of the left MCA  History of GI bleed  Anemia  Diabetes mellitus  Hypertension  Oral cancer  Hyperlipidemia  Prior CV studies: Echocardiogram 01/05/2019 EF 55, mild AI, severe LAE, moderate RAE, RVSP 36.7 (mild pulmonary pretension)  Carotid US 08/05/2012 No hemodynamically significant stenosis bilaterally  Cardiac catheterization 04/16/2001 LM distal 30 LAD 100 LCx 100 RCA 100 LIMA-LAD patent SVG-RI/OM patent SVG-RCA patent; distal RCA diffusely diseased>> med Rx  History of Present Illness:    Mr. Thunder was last seen in 10/2019 via telemedicine.  He returns for follow-up.  He is here with his wife.  Since last seen, he has done well.  He did have angioplasty and stenting to bilateral common iliac arteries in October 2021 with Dr. Scot Dock.  He has not had chest discomfort, significant shortness of breath, orthopnea, syncope.  He does have some edema in his right leg.  He is mainly in a wheelchair.  He does not use his prosthetic limb.      Past Medical History:  Diagnosis Date  . Arteriosclerotic cardiovascular disease (ASCVD)    CABG in 1990s; 2002 total obstruction of LAD, CX and RCA with patent grafts and nl EF; Stress nuc. 2008 - mild LV dilation; normal EF; questionable  small anteroapical scar; no ischemia  . Arthritis    "left knee" (11/08/2015)  . Atrial fibrillation ([place])   . Cellulitis 11/08/2015   "both feet"  . Chronic anticoagulation   . Dental crowns present   . Diabetic peripheral neuropathy (HCC)    bilateral lower leg, hands  . History of blood transfusion    "related to my leg surgery?"  . Hypertension    states under control with meds., has been on med. x "long time"  . Iron deficiency anemia   . Jaw cancer ([place]) 1990s   "squamous cell"  . Myocardial infarction ([place]) 1990s   "after my jaw OR"  . Pedal edema    "not a lot", per pt.  . Peripheral vascular disease ([place])   . Presence of retained hardware 07/2015   failed hardware jaw  . Runny nose 07/27/2015   clear drainage, per pt.  . Stroke ([place])   . Type II diabetes mellitus ([place])    "diet controlled" (11/08/2015)    Current Medications: Current Meds  Medication Sig  . amLODipine (NORVASC) 10 MG tablet TAKE 1 TABLET DAILY (Patient taking differently: Take 10 mg by mouth daily.)  . carboxymethylcellul-glycerin (OPTIVE) 0.5-0.9 % ophthalmic solution Place 1 drop into both eyes 3 (three) times daily as needed for dry eyes.  . cetirizine (ZYRTEC) 10 MG tablet Take 10 mg by mouth daily.  . feeding supplement (BOOST / RESOURCE BREEZE) LIQD Take 1 Container by mouth 2 (  two) times daily. (Patient taking differently: Take 237 mLs by mouth 2 (two) times daily.)  . halobetasol (ULTRAVATE) 0.05 % cream Apply topically.  Marland Kitchen losartan-hydrochlorothiazide (HYZAAR) 100-12.5 MG tablet Take 1 tablet by mouth daily.  Marland Kitchen lovastatin (MEVACOR) 10 MG tablet Take 10 mg by mouth daily.   . nebivolol (BYSTOLIC) 10 MG tablet Take 10 mg by mouth daily.  Marland Kitchen nystatin cream (MYCOSTATIN) Apply 1 application topically 3 (three) times daily as needed (skin irritation/rash.).   Marland Kitchen Omega-3 Fatty Acids (FISH OIL) 1200 MG CAPS Take 1,200 mg by mouth daily.  . Pediatric Multivitamins-Iron (FLINTSTONES PLUS IRON PO) Take 1  tablet by mouth daily.  . Probiotic Product (PHILLIPS COLON HEALTH PO) Take 1 capsule by mouth daily.   . silver sulfADIAZINE (SILVADENE) 1 % cream Apply 1 application topically daily. (Patient taking differently: Apply 1 application topically daily as needed (skin irritation.).)  . [DISCONTINUED] apixaban (ELIQUIS) 5 MG TABS tablet Take 1 tablet (5 mg total) by mouth 2 (two) times daily.     Allergies:   Lipitor [atorvastatin], Simvastatin, Penicillins, and Sulfa antibiotics   Social History   Tobacco Use  . Smoking status: Never Smoker  . Smokeless tobacco: Never Used  Vaping Use  . Vaping Use: Never used  Substance Use Topics  . Alcohol use: No    Alcohol/week: 0.0 standard drinks  . Drug use: No     Family Hx: The patient's family history includes Heart disease in his mother.  ROS   EKGs/Labs/Other Test Reviewed:    EKG:  EKG is not ordered today.  The ekg ordered today demonstrates n/a Electrocardiogram performed 08/20/2020 was personally reviewed and demonstrated atrial fibrillation with heart rate 71, left axis deviation, septal Q waves, aberrancy versus PVCs, QTC 467, similar to prior tracings  Recent Labs: 08/20/2020: BUN 27; Creatinine, Ser 1.30; Hemoglobin 11.2; Potassium 4.0; Sodium 142   Recent Lipid Panel Lab Results  Component Value Date/Time   CHOL 90 01/05/2019 06:11 AM   TRIG 116 01/07/2019 08:22 PM   HDL 30 (L) 01/05/2019 06:11 AM   CHOLHDL 3.0 01/05/2019 06:11 AM   LDLCALC 44 01/05/2019 06:11 AM      Risk Assessment/Calculations:    CHA2DS2-VASc Score = 7  This indicates a 11.2% annual risk of stroke. The patient's score is based upon: CHF History: No HTN History: Yes Diabetes History: Yes Stroke History: Yes Vascular Disease History: Yes Age Score: 2 Gender Score: 0     Physical Exam:    VS:  BP 130/60   Pulse 60   Ht 5' 9"  (1.753 m)   Wt 168 lb (76.2 kg)   SpO2 99%   BMI 24.81 kg/m     Wt Readings from Last 3 Encounters:   11/24/20 168 lb (76.2 kg)  09/22/20 171 lb (77.6 kg)  08/20/20 171 lb (77.6 kg)     Constitutional:      Appearance: Healthy appearance. Not in distress.  Pulmonary:     Effort: Pulmonary effort is normal.     Breath sounds: No wheezing. No rales.  Cardiovascular:     Normal rate. Irregularly irregular rhythm. Normal S1. Normal S2.     Murmurs: There is no murmur.  Edema:    Peripheral edema (trace R leg edema ) present. Abdominal:     Palpations: Abdomen is soft.  Musculoskeletal:     Cervical back: Neck supple.     Left Lower Extremity: Left leg is amputated above knee. Skin:    General:  Skin is warm and dry.  Neurological:     General: No focal deficit present.     Mental Status: Alert and oriented to person, place and time.     Cranial Nerves: Cranial nerves are intact.       ASSESSMENT & PLAN:    1. Coronary artery disease involving native coronary artery of native heart without angina pectoris History of CABG in the 1990s.  He had patent bypass grafts in 2002 by cardiac catheterization.  Myoview in 2008 was low risk.  He has remained fairly stable without anginal symptoms.  He is not on antiplatelet therapy as he remains on anticoagulation with Apixaban.  Continue lovastatin, nebivolol.  Follow-up 6 months.  2. PAD (peripheral artery disease) ([place]) 3. S/P AKA (above knee amputation), left ([place]) He has recently undergone stenting of bilateral common iliac arteries with Dr. Scot Dock.  He is not having any significant ischemic symptoms in his right leg.  4. Permanent atrial fibrillation (HCC) Rate is controlled.  He is tolerating anticoagulation.  Hemoglobin and creatinine were stable in 10/21.  He is on the appropriate dose of Apixaban based upon his weight and renal function.  Request most recent labs from primary care.  Follow-up in 6 months.  5. Essential hypertension The patient's blood pressure is controlled on his current regimen.  Continue current therapy.   6.  Hyperlipidemia LDL goal <70 Continue statin therapy.  This is managed by primary care.  Request most recent LFTs, lipids.  7. Type 2 diabetes mellitus with other neurologic complication, without long-term current use of insulin ([place]) Managed by primary care.  Diet controlled therapy.         Dispo:  Return in about 6 months (around 05/24/2021) for Routine Follow Up.   Medication Adjustments/Labs and Tests Ordered: Current medicines are reviewed at length with the patient today.  Concerns regarding medicines are outlined above.  Tests Ordered: No orders of the defined types were placed in this encounter.  Medication Changes: Meds ordered this encounter  Medications  . apixaban (ELIQUIS) 5 MG TABS tablet    Sig: Take 1 tablet (5 mg total) by mouth 2 (two) times daily.    Dispense:  180 tablet    Refill:  3    Signed, Richardson Dopp, PA-C  11/24/2020 3:37 PM    [facility] [address], [place], [place]  [address] Phone: [phone]; Fax: [phone]

## 2020-11-24 NOTE — Patient Instructions (Addendum)
Medication Instructions:  *If you need a refill on your cardiac medications before your next appointment, please call your pharmacy*  Lab Work: Please have your Primary Care Physician check a Basic Metabolic Panel (BMET), Complete Blood Count Panel (CBC), Hepatic Function Panel (LFT's) , and a Lipid Panel -- Have results faxed to [phone] If you have labs (blood work) drawn today and your tests are completely normal, you will receive your results only by: Marland Kitchen MyChart Message (if you have MyChart) OR . A paper copy in the mail If you have any lab test that is abnormal or we need to change your treatment, we will call you to review the results.  Follow-Up: At [facility], you and your health needs are our priority.  As part of our continuing mission to provide you with exceptional heart care, we have created designated Provider Care Teams.  These Care Teams include your primary Cardiologist (physician) and Advanced Practice Providers (APPs -  Physician Assistants and Nurse Practitioners) who all work together to provide you with the care you need, when you need it.  We recommend signing up for the patient portal called "MyChart".  Sign up information is provided on this After Visit Summary.  MyChart is used to connect with patients for Virtual Visits (Telemedicine).  Patients are able to view lab/test results, encounter notes, upcoming appointments, etc.  Non-urgent messages can be sent to your provider as well.   To learn more about what you can do with MyChart, go to [email or web address].    Your next appointment:    Your physician recommends that you schedule a follow-up appointment in: 6 MONTHS with Dr. Domenic Polite or an APP  The format for your next appointment:   In Person. You may see  Dr. Domenic Polite or one of the following Advanced Practice Providers on your designated Care Team:    [place], PA-C   Ermalinda Barrios, [place]

## 2021-03-01 ENCOUNTER — Other Ambulatory Visit: Payer: Self-pay

## 2021-03-01 DIAGNOSIS — I739 Peripheral vascular disease, unspecified: Secondary | ICD-10-CM

## 2021-03-23 ENCOUNTER — Ambulatory Visit (HOSPITAL_COMMUNITY)
Admission: RE | Admit: 2021-03-23 | Discharge: 2021-03-23 | Disposition: A | Discharge status: Home / Self Care | Payer: Medicare Other | Source: Ambulatory Visit | Attending: Vascular Surgery | Admitting: Vascular Surgery

## 2021-03-23 ENCOUNTER — Ambulatory Visit (INDEPENDENT_AMBULATORY_CARE_PROVIDER_SITE_OTHER)
Admission: RE | Admit: 2021-03-23 | Discharge: 2021-03-23 | Disposition: A | Discharge status: Home / Self Care | Payer: Medicare Other | Source: Ambulatory Visit | Attending: Vascular Surgery | Admitting: Vascular Surgery

## 2021-03-23 ENCOUNTER — Other Ambulatory Visit: Payer: Self-pay

## 2021-03-23 ENCOUNTER — Encounter: Payer: Self-pay | Admitting: Vascular Surgery

## 2021-03-23 ENCOUNTER — Ambulatory Visit (INDEPENDENT_AMBULATORY_CARE_PROVIDER_SITE_OTHER): Payer: Medicare Other | Admitting: Vascular Surgery

## 2021-03-23 VITALS — BP 136/65 | HR 65 | Temp 97.7°F | Resp 20 | Ht 69.0 in | Wt 168.0 lb

## 2021-03-23 DIAGNOSIS — I872 Venous insufficiency (chronic) (peripheral): Secondary | ICD-10-CM

## 2021-03-23 DIAGNOSIS — I739 Peripheral vascular disease, unspecified: Secondary | ICD-10-CM | POA: Diagnosis not present

## 2021-03-23 DIAGNOSIS — I2510 Atherosclerotic heart disease of native coronary artery without angina pectoris: Secondary | ICD-10-CM

## 2021-03-23 NOTE — Progress Notes (Signed)
REASON FOR VISIT:   Follow-up of combined peripheral vascular disease and chronic venous insufficiency  MEDICAL ISSUES:   PERIPHERAL VASCULAR DISEASE: The patient's iliac stents are widely patent.  He likely has some infrainguinal arterial occlusive disease on the right but has a good toe pressure and the wound on his right leg has healed.  He is not a smoker.  I encouraged him to stay as active as possible.  We will check ABIs and obtain a duplex of his iliac stents in 1 year.  He knows to call sooner if he has problems.  CHRONIC VENOUS INSUFFICIENCY: He has CEAP C4a venous disease.  He will continue to elevate his legs which he has been doing.  He can also use some mild compression.    HPI:   [name] is a pleasant 85 y.o. male who had presented with combined peripheral vascular disease and chronic venous insufficiency.  He had developed a wound on the right leg which was slow to heal.  Of note he has a left AKA.  On 08/20/2020 he underwent angioplasty and stenting of bilateral common iliac arteries using a kissing balloon technique.  8 mm VBX stents were placed bilaterally.  Since I saw him last the wound on the right leg healed.  He has a very small superficial wound on the posterior aspect of his calf where he bumped it recently.  He denies any rest pain in the right leg.  He has a left AKA.   Past Medical History:  Diagnosis Date  . Arteriosclerotic cardiovascular disease (ASCVD)    CABG in 1990s; 2002 total obstruction of LAD, CX and RCA with patent grafts and nl EF; Stress nuc. 2008 - mild LV dilation; normal EF; questionable small anteroapical scar; no ischemia  . Arthritis    "left knee" (11/08/2015)  . Atrial fibrillation ([place])   . Cellulitis 11/08/2015   "both feet"  . Chronic anticoagulation   . Dental crowns present   . Diabetic peripheral neuropathy (HCC)    bilateral lower leg, hands  . History of blood transfusion    "related to my leg surgery?"  .  Hypertension    states under control with meds., has been on med. x "long time"  . Iron deficiency anemia   . Jaw cancer ([place]) 1990s   "squamous cell"  . Myocardial infarction ([place]) 1990s   "after my jaw OR"  . Pedal edema    "not a lot", per pt.  . Peripheral vascular disease ([place])   . Presence of retained hardware 07/2015   failed hardware jaw  . Runny nose 07/27/2015   clear drainage, per pt.  . Stroke ([place])   . Type II diabetes mellitus ([place])    "diet controlled" (11/08/2015)    Family History  Problem Relation Age of Onset  . Heart disease Mother     SOCIAL HISTORY: Social History   Tobacco Use  . Smoking status: Never Smoker  . Smokeless tobacco: Never Used  Substance Use Topics  . Alcohol use: No    Alcohol/week: 0.0 standard drinks    Allergies  Allergen Reactions  . Lipitor [Atorvastatin] Other (See Comments)    SEVERE HEADACHE  . Simvastatin Other (See Comments)    MUSCLE ACHES  . Penicillins Rash    Has patient had a PCN reaction causing immediate rash, facial/tongue/throat swelling, SOB or lightheadedness with hypotension: Yes Has patient had a PCN reaction causing severe rash involving mucus membranes or skin necrosis: No  Has patient had a PCN reaction that required hospitalization No Has patient had a PCN reaction occurring within the last 10 years: No If all of the above answers are "NO", then may proceed with Cephalosporin use.   . Sulfa Antibiotics Rash    Tolerates silver sulfadiazine cream at home    Current Outpatient Medications  Medication Sig Dispense Refill  . amLODipine (NORVASC) 10 MG tablet TAKE 1 TABLET DAILY (Patient taking differently: Take 10 mg by mouth daily.) 90 tablet 3  . apixaban (ELIQUIS) 5 MG TABS tablet Take 1 tablet (5 mg total) by mouth 2 (two) times daily. 180 tablet 3  . carboxymethylcellul-glycerin (OPTIVE) 0.5-0.9 % ophthalmic solution Place 1 drop into both eyes 3 (three) times daily as needed for dry eyes.    .  cetirizine (ZYRTEC) 10 MG tablet Take 10 mg by mouth daily.    . feeding supplement (BOOST / RESOURCE BREEZE) LIQD Take 1 Container by mouth 2 (two) times daily. (Patient taking differently: Take 237 mLs by mouth 2 (two) times daily.)    . halobetasol (ULTRAVATE) 0.05 % cream Apply topically.    Marland Kitchen losartan-hydrochlorothiazide (HYZAAR) 100-12.5 MG tablet Take 1 tablet by mouth daily.    Marland Kitchen lovastatin (MEVACOR) 10 MG tablet Take 10 mg by mouth daily.     . nebivolol (BYSTOLIC) 10 MG tablet Take 10 mg by mouth daily.    Marland Kitchen nystatin cream (MYCOSTATIN) Apply 1 application topically 3 (three) times daily as needed (skin irritation/rash.).     Marland Kitchen Omega-3 Fatty Acids (FISH OIL) 1200 MG CAPS Take 1,200 mg by mouth daily.    . Pediatric Multivitamins-Iron (FLINTSTONES PLUS IRON PO) Take 1 tablet by mouth daily.    . Probiotic Product (PHILLIPS COLON HEALTH PO) Take 1 capsule by mouth daily.     . silver sulfADIAZINE (SILVADENE) 1 % cream Apply 1 application topically daily. (Patient taking differently: Apply 1 application topically daily as needed (skin irritation.).)     No current facility-administered medications for this visit.    REVIEW OF SYSTEMS:  [X]  denotes positive finding, [ ]  denotes negative finding Cardiac  Comments:  Chest pain or chest pressure:    Shortness of breath upon exertion:    Short of breath when lying flat:    Irregular heart rhythm:        Vascular    Pain in calf, thigh, or hip brought on by ambulation:    Pain in feet at night that wakes you up from your sleep:     Blood clot in your veins:    Leg swelling:         Pulmonary    Oxygen at home:    Productive cough:     Wheezing:         Neurologic    Sudden weakness in arms or legs:     Sudden numbness in arms or legs:     Sudden onset of difficulty speaking or slurred speech:    Temporary loss of vision in one eye:     Problems with dizziness:         Gastrointestinal    Blood in stool:     Vomited blood:          Genitourinary    Burning when urinating:     Blood in urine:        Psychiatric    Major depression:         Hematologic    Bleeding problems:  Problems with blood clotting too easily:        Skin    Rashes or ulcers:        Constitutional    Fever or chills:     PHYSICAL EXAM:   Vitals:   03/23/21 0941  BP: 136/65  Pulse: 65  Resp: 20  Temp: 97.7 F (36.5 C)  SpO2: 98%  Weight: 168 lb (76.2 kg)  Height: 5' 9"  (1.753 m)    GENERAL: The patient is a well-nourished male, in no acute distress. The vital signs are documented above. CARDIAC: There is a regular rate and rhythm.  VASCULAR: I do not detect carotid bruits. I am unable to assess his femoral pulses as he is in a wheelchair. I cannot palpate pedal pulses on the right. He has hyperpigmentation of the right leg. PULMONARY: There is good air exchange bilaterally without wheezing or rales. ABDOMEN: Soft and non-tender with normal pitched bowel sounds.  MUSCULOSKELETAL: He has a left AKA. NEUROLOGIC: No focal weakness or paresthesias are detected. SKIN: There are no ulcers or rashes noted. PSYCHIATRIC: The patient has a normal affect.  DATA:    ARTERIAL DOPPLER STUDY: I have independently interpreted his arterial Doppler study of the right lower extremity today.  This shows monophasic signals in the dorsalis pedis and posterior tibial positions.  The arteries are calcified and an ABI could not be calculated.  The toe pressure on the right is 76 mmHg.  AORTOILIAC DUPLEX: I have independently interpreted his aortoiliac duplex scan.  The right common iliac artery stent is widely patent without evidence of stenosis.  The left common iliac artery stent is widely patent without evidence of stenosis.  Deitra Mayo [facility] [phone]

## 2021-04-06 ENCOUNTER — Telehealth: Payer: Self-pay | Admitting: Cardiology

## 2021-04-06 MED ORDER — AMLODIPINE BESYLATE 10 MG PO TABS
10.0000 mg | ORAL_TABLET | Freq: Every day | ORAL | 0 refills | Status: DC
Start: 2021-04-06 — End: 2021-05-16

## 2021-04-06 MED ORDER — AMLODIPINE BESYLATE 10 MG PO TABS
10.0000 mg | ORAL_TABLET | Freq: Every day | ORAL | 1 refills | Status: DC
Start: 2021-04-06 — End: 2021-04-06

## 2021-04-06 NOTE — Telephone Encounter (Signed)
New message     *STAT* If patient is at the pharmacy, call can be transferred to refill team.   1. Which medications need to be refilled? (please list name of each medication and dose if known) amLODipine (NORVASC) 10 MG tablet  2. Which pharmacy/location (including street and city if local pharmacy) is medication to be sent to? [facility] [address]   3. Do they need a 30 day or 90 day supply? [place]

## 2021-04-06 NOTE — Telephone Encounter (Signed)
NEW MESSAGE      *STAT* If patient is at the pharmacy, call can be transferred to refill team.   1. Which medications need to be refilled? (please list name of each medication and dose if known) amLODipine (NORVASC) 10 MG tablet  2. Which pharmacy/location (including street and city if local pharmacy) is medication to be sent to? [facility]  3. Do they need a 30 day or 90 day supply? [place]

## 2021-04-06 NOTE — Telephone Encounter (Signed)
e-scribed per request

## 2021-05-16 ENCOUNTER — Other Ambulatory Visit: Payer: Self-pay | Admitting: Cardiology

## 2021-06-07 ENCOUNTER — Ambulatory Visit: Payer: Medicare Other | Admitting: Cardiology

## 2021-07-31 ENCOUNTER — Other Ambulatory Visit: Payer: Self-pay | Admitting: Cardiology

## 2021-08-15 ENCOUNTER — Ambulatory Visit: Payer: Medicare Other | Admitting: Cardiology

## 2021-08-22 ENCOUNTER — Ambulatory Visit (INDEPENDENT_AMBULATORY_CARE_PROVIDER_SITE_OTHER): Payer: Medicare Other | Admitting: Cardiology

## 2021-08-22 ENCOUNTER — Encounter: Payer: Self-pay | Admitting: Cardiology

## 2021-08-22 ENCOUNTER — Other Ambulatory Visit: Payer: Self-pay

## 2021-08-22 VITALS — BP 140/64 | HR 67 | Ht 69.0 in | Wt 153.8 lb

## 2021-08-22 DIAGNOSIS — I4821 Permanent atrial fibrillation: Secondary | ICD-10-CM

## 2021-08-22 DIAGNOSIS — I25810 Atherosclerosis of coronary artery bypass graft(s) without angina pectoris: Secondary | ICD-10-CM

## 2021-08-22 DIAGNOSIS — I639 Cerebral infarction, unspecified: Secondary | ICD-10-CM

## 2021-08-22 DIAGNOSIS — I739 Peripheral vascular disease, unspecified: Secondary | ICD-10-CM

## 2021-08-22 DIAGNOSIS — I10 Essential (primary) hypertension: Secondary | ICD-10-CM

## 2021-08-22 DIAGNOSIS — E7800 Pure hypercholesterolemia, unspecified: Secondary | ICD-10-CM

## 2021-08-22 NOTE — Assessment & Plan Note (Signed)
Prior stroke in 2020.  Was in the shower, could not speak.  Took him about a day or 2 to recover his voice.  Continue with secondary prevention.  Blood pressure control.  Statin.  Eliquis.

## 2021-08-22 NOTE — Assessment & Plan Note (Signed)
Had bypass surgery in the 1990s.  Doing very well.  Great attitude.  Played softball for many years.  Played into his 68s.  He actually played after his bypass surgery.  Continue with goal-directed medical therapy.  On Bystolic 10 mg a day, Eliquis 5 mg twice a day, 10 mg of lovastatin.

## 2021-08-22 NOTE — Patient Instructions (Signed)
Medication Instructions:  The current medical regimen is effective;  continue present plan and medications.  *If you need a refill on your cardiac medications before your next appointment, please call your pharmacy*  Follow-Up: At [facility], you and your health needs are our priority.  As part of our continuing mission to provide you with exceptional heart care, we have created designated Provider Care Teams.  These Care Teams include your primary Cardiologist (physician) and Advanced Practice Providers (APPs -  Physician Assistants and Nurse Practitioners) who all work together to provide you with the care you need, when you need it.  We recommend signing up for the patient portal called "MyChart".  Sign up information is provided on this After Visit Summary.  MyChart is used to connect with patients for Virtual Visits (Telemedicine).  Patients are able to view lab/test results, encounter notes, upcoming appointments, etc.  Non-urgent messages can be sent to your provider as well.   To learn more about what you can do with MyChart, go to [email or web address].    Your next appointment:   6 month(s)  The format for your next appointment:   In Person  Provider:   Bernerd Pho, PA-C or Ermalinda Barrios, PA-C  And 1 year with Dr Marlou Porch.   Thank you for choosing [facility]!!

## 2021-08-22 NOTE — Assessment & Plan Note (Signed)
Currently stable good control medications reviewed.

## 2021-08-22 NOTE — Assessment & Plan Note (Signed)
Rate controlled.  Doing well.  EKG reviewed.  Heart rate 67 bpm.  Continue with nebivolol.  Eliquis.  No bleeding.

## 2021-08-22 NOTE — Assessment & Plan Note (Signed)
Vascular surgery, Dr. Scot Dock has been monitoring him.  In 2020 had stenting of his bilateral common iliac arteries.  Doing well.  No claudication with his right leg.  Amputated left leg below-knee.

## 2021-08-22 NOTE — Progress Notes (Signed)
Cardiology Office Note:    Date:  08/22/2021   ID:  [name], DOB 1936-01-07, MRN [id number]  PCP:  [name] Evens, MD   [facility] Providers Cardiologist:  None     Referring MD: [name] Evens, MD    History of Present Illness:    RENALDO [name] is a 85 y.o. male here for the follow-up of coronary artery disease with CABG in the 1990s.  Diabetes, prior stroke.  He does not use his prosthetic limb.  Mainly in a wheelchair.  Mild edema in his right leg.  No fevers chills nausea vomiting syncope bleeding.   Coronary artery disease  S/p CABG 1990s Cath 03/2001: Patent bypass grafts Myoview 2008: No ischemia Permanent atrial fibrillation Peripheral arterial disease  S/p L AKA S/p stent to bilat CIA 07/2020 ([name]) S/p CVA 12/2018 tx with endovascular revascularization of the left MCA History of GI bleed Anemia Diabetes mellitus Hypertension Oral cancer Hyperlipidemia   Overall doing quite well.  No chest pain no shortness of breath.  Wheelchair.  Left BKA.  Here with his wife.  Enjoyed softball  Past Medical History:  Diagnosis Date   Arteriosclerotic cardiovascular disease (ASCVD)    CABG in 1990s; 2002 total obstruction of LAD, CX and RCA with patent grafts and nl EF; Stress nuc. 2008 - mild LV dilation; normal EF; questionable small anteroapical scar; no ischemia   Arthritis    "left knee" (11/08/2015)   Atrial fibrillation ([place])    Cellulitis 11/08/2015   "both feet"   Chronic anticoagulation    Dental crowns present    Diabetic peripheral neuropathy ([place])    bilateral lower leg, hands   History of blood transfusion    "related to my leg surgery?"   Hypertension    states under control with meds., has been on med. x "long time"   Iron deficiency anemia    Jaw cancer ([place]) 1990s   "squamous cell"   Myocardial infarction ([place]) 1990s   "after my jaw OR"   Pedal edema    "not a lot", per pt.   Peripheral vascular disease ([place])    Presence of  retained hardware 07/2015   failed hardware jaw   Runny nose 07/27/2015   clear drainage, per pt.   Stroke ([place])    Type II diabetes mellitus ([place])    "diet controlled" (11/08/2015)    Past Surgical History:  Procedure Laterality Date   ABDOMINAL AORTOGRAM W/LOWER EXTREMITY Bilateral 08/20/2020   Procedure: ABDOMINAL AORTOGRAM W/LOWER EXTREMITY;  Surgeon: Angelia Mould, MD;  Location: [place] [facility];  Service: Cardiovascular;  Laterality: Bilateral;   AMPUTATION Left 11/11/2015   Procedure: LEFT ABOVE KNEE AMPUTATION;  Surgeon: Angelia Mould, MD;  Location: [place];  Service: Vascular;  Laterality: Left;   CARDIAC CATHETERIZATION  1990s; 04/16/2001   CATARACT EXTRACTION W/ INTRAOCULAR LENS  IMPLANT, BILATERAL Bilateral 09/2015   COLONOSCOPY N/A 11/27/2014   Procedure: COLONOSCOPY;  Surgeon: Rogene Houston, MD;  [facility];  Service: Endoscopy;  Laterality: N/A;  830   CORONARY ARTERY BYPASS GRAFT  1990's   "CABG X4"   ESOPHAGOGASTRODUODENOSCOPY N/A 02/14/2018   Procedure: ESOPHAGOGASTRODUODENOSCOPY (EGD);  Surgeon: Rogene Houston, MD;  [facility];  Service: Endoscopy;  Laterality: N/A;   ESOPHAGOGASTRODUODENOSCOPY (EGD) WITH PROPOFOL N/A 02/25/2018   Procedure: ESOPHAGOGASTRODUODENOSCOPY (EGD) WITH PROPOFOL;  Surgeon: Rogene Houston, MD;  [facility];  Service: Endoscopy;  Laterality: N/A;   FRACTURE SURGERY  GIVENS CAPSULE STUDY N/A 02/15/2018   Procedure: GIVENS CAPSULE STUDY;  Surgeon: Rogene Houston, MD;  [facility];  Service: Endoscopy;  Laterality: N/A;   GIVENS CAPSULE STUDY N/A 03/27/2018   Procedure: GIVENS CAPSULE STUDY;  Surgeon: Rogene Houston, MD;  [facility];  Service: Endoscopy;  Laterality: N/A;   I & D EXTREMITY Left 11/10/2015   Procedure: INCISION AND DRAINAGE LEFT FOOT, AMPUTATION OF LEFT THIRD TOE;  Surgeon: Conrad [place], MD;  Location: [place];  Service: Vascular;  Laterality: Left;    INCISION AND DRAINAGE ABSCESS Left 06/16/2005   wide exc. abscess 5th toe   IR ANGIOGRAM VISCERAL SELECTIVE  03/28/2018   IR ANGIOGRAM VISCERAL SELECTIVE  03/28/2018   IR CT HEAD LTD  01/04/2019   IR PERCUTANEOUS ART THROMBECTOMY/INFUSION INTRACRANIAL INC DIAG ANGIO  01/04/2019   IR US GUIDE VASC ACCESS LEFT  03/28/2018   MANDIBULAR HARDWARE REMOVAL Left 08/02/2015   Procedure:  HARDWARE REMOVAL TWO MANDIBULAR SCREWS;  Surgeon: Jannette Fogo, DDS;  Location: [place];  Service: Oral Surgery;  Laterality: Left;   ORIF TIBIA FRACTURE Left ~ [place] N/A 08/14/2016   Procedure: Abdominal Aortogram w/Lower Extremity;  Surgeon: Angelia Mould, MD;  Location: [place] [facility];  Service: Cardiovascular;  Laterality: N/A;   PERIPHERAL VASCULAR INTERVENTION Bilateral 08/20/2020   Procedure: PERIPHERAL VASCULAR INTERVENTION;  Surgeon: Angelia Mould, MD;  Location: [place] [facility];  Service: Cardiovascular;  Laterality: Bilateral;  iliac stents   RADIOLOGY WITH ANESTHESIA N/A 01/04/2019   Procedure: CODE STROKE;  Surgeon: Radiologist, Medication, MD;  Location: [place];  Service: Radiology;  Laterality: N/A;   SQUAMOUS CELL CARCINOMA EXCISION Left 1993   "took my jaw out; got cadavar in there now"   TONSILLECTOMY      Current Medications: Current Meds  Medication Sig   amLODipine (NORVASC) 10 MG tablet TAKE 1 TABLET DAILY   apixaban (ELIQUIS) 5 MG TABS tablet Take 1 tablet (5 mg total) by mouth 2 (two) times daily.   carboxymethylcellul-glycerin (OPTIVE) 0.5-0.9 % ophthalmic solution Place 1 drop into both eyes 3 (three) times daily as needed for dry eyes.   cetirizine (ZYRTEC) 10 MG tablet Take 10 mg by mouth daily.   feeding supplement (BOOST / RESOURCE BREEZE) LIQD Take 1 Container by mouth 2 (two) times daily. (Patient taking differently: Take 237 mLs by mouth 2 (two) times daily.)   halobetasol (ULTRAVATE) 0.05 % cream Apply topically.    losartan-hydrochlorothiazide (HYZAAR) 100-12.5 MG tablet Take 1 tablet by mouth daily.   lovastatin (MEVACOR) 10 MG tablet Take 10 mg by mouth daily.    nebivolol (BYSTOLIC) 10 MG tablet Take 10 mg by mouth daily.   nystatin cream (MYCOSTATIN) Apply 1 application topically 3 (three) times daily as needed (skin irritation/rash.).    Omega-3 Fatty Acids (FISH OIL) 1200 MG CAPS Take 1,200 mg by mouth daily.   Pediatric Multivitamins-Iron (FLINTSTONES PLUS IRON PO) Take 1 tablet by mouth daily.   Probiotic Product (PHILLIPS COLON HEALTH PO) Take 1 capsule by mouth daily.      Allergies:   Lipitor [atorvastatin], Simvastatin, Penicillins, and Sulfa antibiotics   Social History   Socioeconomic History   Marital status: Married    Spouse name: Not on file   Number of children: Not on file   Years of education: Not on file   Highest education level: Not on file  Occupational History  Not on file  Tobacco Use   Smoking status: Never   Smokeless tobacco: Never  Vaping Use   Vaping Use: Never used  Substance and Sexual Activity   Alcohol use: No    Alcohol/week: 0.0 standard drinks   Drug use: No   Sexual activity: Yes  Other Topics Concern   Not on file  Social History Narrative   Not on file   Social Determinants of Health   Financial Resource Strain: Not on file  Food Insecurity: Not on file  Transportation Needs: Not on file  Physical Activity: Not on file  Stress: Not on file  Social Connections: Not on file     Family History: The patient's family history includes Heart disease in his mother.  ROS:   Please see the history of present illness.     All other systems reviewed and are negative.  EKGs/Labs/Other Studies Reviewed:    The following studies were reviewed today: Echocardiogram 01/05/2019 EF 55, mild AI, severe LAE, moderate RAE, RVSP 36.7 (mild pulmonary pretension)   Carotid US 08/05/2012 No hemodynamically significant stenosis bilaterally    Cardiac catheterization 04/16/2001 LM distal 30 LAD 100 LCx 100 RCA 100 LIMA-LAD patent SVG-RI/OM patent SVG-RCA patent; distal RCA diffusely diseased>> med Rx  EKG:  EKG is  ordered today.  The ekg ordered today demonstrates atrial fibrillation 67 PVCs no other changes T wave noted in the inferior leads inversion  Recent Labs: No results found for requested labs within last 8760 hours.  Recent Lipid Panel    Component Value Date/Time   CHOL 90 01/05/2019 0611   TRIG 116 01/07/2019 2022   HDL 30 (L) 01/05/2019 0611   CHOLHDL 3.0 01/05/2019 0611   VLDL 16 01/05/2019 0611   LDLCALC 44 01/05/2019 0611     Risk Assessment/Calculations:    CHA2DS2-VASc Score = 7   This indicates a 11.2% annual risk of stroke. The patient's score is based upon: CHF History: 0 HTN History: 1 Diabetes History: 1 Stroke History: 2 Vascular Disease History: 1 Age Score: 2 Gender Score: 0          Physical Exam:    VS:  BP 140/64   Pulse 67   Ht 5' 9"  (1.753 m)   Wt 153 lb 12.8 oz (69.8 kg)   SpO2 97%   BMI 22.71 kg/m     Wt Readings from Last 3 Encounters:  08/22/21 153 lb 12.8 oz (69.8 kg)  03/23/21 168 lb (76.2 kg)  11/24/20 168 lb (76.2 kg)     GEN:  Well nourished, well developed in no acute distress HEENT: Normal NECK: No JVD; No carotid bruits LYMPHATICS: No lymphadenopathy CARDIAC: IRRR, no murmurs, rubs, gallops RESPIRATORY:  Clear to auscultation without rales, wheezing or rhonchi  ABDOMEN: Soft, non-tender, non-distended MUSCULOSKELETAL:  No edema; No deformity  SKIN: Warm and dry NEUROLOGIC:  Alert and oriented x 3 PSYCHIATRIC:  Normal affect   ASSESSMENT:    1. Essential hypertension   2. Coronary artery disease involving coronary bypass graft of native heart without angina pectoris   3. Cerebrovascular accident (CVA), unspecified mechanism ([place])   4. Peripheral vascular disease ([place])   5. Pure hypercholesterolemia   6. Permanent atrial fibrillation  (HCC)    PLAN:    In order of problems listed above:  Coronary artery disease involving coronary bypass graft of native heart without angina pectoris Had bypass surgery in the 1990s.  Doing very well.  Great attitude.  Played softball for many  years.  Played into his 27s.  He actually played after his bypass surgery.  Continue with goal-directed medical therapy.  On Bystolic 10 mg a day, Eliquis 5 mg twice a day, 10 mg of lovastatin.  Stroke [facility]) Prior stroke in 2020.  Was in the shower, could not speak.  Took him about a day or 2 to recover his voice.  Continue with secondary prevention.  Blood pressure control.  Statin.  Eliquis.  Peripheral vascular disease [facility]) Vascular surgery, [name] has been monitoring him.  In 2020 had stenting of his bilateral common iliac arteries.  Doing well.  No claudication with his right leg.  Amputated left leg below-knee.  Essential hypertension Currently stable good control medications reviewed.  Permanent atrial fibrillation (HCC) Rate controlled.  Doing well.  EKG reviewed.  Heart rate 67 bpm.  Continue with nebivolol.  Eliquis.  No bleeding.     54-monthfollow-up APP   Medication Adjustments/Labs and Tests Ordered: Current medicines are reviewed at length with the patient today.  Concerns regarding medicines are outlined above.  Orders Placed This Encounter  Procedures   EKG 12-Lead   No orders of the defined types were placed in this encounter.   Patient Instructions  Medication Instructions:  The current medical regimen is effective;  continue present plan and medications.  *If you need a refill on your cardiac medications before your next appointment, please call your pharmacy*  Follow-Up: [facility] you and your health needs are our priority.  As part of our continuing mission to provide you with exceptional heart care, we have created designated Provider Care Teams.  These Care Teams include your primary Cardiologist  (physician) and Advanced Practice Providers (APPs -  Physician Assistants and Nurse Practitioners) who all work together to provide you with the care you need, when you need it.  We recommend signing up for the patient portal called "MyChart".  Sign up information is provided on this After Visit Summary.  MyChart is used to connect with patients for Virtual Visits (Telemedicine).  Patients are able to view lab/test results, encounter notes, upcoming appointments, etc.  Non-urgent messages can be sent to your provider as well.   To learn more about what you can do with MyChart, go to [email or web address]    Your next appointment:   6 month(s)  The format for your next appointment:   In Person  Provider:   BBernerd Pho PA-C or MErmalinda Barrios PA-C  And 1 year with Dr SMarlou Porch   [facility]!     Signed, MCandee Furbish MD  08/22/2021 3:48 PM    [place]

## 2021-11-19 ENCOUNTER — Other Ambulatory Visit: Payer: Self-pay | Admitting: Physician Assistant

## 2021-11-21 NOTE — Telephone Encounter (Signed)
Prescription refill request for Eliquis received. Indication: Afib  Last office visit: 08/22/21 Marlou Porch)  Scr: 1.20 (09/27/21 via PCP)   Age: 86 Weight: 69.8kg  Appropriate dose and refill sent to requested pharmacy.

## 2021-11-21 NOTE — Telephone Encounter (Deleted)
Prescription refill request for Eliquis received. Indication: Afib  Last office visit: 08/22/21 Marlou Porch)  Scr: 1.3 (08/20/20)  Age: 86 Weight: 69.8kg  Labs overdue. Called PCP to determine of labs have been completed.

## 2021-11-27 ENCOUNTER — Ambulatory Visit (INDEPENDENT_AMBULATORY_CARE_PROVIDER_SITE_OTHER): Payer: Medicare Other

## 2021-11-27 ENCOUNTER — Ambulatory Visit
Admission: RE | Admit: 2021-11-27 | Discharge: 2021-11-27 | Disposition: A | Discharge status: Home / Self Care | Payer: Medicare Other | Source: Ambulatory Visit | Attending: Urgent Care | Admitting: Urgent Care

## 2021-11-27 ENCOUNTER — Other Ambulatory Visit: Payer: Self-pay

## 2021-11-27 VITALS — BP 126/64 | HR 53 | Temp 98.2°F | Resp 18

## 2021-11-27 DIAGNOSIS — I4891 Unspecified atrial fibrillation: Secondary | ICD-10-CM

## 2021-11-27 DIAGNOSIS — M7989 Other specified soft tissue disorders: Secondary | ICD-10-CM | POA: Diagnosis not present

## 2021-11-27 DIAGNOSIS — E119 Type 2 diabetes mellitus without complications: Secondary | ICD-10-CM | POA: Diagnosis not present

## 2021-11-27 DIAGNOSIS — M25531 Pain in right wrist: Secondary | ICD-10-CM | POA: Diagnosis not present

## 2021-11-27 DIAGNOSIS — M79641 Pain in right hand: Secondary | ICD-10-CM

## 2021-11-27 DIAGNOSIS — M19041 Primary osteoarthritis, right hand: Secondary | ICD-10-CM

## 2021-11-27 MED ORDER — ACETAMINOPHEN 325 MG PO TABS: 650.0000 mg | ORAL_TABLET | Freq: Four times a day (QID) | ORAL | 0 refills | Status: AC | PRN

## 2021-11-27 NOTE — ED Provider Notes (Signed)
[place]   MRN: [id number] DOB: 1936-06-02  Subjective:   [name] is a 86 y.o. male presenting for suffering a left hand and wrist injury 2 days ago.  Patient states that he was trying to get into his vehicle.  As he has an above-the-knee amputation of the left lower extremity, has a difficult time ambulating.  Unfortunately he lost control of his body and ended up having to grip the steering wheel very tightly to prevent himself from falling, this ended up causing him to rotate around and had to carry all his weight with just gripping the steering well.  He was able to get assistance thereafter.  He did have significant swelling and pain thereafter.  Today the symptoms are dramatically improved but he wanted to be evaluated.  No fall, head trauma, redness, open wounds, fever, nausea, vomiting, red streaking.  No history of gout per patient.  He does have a history of atrial fibrillation, heart disease, and MI, is a type II diabetic treated without insulin.  No history of CKD to the best of his knowledge.  No current facility-administered medications for this encounter.  Current Outpatient Medications:    amLODipine (NORVASC) 10 MG tablet, TAKE 1 TABLET DAILY, Disp: 90 tablet, Rfl: 1   carboxymethylcellul-glycerin (OPTIVE) 0.5-0.9 % ophthalmic solution, Place 1 drop into both eyes 3 (three) times daily as needed for dry eyes., Disp: , Rfl:    cetirizine (ZYRTEC) 10 MG tablet, Take 10 mg by mouth daily., Disp: , Rfl:    ELIQUIS 5 MG TABS tablet, TAKE 1 TABLET TWICE A DAY, Disp: 180 tablet, Rfl: 3   feeding supplement (BOOST / RESOURCE BREEZE) LIQD, Take 1 Container by mouth 2 (two) times daily. (Patient taking differently: Take 237 mLs by mouth 2 (two) times daily.), Disp: , Rfl:    halobetasol (ULTRAVATE) 0.05 % cream, Apply topically., Disp: , Rfl:    losartan-hydrochlorothiazide (HYZAAR) 100-12.5 MG tablet, Take 1 tablet by mouth daily., Disp: , Rfl:    lovastatin  (MEVACOR) 10 MG tablet, Take 10 mg by mouth daily. , Disp: , Rfl:    nebivolol (BYSTOLIC) 10 MG tablet, Take 10 mg by mouth daily., Disp: , Rfl:    nystatin cream (MYCOSTATIN), Apply 1 application topically 3 (three) times daily as needed (skin irritation/rash.). , Disp: , Rfl:    Omega-3 Fatty Acids (FISH OIL) 1200 MG CAPS, Take 1,200 mg by mouth daily., Disp: , Rfl:    Pediatric Multivitamins-Iron (FLINTSTONES PLUS IRON PO), Take 1 tablet by mouth daily., Disp: , Rfl:    Probiotic Product ([place]), Take 1 capsule by mouth daily. , Disp: , Rfl:    silver sulfADIAZINE (SILVADENE) 1 % cream, Apply 1 application topically daily., Disp: , Rfl:    Allergies  Allergen Reactions   Lipitor [Atorvastatin] Other (See Comments)    SEVERE HEADACHE   Simvastatin Other (See Comments)    MUSCLE ACHES   Penicillins Rash    Has patient had a PCN reaction causing immediate rash, facial/tongue/throat swelling, SOB or lightheadedness with hypotension: Yes Has patient had a PCN reaction causing severe rash involving mucus membranes or skin necrosis: No Has patient had a PCN reaction that required hospitalization No Has patient had a PCN reaction occurring within the last 10 years: No If all of the above answers are "NO", then may proceed with Cephalosporin use.    Sulfa Antibiotics Rash    Tolerates silver sulfadiazine cream at home  Past Medical History:  Diagnosis Date   Arteriosclerotic cardiovascular disease (ASCVD)    CABG in 1990s; 2002 total obstruction of LAD, CX and RCA with patent grafts and nl EF; Stress nuc. 2008 - mild LV dilation; normal EF; questionable small anteroapical scar; no ischemia   Arthritis    "left knee" (11/08/2015)   Atrial fibrillation ([place])    Cellulitis 11/08/2015   "both feet"   Chronic anticoagulation    Dental crowns present    Diabetic peripheral neuropathy ([place])    bilateral lower leg, hands   History of blood transfusion    "related to my leg  surgery?"   Hypertension    states under control with meds., has been on med. x "long time"   Iron deficiency anemia    Jaw cancer ([place]) 1990s   "squamous cell"   Myocardial infarction ([place]) 1990s   "after my jaw OR"   Pedal edema    "not a lot", per pt.   Peripheral vascular disease ([place])    Presence of retained hardware 07/2015   failed hardware jaw   Runny nose 07/27/2015   clear drainage, per pt.   Stroke ([place])    Type II diabetes mellitus ([place])    "diet controlled" (11/08/2015)     Past Surgical History:  Procedure Laterality Date   ABDOMINAL AORTOGRAM W/LOWER EXTREMITY Bilateral 08/20/2020   Procedure: ABDOMINAL AORTOGRAM W/LOWER EXTREMITY;  Surgeon: Angelia Mould, MD;  Location: [facility] [facility] [facility];  Service: Cardiovascular;  Laterality: Bilateral;   AMPUTATION Left 11/11/2015   Procedure: LEFT ABOVE KNEE AMPUTATION;  Surgeon: Angelia Mould, MD;  Location: [facility];  Service: Vascular;  Laterality: Left;   CARDIAC CATHETERIZATION  1990s; 04/16/2001   CATARACT EXTRACTION W/ INTRAOCULAR LENS  IMPLANT, BILATERAL Bilateral 09/2015   COLONOSCOPY N/A 11/27/2014   Procedure: COLONOSCOPY;  Surgeon: Rogene Houston, MD;  [facility];  Service: Endoscopy;  Laterality: N/A;  830   CORONARY ARTERY BYPASS GRAFT  1990's   "CABG X4"   ESOPHAGOGASTRODUODENOSCOPY N/A 02/14/2018   Procedure: ESOPHAGOGASTRODUODENOSCOPY (EGD);  Surgeon: Rogene Houston, MD;  [facility];  Service: Endoscopy;  Laterality: N/A;   ESOPHAGOGASTRODUODENOSCOPY (EGD) WITH PROPOFOL N/A 02/25/2018   Procedure: ESOPHAGOGASTRODUODENOSCOPY (EGD) WITH PROPOFOL;  Surgeon: Rogene Houston, MD;  [facility];  Service: Endoscopy;  Laterality: N/A;   FRACTURE SURGERY     GIVENS CAPSULE STUDY N/A 02/15/2018   Procedure: GIVENS CAPSULE STUDY;  Surgeon: Rogene Houston, MD;  [facility];  Service: Endoscopy;  Laterality: N/A;   GIVENS CAPSULE STUDY N/A 03/27/2018   Procedure:  GIVENS CAPSULE STUDY;  Surgeon: Rogene Houston, MD;  [facility];  Service: Endoscopy;  Laterality: N/A;   I & D EXTREMITY Left 11/10/2015   Procedure: INCISION AND DRAINAGE LEFT FOOT, AMPUTATION OF LEFT THIRD TOE;  Surgeon: Conrad [place] River, MD;  Location: [facility];  Service: Vascular;  Laterality: Left;   INCISION AND DRAINAGE ABSCESS Left 06/16/2005   wide exc. abscess 5th toe   IR ANGIOGRAM VISCERAL SELECTIVE  03/28/2018   IR ANGIOGRAM VISCERAL SELECTIVE  03/28/2018   IR CT HEAD LTD  01/04/2019   IR PERCUTANEOUS ART THROMBECTOMY/INFUSION INTRACRANIAL INC DIAG ANGIO  01/04/2019   IR US GUIDE VASC ACCESS LEFT  03/28/2018   MANDIBULAR HARDWARE REMOVAL Left 08/02/2015   Procedure:  HARDWARE REMOVAL TWO MANDIBULAR SCREWS;  Surgeon: Jannette Fogo, DDS;  Location: [facility];  Service: Oral  Surgery;  Laterality: Left;   ORIF TIBIA FRACTURE Left ~ [place] N/A 08/14/2016   Procedure: Abdominal Aortogram w/Lower Extremity;  Surgeon: Angelia Mould, MD;  Location: [facility];  Service: Cardiovascular;  Laterality: N/A;   PERIPHERAL VASCULAR INTERVENTION Bilateral 08/20/2020   Procedure: PERIPHERAL VASCULAR INTERVENTION;  Surgeon: Angelia Mould, MD;  Location: [facility];  Service: Cardiovascular;  Laterality: Bilateral;  iliac stents   RADIOLOGY WITH ANESTHESIA N/A 01/04/2019   Procedure: CODE STROKE;  Surgeon: Radiologist, Medication, MD;  Location: [facility];  Service: Radiology;  Laterality: N/A;   SQUAMOUS CELL CARCINOMA EXCISION Left 1993   "took my jaw out; got cadavar in there now"   TONSILLECTOMY      Family History  Problem Relation Age of Onset   Heart disease Mother     Social History   Tobacco Use   Smoking status: Never   Smokeless tobacco: Never  Vaping Use   Vaping Use: Never used  Substance Use Topics   Alcohol use: No    Alcohol/week: 0.0 standard drinks   Drug use: No    ROS   Objective:    Vitals: BP 126/64 (BP Location: Left Arm)    Pulse (!) 53    Temp 98.2 F (36.8 C) (Oral)    Resp 18    SpO2 98%   Physical Exam Constitutional:      General: He is not in acute distress.    Appearance: Normal appearance. He is well-developed and normal weight. He is not ill-appearing, toxic-appearing or diaphoretic.  HENT:     Head: Normocephalic and atraumatic.     Right Ear: External ear normal.     Left Ear: External ear normal.     Nose: Nose normal.     Mouth/Throat:     Pharynx: Oropharynx is clear.  Eyes:     General: No scleral icterus.       Right eye: No discharge.        Left eye: No discharge.     Extraocular Movements: Extraocular movements intact.  Cardiovascular:     Rate and Rhythm: Normal rate.  Pulmonary:     Effort: Pulmonary effort is normal.  Musculoskeletal:       Hands:     Cervical back: Normal range of motion.     Comments: Full range of motion at the level of the hand and wrist.  Neurological:     Mental Status: He is alert and oriented to person, place, and time.  Psychiatric:        Mood and Affect: Mood normal.        Behavior: Behavior normal.        Thought Content: Thought content normal.        Judgment: Judgment normal.    DG Hand Complete Right  Result Date: 11/27/2021 CLINICAL DATA:  Right hand pain and swelling EXAM: RIGHT HAND - COMPLETE 3+ VIEW COMPARISON:  None. FINDINGS: Generalized osteopenia. No acute fracture or dislocation. Moderate osteoarthritis of the first Bentley joint. Mild osteoarthritis of first IP joint. Mild osteoarthritis of the PIP and D IP joints. No aggressive osseous lesion. Normal alignment. Soft tissue swelling along the dorsal aspect of the hand. No radiopaque foreign body or soft tissue emphysema. Peripheral vascular atherosclerotic disease. IMPRESSION: 1. No acute osseous injury of the right hand. 2. Osteoarthritis of the right hand as described above. Electronically Signed   By: [place] Service.D.  On:  11/27/2021 14:19     Assessment and Plan :   PDMP not reviewed this encounter.  1. Right hand pain   2. Swelling of right hand   3. Right wrist pain   4. Type 2 diabetes mellitus treated without insulin ([place])   5. Atrial fibrillation, unspecified type ([place])   6. Arthritis of right hand    Use Tylenol for pain relief. Will hold off on steroids despite his arthritis as his pain is mild now. Counseled patient on potential for adverse effects with medications prescribed/recommended today, ER and return-to-clinic precautions discussed, patient verbalized understanding.    Jaynee Eagles, PA-C 11/27/21 1427

## 2021-11-27 NOTE — ED Triage Notes (Signed)
Patient states that he was trying to get into his [employer] on Friday, his "grip" and stance was unsteady, he grabbed the stearing wheel with his right hand and sat down on the running board until his wife was available.  Patient states that he did not fall, the swelling in his hand has gone down, some bruising around his right thumb.  No pain.

## 2022-03-28 ENCOUNTER — Other Ambulatory Visit: Payer: Self-pay | Admitting: *Deleted

## 2022-03-28 DIAGNOSIS — I739 Peripheral vascular disease, unspecified: Secondary | ICD-10-CM

## 2022-03-28 DIAGNOSIS — I70221 Atherosclerosis of native arteries of extremities with rest pain, right leg: Secondary | ICD-10-CM

## 2022-04-06 ENCOUNTER — Ambulatory Visit (HOSPITAL_COMMUNITY)
Admission: RE | Admit: 2022-04-06 | Discharge: 2022-04-06 | Disposition: A | Discharge status: Home / Self Care | Payer: Medicare Other | Source: Ambulatory Visit | Attending: Vascular Surgery | Admitting: Vascular Surgery

## 2022-04-06 ENCOUNTER — Ambulatory Visit (INDEPENDENT_AMBULATORY_CARE_PROVIDER_SITE_OTHER): Payer: Medicare Other | Admitting: Vascular Surgery

## 2022-04-06 ENCOUNTER — Ambulatory Visit (INDEPENDENT_AMBULATORY_CARE_PROVIDER_SITE_OTHER)
Admission: RE | Admit: 2022-04-06 | Discharge: 2022-04-06 | Disposition: A | Discharge status: Home / Self Care | Payer: Medicare Other | Source: Ambulatory Visit | Attending: Vascular Surgery | Admitting: Vascular Surgery

## 2022-04-06 ENCOUNTER — Encounter: Payer: Self-pay | Admitting: Vascular Surgery

## 2022-04-06 VITALS — BP 138/67 | HR 48 | Temp 97.9°F | Resp 20 | Ht 69.0 in | Wt 153.0 lb

## 2022-04-06 DIAGNOSIS — I70221 Atherosclerosis of native arteries of extremities with rest pain, right leg: Secondary | ICD-10-CM

## 2022-04-06 DIAGNOSIS — I739 Peripheral vascular disease, unspecified: Secondary | ICD-10-CM

## 2022-04-06 DIAGNOSIS — I872 Venous insufficiency (chronic) (peripheral): Secondary | ICD-10-CM | POA: Diagnosis not present

## 2022-04-06 NOTE — Progress Notes (Signed)
REASON FOR VISIT:   Follow-up of peripheral arterial disease and chronic venous insufficiency  MEDICAL ISSUES:   PERIPHERAL ARTERIAL DISEASE: This patient underwent kissing balloon stents of the common iliac arteries back in 2021.  The stents are patent.  He had presented with a nonhealing wound of the right foot which ultimately healed.  He  has some underlying infrainguinal arterial occlusive disease but is currently asymptomatic.  He is not a smoker.  He also has chronic venous insufficiency and we have previously discussed the importance of leg elevation and compression therapy.  He has been elevating his legs.  I have ordered follow-up ABIs and a duplex of his stents in 1 year and I will see him back at that time.  He knows to call sooner if he has problems.  HPI:   AMAHD MORINO is a pleasant 86 y.o. male who I last saw on 03/23/2021.  He has peripheral arterial disease and also chronic venous insufficiency. He underwent bilateral common iliac artery stenting in October 2021.  Of note, these were 8 mm VBX stents.  When I saw him last his stents were patent.  He was not a smoker.  I encouraged him to stay as active as possible.  Since I saw him last.  He has no specific complaints.  He has a prosthesis for his left AKA but does not use it as it hurts him.  Therefore I do not get any history of claudication.  He denies any history of rest pain.  He does have a very superficial wound on his right pretibial area where he bumped the right leg.  He does elevate his legs as we previously been following him with chronic venous insufficiency and he has been instructed on the proper position for this.  Past Medical History:  Diagnosis Date   Arteriosclerotic cardiovascular disease (ASCVD)    CABG in 1990s; 2002 total obstruction of LAD, CX and RCA with patent grafts and nl EF; Stress nuc. 2008 - mild LV dilation; normal EF; questionable small anteroapical scar; no ischemia   Arthritis    "left  knee" (11/08/2015)   Atrial fibrillation ([place])    Cellulitis 11/08/2015   "both feet"   Chronic anticoagulation    Dental crowns present    Diabetic peripheral neuropathy ([place])    bilateral lower leg, hands   History of blood transfusion    "related to my leg surgery?"   Hypertension    states under control with meds., has been on med. x "long time"   Iron deficiency anemia    Jaw cancer ([place]) 1990s   "squamous cell"   Myocardial infarction ([place]) 1990s   "after my jaw OR"   Pedal edema    "not a lot", per pt.   Peripheral vascular disease ([place])    Presence of retained hardware 07/2015   failed hardware jaw   Runny nose 07/27/2015   clear drainage, per pt.   Stroke [facility])    Type II diabetes mellitus ([place])    "diet controlled" (11/08/2015)    Family History  Problem Relation Age of Onset   Heart disease Mother     SOCIAL HISTORY: Social History   Tobacco Use   Smoking status: Never   Smokeless tobacco: Never  Substance Use Topics   Alcohol use: No    Alcohol/week: 0.0 standard drinks of alcohol    Allergies  Allergen Reactions   Lipitor [Atorvastatin] Other (See Comments)    SEVERE HEADACHE  Simvastatin Other (See Comments)    MUSCLE ACHES   Penicillins Rash    Has patient had a PCN reaction causing immediate rash, facial/tongue/throat swelling, SOB or lightheadedness with hypotension: Yes Has patient had a PCN reaction causing severe rash involving mucus membranes or skin necrosis: No Has patient had a PCN reaction that required hospitalization No Has patient had a PCN reaction occurring within the last 10 years: No If all of the above answers are "NO", then may proceed with Cephalosporin use.    Sulfa Antibiotics Rash    Tolerates silver sulfadiazine cream at home    Current Outpatient Medications  Medication Sig Dispense Refill   acetaminophen (TYLENOL) 325 MG tablet Take 2 tablets (650 mg total) by mouth every 6 (six) hours as needed for moderate pain.  30 tablet 0   amLODipine (NORVASC) 10 MG tablet TAKE 1 TABLET DAILY 90 tablet 1   carboxymethylcellul-glycerin (OPTIVE) 0.5-0.9 % ophthalmic solution Place 1 drop into both eyes 3 (three) times daily as needed for dry eyes.     cetirizine (ZYRTEC) 10 MG tablet Take 10 mg by mouth daily.     ELIQUIS 5 MG TABS tablet TAKE 1 TABLET TWICE A DAY 180 tablet 3   feeding supplement (BOOST / RESOURCE BREEZE) LIQD Take 1 Container by mouth 2 (two) times daily. (Patient taking differently: Take 237 mLs by mouth 2 (two) times daily.)     halobetasol (ULTRAVATE) 0.05 % cream Apply topically.     losartan-hydrochlorothiazide (HYZAAR) 100-12.5 MG tablet Take 1 tablet by mouth daily.     lovastatin (MEVACOR) 10 MG tablet Take 10 mg by mouth daily.      nebivolol (BYSTOLIC) 10 MG tablet Take 10 mg by mouth daily.     nystatin cream (MYCOSTATIN) Apply 1 application topically 3 (three) times daily as needed (skin irritation/rash.).      Omega-3 Fatty Acids (FISH OIL) 1200 MG CAPS Take 1,200 mg by mouth daily.     Pediatric Multivitamins-Iron (FLINTSTONES PLUS IRON PO) Take 1 tablet by mouth daily.     Probiotic Product ([facility]ON [facility]ALTH PO) Take 1 capsule by mouth daily.      silver sulfADIAZINE (SILVADENE) 1 % cream Apply 1 application topically daily.     No current facility-administered medications for this visit.    REVIEW OF SYSTEMS:  [X]  denotes positive finding, [ ]  denotes negative finding Cardiac  Comments:  Chest pain or chest pressure:    Shortness of breath upon exertion:    Short of breath when lying flat:    Irregular heart rhythm:        Vascular    Pain in calf, thigh, or hip brought on by ambulation:    Pain in feet at night that wakes you up from your sleep:     Blood clot in your veins:    Leg swelling:         Pulmonary    Oxygen at home:    Productive cough:     Wheezing:         Neurologic    Sudden weakness in arms or legs:     Sudden numbness in arms or legs:      Sudden onset of difficulty speaking or slurred speech:    Temporary loss of vision in one eye:     Problems with dizziness:         Gastrointestinal    Blood in stool:     Vomited blood:  Genitourinary    Burning when urinating:     Blood in urine:        Psychiatric    Major depression:         Hematologic    Bleeding problems:    Problems with blood clotting too easily:        Skin    Rashes or ulcers:        Constitutional    Fever or chills:     PHYSICAL EXAM:   Vitals:   04/06/22 0949  BP: 138/67  Pulse: (!) 48  Resp: 20  Temp: 97.9 F (36.6 C)  SpO2: 99%  Weight: 153 lb (69.4 kg)  Height: 5' 9"  (1.753 m)    GENERAL: The patient is a well-nourished male, in no acute distress. The vital signs are documented above. CARDIAC: There is a regular rate and rhythm.  VASCULAR: I do not detect carotid bruits. He has palpable femoral pulses.   I cannot palpate a right popliteal pulse. MUSCULOSKELETAL: He has a left AKA. NEUROLOGIC: No focal weakness or paresthesias are detected. SKIN: There are no ulcers or rashes noted. PSYCHIATRIC: The patient has a normal affect.  DATA:    AORTOILIAC DUPLEX: I have independently interpreted his duplex today.  This shows that his common iliac artery stents are patent bilaterally.  ARTERIAL DOPPLER STUDY: I have independently interpreted his arterial Doppler study today.  This was of the right lower extremity only as he has a left above-the-knee amputation.  On the right side he has a monophasic dorsalis pedis and posterior tibial signal.  ABI could not be obtained as the arteries were calcified.  His toe pressure was 98 mmHg.     Deitra Mayo [facility] [phone]

## 2022-05-03 ENCOUNTER — Emergency Department (HOSPITAL_COMMUNITY): Payer: Medicare Other

## 2022-05-03 ENCOUNTER — Emergency Department (HOSPITAL_COMMUNITY)
Admission: EM | Admit: 2022-05-03 | Discharge: 2022-05-03 | Disposition: A | Discharge status: Home / Self Care | Payer: Medicare Other | Attending: Emergency Medicine | Admitting: Emergency Medicine

## 2022-05-03 ENCOUNTER — Other Ambulatory Visit: Payer: Self-pay

## 2022-05-03 ENCOUNTER — Encounter (HOSPITAL_COMMUNITY): Payer: Self-pay | Admitting: Emergency Medicine

## 2022-05-03 DIAGNOSIS — M542 Cervicalgia: Secondary | ICD-10-CM | POA: Diagnosis present

## 2022-05-03 DIAGNOSIS — R519 Headache, unspecified: Secondary | ICD-10-CM | POA: Diagnosis not present

## 2022-05-03 DIAGNOSIS — Z7901 Long term (current) use of anticoagulants: Secondary | ICD-10-CM | POA: Diagnosis not present

## 2022-05-03 DIAGNOSIS — N179 Acute kidney failure, unspecified: Secondary | ICD-10-CM | POA: Diagnosis not present

## 2022-05-03 LAB — CBC WITH DIFFERENTIAL/PLATELET
Abs Immature Granulocytes: 0.08 10*3/uL — ABNORMAL HIGH (ref 0.00–0.07)
Basophils Absolute: 0.1 10*3/uL (ref 0.0–0.1)
Basophils Relative: 1 %
Eosinophils Absolute: 0.1 10*3/uL (ref 0.0–0.5)
Eosinophils Relative: 1 %
HCT: 30.3 % — ABNORMAL LOW (ref 39.0–52.0)
Hemoglobin: 10 g/dL — ABNORMAL LOW (ref 13.0–17.0)
Immature Granulocytes: 1 %
Lymphocytes Relative: 9 %
Lymphs Abs: 1.1 10*3/uL (ref 0.7–4.0)
MCH: 31.6 pg (ref 26.0–34.0)
MCHC: 33 g/dL (ref 30.0–36.0)
MCV: 95.9 fL (ref 80.0–100.0)
Monocytes Absolute: 1.2 10*3/uL — ABNORMAL HIGH (ref 0.1–1.0)
Monocytes Relative: 9 %
Neutro Abs: 10.3 10*3/uL — ABNORMAL HIGH (ref 1.7–7.7)
Neutrophils Relative %: 79 %
Platelets: 287 10*3/uL (ref 150–400)
RBC: 3.16 MIL/uL — ABNORMAL LOW (ref 4.22–5.81)
RDW: 15.1 % (ref 11.5–15.5)
WBC: 12.9 10*3/uL — ABNORMAL HIGH (ref 4.0–10.5)
nRBC: 0 % (ref 0.0–0.2)

## 2022-05-03 LAB — COMPREHENSIVE METABOLIC PANEL
ALT: 35 U/L (ref 0–44)
AST: 19 U/L (ref 15–41)
Albumin: 3.3 g/dL — ABNORMAL LOW (ref 3.5–5.0)
Alkaline Phosphatase: 137 U/L — ABNORMAL HIGH (ref 38–126)
Anion gap: 6 (ref 5–15)
BUN: 29 mg/dL — ABNORMAL HIGH (ref 8–23)
CO2: 14 mmol/L — ABNORMAL LOW (ref 22–32)
Calcium: 6.7 mg/dL — ABNORMAL LOW (ref 8.9–10.3)
Chloride: 119 mmol/L — ABNORMAL HIGH (ref 98–111)
Creatinine, Ser: 1.36 mg/dL — ABNORMAL HIGH (ref 0.61–1.24)
GFR, Estimated: 52 mL/min — ABNORMAL LOW (ref >60)
Glucose, Bld: 116 mg/dL — ABNORMAL HIGH (ref 70–99)
Potassium: 3.2 mmol/L — ABNORMAL LOW (ref 3.5–5.1)
Sodium: 139 mmol/L (ref 135–145)
Total Bilirubin: 0.6 mg/dL (ref 0.3–1.2)
Total Protein: 6.8 g/dL (ref 6.5–8.1)

## 2022-05-03 LAB — TROPONIN I (HIGH SENSITIVITY): Troponin I (High Sensitivity): 10 ng/L (ref <18)

## 2022-05-03 MED ORDER — LIDOCAINE 5 % EX PTCH
1.0000 | MEDICATED_PATCH | CUTANEOUS | Status: DC
Start: 2022-05-03 — End: 2022-05-03
  Administered 2022-05-03: 1 via TRANSDERMAL
  Filled 2022-05-03: qty 1

## 2022-05-03 NOTE — ED Notes (Signed)
Patient transported to CT 

## 2022-05-03 NOTE — ED Triage Notes (Signed)
Pt c/o left jaw/arm/neck pain that started earlier today.

## 2022-05-03 NOTE — ED Provider Notes (Signed)
[facility] EMERGENCY DEPARTMENT Provider Note   CSN: [id number] Arrival date & time: 05/03/22  7616     History  Chief Complaint  Patient presents with   Arm Pain    [name] is a 86 y.o. male.  86 year old male who presents the ER today with left-sided neck and shoulder pain.  Patient states that this started in the middle the night.  He states he woke up and had the left-sided pain just underneath his jaw.  This proceeded to radiate down towards his neck and then started having some left shoulder pain as well.  Patient states when he moves his head a certain way it hurts worse with moving his arm certain way hurts worse.  No associated shortness of breath, nausea, vomiting, diaphoresis, lightheadedness.  Has a history of stroke but does not feel similar to that.  Did not try thing for symptoms before coming here.  Was worried that he might be having a stroke or heart attack.  No focal deficits.   Arm Pain       Home Medications Prior to Admission medications   Medication Sig Start Date End Date Taking? Authorizing Provider  acetaminophen (TYLENOL) 325 MG tablet Take 2 tablets (650 mg total) by mouth every 6 (six) hours as needed for moderate pain. 11/27/21   Jaynee Eagles, PA-C  amLODipine (NORVASC) 10 MG tablet TAKE 1 TABLET DAILY 08/01/21   Satira Sark, MD  carboxymethylcellul-glycerin (OPTIVE) 0.5-0.9 % ophthalmic solution Place 1 drop into both eyes 3 (three) times daily as needed for dry eyes.    [provider]  cetirizine (ZYRTEC) 10 MG tablet Take 10 mg by mouth daily.    [provider]  ELIQUIS 5 MG TABS tablet TAKE 1 TABLET TWICE A DAY 11/21/21   Jerline Pain, MD  feeding supplement (BOOST / RESOURCE BREEZE) LIQD Take 1 Container by mouth 2 (two) times daily. Patient taking differently: Take 237 mLs by mouth 2 (two) times daily. 01/08/19   Donzetta Starch, NP  halobetasol (ULTRAVATE) 0.05 % cream Apply topically. 11/18/20   [provider]  losartan-hydrochlorothiazide (HYZAAR) 100-12.5 MG tablet Take 1 tablet by mouth daily.    [provider]  lovastatin (MEVACOR) 10 MG tablet Take 10 mg by mouth daily.     [provider]  nebivolol (BYSTOLIC) 10 MG tablet Take 10 mg by mouth daily.    [provider]  nystatin cream (MYCOSTATIN) Apply 1 application topically 3 (three) times daily as needed (skin irritation/rash.).     [provider]  Omega-3 Fatty Acids (FISH OIL) 1200 MG CAPS Take 1,200 mg by mouth daily.    [provider]  Pediatric Multivitamins-Iron (FLINTSTONES PLUS IRON PO) Take 1 tablet by mouth daily.    [provider]  Probiotic Product ([place]) Take 1 capsule by mouth daily.     [provider]  silver sulfADIAZINE (SILVADENE) 1 % cream Apply 1 application topically daily. 01/08/19   Donzetta Starch, NP      Allergies    Lipitor [atorvastatin], Simvastatin, Penicillins, and Sulfa antibiotics    Review of Systems   Review of Systems  Physical Exam Updated Vital Signs BP (!) 149/84   Pulse 65   Temp 98 F (36.7 C) (Oral)   Resp (!) 21   Ht 5' 8"  (1.727 m)   Wt 68 kg   SpO2 100%   BMI 22.81 kg/m  Physical Exam Vitals and  nursing note reviewed.  Constitutional:      Appearance: He is well-developed.  HENT:     Head: Normocephalic and atraumatic.  Eyes:     Pupils: Pupils are equal, round, and reactive to light.  Cardiovascular:     Rate and Rhythm: Normal rate.  Pulmonary:     Effort: Pulmonary effort is normal. No respiratory distress.  Abdominal:     General: Abdomen is flat. There is no distension.  Musculoskeletal:        General: Deformity (L AKA) present. Normal range of motion.     Cervical back: Normal range of motion.  Skin:    General: Skin is warm and dry.  Neurological:     General: No focal deficit present.     Mental Status: He is alert.     ED Results / Procedures / Treatments    Labs (all labs ordered are listed, but only abnormal results are displayed) Labs Reviewed  CBC WITH DIFFERENTIAL/PLATELET - Abnormal; Notable for the following components:      Result Value   WBC 12.9 (*)    RBC 3.16 (*)    Hemoglobin 10.0 (*)    HCT 30.3 (*)    Neutro Abs 10.3 (*)    Monocytes Absolute 1.2 (*)    Abs Immature Granulocytes 0.08 (*)    All other components within normal limits  COMPREHENSIVE METABOLIC PANEL - Abnormal; Notable for the following components:   Potassium 3.2 (*)    Chloride 119 (*)    CO2 14 (*)    Glucose, Bld 116 (*)    BUN 29 (*)    Creatinine, Ser 1.36 (*)    Calcium 6.7 (*)    Albumin 3.3 (*)    Alkaline Phosphatase 137 (*)    GFR, Estimated 52 (*)    All other components within normal limits  TROPONIN I (HIGH SENSITIVITY)    EKG None  Radiology DG Chest Portable 1 View  Result Date: 05/03/2022 CLINICAL DATA:  86 year old male with headache, left jaw, neck and arm pain. EXAM: PORTABLE CHEST 1 VIEW COMPARISON:  Portable chest 01/04/2019. FINDINGS: Portable AP upright view at 413 hours. Prior CABG. Stable sternotomy wires. Mild cardiomegaly. Other mediastinal contours are within normal limits. Visualized tracheal air column is within normal limits. Allowing for portable technique the lungs are clear. No pneumothorax or pleural effusion. Negative visible bowel gas. No acute osseous abnormality identified. Left neck surgical clips. IMPRESSION: No acute cardiopulmonary abnormality. Electronically Signed   By: Genevie Ann M.D.   On: 05/03/2022 04:55   CT Head Wo Contrast  Result Date: 05/03/2022 CLINICAL DATA:  86 year old male with headache, left jaw, neck and arm pain. EXAM: CT HEAD WITHOUT CONTRAST TECHNIQUE: Contiguous axial images were obtained from the base of the skull through the vertex without intravenous contrast. RADIATION DOSE REDUCTION: This exam was performed according to the departmental dose-optimization program which includes automated  exposure control, adjustment of the mA and/or kV according to patient size and/or use of iterative reconstruction technique. COMPARISON:  Brain MRI 01/05/2019.  CTA head and neck 01/04/2019. FINDINGS: Brain: Cerebral volume is not significantly changed since 2020. No midline shift, ventriculomegaly, mass effect, evidence of mass lesion, intracranial hemorrhage or evidence of cortically based acute infarction. Chronic lacunar infarct ventral left thalamus is stable. Patchy left greater than right cerebral white matter hypodensity has not significantly changed. Vascular: Calcified atherosclerosis at the skull base. No suspicious intracranial vascular hyperdensity. Skull: No acute osseous abnormality identified. Sinuses/Orbits: Visualized  paranasal sinuses and mastoids are stable and well aerated. Other: No acute orbit or scalp soft tissue finding. There is some calcified scalp vessel atherosclerosis. IMPRESSION: 1. No acute intracranial abnormality. 2. Chronic small vessel ischemia appears stable since 2020. Electronically Signed   By: Genevie Ann M.D.   On: 05/03/2022 04:53    Procedures Procedures    Medications Ordered in ED Medications  lidocaine (LIDODERM) 5 % 1 patch (1 patch Transdermal Patch Applied 05/03/22 0427)    ED Course/ Medical Decision Making/ A&P                           Medical Decision Making Amount and/or Complexity of Data Reviewed Labs: ordered. Radiology: ordered.  Risk Prescription drug management.   Within limitations of his deformity there is no obvious neurologic changes.  I suspect he has torticollis or some variation thereof.  Low suspicion for stroke or heart attack based on normal labs, chest x-ray and CT scan.  Bicarb is a bit lower than normal he will follow that up with his doctor I do not think is related to his symptoms today as he does not appear volume depleted.  Still makes urine.  Eating drinking normally.  Stable for discharge with supportive care and PCP  follow-up.   Final Clinical Impression(s) / ED Diagnoses Final diagnoses:  Neck pain on left side  AKI (acute kidney injury) [facility])    Rx / DC Orders ED Discharge Orders     None         Armilda Vanderlinden, Corene Cornea, MD 05/03/22 [phone]

## 2022-05-24 NOTE — Progress Notes (Signed)
Cardiology Office Note    Date:  05/25/2022   ID:  MAUI BRITTEN, DOB 03-08-1936, MRN [id number]  PCP:  Lemmie Evens, MD  Cardiologist: Candee Furbish, MD    Chief Complaint  Patient presents with   Follow-up    Recent Emergency Dept visit    History of Present Illness:    BENJERMAN MOLINELLI is a 86 y.o. male past history of CAD (s/p CABG in 1990's, patent bypass grafts by catheterization in 2002, low-risk NST in 2008), permanent atrial fibrillation, HTN, HLD, history of GI bleed, PAD (s/p L BKA) and prior CVA who presents to the office today for follow-up from his recent Emergency Department visit.  He was last examined by Dr. Marlou Porch in 07/2021 and activity was limited but he did denied any recent anginal symptoms. He was continued on his current cardiac medications at that time.  In the interim, he presented to [facility] ED on 05/03/2022 for evaluation of left arm/jaw pain which started earlier in the day. Troponin values were negative and symptoms were overall felt to be atypical for a cardiac etiology.  In talking with the patient and his wife today, he reports his pain was mostly along the posterior aspect of his neck and he did not have any chest pain or arm pain as documented that day. He says that his neck pain has improved with the use of Tylenol. While his activity is limited as he is mostly wheelchair-bound, he denies any chest pain or progressive dyspnea. No recent orthopnea or PND. He does experience intermittent lower extremity edema along his right leg but says weight has overall been stable on his home scales. He has been having frequent diarrhea and is scheduled to see his PCP next week for further discussion.   Past Medical History:  Diagnosis Date   Arteriosclerotic cardiovascular disease (ASCVD)    CABG in 1990s; 2002 total obstruction of LAD, CX and RCA with patent grafts and nl EF; Stress nuc. 2008 - mild LV dilation; normal EF; questionable small anteroapical scar;  no ischemia   Arthritis    "left knee" (11/08/2015)   Atrial fibrillation ([place])    Cellulitis 11/08/2015   "both feet"   Chronic anticoagulation    Dental crowns present    Diabetic peripheral neuropathy ([place])    bilateral lower leg, hands   History of blood transfusion    "related to my leg surgery?"   Hypertension    states under control with meds., has been on med. x "long time"   Iron deficiency anemia    Jaw cancer ([place]) 1990s   "squamous cell"   Myocardial infarction ([place]) 1990s   "after my jaw OR"   Pedal edema    "not a lot", per pt.   Peripheral vascular disease ([place])    Presence of retained hardware 07/2015   failed hardware jaw   Runny nose 07/27/2015   clear drainage, per pt.   Stroke ([place])    Type II diabetes mellitus ([place])    "diet controlled" (11/08/2015)    Past Surgical History:  Procedure Laterality Date   ABDOMINAL AORTOGRAM W/LOWER EXTREMITY Bilateral 08/20/2020   Procedure: ABDOMINAL AORTOGRAM W/LOWER EXTREMITY;  Surgeon: Angelia Mould, MD;  Location: [place] [facility];  Service: Cardiovascular;  Laterality: Bilateral;   AMPUTATION Left 11/11/2015   Procedure: LEFT ABOVE KNEE AMPUTATION;  Surgeon: Angelia Mould, MD;  Location: [place];  Service: Vascular;  Laterality: Left;   [place]; 04/16/2001  CATARACT EXTRACTION W/ INTRAOCULAR LENS  IMPLANT, BILATERAL Bilateral 09/2015   COLONOSCOPY N/A 11/27/2014   Procedure: COLONOSCOPY;  Surgeon: Rogene Houston, MD;  [facility];  Service: Endoscopy;  Laterality: N/A;  830   CORONARY ARTERY BYPASS GRAFT  1990's   "CABG X4"   ESOPHAGOGASTRODUODENOSCOPY N/A 02/14/2018   Procedure: ESOPHAGOGASTRODUODENOSCOPY (EGD);  Surgeon: Rogene Houston, MD;  [facility];  Service: Endoscopy;  Laterality: N/A;   ESOPHAGOGASTRODUODENOSCOPY (EGD) WITH PROPOFOL N/A 02/25/2018   Procedure: ESOPHAGOGASTRODUODENOSCOPY (EGD) WITH PROPOFOL;  Surgeon: Rogene Houston, MD;   [facility];  Service: Endoscopy;  Laterality: N/A;   FRACTURE SURGERY     GIVENS CAPSULE STUDY N/A 02/15/2018   Procedure: GIVENS CAPSULE STUDY;  Surgeon: Rogene Houston, MD;  [facility];  Service: Endoscopy;  Laterality: N/A;   GIVENS CAPSULE STUDY N/A 03/27/2018   Procedure: GIVENS CAPSULE STUDY;  Surgeon: Rogene Houston, MD;  [facility];  Service: Endoscopy;  Laterality: N/A;   I & D EXTREMITY Left 11/10/2015   Procedure: INCISION AND DRAINAGE LEFT FOOT, AMPUTATION OF LEFT THIRD TOE;  Surgeon: Conrad San Manuel, MD;  Location: [place];  Service: Vascular;  Laterality: Left;   INCISION AND DRAINAGE ABSCESS Left 06/16/2005   wide exc. abscess 5th toe   IR ANGIOGRAM VISCERAL SELECTIVE  03/28/2018   IR ANGIOGRAM VISCERAL SELECTIVE  03/28/2018   IR CT HEAD LTD  01/04/2019   IR PERCUTANEOUS ART THROMBECTOMY/INFUSION INTRACRANIAL INC DIAG ANGIO  01/04/2019   IR US GUIDE VASC ACCESS LEFT  03/28/2018   MANDIBULAR HARDWARE REMOVAL Left 08/02/2015   Procedure:  HARDWARE REMOVAL TWO MANDIBULAR SCREWS;  Surgeon: Jannette Fogo, DDS;  Location: [place];  Service: Oral Surgery;  Laterality: Left;   ORIF TIBIA FRACTURE Left ~ [place]  N/A 08/14/2016   Procedure: Abdominal Aortogram w/Lower Extremity;  Surgeon: Angelia Mould, MD;  Location: [place] [facility];  Service: Cardiovascular;  Laterality: N/A;   PERIPHERAL VASCULAR INTERVENTION Bilateral 08/20/2020   Procedure: PERIPHERAL VASCULAR INTERVENTION;  Surgeon: Angelia Mould, MD;  Location: [place] [facility];  Service: Cardiovascular;  Laterality: Bilateral;  iliac stents   RADIOLOGY WITH ANESTHESIA N/A 01/04/2019   Procedure: CODE STROKE;  Surgeon: Radiologist, Medication, MD;  Location: [place];  Service: Radiology;  Laterality: N/A;   SQUAMOUS CELL CARCINOMA EXCISION Left 1993   "took my jaw out; got cadavar in there now"   TONSILLECTOMY      Current  Medications: Outpatient Medications Prior to Visit  Medication Sig Dispense Refill   acetaminophen (TYLENOL) 325 MG tablet Take 2 tablets (650 mg total) by mouth every 6 (six) hours as needed for moderate pain. 30 tablet 0   amLODipine (NORVASC) 10 MG tablet TAKE 1 TABLET DAILY 90 tablet 1   carboxymethylcellul-glycerin (OPTIVE) 0.5-0.9 % ophthalmic solution Place 1 drop into both eyes 3 (three) times daily as needed for dry eyes.     cetirizine (ZYRTEC) 10 MG tablet Take 10 mg by mouth daily.     ELIQUIS 5 MG TABS tablet TAKE 1 TABLET TWICE A DAY 180 tablet 3   feeding supplement (BOOST / RESOURCE BREEZE) LIQD Take 1 Container by mouth 2 (two) times daily. (Patient taking differently: Take 237 mLs by mouth 2 (two) times daily.)     halobetasol (ULTRAVATE) 0.05 % cream Apply topically.     losartan-hydrochlorothiazide (HYZAAR) 100-12.5 MG tablet Take 1 tablet by  mouth daily.     lovastatin (MEVACOR) 10 MG tablet Take 10 mg by mouth daily.      nebivolol (BYSTOLIC) 10 MG tablet Take 10 mg by mouth daily.     nystatin cream (MYCOSTATIN) Apply 1 application topically 3 (three) times daily as needed (skin irritation/rash.).      Omega-3 Fatty Acids (FISH OIL) 1200 MG CAPS Take 1,200 mg by mouth daily.     Pediatric Multivitamins-Iron (FLINTSTONES PLUS IRON PO) Take 1 tablet by mouth daily.     Probiotic Product ([place] PO) Take 1 capsule by mouth daily.      silver sulfADIAZINE (SILVADENE) 1 % cream Apply 1 application topically daily.     No facility-administered medications prior to visit.     Allergies:   Lipitor [atorvastatin], Simvastatin, Penicillins, and Sulfa antibiotics   Social History   Socioeconomic History   Marital status: Married    Spouse name: Not on file   Number of children: Not on file   Years of education: Not on file   Highest education level: Not on file  Occupational History   Not on file  Tobacco Use   Smoking status: Never   Smokeless tobacco:  Never  Vaping Use   Vaping Use: Never used  Substance and Sexual Activity   Alcohol use: No    Alcohol/week: 0.0 standard drinks of alcohol   Drug use: No   Sexual activity: Yes  Other Topics Concern   Not on file  Social History Narrative   Not on file   Social Determinants of Health   Financial Resource Strain: Not on file  Food Insecurity: Not on file  Transportation Needs: Not on file  Physical Activity: Not on file  Stress: Not on file  Social Connections: Not on file     Family History:  The patient's family history includes Heart disease in his mother.   Review of Systems:    Please see the history of present illness.     All other systems reviewed and are otherwise negative except as noted above.   Physical Exam:    VS:  BP (!) 110/52   Pulse 61   Ht 5' 9"  (1.753 m)   Wt 153 lb (69.4 kg)   SpO2 99%   BMI 22.59 kg/m    General: Pleasant, elderly male appearing in no acute distress. Head: Normocephalic, atraumatic. Neck: No carotid bruits. JVD not elevated.  Lungs: Respirations regular and unlabored, without wheezes or rales.  Heart: Irregular irregular. No S3 or S4.  No murmur, no rubs, or gallops appreciated. Abdomen: Appears non-distended. No obvious abdominal masses. Msk:  Strength and tone appear normal for age. No obvious joint deformities or effusions. Extremities: No clubbing or cyanosis.  Left AKA.  Neuro: Alert and oriented X 3. Moves all extremities spontaneously. No focal deficits noted. Psych:  Responds to questions appropriately with a normal affect. Skin: No rashes or lesions noted  Wt Readings from Last 3 Encounters:  05/25/22 153 lb (69.4 kg)  05/03/22 150 lb (68 kg)  04/06/22 153 lb (69.4 kg)      Studies/Labs Reviewed:   EKG:  EKG is not ordered today.    Recent Labs: 05/03/2022: ALT 35; BUN 29; Creatinine, Ser 1.36; Hemoglobin 10.0; Platelets 287; Potassium 3.2; Sodium 139   Lipid Panel    Component Value Date/Time   CHOL  90 01/05/2019 0611   TRIG 116 01/07/2019 2022   HDL 30 (L) 01/05/2019 0611   CHOLHDL  3.0 01/05/2019 0611   VLDL 16 01/05/2019 0611   LDLCALC 44 01/05/2019 0611    Additional studies/ records that were reviewed today include:   Echocardiogram: 12/2018 IMPRESSIONS     1. The left ventricle has a visually estimated ejection fraction of 55%.  The cavity size was normal. Left ventricular diastolic function could not  be evaluated secondary to atrial fibrillation.   2. The mitral valve is abnormal. Mild thickening of the mitral valve  leaflet.   3. The tricuspid valve is grossly normal.   4. The aortic valve is grossly normal. Mild thickening of the aortic  valve. Mild calcification of the aortic valve. Moderate annular  calcification. Aortic valve regurgitation is mild by color flow Doppler.   5. The aortic root is normal in size and structure.   6. Left atrial size was severely dilated.   7. Right atrial size was moderately dilated.   8. The inferior vena cava was dilated in size with <50% respiratory  variability.   9. Pulmonary hypertension is mild.   Assessment:    1. Coronary artery disease involving native coronary artery of native heart without angina pectoris   2. Permanent atrial fibrillation ([place])   3. Medication management   4. Essential hypertension   5. Hyperlipidemia LDL goal <70   6. Hypokalemia      Plan:   In order of problems listed above:  1. CAD - He is s/p CABG in 1990's with patent bypass grafts by catheterization in 2002 and low-risk NST in 2008.  While activity is limited secondary to prior amputation, he denies any recent anginal symptoms. If he did have concerning symptoms in the future, would recommend a Lexiscan Myoview. Continue current medical therapy with Amlodipine 10 mg daily, Bystolic 10 mg daily and Lovastatin 10 mg daily. He is not on ASA given the need for anticoagulation.  2. Permanent Atrial Fibrillation - He denies any recent  palpitations and his heart rate is well controlled in the 60's today. Continue Bystolic 10 mg daily for rate control. - No reports of active bleeding. He is on Eliquis 5 mg twice daily for anticoagulation which is the appropriate dose at this time given his age, weight and renal function. His hemoglobin was low at 10.0 when checked last month and he reports this was further worked up by his PCP and he has been started on B12 supplementation.  3.  HLD - Followed by his PCP. He remains on Lovastatin 10 mg daily and has previously been intolerant to higher intensity statin therapy. LDL was at 31 when checked in 09/2021.    4. HTN - His BP is well controlled at 110/52 during today's visit. Continue Amlodipine 10 mg daily, Losartan-HCTZ 100-12.5 mg daily and Bystolic 10 mg daily.   5. Hypokalemia  - His potassium was low at 3.2 last month and he does not think this has been rechecked in the interim. He has been experiencing intermittent diarrhea and I suspect this along with being on HCTZ has led to his hypokalemia. I did recommend repeat labs and he will have these obtained next week. If K+ remains low, he will likely need to be started on potassium supplementation. He will try increasing his intake of potassium rich foods in the interim until repeat labs are obtained. Also encouraged him to review further options for treatment of his diarrhea with his PCP as he may require GI referral since Imodium has not helped.     Medication Adjustments/Labs and  Tests Ordered: Current medicines are reviewed at length with the patient today.  Concerns regarding medicines are outlined above.  Medication changes, Labs and Tests ordered today are listed in the Patient Instructions below. Patient Instructions  Medication Instructions:  Your physician recommends that you continue on your current medications as directed. Please refer to the Current Medication list given to you today.   Labwork: BMET in 1  week  Testing/Procedures: None today  Follow-Up: 6 months Dr.Skains  Any Other Special Instructions Will Be Listed Below (If Applicable).  If you need a refill on your cardiac medications before your next appointment, please call your pharmacy.    Signed, Erma Heritage, PA-C  05/25/2022 5:42 PM    [facility] [address] [place], [place] [address] Phone: [phone] Fax: [phone]

## 2022-05-25 ENCOUNTER — Ambulatory Visit (INDEPENDENT_AMBULATORY_CARE_PROVIDER_SITE_OTHER): Payer: Medicare Other | Admitting: Student

## 2022-05-25 ENCOUNTER — Encounter: Payer: Self-pay | Admitting: Student

## 2022-05-25 VITALS — BP 110/52 | HR 61 | Ht 69.0 in | Wt 153.0 lb

## 2022-05-25 DIAGNOSIS — E785 Hyperlipidemia, unspecified: Secondary | ICD-10-CM

## 2022-05-25 DIAGNOSIS — E876 Hypokalemia: Secondary | ICD-10-CM

## 2022-05-25 DIAGNOSIS — I4821 Permanent atrial fibrillation: Secondary | ICD-10-CM | POA: Diagnosis not present

## 2022-05-25 DIAGNOSIS — I70221 Atherosclerosis of native arteries of extremities with rest pain, right leg: Secondary | ICD-10-CM | POA: Diagnosis not present

## 2022-05-25 DIAGNOSIS — Z79899 Other long term (current) drug therapy: Secondary | ICD-10-CM | POA: Diagnosis not present

## 2022-05-25 DIAGNOSIS — I10 Essential (primary) hypertension: Secondary | ICD-10-CM

## 2022-05-25 DIAGNOSIS — I2510 Atherosclerotic heart disease of native coronary artery without angina pectoris: Secondary | ICD-10-CM | POA: Diagnosis not present

## 2022-05-25 NOTE — Patient Instructions (Signed)
Medication Instructions:  Your physician recommends that you continue on your current medications as directed. Please refer to the Current Medication list given to you today.   Labwork: BMET in 1 week  Testing/Procedures: None today  Follow-Up: 6 months Dr.Skains  Any Other Special Instructions Will Be Listed Below (If Applicable).  If you need a refill on your cardiac medications before your next appointment, please call your pharmacy.

## 2022-06-23 ENCOUNTER — Inpatient Hospital Stay (HOSPITAL_COMMUNITY)
Admission: EM | Admit: 2022-06-23 | Discharge: 2022-06-27 | DRG: 683 | Disposition: A | Discharge status: Home / Self Care | Payer: Medicare Other | Attending: Internal Medicine | Admitting: Internal Medicine

## 2022-06-23 ENCOUNTER — Other Ambulatory Visit: Payer: Self-pay

## 2022-06-23 ENCOUNTER — Telehealth: Payer: Self-pay | Admitting: Cardiology

## 2022-06-23 ENCOUNTER — Encounter (HOSPITAL_COMMUNITY): Payer: Self-pay | Admitting: Emergency Medicine

## 2022-06-23 ENCOUNTER — Other Ambulatory Visit (HOSPITAL_COMMUNITY)
Admission: RE | Admit: 2022-06-23 | Discharge: 2022-06-23 | Disposition: A | Discharge status: Home / Self Care | Payer: Medicare Other | Source: Ambulatory Visit | Attending: Student | Admitting: Student

## 2022-06-23 DIAGNOSIS — Z7901 Long term (current) use of anticoagulants: Secondary | ICD-10-CM

## 2022-06-23 DIAGNOSIS — I2510 Atherosclerotic heart disease of native coronary artery without angina pectoris: Secondary | ICD-10-CM | POA: Diagnosis present

## 2022-06-23 DIAGNOSIS — K529 Noninfective gastroenteritis and colitis, unspecified: Secondary | ICD-10-CM | POA: Diagnosis present

## 2022-06-23 DIAGNOSIS — R7303 Prediabetes: Secondary | ICD-10-CM

## 2022-06-23 DIAGNOSIS — E1122 Type 2 diabetes mellitus with diabetic chronic kidney disease: Secondary | ICD-10-CM | POA: Diagnosis present

## 2022-06-23 DIAGNOSIS — I447 Left bundle-branch block, unspecified: Secondary | ICD-10-CM | POA: Diagnosis present

## 2022-06-23 DIAGNOSIS — E1151 Type 2 diabetes mellitus with diabetic peripheral angiopathy without gangrene: Secondary | ICD-10-CM | POA: Diagnosis present

## 2022-06-23 DIAGNOSIS — E876 Hypokalemia: Secondary | ICD-10-CM

## 2022-06-23 DIAGNOSIS — I129 Hypertensive chronic kidney disease with stage 1 through stage 4 chronic kidney disease, or unspecified chronic kidney disease: Secondary | ICD-10-CM | POA: Diagnosis present

## 2022-06-23 DIAGNOSIS — E1142 Type 2 diabetes mellitus with diabetic polyneuropathy: Secondary | ICD-10-CM | POA: Diagnosis present

## 2022-06-23 DIAGNOSIS — I10 Essential (primary) hypertension: Secondary | ICD-10-CM | POA: Diagnosis present

## 2022-06-23 DIAGNOSIS — E1121 Type 2 diabetes mellitus with diabetic nephropathy: Secondary | ICD-10-CM | POA: Diagnosis present

## 2022-06-23 DIAGNOSIS — E785 Hyperlipidemia, unspecified: Secondary | ICD-10-CM | POA: Diagnosis present

## 2022-06-23 DIAGNOSIS — E1149 Type 2 diabetes mellitus with other diabetic neurological complication: Secondary | ICD-10-CM | POA: Diagnosis not present

## 2022-06-23 DIAGNOSIS — N1831 Chronic kidney disease, stage 3a: Secondary | ICD-10-CM | POA: Diagnosis present

## 2022-06-23 DIAGNOSIS — Z951 Presence of aortocoronary bypass graft: Secondary | ICD-10-CM | POA: Diagnosis not present

## 2022-06-23 DIAGNOSIS — I4821 Permanent atrial fibrillation: Secondary | ICD-10-CM | POA: Diagnosis present

## 2022-06-23 DIAGNOSIS — N179 Acute kidney failure, unspecified: Secondary | ICD-10-CM | POA: Diagnosis present

## 2022-06-23 DIAGNOSIS — D509 Iron deficiency anemia, unspecified: Secondary | ICD-10-CM | POA: Diagnosis present

## 2022-06-23 DIAGNOSIS — E860 Dehydration: Secondary | ICD-10-CM | POA: Diagnosis present

## 2022-06-23 DIAGNOSIS — Z79899 Other long term (current) drug therapy: Secondary | ICD-10-CM

## 2022-06-23 DIAGNOSIS — I252 Old myocardial infarction: Secondary | ICD-10-CM | POA: Diagnosis not present

## 2022-06-23 DIAGNOSIS — Z888 Allergy status to other drugs, medicaments and biological substances status: Secondary | ICD-10-CM

## 2022-06-23 DIAGNOSIS — N189 Chronic kidney disease, unspecified: Secondary | ICD-10-CM

## 2022-06-23 DIAGNOSIS — Z882 Allergy status to sulfonamides status: Secondary | ICD-10-CM

## 2022-06-23 DIAGNOSIS — I739 Peripheral vascular disease, unspecified: Secondary | ICD-10-CM | POA: Diagnosis not present

## 2022-06-23 DIAGNOSIS — Z8249 Family history of ischemic heart disease and other diseases of the circulatory system: Secondary | ICD-10-CM | POA: Diagnosis not present

## 2022-06-23 DIAGNOSIS — Z89612 Acquired absence of left leg above knee: Secondary | ICD-10-CM | POA: Diagnosis not present

## 2022-06-23 DIAGNOSIS — Z8673 Personal history of transient ischemic attack (TIA), and cerebral infarction without residual deficits: Secondary | ICD-10-CM

## 2022-06-23 DIAGNOSIS — E119 Type 2 diabetes mellitus without complications: Secondary | ICD-10-CM

## 2022-06-23 DIAGNOSIS — Z880 Allergy status to penicillin: Secondary | ICD-10-CM

## 2022-06-23 LAB — BASIC METABOLIC PANEL
Anion gap: 13 (ref 5–15)
Anion gap: 9 (ref 5–15)
BUN: 71 mg/dL — ABNORMAL HIGH (ref 8–23)
BUN: 74 mg/dL — ABNORMAL HIGH (ref 8–23)
CO2: 9 mmol/L — ABNORMAL LOW (ref 22–32)
CO2: 12 mmol/L — ABNORMAL LOW (ref 22–32)
Calcium: 4.3 mg/dL — CL (ref 8.9–10.3)
Calcium: 4.3 mg/dL — CL (ref 8.9–10.3)
Chloride: 118 mmol/L — ABNORMAL HIGH (ref 98–111)
Chloride: 118 mmol/L — ABNORMAL HIGH (ref 98–111)
Creatinine, Ser: 4.28 mg/dL — ABNORMAL HIGH (ref 0.61–1.24)
Creatinine, Ser: 4.43 mg/dL — ABNORMAL HIGH (ref 0.61–1.24)
EGFR: 13
GFR, Estimated: 13 mL/min — ABNORMAL LOW (ref >60)
GFR, Estimated: 13 mL/min — ABNORMAL LOW (ref >60)
Glucose, Bld: 142 mg/dL — ABNORMAL HIGH (ref 70–99)
Glucose, Bld: 137 mg/dL — ABNORMAL HIGH (ref 70–99)
Potassium: 2.4 mmol/L — CL (ref 3.5–5.1)
Potassium: 2.7 mmol/L — CL (ref 3.5–5.1)
Sodium: 140 mmol/L (ref 135–145)
Sodium: 139 mmol/L (ref 135–145)

## 2022-06-23 LAB — URINALYSIS, ROUTINE W REFLEX MICROSCOPIC
Bilirubin Urine: NEGATIVE
Glucose, UA: NEGATIVE mg/dL
Ketones, ur: NEGATIVE mg/dL
Nitrite: POSITIVE — AB
Protein, ur: 30 mg/dL — AB
Specific Gravity, Urine: 1.009 (ref 1.005–1.030)
WBC, UA: >50 WBC/hpf — ABNORMAL HIGH (ref 0–5)
pH: 5 (ref 5.0–8.0)

## 2022-06-23 LAB — CBC
HCT: 28.3 % — ABNORMAL LOW (ref 39.0–52.0)
Hemoglobin: 9.8 g/dL — ABNORMAL LOW (ref 13.0–17.0)
MCH: 31.3 pg (ref 26.0–34.0)
MCHC: 34.6 g/dL (ref 30.0–36.0)
MCV: 90.4 fL (ref 80.0–100.0)
Platelets: 353 10*3/uL (ref 150–400)
RBC: 3.13 MIL/uL — ABNORMAL LOW (ref 4.22–5.81)
RDW: 16.7 % — ABNORMAL HIGH (ref 11.5–15.5)
WBC: 10 10*3/uL (ref 4.0–10.5)
nRBC: 0 % (ref 0.0–0.2)

## 2022-06-23 LAB — MAGNESIUM
Magnesium: 0.9 mg/dL — CL (ref 1.7–2.4)
Magnesium: 2.2 mg/dL (ref 1.7–2.4)

## 2022-06-23 LAB — HEPATIC FUNCTION PANEL
ALT: 26 U/L (ref 0–44)
AST: 23 U/L (ref 15–41)
Albumin: 2.9 g/dL — ABNORMAL LOW (ref 3.5–5.0)
Alkaline Phosphatase: 126 U/L (ref 38–126)
Bilirubin, Direct: 0.2 mg/dL (ref 0.0–0.2)
Indirect Bilirubin: 0.7 mg/dL (ref 0.3–0.9)
Total Bilirubin: 0.9 mg/dL (ref 0.3–1.2)
Total Protein: 6.4 g/dL — ABNORMAL LOW (ref 6.5–8.1)

## 2022-06-23 LAB — ALBUMIN: Albumin: 3 g/dL — ABNORMAL LOW (ref 3.5–5.0)

## 2022-06-23 LAB — PHOSPHORUS: Phosphorus: 6 mg/dL — ABNORMAL HIGH (ref 2.5–4.6)

## 2022-06-23 LAB — SODIUM, URINE, RANDOM: Sodium, Ur: 45 mmol/L

## 2022-06-23 LAB — GLUCOSE, CAPILLARY: Glucose-Capillary: 113 mg/dL — ABNORMAL HIGH (ref 70–99)

## 2022-06-23 LAB — CBG MONITORING, ED: Glucose-Capillary: 114 mg/dL — ABNORMAL HIGH (ref 70–99)

## 2022-06-23 MED ORDER — MAGNESIUM SULFATE 2 GM/50ML IV SOLN: 2.0000 g | Freq: Once | INTRAVENOUS | Status: DC

## 2022-06-23 MED ORDER — ACETAMINOPHEN 650 MG RE SUPP: 650.0000 mg | Freq: Four times a day (QID) | RECTAL | Status: DC | PRN

## 2022-06-23 MED ORDER — POTASSIUM CHLORIDE 10 MEQ/100ML IV SOLN: 10.0000 meq | INTRAVENOUS | Status: DC

## 2022-06-23 MED ORDER — CHLORHEXIDINE GLUCONATE CLOTH 2 % EX PADS: 6.0000 | MEDICATED_PAD | Freq: Every day | CUTANEOUS | Status: DC

## 2022-06-23 MED ORDER — POLYVINYL ALCOHOL 1.4 % OP SOLN: 1.0000 [drp] | OPHTHALMIC | Status: DC | PRN

## 2022-06-23 MED ORDER — POTASSIUM CHLORIDE CRYS ER 20 MEQ PO TBCR
20.0000 meq | EXTENDED_RELEASE_TABLET | Freq: Once | ORAL | Status: AC
  Administered 2022-06-23: 20 meq via ORAL
  Filled 2022-06-23: qty 1

## 2022-06-23 MED ORDER — SODIUM CHLORIDE 0.9 % IV BOLUS
1000.0000 mL | Freq: Once | INTRAVENOUS | Status: AC
  Administered 2022-06-23: 1000 mL via INTRAVENOUS

## 2022-06-23 MED ORDER — CALCIUM GLUCONATE-NACL 1-0.675 GM/50ML-% IV SOLN
1.0000 g | Freq: Once | INTRAVENOUS | Status: AC
Start: 2022-06-23 — End: 2022-06-23
  Administered 2022-06-23: 1000 mg via INTRAVENOUS
  Filled 2022-06-23: qty 50

## 2022-06-23 MED ORDER — CALCIUM CARBONATE 1250 (500 CA) MG PO TABS
1000.0000 mg | ORAL_TABLET | Freq: Once | ORAL | Status: AC
  Administered 2022-06-23: 2500 mg via ORAL
  Filled 2022-06-23: qty 2

## 2022-06-23 MED ORDER — CALCIUM GLUCONATE-NACL 1-0.675 GM/50ML-% IV SOLN
1.0000 g | Freq: Once | INTRAVENOUS | Status: AC
  Administered 2022-06-23: 1000 mg via INTRAVENOUS

## 2022-06-23 MED ORDER — CALCIUM CARBONATE 1250 (500 CA) MG PO TABS
1000.0000 mg | ORAL_TABLET | Freq: Once | ORAL | Status: AC
  Administered 2022-06-23: 2500 mg via ORAL
  Filled 2022-06-23 (×2): qty 2

## 2022-06-23 MED ORDER — ACETAMINOPHEN 325 MG PO TABS: 650.0000 mg | ORAL_TABLET | Freq: Four times a day (QID) | ORAL | Status: DC | PRN

## 2022-06-23 MED ORDER — CARBOXYMETHYLCELLUL-GLYCERIN 0.5-0.9 % OP SOLN: 1.0000 [drp] | Freq: Every day | OPHTHALMIC | Status: DC | PRN

## 2022-06-23 MED ORDER — POTASSIUM CHLORIDE 10 MEQ/100ML IV SOLN
10.0000 meq | INTRAVENOUS | Status: AC
  Administered 2022-06-23 (×4): 10 meq via INTRAVENOUS
  Filled 2022-06-23 (×3): qty 100

## 2022-06-23 MED ORDER — SODIUM BICARBONATE 650 MG PO TABS
650.0000 mg | ORAL_TABLET | Freq: Three times a day (TID) | ORAL | Status: DC
  Administered 2022-06-23 – 2022-06-24 (×3): 650 mg via ORAL
  Filled 2022-06-23 (×3): qty 1

## 2022-06-23 MED ORDER — POTASSIUM CHLORIDE 10 MEQ/100ML IV SOLN
10.0000 meq | INTRAVENOUS | Status: DC
  Administered 2022-06-23 (×2): 10 meq via INTRAVENOUS
  Filled 2022-06-23 (×2): qty 100

## 2022-06-23 MED ORDER — PRAVASTATIN SODIUM 10 MG PO TABS
10.0000 mg | ORAL_TABLET | Freq: Every day | ORAL | Status: DC
  Administered 2022-06-24 – 2022-06-26 (×3): 10 mg via ORAL
  Filled 2022-06-23 (×3): qty 1

## 2022-06-23 MED ORDER — APIXABAN 2.5 MG PO TABS
2.5000 mg | ORAL_TABLET | Freq: Two times a day (BID) | ORAL | Status: DC
  Administered 2022-06-24 – 2022-06-27 (×7): 2.5 mg via ORAL
  Filled 2022-06-23 (×7): qty 1

## 2022-06-23 MED ORDER — MAGNESIUM SULFATE 4 GM/100ML IV SOLN
4.0000 g | Freq: Once | INTRAVENOUS | Status: AC
  Administered 2022-06-23: 4 g via INTRAVENOUS
  Filled 2022-06-23: qty 100

## 2022-06-23 MED ORDER — POTASSIUM CHLORIDE 10 MEQ/100ML IV SOLN
10.0000 meq | INTRAVENOUS | Status: DC
  Administered 2022-06-23: 10 meq via INTRAVENOUS

## 2022-06-23 NOTE — Telephone Encounter (Signed)
Potassium- 2.4  Calcium- 4.3

## 2022-06-23 NOTE — ED Notes (Signed)
K+ & Ca levels critical results called to EDP

## 2022-06-23 NOTE — ED Provider Notes (Signed)
[facility] EMERGENCY DEPARTMENT Provider Note   CSN: [id number] Arrival date & time: 06/23/22  1235     History  Chief Complaint  Patient presents with   Weakness    [name] is a 86 y.o. male with history of CAD, atrial fibrillation on Xarelto, diabetes, peripheral neuropathy, hypertension, peripheral vascular disease, squamous cell jaw cancer, s/p left BKA who presents the emergency department for abnormal labs.  Patient had routine labs performed prior to cardiology visit and was called with critical results and told to go to the emergency department.  They were told that the patient's kidney function was worsening, with electrolyte abnormalities.  Patient states for the past several days after getting back from the beach he is not felt very well.  He has been having intermittent dizziness, and some nausea when trying to eat.  Denies any chest pain or palpitations.    His wife had contacted the cardiologist regarding the patient having some numbness in his face and his right foot.  They report that he had been feeling short of breath for the past 2 to 3 weeks with little to no exertion.  Patient overall has not been eating very well, and states he is just not been very hungry.  He had not taken his Eliquis for the past few evenings, but did take his morning dose on 8/31, and did not complain of face numbness that day.  After he took his evening dose on 8/31 he started having some face numbness again.  He checked his EKG through his Apple Watch and it showed that he was in atrial fibrillation.   Weakness Associated symptoms: dizziness, nausea and shortness of breath   Associated symptoms: no abdominal pain, no chest pain, no diarrhea and no vomiting        Home Medications Prior to Admission medications   Medication Sig Start Date End Date Taking? Authorizing Provider  acetaminophen (TYLENOL) 325 MG tablet Take 2 tablets (650 mg total) by mouth every 6 (six) hours as needed for  moderate pain. Patient taking differently: Take 1,000 mg by mouth every 6 (six) hours as needed for moderate pain. 11/27/21  Yes Jaynee Eagles, PA-C  amLODipine (NORVASC) 10 MG tablet TAKE 1 TABLET DAILY 08/01/21  Yes Satira Sark, MD  ammonium lactate (AMLACTIN) 12 % cream Apply topically 2 (two) times daily. 06/14/22  Yes [provider]  carboxymethylcellul-glycerin (OPTIVE) 0.5-0.9 % ophthalmic solution Place 1 drop into both eyes daily as needed for dry eyes.   Yes [provider]  cetirizine (ZYRTEC) 10 MG tablet Take 10 mg by mouth daily.   Yes [provider]  ELIQUIS 5 MG TABS tablet TAKE 1 TABLET TWICE A DAY 11/21/21  Yes Jerline Pain, MD  feeding supplement (BOOST / RESOURCE BREEZE) LIQD Take 1 Container by mouth 2 (two) times daily. Patient taking differently: Take 237 mLs by mouth 2 (two) times daily. 01/08/19  Yes Donzetta Starch, NP  halobetasol (ULTRAVATE) 0.05 % cream Apply topically. 11/18/20  Yes [provider]  lovastatin (MEVACOR) 10 MG tablet Take 10 mg by mouth daily.    Yes [provider]  nebivolol (BYSTOLIC) 10 MG tablet Take 10 mg by mouth daily.   Yes [provider]  nystatin cream (MYCOSTATIN) Apply 1 application topically 3 (three) times daily as needed (skin irritation/rash.).    Yes [provider]  Omega-3 Fatty Acids (FISH OIL) 1200 MG CAPS Take 1,200 mg by mouth daily.  Yes [provider]  Pediatric Multivitamins-Iron (FLINTSTONES PLUS IRON PO) Take 1 tablet by mouth daily.   Yes [provider]  Probiotic Product ([place]) Take 1 capsule by mouth daily.    Yes [provider]  silver sulfADIAZINE (SILVADENE) 1 % cream Apply 1 application topically daily. 01/08/19  Yes Donzetta Starch, NP      Allergies    Lipitor [atorvastatin], Simvastatin, Penicillins, and Sulfa antibiotics    Review of Systems   Review of Systems  Constitutional:  Positive for  appetite change and fatigue.  Respiratory:  Positive for shortness of breath.   Cardiovascular:  Negative for chest pain, palpitations and leg swelling.  Gastrointestinal:  Positive for nausea. Negative for abdominal pain, constipation, diarrhea and vomiting.  Neurological:  Positive for dizziness and weakness.  All other systems reviewed and are negative.   Physical Exam Updated Vital Signs BP 107/81   Pulse 60   Temp (!) 97.5 F (36.4 C) (Oral)   Resp 18   Ht 5' 3"  (1.6 m)   Wt 68 kg   SpO2 100%   BMI 26.57 kg/m  Physical Exam Vitals and nursing note reviewed.  Constitutional:      General: He is awake.     Appearance: Normal appearance.     Comments: Appears awake but tired  HENT:     Head: Normocephalic and atraumatic.  Eyes:     Conjunctiva/sclera: Conjunctivae normal.  Cardiovascular:     Rate and Rhythm: Normal rate. Rhythm irregularly irregular.     Comments: S/p left BKA Pulmonary:     Effort: Pulmonary effort is normal. No respiratory distress.     Breath sounds: Normal breath sounds.  Abdominal:     General: There is no distension.     Palpations: Abdomen is soft.     Tenderness: There is no abdominal tenderness.  Musculoskeletal:     Right lower leg: No edema.  Skin:    General: Skin is warm and dry.     Coloration: Skin is pale.  Neurological:     General: No focal deficit present.     ED Results / Procedures / Treatments   Labs (all labs ordered are listed, but only abnormal results are displayed) Labs Reviewed  BASIC METABOLIC PANEL - Abnormal; Notable for the following components:      Result Value   Potassium 2.7 (*)    Chloride 118 (*)    CO2 12 (*)    Glucose, Bld 137 (*)    BUN 74 (*)    Creatinine, Ser 4.43 (*)    Calcium 4.3 (*)    GFR, Estimated 13 (*)    All other components within normal limits  CBC - Abnormal; Notable for the following components:   RBC 3.13 (*)    Hemoglobin 9.8 (*)    HCT 28.3 (*)    RDW 16.7 (*)    All  other components within normal limits  PHOSPHORUS - Abnormal; Notable for the following components:   Phosphorus 6.0 (*)    All other components within normal limits  MAGNESIUM - Abnormal; Notable for the following components:   Magnesium 0.9 (*)    All other components within normal limits  ALBUMIN - Abnormal; Notable for the following components:   Albumin 3.0 (*)    All other components within normal limits  CBG MONITORING, ED - Abnormal; Notable for the following components:   Glucose-Capillary 114 (*)    All other components  within normal limits  C DIFFICILE QUICK SCREEN W PCR REFLEX    GASTROINTESTINAL PANEL BY PCR, STOOL (REPLACES STOOL CULTURE)  MRSA NEXT GEN BY PCR, NASAL  URINALYSIS, ROUTINE W REFLEX MICROSCOPIC  HEPATIC FUNCTION PANEL  COMPREHENSIVE METABOLIC PANEL  MAGNESIUM  SODIUM, URINE, RANDOM  RENAL FUNCTION PANEL    EKG None  Radiology No results found.  Procedures .Critical Care  Performed by: Kateri Plummer, PA-C Authorized by: Kateri Plummer, PA-C   Critical care provider statement:    Critical care time (minutes):  30   Critical care time was exclusive of:  Separately billable procedures and treating other patients   Critical care was necessary to treat or prevent imminent or life-threatening deterioration of the following conditions:  Renal failure   Critical care was time spent personally by me on the following activities:  Development of treatment plan with patient or surrogate, discussions with consultants, evaluation of patient's response to treatment, examination of patient, ordering and review of laboratory studies, ordering and review of radiographic studies, ordering and performing treatments and interventions, pulse oximetry, re-evaluation of patient's condition and review of old charts   Care discussed with: admitting provider       Medications Ordered in ED Medications  calcium carbonate (OS-CAL - dosed in mg of elemental  calcium) tablet 2,500 mg (has no administration in time range)  magnesium sulfate IVPB 4 g 100 mL (4 g Intravenous New Bag/Given 06/23/22 1608)  calcium gluconate 1 g/ 50 mL sodium chloride IVPB (has no administration in time range)  apixaban (ELIQUIS) tablet 2.5 mg (has no administration in time range)  potassium chloride SA (KLOR-CON M) CR tablet 20 mEq (has no administration in time range)  pravastatin (PRAVACHOL) tablet 10 mg (has no administration in time range)  Chlorhexidine Gluconate Cloth 2 % PADS 6 each (has no administration in time range)  sodium bicarbonate tablet 650 mg (has no administration in time range)  potassium chloride 10 mEq in 100 mL IVPB (has no administration in time range)  polyvinyl alcohol (LIQUIFILM TEARS) 1.4 % ophthalmic solution 1 drop (has no administration in time range)  sodium chloride 0.9 % bolus 1,000 mL (0 mLs Intravenous Stopped 06/23/22 1705)  calcium gluconate 1 g/ 50 mL sodium chloride IVPB (0 mg Intravenous Stopped 06/23/22 1705)  potassium chloride SA (KLOR-CON M) CR tablet 20 mEq (20 mEq Oral Given 06/23/22 1615)  sodium chloride 0.9 % bolus 1,000 mL (0 mLs Intravenous Stopped 06/23/22 1705)    ED Course/ Medical Decision Making/ A&P                           Medical Decision Making Amount and/or Complexity of Data Reviewed Labs: ordered.  Risk OTC drugs. Prescription drug management. Decision regarding hospitalization.  This patient is a 86 y.o. male who presents to the ED after abnormal labs from cardiologist.   Past Medical History / Co-morbidities / Social History: CAD, atrial fibrillation on Xarelto, diabetes, peripheral neuropathy, hypertension, peripheral vascular disease, squamous cell jaw cancer, s/p left BKA  Additional history: Chart reviewed. Pertinent results include: Outpatient labs significant for potassium 2.4, calcium 4.3, creatinine 4.28 (compared to 1.36 one month ago).   Physical Exam: Physical exam performed. The pertinent  findings include: Patient appears pale and tired, but otherwise clinically well.  Currently in atrial fibrillation and with rates around 70 bpm.  No peripheral edema.  Lab Tests: I ordered, and personally interpreted labs.  The pertinent  results include: Creatinine 4.43.  Potassium 2.7.  Calcium 4.3.  Magnesium 0.9.  Albumin 3.0.   Cardiac Monitoring:  The patient was maintained on a cardiac monitor.  My attending physician viewed and interpreted the cardiac monitor which was significant for atrial fibrillation.  I agree with this interpretation.   Medications: I ordered medication including the fluids and electrolyte replacement for acute renal failure and numerous electrolyte abnormalities. I have reviewed the patients home medicines and have made adjustments as needed.  Consultations Obtained: I requested consultation with the hospitalist Dr. [facility],  and discussed lab and imaging findings as well as pertinent plan - they recommend: Admission for electrolyte replacement   Disposition: After consideration of the diagnostic results and the patients response to treatment, I feel that patient is requiring medical admission for acute on chronic renal failure, hypokalemia, hypocalcemia, hypomagnesemia.  I discussed this case with my attending physician Dr. Langston Masker who cosigned this note including patient's presenting symptoms, physical exam, and planned diagnostics and interventions. Attending physician stated agreement with plan or made changes to plan which were implemented.           Final Clinical Impression(s) / ED Diagnoses Final diagnoses:  Acute renal failure superimposed on chronic kidney disease, unspecified CKD stage, unspecified acute renal failure type ([place])  Hypokalemia  Hypomagnesemia  Hypocalcemia    Rx / DC Orders ED Discharge Orders     None      Portions of this report may have been transcribed using voice recognition software. Every effort was made to ensure  accuracy; however, inadvertent computerized transcription errors may be present.    Estill Cotta 06/23/22 1715    Cristie Hem, MD 06/23/22 2243

## 2022-06-23 NOTE — ED Triage Notes (Signed)
Pt was sent over from PCP due to abnormal creatinine level. Pt has been feeling fatigued and weak lately.

## 2022-06-23 NOTE — Telephone Encounter (Signed)
Patient's wife is calling stating her son is an Therapist, sports and wrote her some notes regarding the patient to report to his cardiologist. The notes read the patient has been having numbness in his face and right foot after taking eliquis. He has also been having SOB for the past 2-3 weeks with little to no exertion. Patient is not eating well and states he is not hungry of thirsty, so he has been encouraged to drink boost. Braylen has not taken eliquis the past few nights and did not take his morning dose of eliquis on 08/31. Patient did not complain of face numbness the day of 08/31. When the patient took his evening dose of eliquis on 08/31 at 9:30 pm he began complaining of a little face numbness again. His EKG was checked through his apple watch and Afib was reported at 6:45 pm with no facial numbness at that time. Please advise.

## 2022-06-23 NOTE — H&P (Signed)
TRH H&P   Patient Demographics:    [name], is a 86 y.o. male  MRN: [id number]   DOB - November 26, 1935  Admit Date - 06/23/2022  Outpatient Primary MD for the patient is Lemmie Evens, MD  Referring MD/NP/PA: PA Roemhildt  Outpatient Specialists: vascular Dr Scot Dock,   cards Dr Gillian Shields  Patient coming from: home   Chief Complaint  Patient presents with   Weakness      HPI:    [name]  is a 86 y.o. male, with past medical history of A-fib, on Eliquis, recurrent GI bleed, hypertension, hyperlipidemia, history of CAD status post CABG, peripheral vascular disease status post left AKA, emesis Silgel cancer. -Patient presents to ED due to abnormal labs, patient report weakness, remittent dizziness, lightheadedness, nausea, and poor fluid intake, he recently came back from vacation at [facility], where family reported they had a lot of activities they were busy through the 4 days, patient as well with chronic diarrhea for last few month, and this seems Imodium has recently stopped to help, call cardiology office, where labs has been obtained, was significant for multiple abnormalities including calcium 4.3, creatinine 4.3, so they were instructed to stop losartan/hydrochlorothiazide and to come to ED - in ED blood pressure has been soft initially with systolic in the 87O, it did respond to fluids, EKG showing bigeminy, prolonged QTc with LBBB, potassium was low at 2.7, phosphorous elevated at 6, magnesium low at 0.9, calcium low at 4.3, creatinine elevated at 4.3, no evidence of urinary retention, BUN elevated at 74, bicarb low at 12, and hospitalist consulted to admit    Review of systems:      A full 10 point Review of Systems was done, except as stated above, all other Review of Systems were negative.   With Past History of the following :    Past Medical History:   Diagnosis Date   Arteriosclerotic cardiovascular disease (ASCVD)    CABG in 1990s; 2002 total obstruction of LAD, CX and RCA with patent grafts and nl EF; Stress nuc. 2008 - mild LV dilation; normal EF; questionable small anteroapical scar; no ischemia   Arthritis    "left knee" (11/08/2015)   Atrial fibrillation ([place])    Cellulitis 11/08/2015   "both feet"   Chronic anticoagulation    Dental crowns present    Diabetic peripheral neuropathy ([place])    bilateral lower leg, hands   History of blood transfusion    "related to my leg surgery?"   Hypertension    states under control with meds., has been on med. x "long time"   Iron deficiency anemia    Jaw cancer ([place]) 1990s   "squamous cell"   Myocardial infarction ([place]) 1990s   "after my jaw OR"   Pedal edema    "not a lot", per pt.   Peripheral vascular disease (HCC)    Presence  of retained hardware 07/2015   failed hardware jaw   Runny nose 07/27/2015   clear drainage, per pt.   Stroke ([place])    Type II diabetes mellitus ([place])    "diet controlled" (11/08/2015)      Past Surgical History:  Procedure Laterality Date   ABDOMINAL AORTOGRAM W/LOWER EXTREMITY Bilateral 08/20/2020   Procedure: ABDOMINAL AORTOGRAM W/LOWER EXTREMITY;  Surgeon: Angelia Mould, MD;  Location: [facility];  Service: Cardiovascular;  Laterality: Bilateral;   AMPUTATION Left 11/11/2015   Procedure: LEFT ABOVE KNEE AMPUTATION;  Surgeon: Angelia Mould, MD;  Location: [employer];  Service: Vascular;  Laterality: Left;   CARDIAC CATHETERIZATION  1990s; 04/16/2001   CATARACT EXTRACTION W/ INTRAOCULAR LENS  IMPLANT, BILATERAL Bilateral 09/2015   COLONOSCOPY N/A 11/27/2014   Procedure: COLONOSCOPY;  Surgeon: Rogene Houston, MD;  [facility];  Service: Endoscopy;  Laterality: N/A;  830   CORONARY ARTERY BYPASS GRAFT  1990's   "CABG X4"   ESOPHAGOGASTRODUODENOSCOPY N/A 02/14/2018   Procedure: ESOPHAGOGASTRODUODENOSCOPY (EGD);  Surgeon:  Rogene Houston, MD;  [facility];  Service: Endoscopy;  Laterality: N/A;   ESOPHAGOGASTRODUODENOSCOPY (EGD) WITH PROPOFOL N/A 02/25/2018   Procedure: ESOPHAGOGASTRODUODENOSCOPY (EGD) WITH PROPOFOL;  Surgeon: Rogene Houston, MD;  [facility];  Service: Endoscopy;  Laterality: N/A;   FRACTURE SURGERY     GIVENS CAPSULE STUDY N/A 02/15/2018   Procedure: GIVENS CAPSULE STUDY;  Surgeon: Rogene Houston, MD;  [facility];  Service: Endoscopy;  Laterality: N/A;   GIVENS CAPSULE STUDY N/A 03/27/2018   Procedure: GIVENS CAPSULE STUDY;  Surgeon: Rogene Houston, MD;  [facility];  Service: Endoscopy;  Laterality: N/A;   I & D EXTREMITY Left 11/10/2015   Procedure: INCISION AND DRAINAGE LEFT FOOT, AMPUTATION OF LEFT THIRD TOE;  Surgeon: Conrad [place] Farms, MD;  Location: [place];  Service: Vascular;  Laterality: Left;   INCISION AND DRAINAGE ABSCESS Left 06/16/2005   wide exc. abscess 5th toe   IR ANGIOGRAM VISCERAL SELECTIVE  03/28/2018   IR ANGIOGRAM VISCERAL SELECTIVE  03/28/2018   IR CT HEAD LTD  01/04/2019   IR PERCUTANEOUS ART THROMBECTOMY/INFUSION INTRACRANIAL INC DIAG ANGIO  01/04/2019   IR US GUIDE VASC ACCESS LEFT  03/28/2018   MANDIBULAR HARDWARE REMOVAL Left 08/02/2015   Procedure:  HARDWARE REMOVAL TWO MANDIBULAR SCREWS;  Surgeon: Jannette Fogo, DDS;  Location: [facility];  Service: Oral Surgery;  Laterality: Left;   ORIF TIBIA FRACTURE Left ~ [place] N/A 08/14/2016   Procedure: Abdominal Aortogram w/Lower Extremity;  Surgeon: Angelia Mould, MD;  Location: [facility];  Service: Cardiovascular;  Laterality: N/A;   PERIPHERAL VASCULAR INTERVENTION Bilateral 08/20/2020   Procedure: PERIPHERAL VASCULAR INTERVENTION;  Surgeon: Angelia Mould, MD;  Location: [facility];  Service: Cardiovascular;  Laterality: Bilateral;  iliac stents   RADIOLOGY WITH ANESTHESIA N/A 01/04/2019   Procedure:  CODE STROKE;  Surgeon: Radiologist, Medication, MD;  Location: [facility];  Service: Radiology;  Laterality: N/A;   SQUAMOUS CELL CARCINOMA EXCISION Left 1993   "took my jaw out; got cadavar in there now"   TONSILLECTOMY        Social History:     Social History   Tobacco Use   Smoking status: Never   Smokeless tobacco: Never  Substance Use Topics   Alcohol use: No    Alcohol/week: 0.0 standard drinks of alcohol  Lives - at home  Mobility -wheelchair or electric scooter     Family History :     Family History  Problem Relation Age of Onset   Heart disease Mother       Home Medications:   Prior to Admission medications   Medication Sig Start Date End Date Taking? Authorizing Provider  acetaminophen (TYLENOL) 325 MG tablet Take 2 tablets (650 mg total) by mouth every 6 (six) hours as needed for moderate pain. 11/27/21   Jaynee Eagles, PA-C  amLODipine (NORVASC) 10 MG tablet TAKE 1 TABLET DAILY 08/01/21   Satira Sark, MD  carboxymethylcellul-glycerin (OPTIVE) 0.5-0.9 % ophthalmic solution Place 1 drop into both eyes 3 (three) times daily as needed for dry eyes.    [provider]  cetirizine (ZYRTEC) 10 MG tablet Take 10 mg by mouth daily.    [provider]  ELIQUIS 5 MG TABS tablet TAKE 1 TABLET TWICE A DAY 11/21/21   Jerline Pain, MD  feeding supplement (BOOST / RESOURCE BREEZE) LIQD Take 1 Container by mouth 2 (two) times daily. Patient taking differently: Take 237 mLs by mouth 2 (two) times daily. 01/08/19   Donzetta Starch, NP  halobetasol (ULTRAVATE) 0.05 % cream Apply topically. 11/18/20   [provider]  lovastatin (MEVACOR) 10 MG tablet Take 10 mg by mouth daily.     [provider]  nebivolol (BYSTOLIC) 10 MG tablet Take 10 mg by mouth daily.    [provider]  nystatin cream (MYCOSTATIN) Apply 1 application topically 3 (three) times daily as needed (skin irritation/rash.).     [provider]  Omega-3  Fatty Acids (FISH OIL) 1200 MG CAPS Take 1,200 mg by mouth daily.    [provider]  Pediatric Multivitamins-Iron (FLINTSTONES PLUS IRON PO) Take 1 tablet by mouth daily.    [provider]  Probiotic Product ([place]) Take 1 capsule by mouth daily.     [provider]  silver sulfADIAZINE (SILVADENE) 1 % cream Apply 1 application topically daily. 01/08/19   Donzetta Starch, NP     Allergies:     Allergies  Allergen Reactions   Lipitor [Atorvastatin] Other (See Comments)    SEVERE HEADACHE   Simvastatin Other (See Comments)    MUSCLE ACHES   Penicillins Rash    Has patient had a PCN reaction causing immediate rash, facial/tongue/throat swelling, SOB or lightheadedness with hypotension: Yes Has patient had a PCN reaction causing severe rash involving mucus membranes or skin necrosis: No Has patient had a PCN reaction that required hospitalization No Has patient had a PCN reaction occurring within the last 10 years: No If all of the above answers are "NO", then may proceed with Cephalosporin use.    Sulfa Antibiotics Rash    Tolerates silver sulfadiazine cream at home     Physical Exam:   Vitals  Blood pressure 103/64, pulse 63, temperature (!) 97.5 F (36.4 C), temperature source Oral, resp. rate 18, height 5' 3"  (1.6 m), weight 68 kg, SpO2 100 %.   1. General pale, elderly male, chronically appearing, laying in bed in no apparent distress  2. Normal affect and insight, Not Suicidal or Homicidal, Awake Alert, Oriented X 3.  3. No F.N deficits, ALL C.Nerves Intact, Strength 5/5 all 4 extremities, Sensation intact all 4 extremities, Plantars down going.  4. Ears and Eyes appear Normal, Conjunctivae clear, PERRLA. Moist Oral Mucosa.  5. Supple Neck, No JVD, No cervical lymphadenopathy  appriciated, No Carotid Bruits.  6. Symmetrical Chest wall movement, Good air movement bilaterally, CTAB.  7. RRR, No Gallops, Rubs or Murmurs, No  Parasternal Heave.  8. Positive Bowel Sounds, Abdomen Soft, No tenderness, No organomegaly appriciated,No rebound -guarding or rigidity.  9.  No Cyanosis, Normal Skin Turgor, No Skin Rash or Bruise.  10.  Left AKA, right lower extremity, clubbing, or cyanosis, but overall appears with skin changes related to chronic ischemic disease     Data Review:    CBC Recent Labs  Lab 06/23/22 1342  WBC 10.0  HGB 9.8*  HCT 28.3*  PLT 353  MCV 90.4  MCH 31.3  MCHC 34.6  RDW 16.7*   ------------------------------------------------------------------------------------------------------------------  Chemistries  Recent Labs  Lab 06/23/22 1131 06/23/22 1342 06/23/22 1436  NA 140 139  --   K 2.4* 2.7*  --   CL 118* 118*  --   CO2 9* 12*  --   GLUCOSE 142* 137*  --   BUN 71* 74*  --   CREATININE 4.28* 4.43*  --   CALCIUM 4.3* 4.3*  --   MG  --   --  0.9*   ------------------------------------------------------------------------------------------------------------------ estimated creatinine clearance is 10.2 mL/min (A) (by C-G formula based on SCr of 4.43 mg/dL (H)). ------------------------------------------------------------------------------------------------------------------ No results for input(s): "TSH", "T4TOTAL", "T3FREE", "THYROIDAB" in the last 72 hours.  Invalid input(s): "FREET3"  Coagulation profile No results for input(s): "INR", "PROTIME" in the last 168 hours. ------------------------------------------------------------------------------------------------------------------- No results for input(s): "DDIMER" in the last 72 hours. -------------------------------------------------------------------------------------------------------------------  Cardiac Enzymes No results for input(s): "CKMB", "TROPONINI", "MYOGLOBIN" in the last 168 hours.  Invalid input(s):  "CK" ------------------------------------------------------------------------------------------------------------------    Component Value Date/Time   BNP 160.0 (H) 12/01/2015 0700     ---------------------------------------------------------------------------------------------------------------  Urinalysis    Component Value Date/Time   COLORURINE YELLOW 01/04/2019 2322   APPEARANCEUR HAZY (A) 01/04/2019 2322   LABSPEC 1.043 (H) 01/04/2019 2322   PHURINE 5.0 01/04/2019 2322   GLUCOSEU NEGATIVE 01/04/2019 2322   HGBUR NEGATIVE 01/04/2019 2322   BILIRUBINUR NEGATIVE 01/04/2019 2322   KETONESUR NEGATIVE 01/04/2019 2322   PROTEINUR NEGATIVE 01/04/2019 2322   NITRITE POSITIVE (A) 01/04/2019 2322   LEUKOCYTESUR LARGE (A) 01/04/2019 2322    ----------------------------------------------------------------------------------------------------------------   Imaging Results:    No results found.  My personal review of EKG: A-fib, bigeminy, 64 bpm, QTc of 596.  Left bundle branch block    Assessment & Plan:    Principal Problem:   AKI (acute kidney injury) ([place]) Active Problems:   Diabetes mellitus ([place])   Dyslipidemia   Essential hypertension   Permanent atrial fibrillation (HCC)   Hx of CABG   Chronic anticoagulation   Peripheral vascular disease (HCC)   Status post above-knee amputation of left lower extremity (HCC)   Acute renal failure superimposed on stage 3 chronic kidney disease (HCC)   Hypokalemia   Hypomagnesemia   Hypocalcemia   Hyperphosphatemia    AKI on CKD stage III AA -Patient with baseline creatinine 1.5, and is 4.4, appears mildly prerenal given significantly elevated BUN, and low blood pressure, in the setting of dehydration due to poor oral intake and diarrhea. -Will check bladder scan.  So far no evidence of obstructive uropathy -Will check urine sodium -Will check renal ultrasound if no improvement -Hold nephrotoxic medication and continue with  aggressive IV hydration -We will start on oral sodium bicarb.  Severe hypokalemia -2.4 on admission, aggressively repleting, will recheck this evening  Hypomagnesemia -Frequently low at 0.9, repleting,  will recheck this evening  Hypocalcemia -We will check albumin to adjust calcium to albumin ratio, but ionized calcium remains significantly low so we will start on IV supplements  Hyperphosphatemia -Due to renal failure, recheck in a.m. after hydration.  Diarrhea -This is peers to be chronic over last few months, will check GI panel and C. Difficile-patient is following Dr. Melony Overly as an outpatient, I have discussed with patient/family that he will need to follow-up with GI as an outpatient regarding that -We will resume on Imodium once his electrolyte depleted, and may need a stronger agent like Lomotil if needed.  Hypertension -Soft blood pressure keep holding meds  Chronic A-fib -Heart rate is controlled, low blood pressure, continue to hold beta-blockers -Resume Eliquis at a lower dose once corrected to his age and creatinine at 2.5 mg p.o. twice daily.  For lipidemia -Resume statin once stable, LFTs has been obtained and appears to be stable  PVD -Patient is following with Dr. Doren Custard, status post right AKA, known left lower extremity PVD, but does not appear to be having any critical limb ischemia.  Continue with Eliquis, resume statin once LFTs obtained and normal.  prolonged QTc -related to severe electrolyte derangement, monitor and telemetry and avoid prolonging agents  DVT Prophylaxis Eliquis  AM Labs Ordered, also please review Full Orders  Family Communication: Admission, patients condition and plan of care including tests being ordered have been discussed with the patient and wife and daughter who indicate understanding and agree with the plan and Code Status.  Code Status full  Likely DC to  home  Condition GUARDED    Consults called: none    Admission  status: inpatient    Time spent in minutes : 75 minutes   Phillips Climes M.D on 06/23/2022 at [place] PM   [facility] - Office  [phone]

## 2022-06-23 NOTE — Telephone Encounter (Signed)
Spoke to pt's spouse who verbalized understanding and will take pt to ED for evaluation today. Pt's spouse had no further questions or concerns.

## 2022-06-23 NOTE — Telephone Encounter (Signed)
Recommend wife take patient to ER for assessment

## 2022-06-23 NOTE — Telephone Encounter (Signed)
Critical labs resulted, reviewed by B.Strader, PA-C, recommend urgent ED evaluation. Both patients wife and daughter are aware. Will go to [facility]

## 2022-06-23 NOTE — Telephone Encounter (Signed)
     By review of prior notes, family numbers called this morning reporting he was having numbness along his face. Suspect this is secondary to his critically low calcium of 4.3. While he has chronic hypocalcemia, this has previously been around 6.7 to 8.3.  Given this along with his symptoms, would recommend he go to the Emergency Department for further evaluation. Also has low potassium of 2.4 and an acute kidney injury with creatinine going from 1.36 to 4.28. Stop Losartan-HCTZ and again would recommend Emergency Department evaluation today given his multiple lab abnormalities and associated symptoms.   Signed, Erma Heritage, PA-C 06/23/2022, 12:16 PM Pager: [phone]

## 2022-06-23 NOTE — Telephone Encounter (Signed)
This call needs review by Dr.Skains

## 2022-06-23 NOTE — Telephone Encounter (Signed)
Crystal with the lab at [place] with critical labs.

## 2022-06-24 ENCOUNTER — Encounter (HOSPITAL_COMMUNITY): Payer: Self-pay | Admitting: Internal Medicine

## 2022-06-24 DIAGNOSIS — E785 Hyperlipidemia, unspecified: Secondary | ICD-10-CM

## 2022-06-24 DIAGNOSIS — E1149 Type 2 diabetes mellitus with other diabetic neurological complication: Secondary | ICD-10-CM

## 2022-06-24 DIAGNOSIS — Z951 Presence of aortocoronary bypass graft: Secondary | ICD-10-CM

## 2022-06-24 DIAGNOSIS — N189 Chronic kidney disease, unspecified: Secondary | ICD-10-CM

## 2022-06-24 DIAGNOSIS — Z7901 Long term (current) use of anticoagulants: Secondary | ICD-10-CM | POA: Diagnosis not present

## 2022-06-24 DIAGNOSIS — I4821 Permanent atrial fibrillation: Secondary | ICD-10-CM

## 2022-06-24 DIAGNOSIS — N179 Acute kidney failure, unspecified: Secondary | ICD-10-CM | POA: Diagnosis not present

## 2022-06-24 DIAGNOSIS — I739 Peripheral vascular disease, unspecified: Secondary | ICD-10-CM

## 2022-06-24 LAB — CBC
HCT: 29.6 % — ABNORMAL LOW (ref 39.0–52.0)
Hemoglobin: 10.2 g/dL — ABNORMAL LOW (ref 13.0–17.0)
MCH: 31.3 pg (ref 26.0–34.0)
MCHC: 34.5 g/dL (ref 30.0–36.0)
MCV: 90.8 fL (ref 80.0–100.0)
Platelets: 355 10*3/uL (ref 150–400)
RBC: 3.26 MIL/uL — ABNORMAL LOW (ref 4.22–5.81)
RDW: 16.4 % — ABNORMAL HIGH (ref 11.5–15.5)
WBC: 9.5 10*3/uL (ref 4.0–10.5)
nRBC: 0 % (ref 0.0–0.2)

## 2022-06-24 LAB — RENAL FUNCTION PANEL
Albumin: 2.9 g/dL — ABNORMAL LOW (ref 3.5–5.0)
Anion gap: 11 (ref 5–15)
BUN: 64 mg/dL — ABNORMAL HIGH (ref 8–23)
CO2: 10 mmol/L — ABNORMAL LOW (ref 22–32)
Calcium: 5.7 mg/dL — CL (ref 8.9–10.3)
Chloride: 120 mmol/L — ABNORMAL HIGH (ref 98–111)
Creatinine, Ser: 4.08 mg/dL — ABNORMAL HIGH (ref 0.61–1.24)
GFR, Estimated: 14 mL/min — ABNORMAL LOW (ref >60)
Glucose, Bld: 118 mg/dL — ABNORMAL HIGH (ref 70–99)
Phosphorus: 4.6 mg/dL (ref 2.5–4.6)
Potassium: 3.1 mmol/L — ABNORMAL LOW (ref 3.5–5.1)
Sodium: 141 mmol/L (ref 135–145)

## 2022-06-24 LAB — MRSA NEXT GEN BY PCR, NASAL: MRSA by PCR Next Gen: DETECTED — AB

## 2022-06-24 LAB — GLUCOSE, CAPILLARY
Glucose-Capillary: 101 mg/dL — ABNORMAL HIGH (ref 70–99)
Glucose-Capillary: 183 mg/dL — ABNORMAL HIGH (ref 70–99)
Glucose-Capillary: 135 mg/dL — ABNORMAL HIGH (ref 70–99)
Glucose-Capillary: 122 mg/dL — ABNORMAL HIGH (ref 70–99)

## 2022-06-24 MED ORDER — MUPIROCIN 2 % EX OINT
1.0000 | TOPICAL_OINTMENT | Freq: Two times a day (BID) | CUTANEOUS | Status: DC
  Administered 2022-06-24 – 2022-06-27 (×7): 1 via NASAL
  Filled 2022-06-24 (×2): qty 22

## 2022-06-24 MED ORDER — CALCIUM GLUCONATE-NACL 1-0.675 GM/50ML-% IV SOLN
1.0000 g | Freq: Once | INTRAVENOUS | Status: AC
Start: 2022-06-24 — End: 2022-06-24
  Administered 2022-06-24: 1000 mg via INTRAVENOUS
  Filled 2022-06-24: qty 50

## 2022-06-24 MED ORDER — CALCIUM GLUCONATE-NACL 1-0.675 GM/50ML-% IV SOLN
1.0000 g | Freq: Once | INTRAVENOUS | Status: AC
  Administered 2022-06-24: 1000 mg via INTRAVENOUS
  Filled 2022-06-24: qty 50

## 2022-06-24 MED ORDER — LACTATED RINGERS IV SOLN: INTRAVENOUS | Status: AC

## 2022-06-24 MED ORDER — POTASSIUM CHLORIDE CRYS ER 20 MEQ PO TBCR
30.0000 meq | EXTENDED_RELEASE_TABLET | Freq: Every day | ORAL | Status: DC
  Administered 2022-06-24: 30 meq via ORAL
  Filled 2022-06-24: qty 1

## 2022-06-24 MED ORDER — CALCIUM CARBONATE 1250 (500 CA) MG PO TABS
1250.0000 mg | ORAL_TABLET | Freq: Every day | ORAL | Status: DC
  Administered 2022-06-24 – 2022-06-27 (×4): 1250 mg via ORAL
  Filled 2022-06-24 (×4): qty 1

## 2022-06-24 MED ORDER — SODIUM BICARBONATE 650 MG PO TABS
1300.0000 mg | ORAL_TABLET | Freq: Three times a day (TID) | ORAL | Status: DC
  Administered 2022-06-24 – 2022-06-27 (×8): 1300 mg via ORAL
  Filled 2022-06-24 (×8): qty 2

## 2022-06-24 MED ORDER — CHLORHEXIDINE GLUCONATE CLOTH 2 % EX PADS
6.0000 | MEDICATED_PAD | Freq: Every day | CUTANEOUS | Status: AC
  Administered 2022-06-24 – 2022-06-27 (×4): 6 via TOPICAL

## 2022-06-24 MED ORDER — CALCIUM GLUCONATE-NACL 2-0.675 GM/100ML-% IV SOLN
2.0000 g | Freq: Once | INTRAVENOUS | Status: DC
  Filled 2022-06-24: qty 100

## 2022-06-24 MED ORDER — CALCIUM GLUCONATE-NACL 1-0.675 GM/50ML-% IV SOLN
1.0000 g | Freq: Once | INTRAVENOUS | Status: DC
Start: 2022-06-24 — End: 2022-06-24

## 2022-06-24 NOTE — Assessment & Plan Note (Signed)
-  Patient denies chest pain or shortness of breath -No acute ischemic abnormalities appreciated on EKG or telemetry. -Continue outpatient follow-up with cardiology -At discharge continue the use of Bystolic. -Continue outpatient follow-up with cardiology service.

## 2022-06-24 NOTE — Assessment & Plan Note (Signed)
-  In the setting of renal failure. -Continue to follow electrolytes trend. -Continue to maintain adequate hydration.

## 2022-06-24 NOTE — Assessment & Plan Note (Signed)
-  Most likely associated with chronic use of diuretics and ongoing GI losses. -Repleted and stabilized; continue daily supplementation at discharge.

## 2022-06-24 NOTE — Progress Notes (Signed)
Date and time results received: 06/24/22 0436 (use smartphrase ".now" to insert current time)  Test: Calcium Critical Value: 5.7   Name of Provider Notified: Zierle-Ghosh  Orders Received? Or Actions Taken?: awaiting new orders

## 2022-06-24 NOTE — Assessment & Plan Note (Addendum)
-  No signs of overt bleeding appreciated currently. -Continue close monitoring of patient's platelet count and hemoglobin trend.

## 2022-06-24 NOTE — Assessment & Plan Note (Signed)
-  Rate controlled -Continue Eliquis for secondary prevention; dose has been adjusted to renal function. -Continue to replete electrolytes and continue telemetry monitoring.

## 2022-06-24 NOTE — Assessment & Plan Note (Signed)
-  Status post left AKA -Continue outpatient follow-up with vascular surgery.

## 2022-06-24 NOTE — Progress Notes (Signed)
Progress Note   Patient: [name] KCL:[id number] DOB: [birth year] DOA: 06/23/2022     1 DOS: the patient was seen and examined on 06/24/2022   Brief hospital course: As per H&P written by Dr. Waldron [facility] on 06/23/2022 [name]  is a 86 y.o. male, with past medical history of A-fib, on Eliquis, recurrent GI bleed, hypertension, hyperlipidemia, history of CAD status post CABG, peripheral vascular disease status post left AKA, emesis Silgel cancer. -Patient presents to ED due to abnormal labs, patient report weakness, remittent dizziness, lightheadedness, nausea, and poor fluid intake, he recently came back from vacation at [facility], where family reported they had a lot of activities they were busy through the 4 days, patient as well with chronic diarrhea for last few month, and this seems Imodium has recently stopped to help, call cardiology office, where labs has been obtained, was significant for multiple abnormalities including calcium 4.3, creatinine 4.3, so they were instructed to stop losartan/hydrochlorothiazide and to come to ED - in ED blood pressure has been soft initially with systolic in the 49S, it did respond to fluids, EKG showing bigeminy, prolonged QTc with LBBB, potassium was low at 2.7, phosphorous elevated at 6, magnesium low at 0.9, calcium low at 4.3, creatinine elevated at 4.3, no evidence of urinary retention, BUN elevated at 74, bicarb low at 12, and hospitalist consulted to admit  Assessment and Plan: * AKI (acute kidney injury) ([place]) - Acute kidney injury on chronic kidney disease a stage III A as per chart review. -Appears to be secondary to prerenal azotemia from decreased oral intake and significant GI losses.  Patient also continue the use of nephrotoxic agents (lisinopril/HCTZ) throughout this time. -Continue fluid resuscitation -Replete electrolytes, continue sodium bicarbonate and follow renal function trend. -Patient's calcium, potassium and bicarb still out  of wack.  Hyperphosphatemia - In the setting of renal failure -Continue to follow electrolytes trend. -Continue to maintain adequate hydration.  Hypocalcemia - Most likely associated with chronic use of diuretics and ongoing GI losses. -Continue electrolyte repletion -Follow trend.  Hypomagnesemia - In the setting of GI losses -Continue to follow trend and further replete as needed.  Hypokalemia - In the setting of GI losses -Continue to follow electrolytes trend and further replete as needed; currently providing daily supplementation.  Peripheral vascular disease ([place]) - Status post left AKA -Continue outpatient follow-up with vascular surgery.  Chronic anticoagulation - No signs of overt bleeding appreciated currently. -Continue close monitoring of patient's platelet count and hemoglobin trend.  Hx of CABG - Patient denies chest pain or shortness of breath -Continue telemetry monitoring -Outpatient follow-up with cardiology -Once renal function recovers will resume home cardiac regimen.  Permanent atrial fibrillation (HCC) - Rate controlled -Continue Eliquis for secondary prevention; dose has been adjusted to renal function. -Replete electrolytes and continue telemetry monitoring.  Essential hypertension - Overall stable -Continue holding antihypertensive agents in the setting of acute on chronic renal failure -Maintain adequate hydration -Follow-up vital signs.  Dyslipidemia - Continue the use of a statin at discharge -Heart healthy diet discussed with patient.  Diabetes mellitus ([place]) - With nephropathy -As an outpatient-most recent A1c in the 6 range -Continue to follow CBGs. -Continue diet control.   Subjective:  Reporting decreased in the amount of bowel movements that he was having prior to come to the hospital; no chest pain, no nausea, no vomiting, no abdominal pain.  Feeling better after fluid resuscitation initiated.  Still can of weak.  Physical  Exam: Vitals:  06/24/22 1200 06/24/22 1300 06/24/22 1400 06/24/22 1637  BP: (!) 146/55 (!) 144/72 (!) 140/107   Pulse: 61 65 76   Resp: 16 18 (!) 26   Temp:    97.8 F (36.6 C)  TempSrc:    Oral  SpO2: 100% 100% 100%   Weight:      Height:       General exam: Alert, awake, oriented x 3, reports feeling better after fluid resuscitation initiated; no chest pain, no nausea, no vomiting, no shortness of breath.  Reports diarrhea has significantly slowed down since his arrival to the hospital. Respiratory system: Clear to auscultation. Respiratory effort normal.  No using accessory muscle.  Good saturation on room air. Cardiovascular system: Rate controlled, no rubs, no gallops, no JVD. Gastrointestinal system: Abdomen is nondistended, soft and nontender. No organomegaly or masses felt. Normal bowel sounds heard. Central nervous system: Alert and oriented. No focal neurological deficits. Extremities: No cyanosis or clubbing; left AKA.   Skin: No petechiae. Psychiatry: Judgement and insight appear normal. Mood & affect appropriate.   Data Reviewed: Renal function panel: Sodium 141, potassium 3.1, chloride 120, bicarb 10, glucose 118, BUN 64, creatinine 4.08, calcium 5.7; albumin 2.9 and GFR 14. CBC: WBCs 9.5, hemoglobin 10.2 and platelet count 355 K CBG: 135-180 range  Family Communication: Wife at bedside.  Disposition: Status is: Inpatient Remains inpatient appropriate because: Still requiring IV medication and electrolyte repletion.   Planned Discharge Destination: Home  Author: Barton Dubois, MD 06/24/2022 6:14 PM  For on call review [email or web address].

## 2022-06-24 NOTE — Assessment & Plan Note (Signed)
-  In the setting of GI losses -Continue to follow electrolytes trend and further replete as needed; currently providing daily supplementation.

## 2022-06-24 NOTE — Assessment & Plan Note (Signed)
-  In the setting of GI losses -Continue to follow trend and further replete as needed.

## 2022-06-24 NOTE — Assessment & Plan Note (Signed)
-  With nephropathy -As an outpatient-most recent A1c in the 6 range -Continue to follow CBGs. -Continue diet control.

## 2022-06-24 NOTE — Assessment & Plan Note (Addendum)
-  Acute kidney injury on chronic kidney disease stage III A as per chart review. -Appears to be secondary to prerenal azotemia from decreased oral intake and significant GI losses.  Patient also continue the use of nephrotoxic agents (lisinopril/HCTZ) throughout that time. -HCTZ and lisinopril has been discontinued at discharge -Patient advised to follow heart healthy diet and to maintain adequate hydration -Electrolytes has been repleted and pretty close to normal range with instructions and prescription to continue daily supplementation. -Continue to follow electrolytes and renal function and stability with repeat blood work at follow-up visit.

## 2022-06-24 NOTE — Evaluation (Signed)
Physical Therapy Evaluation Patient Details Name: [name] MRN: [id number] DOB: 1936-04-28 Today's Date: 06/24/2022  History of Present Illness  [name]  is a 86 y.o. male, with past medical history of A-fib, on Eliquis, recurrent GI bleed, hypertension, hyperlipidemia, history of CAD status post CABG, peripheral vascular disease status post left AKA, emesis Silgel cancer.  -Patient presents to ED due to abnormal labs, patient report weakness, remittent dizziness, lightheadedness, nausea, and poor fluid intake, he recently came back from vacation at [facility], where family reported they had a lot of activities they were busy through the 4 days, patient as well with chronic diarrhea for last few month, and this seems Imodium has recently stopped to help, call cardiology office, where labs has been obtained, was significant for multiple abnormalities including calcium 4.3, creatinine 4.3, so they were instructed to stop losartan/hydrochlorothiazide and to come to ED  - in ED blood pressure has been soft initially with systolic in the 50V, it did respond to fluids, EKG showing bigeminy, prolonged QTc with LBBB, potassium was low at 2.7, phosphorous elevated at 6, magnesium low at 0.9, calcium low at 4.3, creatinine elevated at 4.3, no evidence of urinary retention, BUN elevated at 74, bicarb low at 12, and hospitalist consulted to admit.   Clinical Impression  Patient supine in bed and wife present upon therapist arrival and agreeable to participating in PT evaluation. Patient exhibits a left AKA and does not use a prosthesis. Patient functioning near his baseline level with wife adding he primarily uses his RW for his daily weights more than for ambulation. Patient performed bed mobility, transfers and short distance ambulation with min guard for safety in unfamiliar environment and for line management in the ICU. Patient primarily uses manual wheelchair and scooter for mobility at home and in  the community. During ambulation patient demonstrated a slow, labored cadence with RW demonstrating trembling arms and somewhat unsteady. Patient limited by fatigue and on room air throughout. Ambulation is not patient's primary mode of locomotion. Patient evaluated by Physical Therapy with no further acute PT needs identified. All education has been completed and the patient has no further questions.  See below for any follow-up Physical Therapy or equipment needs. PT is signing off. Thank you for this referral.      Recommendations for follow up therapy are one component of a multi-disciplinary discharge planning process, led by the attending physician.  Recommendations may be updated based on patient status, additional functional criteria and insurance authorization.  Follow Up Recommendations No PT follow up      Assistance Recommended at Discharge Set up Supervision/Assistance  Patient can return home with the following  A little help with walking and/or transfers;A little help with bathing/dressing/bathroom;Assistance with cooking/housework;Help with stairs or ramp for entrance    Equipment Recommendations None recommended by PT  Recommendations for Other Services       Functional Status Assessment Patient has had a recent decline in their functional status and demonstrates the ability to make significant improvements in function in a reasonable and predictable amount of time.     Precautions / Restrictions Precautions Precautions: Fall Precaution Comments: patient reports no falls in the last six months Restrictions Weight Bearing Restrictions: No      Mobility  Bed Mobility Overal bed mobility: Modified Independent       General bed mobility comments: increased time with ICU air mattress    Transfers Overall transfer level: Needs assistance Equipment used: Rolling walker (2 wheels)  Transfers: Sit to/from Stand, Bed to chair/wheelchair/BSC Sit to Stand: Min guard    Step pivot transfers: Min guard   Anterior-Posterior transfers: Modified independent (Device/Increase time)   General transfer comment: min guard for safety in unfamiliar environment and line management    Ambulation/Gait Ambulation/Gait assistance: Min guard, Min assist Gait Distance (Feet): 8 Feet Assistive device: Rolling walker (2 wheels) Gait Pattern/deviations: Decreased step length - right, Decreased stride length, Step-to pattern Gait velocity: decreased     General Gait Details: slow, labored cadence with RW demonstrating trembling arms and somewhat unsteady; limited by fatigue; on room air throughout  Stairs    Wheelchair Mobility    Modified Rankin (Stroke Patients Only)       Balance Overall balance assessment: Needs assistance Sitting-balance support: No upper extremity supported, Feet supported Sitting balance-Leahy Scale: Good Sitting balance - Comments: seated at EOB   Standing balance support: Bilateral upper extremity supported, During functional activity, Reliant on assistive device for balance Standing balance-Leahy Scale: Fair Standing balance comment: fair with RW         Pertinent Vitals/Pain Pain Assessment Pain Assessment: No/denies pain    Home Living Family/patient expects to be discharged to:: Private residence Living Arrangements: Spouse/significant other Available Help at Discharge: Family;Available 24 hours/day (son and daughter live near) Type of Home: House Home Access: Ramped entrance     Alternate Level Stairs-Number of Steps: rammp between levels Home Layout: Two level Home Equipment: Conservation officer, nature (2 wheels);Electric scooter;Shower seat - built in;Hand held shower head;Grab bars - tub/shower;Grab bars - toilet;BSC/3in1      Prior Function Prior Level of Function : Driving     Mobility Comments: primarily gets around in manual wheelchair and electric scooter; RW without prosthesis for short distances at times ADLs  Comments: modified independent     Hand Dominance        Extremity/Trunk Assessment   Upper Extremity Assessment Upper Extremity Assessment: Defer to OT evaluation    Lower Extremity Assessment Lower Extremity Assessment: LLE deficits/detail LLE Deficits / Details: left AKA       Communication   Communication: No difficulties  Cognition Arousal/Alertness: Awake/alert Behavior During Therapy: WFL for tasks assessed/performed Overall Cognitive Status: Within Functional Limits for tasks assessed        General Comments      Exercises     Assessment/Plan    PT Assessment Patient does not need any further PT services  PT Problem List Decreased strength;Decreased mobility;Decreased activity tolerance;Decreased balance       PT Treatment Interventions      PT Goals (Current goals can be found in the Care Plan section)  Acute Rehab PT Goals PT Goal Formulation: All assessment and education complete, DC therapy    Frequency          AM-PAC PT "6 Clicks" Mobility  Outcome Measure Help needed turning from your back to your side while in a flat bed without using bedrails?: None Help needed moving from lying on your back to sitting on the side of a flat bed without using bedrails?: None Help needed moving to and from a bed to a chair (including a wheelchair)?: A Little Help needed standing up from a chair using your arms (e.g., wheelchair or bedside chair)?: None Help needed to walk in hospital room?: A Little Help needed climbing 3-5 steps with a railing? : A Little 6 Click Score: 21    End of Session Equipment Utilized During Treatment: Gait belt Activity Tolerance: Patient tolerated  treatment well;Patient limited by fatigue Patient left: in chair;with call bell/phone within reach;with family/visitor present Nurse Communication: Mobility status PT Visit Diagnosis: Unsteadiness on feet (R26.81);Other abnormalities of gait and mobility (R26.89);Muscle weakness  (generalized) (M62.81)    Time: 6948-5462 PT Time Calculation (min) (ACUTE ONLY): 28 min   Charges:   PT Evaluation $PT Eval Low Complexity: 1 Low PT Treatments $Therapeutic Activity: 8-22 mins       Floria Raveling. Hartnett-Rands, MS, PT Per [place] [phone]  Pamala Hurry  Hartnett-Rands 06/24/2022, 3:04 PM

## 2022-06-24 NOTE — Assessment & Plan Note (Addendum)
-  Continue the use of statin at discharge -Heart healthy diet discussed with patient.

## 2022-06-24 NOTE — Assessment & Plan Note (Signed)
-  Overall stable and well-controlled. -Will resume the use of Bystolic and hold Norvasc at discharge -Lisinopril and HCTZ has also been discontinued at this point.   -Continue to maintain adequate hydration -Continue to follow heart healthy diet and reassess blood pressure stability at follow-up visit.

## 2022-06-25 DIAGNOSIS — Z7901 Long term (current) use of anticoagulants: Secondary | ICD-10-CM | POA: Diagnosis not present

## 2022-06-25 DIAGNOSIS — E1149 Type 2 diabetes mellitus with other diabetic neurological complication: Secondary | ICD-10-CM | POA: Diagnosis not present

## 2022-06-25 DIAGNOSIS — N179 Acute kidney failure, unspecified: Secondary | ICD-10-CM | POA: Diagnosis not present

## 2022-06-25 DIAGNOSIS — E785 Hyperlipidemia, unspecified: Secondary | ICD-10-CM | POA: Diagnosis not present

## 2022-06-25 LAB — RENAL FUNCTION PANEL
Albumin: 2.5 g/dL — ABNORMAL LOW (ref 3.5–5.0)
Anion gap: 5 (ref 5–15)
BUN: 56 mg/dL — ABNORMAL HIGH (ref 8–23)
CO2: 13 mmol/L — ABNORMAL LOW (ref 22–32)
Calcium: 6.2 mg/dL — CL (ref 8.9–10.3)
Chloride: 122 mmol/L — ABNORMAL HIGH (ref 98–111)
Creatinine, Ser: 3.34 mg/dL — ABNORMAL HIGH (ref 0.61–1.24)
GFR, Estimated: 18 mL/min — ABNORMAL LOW (ref >60)
Glucose, Bld: 123 mg/dL — ABNORMAL HIGH (ref 70–99)
Phosphorus: 3 mg/dL (ref 2.5–4.6)
Potassium: 3.4 mmol/L — ABNORMAL LOW (ref 3.5–5.1)
Sodium: 140 mmol/L (ref 135–145)

## 2022-06-25 LAB — CBC
HCT: 25.7 % — ABNORMAL LOW (ref 39.0–52.0)
Hemoglobin: 8.7 g/dL — ABNORMAL LOW (ref 13.0–17.0)
MCH: 30.9 pg (ref 26.0–34.0)
MCHC: 33.9 g/dL (ref 30.0–36.0)
MCV: 91.1 fL (ref 80.0–100.0)
Platelets: 299 10*3/uL (ref 150–400)
RBC: 2.82 MIL/uL — ABNORMAL LOW (ref 4.22–5.81)
RDW: 16.6 % — ABNORMAL HIGH (ref 11.5–15.5)
WBC: 9.2 10*3/uL (ref 4.0–10.5)
nRBC: 0 % (ref 0.0–0.2)

## 2022-06-25 LAB — GLUCOSE, CAPILLARY
Glucose-Capillary: 142 mg/dL — ABNORMAL HIGH (ref 70–99)
Glucose-Capillary: 128 mg/dL — ABNORMAL HIGH (ref 70–99)
Glucose-Capillary: 125 mg/dL — ABNORMAL HIGH (ref 70–99)

## 2022-06-25 LAB — POTASSIUM: Potassium: 3 mmol/L — ABNORMAL LOW (ref 3.5–5.1)

## 2022-06-25 MED ORDER — POTASSIUM CHLORIDE CRYS ER 20 MEQ PO TBCR
40.0000 meq | EXTENDED_RELEASE_TABLET | Freq: Every day | ORAL | Status: DC
  Administered 2022-06-25: 40 meq via ORAL
  Filled 2022-06-25: qty 2

## 2022-06-25 MED ORDER — CALCIUM GLUCONATE-NACL 1-0.675 GM/50ML-% IV SOLN
1.0000 g | Freq: Once | INTRAVENOUS | Status: AC
  Administered 2022-06-25: 1000 mg via INTRAVENOUS
  Filled 2022-06-25: qty 50

## 2022-06-25 MED ORDER — LACTATED RINGERS IV SOLN: INTRAVENOUS | Status: AC

## 2022-06-25 NOTE — Progress Notes (Signed)
Progress Note   Patient: [name] LHT:[id number] DOB: 1936-10-22 DOA: 06/23/2022     2 DOS: the patient was seen and examined on 06/25/2022   Brief hospital course: As per H&P written by Dr. Waldron Labs on 06/23/2022 [name]  is a 86 y.o. male, with past medical history of A-fib, on Eliquis, recurrent GI bleed, hypertension, hyperlipidemia, history of CAD status post CABG, peripheral vascular disease status post left AKA, emesis Silgel cancer. -Patient presents to ED due to abnormal labs, patient report weakness, remittent dizziness, lightheadedness, nausea, and poor fluid intake, he recently came back from vacation at [facility], where family reported they had a lot of activities they were busy through the 4 days, patient as well with chronic diarrhea for last few month, and this seems Imodium has recently stopped to help, call cardiology office, where labs has been obtained, was significant for multiple abnormalities including calcium 4.3, creatinine 4.3, so they were instructed to stop losartan/hydrochlorothiazide and to come to ED - in ED blood pressure has been soft initially with systolic in the 57W, it did respond to fluids, EKG showing bigeminy, prolonged QTc with LBBB, potassium was low at 2.7, phosphorous elevated at 6, magnesium low at 0.9, calcium low at 4.3, creatinine elevated at 4.3, no evidence of urinary retention, BUN elevated at 74, bicarb low at 12, and hospitalist consulted to admit  Assessment and Plan: * AKI (acute kidney injury) ([place]) - Acute kidney injury on chronic kidney disease a stage III A as per chart review. -Appears to be secondary to prerenal azotemia from decreased oral intake and significant GI losses.  Patient also continue the use of nephrotoxic agents (lisinopril/HCTZ) throughout this time. -Continue fluid resuscitation -Replete electrolytes, continue sodium bicarbonate and follow renal function trend. -Patient's calcium, potassium and bicarb still out  of wack.  Hyperphosphatemia - In the setting of renal failure -Continue to follow electrolytes trend. -Continue to maintain adequate hydration.  Hypocalcemia - Most likely associated with chronic use of diuretics and ongoing GI losses. -Continue electrolyte repletion -Follow trend.  Hypomagnesemia - In the setting of GI losses -Continue to follow trend and further replete as needed.  Hypokalemia - In the setting of GI losses -Continue to follow electrolytes trend and further replete as needed; currently providing daily supplementation.  Peripheral vascular disease ([place]) - Status post left AKA -Continue outpatient follow-up with vascular surgery.  Chronic anticoagulation - No signs of overt bleeding appreciated currently. -Continue close monitoring of patient's platelet count and hemoglobin trend.  Hx of CABG - Patient denies chest pain or shortness of breath -Continue telemetry monitoring -Outpatient follow-up with cardiology -Once renal function recovers will resume home cardiac regimen.  Permanent atrial fibrillation (HCC) - Rate controlled -Continue Eliquis for secondary prevention; dose has been adjusted to renal function. -Replete electrolytes and continue telemetry monitoring.  Essential hypertension - Overall stable -Continue holding antihypertensive agents in the setting of acute on chronic renal failure -Maintain adequate hydration -Follow-up vital signs.  Dyslipidemia - Continue the use of a statin at discharge -Heart healthy diet discussed with patient.  Diabetes mellitus ([place]) - With nephropathy -As an outpatient-most recent A1c in the 6 range -Continue to follow CBGs. -Continue diet control.   Subjective:  No chest pain, no nausea, no vomiting.  Reports feeling better.  Tolerating diet and expressing stools forming output.  Physical Exam: Vitals:   06/25/22 0300 06/25/22 0400 06/25/22 0500 06/25/22 0600  BP: (!) 151/29 137/62 (!) 140/50 (!)  131/53  Pulse: Marland Kitchen)  48 66 (!) 57 (!) 45  Resp:      Temp: 98.1 F (36.7 C)     TempSrc: Oral     SpO2: 99%  100% 100%  Weight:      Height:       General exam: Alert, awake, oriented x 3; no chest pain, no nausea vomiting. Respiratory system: Good air movement bilaterally; no using accessory muscle.  Good saturation on room air. Cardiovascular system:Rate controlled, no rubs, no gallops, no JVD. Gastrointestinal system: Abdomen is nondistended, soft and nontender. No organomegaly or masses felt. Normal bowel sounds heard. Central nervous system: Alert and oriented. No focal neurological deficits. Extremities: No cyanosis or clubbing; left AKA Skin: No petechiae. Psychiatry: Judgement and insight appear normal. Mood & affect appropriate.   Data Reviewed: Renal function panel: Sodium 140, potassium 3.4, chloride 122, bicarb 13, BUN 56, creatinine 3.34, calcium 6.2, phosphorus 3.0, albumin 2.5 and GFR 18. CBC: WBCs 9.2, hemoglobin 9.7, platelet count 299 K  Family Communication: Wife at bedside.  Disposition: Status is: Inpatient Remains inpatient appropriate because: Still requiring IV medication and electrolyte repletion.   Planned Discharge Destination: Home  Author: Barton Dubois, MD 06/25/2022 7:29 AM  For on call review [email or web address].

## 2022-06-25 NOTE — Progress Notes (Signed)
2 runs of V. tach in 10 minutes.  Both runs 10 beats long.  BMP and mag ordered.

## 2022-06-26 DIAGNOSIS — E785 Hyperlipidemia, unspecified: Secondary | ICD-10-CM | POA: Diagnosis not present

## 2022-06-26 DIAGNOSIS — Z7901 Long term (current) use of anticoagulants: Secondary | ICD-10-CM | POA: Diagnosis not present

## 2022-06-26 DIAGNOSIS — N179 Acute kidney failure, unspecified: Secondary | ICD-10-CM | POA: Diagnosis not present

## 2022-06-26 DIAGNOSIS — E1149 Type 2 diabetes mellitus with other diabetic neurological complication: Secondary | ICD-10-CM | POA: Diagnosis not present

## 2022-06-26 LAB — COMPREHENSIVE METABOLIC PANEL
ALT: 23 U/L (ref 0–44)
AST: 24 U/L (ref 15–41)
Albumin: 2.8 g/dL — ABNORMAL LOW (ref 3.5–5.0)
Alkaline Phosphatase: 113 U/L (ref 38–126)
Anion gap: 11 (ref 5–15)
BUN: 65 mg/dL — ABNORMAL HIGH (ref 8–23)
CO2: 10 mmol/L — ABNORMAL LOW (ref 22–32)
Calcium: 4.8 mg/dL — CL (ref 8.9–10.3)
Chloride: 119 mmol/L — ABNORMAL HIGH (ref 98–111)
Creatinine, Ser: 3.98 mg/dL — ABNORMAL HIGH (ref 0.61–1.24)
GFR, Estimated: 14 mL/min — ABNORMAL LOW (ref >60)
Glucose, Bld: 122 mg/dL — ABNORMAL HIGH (ref 70–99)
Potassium: 3.4 mmol/L — ABNORMAL LOW (ref 3.5–5.1)
Sodium: 140 mmol/L (ref 135–145)
Total Bilirubin: 0.8 mg/dL (ref 0.3–1.2)
Total Protein: 6.4 g/dL — ABNORMAL LOW (ref 6.5–8.1)

## 2022-06-26 LAB — BASIC METABOLIC PANEL
Anion gap: 7 (ref 5–15)
Anion gap: 7 (ref 5–15)
BUN: 47 mg/dL — ABNORMAL HIGH (ref 8–23)
BUN: 47 mg/dL — ABNORMAL HIGH (ref 8–23)
CO2: 14 mmol/L — ABNORMAL LOW (ref 22–32)
CO2: 13 mmol/L — ABNORMAL LOW (ref 22–32)
Calcium: 6 mg/dL — CL (ref 8.9–10.3)
Calcium: 6.1 mg/dL — CL (ref 8.9–10.3)
Chloride: 117 mmol/L — ABNORMAL HIGH (ref 98–111)
Chloride: 118 mmol/L — ABNORMAL HIGH (ref 98–111)
Creatinine, Ser: 2.78 mg/dL — ABNORMAL HIGH (ref 0.61–1.24)
Creatinine, Ser: 2.8 mg/dL — ABNORMAL HIGH (ref 0.61–1.24)
GFR, Estimated: 22 mL/min — ABNORMAL LOW (ref >60)
GFR, Estimated: 22 mL/min — ABNORMAL LOW (ref >60)
Glucose, Bld: 118 mg/dL — ABNORMAL HIGH (ref 70–99)
Glucose, Bld: 107 mg/dL — ABNORMAL HIGH (ref 70–99)
Potassium: 3.2 mmol/L — ABNORMAL LOW (ref 3.5–5.1)
Potassium: 3 mmol/L — ABNORMAL LOW (ref 3.5–5.1)
Sodium: 138 mmol/L (ref 135–145)
Sodium: 138 mmol/L (ref 135–145)

## 2022-06-26 LAB — GLUCOSE, CAPILLARY
Glucose-Capillary: 100 mg/dL — ABNORMAL HIGH (ref 70–99)
Glucose-Capillary: 115 mg/dL — ABNORMAL HIGH (ref 70–99)

## 2022-06-26 LAB — MAGNESIUM
Magnesium: 1.3 mg/dL — ABNORMAL LOW (ref 1.7–2.4)
Magnesium: 1.9 mg/dL (ref 1.7–2.4)

## 2022-06-26 MED ORDER — POTASSIUM CHLORIDE CRYS ER 20 MEQ PO TBCR
40.0000 meq | EXTENDED_RELEASE_TABLET | Freq: Two times a day (BID) | ORAL | Status: DC
  Administered 2022-06-26 – 2022-06-27 (×3): 40 meq via ORAL
  Filled 2022-06-26 (×3): qty 2

## 2022-06-26 MED ORDER — MAGNESIUM SULFATE 2 GM/50ML IV SOLN
2.0000 g | Freq: Once | INTRAVENOUS | Status: AC
  Administered 2022-06-26: 2 g via INTRAVENOUS
  Filled 2022-06-26: qty 50

## 2022-06-26 MED ORDER — LACTATED RINGERS IV SOLN
INTRAVENOUS | Status: AC
Start: 2022-06-26 — End: 2022-06-27

## 2022-06-26 NOTE — TOC Initial Note (Signed)
Transition of Care [facility]) - Initial/Assessment Note    Patient Details  Name: [name] MRN: [id number] Date of Birth: 1936-09-20  [facility]) CM/SW Contact:    Iona Beard, [place] Phone Number: 06/26/2022, 11:04 AM  Clinical Narrative:                 Pt is high risk for readmission. CSW attempted call to pts wife's cell phone, home phone, and pts room phone. CSW unable to get answer at this time and will await return call. Per chart review pt is from home with wife. TOC will continue to follow pt during hospital admission for any needs. TOC to follow.   Expected Discharge Plan: Home/Self Care Barriers to Discharge: Continued Medical Work up   Patient Goals and CMS Choice Patient states their goals for this hospitalization and ongoing recovery are:: return home CMS Medicare.gov Compare Post Acute Care list provided to:: Patient Choice offered to / list presented to : Patient  Expected Discharge Plan and Services Expected Discharge Plan: Home/Self Care In-house Referral: Clinical Social Work Discharge Planning Services: CM Consult   Living arrangements for the past 2 months: Single Family Home                                      Prior Living Arrangements/Services Living arrangements for the past 2 months: Single Family Home Lives with:: Spouse Patient language and need for interpreter reviewed:: Yes Do you feel safe going back to the place where you live?: Yes      Need for Family Participation in Patient Care: Yes (Comment) Care giver support system in place?: Yes (comment)   Criminal Activity/Legal Involvement Pertinent to Current Situation/Hospitalization: No - Comment as needed  Activities of Daily Living Home Assistive Devices/Equipment: Wheelchair ADL Screening (condition at time of admission) Patient's cognitive ability adequate to safely complete daily activities?: Yes Is the patient deaf or have difficulty hearing?: No Does the  patient have difficulty seeing, even when wearing glasses/contacts?: No Does the patient have difficulty concentrating, remembering, or making decisions?: No Patient able to express need for assistance with ADLs?: Yes Does the patient have difficulty dressing or bathing?: No Independently performs ADLs?: Yes (appropriate for developmental age) Does the patient have difficulty walking or climbing stairs?: Yes (Amputee) Weakness of Legs: Right Weakness of Arms/Hands: None  Permission Sought/Granted                  Emotional Assessment Appearance:: Appears stated age       Alcohol / Substance Use: Not Applicable Psych Involvement: No (comment)  Admission diagnosis:  Hypocalcemia [E83.51] Hypokalemia [E87.6] Hypomagnesemia [E83.42] AKI (acute kidney injury) ([place]) [N17.9] Acute renal failure superimposed on chronic kidney disease, unspecified CKD stage, unspecified acute renal failure type ([place]) [N17.9, N18.9] Patient Active Problem List   Diagnosis Date Noted   Hypokalemia 06/23/2022   Hypomagnesemia 06/23/2022   Hypocalcemia 06/23/2022   Hyperphosphatemia 06/23/2022   Pure hypercholesterolemia 08/22/2021   Stroke ([employer]) 01/04/2019   Stroke (cerebrum) ([place]) 01/04/2019   Middle cerebral artery embolism, left (HCC) s/p mechanical thrombectomy 01/04/2019   Gastrointestinal hemorrhage    AKI (acute kidney injury) ([place])    Anemia 03/26/2018   Melena 02/21/2018   Upper GI bleed 02/13/2018   Coagulopathy ([place]) 02/13/2018   Acute renal failure superimposed on stage 3 chronic kidney disease ([place]) 02/13/2018   Weight gain 11/30/2015  Coronary artery disease involving coronary bypass graft of native heart without angina pectoris    Tachycardia    Leukocytosis    Abnormality of gait    Acute blood loss anemia    Acute pulmonary edema (HCC)    Acute congestive heart failure (HCC)    Acute respiratory failure with hypoxia (HCC)    Pressure ulcer 11/09/2015   Necrotic toes  ([place]) 11/08/2015   Lower extremity cellulitis 11/08/2015   Bradycardia 05/28/2015   PVD (peripheral vascular disease) with claudication ([place]) 09/30/2014   Peripheral vascular disease ([place]) 08/06/2012   Chronic anticoagulation 01/20/2011   Diabetes mellitus ([place]) 03/04/2010   Dyslipidemia 03/04/2010   Essential hypertension 03/04/2010   Hx of CABG 03/04/2010   Permanent atrial fibrillation ([place]) 04/08/2009   PCP:  Lemmie Evens, MD Pharmacy:   [facility] [place] [facility] [address] [place] [address] Phone: [phone] Fax: [phone]  [id number] [place] [address]Phone: [phone]: [phone]    Social Determinants of Health (SDOH) Interventions    Readmission Risk Interventions    06/26/2022   11:03 AM  Readmission Risk Prevention Plan  Transportation Screening Complete  [place] or [place]  Social Work Consult for RSullyPlanning/Counseling Complete  Palliative Care Screening Not Applicable  Medication Review (Press photographer Complete

## 2022-06-26 NOTE — Progress Notes (Signed)
OT Cancellation Note  Patient Details Name: [name] MRN: [id number] DOB: 03-16-1936   Cancelled Treatment:    Reason Eval/Treat Not Completed: OT screened, no needs identified, will sign off. Pt reports doing well with independence at baseline. Pt transferred to and from [facility] without RW independently.  Merida Alcantar OT, MOT   Larey Seat 06/26/2022, 12:13 PM

## 2022-06-26 NOTE — Progress Notes (Signed)
Progress Note   Patient: [name] LNL:[id number] DOB: 01/24/1936 DOA: 06/23/2022     3 DOS: the patient was seen and examined on 06/26/2022   Brief hospital course: As per H&P written by Dr. Waldron Labs on 06/23/2022 Baltazar Pekala  is a 86 y.o. male, with past medical history of A-fib, on Eliquis, recurrent GI bleed, hypertension, hyperlipidemia, history of CAD status post CABG, peripheral vascular disease status post left AKA, emesis Silgel cancer. -Patient presents to ED due to abnormal labs, patient report weakness, remittent dizziness, lightheadedness, nausea, and poor fluid intake, he recently came back from vacation at [facility], where family reported they had a lot of activities they were busy through the 4 days, patient as well with chronic diarrhea for last few month, and this seems Imodium has recently stopped to help, call cardiology office, where labs has been obtained, was significant for multiple abnormalities including calcium 4.3, creatinine 4.3, so they were instructed to stop losartan/hydrochlorothiazide and to come to ED - in ED blood pressure has been soft initially with systolic in the 40C, it did respond to fluids, EKG showing bigeminy, prolonged QTc with LBBB, potassium was low at 2.7, phosphorous elevated at 6, magnesium low at 0.9, calcium low at 4.3, creatinine elevated at 4.3, no evidence of urinary retention, BUN elevated at 74, bicarb low at 12, and hospitalist consulted to admit  Assessment and Plan: * AKI (acute kidney injury) ([place]) -Acute kidney injury on chronic kidney disease stage III A as per chart review. -Appears to be secondary to prerenal azotemia from decreased oral intake and significant GI losses.  Patient also continue the use of nephrotoxic agents (lisinopril/HCTZ) throughout this time. -Continue fluid resuscitation and maintain adequate hydration. -Continue to replete electrolytes, continue sodium bicarbonate and follow renal function trend. -Patient's  calcium, potassium and bicarb still out of wack.  Continue to follow trend.  Hyperphosphatemia -In the setting of renal failure. -Continue to follow electrolytes trend. -Continue to maintain adequate hydration.  Hypocalcemia -Most likely associated with chronic use of diuretics and ongoing GI losses. -Continue electrolyte repletion -Continue to follow trend. -Calcium adequately trending up.  Hypomagnesemia -In the setting of GI losses -Continue to follow trend and further replete as needed.  Hypokalemia -In the setting of GI losses -Continue to follow electrolytes trend and further replete as needed; currently providing daily supplementation.  Peripheral vascular disease ([place]) -Status post left AKA -Continue outpatient follow-up with vascular surgery.  Chronic anticoagulation -No signs of overt bleeding appreciated currently. -Continue close monitoring of patient's platelet count and hemoglobin trend.  Hx of CABG -Patient denies chest pain or shortness of breath -Continue telemetry monitoring -Continue outpatient follow-up with cardiology -Once renal function recovers will resume home cardiac regimen.  Permanent atrial fibrillation (HCC) -Rate controlled -Continue Eliquis for secondary prevention; dose has been adjusted to renal function. -Continue to replete electrolytes and continue telemetry monitoring.  Essential hypertension -Overall stable and well-controlled. -Continue holding antihypertensive agents in the setting of acute on chronic renal failure -Continue to maintain adequate hydration -Continue to follow-up vital signs.  Dyslipidemia -Continue the use of a statin at discharge -Heart healthy diet discussed with patient.  Diabetes mellitus ([place]) -With nephropathy -As an outpatient-most recent A1c in the 6 range -Continue to follow CBGs. -Continue diet control.   Subjective:  Reporting no chest pain, no nausea, no vomiting, no diarrhea.  Overall  feeling significantly better and expressing increasing his appetite.  Electrolytes still impair requiring repletion and renal function not yet back  to his baseline.  Physical Exam: Vitals:   06/26/22 0800 06/26/22 0900 06/26/22 1000 06/26/22 1116  BP: (!) 152/50 (!) 140/80 134/84   Pulse: (!) 108 61 69   Resp: 15 13 (!) 23   Temp: 98.2 F (36.8 C)   98.1 F (36.7 C)  TempSrc: Oral   Oral  SpO2: 99% 100% 100%   Weight:      Height:        General exam: Alert, awake, oriented x 3; reports feeling significantly better today; no chest pain, no nausea, no vomiting, no diarrhea and expressing improving his appetite. Respiratory system: Clear to auscultation. Respiratory effort normal.  No using accessory muscles. Cardiovascular system: Rate controlled, no rubs, no gallops, no JVD. Gastrointestinal system: Abdomen is nondistended, soft and nontender. No organomegaly or masses felt. Normal bowel sounds heard. Central nervous system: Alert and oriented. No focal neurological deficits. Extremities: No cyanosis or clubbing; left AKA Skin: No petechiae. Psychiatry: Judgement and insight appear normal. Mood & affect appropriate.   Data Reviewed: Basic metabolic panel: Potassium 3.2, bicarb 14, BUN 47, creatinine 2.78, calcium 6.0 and GFR 22.  Sodium level 138 Magnesium 1.9.  Family Communication: Wife at bedside.  Disposition: Status is: Inpatient Remains inpatient appropriate because: Still requiring IV medication and electrolyte repletion.   Planned Discharge Destination: Home  Author: Barton Dubois, MD 06/26/2022 11:25 AM  For on call review [email or web address].

## 2022-06-27 DIAGNOSIS — E1149 Type 2 diabetes mellitus with other diabetic neurological complication: Secondary | ICD-10-CM | POA: Diagnosis not present

## 2022-06-27 DIAGNOSIS — N179 Acute kidney failure, unspecified: Secondary | ICD-10-CM | POA: Diagnosis not present

## 2022-06-27 DIAGNOSIS — E785 Hyperlipidemia, unspecified: Secondary | ICD-10-CM | POA: Diagnosis not present

## 2022-06-27 DIAGNOSIS — Z7901 Long term (current) use of anticoagulants: Secondary | ICD-10-CM | POA: Diagnosis not present

## 2022-06-27 LAB — GASTROINTESTINAL PANEL BY PCR, STOOL (REPLACES STOOL CULTURE)

## 2022-06-27 LAB — RENAL FUNCTION PANEL
Albumin: 2.6 g/dL — ABNORMAL LOW (ref 3.5–5.0)
Anion gap: 5 (ref 5–15)
BUN: 41 mg/dL — ABNORMAL HIGH (ref 8–23)
CO2: 19 mmol/L — ABNORMAL LOW (ref 22–32)
Calcium: 6.5 mg/dL — ABNORMAL LOW (ref 8.9–10.3)
Chloride: 119 mmol/L — ABNORMAL HIGH (ref 98–111)
Creatinine, Ser: 2.32 mg/dL — ABNORMAL HIGH (ref 0.61–1.24)
GFR, Estimated: 27 mL/min — ABNORMAL LOW (ref >60)
Glucose, Bld: 113 mg/dL — ABNORMAL HIGH (ref 70–99)
Phosphorus: 2.1 mg/dL — ABNORMAL LOW (ref 2.5–4.6)
Potassium: 4.3 mmol/L (ref 3.5–5.1)
Sodium: 143 mmol/L (ref 135–145)

## 2022-06-27 MED ORDER — DIPHENOXYLATE-ATROPINE 2.5-0.025 MG PO TABS: 1.0000 | ORAL_TABLET | Freq: Four times a day (QID) | ORAL | 1 refills | Status: DC | PRN

## 2022-06-27 MED ORDER — APIXABAN 2.5 MG PO TABS: 2.5000 mg | ORAL_TABLET | Freq: Two times a day (BID) | ORAL | 1 refills | Status: DC

## 2022-06-27 MED ORDER — SODIUM BICARBONATE 650 MG PO TABS
1300.0000 mg | ORAL_TABLET | Freq: Two times a day (BID) | ORAL | 1 refills | Status: DC
Start: 2022-06-27 — End: 2022-08-14

## 2022-06-27 MED ORDER — POTASSIUM CHLORIDE CRYS ER 20 MEQ PO TBCR: 40.0000 meq | EXTENDED_RELEASE_TABLET | Freq: Every day | ORAL | 1 refills | Status: DC

## 2022-06-27 MED ORDER — BOOST / RESOURCE BREEZE PO LIQD: 237.0000 mL | Freq: Two times a day (BID) | ORAL | Status: AC

## 2022-06-27 MED ORDER — CALCIUM CARBONATE 1250 (500 CA) MG PO TABS: 1250.0000 mg | ORAL_TABLET | Freq: Every day | ORAL | 1 refills | Status: DC

## 2022-06-27 NOTE — Progress Notes (Signed)
Patient discharged home today, transported home by family. Discharge paperwork went over with patient and wife, both verbalized understanding. Belongings sent home with patient.

## 2022-06-27 NOTE — Care Management Important Message (Signed)
Important Message  Patient Details  Name: [name] MRN: [id number] Date of Birth: [birth year]   Medicare Important Message Given:  N/A - LOS <3 / Initial given by admissions     Tommy Medal 06/27/2022, 10:44 AM

## 2022-06-27 NOTE — Discharge Summary (Signed)
Physician Discharge Summary   Patient: [name] MRN: [id number] DOB: [birth year]  Admit date:     06/23/2022  Discharge date: 06/27/22  Discharge Physician: Barton Dubois   PCP: Lemmie Evens, MD   Recommendations at discharge:  Repeat basic metabolic panel to follow electrolytes and renal function Repeat magnesium level and phosphorus to assess stability. Continue close monitoring of patient's CBGs/A1c with decision to initiate hypoglycemic agent as required. Reassess blood pressure and adjust antihypertensive regimen as needed Make sure patient has follow-up with cardiology service as previously instructed.  Discharge Diagnoses: Principal Problem:   AKI (acute kidney injury) ([place]) Active Problems:   Diabetes mellitus ([place])   Dyslipidemia   Essential hypertension   Permanent atrial fibrillation (HCC)   Hx of CABG   Chronic anticoagulation   Peripheral vascular disease (HCC)   Acute renal failure superimposed on chronic kidney disease ([place])   Hypokalemia   Hypomagnesemia   Hypocalcemia   Hyperphosphatemia  Brief Hospital admission Course: As per H&P written by Dr. Waldron Labs on 06/23/2022 [name]  is a 86 y.o. male, with past medical history of A-fib, on Eliquis, recurrent GI bleed, hypertension, hyperlipidemia, history of CAD status post CABG, peripheral vascular disease status post left AKA, emesis Silgel cancer. -Patient presents to ED due to abnormal labs, patient report weakness, remittent dizziness, lightheadedness, nausea, and poor fluid intake, he recently came back from vacation at [facility], where family reported they had a lot of activities they were busy through the 4 days, patient as well with chronic diarrhea for last few month, and this seems Imodium has recently stopped to help, call cardiology office, where labs has been obtained, was significant for multiple abnormalities including calcium 4.3, creatinine 4.3, so they were instructed to stop  losartan/hydrochlorothiazide and to come to ED - in ED blood pressure has been soft initially with systolic in the 17G, it did respond to fluids, EKG showing bigeminy, prolonged QTc with LBBB, potassium was low at 2.7, phosphorous elevated at 6, magnesium low at 0.9, calcium low at 4.3, creatinine elevated at 4.3, no evidence of urinary retention, BUN elevated at 74, bicarb low at 12, and hospitalist consulted to admit  Assessment and Plan: * AKI (acute kidney injury) ([place]) -Acute kidney injury on chronic kidney disease stage III A as per chart review. -Appears to be secondary to prerenal azotemia from decreased oral intake and significant GI losses.  Patient also continue the use of nephrotoxic agents (lisinopril/HCTZ) throughout that time. -HCTZ and lisinopril has been discontinued at discharge -Patient advised to follow heart healthy diet and to maintain adequate hydration -Electrolytes has been repleted and pretty close to normal range with instructions and prescription to continue daily supplementation. -Continue to follow electrolytes and renal function and stability with repeat blood work at follow-up visit.    Hyperphosphatemia -In the setting of renal failure. -Continue to follow electrolytes trend. -Continue to maintain adequate hydration.  Hypocalcemia -Most likely associated with chronic use of diuretics and ongoing GI losses. -Repleted and stabilized; continue daily supplementation at discharge.  Hypomagnesemia -In the setting of GI losses -Continue to follow trend and further replete as needed.  Hypokalemia -In the setting of GI losses -Continue to follow electrolytes trend and further replete as needed; currently providing daily supplementation.  Peripheral vascular disease ([place]) -Status post left AKA -Continue outpatient follow-up with vascular surgery.  Chronic anticoagulation -No signs of overt bleeding appreciated currently. -Continue close monitoring of  patient's platelet count and hemoglobin trend.  Hx of  CABG -Patient denies chest pain or shortness of breath -No acute ischemic abnormalities appreciated on EKG or telemetry. -Continue outpatient follow-up with cardiology -At discharge continue the use of Bystolic. -Continue outpatient follow-up with cardiology service.   Permanent atrial fibrillation (HCC) -Rate controlled -Continue Eliquis for secondary prevention; dose has been adjusted to his renal function. -Continue to replete electrolytes and continue outpatient follow-up with cardiology service.  Essential hypertension -Overall stable and well-controlled. -Will resume the use of Bystolic and hold Norvasc at discharge -Lisinopril and HCTZ has also been discontinued at this point.   -Continue to maintain adequate hydration -Continue to follow heart healthy diet and reassess blood pressure stability at follow-up visit.   Dyslipidemia -Continue the use of statin at discharge -Heart healthy diet discussed with patient.  Diabetes mellitus ([place]) -With nephropathy and peripheral neuropathy. -As an outpatient-most recent A1c in the 6 range -Continue to follow CBGs/A1c and decide the need for initiation on hypoglycemic regimen.. -For now patient has been encouraged to continue diet control (modified carbohydrate diet).  Consultants: None Procedures performed: See below for x-ray reports. Disposition: Home Diet recommendation: Heart healthy modified carbohydrate diet.  DISCHARGE MEDICATION: Allergies as of 06/27/2022       Reactions   Lipitor [atorvastatin] Other (See Comments)   SEVERE HEADACHE   Simvastatin Other (See Comments)   MUSCLE ACHES   Penicillins Rash   Has patient had a PCN reaction causing immediate rash, facial/tongue/throat swelling, SOB or lightheadedness with hypotension: Yes Has patient had a PCN reaction causing severe rash involving mucus membranes or skin necrosis: No Has patient had a PCN reaction  that required hospitalization No Has patient had a PCN reaction occurring within the last 10 years: No If all of the above answers are "NO", then may proceed with Cephalosporin use.   Sulfa Antibiotics Rash   Tolerates silver sulfadiazine cream at home        Medication List     STOP taking these medications    amLODipine 10 MG tablet Commonly known as: NORVASC       TAKE these medications    acetaminophen 325 MG tablet Commonly known as: Tylenol Take 2 tablets (650 mg total) by mouth every 6 (six) hours as needed for moderate pain. What changed: how much to take   ammonium lactate 12 % cream Commonly known as: AMLACTIN Apply topically 2 (two) times daily.   apixaban 2.5 MG Tabs tablet Commonly known as: Eliquis Take 1 tablet (2.5 mg total) by mouth 2 (two) times daily. What changed:  medication strength how much to take   calcium carbonate 1250 (500 Ca) MG tablet Commonly known as: OS-CAL - dosed in mg of elemental calcium Take 1 tablet (1,250 mg total) by mouth daily with breakfast. Start taking on: June 28, 2022   cetirizine 10 MG tablet Commonly known as: ZYRTEC Take 10 mg by mouth daily.   diphenoxylate-atropine 2.5-0.025 MG tablet Commonly known as: Lomotil Take 1 tablet by mouth 4 (four) times daily as needed for diarrhea or loose stools.   feeding supplement Liqd Take 1 Container by mouth 2 (two) times daily between meals. What changed: when to take this   Fish Oil 1200 MG Caps Take 1,200 mg by mouth daily.   FLINTSTONES PLUS IRON PO Take 1 tablet by mouth daily.   halobetasol 0.05 % cream Commonly known as: ULTRAVATE Apply topically.   lovastatin 10 MG tablet Commonly known as: MEVACOR Take 10 mg by mouth daily.   nebivolol 10 MG  tablet Commonly known as: BYSTOLIC Take 10 mg by mouth daily.   nystatin cream Commonly known as: MYCOSTATIN Apply 1 application topically 3 (three) times daily as needed (skin irritation/rash.).    OPTIVE 0.5-0.9 % ophthalmic solution Generic drug: carboxymethylcellul-glycerin Place 1 drop into both eyes daily as needed for dry eyes.   PHILLIPS COLON HEALTH PO Take 1 capsule by mouth daily.   potassium chloride SA 20 MEQ tablet Commonly known as: KLOR-CON M Take 2 tablets (40 mEq total) by mouth daily.   silver sulfADIAZINE 1 % cream Commonly known as: SILVADENE Apply 1 application topically daily.   sodium bicarbonate 650 MG tablet Take 2 tablets (1,300 mg total) by mouth 2 (two) times daily.        Follow-up Information     Lemmie Evens, MD. Schedule an appointment as soon as possible for a visit in 10 day(s).   Specialty: Family Medicine Contact information: [address] [place] [address] [phone]         Jerline Pain, MD .   Specialty: Cardiology Contact information: [phone] [address] [place] [address] [phone]                Discharge Exam: Danley Danker Weights   06/23/22 1733 06/24/22 0429 06/26/22 0437  Weight: 66.5 kg 67.1 kg 68.2 kg   General exam: Alert, awake, oriented x 3; physically stable and ready to go home.  No chest pain, no nausea, no vomiting, no overnight events. Respiratory system: Clear to auscultation. Respiratory effort normal. Cardiovascular system:RRR. No murmurs, rubs, gallops. Gastrointestinal system: Abdomen is nondistended, soft and nontender. No organomegaly or masses felt. Normal bowel sounds heard. Central nervous system: Alert and oriented. No focal neurological deficits. Extremities: No cyanosis or clubbing; positive left AKA Skin: No petechiae. Psychiatry: Judgement and insight appear normal. Mood & affect appropriate.    Condition at discharge: Stable and improved.  The results of significant diagnostics from this hospitalization (including imaging, microbiology, ancillary and laboratory) are listed below for reference.   Imaging Studies: No results  found.  Microbiology: Results for orders placed or performed during the hospital encounter of 06/23/22  MRSA Next Gen by PCR, Nasal     Status: Abnormal   Collection Time: 06/23/22  5:29 PM  Result Value Ref Range Status   MRSA by PCR Next Gen DETECTED (A) NOT DETECTED Final    Comment: RESULT CALLED TO, READ BACK BY AND VERIFIED WITH: KINDLEY,[email or web address]  BY MATTHEWS, B 9.2.2009 (NOTE) The GeneXpert MRSA Assay (FDA approved for NASAL specimens only), is one component of a comprehensive MRSA colonization surveillance program. It is not intended to diagnose MRSA infection nor to guide or monitor treatment for MRSA infections. Test performance is not FDA approved in patients less than 71 years old. Performed at [facility], [address] [place], [place] [address]   Gastrointestinal Panel by PCR , Stool     Status: None   Collection Time: 06/23/22  9:30 PM   Specimen: STOOL  Result Value Ref Range Status   Campylobacter species NOT DETECTED NOT DETECTED Final   Plesimonas shigelloides NOT DETECTED NOT DETECTED Final   Salmonella species NOT DETECTED NOT DETECTED Final   Yersinia enterocolitica NOT DETECTED NOT DETECTED Final   Vibrio species NOT DETECTED NOT DETECTED Final   Vibrio cholerae NOT DETECTED NOT DETECTED Final   Enteroaggregative E coli (EAEC) NOT DETECTED NOT DETECTED Final   Enteropathogenic E coli (EPEC) NOT DETECTED NOT DETECTED Final  Enterotoxigenic E coli (ETEC) NOT DETECTED NOT DETECTED Final   Shiga like toxin producing E coli (STEC) NOT DETECTED NOT DETECTED Final   Shigella/Enteroinvasive E coli (EIEC) NOT DETECTED NOT DETECTED Final   Cryptosporidium NOT DETECTED NOT DETECTED Final   Cyclospora cayetanensis NOT DETECTED NOT DETECTED Final   Entamoeba histolytica NOT DETECTED NOT DETECTED Final   Giardia lamblia NOT DETECTED NOT DETECTED Final   Adenovirus F40/41 NOT DETECTED NOT DETECTED Final   Astrovirus NOT DETECTED NOT DETECTED Final   Norovirus GI/GII  NOT DETECTED NOT DETECTED Final   Rotavirus A NOT DETECTED NOT DETECTED Final   Sapovirus (I, II, IV, and V) NOT DETECTED NOT DETECTED Final    Comment: Performed at [facility], [address] [place], [place] [address]    Labs: CBC: Recent Labs  Lab 06/23/22 1342 06/24/22 0346 06/25/22 0332  WBC 10.0 9.5 9.2  HGB 9.8* 10.2* 8.7*  HCT 28.3* 29.6* 25.7*  MCV 90.4 90.8 91.1  PLT 353 355 297   Basic Metabolic Panel: Recent Labs  Lab 06/23/22 1436 06/23/22 2152 06/24/22 0346 06/25/22 0332 06/25/22 0532 06/26/22 0008 06/26/22 0407 06/27/22 0928  NA  --  140 141 140  --  138 138 143  K  --  3.4* 3.1* 3.4* 3.0* 3.2* 3.0* 4.3  CL  --  119* 120* 122*  --  117* 118* 119*  CO2  --  10* 10* 13*  --  14* 13* 19*  GLUCOSE  --  122* 118* 123*  --  118* 107* 113*  BUN  --  65* 64* 56*  --  47* 47* 41*  CREATININE  --  3.98* 4.08* 3.34*  --  2.78* 2.80* 2.32*  CALCIUM  --  4.8* 5.7* 6.2*  --  6.0* 6.1* 6.5*  MG 0.9* 2.2  --   --   --  1.3* 1.9  --   PHOS 6.0*  --  4.6 3.0  --   --   --  2.1*   Liver Function Tests: Recent Labs  Lab 06/23/22 1522 06/23/22 1557 06/23/22 2152 06/24/22 0346 06/25/22 0332 06/27/22 0928  AST 23  --  24  --   --   --   ALT 26  --  23  --   --   --   ALKPHOS 126  --  113  --   --   --   BILITOT 0.9  --  0.8  --   --   --   PROT 6.4*  --  6.4*  --   --   --   ALBUMIN 2.9* 3.0* 2.8* 2.9* 2.5* 2.6*   CBG: Recent Labs  Lab 06/25/22 0734 06/25/22 1145 06/25/22 2044 06/26/22 0715 06/26/22 1115  GLUCAP 142* 128* 125* 100* 115*    Discharge time spent: greater than 30 minutes.  Signed: Barton Dubois, MD [facility] 06/27/2022

## 2022-06-27 NOTE — Progress Notes (Signed)
Informed by telemetry that patient had a 5 beat run of v-tach. Notified Dr. Clearence Ped no new orders at this time.

## 2022-06-28 ENCOUNTER — Telehealth: Payer: Self-pay | Admitting: *Deleted

## 2022-07-25 ENCOUNTER — Other Ambulatory Visit: Payer: Self-pay

## 2022-07-25 MED ORDER — POTASSIUM CHLORIDE CRYS ER 20 MEQ PO TBCR: 40.0000 meq | EXTENDED_RELEASE_TABLET | Freq: Every day | ORAL | 1 refills | Status: DC

## 2022-08-10 ENCOUNTER — Other Ambulatory Visit: Payer: Self-pay

## 2022-08-10 ENCOUNTER — Ambulatory Visit: Admission: EM | Admit: 2022-08-10 | Discharge: 2022-08-10 | Payer: Medicare Other

## 2022-08-10 ENCOUNTER — Inpatient Hospital Stay (HOSPITAL_COMMUNITY)
Admission: EM | Admit: 2022-08-10 | Discharge: 2022-08-14 | DRG: 291 | Disposition: A | Discharge status: Home Health | Payer: Medicare Other | Attending: Family Medicine | Admitting: Family Medicine

## 2022-08-10 ENCOUNTER — Encounter (HOSPITAL_COMMUNITY): Payer: Self-pay | Admitting: *Deleted

## 2022-08-10 ENCOUNTER — Emergency Department (HOSPITAL_COMMUNITY): Payer: Medicare Other

## 2022-08-10 ENCOUNTER — Ambulatory Visit: Payer: Self-pay

## 2022-08-10 ENCOUNTER — Encounter: Payer: Self-pay | Admitting: Emergency Medicine

## 2022-08-10 ENCOUNTER — Telehealth: Payer: Self-pay | Admitting: *Deleted

## 2022-08-10 DIAGNOSIS — I472 Ventricular tachycardia, unspecified: Secondary | ICD-10-CM | POA: Diagnosis present

## 2022-08-10 DIAGNOSIS — E1142 Type 2 diabetes mellitus with diabetic polyneuropathy: Secondary | ICD-10-CM | POA: Diagnosis present

## 2022-08-10 DIAGNOSIS — Z888 Allergy status to other drugs, medicaments and biological substances status: Secondary | ICD-10-CM

## 2022-08-10 DIAGNOSIS — R0602 Shortness of breath: Principal | ICD-10-CM

## 2022-08-10 DIAGNOSIS — N1831 Chronic kidney disease, stage 3a: Secondary | ICD-10-CM | POA: Diagnosis present

## 2022-08-10 DIAGNOSIS — Z79899 Other long term (current) drug therapy: Secondary | ICD-10-CM

## 2022-08-10 DIAGNOSIS — N179 Acute kidney failure, unspecified: Secondary | ICD-10-CM | POA: Diagnosis present

## 2022-08-10 DIAGNOSIS — E876 Hypokalemia: Secondary | ICD-10-CM | POA: Diagnosis present

## 2022-08-10 DIAGNOSIS — I4821 Permanent atrial fibrillation: Secondary | ICD-10-CM | POA: Diagnosis present

## 2022-08-10 DIAGNOSIS — Z951 Presence of aortocoronary bypass graft: Secondary | ICD-10-CM

## 2022-08-10 DIAGNOSIS — Z89612 Acquired absence of left leg above knee: Secondary | ICD-10-CM

## 2022-08-10 DIAGNOSIS — I872 Venous insufficiency (chronic) (peripheral): Secondary | ICD-10-CM | POA: Diagnosis present

## 2022-08-10 DIAGNOSIS — Z882 Allergy status to sulfonamides status: Secondary | ICD-10-CM

## 2022-08-10 DIAGNOSIS — Z8673 Personal history of transient ischemic attack (TIA), and cerebral infarction without residual deficits: Secondary | ICD-10-CM

## 2022-08-10 DIAGNOSIS — I25810 Atherosclerosis of coronary artery bypass graft(s) without angina pectoris: Secondary | ICD-10-CM | POA: Diagnosis not present

## 2022-08-10 DIAGNOSIS — I429 Cardiomyopathy, unspecified: Secondary | ICD-10-CM | POA: Diagnosis present

## 2022-08-10 DIAGNOSIS — R600 Localized edema: Secondary | ICD-10-CM | POA: Diagnosis not present

## 2022-08-10 DIAGNOSIS — E782 Mixed hyperlipidemia: Secondary | ICD-10-CM | POA: Diagnosis present

## 2022-08-10 DIAGNOSIS — Z880 Allergy status to penicillin: Secondary | ICD-10-CM

## 2022-08-10 DIAGNOSIS — Z8679 Personal history of other diseases of the circulatory system: Secondary | ICD-10-CM

## 2022-08-10 DIAGNOSIS — E1122 Type 2 diabetes mellitus with diabetic chronic kidney disease: Secondary | ICD-10-CM | POA: Diagnosis present

## 2022-08-10 DIAGNOSIS — D631 Anemia in chronic kidney disease: Secondary | ICD-10-CM | POA: Diagnosis present

## 2022-08-10 DIAGNOSIS — Z8249 Family history of ischemic heart disease and other diseases of the circulatory system: Secondary | ICD-10-CM | POA: Diagnosis not present

## 2022-08-10 DIAGNOSIS — I5031 Acute diastolic (congestive) heart failure: Secondary | ICD-10-CM | POA: Diagnosis not present

## 2022-08-10 DIAGNOSIS — I5030 Unspecified diastolic (congestive) heart failure: Secondary | ICD-10-CM | POA: Diagnosis not present

## 2022-08-10 DIAGNOSIS — Z7901 Long term (current) use of anticoagulants: Secondary | ICD-10-CM

## 2022-08-10 DIAGNOSIS — R9431 Abnormal electrocardiogram [ECG] [EKG]: Secondary | ICD-10-CM

## 2022-08-10 DIAGNOSIS — I5041 Acute combined systolic (congestive) and diastolic (congestive) heart failure: Secondary | ICD-10-CM | POA: Diagnosis not present

## 2022-08-10 DIAGNOSIS — E1151 Type 2 diabetes mellitus with diabetic peripheral angiopathy without gangrene: Secondary | ICD-10-CM | POA: Diagnosis present

## 2022-08-10 DIAGNOSIS — I5021 Acute systolic (congestive) heart failure: Secondary | ICD-10-CM | POA: Diagnosis present

## 2022-08-10 DIAGNOSIS — I130 Hypertensive heart and chronic kidney disease with heart failure and stage 1 through stage 4 chronic kidney disease, or unspecified chronic kidney disease: Secondary | ICD-10-CM | POA: Diagnosis present

## 2022-08-10 DIAGNOSIS — I10 Essential (primary) hypertension: Secondary | ICD-10-CM | POA: Diagnosis present

## 2022-08-10 DIAGNOSIS — I509 Heart failure, unspecified: Secondary | ICD-10-CM

## 2022-08-10 DIAGNOSIS — I739 Peripheral vascular disease, unspecified: Secondary | ICD-10-CM | POA: Diagnosis not present

## 2022-08-10 DIAGNOSIS — I252 Old myocardial infarction: Secondary | ICD-10-CM | POA: Diagnosis not present

## 2022-08-10 DIAGNOSIS — I2510 Atherosclerotic heart disease of native coronary artery without angina pectoris: Secondary | ICD-10-CM | POA: Diagnosis present

## 2022-08-10 LAB — CBC
HCT: 29.9 % — ABNORMAL LOW (ref 39.0–52.0)
Hemoglobin: 9.4 g/dL — ABNORMAL LOW (ref 13.0–17.0)
MCH: 30.6 pg (ref 26.0–34.0)
MCHC: 31.4 g/dL (ref 30.0–36.0)
MCV: 97.4 fL (ref 80.0–100.0)
Platelets: 310 10*3/uL (ref 150–400)
RBC: 3.07 MIL/uL — ABNORMAL LOW (ref 4.22–5.81)
RDW: 16 % — ABNORMAL HIGH (ref 11.5–15.5)
WBC: 7.7 10*3/uL (ref 4.0–10.5)
nRBC: 0 % (ref 0.0–0.2)

## 2022-08-10 LAB — BASIC METABOLIC PANEL
Anion gap: 9 (ref 5–15)
BUN: 27 mg/dL — ABNORMAL HIGH (ref 8–23)
CO2: 17 mmol/L — ABNORMAL LOW (ref 22–32)
Calcium: 6.4 mg/dL — CL (ref 8.9–10.3)
Chloride: 114 mmol/L — ABNORMAL HIGH (ref 98–111)
Creatinine, Ser: 1.79 mg/dL — ABNORMAL HIGH (ref 0.61–1.24)
GFR, Estimated: 37 mL/min — ABNORMAL LOW (ref >60)
Glucose, Bld: 115 mg/dL — ABNORMAL HIGH (ref 70–99)
Potassium: 5 mmol/L (ref 3.5–5.1)
Sodium: 140 mmol/L (ref 135–145)

## 2022-08-10 LAB — TROPONIN I (HIGH SENSITIVITY)
Troponin I (High Sensitivity): 14 ng/L (ref <18)
Troponin I (High Sensitivity): 12 ng/L (ref <18)

## 2022-08-10 LAB — BRAIN NATRIURETIC PEPTIDE: B Natriuretic Peptide: 2530 pg/mL — ABNORMAL HIGH (ref 0.0–100.0)

## 2022-08-10 MED ORDER — FUROSEMIDE 10 MG/ML IJ SOLN
40.0000 mg | Freq: Once | INTRAMUSCULAR | Status: AC
  Administered 2022-08-10: 40 mg via INTRAVENOUS
  Filled 2022-08-10: qty 4

## 2022-08-10 NOTE — ED Notes (Signed)
Date and time results received: 08/10/22 2106 (use smartphrase ".now" to insert current time)  Test: calcium Critical Value: 6.4  Name of Provider Notified: [place]  Orders Received? Or Actions Taken?:

## 2022-08-10 NOTE — ED Provider Notes (Signed)
[facility] EMERGENCY DEPARTMENT Provider Note   CSN: [id number] Arrival date & time: 08/10/22  1724     History Chief Complaint  Patient presents with   Shortness of Breath    HPI SHAHZAD THOMANN , a 86 y.o. male  was evaluated in triage.  Pt complains of shortness of breath, leg swelling, fatigue over the last 2 weeks.  No history of similar.  Denies history of heart failure.  He was seen by urgent care who said he had fluid on his lungs and needed to come in for further care and management.  He endorses nonproductive cough.  Major concern is dyspnea on exertion. Seen by PCP Review of Systems   Review of Systems  Constitutional:  Negative for chills and fever.  HENT:  Negative for ear pain and sore throat.   Eyes:  Negative for pain and visual disturbance.  Respiratory:  Positive for shortness of breath. Negative for cough.   Cardiovascular:  Negative for chest pain and palpitations.  Gastrointestinal:  Negative for abdominal pain and vomiting.  Genitourinary:  Negative for dysuria and hematuria.  Musculoskeletal:  Negative for arthralgias and back pain.  Skin:  Negative for color change and rash.  Neurological:  Negative for seizures and syncope.  All other systems reviewed and are negative.   Physical Exam Updated Vital Signs BP 134/78   Pulse 82   Temp 97.7 F (36.5 C) (Oral)   Resp (!) 30   Ht 5' 8.5" (1.74 m)   Wt 65.8 kg   SpO2 98%   BMI 21.73 kg/m  Physical Exam Vitals and nursing note reviewed.  Constitutional:      General: He is not in acute distress.    Appearance: He is well-developed.  HENT:     Head: Normocephalic and atraumatic.  Eyes:     Conjunctiva/sclera: Conjunctivae normal.  Cardiovascular:     Rate and Rhythm: Normal rate and regular rhythm.     Heart sounds: No murmur heard. Pulmonary:     Effort: Pulmonary effort is normal. No respiratory distress.     Breath sounds: Normal breath sounds.  Abdominal:     Palpations: Abdomen is  soft.     Tenderness: There is no abdominal tenderness.  Musculoskeletal:        General: No swelling.     Cervical back: Neck supple.  Skin:    General: Skin is warm and dry.     Capillary Refill: Capillary refill takes less than 2 seconds.  Neurological:     Mental Status: He is alert.  Psychiatric:        Mood and Affect: Mood normal.      ED Course/ Medical Decision Making/ A&P    Procedures Procedures   Medications Ordered in ED Medications  furosemide (LASIX) injection 40 mg (40 mg Intravenous Given 08/10/22 2213)    Medical Decision Making:    TREYLON HENARD is a 86 y.o. male who presented to the ED today with shortness of breath, weight gain, fatigue detailed above.     Patient's presentation is complicated by their history of advanced age, multiple comorbid medical problems.  Patient placed on continuous vitals and telemetry monitoring while in ED which was reviewed periodically.   Complete initial physical exam performed, notably the patient  was hemodynamically stable in no acute distress.      Reviewed and confirmed nursing documentation for past medical history, family history, social history.    Initial Assessment:   With the  patient's presentation of shortness of breath, most likely diagnosis is volume overload and new diagnosis heart failure.   This is most consistent with an acute life/limb threatening illness complicated by underlying chronic conditions.  Initial Plan:  Screening labs including CBC and Metabolic panel to evaluate for infectious or metabolic etiology of disease.  CXR to evaluate for structural/infectious intrathoracic pathology.  BNP/Trop/EKG to evaluate for cardiac pathology. Objective evaluation as below reviewed with plan for close reassessment  Initial Study Results:   Laboratory  All laboratory results reviewed without evidence of clinically relevant pathology.   EKG EKG was reviewed independently. Rate, rhythm, axis,  intervals all examined and without medically relevant abnormality. ST segments without concerns for elevations.    Radiology  All images reviewed independently. Agree with radiology report at this time.   DG Chest 2 View  Result Date: 08/10/2022 CLINICAL DATA:  Shortness of breath. EXAM: CHEST - 2 VIEW COMPARISON:  Chest radiograph dated 08/03/2022. FINDINGS: There is cardiomegaly with vascular congestion and edema. Small bilateral pleural effusions and bibasilar atelectasis or infiltrate. No pneumothorax. Median sternotomy wires and CABG vascular clips. No acute osseous pathology. IMPRESSION: Cardiomegaly with CHF and small bilateral pleural effusions. Superimposed pneumonia is not excluded. Electronically Signed   By: Anner Crete M.D.   On: 08/10/2022 18:03     Final Assessment and Plan:   Given elevated BNP, shortness of breath, findings of pleural effusion, favor developing heart failure exacerbation and new diagnosis heart failure.  Stable on room air, treated with diuretic with some symptomatic improvement.  Favor patient's going to need ongoing care and management in the inpatient setting given new diagnosis and ongoing symptoms at his age.  Patient and drink for admission, accepted in admission by hospitalist.    Clinical Impression:  1. SOB (shortness of breath)      Admit   Final Clinical Impression(s) / ED Diagnoses Final diagnoses:  SOB (shortness of breath)    Rx / DC Orders ED Discharge Orders     None         Tretha Sciara, MD 08/10/22 [phone]

## 2022-08-10 NOTE — ED Provider Notes (Signed)
[facility]    CSN: [id number] Arrival date & time: 08/10/22  1644      History   Chief Complaint No chief complaint on file.   HPI [name] is a 86 y.o. male.   Patient presents with son and wife for progressively worsening shortness of breath.  Reports starting last night, it became very difficult for him to lay flat while trying to sleep because of the shortness of breath.  He also endorses swelling around his knee and weakness.  No fevers, cough or congestion.  No abdominal pain, decreased appetite, nausea/vomiting, or diarrhea.  No chest pain or palpitations.  Patient has a significant medical history including history of permanent atrial fibrillation, history of CABG, left AKA, history of acute congestive heart failure to name a few.     Past Medical History:  Diagnosis Date   Arteriosclerotic cardiovascular disease (ASCVD)    CABG in 1990s; 2002 total obstruction of LAD, CX and RCA with patent grafts and nl EF; Stress nuc. 2008 - mild LV dilation; normal EF; questionable small anteroapical scar; no ischemia   Arthritis    "left knee" (11/08/2015)   Atrial fibrillation ([place])    Cellulitis 11/08/2015   "both feet"   Chronic anticoagulation    Dental crowns present    Diabetic peripheral neuropathy ([place])    bilateral lower leg, hands   History of blood transfusion    "related to my leg surgery?"   Hypertension    states under control with meds., has been on med. x "long time"   Iron deficiency anemia    Jaw cancer ([place]) 1990s   "squamous cell"   Myocardial infarction ([place]) 1990s   "after my jaw OR"   Pedal edema    "not a lot", per pt.   Peripheral vascular disease ([place])    Presence of retained hardware 07/2015   failed hardware jaw   Runny nose 07/27/2015   clear drainage, per pt.   Stroke ([place])    Type II diabetes mellitus ([place])    "diet controlled" (11/08/2015)    Patient Active Problem List   Diagnosis Date Noted   Hypokalemia  06/23/2022   Hypomagnesemia 06/23/2022   Hypocalcemia 06/23/2022   Hyperphosphatemia 06/23/2022   Pure hypercholesterolemia 08/22/2021   Stroke ([place]) 01/04/2019   Stroke (cerebrum) ([place]) 01/04/2019   Middle cerebral artery embolism, left (HCC) s/p mechanical thrombectomy 01/04/2019   Gastrointestinal hemorrhage    AKI (acute kidney injury) ([place])    Anemia 03/26/2018   Melena 02/21/2018   Upper GI bleed 02/13/2018   Coagulopathy ([place]) 02/13/2018   Weight gain 11/30/2015   Coronary artery disease involving coronary bypass graft of native heart without angina pectoris    Tachycardia    Leukocytosis    Abnormality of gait    Acute blood loss anemia    Acute pulmonary edema (HCC)    Acute congestive heart failure (HCC)    Acute respiratory failure with hypoxia (HCC)    Pressure ulcer 11/09/2015   Necrotic toes ([place]) 11/08/2015   Lower extremity cellulitis 11/08/2015   Bradycardia 05/28/2015   PVD (peripheral vascular disease) with claudication ([place]) 09/30/2014   Peripheral vascular disease ([place]) 08/06/2012   Chronic anticoagulation 01/20/2011   Diabetes mellitus ([place]) 03/04/2010   Dyslipidemia 03/04/2010   Essential hypertension 03/04/2010   Hx of CABG 03/04/2010   Permanent atrial fibrillation ([place]) 04/08/2009    Past Surgical History:  Procedure Laterality Date   ABDOMINAL AORTOGRAM W/LOWER EXTREMITY Bilateral 08/20/2020  Procedure: ABDOMINAL AORTOGRAM W/LOWER EXTREMITY;  Surgeon: Angelia Mould, MD;  Location: [place] [facility];  Service: Cardiovascular;  Laterality: Bilateral;   AMPUTATION Left 11/11/2015   Procedure: LEFT ABOVE KNEE AMPUTATION;  Surgeon: Angelia Mould, MD;  Location: [place];  Service: Vascular;  Laterality: Left;   CARDIAC CATHETERIZATION  1990s; 04/16/2001   CATARACT EXTRACTION W/ INTRAOCULAR LENS  IMPLANT, BILATERAL Bilateral 09/2015   COLONOSCOPY N/A 11/27/2014   Procedure: COLONOSCOPY;  Surgeon: Rogene Houston, MD;  [facility];  Service: Endoscopy;  Laterality: N/A;  830   CORONARY ARTERY BYPASS GRAFT  1990's   "CABG X4"   ESOPHAGOGASTRODUODENOSCOPY N/A 02/14/2018   Procedure: ESOPHAGOGASTRODUODENOSCOPY (EGD);  Surgeon: Rogene Houston, MD;  [facility];  Service: Endoscopy;  Laterality: N/A;   ESOPHAGOGASTRODUODENOSCOPY (EGD) WITH PROPOFOL N/A 02/25/2018   Procedure: ESOPHAGOGASTRODUODENOSCOPY (EGD) WITH PROPOFOL;  Surgeon: Rogene Houston, MD;  [facility];  Service: Endoscopy;  Laterality: N/A;   FRACTURE SURGERY     GIVENS CAPSULE STUDY N/A 02/15/2018   Procedure: GIVENS CAPSULE STUDY;  Surgeon: Rogene Houston, MD;  [facility];  Service: Endoscopy;  Laterality: N/A;   GIVENS CAPSULE STUDY N/A 03/27/2018   Procedure: GIVENS CAPSULE STUDY;  Surgeon: Rogene Houston, MD;  [facility];  Service: Endoscopy;  Laterality: N/A;   I & D EXTREMITY Left 11/10/2015   Procedure: INCISION AND DRAINAGE LEFT FOOT, AMPUTATION OF LEFT THIRD TOE;  Surgeon: Conrad Nocatee, MD;  Location: [place];  Service: Vascular;  Laterality: Left;   INCISION AND DRAINAGE ABSCESS Left 06/16/2005   wide exc. abscess 5th toe   IR ANGIOGRAM VISCERAL SELECTIVE  03/28/2018   IR ANGIOGRAM VISCERAL SELECTIVE  03/28/2018   IR CT HEAD LTD  01/04/2019   IR PERCUTANEOUS ART THROMBECTOMY/INFUSION INTRACRANIAL INC DIAG ANGIO  01/04/2019   IR US GUIDE VASC ACCESS LEFT  03/28/2018   MANDIBULAR HARDWARE REMOVAL Left 08/02/2015   Procedure:  HARDWARE REMOVAL TWO MANDIBULAR SCREWS;  Surgeon: Jannette Fogo, DDS;  Location: [place];  Service: Oral Surgery;  Laterality: Left;   ORIF TIBIA FRACTURE Left ~ [place] N/A 08/14/2016   Procedure: Abdominal Aortogram w/Lower Extremity;  Surgeon: Angelia Mould, MD;  Location: [place] [facility];  Service: Cardiovascular;  Laterality: N/A;   PERIPHERAL VASCULAR INTERVENTION Bilateral 08/20/2020   Procedure: PERIPHERAL  VASCULAR INTERVENTION;  Surgeon: Angelia Mould, MD;  Location: [place] [facility];  Service: Cardiovascular;  Laterality: Bilateral;  iliac stents   RADIOLOGY WITH ANESTHESIA N/A 01/04/2019   Procedure: CODE STROKE;  Surgeon: Radiologist, Medication, MD;  Location: [place];  Service: Radiology;  Laterality: N/A;   SQUAMOUS CELL CARCINOMA EXCISION Left 1993   "took my jaw out; got cadavar in there now"   TONSILLECTOMY         Home Medications    Prior to Admission medications   Medication Sig Start Date End Date Taking? Authorizing Provider  acetaminophen (TYLENOL) 325 MG tablet Take 2 tablets (650 mg total) by mouth every 6 (six) hours as needed for moderate pain. Patient taking differently: Take 1,000 mg by mouth every 6 (six) hours as needed for moderate pain. 11/27/21   Jaynee Eagles, PA-C  ammonium lactate (AMLACTIN) 12 % cream Apply topically 2 (two) times daily. 06/14/22   [provider]  apixaban (ELIQUIS) 2.5 MG TABS tablet Take 1 tablet (2.5 mg total) by mouth  2 (two) times daily. 06/27/22   Barton Dubois, MD  calcium carbonate (OS-CAL - DOSED IN MG OF ELEMENTAL CALCIUM) 1250 (500 Ca) MG tablet Take 1 tablet (1,250 mg total) by mouth daily with breakfast. 06/28/22 08/27/22  Barton Dubois, MD  carboxymethylcellul-glycerin (OPTIVE) 0.5-0.9 % ophthalmic solution Place 1 drop into both eyes daily as needed for dry eyes.    [provider]  cetirizine (ZYRTEC) 10 MG tablet Take 10 mg by mouth daily.    [provider]  diphenoxylate-atropine (LOMOTIL) 2.5-0.025 MG tablet Take 1 tablet by mouth 4 (four) times daily as needed for diarrhea or loose stools. 06/27/22 06/27/23  Barton Dubois, MD  feeding supplement (BOOST / RESOURCE BREEZE) LIQD Take 1 Container by mouth 2 (two) times daily between meals. 06/27/22   Barton Dubois, MD  halobetasol (ULTRAVATE) 0.05 % cream Apply topically. 11/18/20   [provider]  lovastatin (MEVACOR) 10 MG tablet Take 10 mg by  mouth daily.     [provider]  nebivolol (BYSTOLIC) 10 MG tablet Take 10 mg by mouth daily.    [provider]  nystatin cream (MYCOSTATIN) Apply 1 application topically 3 (three) times daily as needed (skin irritation/rash.).     [provider]  Omega-3 Fatty Acids (FISH OIL) 1200 MG CAPS Take 1,200 mg by mouth daily.    [provider]  Pediatric Multivitamins-Iron (FLINTSTONES PLUS IRON PO) Take 1 tablet by mouth daily.    [provider]  potassium chloride SA (KLOR-CON M) 20 MEQ tablet Take 2 tablets (40 mEq total) by mouth daily. 07/25/22   Jerline Pain, MD  Probiotic Product (PHILLIPS COLON HEALTH PO) Take 1 capsule by mouth daily.     [provider]  silver sulfADIAZINE (SILVADENE) 1 % cream Apply 1 application topically daily. 01/08/19   Donzetta Starch, NP  sodium bicarbonate 650 MG tablet Take 2 tablets (1,300 mg total) by mouth 2 (two) times daily. 06/27/22 08/26/22  Barton Dubois, MD    Family History Family History  Problem Relation Age of Onset   Heart disease Mother     Social History Social History   Tobacco Use   Smoking status: Never   Smokeless tobacco: Never  Vaping Use   Vaping Use: Never used  Substance Use Topics   Alcohol use: No    Alcohol/week: 0.0 standard drinks of alcohol   Drug use: No     Allergies   Lipitor [atorvastatin], Simvastatin, Penicillins, and Sulfa antibiotics   Review of Systems Review of Systems Per HPI  Physical Exam Triage Vital Signs ED Triage Vitals  Enc Vitals Group     BP 08/10/22 1651 138/83     Pulse Rate 08/10/22 1651 (!) 103     Resp --      Temp 08/10/22 1651 97.7 F (36.5 C)     Temp Source 08/10/22 1651 Oral     SpO2 08/10/22 1651 96 %     Weight --      Height --      Head Circumference --      Peak Flow --      Pain Score 08/10/22 1655 0     Pain Loc --      Pain Edu? --      Excl. in [place]? --    No data found.  Updated Vital Signs BP 138/83  (BP Location: Right Arm)   Pulse (!) 103   Temp 97.7 F (36.5 C) (Oral)  SpO2 96%   Visual Acuity Right Eye Distance:   Left Eye Distance:   Bilateral Distance:    Right Eye Near:   Left Eye Near:    Bilateral Near:     Physical Exam Vitals and nursing note reviewed.  Constitutional:      General: He is not in acute distress.    Appearance: Normal appearance. He is not toxic-appearing.  HENT:     Mouth/Throat:     Mouth: Mucous membranes are moist.     Pharynx: Oropharynx is clear.  Cardiovascular:     Rate and Rhythm: Normal rate. Rhythm irregular.  Pulmonary:     Effort: Tachypnea present. No respiratory distress.     Breath sounds: Rales present.  Musculoskeletal:     Right lower leg: 2+ Pitting Edema present.     Right ankle: Swelling present.     Comments: Left AKA  Skin:    Capillary Refill: Capillary refill takes less than 2 seconds.     Coloration: Skin is pale.  Neurological:     Mental Status: He is alert and oriented to person, place, and time.  Psychiatric:        Behavior: Behavior is cooperative.      UC Treatments / Results  Labs (all labs ordered are listed, but only abnormal results are displayed) Labs Reviewed - No data to display  EKG   Radiology No results found.  Procedures Procedures (including critical care time)  Medications Ordered in UC Medications - No data to display  Initial Impression / Assessment and Plan / UC Course  I have reviewed the triage vital signs and the nursing notes.  Pertinent labs & imaging results that were available during my care of the patient were reviewed by me and considered in my medical decision making (see chart for details).   Patient is well-appearing, normotensive, afebrile, not tachycardic, not tachypneic, oxygenating well on room air.  Initially in triage, he is slightly tachycardic.  Lower extremity edema Shortness of breath History of acute heart failure EKG today shows atrial  fibrillation-rate is controlled On examination, patient has congested lung sounds and rales in bilateral lobes I am concerned for acute heart failure exacerbation Recommended further evaluation and management in the emergency room Patient, wife, and son are in agreement to this plan and agreed to take him directly to the hospital Patient is safe to transport via private vehicle at this time  The patient was given the opportunity to ask questions.  All questions answered to their satisfaction.  The patient is in agreement to this plan.    Final Clinical Impressions(s) / UC Diagnoses   Final diagnoses:  Lower extremity edema  Shortness of breath  History of acute heart failure     Discharge Instructions      Please go directly to the ER for further evaluation of your symptoms    ED Prescriptions   None    PDMP not reviewed this encounter.   Eulogio Bear, NP 08/10/22 1710

## 2022-08-10 NOTE — ED Triage Notes (Signed)
Pt states he has fluid on his lungs.  Denies any CP.  + SOB with exertion over past 2 weeks.  C/o right leg is weak.

## 2022-08-10 NOTE — Telephone Encounter (Signed)
Pt's son in office today to discuss father. Son states that pt is c/o SOB with transfers and while lying down over the last 2 weeks. SOB is worse today. Son is unsure if pt has had any weight gain. He denies chest pain at this time. Pt does have some swelling of the knee but now swelling of the ankle. Please advise. Son Elta [place] can be reached at [phone].

## 2022-08-10 NOTE — ED Provider Triage Note (Signed)
Emergency Medicine Provider Triage Evaluation Note  [name] , a 86 y.o. male  was evaluated in triage.  Pt complains of shortness of breath, leg swelling, fatigue over the last 2 weeks.  No history of similar.  Denies history of heart failure.  He was seen by urgent care who said he had fluid on his lungs and needed to come in for further care and management.  He endorses nonproductive cough.  Major concern is dyspnea on exertion.  Review of Systems  Positive: Shortness of breath Negative: Fever  Physical Exam  BP 130/79 (BP Location: Left Arm)   Pulse 71   Temp 97.9 F (36.6 C) (Oral)   Resp 18   Ht 5' 8.5" (1.74 m)   Wt 65.8 kg   SpO2 98%   BMI 21.73 kg/m  Gen:   Awake, no distress   Resp:  Normal effort  MSK:   Moves extremities without difficulty.  Status post LLE AKA Other:    Medical Decision Making  Medically screening exam initiated at 8:04 PM.  Appropriate orders placed.  [name] was informed that the remainder of the evaluation will be completed by another provider, this initial triage assessment does not replace that evaluation, and the importance of remaining in the ED until their evaluation is complete.     Tretha Sciara, MD 08/10/22 2005

## 2022-08-10 NOTE — ED Triage Notes (Signed)
SOB on exertion x 2 weeks and while laying down flat.  Worse over the past 2 days.  Right knee swelling x 2 weeks.  States right leg has gotten weaker.  Denies chest pain

## 2022-08-10 NOTE — H&P (Signed)
History and Physical    Patient: [name] PPI:[id number] DOB: [birth year] DOA: 08/10/2022 DOS: the patient was seen and examined on 08/11/2022 PCP: Lemmie Evens, MD  Patient coming from: Home  Chief Complaint:  Chief Complaint  Patient presents with   Shortness of Breath   HPI: [name] is a 86 y.o. male with medical history significant of A-fib, on Eliquis, recurrent GI bleed, hypertension, hyperlipidemia, history of CAD status post CABG, peripheral vascular disease status post left AKA due to 2-week onset of increasing weakness, leg swelling and shortness of breath which rapidly worsened within the last 2 to 3 days and last night, he had difficulty in being able to lay flat in bed due to shortness of breath.  He denies abdominal pain, chest pain, fever, chills, nausea or vomiting or diarrhea.  ED Course:  In the emergency department, he was tachypneic, BP was 138/90, other vital signs were within normal range.  Work-up in the ED showed normocytic anemia, BMP showed sodium 140, potassium 4.0, chloride 114, bicarb 17, blood glucose 115, BUN 27, creatinine 1.79 (creatinine unstrained within 2.3-4.1 last month), calcium 6.4 Chest x-ray showed cardiomegaly with CHF and small bilateral pleural effusions.  Superimposed pneumonia is not excluded. IV Lasix 40 mg x 1 was given.  Hospitalist was asked to admit patient for further evaluation and management.  Review of Systems: Review of systems as noted in the HPI. All other systems reviewed and are negative.   Past Medical History:  Diagnosis Date   Arteriosclerotic cardiovascular disease (ASCVD)    CABG in 1990s; 2002 total obstruction of LAD, CX and RCA with patent grafts and nl EF; Stress nuc. 2008 - mild LV dilation; normal EF; questionable small anteroapical scar; no ischemia   Arthritis    "left knee" (11/08/2015)   Atrial fibrillation ([place])    Cellulitis 11/08/2015   "both feet"   Chronic anticoagulation    Dental crowns  present    Diabetic peripheral neuropathy ([place])    bilateral lower leg, hands   History of blood transfusion    "related to my leg surgery?"   Hypertension    states under control with meds., has been on med. x "long time"   Iron deficiency anemia    Jaw cancer ([place]) 1990s   "squamous cell"   Myocardial infarction ([place]) 1990s   "after my jaw OR"   Pedal edema    "not a lot", per pt.   Peripheral vascular disease ([place])    Presence of retained hardware 07/2015   failed hardware jaw   Runny nose 07/27/2015   clear drainage, per pt.   Stroke ([place])    Type II diabetes mellitus ([place])    "diet controlled" (11/08/2015)   Past Surgical History:  Procedure Laterality Date   ABDOMINAL AORTOGRAM W/LOWER EXTREMITY Bilateral 08/20/2020   Procedure: ABDOMINAL AORTOGRAM W/LOWER EXTREMITY;  Surgeon: Angelia Mould, MD;  Location: [facility];  Service: Cardiovascular;  Laterality: Bilateral;   AMPUTATION Left 11/11/2015   Procedure: LEFT ABOVE KNEE AMPUTATION;  Surgeon: Angelia Mould, MD;  Location: [facility];  Service: Vascular;  Laterality: Left;   CARDIAC CATHETERIZATION  1990s; 04/16/2001   CATARACT EXTRACTION W/ INTRAOCULAR LENS  IMPLANT, BILATERAL Bilateral 09/2015   COLONOSCOPY N/A 11/27/2014   Procedure: COLONOSCOPY;  Surgeon: Rogene Houston, MD;  [facility];  Service: Endoscopy;  Laterality: N/A;  830   CORONARY ARTERY BYPASS GRAFT  1990's   "CABG X4"   ESOPHAGOGASTRODUODENOSCOPY  N/A 02/14/2018   Procedure: ESOPHAGOGASTRODUODENOSCOPY (EGD);  Surgeon: Rogene Houston, MD;  [facility];  Service: Endoscopy;  Laterality: N/A;   ESOPHAGOGASTRODUODENOSCOPY (EGD) WITH PROPOFOL N/A 02/25/2018   Procedure: ESOPHAGOGASTRODUODENOSCOPY (EGD) WITH PROPOFOL;  Surgeon: Rogene Houston, MD;  [facility];  Service: Endoscopy;  Laterality: N/A;   FRACTURE SURGERY     GIVENS CAPSULE STUDY N/A 02/15/2018   Procedure: GIVENS CAPSULE STUDY;  Surgeon:  Rogene Houston, MD;  [facility];  Service: Endoscopy;  Laterality: N/A;   GIVENS CAPSULE STUDY N/A 03/27/2018   Procedure: GIVENS CAPSULE STUDY;  Surgeon: Rogene Houston, MD;  [facility];  Service: Endoscopy;  Laterality: N/A;   I & D EXTREMITY Left 11/10/2015   Procedure: INCISION AND DRAINAGE LEFT FOOT, AMPUTATION OF LEFT THIRD TOE;  Surgeon: Conrad Bruno, MD;  Location: [facility];  Service: Vascular;  Laterality: Left;   INCISION AND DRAINAGE ABSCESS Left 06/16/2005   wide exc. abscess 5th toe   IR ANGIOGRAM VISCERAL SELECTIVE  03/28/2018   IR ANGIOGRAM VISCERAL SELECTIVE  03/28/2018   IR CT HEAD LTD  01/04/2019   IR PERCUTANEOUS ART THROMBECTOMY/INFUSION INTRACRANIAL INC DIAG ANGIO  01/04/2019   IR US GUIDE VASC ACCESS LEFT  03/28/2018   MANDIBULAR HARDWARE REMOVAL Left 08/02/2015   Procedure:  HARDWARE REMOVAL TWO MANDIBULAR SCREWS;  Surgeon: Jannette Fogo, DDS;  Location: [facility];  Service: Oral Surgery;  Laterality: Left;   ORIF TIBIA FRACTURE Left ~ [place] N/A 08/14/2016   Procedure: Abdominal Aortogram w/Lower Extremity;  Surgeon: Angelia Mould, MD;  Location: [facility];  Service: Cardiovascular;  Laterality: N/A;   PERIPHERAL VASCULAR INTERVENTION Bilateral 08/20/2020   Procedure: PERIPHERAL VASCULAR INTERVENTION;  Surgeon: Angelia Mould, MD;  Location: [facility] [place] [facility];  Service: Cardiovascular;  Laterality: Bilateral;  iliac stents   RADIOLOGY WITH ANESTHESIA N/A 01/04/2019   Procedure: CODE STROKE;  Surgeon: Radiologist, Medication, MD;  Location: [facility];  Service: Radiology;  Laterality: N/A;   SQUAMOUS CELL CARCINOMA EXCISION Left 1993   "took my jaw out; got cadavar in there now"   TONSILLECTOMY      Social History:  reports that he has never smoked. He has never used smokeless tobacco. He reports that he does not drink alcohol and does not use drugs.   Allergies  Allergen  Reactions   Lipitor [Atorvastatin] Other (See Comments)    SEVERE HEADACHE   Simvastatin Other (See Comments)    MUSCLE ACHES   Penicillins Rash    Has patient had a PCN reaction causing immediate rash, facial/tongue/throat swelling, SOB or lightheadedness with hypotension: Yes Has patient had a PCN reaction causing severe rash involving mucus membranes or skin necrosis: No Has patient had a PCN reaction that required hospitalization No Has patient had a PCN reaction occurring within the last 10 years: No If all of the above answers are "NO", then may proceed with Cephalosporin use.    Sulfa Antibiotics Rash    Tolerates silver sulfadiazine cream at home    Family History  Problem Relation Age of Onset   Heart disease Mother      Prior to Admission medications   Medication Sig Start Date End Date Taking? Authorizing Provider  acetaminophen (TYLENOL) 325 MG tablet Take 2 tablets (650 mg total) by mouth every 6 (six) hours as needed for moderate pain. Patient taking differently: Take 1,000 mg by mouth  every 6 (six) hours as needed for moderate pain. 11/27/21   Jaynee Eagles, PA-C  ammonium lactate (AMLACTIN) 12 % cream Apply topically 2 (two) times daily. 06/14/22   [provider]  apixaban (ELIQUIS) 2.5 MG TABS tablet Take 1 tablet (2.5 mg total) by mouth 2 (two) times daily. 06/27/22   Barton Dubois, MD  calcium carbonate (OS-CAL - DOSED IN MG OF ELEMENTAL CALCIUM) 1250 (500 Ca) MG tablet Take 1 tablet (1,250 mg total) by mouth daily with breakfast. 06/28/22 08/27/22  Barton Dubois, MD  carboxymethylcellul-glycerin (OPTIVE) 0.5-0.9 % ophthalmic solution Place 1 drop into both eyes daily as needed for dry eyes.    [provider]  cetirizine (ZYRTEC) 10 MG tablet Take 10 mg by mouth daily.    [provider]  diphenoxylate-atropine (LOMOTIL) 2.5-0.025 MG tablet Take 1 tablet by mouth 4 (four) times daily as needed for diarrhea or loose stools. 06/27/22 06/27/23  Barton Dubois, MD  feeding supplement (BOOST / RESOURCE BREEZE) LIQD Take 1 Container by mouth 2 (two) times daily between meals. 06/27/22   Barton Dubois, MD  halobetasol (ULTRAVATE) 0.05 % cream Apply topically. 11/18/20   [provider]  lovastatin (MEVACOR) 10 MG tablet Take 10 mg by mouth daily.     [provider]  nebivolol (BYSTOLIC) 10 MG tablet Take 10 mg by mouth daily.    [provider]  nystatin cream (MYCOSTATIN) Apply 1 application topically 3 (three) times daily as needed (skin irritation/rash.).     [provider]  Omega-3 Fatty Acids (FISH OIL) 1200 MG CAPS Take 1,200 mg by mouth daily.    [provider]  Pediatric Multivitamins-Iron (FLINTSTONES PLUS IRON PO) Take 1 tablet by mouth daily.    [provider]  potassium chloride SA (KLOR-CON M) 20 MEQ tablet Take 2 tablets (40 mEq total) by mouth daily. 07/25/22   Jerline Pain, MD  Probiotic Product (PHILLIPS COLON HEALTH PO) Take 1 capsule by mouth daily.     [provider]  silver sulfADIAZINE (SILVADENE) 1 % cream Apply 1 application topically daily. 01/08/19   Donzetta Starch, NP  sodium bicarbonate 650 MG tablet Take 2 tablets (1,300 mg total) by mouth 2 (two) times daily. 06/27/22 08/26/22  Barton Dubois, MD    Physical Exam: BP (!) 144/72   Pulse 64   Temp 97.9 F (36.6 C) (Oral)   Resp 18   Ht 5' 8.5" (1.74 m)   Wt 65.8 kg   SpO2 97%   BMI 21.73 kg/m   General: 86 y.o. year-old male well developed well nourished in no acute distress.  Alert and oriented x3. HEENT: NCAT, EOMI Neck: Supple, trachea medial Cardiovascular: Regular rate and rhythm with no rubs or gallops.  No thyromegaly or JVD noted.  +2 right lower extremity edema. 2/4 pulses in all 4 extremities. Respiratory: Tachypnea.  Diffuse Rales in lower lobes.   Abdomen: Soft, nontender nondistended with normal bowel sounds x4 quadrants. Muskuloskeletal: Left AKA.  No cyanosis or clubbing noted on  RLE Neuro: CN II-XII intact, sensation, reflexes intact Skin: No ulcerative lesions noted or rashes Psychiatry: Judgement and insight appear normal. Mood is appropriate for condition and setting          Labs on Admission:  Basic Metabolic Panel: Recent Labs  Lab 08/10/22 2025  NA 140  K 5.0  CL 114*  CO2 17*  GLUCOSE 115*  BUN 27*  CREATININE 1.79*  CALCIUM 6.4*   Liver Function  Tests: No results for input(s): "AST", "ALT", "ALKPHOS", "BILITOT", "PROT", "ALBUMIN" in the last 168 hours. No results for input(s): "LIPASE", "AMYLASE" in the last 168 hours. No results for input(s): "AMMONIA" in the last 168 hours. CBC: Recent Labs  Lab 08/10/22 2025  WBC 7.7  HGB 9.4*  HCT 29.9*  MCV 97.4  PLT 310   Cardiac Enzymes: No results for input(s): "CKTOTAL", "CKMB", "CKMBINDEX", "TROPONINI" in the last 168 hours.  BNP (last 3 results) Recent Labs    08/10/22 2025  BNP 2,530.0*    ProBNP (last 3 results) No results for input(s): "PROBNP" in the last 8760 hours.  CBG: No results for input(s): "GLUCAP" in the last 168 hours.  Radiological Exams on Admission: DG Chest 2 View  Result Date: 08/10/2022 CLINICAL DATA:  Shortness of breath. EXAM: CHEST - 2 VIEW COMPARISON:  Chest radiograph dated 08/03/2022. FINDINGS: There is cardiomegaly with vascular congestion and edema. Small bilateral pleural effusions and bibasilar atelectasis or infiltrate. No pneumothorax. Median sternotomy wires and CABG vascular clips. No acute osseous pathology. IMPRESSION: Cardiomegaly with CHF and small bilateral pleural effusions. Superimposed pneumonia is not excluded. Electronically Signed   By: Anner Crete M.D.   On: 08/10/2022 18:03    EKG: I independently viewed the EKG done and my findings are as followed: Personally reviewed showed A-fib with RVR  with premature ventricular or aberrantly conducted complexes and prolonged QTc 508 ms.  Assessment/Plan Present on Admission:   Hypocalcemia  PVD (peripheral vascular disease) with claudication (HCC)  Permanent atrial fibrillation (HCC)  Essential hypertension  Principal Problem:   Congestive heart failure (CHF) (HCC) Active Problems:   Essential hypertension   Permanent atrial fibrillation (HCC)   PVD (peripheral vascular disease) with claudication (HCC)   Hypocalcemia   Prolonged QT interval   Mixed hyperlipidemia  Congestive heart failure Patient denies prior history of CHF BNP 2530 Continue total input/output, daily weights and fluid restriction Continue IV Lasix 40 twice daily Continue heart healthy diet  Echocardiogram in the morning  Cardiology will be consulted in the morning and we shall await further recommendations  Questionable superimposed pneumonia on CHF Chest x-ray was suggestive of superimposed pneumonia on CHF Patient's shortness of breath appears to be related to fluid overload Procalcitonin will be checked prior to starting antibiotics  Prolonged QT interval QTc 508 ms Avoid QT prolonging drugs Magnesium level will be checked Continue telemetry  CKD 3A Creatinine 1.79 (creatinine has ranged within 2.3-4.1 last month) Renally adjust medications, avoid nephrotoxic agents/dehydration/hypotension  ?Hypocalcemia Ca 6.4; albumin level will be checked to help correlate fracture or calcium level  Peripheral vascular disease s/p left AKA Stable, patient will continue to follow up with outpatient vascular surgery   Hx of CAD s/p CABG Patient denies chest pain   Permanent atrial fibrillation with RVR Continue Eliquis for secondary prevention   Essential hypertension Continue Bystolic  Mixed hyperlipidemia Continue Pravachol   DVT prophylaxis: Eliquis  Code Status: Full code  Consults: Cardiology  Family Communication: Wife at bedside (all questions answered to satisfaction)  Severity of Illness: The appropriate patient status for this patient is INPATIENT. Inpatient  status is judged to be reasonable and necessary in order to provide the required intensity of service to ensure the patient's safety. The patient's presenting symptoms, physical exam findings, and initial radiographic and laboratory data in the context of their chronic comorbidities is felt to place them at high risk for further clinical deterioration. Furthermore, it is not anticipated that the patient will  be medically stable for discharge from the hospital within 2 midnights of admission.   * I certify that at the point of admission it is my clinical judgment that the patient will require inpatient hospital care spanning beyond 2 midnights from the point of admission due to high intensity of service, high risk for further deterioration and high frequency of surveillance required.*  Author: Bernadette Hoit, DO 08/11/2022 5:40 AM  For on call review [email or web address].

## 2022-08-10 NOTE — ED Notes (Signed)
Patient is being discharged from the Urgent Care and sent to the Emergency Department via private vehicle . Per NP, patient is in need of higher level of care due to SOB. Patient is aware and verbalizes understanding of plan of care.  Vitals:   08/10/22 1651  BP: 138/83  Pulse: (!) 103  Temp: 97.7 F (36.5 C)  SpO2: 96%

## 2022-08-10 NOTE — Discharge Instructions (Addendum)
Please go directly to the ER for further evaluation of your symptoms

## 2022-08-11 ENCOUNTER — Inpatient Hospital Stay (HOSPITAL_COMMUNITY): Payer: Medicare Other

## 2022-08-11 DIAGNOSIS — I25810 Atherosclerosis of coronary artery bypass graft(s) without angina pectoris: Secondary | ICD-10-CM

## 2022-08-11 DIAGNOSIS — I5031 Acute diastolic (congestive) heart failure: Secondary | ICD-10-CM

## 2022-08-11 DIAGNOSIS — R9431 Abnormal electrocardiogram [ECG] [EKG]: Secondary | ICD-10-CM

## 2022-08-11 DIAGNOSIS — I10 Essential (primary) hypertension: Secondary | ICD-10-CM | POA: Diagnosis not present

## 2022-08-11 DIAGNOSIS — E782 Mixed hyperlipidemia: Secondary | ICD-10-CM

## 2022-08-11 DIAGNOSIS — I5030 Unspecified diastolic (congestive) heart failure: Secondary | ICD-10-CM | POA: Diagnosis not present

## 2022-08-11 DIAGNOSIS — I5041 Acute combined systolic (congestive) and diastolic (congestive) heart failure: Secondary | ICD-10-CM

## 2022-08-11 LAB — COMPREHENSIVE METABOLIC PANEL
ALT: 28 U/L (ref 0–44)
AST: 25 U/L (ref 15–41)
Albumin: 2.7 g/dL — ABNORMAL LOW (ref 3.5–5.0)
Alkaline Phosphatase: 184 U/L — ABNORMAL HIGH (ref 38–126)
Anion gap: 9 (ref 5–15)
BUN: 30 mg/dL — ABNORMAL HIGH (ref 8–23)
CO2: 19 mmol/L — ABNORMAL LOW (ref 22–32)
Calcium: 6.4 mg/dL — CL (ref 8.9–10.3)
Chloride: 114 mmol/L — ABNORMAL HIGH (ref 98–111)
Creatinine, Ser: 1.82 mg/dL — ABNORMAL HIGH (ref 0.61–1.24)
GFR, Estimated: 36 mL/min — ABNORMAL LOW (ref >60)
Glucose, Bld: 106 mg/dL — ABNORMAL HIGH (ref 70–99)
Potassium: 4.5 mmol/L (ref 3.5–5.1)
Sodium: 142 mmol/L (ref 135–145)
Total Bilirubin: 0.7 mg/dL (ref 0.3–1.2)
Total Protein: 6.2 g/dL — ABNORMAL LOW (ref 6.5–8.1)

## 2022-08-11 LAB — ECHOCARDIOGRAM COMPLETE
AR max vel: 0.94 cm2
AV Area VTI: 0.91 cm2
AV Area mean vel: 0.88 cm2
AV Mean grad: 6 mmHg
AV Peak grad: 11.8 mmHg
Ao pk vel: 1.72 m/s
Area-P 1/2: 5.66 cm2
Height: 68.5 in
MV M vel: 4.91 m/s
MV Peak grad: 96.4 mmHg
P 1/2 time: 330 msec
Radius: 0.5 cm
S' Lateral: 5 cm
Weight: 2320 oz

## 2022-08-11 LAB — CBC
HCT: 27.2 % — ABNORMAL LOW (ref 39.0–52.0)
Hemoglobin: 8.5 g/dL — ABNORMAL LOW (ref 13.0–17.0)
MCH: 30 pg (ref 26.0–34.0)
MCHC: 31.3 g/dL (ref 30.0–36.0)
MCV: 96.1 fL (ref 80.0–100.0)
Platelets: 268 10*3/uL (ref 150–400)
RBC: 2.83 MIL/uL — ABNORMAL LOW (ref 4.22–5.81)
RDW: 15.8 % — ABNORMAL HIGH (ref 11.5–15.5)
WBC: 5.8 10*3/uL (ref 4.0–10.5)
nRBC: 0 % (ref 0.0–0.2)

## 2022-08-11 LAB — PROCALCITONIN: Procalcitonin: 0.11 ng/mL

## 2022-08-11 LAB — MAGNESIUM: Magnesium: 0.9 mg/dL — CL (ref 1.7–2.4)

## 2022-08-11 MED ORDER — FUROSEMIDE 10 MG/ML IJ SOLN
40.0000 mg | Freq: Two times a day (BID) | INTRAMUSCULAR | Status: DC
  Administered 2022-08-11 – 2022-08-14 (×7): 40 mg via INTRAVENOUS
  Filled 2022-08-11 (×7): qty 4

## 2022-08-11 MED ORDER — MAGNESIUM SULFATE 4 GM/100ML IV SOLN
4.0000 g | Freq: Once | INTRAVENOUS | Status: AC
  Administered 2022-08-11: 4 g via INTRAVENOUS
  Filled 2022-08-11: qty 100

## 2022-08-11 MED ORDER — NEBIVOLOL HCL 10 MG PO TABS
10.0000 mg | ORAL_TABLET | Freq: Every day | ORAL | Status: DC
  Administered 2022-08-11: 10 mg via ORAL
  Filled 2022-08-11 (×4): qty 1

## 2022-08-11 MED ORDER — PERFLUTREN LIPID MICROSPHERE
1.0000 mL | INTRAVENOUS | Status: AC | PRN
  Administered 2022-08-11: 3 mL via INTRAVENOUS

## 2022-08-11 MED ORDER — METOPROLOL SUCCINATE ER 25 MG PO TB24
25.0000 mg | ORAL_TABLET | Freq: Every day | ORAL | Status: DC
  Administered 2022-08-12 – 2022-08-14 (×2): 25 mg via ORAL
  Filled 2022-08-11 (×3): qty 1

## 2022-08-11 MED ORDER — APIXABAN 2.5 MG PO TABS
2.5000 mg | ORAL_TABLET | Freq: Two times a day (BID) | ORAL | Status: DC
  Administered 2022-08-11 – 2022-08-14 (×7): 2.5 mg via ORAL
  Filled 2022-08-11 (×7): qty 1

## 2022-08-11 MED ORDER — CALCIUM GLUCONATE-NACL 1-0.675 GM/50ML-% IV SOLN
1.0000 g | Freq: Once | INTRAVENOUS | Status: AC
  Administered 2022-08-11: 1000 mg via INTRAVENOUS
  Filled 2022-08-11: qty 50

## 2022-08-11 MED ORDER — PRAVASTATIN SODIUM 10 MG PO TABS
10.0000 mg | ORAL_TABLET | Freq: Every day | ORAL | Status: DC
  Administered 2022-08-11: 10 mg via ORAL
  Filled 2022-08-11: qty 1

## 2022-08-11 MED ORDER — OYSTER SHELL CALCIUM/D3 500-5 MG-MCG PO TABS
2.0000 | ORAL_TABLET | Freq: Two times a day (BID) | ORAL | Status: DC
  Administered 2022-08-11 – 2022-08-14 (×7): 2 via ORAL
  Filled 2022-08-11 (×7): qty 2

## 2022-08-11 NOTE — Consult Note (Signed)
CARDIOLOGY CONSULT NOTE    Patient ID: [name]; [id number]; December 08, 1935   Admit date: 08/10/2022 Date of Consult: 08/11/2022  Primary Care Provider: Lemmie Evens, MD Primary Cardiologist: Dr Marlou Porch Primary Electrophysiologist:  None   Patient Profile:   [name] is a 86 y.o. male with a hx of CAD s/p CABG who is being seen today for the evaluation of CHF at the request of Dr Malachi Carl.  History of Present Illness:   [name] is a 86 y/o M known to have CAD s/p CABG in 1990s with repeat Cath in 2002s showing patent grafts with normal LVEF,  PAD s/p CIA stents in 2021 and L AKA, permanent Afib on AC, HTN, DM2, GIB, chronic venous insufficiency presented to the ER with DOE and LE swelling x 2 weeks associated with weakness in RLE. Denied having any chest pain since CABG, palpitations, light-headedness, syncope and LE swelling. Compliant with medications and no side-effects. No bleeding complications. Denied smoking cigarettes. CXR showed cardiomegaly with b/l pleural effusions.He was given IV Lasix 84m x 1 on 10/19 PM and one more dose this morning. He urinated in 3 full urinals with relief in his symptoms.  Past Medical History:  Diagnosis Date   Arteriosclerotic cardiovascular disease (ASCVD)    CABG in 1990s; 2002 total obstruction of LAD, CX and RCA with patent grafts and nl EF; Stress nuc. 2008 - mild LV dilation; normal EF; questionable small anteroapical scar; no ischemia   Arthritis    "left knee" (11/08/2015)   Atrial fibrillation ([facility]    Cellulitis 11/08/2015   "both feet"   Chronic anticoagulation    Dental crowns present    Diabetic peripheral neuropathy ([place]    bilateral lower leg, hands   History of blood transfusion    "related to my leg surgery?"   Hypertension    states under control with meds., has been on med. x "long time"   Iron deficiency anemia    Jaw cancer ([place] 1990s   "squamous cell"   [place]ocardial infarction [place] 1990s   "after my  jaw OR"   Pedal edema    "not a lot", per pt.   Peripheral vascular disease ([place]    Presence of retained hardware 07/2015   failed hardware jaw   Runny nose 07/27/2015   clear drainage, per pt.   Stroke [place]    Type II diabetes mellitus ([place]    "diet controlled" (11/08/2015)    Past Surgical History:  Procedure Laterality Date   ABDOMINAL AORTOGRAM W/LOWER EXTREMITY Bilateral 08/20/2020   Procedure: ABDOMINAL AORTOGRAM W/LOWER EXTREMITY;  Surgeon: DAngelia Mould MD;  [facility];  Service: Cardiovascular;  Laterality: Bilateral;   AMPUTATION Left 11/11/2015   Procedure: LEFT ABOVE KNEE AMPUTATION;  Surgeon: CAngelia Mould MD;  [facility]  Service: Vascular;  Laterality: Left;   CARDIAC CATHETERIZATION  1990s; 04/16/2001   CATARACT EXTRACTION W/ INTRAOCULAR LENS  IMPLANT, BILATERAL Bilateral 09/2015   COLONOSCOPY N/A 11/27/2014   Procedure: COLONOSCOPY;  Surgeon: NRogene Houston MD;  [facility];  Service: Endoscopy;  Laterality: N/A;  830   CORONARY ARTERY BYPASS GRAFT  1990's   "CABG X4"   ESOPHAGOGASTRODUODENOSCOPY N/A 02/14/2018   Procedure: ESOPHAGOGASTRODUODENOSCOPY (EGD);  Surgeon: RRogene Houston MD;  [facility];  Service: Endoscopy;  Laterality: N/A;   ESOPHAGOGASTRODUODENOSCOPY (EGD) WITH PROPOFOL N/A 02/25/2018   Procedure: ESOPHAGOGASTRODUODENOSCOPY (EGD) WITH PROPOFOL;  Surgeon: RRogene Houston MD;  [facility];  Service: Endoscopy;  Laterality: N/A;   FRACTURE SURGERY     GIVENS CAPSULE STUDY N/A 02/15/2018   Procedure: GIVENS CAPSULE STUDY;  Surgeon: Rogene Houston, MD;  [facility];  Service: Endoscopy;  Laterality: N/A;   GIVENS CAPSULE STUDY N/A 03/27/2018   Procedure: GIVENS CAPSULE STUDY;  Surgeon: Rogene Houston, MD;  [facility];  Service: Endoscopy;  Laterality: N/A;   I & D EXTREMITY Left 11/10/2015   Procedure: INCISION AND DRAINAGE LEFT FOOT, AMPUTATION OF  LEFT THIRD TOE;  Surgeon: Conrad Dover, MD;  Location: [facility];  Service: Vascular;  Laterality: Left;   INCISION AND DRAINAGE ABSCESS Left 06/16/2005   wide exc. abscess 5th toe   IR ANGIOGRAM VISCERAL SELECTIVE  03/28/2018   IR ANGIOGRAM VISCERAL SELECTIVE  03/28/2018   IR CT HEAD LTD  01/04/2019   IR PERCUTANEOUS ART THROMBECTOMY/INFUSION INTRACRANIAL INC DIAG ANGIO  01/04/2019   IR US GUIDE VASC ACCESS LEFT  03/28/2018   MANDIBULAR HARDWARE REMOVAL Left 08/02/2015   Procedure:  HARDWARE REMOVAL TWO MANDIBULAR SCREWS;  Surgeon: Jannette Fogo, DDS;  Location: [facility];  Service: Oral Surgery;  Laterality: Left;   ORIF TIBIA FRACTURE Left ~ [place] N/A 08/14/2016   Procedure: Abdominal Aortogram w/Lower Extremity;  Surgeon: Angelia Mould, MD;  Location: [facility];  Service: Cardiovascular;  Laterality: N/A;   PERIPHERAL VASCULAR INTERVENTION Bilateral 08/20/2020   Procedure: PERIPHERAL VASCULAR INTERVENTION;  Surgeon: Angelia Mould, MD;  Location: [facility];  Service: Cardiovascular;  Laterality: Bilateral;  iliac stents   RADIOLOGY WITH ANESTHESIA N/A 01/04/2019   Procedure: CODE STROKE;  Surgeon: Radiologist, Medication, MD;  Location: [facility];  Service: Radiology;  Laterality: N/A;   SQUAMOUS CELL CARCINOMA EXCISION Left 1993   "took my jaw out; got cadavar in there now"   TONSILLECTOMY       Home Medications:  Prior to Admission medications   Medication Sig Start Date End Date Taking? Authorizing Provider  acetaminophen (TYLENOL) 325 MG tablet Take 2 tablets (650 mg total) by mouth every 6 (six) hours as needed for moderate pain. Patient taking differently: Take 1,000 mg by mouth every 6 (six) hours as needed for moderate pain. 11/27/21  Yes Jaynee Eagles, PA-C  ammonium lactate (AMLACTIN) 12 % cream Apply topically 2 (two) times daily. 06/14/22  Yes [provider]  apixaban (ELIQUIS) 2.5 MG TABS tablet Take  1 tablet (2.5 mg total) by mouth 2 (two) times daily. 06/27/22  Yes Barton Dubois, MD  calcium carbonate (OS-CAL - DOSED IN MG OF ELEMENTAL CALCIUM) 1250 (500 Ca) MG tablet Take 1 tablet (1,250 mg total) by mouth daily with breakfast. 06/28/22 08/27/22 Yes Barton Dubois, MD  carboxymethylcellul-glycerin (OPTIVE) 0.5-0.9 % ophthalmic solution Place 1 drop into both eyes daily as needed for dry eyes.   Yes [provider]  cetirizine (ZYRTEC) 10 MG tablet Take 10 mg by mouth daily.   Yes [provider]  feeding supplement (BOOST / RESOURCE BREEZE) LIQD Take 1 Container by mouth 2 (two) times daily between meals. 06/27/22  Yes Barton Dubois, MD  lovastatin (MEVACOR) 10 MG tablet Take 10 mg by mouth daily.    Yes [provider]  nebivolol (BYSTOLIC) 10 MG tablet Take 10 mg by mouth daily.   Yes [provider]  Omega-3 Fatty Acids (FISH OIL) 1200 MG CAPS Take 1,200 mg by mouth daily.  Yes [provider]  Pediatric Multivitamins-Iron (FLINTSTONES PLUS IRON PO) Take 1 tablet by mouth daily.   Yes [provider]  potassium chloride SA (KLOR-CON M) 20 MEQ tablet Take 2 tablets (40 mEq total) by mouth daily. 07/25/22  Yes Jerline Pain, MD  Probiotic Product (PHILLIPS COLON HEALTH PO) Take 1 capsule by mouth daily.    Yes [provider]  sodium bicarbonate 650 MG tablet Take 2 tablets (1,300 mg total) by mouth 2 (two) times daily. 06/27/22 08/26/22 Yes Barton Dubois, MD  diphenoxylate-atropine (LOMOTIL) 2.5-0.025 MG tablet Take 1 tablet by mouth 4 (four) times daily as needed for diarrhea or loose stools. 06/27/22 06/27/23  Barton Dubois, MD  halobetasol (ULTRAVATE) 0.05 % cream Apply topically. 11/18/20   [provider]  nystatin cream (MYCOSTATIN) Apply 1 application topically 3 (three) times daily as needed (skin irritation/rash.).     [provider]  silver sulfADIAZINE (SILVADENE) 1 % cream Apply 1 application topically daily.  01/08/19   Donzetta Starch, NP    Inpatient Medications: Scheduled Meds:  apixaban  2.5 mg Oral BID   calcium-vitamin D  2 tablet Oral BID   furosemide  40 mg Intravenous Q12H   nebivolol  10 mg Oral Daily   pravastatin  10 mg Oral q1800   Continuous Infusions:  magnesium sulfate bolus IVPB 4 g (08/11/22 0932)   PRN Meds: perflutren lipid microspheres (DEFINITY) IV suspension  Allergies:    Allergies  Allergen Reactions   Lipitor [Atorvastatin] Other (See Comments)    SEVERE HEADACHE   Simvastatin Other (See Comments)    MUSCLE ACHES   Penicillins Rash    Has patient had a PCN reaction causing immediate rash, facial/tongue/throat swelling, SOB or lightheadedness with hypotension: Yes Has patient had a PCN reaction causing severe rash involving mucus membranes or skin necrosis: No Has patient had a PCN reaction that required hospitalization No Has patient had a PCN reaction occurring within the last 10 years: No If all of the above answers are "NO", then may proceed with Cephalosporin use.    Sulfa Antibiotics Rash    Tolerates silver sulfadiazine cream at home    Social History:   Social History   Socioeconomic History   Marital status: Married    Spouse name: Not on file   Number of children: Not on file   Years of education: Not on file   Highest education level: Not on file  Occupational History   Not on file  Tobacco Use   Smoking status: Never   Smokeless tobacco: Never  Vaping Use   Vaping Use: Never used  Substance and Sexual Activity   Alcohol use: No    Alcohol/week: 0.0 standard drinks of alcohol   Drug use: No   Sexual activity: Yes  Other Topics Concern   Not on file  Social History Narrative   Not on file   Social Determinants of Health   Financial Resource Strain: Not on file  Food Insecurity: Not on file  Transportation Needs: Not on file  Physical Activity: Not on file  Stress: Not on file  Social Connections: Not on file  Intimate  Partner Violence: Not on file    Family History:   Family History  Problem Relation Age of Onset   Heart disease Mother      ROS:  Please see the history of present illness.  ROS  All other ROS reviewed and negative.     Physical Exam/Data:  Vitals:   08/11/22 0445 08/11/22 0548 08/11/22 0900 08/11/22 1051  BP:  133/82 135/86   Pulse: 77 72 72   Resp: 20 16 (!) 23   Temp:  (!) 97.5 F (36.4 C)  97.6 F (36.4 C)  TempSrc:  Oral  Oral  SpO2: 95% 96% 95%   Weight:      Height:        Intake/Output Summary (Last 24 hours) at 08/11/2022 1114 Last data filed at 08/11/2022 0706 Gross per 24 hour  Intake --  Output 1800 ml  Net -1800 ml   Filed Weights   08/10/22 1734  Weight: 65.8 kg   Body mass index is 21.73 kg/m.  General:  Well nourished, well developed, in no acute distress HEENT: normal Lymph: no adenopathy Neck:  JVD+ just below the angle of mandible Endocrine:  No thryomegaly Vascular: No carotid bruits; L AKA Cardiac:  normal S1, S2; RRR; no murmur  Lungs:  Bilateral fine rales Abd: soft, nontender, no hepatomegaly  Ext: no edema Musculoskeletal:  No deformities, BUE and BLE strength normal and equal Skin: warm and dry  Neuro:  CNs 2-12 intact, no focal abnormalities noted Psych:  Normal affect   EKG:  The EKG was personally reviewed and demonstrates:  Atrial fibrillation, rate related aberrancy, iLBBB Telemetry:  Telemetry was personally reviewed and demonstrates:  AFib  Relevant CV Studies: Echo in 2020 1. The left ventricle has a visually estimated ejection fraction of 55%.  The cavity size was normal. Left ventricular diastolic function could not  be evaluated secondary to atrial fibrillation.   2. The mitral valve is abnormal. Mild thickening of the mitral valve  leaflet.   3. The tricuspid valve is grossly normal.   4. The aortic valve is grossly normal. Mild thickening of the aortic  valve. Mild calcification of the aortic valve. Moderate  annular  calcification. Aortic valve regurgitation is mild by color flow Doppler.   5. The aortic root is normal in size and structure.   6. Left atrial size was severely dilated.   7. Right atrial size was moderately dilated.   8. The inferior vena cava was dilated in size with <50% respiratory  variability.  Laboratory Data:  Chemistry Recent Labs  Lab 08/10/22 2025 08/11/22 0521  NA 140 142  K 5.0 4.5  CL 114* 114*  CO2 17* 19*  GLUCOSE 115* 106*  BUN 27* 30*  CREATININE 1.79* 1.82*  CALCIUM 6.4* 6.4*  GFRNONAA 37* 36*  ANIONGAP 9 9    Recent Labs  Lab 08/11/22 0521  PROT 6.2*  ALBUMIN 2.7*  AST 25  ALT 28  ALKPHOS 184*  BILITOT 0.7   Hematology Recent Labs  Lab 08/10/22 2025 08/11/22 0521  WBC 7.7 5.8  RBC 3.07* 2.83*  HGB 9.4* 8.5*  HCT 29.9* 27.2*  MCV 97.4 96.1  MCH 30.6 30.0  MCHC 31.4 31.3  RDW 16.0* 15.8*  PLT 310 268   Cardiac EnzymesNo results for input(s): "TROPONINI" in the last 168 hours. No results for input(s): "TROPIPOC" in the last 168 hours.  BNP Recent Labs  Lab 08/10/22 2025  BNP 2,530.0*    DDimer No results for input(s): "DDIMER" in the last 168 hours.  Radiology/Studies:  DG Chest 2 View  Result Date: 08/10/2022 CLINICAL DATA:  Shortness of breath. EXAM: CHEST - 2 VIEW COMPARISON:  Chest radiograph dated 08/03/2022. FINDINGS: There is cardiomegaly with vascular congestion and edema. Small bilateral pleural effusions and bibasilar atelectasis or infiltrate.  No pneumothorax. Median sternotomy wires and CABG vascular clips. No acute osseous pathology. IMPRESSION: Cardiomegaly with CHF and small bilateral pleural effusions. Superimposed pneumonia is not excluded. Electronically Signed   By: Anner Crete M.D.   On: 08/10/2022 18:03    Assessment and Plan:  Patient is a 86 y/o M known to have CAD s/p CABG in 1990s with repeat Cath in 2002s showing patent grafts with normal LVEF,  PAD s/p CIA stents in 2021 and L AKA, permanent  Afib on AC, HTN, DM2, GIB, chronic venous insufficiency admitted to medicine team for CHF management. Cardiology is consulted for the same at the request of Dr Denton Brick.  #ADHF/Wet and Warm/New onset cardiomyopathy LVEF 20%/NYHA Class III/ACC AHA Stage C, currently decompensated PLAN -Review of Echo images today showed new onset cardiomyopathy, LVEF 20% -Continue IV Lasix 51m BID. Hold Lasix if K<3.5 and Mg<1. Check K and Mg Q12h while on Lasix -Switch Nebivolol to Metoprolol succinate 246monce daily -Start Entresto 24-2664mID -Patient will benefit from outpatient LHC to evaluate for graft patency -LifeVest upon discharge -Cardiac rehab referral upon discharge  #CAD s/p CABG #PAD s/p CIA stents and L AKA PLAN -Switch to his home medication, Lovastatin due to intolerance to high intensity statins. Patient will benefit from PCSK9 inhibitors after shared decision making. Follow up with his primary cardiologist.  #Permanent Afib PLAN -Switch Nebivolol to Metoprolol succinate 19m33mce daily -Continue Eliquis 2.5mg 69m  I have spent a total of 47 minutes with patient reviewing chart , telemetry, EKGs, labs and examining patient as well as establishing an assessment and plan that was discussed with the patient.  > 50% of time was spent in direct patient care.      For questions or updates, please contact [facility] consult [email or web address] for contact info under Cardiology/STEMI.   Signed, VishnVangie Bicker10/20/2023 11:14 AM

## 2022-08-11 NOTE — ED Notes (Signed)
Date and time results received: 08/11/22 0704 (use smartphrase ".now" to insert current time)  Test: Calcium Critical Value: 6.4  Name of Provider Notified: Dr. Denton Brick  Orders Received? Or Actions Taken?: see chart

## 2022-08-11 NOTE — Progress Notes (Signed)
   Notified by Dr. Dellia Cloud this patient needs a Life Vest prior to discharge. Appropriate form completed and supporting documents faxed over. Notified by [employer] he was approved. They will plan to place this prior to discharge.   Signed, Erma Heritage, PA-C 08/11/2022, 4:05 PM

## 2022-08-11 NOTE — Progress Notes (Signed)
*  PRELIMINARY RESULTS* Echocardiogram 2D Echocardiogram has been performed with Definity.  Samuel Germany 08/11/2022, 9:43 AM

## 2022-08-11 NOTE — ED Notes (Signed)
Date and time results received: 08/11/22 8:38 AM  (use smartphrase ".now" to insert current time)  Test: Magnesium Critical Value: 0.9  Name of Provider Notified: Dr. Denton Brick  Orders Received? Or Actions Taken?: see chart

## 2022-08-11 NOTE — TOC Progression Note (Signed)
  Transition of Care [facility]) Screening Note   Patient Details  Name: CLIMMIE BUELOW Date of Birth: Apr 26, 1936   Transition of Care [facility]) CM/SW Contact:    Boneta Lucks, RN Phone Number: 08/11/2022, 10:15 AM  Patient in ED waiting on bed. Will be here greater than 2 days.   Transition of Care Department [facility]) has reviewed patient and no TOC needs have been identified at this time. We will continue to monitor patient advancement through interdisciplinary progression rounds. If new patient transition needs arise, please place a TOC consult.     Barriers to Discharge: Continued Medical Work up  Expected Discharge Plan and Services        Living arrangements for the past 2 months: [place]

## 2022-08-11 NOTE — Progress Notes (Signed)
PROGRESS NOTE     [name], is a 86 y.o. male, DOB - May 08, 1936, DTO:[id number]  Admit date - 08/10/2022   Admitting Physician Bernadette Hoit, DO  Outpatient Primary MD for the patient is Lemmie Evens, MD  LOS - 1  Chief Complaint  Patient presents with   Shortness of Breath        Brief Narrative:  86 y.o. male with medical history significant of A-fib, on Eliquis, recurrent GI bleed, hypertension, hyperlipidemia, history of CAD status post CABG, peripheral vascular disease status post left AKA noted on 08/10/2022 with acute  systolic dysfunction CHF    -Assessment and Plan: 1)HFrEF--acute systolic dysfunction CHF/new onset cardiomyopathy -Patient with dyspnea, weight gain, edema and elevated BNP 2,530 -Echo from 08/11/2022 with EF of 20 to 25 % (EF was 55% back in March 2020), no significant aortic stenosis or mitral stenosis -Cardiology consult appreciated -Cardiology recommends starting Entresto -Continue IV Lasix -Switch beta-blocker to metoprolol XL -Patient will need LifeVest prior to discharge -Plan is for outpatient cardiac cath  2)H/o CAD--- prior CABG -Troponin 14 repeat troponin 12 -No ACS type symptoms at this time -Toprol-XL as above #1 -Lovastatin--- patient previously did not tolerate high-dose statins  3) hypomagnesemia/hypokalemia--- replace and recheck  4)AKI----acute kidney injury on CKD stage - IV -Creatinine slightly better than baseline suspect some degree of hemodilution in the setting of acute CHF renally adjust medications, avoid nephrotoxic agents / dehydration  / hypotension  5)PAD- prior left AKA/status post prior carotid endarterectomy- -on pravastatin and Eliquis  6) chronic atrial fibrillation--- continue Eliquis and metoprolol  7) chronic anemia--- watch closely while on Eliquis -No bleeding concerns at this time   Disposition/Need for in-Hospital Stay- patient unable to be discharged at this time due to --CHF requiring  IV diuresis and  close monitoring given extensive significant electrolyte abnormalities*  Status is: Inpatient   Disposition: The patient is from: Home              Anticipated d/c is to: Home              Anticipated d/c date is: 3 days              Patient currently is not medically stable to d/c. Barriers: Not Clinically Stable-   Code Status :  -  Code Status: Prior   Family Communication:  Discussed with wife at bedside  DVT Prophylaxis  :   - SCDs   SCDs Start: 08/11/22 1009 Place TED hose Start: 08/11/22 1009 apixaban (ELIQUIS) tablet 2.5 mg Start: 08/11/22 1000 apixaban (ELIQUIS) tablet 2.5 mg   Lab Results  Component Value Date   PLT 268 08/11/2022    Inpatient Medications  Scheduled Meds:  apixaban  2.5 mg Oral BID   calcium-vitamin D  2 tablet Oral BID   furosemide  40 mg Intravenous Q12H   nebivolol  10 mg Oral Daily   pravastatin  10 mg Oral q1800   Continuous Infusions: PRN Meds:.   Anti-infectives (From admission, onward)    None         Subjective: Jaecion Dempster today has no fevers, no emesis,  No chest pain,   -Shortness of breath persist\ -Wife at bedside, questions answered   Objective: Vitals:   08/11/22 1400 08/11/22 1641 08/11/22 1700 08/11/22 1759  BP: 138/88 (!) 132/57 134/62   Pulse: 64 (!) 29 (!) 53   Resp: 15 17 17    Temp:    (!) 97.4 F (36.3 C)  TempSrc:    Oral  SpO2: 99% 97% 99%   Weight:      Height:        Intake/Output Summary (Last 24 hours) at 08/11/2022 1843 Last data filed at 08/11/2022 1611 Gross per 24 hour  Intake --  Output 2850 ml  Net -2850 ml   Filed Weights   08/10/22 1734  Weight: 65.8 kg    Physical Exam  Gen:- Awake Alert,  in no apparent distress  HEENT:- [place].AT, No sclera icterus Neck-Supple Neck,No JVD,.  Lungs-diminished breath sounds, no wheezing  CV- S1, S2 normal, irregular , prior CABG scar Abd-  +ve B.Sounds, Abd Soft, No tenderness,    Extremity/Skin:- +ve Rt Leg  edema, pedal  pulses present on Rt Psych-affect is appropriate, oriented x3 Neuro-no new focal deficits, no tremors MSK-left BKA stump appears well-healed  Data Reviewed: I have personally reviewed following labs and imaging studies  CBC: Recent Labs  Lab 08/10/22 2025 08/11/22 0521  WBC 7.7 5.8  HGB 9.4* 8.5*  HCT 29.9* 27.2*  MCV 97.4 96.1  PLT 310 786   Basic Metabolic Panel: Recent Labs  Lab 08/10/22 2025 08/11/22 0521 08/11/22 0522  NA 140 142  --   K 5.0 4.5  --   CL 114* 114*  --   CO2 17* 19*  --   GLUCOSE 115* 106*  --   BUN 27* 30*  --   CREATININE 1.79* 1.82*  --   CALCIUM 6.4* 6.4*  --   MG  --   --  0.9*   GFR: Estimated Creatinine Clearance: 28.6 mL/min (A) (by C-G formula based on SCr of 1.82 mg/dL (H)). Liver Function Tests: Recent Labs  Lab 08/11/22 0521  AST 25  ALT 28  ALKPHOS 184*  BILITOT 0.7  PROT 6.2*  ALBUMIN 2.7*   Cardiac Enzymes: No results for input(s): "CKTOTAL", "CKMB", "CKMBINDEX", "TROPONINI" in the last 168 hours. BNP (last 3 results) No results for input(s): "PROBNP" in the last 8760 hours. HbA1C: No results for input(s): "HGBA1C" in the last 72 hours. Sepsis Labs: @LABRCNTIP (procalcitonin:4,lacticidven:4) )No results found for this or any previous visit (from the past 240 hour(s)).    Radiology Studies: ECHOCARDIOGRAM COMPLETE  Result Date: 08/11/2022    ECHOCARDIOGRAM REPORT   Patient Name:   [name] Date of Exam: 08/11/2022 Medical Rec #:  [id number]      Height:       68.5 in Accession #:    [id number]     Weight:       145.0 lb Date of Birth:  09-13-1936     BSA:          1.792 m Patient Age:    58 years       BP:           138/94 mmHg Patient Gender: M              HR:           79 bpm. Exam Location:  [facility] Procedure: 2D Echo, Cardiac Doppler and Color Doppler Indications:    CHF-Acute Diastolic T65.46  History:        Patient has prior history of Echocardiogram examinations, most                 recent 01/05/2019.  Prior CABG, Stroke, Arrythmias:Permanent                 atrial fibrillation; Risk Factors:Hypertension, Diabetes and  Dyslipidemia. Chronic anticoagulation, Coronary artery disease                 involving coronary bypass graft of native heart without angina                 pectoris.  Sonographer:    Alvino Chapel RCS Referring Phys: [id number] OLADAPO ADEFESO IMPRESSIONS  1. Left ventricular ejection fraction, by estimation, is 20 to 25%. The left ventricle has severely decreased function. The left ventricle demonstrates global hypokinesis. The left ventricular internal cavity size was mildly dilated. Elevated left atrial pressure.  2. Right ventricular systolic function is normal. The right ventricular size is normal.  3. The aortic valve is mildly sclerosed. Aortic valve regurgitation is mild. No aortic stenosis is present.  4. There is mild dilatation of the aortic root, measuring 42 mm.  5. The mitral valve is grossly normal. Mild mitral valve regurgitation. No evidence of mitral stenosis.  6. The inferior vena cava is dilated in size with <50% respiratory variability, suggesting right atrial pressure of 15 mmHg. FINDINGS  Left Ventricle: Left ventricular ejection fraction, by estimation, is 20 to 25%. The left ventricle has severely decreased function. The left ventricle demonstrates global hypokinesis. The left ventricular internal cavity size was mildly dilated. There is no left ventricular hypertrophy. Abnormal (paradoxical) septal motion, consistent with left bundle branch block. Left ventricular diastolic function could not be evaluated due to atrial fibrillation. Elevated left atrial pressure. Right Ventricle: The right ventricular size is normal. No increase in right ventricular wall thickness. Right ventricular systolic function is normal. Left Atrium: Left atrial size was severely dilated. Right Atrium: Right atrial size was moderately dilated. Pericardium: There is no evidence of  pericardial effusion. Mitral Valve: The mitral valve is grossly normal. Mild mitral valve regurgitation. No evidence of mitral valve stenosis. Tricuspid Valve: The tricuspid valve is grossly normal. Tricuspid valve regurgitation is not demonstrated. No evidence of tricuspid stenosis. Aortic Valve: The aortic valve is calcified. Aortic valve regurgitation is mild. Aortic regurgitation PHT measures 330 msec. No aortic stenosis is present. Aortic valve mean gradient measures 6.0 mmHg. Aortic valve peak gradient measures 11.8 mmHg. Aortic valve area, by VTI measures 0.91 cm. Pulmonic Valve: The pulmonic valve was not well visualized. Pulmonic valve regurgitation is not visualized. No evidence of pulmonic stenosis. Aorta: There is mild dilatation of the aortic root, measuring 42 mm. Venous: The inferior vena cava is dilated in size with less than 50% respiratory variability, suggesting right atrial pressure of 15 mmHg. IAS/Shunts: No atrial level shunt detected by color flow Doppler.  LEFT VENTRICLE PLAX 2D LVIDd:         6.10 cm LVIDs:         5.00 cm LV PW:         0.80 cm LV IVS:        0.90 cm LVOT diam:     1.80 cm LV SV:         34 LV SV Index:   19 LVOT Area:     2.54 cm  RIGHT VENTRICLE TAPSE (M-mode): 1.6 cm LEFT ATRIUM            Index        RIGHT ATRIUM           Index LA diam:      4.90 cm  2.73 cm/m   RA Area:     26.30 cm LA Vol (A4C): 143.0 ml 79.78 ml/m  RA Volume:  80.10 ml  44.69 ml/m  AORTIC VALVE AV Area (Vmax):    0.94 cm AV Area (Vmean):   0.88 cm AV Area (VTI):     0.91 cm AV Vmax:           172.00 cm/s AV Vmean:          116.000 cm/s AV VTI:            0.371 m AV Peak Grad:      11.8 mmHg AV Mean Grad:      6.0 mmHg LVOT Vmax:         63.30 cm/s LVOT Vmean:        40.300 cm/s LVOT VTI:          0.133 m LVOT/AV VTI ratio: 0.36 AI PHT:            330 msec  AORTA Ao Root diam: 4.20 cm MITRAL VALVE MV Area (PHT): 5.66 cm       SHUNTS MV Decel Time: 134 msec       Systemic VTI:  0.13 m MR  Peak grad:    96.4 mmHg    Systemic Diam: 1.80 cm MR Mean grad:    62.0 mmHg MR Vmax:         491.00 cm/s MR Vmean:        371.0 cm/s MR PISA:         1.57 cm MR PISA Eff ROA: 10 mm MR PISA Radius:  0.50 cm MV E velocity: 98.50 cm/s MV A velocity: 33.80 cm/s MV E/A ratio:  2.91 Vishnu Priya Mallipeddi Electronically signed by Lorelee Cover Mallipeddi Signature Date/Time: 08/11/2022/1:46:34 PM    Final    DG Chest 2 View  Result Date: 08/10/2022 CLINICAL DATA:  Shortness of breath. EXAM: CHEST - 2 VIEW COMPARISON:  Chest radiograph dated 08/03/2022. FINDINGS: There is cardiomegaly with vascular congestion and edema. Small bilateral pleural effusions and bibasilar atelectasis or infiltrate. No pneumothorax. Median sternotomy wires and CABG vascular clips. No acute osseous pathology. IMPRESSION: Cardiomegaly with CHF and small bilateral pleural effusions. Superimposed pneumonia is not excluded. Electronically Signed   By: Anner Crete M.D.   On: 08/10/2022 18:03     Scheduled Meds:  apixaban  2.5 mg Oral BID   calcium-vitamin D  2 tablet Oral BID   furosemide  40 mg Intravenous Q12H   nebivolol  10 mg Oral Daily   pravastatin  10 mg Oral q1800   Continuous Infusions:   LOS: 1 day    Roxan Hockey M.D on 08/11/2022 at 6:43 PM  Go to [email or web address] - for contact info  [facility] - Office  [phone]  If 7PM-7AM, please contact night-coverage [email or web address] 08/11/2022, 6:43 PM

## 2022-08-12 DIAGNOSIS — I10 Essential (primary) hypertension: Secondary | ICD-10-CM | POA: Diagnosis not present

## 2022-08-12 DIAGNOSIS — E782 Mixed hyperlipidemia: Secondary | ICD-10-CM | POA: Diagnosis not present

## 2022-08-12 DIAGNOSIS — I5030 Unspecified diastolic (congestive) heart failure: Secondary | ICD-10-CM | POA: Diagnosis not present

## 2022-08-12 LAB — RENAL FUNCTION PANEL
Albumin: 2.8 g/dL — ABNORMAL LOW (ref 3.5–5.0)
Anion gap: 9 (ref 5–15)
BUN: 36 mg/dL — ABNORMAL HIGH (ref 8–23)
CO2: 22 mmol/L (ref 22–32)
Calcium: 7.3 mg/dL — ABNORMAL LOW (ref 8.9–10.3)
Chloride: 107 mmol/L (ref 98–111)
Creatinine, Ser: 2.01 mg/dL — ABNORMAL HIGH (ref 0.61–1.24)
GFR, Estimated: 32 mL/min — ABNORMAL LOW (ref >60)
Glucose, Bld: 98 mg/dL (ref 70–99)
Phosphorus: 3.7 mg/dL (ref 2.5–4.6)
Potassium: 3.9 mmol/L (ref 3.5–5.1)
Sodium: 138 mmol/L (ref 135–145)

## 2022-08-12 LAB — CBC
HCT: 28.5 % — ABNORMAL LOW (ref 39.0–52.0)
Hemoglobin: 9 g/dL — ABNORMAL LOW (ref 13.0–17.0)
MCH: 29.8 pg (ref 26.0–34.0)
MCHC: 31.6 g/dL (ref 30.0–36.0)
MCV: 94.4 fL (ref 80.0–100.0)
Platelets: 292 10*3/uL (ref 150–400)
RBC: 3.02 MIL/uL — ABNORMAL LOW (ref 4.22–5.81)
RDW: 15.9 % — ABNORMAL HIGH (ref 11.5–15.5)
WBC: 7.2 10*3/uL (ref 4.0–10.5)
nRBC: 0 % (ref 0.0–0.2)

## 2022-08-12 MED ORDER — PRAVASTATIN SODIUM 10 MG PO TABS
20.0000 mg | ORAL_TABLET | Freq: Every day | ORAL | Status: DC
  Administered 2022-08-12 – 2022-08-13 (×2): 20 mg via ORAL
  Filled 2022-08-12 (×2): qty 2

## 2022-08-12 NOTE — Progress Notes (Signed)
PROGRESS NOTE     [name], is a 86 y.o. male, DOB - December 06, 1935, NIO:[id number]  Admit date - 08/10/2022   Admitting Physician Bernadette Hoit, DO  Outpatient Primary MD for the patient is Lemmie Evens, MD  LOS - 2  Chief Complaint  Patient presents with   Shortness of Breath        Brief Narrative:  86 y.o. male with medical history significant of A-fib, on Eliquis, recurrent GI bleed, hypertension, hyperlipidemia, history of CAD status post CABG, peripheral vascular disease status post left AKA noted on 08/10/2022 with acute  systolic dysfunction CHF    -Assessment and Plan: 1)HFrEF--acute systolic dysfunction CHF/new onset cardiomyopathy -Patient with dyspnea, weight gain, edema and elevated BNP 2,530 -Echo from 08/11/2022 with EF of 20 to 25 % (EF was 55% back in March 2020), no significant aortic stenosis or mitral stenosis -Cardiology consult appreciated --Continue Entresto -Continue IV Lasix -Switch beta-blocker to metoprolol XL -Patient will need LifeVest prior to discharge---please see #8 below -Plan is for outpatient cardiac cath --Voiding well---wife is emptying urine without allowing staff to measure amount of urine show inaccurate input and output charting   2)H/o CAD--- prior CABG -Troponin 14 repeat troponin 12 -No ACS type symptoms at this time -Toprol-XL as above #1 -Lovastatin--- patient previously did not tolerate high-dose statins  3) hypomagnesemia/hypokalemia--- replace and recheck  4)AKI----acute kidney injury on CKD stage - IV -Monitor renal function with diuresis renally adjust medications, avoid nephrotoxic agents / dehydration  / hypotension  5)PAD- prior left AKA/status post prior carotid endarterectomy- -on pravastatin and Eliquis  6) chronic atrial fibrillation--- continue Eliquis and metoprolol  7) chronic anemia--- watch closely while on Eliquis -No bleeding concerns at this time  8)Recurrent episodes of nonsustained V.  tach noted--- patient is usually unaware of this and asymptomatic typically -Patient will need LifeVest prior to discharge home see #1 above   Disposition/Need for in-Hospital Stay- patient unable to be discharged at this time due to --CHF requiring IV diuresis and  close monitoring given extensive significant electrolyte abnormalities*  Status is: Inpatient   Disposition: The patient is from: Home              Anticipated d/c is to: Home              Anticipated d/c date is: 2 days              Patient currently is not medically stable to d/c. Barriers: Not Clinically Stable-   Code Status :  -  Code Status: Prior   Family Communication:  Discussed with wife at bedside  DVT Prophylaxis  :   - SCDs   SCDs Start: 08/11/22 1009 Place TED hose Start: 08/11/22 1009 apixaban (ELIQUIS) tablet 2.5 mg Start: 08/11/22 1000 apixaban (ELIQUIS) tablet 2.5 mg   Lab Results  Component Value Date   PLT 292 08/12/2022    Inpatient Medications  Scheduled Meds:  apixaban  2.5 mg Oral BID   calcium-vitamin D  2 tablet Oral BID   furosemide  40 mg Intravenous Q12H   metoprolol succinate  25 mg Oral Daily   pravastatin  20 mg Oral q1800   Continuous Infusions: PRN Meds:.   Anti-infectives (From admission, onward)    None         Subjective: [name] today has no fevers, no emesis,  No chest pain,   -Shortness of breath improving -Wife at bedside, questions answered -Voiding well---wife is emptying urine without allowing  staff to measure amount of urine show inaccurate input and output charting -Recurrent episodes of nonsustained V. tach noted--- patient is usually unaware of this and asymptomatic typically   Objective: Vitals:   08/12/22 0500 08/12/22 0542 08/12/22 0945 08/12/22 1448  BP:  136/89 126/60 (!) 142/81  Pulse:  68 80 82  Resp:  20  20  Temp:  97.6 F (36.4 C)  98 F (36.7 C)  TempSrc:    Oral  SpO2:  98%  100%  Weight: 70 kg     Height:         Intake/Output Summary (Last 24 hours) at 08/12/2022 1926 Last data filed at 08/12/2022 1900 Gross per 24 hour  Intake --  Output 750 ml  Net -750 ml   Filed Weights   08/10/22 1734 08/12/22 0500  Weight: 65.8 kg 70 kg    Physical Exam  Gen:- Awake Alert,  in no apparent distress  HEENT:- [place].AT, No sclera icterus Neck-Supple Neck,No JVD,.  Lungs-diminished breath sounds, no wheezing  CV- S1, S2 normal, irregular , prior CABG scar Abd-  +ve B.Sounds, Abd Soft, No tenderness,    Extremity/Skin:- +ve Rt Leg  edema, pedal pulses present on Rt Psych-affect is appropriate, oriented x3 Neuro-no new focal deficits, no tremors MSK-left BKA stump appears well-healed  Data Reviewed: I have personally reviewed following labs and imaging studies  CBC: Recent Labs  Lab 08/10/22 2025 08/11/22 0521 08/12/22 0655  WBC 7.7 5.8 7.2  HGB 9.4* 8.5* 9.0*  HCT 29.9* 27.2* 28.5*  MCV 97.4 96.1 94.4  PLT 310 268 409   Basic Metabolic Panel: Recent Labs  Lab 08/10/22 2025 08/11/22 0521 08/11/22 0522 08/12/22 0655  NA 140 142  --  138  K 5.0 4.5  --  3.9  CL 114* 114*  --  107  CO2 17* 19*  --  22  GLUCOSE 115* 106*  --  98  BUN 27* 30*  --  36*  CREATININE 1.79* 1.82*  --  2.01*  CALCIUM 6.4* 6.4*  --  7.3*  MG  --   --  0.9*  --   PHOS  --   --   --  3.7   GFR: Estimated Creatinine Clearance: 27.4 mL/min (A) (by C-G formula based on SCr of 2.01 mg/dL (H)). Liver Function Tests: Recent Labs  Lab 08/11/22 0521 08/12/22 0655  AST 25  --   ALT 28  --   ALKPHOS 184*  --   BILITOT 0.7  --   PROT 6.2*  --   ALBUMIN 2.7* 2.8*   Radiology Studies: ECHOCARDIOGRAM COMPLETE  Result Date: 08/11/2022    ECHOCARDIOGRAM REPORT   Patient Name:   [name] Date of Exam: 08/11/2022 Medical Rec #:  [id number]      Height:       68.5 in Accession #:    [id number]     Weight:       145.0 lb Date of Birth:  1936-05-31     BSA:          1.792 m Patient Age:    46 years       BP:            138/94 mmHg Patient Gender: M              HR:           79 bpm. Exam Location:  [facility] Procedure: 2D Echo, Cardiac Doppler and Color Doppler Indications:  CHF-Acute Diastolic O27.74  History:        Patient has prior history of Echocardiogram examinations, most                 recent 01/05/2019. Prior CABG, Stroke, Arrythmias:Permanent                 atrial fibrillation; Risk Factors:Hypertension, Diabetes and                 Dyslipidemia. Chronic anticoagulation, Coronary artery disease                 involving coronary bypass graft of native heart without angina                 pectoris.  Sonographer:    Alvino Chapel RCS Referring Phys: [id number] OLADAPO ADEFESO IMPRESSIONS  1. Left ventricular ejection fraction, by estimation, is 20 to 25%. The left ventricle has severely decreased function. The left ventricle demonstrates global hypokinesis. The left ventricular internal cavity size was mildly dilated. Elevated left atrial pressure.  2. Right ventricular systolic function is normal. The right ventricular size is normal.  3. The aortic valve is mildly sclerosed. Aortic valve regurgitation is mild. No aortic stenosis is present.  4. There is mild dilatation of the aortic root, measuring 42 mm.  5. The mitral valve is grossly normal. Mild mitral valve regurgitation. No evidence of mitral stenosis.  6. The inferior vena cava is dilated in size with <50% respiratory variability, suggesting right atrial pressure of 15 mmHg. FINDINGS  Left Ventricle: Left ventricular ejection fraction, by estimation, is 20 to 25%. The left ventricle has severely decreased function. The left ventricle demonstrates global hypokinesis. The left ventricular internal cavity size was mildly dilated. There is no left ventricular hypertrophy. Abnormal (paradoxical) septal motion, consistent with left bundle branch block. Left ventricular diastolic function could not be evaluated due to atrial fibrillation. Elevated left atrial  pressure. Right Ventricle: The right ventricular size is normal. No increase in right ventricular wall thickness. Right ventricular systolic function is normal. Left Atrium: Left atrial size was severely dilated. Right Atrium: Right atrial size was moderately dilated. Pericardium: There is no evidence of pericardial effusion. Mitral Valve: The mitral valve is grossly normal. Mild mitral valve regurgitation. No evidence of mitral valve stenosis. Tricuspid Valve: The tricuspid valve is grossly normal. Tricuspid valve regurgitation is not demonstrated. No evidence of tricuspid stenosis. Aortic Valve: The aortic valve is calcified. Aortic valve regurgitation is mild. Aortic regurgitation PHT measures 330 msec. No aortic stenosis is present. Aortic valve mean gradient measures 6.0 mmHg. Aortic valve peak gradient measures 11.8 mmHg. Aortic valve area, by VTI measures 0.91 cm. Pulmonic Valve: The pulmonic valve was not well visualized. Pulmonic valve regurgitation is not visualized. No evidence of pulmonic stenosis. Aorta: There is mild dilatation of the aortic root, measuring 42 mm. Venous: The inferior vena cava is dilated in size with less than 50% respiratory variability, suggesting right atrial pressure of 15 mmHg. IAS/Shunts: No atrial level shunt detected by color flow Doppler.  LEFT VENTRICLE PLAX 2D LVIDd:         6.10 cm LVIDs:         5.00 cm LV PW:         0.80 cm LV IVS:        0.90 cm LVOT diam:     1.80 cm LV SV:         34 LV SV Index:   19 LVOT Area:  2.54 cm  RIGHT VENTRICLE TAPSE (M-mode): 1.6 cm LEFT ATRIUM            Index        RIGHT ATRIUM           Index LA diam:      4.90 cm  2.73 cm/m   RA Area:     26.30 cm LA Vol (A4C): 143.0 ml 79.78 ml/m  RA Volume:   80.10 ml  44.69 ml/m  AORTIC VALVE AV Area (Vmax):    0.94 cm AV Area (Vmean):   0.88 cm AV Area (VTI):     0.91 cm AV Vmax:           172.00 cm/s AV Vmean:          116.000 cm/s AV VTI:            0.371 m AV Peak Grad:      11.8  mmHg AV Mean Grad:      6.0 mmHg LVOT Vmax:         63.30 cm/s LVOT Vmean:        40.300 cm/s LVOT VTI:          0.133 m LVOT/AV VTI ratio: 0.36 AI PHT:            330 msec  AORTA Ao Root diam: 4.20 cm MITRAL VALVE MV Area (PHT): 5.66 cm       SHUNTS MV Decel Time: 134 msec       Systemic VTI:  0.13 m MR Peak grad:    96.4 mmHg    Systemic Diam: 1.80 cm MR Mean grad:    62.0 mmHg MR Vmax:         491.00 cm/s MR Vmean:        371.0 cm/s MR PISA:         1.57 cm MR PISA Eff ROA: 10 mm MR PISA Radius:  0.50 cm MV E velocity: 98.50 cm/s MV A velocity: 33.80 cm/s MV E/A ratio:  2.91 Vishnu Priya Mallipeddi Electronically signed by Lorelee Cover Mallipeddi Signature Date/Time: 08/11/2022/1:46:34 PM    Final      Scheduled Meds:  apixaban  2.5 mg Oral BID   calcium-vitamin D  2 tablet Oral BID   furosemide  40 mg Intravenous Q12H   metoprolol succinate  25 mg Oral Daily   pravastatin  20 mg Oral q1800   Continuous Infusions:   LOS: 2 days    Roxan Hockey M.D on 08/12/2022 at 7:26 PM  Go to [email or web address] - for contact info  [facility] - Office  [phone]  If 7PM-7AM, please contact night-coverage [email or web address] 08/12/2022, 7:26 PM

## 2022-08-12 NOTE — Progress Notes (Signed)
Patient admitted to unit accompanied by spouse, alert and oriented answers questions appropriately, no c/o pain or discomfort, fluids offered and consumed, assist x1 stand and pivot to [facility] and returned back to bed. Patient is currently resting quietly with eyes closed, telemetry in place, call bell in reach.

## 2022-08-12 NOTE — Progress Notes (Signed)
Patient alert and oriented. His wife is at bedside. Students providing most of patient's care. Patient's wife was sleeping in bedside chair during my round. Call bell within reach of patient.

## 2022-08-12 NOTE — Progress Notes (Signed)
Garnette with  [employer] is at bedside  for Cardiac Life Vest education with family. [facility] 24 hours line can be reached at [phone]. Bryson Corona Edd Fabian

## 2022-08-13 DIAGNOSIS — I5030 Unspecified diastolic (congestive) heart failure: Secondary | ICD-10-CM | POA: Diagnosis not present

## 2022-08-13 DIAGNOSIS — I4821 Permanent atrial fibrillation: Secondary | ICD-10-CM | POA: Diagnosis not present

## 2022-08-13 LAB — RENAL FUNCTION PANEL
Albumin: 2.8 g/dL — ABNORMAL LOW (ref 3.5–5.0)
Anion gap: 11 (ref 5–15)
BUN: 42 mg/dL — ABNORMAL HIGH (ref 8–23)
CO2: 23 mmol/L (ref 22–32)
Calcium: 7.4 mg/dL — ABNORMAL LOW (ref 8.9–10.3)
Chloride: 104 mmol/L (ref 98–111)
Creatinine, Ser: 1.93 mg/dL — ABNORMAL HIGH (ref 0.61–1.24)
GFR, Estimated: 34 mL/min — ABNORMAL LOW (ref >60)
Glucose, Bld: 97 mg/dL (ref 70–99)
Phosphorus: 3.7 mg/dL (ref 2.5–4.6)
Potassium: 3.9 mmol/L (ref 3.5–5.1)
Sodium: 138 mmol/L (ref 135–145)

## 2022-08-13 NOTE — Plan of Care (Signed)

## 2022-08-13 NOTE — Progress Notes (Signed)
PROGRESS NOTE     [name], is a 86 y.o. male, DOB - 1936/05/22, CVE:[id number]  Admit date - 08/10/2022   Admitting Physician [name] Hoit, DO  Outpatient Primary MD for the patient is [name] Evens, MD  LOS - 3  Chief Complaint  Patient presents with   Shortness of Breath        Brief Narrative:  87 y.o. male with medical history significant of A-fib, on Eliquis, recurrent GI bleed, hypertension, hyperlipidemia, history of CAD status post CABG, peripheral vascular disease status post left AKA noted on 08/10/2022 with acute  systolic dysfunction CHF    -Assessment and Plan: 1)HFrEF--acute systolic dysfunction CHF/new onset cardiomyopathy -Patient with dyspnea, weight gain, edema and elevated BNP 2,530 -Echo from 08/11/2022 with EF of 20 to 25 % (EF was 55% back in March 2020), no significant aortic stenosis or mitral stenosis -Cardiology consult appreciated --Continue Entresto -Continue IV Lasix -Switch beta-blocker to metoprolol XL ----please see #8 below -Plan is for outpatient cardiac cath 08/13/22- [employer], Garnette placed cardiac life vest patient on 08/12/2022  -Patient and wife verbalized understanding of how LifeVest should be worn -see #1 above Dyspnea on Exertion improving --- Voiding well, weight is trending   2)H/o CAD--- prior CABG -Troponin 14 repeat troponin 12 -No ACS type symptoms at this time -Toprol-XL as above #1 -Lovastatin--- patient previously did not tolerate high-dose statins  3) hypomagnesemia/hypokalemia--- replace and recheck  4)AKI----acute kidney injury on CKD stage - IV -Monitor renal function with diuresis renally adjust medications, avoid nephrotoxic agents / dehydration  / hypotension  5)PAD- prior left AKA/status post prior carotid endarterectomy- -on pravastatin and Eliquis  6) chronic atrial fibrillation--- continue Eliquis and metoprolol  7) chronic anemia--- watch closely while on Eliquis -No bleeding  concerns at this time  8)Recurrent episodes of nonsustained V. tach noted--- patient is usually unaware of this and asymptomatic typically [employer], Garnette placed cardiac life vest patient on 08/12/2022  -Patient and wife verbalized understanding of how LifeVest should be worn -see #1 above   Disposition/Need for in-Hospital Stay- patient unable to be discharged at this time due to --CHF requiring IV diuresis and  close monitoring given extensive significant electrolyte abnormalities* -Possible discharge home on 08/14/2022 with LifeVest if continues to improve with IV diuresis  Status is: Inpatient   Disposition: The patient is from: Home              Anticipated d/c is to: Home              Anticipated d/c date is: 1 day              Patient currently is not medically stable to d/c. Barriers: Not Clinically Stable-   Code Status :  -  Code Status: Prior   Family Communication:  Discussed with wife at bedside  DVT Prophylaxis  :   - SCDs   SCDs Start: 08/11/22 1009 Place TED hose Start: 08/11/22 1009 apixaban (ELIQUIS) tablet 2.5 mg Start: 08/11/22 1000 apixaban (ELIQUIS) tablet 2.5 mg   Lab Results  Component Value Date   PLT 292 08/12/2022    Inpatient Medications  Scheduled Meds:  apixaban  2.5 mg Oral BID   calcium-vitamin D  2 tablet Oral BID   furosemide  40 mg Intravenous Q12H   metoprolol succinate  25 mg Oral Daily   pravastatin  20 mg Oral q1800   Continuous Infusions: PRN Meds:.   Anti-infectives (From admission, onward)    None  Subjective: [name] today has no fevers, no emesis,  No chest pain,   - Patient and wife verbalized understanding of how LifeVest should be worn -Continues to void well -Continues to have episodes of bradycardia and nonsustained V. Tach -Dyspnea on exertion improving  Objective: Vitals:   08/12/22 1448 08/13/22 0006 08/13/22 0613 08/13/22 0822  BP: (!) 142/81 125/66 137/76   Pulse: 82 82 60 (!) 55   Resp: 20 16 18    Temp: 98 F (36.7 C) 98 F (36.7 C) (!) 97.4 F (36.3 C)   TempSrc: Oral Oral Oral   SpO2: 100% 100% 96%   Weight:   68.7 kg   Height:        Intake/Output Summary (Last 24 hours) at 08/13/2022 1016 Last data filed at 08/13/2022 0645 Gross per 24 hour  Intake --  Output 1800 ml  Net -1800 ml   Filed Weights   08/10/22 1734 08/12/22 0500 08/13/22 0613  Weight: 65.8 kg 70 kg 68.7 kg    Physical Exam  Gen:- Awake Alert,  in no apparent distress  HEENT:- [place].AT, No sclera icterus Neck-Supple Neck,No JVD,.  Lungs-diminished breath sounds, no wheezing  CV- S1, S2 normal, irregular , prior CABG scar Abd-  +ve B.Sounds, Abd Soft, No tenderness,    Extremity/Skin:- +ve Rt Leg  edema, pedal pulses present on Rt Psych-affect is appropriate, oriented x3 Neuro-no new focal deficits, no tremors MSK-left BKA stump appears well-healed  Data Reviewed: I have personally reviewed following labs and imaging studies  CBC: Recent Labs  Lab 08/10/22 2025 08/11/22 0521 08/12/22 0655  WBC 7.7 5.8 7.2  HGB 9.4* 8.5* 9.0*  HCT 29.9* 27.2* 28.5*  MCV 97.4 96.1 94.4  PLT 310 268 737   Basic Metabolic Panel: Recent Labs  Lab 08/10/22 2025 08/11/22 0521 08/11/22 0522 08/12/22 0655 08/13/22 0603  NA 140 142  --  138 138  K 5.0 4.5  --  3.9 3.9  CL 114* 114*  --  107 104  CO2 17* 19*  --  22 23  GLUCOSE 115* 106*  --  98 97  BUN 27* 30*  --  36* 42*  CREATININE 1.79* 1.82*  --  2.01* 1.93*  CALCIUM 6.4* 6.4*  --  7.3* 7.4*  MG  --   --  0.9*  --   --   PHOS  --   --   --  3.7 3.7   GFR: Estimated Creatinine Clearance: 28.2 mL/min (A) (by C-G formula based on SCr of 1.93 mg/dL (H)). Liver Function Tests: Recent Labs  Lab 08/11/22 0521 08/12/22 0655 08/13/22 0603  AST 25  --   --   ALT 28  --   --   ALKPHOS 184*  --   --   BILITOT 0.7  --   --   PROT 6.2*  --   --   ALBUMIN 2.7* 2.8* 2.8*   Radiology Studies: No results found.  Scheduled Meds:   apixaban  2.5 mg Oral BID   calcium-vitamin D  2 tablet Oral BID   furosemide  40 mg Intravenous Q12H   metoprolol succinate  25 mg Oral Daily   pravastatin  20 mg Oral q1800   Continuous Infusions:   LOS: 3 days    Roxan Hockey M.D on 08/13/2022 at 10:16 AM  Go to [email or web address] - for contact info  [facility] - Office  [phone]  If 7PM-7AM, please contact night-coverage [email or web address] 08/13/2022, 10:16 AM

## 2022-08-13 NOTE — Progress Notes (Signed)
At 2200 patient had episode of bradycardia with rate of 38. Pt denied symptoms. At [phone] patient had a 5 beat run of Vtach, patient asymptomatic. [employer], Garnette placed cardiac life vest on patient and educated patient and wife, Joycelyn Schmid. [employer] 24 hour line phone number is [phone]. This RN reached out to Dr. Josephine Cables for clarification for if patient should wear the life vest while admitted or when discharged. He recommended patient should wear it now. Life vest left in place.Bryson Corona Edd Fabian

## 2022-08-14 DIAGNOSIS — I5021 Acute systolic (congestive) heart failure: Secondary | ICD-10-CM

## 2022-08-14 DIAGNOSIS — I10 Essential (primary) hypertension: Secondary | ICD-10-CM | POA: Diagnosis not present

## 2022-08-14 DIAGNOSIS — I4821 Permanent atrial fibrillation: Secondary | ICD-10-CM | POA: Diagnosis not present

## 2022-08-14 LAB — MAGNESIUM: Magnesium: 1.5 mg/dL — ABNORMAL LOW (ref 1.7–2.4)

## 2022-08-14 LAB — BASIC METABOLIC PANEL
Anion gap: 13 (ref 5–15)
BUN: 44 mg/dL — ABNORMAL HIGH (ref 8–23)
CO2: 23 mmol/L (ref 22–32)
Calcium: 6.9 mg/dL — ABNORMAL LOW (ref 8.9–10.3)
Chloride: 101 mmol/L (ref 98–111)
Creatinine, Ser: 1.92 mg/dL — ABNORMAL HIGH (ref 0.61–1.24)
GFR, Estimated: 34 mL/min — ABNORMAL LOW (ref >60)
Glucose, Bld: 92 mg/dL (ref 70–99)
Potassium: 3.5 mmol/L (ref 3.5–5.1)
Sodium: 137 mmol/L (ref 135–145)

## 2022-08-14 MED ORDER — POTASSIUM CHLORIDE CRYS ER 20 MEQ PO TBCR: 40.0000 meq | EXTENDED_RELEASE_TABLET | Freq: Every day | ORAL | Status: DC

## 2022-08-14 MED ORDER — MAGNESIUM SULFATE 4 GM/100ML IV SOLN
4.0000 g | Freq: Once | INTRAVENOUS | Status: AC
  Administered 2022-08-14: 4 g via INTRAVENOUS
  Filled 2022-08-14: qty 100

## 2022-08-14 MED ORDER — SODIUM BICARBONATE 650 MG PO TABS: 1300.0000 mg | ORAL_TABLET | Freq: Two times a day (BID) | ORAL | 1 refills | Status: AC

## 2022-08-14 MED ORDER — MAGNESIUM OXIDE -MG SUPPLEMENT 400 (240 MG) MG PO TABS: 400.0000 mg | ORAL_TABLET | Freq: Every day | ORAL | Status: DC

## 2022-08-14 MED ORDER — OYSTER SHELL CALCIUM/D3 500-5 MG-MCG PO TABS: 2.0000 | ORAL_TABLET | Freq: Two times a day (BID) | ORAL | 2 refills | Status: AC

## 2022-08-14 MED ORDER — PRAVASTATIN SODIUM 20 MG PO TABS: 20.0000 mg | ORAL_TABLET | Freq: Every evening | ORAL | 3 refills | Status: DC

## 2022-08-14 MED ORDER — METOPROLOL SUCCINATE ER 25 MG PO TB24: 12.5000 mg | ORAL_TABLET | Freq: Every day | ORAL | Status: DC

## 2022-08-14 MED ORDER — POTASSIUM CHLORIDE CRYS ER 20 MEQ PO TBCR
40.0000 meq | EXTENDED_RELEASE_TABLET | Freq: Once | ORAL | Status: AC
  Administered 2022-08-14: 40 meq via ORAL
  Filled 2022-08-14: qty 2

## 2022-08-14 MED ORDER — DAPAGLIFLOZIN PROPANEDIOL 10 MG PO TABS: 10.0000 mg | ORAL_TABLET | Freq: Every day | ORAL | 3 refills | Status: DC

## 2022-08-14 MED ORDER — POTASSIUM CHLORIDE CRYS ER 20 MEQ PO TBCR: 40.0000 meq | EXTENDED_RELEASE_TABLET | Freq: Every day | ORAL | 3 refills | Status: DC

## 2022-08-14 MED ORDER — MAGNESIUM OXIDE -MG SUPPLEMENT 400 (240 MG) MG PO TABS: 400.0000 mg | ORAL_TABLET | Freq: Every day | ORAL | 3 refills | Status: AC

## 2022-08-14 MED ORDER — MAGNESIUM SULFATE 2 GM/50ML IV SOLN
2.0000 g | Freq: Once | INTRAVENOUS | Status: AC
  Administered 2022-08-14: 2 g via INTRAVENOUS
  Filled 2022-08-14: qty 50

## 2022-08-14 MED ORDER — FUROSEMIDE 40 MG PO TABS: 40.0000 mg | ORAL_TABLET | Freq: Every day | ORAL | 3 refills | Status: DC

## 2022-08-14 MED ORDER — METOPROLOL SUCCINATE ER 25 MG PO TB24: 12.5000 mg | ORAL_TABLET | Freq: Every day | ORAL | 3 refills | Status: DC

## 2022-08-14 MED ORDER — CALCIUM GLUCONATE-NACL 1-0.675 GM/50ML-% IV SOLN
1.0000 g | Freq: Once | INTRAVENOUS | Status: AC
  Administered 2022-08-14: 1000 mg via INTRAVENOUS
  Filled 2022-08-14: qty 50

## 2022-08-14 MED ORDER — DAPAGLIFLOZIN PROPANEDIOL 10 MG PO TABS
10.0000 mg | ORAL_TABLET | Freq: Every day | ORAL | Status: DC
  Administered 2022-08-14: 10 mg via ORAL
  Filled 2022-08-14: qty 1

## 2022-08-14 MED ORDER — FUROSEMIDE 40 MG PO TABS: 40.0000 mg | ORAL_TABLET | Freq: Every day | ORAL | Status: DC

## 2022-08-14 NOTE — Progress Notes (Signed)
F/u arranged 11/10 in [place] office, date OK per d/w MD.

## 2022-08-14 NOTE — Care Management Important Message (Signed)
Important Message  Patient Details  Name: [name] MRN: [id number] Date of Birth: [birth year]   Medicare Important Message Given:  Yes     Tommy Medal 08/14/2022, 12:26 PM

## 2022-08-14 NOTE — Discharge Summary (Signed)
[name], is a 86 y.o. male  DOB July 31, 1936  MRN [id number].  Admission date:  08/10/2022  Admitting Physician  Bernadette Hoit, DO  Discharge Date:  08/14/2022   Primary MD  Lemmie Evens, MD  Recommendations for primary care physician for things to follow:   1)Very low-salt diet advised 2)Weigh yourself daily, call if you gain more than 3 pounds in 1 day or more than 5 pounds in 1 week as your diuretic medications may need to be adjusted 3)Avoid ibuprofen/Advil/Aleve/Motrin/Goody Powders/Naproxen/BC powders/Meloxicam/Diclofenac/Indomethacin and other Nonsteroidal anti-inflammatory medications as these will make you more likely to bleed and can cause stomach ulcers, can also cause Kidney problems.  4)Repeat CBC and BMP, as well as serum magnesium and serum calcium blood tests in 1 week 5)Please keep your LifeVest on is much as possible as this will help to shock you and "restart" your Heart if you have an irregular heartbeat  Admission Diagnosis  SOB (shortness of breath) [R06.02] Congestive heart failure (CHF) (HCC) [I50.9]   Discharge Diagnosis  SOB (shortness of breath) [R06.02] Congestive heart failure (CHF) ([place]) [I50.9]    Principal Problem:   Congestive heart failure (CHF) ([place]) Active Problems:   Essential hypertension   Permanent atrial fibrillation (HCC)   PVD (peripheral vascular disease) with claudication (HCC)   Hypocalcemia   Prolonged QT interval   Mixed hyperlipidemia      Past Medical History:  Diagnosis Date   Arteriosclerotic cardiovascular disease (ASCVD)    CABG in 1990s; 2002 total obstruction of LAD, CX and RCA with patent grafts and nl EF; Stress nuc. 2008 - mild LV dilation; normal EF; questionable small anteroapical scar; no ischemia   Arthritis    "left knee" (11/08/2015)   Atrial fibrillation ([place])    Cellulitis 11/08/2015   "both feet"   Chronic  anticoagulation    Dental crowns present    Diabetic peripheral neuropathy ([place])    bilateral lower leg, hands   History of blood transfusion    "related to my leg surgery?"   Hypertension    states under control with meds., has been on med. x "long time"   Iron deficiency anemia    Jaw cancer ([place]) 1990s   "squamous cell"   Myocardial infarction ([place]) 1990s   "after my jaw OR"   Pedal edema    "not a lot", per pt.   Peripheral vascular disease ([place])    Presence of retained hardware 07/2015   failed hardware jaw   Runny nose 07/27/2015   clear drainage, per pt.   Stroke ([place])    Type II diabetes mellitus ([place])    "diet controlled" (11/08/2015)    Past Surgical History:  Procedure Laterality Date   ABDOMINAL AORTOGRAM W/LOWER EXTREMITY Bilateral 08/20/2020   Procedure: ABDOMINAL AORTOGRAM W/LOWER EXTREMITY;  Surgeon: Angelia Mould, MD;  Location: [facility];  Service: Cardiovascular;  Laterality: Bilateral;   AMPUTATION Left 11/11/2015   Procedure: LEFT ABOVE KNEE AMPUTATION;  Surgeon: Angelia Mould, MD;  Location: [facility]  OR;  Service: Vascular;  Laterality: Left;   CARDIAC CATHETERIZATION  1990s; 04/16/2001   CATARACT EXTRACTION W/ INTRAOCULAR LENS  IMPLANT, BILATERAL Bilateral 09/2015   COLONOSCOPY N/A 11/27/2014   Procedure: COLONOSCOPY;  Surgeon: Rogene Houston, MD;  [facility];  Service: Endoscopy;  Laterality: N/A;  830   CORONARY ARTERY BYPASS GRAFT  1990's   "CABG X4"   ESOPHAGOGASTRODUODENOSCOPY N/A 02/14/2018   Procedure: ESOPHAGOGASTRODUODENOSCOPY (EGD);  Surgeon: Rogene Houston, MD;  [facility];  Service: Endoscopy;  Laterality: N/A;   ESOPHAGOGASTRODUODENOSCOPY (EGD) WITH PROPOFOL N/A 02/25/2018   Procedure: ESOPHAGOGASTRODUODENOSCOPY (EGD) WITH PROPOFOL;  Surgeon: Rogene Houston, MD;  [facility];  Service: Endoscopy;  Laterality: N/A;   FRACTURE SURGERY     GIVENS CAPSULE STUDY N/A 02/15/2018   Procedure:  GIVENS CAPSULE STUDY;  Surgeon: Rogene Houston, MD;  [facility];  Service: Endoscopy;  Laterality: N/A;   GIVENS CAPSULE STUDY N/A 03/27/2018   Procedure: GIVENS CAPSULE STUDY;  Surgeon: Rogene Houston, MD;  [facility];  Service: Endoscopy;  Laterality: N/A;   I & D EXTREMITY Left 11/10/2015   Procedure: INCISION AND DRAINAGE LEFT FOOT, AMPUTATION OF LEFT THIRD TOE;  Surgeon: Conrad Mayfield, MD;  Location: [facility];  Service: Vascular;  Laterality: Left;   INCISION AND DRAINAGE ABSCESS Left 06/16/2005   wide exc. abscess 5th toe   IR ANGIOGRAM VISCERAL SELECTIVE  03/28/2018   IR ANGIOGRAM VISCERAL SELECTIVE  03/28/2018   IR CT HEAD LTD  01/04/2019   IR PERCUTANEOUS ART THROMBECTOMY/INFUSION INTRACRANIAL INC DIAG ANGIO  01/04/2019   IR US GUIDE VASC ACCESS LEFT  03/28/2018   MANDIBULAR HARDWARE REMOVAL Left 08/02/2015   Procedure:  HARDWARE REMOVAL TWO MANDIBULAR SCREWS;  Surgeon: Jannette Fogo, DDS;  Location: [facility];  Service: Oral Surgery;  Laterality: Left;   ORIF TIBIA FRACTURE Left ~ [place] N/A 08/14/2016   Procedure: Abdominal Aortogram w/Lower Extremity;  Surgeon: Angelia Mould, MD;  Location: [facility];  Service: Cardiovascular;  Laterality: N/A;   PERIPHERAL VASCULAR INTERVENTION Bilateral 08/20/2020   Procedure: PERIPHERAL VASCULAR INTERVENTION;  Surgeon: Angelia Mould, MD;  Location: [facility];  Service: Cardiovascular;  Laterality: Bilateral;  iliac stents   RADIOLOGY WITH ANESTHESIA N/A 01/04/2019   Procedure: CODE STROKE;  Surgeon: Radiologist, Medication, MD;  Location: [facility];  Service: Radiology;  Laterality: N/A;   SQUAMOUS CELL CARCINOMA EXCISION Left 1993   "took my jaw out; got cadavar in there now"   TONSILLECTOMY       HPI  from the history and physical done on the day of admission:   HPI: [name] is a 86 y.o. male with medical history significant of A-fib, on  Eliquis, recurrent GI bleed, hypertension, hyperlipidemia, history of CAD status post CABG, peripheral vascular disease status post left AKA due to 2-week onset of increasing weakness, leg swelling and shortness of breath which rapidly worsened within the last 2 to 3 days and last night, he had difficulty in being able to lay flat in bed due to shortness of breath.  He denies abdominal pain, chest pain, fever, chills, nausea or vomiting or diarrhea.   ED Course:  In the emergency department, he was tachypneic, BP was 138/90, other vital signs were within normal range.  Work-up in the ED showed normocytic anemia, BMP showed sodium 140, potassium 4.0, chloride 114, bicarb 17,  blood glucose 115, BUN 27, creatinine 1.79 (creatinine unstrained within 2.3-4.1 last month), calcium 6.4 Chest x-ray showed cardiomegaly with CHF and small bilateral pleural effusions.  Superimposed pneumonia is not excluded. IV Lasix 40 mg x 1 was given.  Hospitalist was asked to admit patient for further evaluation and management.   Review of Systems: Review of systems as noted in the HPI. All other systems reviewed and are negative.     Hospital Course:     Brief Narrative:  86 y.o. male with medical history significant of A-fib, on Eliquis, recurrent GI bleed, hypertension, hyperlipidemia, history of CAD status post CABG, peripheral vascular disease status post left AKA noted on 08/10/2022 with acute  systolic dysfunction CHF     -Assessment and Plan: 1)HFrEF--acute systolic dysfunction CHF/new onset cardiomyopathy -Patient with dyspnea, weight gain, edema and elevated BNP 2,530 -Echo from 08/11/2022 with EF of 20 to 25 % (EF was 55% back in March 2020), no significant aortic stenosis or mitral stenosis -Cardiology consult appreciated --Continue Entresto -Switched beta-blocker to metoprolol XL ----please see #8 below -Plan is for outpatient cardiac cath after further evaluation and pending renal function Zoll  Contractor, Garnette placed cardiac life vest patient on 08/12/2022  -Patient and wife verbalized understanding of how LifeVest should be worn -Treated with with Lasix -Weight scale is not accurate as patient has left AKA and is unable to stand on a standing scale -Clinically much improved -Okay to discharge home on oral Lasix     2)H/o CAD--- prior CABG -Troponin 14 repeat troponin 12 -No ACS type symptoms at this time -Toprol-XL as above #1 -Lovastatin--- patient previously did not tolerate high-dose statins   3) hypomagnesemia/hypokalemia--- replaced , repeat labs advised in a week or so with cardiology team or PCP   4)AKI----acute kidney injury on CKD stage - IV  -Renal function stable with diuresis renally adjust medications, avoid nephrotoxic agents / dehydration  / hypotension   5)PAD- prior left AKA/status post prior carotid endarterectomy- -on pravastatin and Eliquis   6) chronic atrial fibrillation--- continue Eliquis and metoprolol   7) chronic anemia--- watch closely while on Eliquis -No bleeding concerns at this time   8)Recurrent episodes of nonsustained V. tach noted--- patient is usually unaware of this and asymptomatic typically Zoll Contractor, Garnette placed cardiac life vest patient on 08/12/2022  -Patient and wife verbalized understanding of how LifeVest should be worn -see #1 above  Disposition: The patient is from: Home              Anticipated d/c is to: Home Discharge Condition: stable  Follow UP   Follow-up Information     Erma Heritage, PA-C Follow up.   Specialties: Physician Assistant, Cardiology Why: [facility] - [place] location - a cardiology follow-up has been arranged for you on Friday Sep 01, 2022 at 2:30 PM (Arrive by 2:15 PM). [place] is one of our PAs that works with Dr. Marlou Porch. Contact information: [address] [place] [address] [phone]                 Consults obtained - cardiology  Diet and  Activity recommendation:  As advised  Discharge Instructions    Discharge Instructions     Call MD for:  difficulty breathing, headache or visual disturbances   Complete by: As directed    Call MD for:  persistant dizziness or light-headedness   Complete by: As directed    Call MD for:  persistant nausea and vomiting  Complete by: As directed    Call MD for:  temperature >100.4   Complete by: As directed    Diet - low sodium heart healthy   Complete by: As directed    Discharge instructions   Complete by: As directed    1)Very low-salt diet advised 2)Weigh yourself daily, call if you gain more than 3 pounds in 1 day or more than 5 pounds in 1 week as your diuretic medications may need to be adjusted 3)Avoid ibuprofen/Advil/Aleve/Motrin/Goody Powders/Naproxen/BC powders/Meloxicam/Diclofenac/Indomethacin and other Nonsteroidal anti-inflammatory medications as these will make you more likely to bleed and can cause stomach ulcers, can also cause Kidney problems.  4)Repeat CBC and BMP, as well as serum magnesium and serum calcium blood tests in 1 week 5)Please keep your LifeVest on is much as possible as this will help to shock you and "restart" your Heart if you have an irregular heartbeat   Increase activity slowly   Complete by: As directed          Discharge Medications     Allergies as of 08/14/2022       Reactions   Lipitor [atorvastatin] Other (See Comments)   SEVERE HEADACHE   Simvastatin Other (See Comments)   MUSCLE ACHES   Penicillins Rash   Has patient had a PCN reaction causing immediate rash, facial/tongue/throat swelling, SOB or lightheadedness with hypotension: Yes Has patient had a PCN reaction causing severe rash involving mucus membranes or skin necrosis: No Has patient had a PCN reaction that required hospitalization No Has patient had a PCN reaction occurring within the last 10 years: No If all of the above answers are "NO", then may proceed with  Cephalosporin use.   Sulfa Antibiotics Rash   Tolerates silver sulfadiazine cream at home        Medication List     STOP taking these medications    calcium carbonate 1250 (500 Ca) MG tablet Commonly known as: OS-CAL - dosed in mg of elemental calcium   lovastatin 10 MG tablet Commonly known as: MEVACOR   nebivolol 10 MG tablet Commonly known as: BYSTOLIC       TAKE these medications    acetaminophen 325 MG tablet Commonly known as: Tylenol Take 2 tablets (650 mg total) by mouth every 6 (six) hours as needed for moderate pain. What changed: how much to take   ammonium lactate 12 % cream Commonly known as: AMLACTIN Apply topically 2 (two) times daily.   apixaban 2.5 MG Tabs tablet Commonly known as: Eliquis Take 1 tablet (2.5 mg total) by mouth 2 (two) times daily.   calcium-vitamin D 500-5 MG-MCG tablet Commonly known as: OSCAL WITH D Take 2 tablets by mouth 2 (two) times daily.   cetirizine 10 MG tablet Commonly known as: ZYRTEC Take 10 mg by mouth daily.   dapagliflozin propanediol 10 MG Tabs tablet Commonly known as: FARXIGA Take 1 tablet (10 mg total) by mouth daily.   diphenoxylate-atropine 2.5-0.025 MG tablet Commonly known as: Lomotil Take 1 tablet by mouth 4 (four) times daily as needed for diarrhea or loose stools.   feeding supplement Liqd Take 1 Container by mouth 2 (two) times daily between meals.   Fish Oil 1200 MG Caps Take 1,200 mg by mouth daily.   FLINTSTONES PLUS IRON PO Take 1 tablet by mouth daily.   furosemide 40 MG tablet Commonly known as: LASIX Take 1 tablet (40 mg total) by mouth daily. Start taking on: August 15, 2022   halobetasol 0.05 %  cream Commonly known as: ULTRAVATE Apply topically.   magnesium oxide 400 (240 Mg) MG tablet Commonly known as: MAG-OX Take 1 tablet (400 mg total) by mouth daily. Start taking on: August 15, 2022   metoprolol succinate 25 MG 24 hr tablet Commonly known as: TOPROL-XL Take  0.5 tablets (12.5 mg total) by mouth daily. Start taking on: August 15, 2022   nystatin cream Commonly known as: MYCOSTATIN Apply 1 application topically 3 (three) times daily as needed (skin irritation/rash.).   OPTIVE 0.5-0.9 % ophthalmic solution Generic drug: carboxymethylcellul-glycerin Place 1 drop into both eyes daily as needed for dry eyes.   PHILLIPS COLON HEALTH PO Take 1 capsule by mouth daily.   potassium chloride SA 20 MEQ tablet Commonly known as: KLOR-CON M Take 2 tablets (40 mEq total) by mouth daily.   pravastatin 20 MG tablet Commonly known as: PRAVACHOL Take 1 tablet (20 mg total) by mouth every evening.   silver sulfADIAZINE 1 % cream Commonly known as: SILVADENE Apply 1 application topically daily.   sodium bicarbonate 650 MG tablet Take 2 tablets (1,300 mg total) by mouth 2 (two) times daily.        Major procedures and Radiology Reports - PLEASE review detailed and final reports for all details, in brief -   ECHOCARDIOGRAM COMPLETE  Result Date: 08/11/2022    ECHOCARDIOGRAM REPORT   Patient Name:   [name] Date of Exam: 08/11/2022 Medical Rec #:  [id number]      Height:       68.5 in Accession #:    [id number]     Weight:       145.0 lb Date of Birth:  1936/04/30     BSA:          1.792 m Patient Age:    59 years       BP:           138/94 mmHg Patient Gender: M              HR:           79 bpm. Exam Location:  [facility] Procedure: 2D Echo, Cardiac Doppler and Color Doppler Indications:    CHF-Acute Diastolic Q98.26  History:        Patient has prior history of Echocardiogram examinations, most                 recent 01/05/2019. Prior CABG, Stroke, Arrythmias:Permanent                 atrial fibrillation; Risk Factors:Hypertension, Diabetes and                 Dyslipidemia. Chronic anticoagulation, Coronary artery disease                 involving coronary bypass graft of native heart without angina                 pectoris.  Sonographer:     Alvino Chapel RCS Referring Phys: [id number] OLADAPO ADEFESO IMPRESSIONS  1. Left ventricular ejection fraction, by estimation, is 20 to 25%. The left ventricle has severely decreased function. The left ventricle demonstrates global hypokinesis. The left ventricular internal cavity size was mildly dilated. Elevated left atrial pressure.  2. Right ventricular systolic function is normal. The right ventricular size is normal.  3. The aortic valve is mildly sclerosed. Aortic valve regurgitation is mild. No aortic stenosis is present.  4. There is mild dilatation of the aortic root,  measuring 42 mm.  5. The mitral valve is grossly normal. Mild mitral valve regurgitation. No evidence of mitral stenosis.  6. The inferior vena cava is dilated in size with <50% respiratory variability, suggesting right atrial pressure of 15 mmHg. FINDINGS  Left Ventricle: Left ventricular ejection fraction, by estimation, is 20 to 25%. The left ventricle has severely decreased function. The left ventricle demonstrates global hypokinesis. The left ventricular internal cavity size was mildly dilated. There is no left ventricular hypertrophy. Abnormal (paradoxical) septal motion, consistent with left bundle branch block. Left ventricular diastolic function could not be evaluated due to atrial fibrillation. Elevated left atrial pressure. Right Ventricle: The right ventricular size is normal. No increase in right ventricular wall thickness. Right ventricular systolic function is normal. Left Atrium: Left atrial size was severely dilated. Right Atrium: Right atrial size was moderately dilated. Pericardium: There is no evidence of pericardial effusion. Mitral Valve: The mitral valve is grossly normal. Mild mitral valve regurgitation. No evidence of mitral valve stenosis. Tricuspid Valve: The tricuspid valve is grossly normal. Tricuspid valve regurgitation is not demonstrated. No evidence of tricuspid stenosis. Aortic Valve: The aortic valve is  calcified. Aortic valve regurgitation is mild. Aortic regurgitation PHT measures 330 msec. No aortic stenosis is present. Aortic valve mean gradient measures 6.0 mmHg. Aortic valve peak gradient measures 11.8 mmHg. Aortic valve area, by VTI measures 0.91 cm. Pulmonic Valve: The pulmonic valve was not well visualized. Pulmonic valve regurgitation is not visualized. No evidence of pulmonic stenosis. Aorta: There is mild dilatation of the aortic root, measuring 42 mm. Venous: The inferior vena cava is dilated in size with less than 50% respiratory variability, suggesting right atrial pressure of 15 mmHg. IAS/Shunts: No atrial level shunt detected by color flow Doppler.  LEFT VENTRICLE PLAX 2D LVIDd:         6.10 cm LVIDs:         5.00 cm LV PW:         0.80 cm LV IVS:        0.90 cm LVOT diam:     1.80 cm LV SV:         34 LV SV Index:   19 LVOT Area:     2.54 cm  RIGHT VENTRICLE TAPSE (M-mode): 1.6 cm LEFT ATRIUM            Index        RIGHT ATRIUM           Index LA diam:      4.90 cm  2.73 cm/m   RA Area:     26.30 cm LA Vol (A4C): 143.0 ml 79.78 ml/m  RA Volume:   80.10 ml  44.69 ml/m  AORTIC VALVE AV Area (Vmax):    0.94 cm AV Area (Vmean):   0.88 cm AV Area (VTI):     0.91 cm AV Vmax:           172.00 cm/s AV Vmean:          116.000 cm/s AV VTI:            0.371 m AV Peak Grad:      11.8 mmHg AV Mean Grad:      6.0 mmHg LVOT Vmax:         63.30 cm/s LVOT Vmean:        40.300 cm/s LVOT VTI:          0.133 m LVOT/AV VTI ratio: 0.36 AI PHT:  330 msec  AORTA Ao Root diam: 4.20 cm MITRAL VALVE MV Area (PHT): 5.66 cm       SHUNTS MV Decel Time: 134 msec       Systemic VTI:  0.13 m MR Peak grad:    96.4 mmHg    Systemic Diam: 1.80 cm MR Mean grad:    62.0 mmHg MR Vmax:         491.00 cm/s MR Vmean:        371.0 cm/s MR PISA:         1.57 cm MR PISA Eff ROA: 10 mm MR PISA Radius:  0.50 cm MV E velocity: 98.50 cm/s MV A velocity: 33.80 cm/s MV E/A ratio:  2.91 Vishnu Priya Mallipeddi Electronically  signed by Lorelee Cover Mallipeddi Signature Date/Time: 08/11/2022/1:46:34 PM    Final    DG Chest 2 View  Result Date: 08/10/2022 CLINICAL DATA:  Shortness of breath. EXAM: CHEST - 2 VIEW COMPARISON:  Chest radiograph dated 08/03/2022. FINDINGS: There is cardiomegaly with vascular congestion and edema. Small bilateral pleural effusions and bibasilar atelectasis or infiltrate. No pneumothorax. Median sternotomy wires and CABG vascular clips. No acute osseous pathology. IMPRESSION: Cardiomegaly with CHF and small bilateral pleural effusions. Superimposed pneumonia is not excluded. Electronically Signed   By: Anner Crete M.D.   On: 08/10/2022 18:03    Micro Results   No results found for this or any previous visit (from the past 240 hour(s)).  Today   Subjective    [name] today has no new complaints -Wife at bedside, questions answered No cough the congestion has improved significantly -No chest pains no palpitations no dizziness -Voiding really well          Patient has been seen and examined prior to discharge   Objective   Blood pressure 124/66, pulse 75, temperature 97.6 F (36.4 C), temperature source Oral, resp. rate 16, height 5' 8.5" (1.74 m), weight 69.6 kg, SpO2 97 %.   Intake/Output Summary (Last 24 hours) at 08/14/2022 1107 Last data filed at 08/13/2022 2225 Gross per 24 hour  Intake --  Output 2350 ml  Net -2350 ml    Exam Gen:- Awake Alert,  in no apparent distress  HEENT:- [email or web address].[email or web address], No sclera icterus Neck-Supple Neck,No JVD,.  Lungs-much improved air movement, no wheezing  CV- S1, S2 normal, irregular , prior CABG scar Abd-  +ve B.Sounds, Abd Soft, No tenderness,    Extremity/Skin:-Resolved rt Leg  edema, pedal pulses present on Rt Psych-affect is appropriate, oriented x3 Neuro-no new focal deficits, no tremors MSK-left BKA stump appears well-healed   Data Review   CBC w Diff:  Lab Results  Component Value Date   WBC 7.2 08/12/2022   HGB 9.0  (L) 08/12/2022   HCT 28.5 (L) 08/12/2022   PLT 292 08/12/2022   LYMPHOPCT 9 05/03/2022   MONOPCT 9 05/03/2022   EOSPCT 1 05/03/2022   BASOPCT 1 05/03/2022    CMP:  Lab Results  Component Value Date   NA 137 08/14/2022   K 3.5 08/14/2022   CL 101 08/14/2022   CO2 23 08/14/2022   BUN 44 (H) 08/14/2022   CREATININE 1.92 (H) 08/14/2022   CREATININE 1.17 03/07/2018   PROT 6.2 (L) 08/11/2022   ALBUMIN 2.8 (L) 08/13/2022   BILITOT 0.7 08/11/2022   ALKPHOS 184 (H) 08/11/2022   AST 25 08/11/2022   ALT 28 08/11/2022  .  Total Discharge time is about 33 minutes  Roxan Hockey M.D on 08/14/2022 at  11:07 AM  Go to [email or web address] -  for contact info  [facility] - Office  [phone]

## 2022-08-14 NOTE — Discharge Instructions (Signed)
1)Very low-salt diet advised 2)Weigh yourself daily, call if you gain more than 3 pounds in 1 day or more than 5 pounds in 1 week as your diuretic medications may need to be adjusted 3)Avoid ibuprofen/Advil/Aleve/Motrin/Goody Powders/Naproxen/BC powders/Meloxicam/Diclofenac/Indomethacin and other Nonsteroidal anti-inflammatory medications as these will make you more likely to bleed and can cause stomach ulcers, can also cause Kidney problems.  4)Repeat CBC and BMP, as well as serum magnesium and serum calcium blood tests in 1 week 5)Please keep your LifeVest on is much as possible as this will help to shock you and "restart" your Heart if you have an irregular heartbeat

## 2022-08-14 NOTE — Progress Notes (Signed)
Patient and wife state understanding of discharge instructions

## 2022-08-14 NOTE — Progress Notes (Signed)
Rounding Note    Patient Name: [name] Date of Encounter: 08/14/2022  [facility] Cardiologist: Candee Furbish, MD   Subjective   No compalints  Inpatient Medications    Scheduled Meds:  apixaban  2.5 mg Oral BID   calcium-vitamin D  2 tablet Oral BID   furosemide  40 mg Intravenous Q12H   metoprolol succinate  25 mg Oral Daily   pravastatin  20 mg Oral q1800   Continuous Infusions:  calcium gluconate     magnesium sulfate bolus IVPB     PRN Meds:    Vital Signs    Vitals:   08/13/22 1500 08/13/22 2119 08/14/22 0356 08/14/22 0407  BP: 128/66 114/73 (!) 114/90   Pulse: (!) 59 78 63   Resp: 18 20 16    Temp: 97.7 F (36.5 C) 97.6 F (36.4 C)    TempSrc: Axillary Oral Oral   SpO2: 99% 99% 97%   Weight:    69.6 kg  Height:        Intake/Output Summary (Last 24 hours) at 08/14/2022 0944 Last data filed at 08/13/2022 2225 Gross per 24 hour  Intake --  Output 2350 ml  Net -2350 ml      08/14/2022    4:07 AM 08/13/2022    6:13 AM 08/12/2022    5:00 AM  Last 3 Weights  Weight (lbs) 153 lb 7 oz 151 lb 7.3 oz 154 lb 5.2 oz  Weight (kg) 69.6 kg 68.7 kg 70 kg      Telemetry    afib - Personally Reviewed  ECG    N/a - Personally Reviewed  Physical Exam   GEN: No acute distress.   Neck: No JVD Cardiac: irreg, no m/r/g no jvd Respiratory: Clear to auscultation bilaterally. GI: Soft, nontender, non-distended  MS: No edema; No deformity. Neuro:  Nonfocal  Psych: Normal affect   Labs    High Sensitivity Troponin:   Recent Labs  Lab 08/10/22 2025 08/10/22 2210  TROPONINIHS 14 12     Chemistry Recent Labs  Lab 08/11/22 0521 08/11/22 0522 08/12/22 0655 08/13/22 0603 08/14/22 0529  NA 142  --  138 138 137  K 4.5  --  3.9 3.9 3.5  CL 114*  --  107 104 101  CO2 19*  --  22 23 23   GLUCOSE 106*  --  98 97 92  BUN 30*  --  36* 42* 44*  CREATININE 1.82*  --  2.01* 1.93* 1.92*  CALCIUM 6.4*  --  7.3* 7.4* 6.9*  MG  --  0.9*   --   --  1.5*  PROT 6.2*  --   --   --   --   ALBUMIN 2.7*  --  2.8* 2.8*  --   AST 25  --   --   --   --   ALT 28  --   --   --   --   ALKPHOS 184*  --   --   --   --   BILITOT 0.7  --   --   --   --   GFRNONAA 36*  --  32* 34* 34*  ANIONGAP 9  --  9 11 13     Lipids No results for input(s): "CHOL", "TRIG", "HDL", "LABVLDL", "LDLCALC", "CHOLHDL" in the last 168 hours.  Hematology Recent Labs  Lab 08/10/22 2025 08/11/22 0521 08/12/22 0655  WBC 7.7 5.8 7.2  RBC 3.07* 2.83* 3.02*  HGB 9.4* 8.5*  9.0*  HCT 29.9* 27.2* 28.5*  MCV 97.4 96.1 94.4  MCH 30.6 30.0 29.8  MCHC 31.4 31.3 31.6  RDW 16.0* 15.8* 15.9*  PLT 310 268 292   Thyroid No results for input(s): "TSH", "FREET4" in the last 168 hours.  BNP Recent Labs  Lab 08/10/22 2025  BNP 2,530.0*    DDimer No results for input(s): "DDIMER" in the last 168 hours.   Radiology    No results found.  Cardiac Studies     Patient Profile     Patient is a 86 y/o M known to have CAD s/p CABG in 1990s with repeat Cath in 2002s showing patent grafts with normal LVEF,  PAD s/p CIA stents in 2021 and L AKA, permanent Afib on AC, HTN, DM2, GIB, chronic venous insufficiency admitted to medicine team for CHF management. Cardiology is consulted for the same at the request of Dr Denton Brick.  Assessment & Plan    1.Acute HFrEF 12/2018 echo: LVEF 55% 07/2022 echo: LVEF 20-25% CXR +CHF, small bilateral effusions. BNP 2530 - I/Os are incomplete. Weights appear inaccurate. He is on IV lasix 8m bid, labile Cr without consistent trend.    -medical therapy limited by renal dysfunction. GFR 34, as low as 22 in 06/2022 - medical therapy with toprol 266mdaily. Avoid ACE/ARB/ARNI/aldactone given renal function, GFR reasonable for SLGT2i. Start farxiga 1048maily. Beta blocker dosing limited by low HRs at times.  - Dr MalDellia Clouds arranged lifevest at d/c given low EF and episodes NSVT - given renal function would hold on cath at this time,  work to optimize medical therapy. Perhaps can reconsider as outpatient pending renal function and clinical course.   - appears euvolemic. D/c IV lasix, start oral 17m4mily.  -recurrent low K and Mg, start oral KCl 17mE61mily and magnesium oxide 400mg 49my. Needs labs at f/u.   2. CAD with prior CABG - no evidence of ACS - with drop in LVEF possibility of recurrent obstructive disease, given renal function would not pursue cath at this time - medical therapy with toprol, no ACE/ARB given renal dysfunction. He is on pravastatin 20mg d7m difficultly tolerating more potent statins. On eliquis for afib  3.Permanent afib - rate control with toprol, he is on eliquis for stroke prvention  4.NSVT/Low LVEF - lifevest has been arranged at discharge, patient has on -Mg has been low, replete today. K low, given 17mEq x52mneeds to get Mg here before d/c  For questions or updates, please contact [facility] [email or web address] for contact info under        Signed, Jvion Turgeon, Carlyle Dolly/23/2023, 9:44 AM

## 2022-08-14 NOTE — TOC Transition Note (Signed)
Transition of Care [facility]) - CM/SW Discharge Note   Patient Details  Name: [name] MRN: [id number] Date of Birth: 1936/05/03  Transition of Care [facility]) CM/SW Contact:  Boneta Lucks, RN Phone Number: 08/14/2022, 11:37 AM   Clinical Narrative:   Patient admitted with CHF, discharging home with wife. Patient needs CHF education. They have not used home health in the past. Wife is agreeable he needs some education and any chair exercises that would help. No preferences in agencies. Vaughan Basta with [facility] accepted the referral. MD aware to order.    Final next level of care: [place] Barriers to Discharge: Barriers Resolved   Patient Goals and CMS Choice Patient states their goals for this hospitalization and ongoing recovery are:: to go home, CMS Medicare.gov Compare Post Acute Care list provided to:: Patient Represenative (must comment) Choice offered to / list presented to : Spouse  Discharge Placement         Name of family member notified: Wife Patient and family notified of of transfer: 08/14/22  Discharge Plan and Services      HH Arranged: PT, OT, RN   Date [place]: 08/14/22 Time [place]: 51 Representative spoke with at [place]: [place]   Readmission Risk Interventions    08/14/2022   11:37 AM 06/26/2022   11:03 AM  Readmission Risk Prevention Plan  Transportation Screening Complete Complete  PCP or Specialist Appt within 3-5 Days Complete   HRI or Home Care Consult Complete Complete  Social Work Consult for [place] Planning/Counseling Complete Complete  Palliative Care Screening Not Applicable Not Applicable  Medication Review Press photographer) Complete Complete

## 2022-08-21 ENCOUNTER — Telehealth: Payer: Self-pay | Admitting: Cardiology

## 2022-08-21 NOTE — Telephone Encounter (Signed)
Pt's wife would like a callback in regards to the Life Vest that pt is wearing. Please advise

## 2022-08-21 NOTE — Telephone Encounter (Signed)
Spoke with wife who is asking why the patient has to wear "this Lifevest."  Advised wife as to pt's condition explaining  (from 10/21 progress note by Dr Denton Brick) 8)Recurrent episodes of nonsustained V. tach noted--- patient is usually unaware of this and asymptomatic typically  Wife states understanding but is concerned because the pt has to lay on his left side d/t being an amputee and the Lifevest is making him sore.  She is asking what she should be to help prevent the soreness.  Advised wife to contact [place] to discuss options.  Wife was appreciative of the call information.

## 2022-08-30 ENCOUNTER — Encounter: Payer: Self-pay | Admitting: Family Medicine

## 2022-09-01 ENCOUNTER — Ambulatory Visit: Payer: Medicare Other | Attending: Student | Admitting: Student

## 2022-09-01 ENCOUNTER — Encounter: Payer: Self-pay | Admitting: Student

## 2022-09-01 VITALS — BP 118/64 | HR 78 | Ht 68.5 in | Wt 149.0 lb

## 2022-09-01 DIAGNOSIS — N1832 Chronic kidney disease, stage 3b: Secondary | ICD-10-CM | POA: Diagnosis present

## 2022-09-01 DIAGNOSIS — I4729 Other ventricular tachycardia: Secondary | ICD-10-CM | POA: Insufficient documentation

## 2022-09-01 DIAGNOSIS — I4821 Permanent atrial fibrillation: Secondary | ICD-10-CM | POA: Insufficient documentation

## 2022-09-01 DIAGNOSIS — I5020 Unspecified systolic (congestive) heart failure: Secondary | ICD-10-CM | POA: Diagnosis present

## 2022-09-01 DIAGNOSIS — E785 Hyperlipidemia, unspecified: Secondary | ICD-10-CM | POA: Diagnosis present

## 2022-09-01 DIAGNOSIS — I2510 Atherosclerotic heart disease of native coronary artery without angina pectoris: Secondary | ICD-10-CM | POA: Diagnosis present

## 2022-09-01 DIAGNOSIS — I70221 Atherosclerosis of native arteries of extremities with rest pain, right leg: Secondary | ICD-10-CM | POA: Diagnosis not present

## 2022-09-01 DIAGNOSIS — I10 Essential (primary) hypertension: Secondary | ICD-10-CM | POA: Diagnosis present

## 2022-09-01 NOTE — Patient Instructions (Signed)
Medication Instructions:  Your physician recommends that you continue on your current medications as directed. Please refer to the Current Medication list given to you today.  *If you need a refill on your cardiac medications before your next appointment, please call your pharmacy*   Lab Work: NONE   If you have labs (blood work) drawn today and your tests are completely normal, you will receive your results only by: [place] (if you have MyChart) OR A paper copy in the mail If you have any lab test that is abnormal or we need to change your treatment, we will call you to review the results.   Testing/Procedures: NONE   Follow-Up: At [facility], you and your health needs are our priority.  As part of our continuing mission to provide you with exceptional heart care, we have created designated Provider Care Teams.  These Care Teams include your primary Cardiologist (physician) and Advanced Practice Providers (APPs -  Physician Assistants and Nurse Practitioners) who all work together to provide you with the care you need, when you need it.  We recommend signing up for the patient portal called "MyChart".  Sign up information is provided on this After Visit Summary.  MyChart is used to connect with patients for Virtual Visits (Telemedicine).  Patients are able to view lab/test results, encounter notes, upcoming appointments, etc.  Non-urgent messages can be sent to your provider as well.   To learn more about what you can do with MyChart, go to [email or web address].    Your next appointment:   2 - 3 month(s)  The format for your next appointment:   In Person  Provider:   You may see Candee Furbish, MD or one of the following Advanced Practice Providers on your designated Care Team:   Bernerd Pho, PA-C  Ermalinda Barrios, PA-C     Other Instructions Thank you for choosing [facility]!   Your physician recommends that you schedule a follow-up  appointment in: 3-4 weeks with Dr. Myles Gip or [place]

## 2022-09-01 NOTE — Progress Notes (Unsigned)
Cardiology Office Note    Date:  09/02/2022   ID:  LORRY ANASTASI, DOB May 21, 1936, MRN [id number]  PCP:  Lemmie Evens, MD  Cardiologist: Candee Furbish, MD    Chief Complaint  Patient presents with   Hospitalization Follow-up    History of Present Illness:    [name] is a 86 y.o. male with past history of CAD (s/p CABG in 1990's, patent bypass grafts by catheterization in 2002, low-risk NST in 2008), permanent atrial fibrillation, HTN, HLD, history of GI bleed, PAD (s/p L BKA) and prior CVA who presents to the office today for hospital follow-up.  He was examined by myself in 05/2022 and reported discomfort along the posterior aspect of his neck but denied any specific anginal symptoms. Activity was limited as he was mostly wheelchair-bound. It was recommended if he developed worsening symptoms, to consider a [employer]. The patient's family did call the office several days later reporting he was having numbness along his face and labs were obtained which showed his calcium was critically low at 4.3 and K+ was at 2.4. Also had an AKI with creatinine at 4.28. It was recommend that he proceed to the Emergency Department for further evaluation given his multiple lab abnormalities. He was admitted to the hospital and treated for an AKI which was felt to be secondary to prerenal azotemia in the setting of decreased oral intake and significant GI losses. HCTZ and Lisinopril were discontinued at the time of discharge.  He presented back to the ED on 08/10/2022 for evaluation of worsening dyspnea on exertion and orthopnea for the past 2 weeks. He was found to have an acute CHF exacerbation with BNP elevated to 2530. Repeat echocardiogram showed his EF was significantly reduced at 20 to 25% with global hypokinesis. RV function was normal and he did have mild AI, mild MR and mild dilatation of the aortic root measuring 42 mm. He responded well with IV Lasix during admission and was  transitioned to Lasix 40 mg daily at the time of discharge. He was also started on Toprol-XL 25 mg daily and Farxiga 10 mg daily but he was not started on an ACE-I/ARB/ARNI/MRA given his renal dysfunction. Creatinine peaked at 2.01 during admission and had improved to 1.92 at discharge. Dr. Harl Bowie did round on the patient during admission and did not recommend a repeat catheterization at that time given his renal function. A LifeVest was arranged prior to discharge given episodes of NSVT.  He did have follow-up labs on 08/28/2022 and creatinine had improved to 1.65 with K+ at 5.1. Hgb was at 9.3 which was similar to values from his admission.   In talking with the patient, his wife and his daughter today, he reports overall feeling well since his recent admission. Says his breathing has been back to baseline. No recurrent orthopnea, PND or pitting edema. He denies any recent chest pain or palpitations. He does report discomfort with having to wear the LifeVest and is hopeful this can be removed soon.  Past Medical History:  Diagnosis Date   Arteriosclerotic cardiovascular disease (ASCVD)    CABG in 1990s; 2002 total obstruction of LAD, CX and RCA with patent grafts and nl EF; Stress nuc. 2008 - mild LV dilation; normal EF; questionable small anteroapical scar; no ischemia   Arthritis    "left knee" (11/08/2015)   Atrial fibrillation ([place])    Cellulitis 11/08/2015   "both feet"   Chronic anticoagulation    Dental crowns present  Diabetic peripheral neuropathy (HCC)    bilateral lower leg, hands   History of blood transfusion    "related to my leg surgery?"   Hypertension    states under control with meds., has been on med. x "long time"   Iron deficiency anemia    Jaw cancer ([place]) 1990s   "squamous cell"   Myocardial infarction ([place]) 1990s   "after my jaw OR"   Pedal edema    "not a lot", per pt.   Peripheral vascular disease ([place])    Presence of retained hardware 07/2015   failed  hardware jaw   Runny nose 07/27/2015   clear drainage, per pt.   Stroke ([place])    Type II diabetes mellitus ([place])    "diet controlled" (11/08/2015)    Past Surgical History:  Procedure Laterality Date   ABDOMINAL AORTOGRAM W/LOWER EXTREMITY Bilateral 08/20/2020   Procedure: ABDOMINAL AORTOGRAM W/LOWER EXTREMITY;  Surgeon: Angelia Mould, MD;  Location: [facility];  Service: Cardiovascular;  Laterality: Bilateral;   AMPUTATION Left 11/11/2015   Procedure: LEFT ABOVE KNEE AMPUTATION;  Surgeon: Angelia Mould, MD;  Location: [facility];  Service: Vascular;  Laterality: Left;   CARDIAC CATHETERIZATION  1990s; 04/16/2001   CATARACT EXTRACTION W/ INTRAOCULAR LENS  IMPLANT, BILATERAL Bilateral 09/2015   COLONOSCOPY N/A 11/27/2014   Procedure: COLONOSCOPY;  Surgeon: Rogene Houston, MD;  [facility];  Service: Endoscopy;  Laterality: N/A;  830   CORONARY ARTERY BYPASS GRAFT  1990's   "CABG X4"   ESOPHAGOGASTRODUODENOSCOPY N/A 02/14/2018   Procedure: ESOPHAGOGASTRODUODENOSCOPY (EGD);  Surgeon: Rogene Houston, MD;  [facility];  Service: Endoscopy;  Laterality: N/A;   ESOPHAGOGASTRODUODENOSCOPY (EGD) WITH PROPOFOL N/A 02/25/2018   Procedure: ESOPHAGOGASTRODUODENOSCOPY (EGD) WITH PROPOFOL;  Surgeon: Rogene Houston, MD;  [facility];  Service: Endoscopy;  Laterality: N/A;   FRACTURE SURGERY     GIVENS CAPSULE STUDY N/A 02/15/2018   Procedure: GIVENS CAPSULE STUDY;  Surgeon: Rogene Houston, MD;  [facility];  Service: Endoscopy;  Laterality: N/A;   GIVENS CAPSULE STUDY N/A 03/27/2018   Procedure: GIVENS CAPSULE STUDY;  Surgeon: Rogene Houston, MD;  [facility];  Service: Endoscopy;  Laterality: N/A;   I & D EXTREMITY Left 11/10/2015   Procedure: INCISION AND DRAINAGE LEFT FOOT, AMPUTATION OF LEFT THIRD TOE;  Surgeon: Conrad [place], MD;  Location: [facility];  Service: Vascular;  Laterality: Left;   INCISION AND DRAINAGE ABSCESS Left  06/16/2005   wide exc. abscess 5th toe   IR ANGIOGRAM VISCERAL SELECTIVE  03/28/2018   IR ANGIOGRAM VISCERAL SELECTIVE  03/28/2018   IR CT HEAD LTD  01/04/2019   IR PERCUTANEOUS ART THROMBECTOMY/INFUSION INTRACRANIAL INC DIAG ANGIO  01/04/2019   IR US GUIDE VASC ACCESS LEFT  03/28/2018   MANDIBULAR HARDWARE REMOVAL Left 08/02/2015   Procedure:  HARDWARE REMOVAL TWO MANDIBULAR SCREWS;  Surgeon: Jannette Fogo, DDS;  Location: [facility];  Service: Oral Surgery;  Laterality: Left;   ORIF TIBIA FRACTURE Left ~ [place] N/A 08/14/2016   Procedure: Abdominal Aortogram w/Lower Extremity;  Surgeon: Angelia Mould, MD;  Location: [facility];  Service: Cardiovascular;  Laterality: N/A;   PERIPHERAL VASCULAR INTERVENTION Bilateral 08/20/2020   Procedure: PERIPHERAL VASCULAR INTERVENTION;  Surgeon: Angelia Mould, MD;  Location: [facility];  Service: Cardiovascular;  Laterality: Bilateral;  iliac stents   RADIOLOGY WITH ANESTHESIA N/A  01/04/2019   Procedure: CODE STROKE;  Surgeon: Radiologist, Medication, MD;  Location: [facility];  Service: Radiology;  Laterality: N/A;   SQUAMOUS CELL CARCINOMA EXCISION Left 1993   "took my jaw out; got cadavar in there now"   TONSILLECTOMY      Current Medications: Outpatient Medications Prior to Visit  Medication Sig Dispense Refill   acetaminophen (TYLENOL) 325 MG tablet Take 2 tablets (650 mg total) by mouth every 6 (six) hours as needed for moderate pain. (Patient taking differently: Take 1,000 mg by mouth every 6 (six) hours as needed for moderate pain.) 30 tablet 0   ammonium lactate (AMLACTIN) 12 % cream Apply topically 2 (two) times daily.     apixaban (ELIQUIS) 2.5 MG TABS tablet Take 1 tablet (2.5 mg total) by mouth 2 (two) times daily. 60 tablet 1   calcium-vitamin D (OSCAL WITH D) 500-5 MG-MCG tablet Take 2 tablets by mouth 2 (two) times daily. 60 tablet 2   carboxymethylcellul-glycerin  (OPTIVE) 0.5-0.9 % ophthalmic solution Place 1 drop into both eyes daily as needed for dry eyes.     cetirizine (ZYRTEC) 10 MG tablet Take 10 mg by mouth daily.     dapagliflozin propanediol (FARXIGA) 10 MG TABS tablet Take 1 tablet (10 mg total) by mouth daily. 90 tablet 3   feeding supplement (BOOST / RESOURCE BREEZE) LIQD Take 1 Container by mouth 2 (two) times daily between meals.     furosemide (LASIX) 40 MG tablet Take 1 tablet (40 mg total) by mouth daily. 30 tablet 3   halobetasol (ULTRAVATE) 0.05 % cream Apply topically.     magnesium oxide (MAG-OX) 400 (240 Mg) MG tablet Take 1 tablet (400 mg total) by mouth daily. 30 tablet 3   metoprolol succinate (TOPROL-XL) 25 MG 24 hr tablet Take 0.5 tablets (12.5 mg total) by mouth daily. 90 tablet 3   nystatin cream (MYCOSTATIN) Apply 1 application topically 3 (three) times daily as needed (skin irritation/rash.).      Omega-3 Fatty Acids (FISH OIL) 1200 MG CAPS Take 1,200 mg by mouth daily.     Pediatric Multivitamins-Iron (FLINTSTONES PLUS IRON PO) Take 1 tablet by mouth daily.     potassium chloride SA (KLOR-CON M) 20 MEQ tablet Take 2 tablets (40 mEq total) by mouth daily. 60 tablet 3   pravastatin (PRAVACHOL) 20 MG tablet Take 1 tablet (20 mg total) by mouth every evening. 30 tablet 3   Probiotic Product (PHILLIPS COLON HEALTH PO) Take 1 capsule by mouth daily.      silver sulfADIAZINE (SILVADENE) 1 % cream Apply 1 application topically daily.     sodium bicarbonate 650 MG tablet Take 2 tablets (1,300 mg total) by mouth 2 (two) times daily. 120 tablet 1   diphenoxylate-atropine (LOMOTIL) 2.5-0.025 MG tablet Take 1 tablet by mouth 4 (four) times daily as needed for diarrhea or loose stools. (Patient not taking: Reported on 09/01/2022) 30 tablet 1   No facility-administered medications prior to visit.     Allergies:   Lipitor [atorvastatin], Simvastatin, Penicillins, and Sulfa antibiotics   Social History   Socioeconomic History    Marital status: Married    Spouse name: Not on file   Number of children: Not on file   Years of education: Not on file   Highest education level: Not on file  Occupational History   Not on file  Tobacco Use   Smoking status: Never   Smokeless tobacco: Never  Vaping Use   Vaping Use: Never used  Substance and Sexual Activity   Alcohol use: No    Alcohol/week: 0.0 standard drinks of alcohol   Drug use: No   Sexual activity: Yes  Other Topics Concern   Not on file  Social History Narrative   Not on file   Social Determinants of Health   Financial Resource Strain: Not on file  Food Insecurity: No Food Insecurity (08/11/2022)   Hunger Vital Sign    Worried About Running Out of Food in the Last Year: Never true    Ran Out of Food in the Last Year: Never true  Transportation Needs: No Transportation Needs (08/11/2022)   PRAPARE - Hydrologist (Medical): No    Lack of Transportation (Non-Medical): No  Physical Activity: Not on file  Stress: Not on file  Social Connections: Not on file     Family History:  The patient's family history includes Heart disease in his mother.   Review of Systems:    Please see the history of present illness.     All other systems reviewed and are otherwise negative except as noted above.   Physical Exam:    VS:  BP 118/64   Pulse 78   Ht 5' 8.5" (1.74 m)   Wt 149 lb (67.6 kg)   SpO2 99%   BMI 22.33 kg/m    General: Pleasant, elderly male appearing in no acute distress. Head: Normocephalic, atraumatic. Neck: No carotid bruits. JVD not elevated.  Lungs: Respirations regular and unlabored, without wheezes or rales.  Heart: Irregularly irregular. No S3 or S4.  No murmur, no rubs, or gallops appreciated.  LifeVest in place Abdomen: Appears non-distended. No obvious abdominal masses. Msk:  Strength and tone appear normal for age. No obvious joint deformities or effusions. Extremities: No clubbing or cyanosis.  No pitting edema. Left BKA.  Neuro: Alert and oriented X 3. Moves all extremities spontaneously. No focal deficits noted. Psych:  Responds to questions appropriately with a normal affect. Skin: No rashes or lesions noted  Wt Readings from Last 3 Encounters:  09/01/22 149 lb (67.6 kg)  08/14/22 153 lb 7 oz (69.6 kg)  06/26/22 150 lb 5.7 oz (68.2 kg)     Studies/Labs Reviewed:   EKG:  EKG is not ordered today.    Recent Labs: 08/10/2022: B Natriuretic Peptide 2,530.0 08/11/2022: ALT 28 08/12/2022: Hemoglobin 9.0; Platelets 292 08/14/2022: BUN 44; Creatinine, Ser 1.92; Magnesium 1.5; Potassium 3.5; Sodium 137   Lipid Panel    Component Value Date/Time   CHOL 90 01/05/2019 0611   TRIG 116 01/07/2019 2022   HDL 30 (L) 01/05/2019 0611   CHOLHDL 3.0 01/05/2019 0611   VLDL 16 01/05/2019 0611   LDLCALC 44 01/05/2019 0611    Additional studies/ records that were reviewed today include:   Echocardiogram: 08/11/2022 IMPRESSIONS     1. Left ventricular ejection fraction, by estimation, is 20 to 25%. The  left ventricle has severely decreased function. The left ventricle  demonstrates global hypokinesis. The left ventricular internal cavity size  was mildly dilated. Elevated left  atrial pressure.   2. Right ventricular systolic function is normal. The right ventricular  size is normal.   3. The aortic valve is mildly sclerosed. Aortic valve regurgitation is  mild. No aortic stenosis is present.   4. There is mild dilatation of the aortic root, measuring 42 mm.   5. The mitral valve is grossly normal. Mild mitral valve regurgitation.  No evidence of mitral stenosis.  6. The inferior vena cava is dilated in size with <50% respiratory  variability, suggesting right atrial pressure of 15 mmHg.   Assessment:    1. HFrEF (heart failure with reduced ejection fraction) ([place])   2. NSVT (nonsustained ventricular tachycardia) ([place])   3. Essential hypertension   4. Coronary artery  disease involving native coronary artery of native heart without angina pectoris   5. Hyperlipidemia LDL goal <70   6. Permanent atrial fibrillation (HCC)   7. Stage 3b chronic kidney disease ([place])      Plan:   In order of problems listed above:  1. HFrEF/NSVT - Recent echocardiogram showed his EF was reduced at 20 to 25% with global hypokinesis and ischemic evaluation was not pursued at that time given his renal function. He was placed on a LifeVest during admission per Dr. Dellia Cloud but in discussion with the patient today, he is not keen on pursuing ICD placement given his age which certainly seems reasonable. Suspect his NSVT during admission was secondary to ischemia and his cardiomyopathy but as discussed above, he prefers more conservative management given his age. Since he already has the LifeVest in place, I will arrange for follow-up with EP to see if they wish for him to be on antiarrhythmic therapy prior to removing this as he is open to medication changes. - In regards to his cardiomyopathy, will continue Farxiga 10 mg daily, Lasix 40 mg daily and Toprol-XL 12.5 mg daily. He has not been on an ACE-I/ARB/ARNI/MRA as his creatinine was previously as high as 4 in 06/2022. Could consider Hydralazine/Nitrates in the future if BP allows.  2. CAD - He is s/p CABG in 1990's with patent bypass grafts by catheterization in 2002. Most recent ischemic evaluation was a low-risk NST in 2008. We discussed further ischemic evaluation today including a possible repeat stress test and subsequent cardiac catheterization if abnormal but he does not wish to undergo further procedures and he would also be at high-risk for contrast-induced nephropathy. Therefore, I did not order a stress test since this would not change his management strategy. Thankfully, he denies any recent chest pain. Continue current medical therapy with Toprol-XL 12.5 mg daily and Pravastatin 20 mg daily. He is no longer on ASA given  the need for anticoagulation.  3. HTN - His BP is well-controlled at 118/64 during today's visit. Continue Lasix 40 mg daily and Toprol-XL 12.5 mg daily.  4. HLD - His LDL was at 31 in 09/2021. Continue current medical therapy with Pravastatin 20 mg daily.  5. Permanent Atrial Fibrillation - He denies any recent palpitations and his heart rate is well controlled in the 70's during today's visit. Continue Toprol-XL 12.5 mg daily for rate control. - No reports of active bleeding. He remains on Eliquis 2.5 mg twice daily for anticoagulation which is the appropriate dose at this time given his age of 7 and creatinine at 1.65.  6. Stage 3 CKD - His creatinine peaked at 2.01 during his most recent admission and had improved to 1.92 at discharge. At 1.65 when checked on 08/28/2022. Previously at 4.43 in 06/2022.    Medication Adjustments/Labs and Tests Ordered: Current medicines are reviewed at length with the patient today.  Concerns regarding medicines are outlined above.  Medication changes, Labs and Tests ordered today are listed in the Patient Instructions below. Patient Instructions  Medication Instructions:  Your physician recommends that you continue on your current medications as directed. Please refer to the Current Medication list given to  you today.  *If you need a refill on your cardiac medications before your next appointment, please call your pharmacy*   Lab Work: NONE   If you have labs (blood work) drawn today and your tests are completely normal, you will receive your results only by: [facility] (if you have MyChart) OR A paper copy in the mail If you have any lab test that is abnormal or we need to change your treatment, we will call you to review the results.   Testing/Procedures: NONE   Follow-Up: At [facility], you and your health needs are our priority.  As part of our continuing mission to provide you with exceptional heart care, we have created  designated Provider Care Teams.  These Care Teams include your primary Cardiologist (physician) and Advanced Practice Providers (APPs -  Physician Assistants and Nurse Practitioners) who all work together to provide you with the care you need, when you need it.  We recommend signing up for the patient portal called "MyChart".  Sign up information is provided on this After Visit Summary.  MyChart is used to connect with patients for Virtual Visits (Telemedicine).  Patients are able to view lab/test results, encounter notes, upcoming appointments, etc.  Non-urgent messages can be sent to your provider as well.   To learn more about what you can do with MyChart, go to [email or web address].    Your next appointment:   2 - 3 month(s)  The format for your next appointment:   In Person  Provider:   You may see Candee Furbish, MD or one of the following Advanced Practice Providers on your designated Care Team:   Bernerd Pho, PA-C  Ermalinda Barrios, PA-C     Other Instructions Thank you for choosing [facility]!   Your physician recommends that you schedule a follow-up appointment in: 3-4 weeks with Dr. Myles Gip or [place]         Signed, Erma Heritage, PA-C  09/02/2022 2:53 PM    [facility] [address] [place], [place] [address] Phone: [phone] Fax: [phone]

## 2022-09-02 ENCOUNTER — Encounter: Payer: Self-pay | Admitting: Student

## 2022-09-05 ENCOUNTER — Ambulatory Visit: Payer: Medicare Other | Attending: Internal Medicine | Admitting: Internal Medicine

## 2022-09-05 ENCOUNTER — Encounter: Payer: Self-pay | Admitting: Internal Medicine

## 2022-09-05 VITALS — BP 110/68 | HR 55 | Ht 69.0 in | Wt 150.8 lb

## 2022-09-05 DIAGNOSIS — I255 Ischemic cardiomyopathy: Secondary | ICD-10-CM | POA: Insufficient documentation

## 2022-09-05 NOTE — Progress Notes (Signed)
HPI [name] is referred by [place] for evaluation of an ICM, EF 20%. He was hospitalized several weeks ago and his echo EF was 20%. He did not undergo an ischemic eval due to worsening renal insufficiency. He feels well and his dyspnea is much improved. No syncope.  Allergies  Allergen Reactions   Lipitor [Atorvastatin] Other (See Comments)    SEVERE HEADACHE   Simvastatin Other (See Comments)    MUSCLE ACHES   Penicillins Rash    Has patient had a PCN reaction causing immediate rash, facial/tongue/throat swelling, SOB or lightheadedness with hypotension: Yes Has patient had a PCN reaction causing severe rash involving mucus membranes or skin necrosis: No Has patient had a PCN reaction that required hospitalization No Has patient had a PCN reaction occurring within the last 10 years: No If all of the above answers are "NO", then may proceed with Cephalosporin use.    Sulfa Antibiotics Rash    Tolerates silver sulfadiazine cream at home     Current Outpatient Medications  Medication Sig Dispense Refill   acetaminophen (TYLENOL) 325 MG tablet Take 2 tablets (650 mg total) by mouth every 6 (six) hours as needed for moderate pain. (Patient taking differently: Take 1,000 mg by mouth every 6 (six) hours as needed for moderate pain.) 30 tablet 0   ammonium lactate (AMLACTIN) 12 % cream Apply topically 2 (two) times daily.     apixaban (ELIQUIS) 2.5 MG TABS tablet Take 1 tablet (2.5 mg total) by mouth 2 (two) times daily. 60 tablet 1   calcium-vitamin D (OSCAL WITH D) 500-5 MG-MCG tablet Take 2 tablets by mouth 2 (two) times daily. 60 tablet 2   carboxymethylcellul-glycerin (OPTIVE) 0.5-0.9 % ophthalmic solution Place 1 drop into both eyes daily as needed for dry eyes.     cetirizine (ZYRTEC) 10 MG tablet Take 10 mg by mouth daily.     dapagliflozin propanediol (FARXIGA) 10 MG TABS tablet Take 1 tablet (10 mg total) by mouth daily. 90 tablet 3   diphenoxylate-atropine  (LOMOTIL) 2.5-0.025 MG tablet Take 1 tablet by mouth 4 (four) times daily as needed for diarrhea or loose stools. 30 tablet 1   feeding supplement (BOOST / RESOURCE BREEZE) LIQD Take 1 Container by mouth 2 (two) times daily between meals.     furosemide (LASIX) 40 MG tablet Take 1 tablet (40 mg total) by mouth daily. 30 tablet 3   halobetasol (ULTRAVATE) 0.05 % cream Apply topically.     magnesium oxide (MAG-OX) 400 (240 Mg) MG tablet Take 1 tablet (400 mg total) by mouth daily. 30 tablet 3   metoprolol succinate (TOPROL-XL) 25 MG 24 hr tablet Take 0.5 tablets (12.5 mg total) by mouth daily. 90 tablet 3   nystatin cream (MYCOSTATIN) Apply 1 application topically 3 (three) times daily as needed (skin irritation/rash.).      Omega-3 Fatty Acids (FISH OIL) 1200 MG CAPS Take 1,200 mg by mouth daily.     Pediatric Multivitamins-Iron (FLINTSTONES PLUS IRON PO) Take 1 tablet by mouth daily.     potassium chloride SA (KLOR-CON M) 20 MEQ tablet Take 2 tablets (40 mEq total) by mouth daily. 60 tablet 3   pravastatin (PRAVACHOL) 20 MG tablet Take 1 tablet (20 mg total) by mouth every evening. 30 tablet 3   Probiotic Product (PHILLIPS COLON HEALTH PO) Take 1 capsule by mouth daily.      silver sulfADIAZINE (SILVADENE) 1 % cream Apply 1 application topically daily.  sodium bicarbonate 650 MG tablet Take 2 tablets (1,300 mg total) by mouth 2 (two) times daily. 120 tablet 1   No current facility-administered medications for this visit.     Past Medical History:  Diagnosis Date   Arteriosclerotic cardiovascular disease (ASCVD)    CABG in 1990s; 2002 total obstruction of LAD, CX and RCA with patent grafts and nl EF; Stress nuc. 2008 - mild LV dilation; normal EF; questionable small anteroapical scar; no ischemia   Arthritis    "left knee" (11/08/2015)   Atrial fibrillation ([place])    Cellulitis 11/08/2015   "both feet"   Chronic anticoagulation    Dental crowns present    Diabetic peripheral neuropathy  ([place])    bilateral lower leg, hands   History of blood transfusion    "related to my leg surgery?"   Hypertension    states under control with meds., has been on med. x "long time"   Iron deficiency anemia    Jaw cancer ([place]) 1990s   "squamous cell"   Myocardial infarction ([place]) 1990s   "after my jaw OR"   Pedal edema    "not a lot", per pt.   Peripheral vascular disease ([place])    Presence of retained hardware 07/2015   failed hardware jaw   Runny nose 07/27/2015   clear drainage, per pt.   Stroke [facility])    Type II diabetes mellitus ([place])    "diet controlled" (11/08/2015)    ROS:   All systems reviewed and negative except as noted in the HPI.   Past Surgical History:  Procedure Laterality Date   ABDOMINAL AORTOGRAM W/LOWER EXTREMITY Bilateral 08/20/2020   Procedure: ABDOMINAL AORTOGRAM W/LOWER EXTREMITY;  Surgeon: Angelia Mould, MD;  Location: [place] [facility];  Service: Cardiovascular;  Laterality: Bilateral;   AMPUTATION Left 11/11/2015   Procedure: LEFT ABOVE KNEE AMPUTATION;  Surgeon: Angelia Mould, MD;  Location: [facility];  Service: Vascular;  Laterality: Left;   CARDIAC CATHETERIZATION  1990s; 04/16/2001   CATARACT EXTRACTION W/ INTRAOCULAR LENS  IMPLANT, BILATERAL Bilateral 09/2015   COLONOSCOPY N/A 11/27/2014   Procedure: COLONOSCOPY;  Surgeon: Rogene Houston, MD;  [facility];  Service: Endoscopy;  Laterality: N/A;  830   CORONARY ARTERY BYPASS GRAFT  1990's   "CABG X4"   ESOPHAGOGASTRODUODENOSCOPY N/A 02/14/2018   Procedure: ESOPHAGOGASTRODUODENOSCOPY (EGD);  Surgeon: Rogene Houston, MD;  [facility];  Service: Endoscopy;  Laterality: N/A;   ESOPHAGOGASTRODUODENOSCOPY (EGD) WITH PROPOFOL N/A 02/25/2018   Procedure: ESOPHAGOGASTRODUODENOSCOPY (EGD) WITH PROPOFOL;  Surgeon: Rogene Houston, MD;  [facility];  Service: Endoscopy;  Laterality: N/A;   FRACTURE SURGERY     GIVENS CAPSULE STUDY N/A 02/15/2018   Procedure:  GIVENS CAPSULE STUDY;  Surgeon: Rogene Houston, MD;  [facility];  Service: Endoscopy;  Laterality: N/A;   GIVENS CAPSULE STUDY N/A 03/27/2018   Procedure: GIVENS CAPSULE STUDY;  Surgeon: Rogene Houston, MD;  [facility];  Service: Endoscopy;  Laterality: N/A;   I & D EXTREMITY Left 11/10/2015   Procedure: INCISION AND DRAINAGE LEFT FOOT, AMPUTATION OF LEFT THIRD TOE;  Surgeon: Conrad Wauregan, MD;  Location: [facility];  Service: Vascular;  Laterality: Left;   INCISION AND DRAINAGE ABSCESS Left 06/16/2005   wide exc. abscess 5th toe   IR ANGIOGRAM VISCERAL SELECTIVE  03/28/2018   IR ANGIOGRAM VISCERAL SELECTIVE  03/28/2018   IR CT HEAD LTD  01/04/2019   IR PERCUTANEOUS  ART THROMBECTOMY/INFUSION INTRACRANIAL INC DIAG ANGIO  01/04/2019   IR US GUIDE VASC ACCESS LEFT  03/28/2018   MANDIBULAR HARDWARE REMOVAL Left 08/02/2015   Procedure:  HARDWARE REMOVAL TWO MANDIBULAR SCREWS;  Surgeon: Jannette Fogo, DDS;  Location: [facility];  Service: Oral Surgery;  Laterality: Left;   ORIF TIBIA FRACTURE Left ~ [place] N/A 08/14/2016   Procedure: Abdominal Aortogram w/Lower Extremity;  Surgeon: Angelia Mould, MD;  Location: [place] [facility];  Service: Cardiovascular;  Laterality: N/A;   PERIPHERAL VASCULAR INTERVENTION Bilateral 08/20/2020   Procedure: PERIPHERAL VASCULAR INTERVENTION;  Surgeon: Angelia Mould, MD;  Location: [place] [facility];  Service: Cardiovascular;  Laterality: Bilateral;  iliac stents   RADIOLOGY WITH ANESTHESIA N/A 01/04/2019   Procedure: CODE STROKE;  Surgeon: Radiologist, Medication, MD;  Location: [facility];  Service: Radiology;  Laterality: N/A;   SQUAMOUS CELL CARCINOMA EXCISION Left 1993   "took my jaw out; got cadavar in there now"   TONSILLECTOMY       Family History  Problem Relation Age of Onset   Heart disease Mother      Social History   Socioeconomic History   Marital status: Married     Spouse name: Not on file   Number of children: Not on file   Years of education: Not on file   Highest education level: Not on file  Occupational History   Not on file  Tobacco Use   Smoking status: Never    Passive exposure: Never   Smokeless tobacco: Never  Vaping Use   Vaping Use: Never used  Substance and Sexual Activity   Alcohol use: No    Alcohol/week: 0.0 standard drinks of alcohol   Drug use: No   Sexual activity: Yes  Other Topics Concern   Not on file  Social History Narrative   Not on file   Social Determinants of Health   Financial Resource Strain: Not on file  Food Insecurity: No Food Insecurity (08/11/2022)   Hunger Vital Sign    Worried About Running Out of Food in the Last Year: Never true    Ran Out of Food in the Last Year: Never true  Transportation Needs: No Transportation Needs (08/11/2022)   PRAPARE - Hydrologist (Medical): No    Lack of Transportation (Non-Medical): No  Physical Activity: Not on file  Stress: Not on file  Social Connections: Not on file  Intimate Partner Violence: Not At Risk (08/11/2022)   Humiliation, Afraid, Rape, and Kick questionnaire    Fear of Current or Ex-Partner: No    Emotionally Abused: No    Physically Abused: No    Sexually Abused: No     BP 110/68 (BP Location: Left Arm, Patient Position: Sitting, Cuff Size: Normal)   Pulse (!) 55   Ht 5' 9"  (1.753 m)   Wt 150 lb 12.8 oz (68.4 kg)   SpO2 100%   BMI 22.27 kg/m   Physical Exam:  Well appearing elderly man wearing a life vest, NAD HEENT: Unremarkable Neck:  No JVD, no thyromegally Lymphatics:  No adenopathy Back:  No CVA tenderness Lungs:  Clear with no wheezes HEART:  Regular rate rhythm, no murmurs, no rubs, no clicks Abd:  soft, positive bowel sounds, no organomegally, no rebound, no guarding Ext:  2 plus pulses, no edema, no cyanosis, no clubbing; left AKA Skin:  No rashes no nodules Neuro:  CN II through XII intact,  motor grossly intact   Assess/Plan: ICM - he denies anginal symptoms and he has class 2 CHF. His advanced age and comorbidities make him a very poor candidate for ICD for primary prevention. I recommended he take off his Life Vest and turn it in.  Perm Atrial fib - his VR is controlled. No change in meds. Dyslipidemia - he will continue his statin therapy.  Carleene Overlie Lyncoln Ledgerwood,MD

## 2022-09-05 NOTE — Patient Instructions (Signed)
Medication Instructions:  Your physician recommends that you continue on your current medications as directed. Please refer to the Current Medication list given to you today.  May take off Life Vest   *If you need a refill on your cardiac medications before your next appointment, please call your pharmacy*   Lab Work: NONE   If you have labs (blood work) drawn today and your tests are completely normal, you will receive your results only by: [place] (if you have MyChart) OR A paper copy in the mail If you have any lab test that is abnormal or we need to change your treatment, we will call you to review the results.   Testing/Procedures: NONE    Follow-Up: At [facility], you and your health needs are our priority.  As part of our continuing mission to provide you with exceptional heart care, we have created designated Provider Care Teams.  These Care Teams include your primary Cardiologist (physician) and Advanced Practice Providers (APPs -  Physician Assistants and Nurse Practitioners) who all work together to provide you with the care you need, when you need it.  We recommend signing up for the patient portal called "MyChart".  Sign up information is provided on this After Visit Summary.  MyChart is used to connect with patients for Virtual Visits (Telemedicine).  Patients are able to view lab/test results, encounter notes, upcoming appointments, etc.  Non-urgent messages can be sent to your provider as well.   To learn more about what you can do with MyChart, go to [email or web address].    Your next appointment:    As Needed   The format for your next appointment:   In Person  Provider:   Cristopher Peru, MD    Other Instructions Thank you for choosing [facility]!    Important Information About [place]

## 2022-10-12 ENCOUNTER — Telehealth: Payer: Self-pay | Admitting: *Deleted

## 2022-10-12 ENCOUNTER — Other Ambulatory Visit: Payer: Self-pay | Admitting: *Deleted

## 2022-10-12 DIAGNOSIS — I739 Peripheral vascular disease, unspecified: Secondary | ICD-10-CM

## 2022-10-12 DIAGNOSIS — I70221 Atherosclerosis of native arteries of extremities with rest pain, right leg: Secondary | ICD-10-CM

## 2022-10-12 NOTE — Telephone Encounter (Signed)
Wife called stating patient had a wound on "top of right great toe and on the bottom of the same toe." There is drainage present and pain.  Denies fever or warmth.  Patient is requesting to see Dr Scot Dock as he is concerned of the possibility of another AKA.  Appointment was scheduled 10/26/2022 for office visit and ultrasounds.  Patient knows to call if symptoms worsen.

## 2022-10-26 ENCOUNTER — Ambulatory Visit (INDEPENDENT_AMBULATORY_CARE_PROVIDER_SITE_OTHER)
Admission: RE | Admit: 2022-10-26 | Discharge: 2022-10-26 | Disposition: A | Discharge status: Home / Self Care | Payer: Medicare Other | Source: Ambulatory Visit | Attending: Vascular Surgery | Admitting: Vascular Surgery

## 2022-10-26 ENCOUNTER — Ambulatory Visit (INDEPENDENT_AMBULATORY_CARE_PROVIDER_SITE_OTHER): Payer: Medicare Other | Admitting: Vascular Surgery

## 2022-10-26 ENCOUNTER — Encounter: Payer: Self-pay | Admitting: Vascular Surgery

## 2022-10-26 ENCOUNTER — Encounter (HOSPITAL_COMMUNITY): Payer: Medicare Other

## 2022-10-26 ENCOUNTER — Ambulatory Visit: Payer: Medicare Other | Admitting: Vascular Surgery

## 2022-10-26 ENCOUNTER — Ambulatory Visit (HOSPITAL_COMMUNITY)
Admission: RE | Admit: 2022-10-26 | Discharge: 2022-10-26 | Disposition: A | Discharge status: Home / Self Care | Payer: Medicare Other | Source: Ambulatory Visit | Attending: Vascular Surgery | Admitting: Vascular Surgery

## 2022-10-26 ENCOUNTER — Other Ambulatory Visit: Payer: Self-pay

## 2022-10-26 VITALS — BP 143/86 | HR 86 | Temp 97.7°F | Resp 20 | Ht 69.0 in | Wt 150.0 lb

## 2022-10-26 DIAGNOSIS — L97909 Non-pressure chronic ulcer of unspecified part of unspecified lower leg with unspecified severity: Secondary | ICD-10-CM | POA: Diagnosis not present

## 2022-10-26 DIAGNOSIS — I70221 Atherosclerosis of native arteries of extremities with rest pain, right leg: Secondary | ICD-10-CM

## 2022-10-26 DIAGNOSIS — I739 Peripheral vascular disease, unspecified: Secondary | ICD-10-CM | POA: Insufficient documentation

## 2022-10-26 DIAGNOSIS — I70299 Other atherosclerosis of native arteries of extremities, unspecified extremity: Secondary | ICD-10-CM | POA: Diagnosis not present

## 2022-10-26 NOTE — Progress Notes (Signed)
REASON FOR VISIT:   Wound on the right foot.  MEDICAL ISSUES:   PERIPHERAL ARTERIAL DISEASE WITH ULCERATION RIGHT FOOT: This patient has known infrainguinal arterial occlusive disease.  He now has a wound on the right great toe with critical limb ischemia.  This is clearly a limb threatening situation.  Unfortunately his iliac stents were not studied today but he had a triphasic femoral waveform on the right and he does have a femoral pulse bilaterally.  The situation is further complicated by his chronic kidney disease.  His GFR is 34.  I recommend that we proceed with arteriography to see if there are any options for revascularization.  I explained that if we see something that be done from an endovascular standpoint this would be done at the same time.  I have scheduled the case for next Tuesday with Dr. Trula Slade.  We have discussed the indications for the procedure and the potential complications and he is agreeable to proceed.  We will have him stop his Eliquis 48 hours prior to the procedure.  He will take his last dose on Saturday.  He does have underlying chronic kidney disease.  His most recent GFR was 34.  I explained that the arteriogram will be done with CO2 and limited contrast.  HPI:   [name] is a pleasant 87 y.o. male who I last saw on 04/06/2022.  This patient had undergone bilateral common iliac artery stents in 2021.  He had presented with a nonhealing wound of the right foot which ultimately healed.  When I saw him last he did have evidence of infrainguinal arterial occlusive disease on the right but he was asymptomatic.  He was not a smoker.  He also has chronic venous insufficiency.  I ordered follow-up ABIs and a duplex of his stents in 1 year which would have been June of this year.  He developed some drainage from a wound on the right great toe and is seen for this.  Of note, this patient has a left AKA.  On my history, the patient developed some drainage from  the right great toe about 3 weeks ago.  He is seen by his podiatrist and [place].  He has significant redness to the entire right leg below the knee and is putting ammonium lactate on that.  For the toe wound he has been putting Silvadene and dry gauze.  His activity is limited because of his left above-the-knee amputation.  He denies any history of rest pain.  He does have a history of chronic venous insufficiency.  He is on Eliquis for atrial fibrillation.  Past Medical History:  Diagnosis Date   Arteriosclerotic cardiovascular disease (ASCVD)    CABG in 1990s; 2002 total obstruction of LAD, CX and RCA with patent grafts and nl EF; Stress nuc. 2008 - mild LV dilation; normal EF; questionable small anteroapical scar; no ischemia   Arthritis    "left knee" (11/08/2015)   Atrial fibrillation ([place])    Cellulitis 11/08/2015   "both feet"   Chronic anticoagulation    Dental crowns present    Diabetic peripheral neuropathy ([place])    bilateral lower leg, hands   History of blood transfusion    "related to my leg surgery?"   Hypertension    states under control with meds., has been on med. x "long time"   Iron deficiency anemia    Jaw cancer ([place]) 1990s   "squamous cell"   Myocardial infarction ([place]) 1990s   "  after my jaw OR"   Pedal edema    "not a lot", per pt.   Peripheral vascular disease ([place])    Presence of retained hardware 07/2015   failed hardware jaw   Runny nose 07/27/2015   clear drainage, per pt.   Stroke ([place])    Type II diabetes mellitus ([place])    "diet controlled" (11/08/2015)    Family History  Problem Relation Age of Onset   Heart disease Mother     SOCIAL HISTORY: Social History   Tobacco Use   Smoking status: Never    Passive exposure: Never   Smokeless tobacco: Never  Substance Use Topics   Alcohol use: No    Alcohol/week: 0.0 standard drinks of alcohol    Allergies  Allergen Reactions   Lipitor [Atorvastatin] Other (See Comments)    SEVERE  HEADACHE   Simvastatin Other (See Comments)    MUSCLE ACHES   Penicillins Rash    Has patient had a PCN reaction causing immediate rash, facial/tongue/throat swelling, SOB or lightheadedness with hypotension: Yes Has patient had a PCN reaction causing severe rash involving mucus membranes or skin necrosis: No Has patient had a PCN reaction that required hospitalization No Has patient had a PCN reaction occurring within the last 10 years: No If all of the above answers are "NO", then may proceed with Cephalosporin use.    Sulfa Antibiotics Rash    Tolerates silver sulfadiazine cream at home    Current Outpatient Medications  Medication Sig Dispense Refill   acetaminophen (TYLENOL) 325 MG tablet Take 2 tablets (650 mg total) by mouth every 6 (six) hours as needed for moderate pain. (Patient taking differently: Take 1,000 mg by mouth every 6 (six) hours as needed for moderate pain.) 30 tablet 0   ammonium lactate (AMLACTIN) 12 % cream Apply topically 2 (two) times daily.     apixaban (ELIQUIS) 2.5 MG TABS tablet Take 1 tablet (2.5 mg total) by mouth 2 (two) times daily. 60 tablet 1   calcium-vitamin D (OSCAL WITH D) 500-5 MG-MCG tablet Take 2 tablets by mouth 2 (two) times daily. 60 tablet 2   carboxymethylcellul-glycerin (OPTIVE) 0.5-0.9 % ophthalmic solution Place 1 drop into both eyes daily as needed for dry eyes.     cetirizine (ZYRTEC) 10 MG tablet Take 10 mg by mouth daily.     dapagliflozin propanediol (FARXIGA) 10 MG TABS tablet Take 1 tablet (10 mg total) by mouth daily. 90 tablet 3   diphenoxylate-atropine (LOMOTIL) 2.5-0.025 MG tablet Take 1 tablet by mouth 4 (four) times daily as needed for diarrhea or loose stools. 30 tablet 1   feeding supplement (BOOST / RESOURCE BREEZE) LIQD Take 1 Container by mouth 2 (two) times daily between meals.     furosemide (LASIX) 40 MG tablet Take 1 tablet (40 mg total) by mouth daily. 30 tablet 3   halobetasol (ULTRAVATE) 0.05 % cream Apply  topically.     magnesium oxide (MAG-OX) 400 (240 Mg) MG tablet Take 1 tablet (400 mg total) by mouth daily. 30 tablet 3   metoprolol succinate (TOPROL-XL) 25 MG 24 hr tablet Take 0.5 tablets (12.5 mg total) by mouth daily. 90 tablet 3   nystatin cream (MYCOSTATIN) Apply 1 application topically 3 (three) times daily as needed (skin irritation/rash.).      Omega-3 Fatty Acids (FISH OIL) 1200 MG CAPS Take 1,200 mg by mouth daily.     Pediatric Multivitamins-Iron (FLINTSTONES PLUS IRON PO) Take 1 tablet by mouth daily.  potassium chloride SA (KLOR-CON M) 20 MEQ tablet Take 2 tablets (40 mEq total) by mouth daily. 60 tablet 3   pravastatin (PRAVACHOL) 20 MG tablet Take 1 tablet (20 mg total) by mouth every evening. 30 tablet 3   Probiotic Product (PHILLIPS COLON HEALTH PO) Take 1 capsule by mouth daily.      silver sulfADIAZINE (SILVADENE) 1 % cream Apply 1 application topically daily.     No current facility-administered medications for this visit.    REVIEW OF SYSTEMS:  '[X]'$  denotes positive finding, '[ ]'$  denotes negative finding Cardiac  Comments:  Chest pain or chest pressure:    Shortness of breath upon exertion:    Short of breath when lying flat:    Irregular heart rhythm:        Vascular    Pain in calf, thigh, or hip brought on by ambulation:    Pain in feet at night that wakes you up from your sleep:     Blood clot in your veins:    Leg swelling:         Pulmonary    Oxygen at home:    Productive cough:     Wheezing:         Neurologic    Sudden weakness in arms or legs:     Sudden numbness in arms or legs:     Sudden onset of difficulty speaking or slurred speech:    Temporary loss of vision in one eye:     Problems with dizziness:         Gastrointestinal    Blood in stool:     Vomited blood:         Genitourinary    Burning when urinating:     Blood in urine:        Psychiatric    Major depression:         Hematologic    Bleeding problems:    Problems  with blood clotting too easily:        Skin    Rashes or ulcers: x       Constitutional    Fever or chills:     PHYSICAL EXAM:   Vitals:   10/26/22 1020  BP: (!) 143/86  Pulse: 86  Resp: 20  Temp: 97.7 F (36.5 C)  SpO2: 98%  Weight: 150 lb (68 kg)  Height: '5\' 9"'$  (1.753 m)    GENERAL: The patient is a well-nourished male, in no acute distress. The vital signs are documented above. CARDIAC: There is a regular rate and rhythm.  VASCULAR: I do not detect carotid bruits. It was difficult to assess his femoral pulses as he is in a wheelchair but with some difficulty I was able to palpate a femoral pulse bilaterally. He has monophasic Doppler signals in the right foot. He has erythema of the right leg from the knee down. He has a wound on the plantar aspect of the right great toe.       PULMONARY: There is good air exchange bilaterally without wheezing or rales. ABDOMEN: Soft and non-tender with normal pitched bowel sounds.  MUSCULOSKELETAL: The patient has a left AKA. NEUROLOGIC: No focal weakness or paresthesias are detected. SKIN: There are no ulcers or rashes noted. PSYCHIATRIC: The patient has a normal affect.  DATA:    ARTERIAL DOPPLER STUDY: I have independently interpreted his arterial Doppler study today.  This was of the right lower extremity only.  He has a left AKA.  There  is a monophasic dorsalis pedis signal with a multiphasic posterior tibial signal.  The arteries were calcified and could not be compressed.  Toe pressures 48 mmHg.  RIGHT LOWER EXTREMITY DUPLEX: I have independently interpreted his right lower extremity arterial duplex.  There is a mild stenosis noted in the common femoral artery and deep femoral artery.  He has triphasic signals in the common femoral artery on the right.  He has monophasic signals distally.  Deitra Mayo [facility] [phone]

## 2022-10-26 NOTE — H&P (View-Only) (Signed)
REASON FOR VISIT:   Wound on the right foot.  MEDICAL ISSUES:   PERIPHERAL ARTERIAL DISEASE WITH ULCERATION RIGHT FOOT: This patient has known infrainguinal arterial occlusive disease.  He now has a wound on the right great toe with critical limb ischemia.  This is clearly a limb threatening situation.  Unfortunately his iliac stents were not studied today but he had a triphasic femoral waveform on the right and he does have a femoral pulse bilaterally.  The situation is further complicated by his chronic kidney disease.  His GFR is 34.  I recommend that we proceed with arteriography to see if there are any options for revascularization.  I explained that if we see something that be done from an endovascular standpoint this would be done at the same time.  I have scheduled the case for next Tuesday with Dr. Trula Slade.  We have discussed the indications for the procedure and the potential complications and he is agreeable to proceed.  We will have him stop his Eliquis 48 hours prior to the procedure.  He will take his last dose on Saturday.  He does have underlying chronic kidney disease.  His most recent GFR was 34.  I explained that the arteriogram will be done with CO2 and limited contrast.  HPI:   [name] is a pleasant 87 y.o. male who I last saw on 04/06/2022.  This patient had undergone bilateral common iliac artery stents in 2021.  He had presented with a nonhealing wound of the right foot which ultimately healed.  When I saw him last he did have evidence of infrainguinal arterial occlusive disease on the right but he was asymptomatic.  He was not a smoker.  He also has chronic venous insufficiency.  I ordered follow-up ABIs and a duplex of his stents in 1 year which would have been June of this year.  He developed some drainage from a wound on the right great toe and is seen for this.  Of note, this patient has a left AKA.  On my history, the patient developed some drainage from  the right great toe about 3 weeks ago.  He is seen by his podiatrist and [place].  He has significant redness to the entire right leg below the knee and is putting ammonium lactate on that.  For the toe wound he has been putting Silvadene and dry gauze.  His activity is limited because of his left above-the-knee amputation.  He denies any history of rest pain.  He does have a history of chronic venous insufficiency.  He is on Eliquis for atrial fibrillation.  Past Medical History:  Diagnosis Date   Arteriosclerotic cardiovascular disease (ASCVD)    CABG in 1990s; 2002 total obstruction of LAD, CX and RCA with patent grafts and nl EF; Stress nuc. 2008 - mild LV dilation; normal EF; questionable small anteroapical scar; no ischemia   Arthritis    "left knee" (11/08/2015)   Atrial fibrillation ([place])    Cellulitis 11/08/2015   "both feet"   Chronic anticoagulation    Dental crowns present    Diabetic peripheral neuropathy ([place])    bilateral lower leg, hands   History of blood transfusion    "related to my leg surgery?"   Hypertension    states under control with meds., has been on med. x "long time"   Iron deficiency anemia    Jaw cancer ([place]) 1990s   "squamous cell"   Myocardial infarction ([place]) 1990s   "  after my jaw OR"   Pedal edema    "not a lot", per pt.   Peripheral vascular disease ([place])    Presence of retained hardware 07/2015   failed hardware jaw   Runny nose 07/27/2015   clear drainage, per pt.   Stroke ([place])    Type II diabetes mellitus ([place])    "diet controlled" (11/08/2015)    Family History  Problem Relation Age of Onset   Heart disease Mother     SOCIAL HISTORY: Social History   Tobacco Use   Smoking status: Never    Passive exposure: Never   Smokeless tobacco: Never  Substance Use Topics   Alcohol use: No    Alcohol/week: 0.0 standard drinks of alcohol    Allergies  Allergen Reactions   Lipitor [Atorvastatin] Other (See Comments)    SEVERE  HEADACHE   Simvastatin Other (See Comments)    MUSCLE ACHES   Penicillins Rash    Has patient had a PCN reaction causing immediate rash, facial/tongue/throat swelling, SOB or lightheadedness with hypotension: Yes Has patient had a PCN reaction causing severe rash involving mucus membranes or skin necrosis: No Has patient had a PCN reaction that required hospitalization No Has patient had a PCN reaction occurring within the last 10 years: No If all of the above answers are "NO", then may proceed with Cephalosporin use.    Sulfa Antibiotics Rash    Tolerates silver sulfadiazine cream at home    Current Outpatient Medications  Medication Sig Dispense Refill   acetaminophen (TYLENOL) 325 MG tablet Take 2 tablets (650 mg total) by mouth every 6 (six) hours as needed for moderate pain. (Patient taking differently: Take 1,000 mg by mouth every 6 (six) hours as needed for moderate pain.) 30 tablet 0   ammonium lactate (AMLACTIN) 12 % cream Apply topically 2 (two) times daily.     apixaban (ELIQUIS) 2.5 MG TABS tablet Take 1 tablet (2.5 mg total) by mouth 2 (two) times daily. 60 tablet 1   calcium-vitamin D (OSCAL WITH D) 500-5 MG-MCG tablet Take 2 tablets by mouth 2 (two) times daily. 60 tablet 2   carboxymethylcellul-glycerin (OPTIVE) 0.5-0.9 % ophthalmic solution Place 1 drop into both eyes daily as needed for dry eyes.     cetirizine (ZYRTEC) 10 MG tablet Take 10 mg by mouth daily.     dapagliflozin propanediol (FARXIGA) 10 MG TABS tablet Take 1 tablet (10 mg total) by mouth daily. 90 tablet 3   diphenoxylate-atropine (LOMOTIL) 2.5-0.025 MG tablet Take 1 tablet by mouth 4 (four) times daily as needed for diarrhea or loose stools. 30 tablet 1   feeding supplement (BOOST / RESOURCE BREEZE) LIQD Take 1 Container by mouth 2 (two) times daily between meals.     furosemide (LASIX) 40 MG tablet Take 1 tablet (40 mg total) by mouth daily. 30 tablet 3   halobetasol (ULTRAVATE) 0.05 % cream Apply  topically.     magnesium oxide (MAG-OX) 400 (240 Mg) MG tablet Take 1 tablet (400 mg total) by mouth daily. 30 tablet 3   metoprolol succinate (TOPROL-XL) 25 MG 24 hr tablet Take 0.5 tablets (12.5 mg total) by mouth daily. 90 tablet 3   nystatin cream (MYCOSTATIN) Apply 1 application topically 3 (three) times daily as needed (skin irritation/rash.).      Omega-3 Fatty Acids (FISH OIL) 1200 MG CAPS Take 1,200 mg by mouth daily.     Pediatric Multivitamins-Iron (FLINTSTONES PLUS IRON PO) Take 1 tablet by mouth daily.  potassium chloride SA (KLOR-CON M) 20 MEQ tablet Take 2 tablets (40 mEq total) by mouth daily. 60 tablet 3   pravastatin (PRAVACHOL) 20 MG tablet Take 1 tablet (20 mg total) by mouth every evening. 30 tablet 3   Probiotic Product (PHILLIPS COLON HEALTH PO) Take 1 capsule by mouth daily.      silver sulfADIAZINE (SILVADENE) 1 % cream Apply 1 application topically daily.     No current facility-administered medications for this visit.    REVIEW OF SYSTEMS:  '[X]'$  denotes positive finding, '[ ]'$  denotes negative finding Cardiac  Comments:  Chest pain or chest pressure:    Shortness of breath upon exertion:    Short of breath when lying flat:    Irregular heart rhythm:        Vascular    Pain in calf, thigh, or hip brought on by ambulation:    Pain in feet at night that wakes you up from your sleep:     Blood clot in your veins:    Leg swelling:         Pulmonary    Oxygen at home:    Productive cough:     Wheezing:         Neurologic    Sudden weakness in arms or legs:     Sudden numbness in arms or legs:     Sudden onset of difficulty speaking or slurred speech:    Temporary loss of vision in one eye:     Problems with dizziness:         Gastrointestinal    Blood in stool:     Vomited blood:         Genitourinary    Burning when urinating:     Blood in urine:        Psychiatric    Major depression:         Hematologic    Bleeding problems:    Problems  with blood clotting too easily:        Skin    Rashes or ulcers: x       Constitutional    Fever or chills:     PHYSICAL EXAM:   Vitals:   10/26/22 1020  BP: (!) 143/86  Pulse: 86  Resp: 20  Temp: 97.7 F (36.5 C)  SpO2: 98%  Weight: 150 lb (68 kg)  Height: '5\' 9"'$  (1.753 m)    GENERAL: The patient is a well-nourished male, in no acute distress. The vital signs are documented above. CARDIAC: There is a regular rate and rhythm.  VASCULAR: I do not detect carotid bruits. It was difficult to assess his femoral pulses as he is in a wheelchair but with some difficulty I was able to palpate a femoral pulse bilaterally. He has monophasic Doppler signals in the right foot. He has erythema of the right leg from the knee down. He has a wound on the plantar aspect of the right great toe.       PULMONARY: There is good air exchange bilaterally without wheezing or rales. ABDOMEN: Soft and non-tender with normal pitched bowel sounds.  MUSCULOSKELETAL: The patient has a left AKA. NEUROLOGIC: No focal weakness or paresthesias are detected. SKIN: There are no ulcers or rashes noted. PSYCHIATRIC: The patient has a normal affect.  DATA:    ARTERIAL DOPPLER STUDY: I have independently interpreted his arterial Doppler study today.  This was of the right lower extremity only.  He has a left AKA.  There  is a monophasic dorsalis pedis signal with a multiphasic posterior tibial signal.  The arteries were calcified and could not be compressed.  Toe pressures 48 mmHg.  RIGHT LOWER EXTREMITY DUPLEX: I have independently interpreted his right lower extremity arterial duplex.  There is a mild stenosis noted in the common femoral artery and deep femoral artery.  He has triphasic signals in the common femoral artery on the right.  He has monophasic signals distally.  Deitra Mayo [facility] [phone]

## 2022-10-31 ENCOUNTER — Other Ambulatory Visit: Payer: Self-pay

## 2022-10-31 ENCOUNTER — Encounter (HOSPITAL_COMMUNITY)
Admission: RE | Disposition: A | Discharge status: Home / Self Care | Payer: Self-pay | Source: Home / Self Care | Attending: Surgery

## 2022-10-31 ENCOUNTER — Ambulatory Visit (HOSPITAL_COMMUNITY)
Admission: RE | Admit: 2022-10-31 | Discharge: 2022-10-31 | Disposition: A | Discharge status: Home / Self Care | Payer: Medicare Other | Attending: Surgery | Admitting: Surgery

## 2022-10-31 DIAGNOSIS — E11621 Type 2 diabetes mellitus with foot ulcer: Secondary | ICD-10-CM | POA: Diagnosis not present

## 2022-10-31 DIAGNOSIS — Z89612 Acquired absence of left leg above knee: Secondary | ICD-10-CM | POA: Diagnosis not present

## 2022-10-31 DIAGNOSIS — I70235 Atherosclerosis of native arteries of right leg with ulceration of other part of foot: Secondary | ICD-10-CM | POA: Insufficient documentation

## 2022-10-31 DIAGNOSIS — I129 Hypertensive chronic kidney disease with stage 1 through stage 4 chronic kidney disease, or unspecified chronic kidney disease: Secondary | ICD-10-CM | POA: Insufficient documentation

## 2022-10-31 DIAGNOSIS — E1122 Type 2 diabetes mellitus with diabetic chronic kidney disease: Secondary | ICD-10-CM | POA: Diagnosis not present

## 2022-10-31 DIAGNOSIS — N189 Chronic kidney disease, unspecified: Secondary | ICD-10-CM | POA: Diagnosis not present

## 2022-10-31 DIAGNOSIS — Z7901 Long term (current) use of anticoagulants: Secondary | ICD-10-CM | POA: Diagnosis not present

## 2022-10-31 DIAGNOSIS — I70239 Atherosclerosis of native arteries of right leg with ulceration of unspecified site: Secondary | ICD-10-CM | POA: Diagnosis not present

## 2022-10-31 DIAGNOSIS — L97519 Non-pressure chronic ulcer of other part of right foot with unspecified severity: Secondary | ICD-10-CM | POA: Diagnosis not present

## 2022-10-31 DIAGNOSIS — I4891 Unspecified atrial fibrillation: Secondary | ICD-10-CM | POA: Insufficient documentation

## 2022-10-31 DIAGNOSIS — E1151 Type 2 diabetes mellitus with diabetic peripheral angiopathy without gangrene: Secondary | ICD-10-CM | POA: Insufficient documentation

## 2022-10-31 DIAGNOSIS — Z7984 Long term (current) use of oral hypoglycemic drugs: Secondary | ICD-10-CM | POA: Insufficient documentation

## 2022-10-31 DIAGNOSIS — L97909 Non-pressure chronic ulcer of unspecified part of unspecified lower leg with unspecified severity: Secondary | ICD-10-CM

## 2022-10-31 HISTORY — PX: ABDOMINAL AORTOGRAM W/LOWER EXTREMITY: CATH118223

## 2022-10-31 LAB — POCT I-STAT, CHEM 8
BUN: 62 mg/dL — ABNORMAL HIGH (ref 8–23)
Calcium, Ion: 1.06 mmol/L — ABNORMAL LOW (ref 1.15–1.40)
Chloride: 106 mmol/L (ref 98–111)
Creatinine, Ser: 1.9 mg/dL — ABNORMAL HIGH (ref 0.61–1.24)
Glucose, Bld: 105 mg/dL — ABNORMAL HIGH (ref 70–99)
HCT: 33 % — ABNORMAL LOW (ref 39.0–52.0)
Hemoglobin: 11.2 g/dL — ABNORMAL LOW (ref 13.0–17.0)
Potassium: 4.8 mmol/L (ref 3.5–5.1)
Sodium: 140 mmol/L (ref 135–145)
TCO2: 23 mmol/L (ref 22–32)

## 2022-10-31 SURGERY — ABDOMINAL AORTOGRAM W/LOWER EXTREMITY
Anesthesia: LOCAL | Laterality: Right

## 2022-10-31 MED ORDER — HYDRALAZINE HCL 20 MG/ML IJ SOLN: 5.0000 mg | INTRAMUSCULAR | Status: DC | PRN

## 2022-10-31 MED ORDER — FENTANYL CITRATE (PF) 100 MCG/2ML IJ SOLN
INTRAMUSCULAR | Status: AC
  Filled 2022-10-31: qty 2

## 2022-10-31 MED ORDER — MIDAZOLAM HCL 2 MG/2ML IJ SOLN
INTRAMUSCULAR | Status: AC
  Filled 2022-10-31: qty 2

## 2022-10-31 MED ORDER — FENTANYL CITRATE (PF) 100 MCG/2ML IJ SOLN
INTRAMUSCULAR | Status: DC | PRN
  Administered 2022-10-31: 50 ug via INTRAVENOUS

## 2022-10-31 MED ORDER — SODIUM CHLORIDE 0.9 % IV SOLN: INTRAVENOUS | Status: DC

## 2022-10-31 MED ORDER — SODIUM CHLORIDE 0.9% FLUSH: 3.0000 mL | Freq: Two times a day (BID) | INTRAVENOUS | Status: DC

## 2022-10-31 MED ORDER — SODIUM CHLORIDE 0.9% FLUSH: 3.0000 mL | INTRAVENOUS | Status: DC | PRN

## 2022-10-31 MED ORDER — MORPHINE SULFATE (PF) 2 MG/ML IV SOLN: 2.0000 mg | INTRAVENOUS | Status: DC | PRN

## 2022-10-31 MED ORDER — HEPARIN (PORCINE) IN NACL 1000-0.9 UT/500ML-% IV SOLN
INTRAVENOUS | Status: DC | PRN
  Administered 2022-10-31 (×2): 500 mL

## 2022-10-31 MED ORDER — ACETAMINOPHEN 325 MG PO TABS: 650.0000 mg | ORAL_TABLET | ORAL | Status: DC | PRN

## 2022-10-31 MED ORDER — HEPARIN (PORCINE) IN NACL 1000-0.9 UT/500ML-% IV SOLN
INTRAVENOUS | Status: AC
  Filled 2022-10-31: qty 500

## 2022-10-31 MED ORDER — OXYCODONE HCL 5 MG PO TABS: 5.0000 mg | ORAL_TABLET | ORAL | Status: DC | PRN

## 2022-10-31 MED ORDER — SODIUM CHLORIDE 0.9 % IV SOLN: 250.0000 mL | INTRAVENOUS | Status: DC | PRN

## 2022-10-31 MED ORDER — LIDOCAINE HCL (PF) 1 % IJ SOLN
INTRAMUSCULAR | Status: AC
  Filled 2022-10-31: qty 30

## 2022-10-31 MED ORDER — LABETALOL HCL 5 MG/ML IV SOLN: 10.0000 mg | INTRAVENOUS | Status: DC | PRN

## 2022-10-31 MED ORDER — MIDAZOLAM HCL 2 MG/2ML IJ SOLN
INTRAMUSCULAR | Status: DC | PRN
  Administered 2022-10-31: 2 mg via INTRAVENOUS

## 2022-10-31 MED ORDER — LIDOCAINE HCL (PF) 1 % IJ SOLN
INTRAMUSCULAR | Status: DC | PRN
  Administered 2022-10-31: 10 mL

## 2022-10-31 MED ORDER — SODIUM CHLORIDE 0.9 % WEIGHT BASED INFUSION: 1.0000 mL/kg/h | INTRAVENOUS | Status: DC

## 2022-10-31 SURGICAL SUPPLY — 15 items
CATH ANGIO 5F BER2 65CM (CATHETERS) IMPLANT
CATH OMNI FLUSH 5F 65CM (CATHETERS) IMPLANT
CATH SOFT-VU 4F 65 STRAIGHT (CATHETERS) IMPLANT
CATH SOFT-VU STRAIGHT 4F 65CM (CATHETERS) ×1
DEVICE TORQUE H2O (MISCELLANEOUS) IMPLANT
DEVICE VASC CLSR CELT ART 5 (Vascular Products) IMPLANT
GUIDEWIRE ANGLED .035X150CM (WIRE) IMPLANT
KIT ANGIASSIST CO2 SYSTEM (KITS) IMPLANT
KIT MICROPUNCTURE NIT STIFF (SHEATH) IMPLANT
KIT PV (KITS) ×2 IMPLANT
SHEATH PINNACLE 5F 10CM (SHEATH) IMPLANT
SHEATH PROBE COVER 6X72 (BAG) IMPLANT
TRANSDUCER W/STOPCOCK (MISCELLANEOUS) ×2 IMPLANT
TRAY PV CATH (CUSTOM PROCEDURE TRAY) ×2 IMPLANT
WIRE BENTSON .035X145CM (WIRE) IMPLANT

## 2022-10-31 NOTE — Interval H&P Note (Signed)
History and Physical Interval Note:  10/31/2022 11:14 AM  [name]  has presented today for surgery, with the diagnosis of PAD with ulcer.  The various methods of treatment have been discussed with the patient and family. After consideration of risks, benefits and other options for treatment, the patient has consented to  Procedure(s): ABDOMINAL AORTOGRAM W/LOWER EXTREMITY (N/A) as a surgical intervention.  The patient's history has been reviewed, patient examined, no change in status, stable for surgery.  I have reviewed the patient's chart and labs.  Questions were answered to the patient's satisfaction.     [name]

## 2022-10-31 NOTE — Op Note (Signed)
    Patient name: [name] MRN: [id number] DOB: [birth year] Sex: male  10/31/2022 Pre-operative Diagnosis: Right leg ulcer Post-operative diagnosis:  Same Surgeon:  [name] Procedure Performed:  1.  Ultrasound-guided access, left femoral artery  2.  Abdominal aortogram with CO2  3.  Right lower extremity runoff  4.  Third order catheterization  5.  Conscious sedation, 46 minutes  6.  Closure device, Celt     Indications: 87 year old with right leg ulcer who comes in today for further evaluate patient  Procedure:  The patient was identified in the holding area and taken to room 8.  The patient was then placed supine on the table and prepped and draped in the usual sterile fashion.  A time out was called.  Conscious sedation was administered with the use of IV fentanyl and Versed under continuous physician and nurse monitoring.  Heart rate, blood pressure, and oxygen saturation were continuously monitored.  Total sedation time was 46 minutes.  Ultrasound was used to evaluate the left common femoral artery.  It was patent .  A digital ultrasound image was acquired.  A micropuncture needle was used to access the left common femoral artery under ultrasound guidance.  An 018 wire was advanced without resistance and a micropuncture sheath was placed.  The 018 wire was removed and a benson wire was placed.  The micropuncture sheath was exchanged for a 5 french sheath.  An omniflush catheter was advanced over the wire to the level of L-1.  An abdominal angiogram with CO2 was obtained.  Next, using the omniflush catheter and a benson wire, the aortic bifurcation was crossed and the catheter was placed into theright external iliac artery and right runoff was obtained.   Findings:   Aortogram: No evidence of renal artery stenosis.  The infrarenal abdominal aorta is widely patent.  Bilateral common iliac stents are widely patent.  Bilateral external iliac arteries are widely patent.  Right Lower  Extremity: The right common femoral and profundofemoral artery are widely patent.  The superficial femoral artery has a less than 30% stenosis at its origin.  The remaining portion is widely patent as is the popliteal artery.  There is two-vessel runoff via the posterior tibial and peroneal artery.  The posterior tibial artery crosses the ankle  Left Lower Extremity: Not evaluated  Intervention: Pullback pressures were performed from the mid superficial femoral artery on the right up into the aorta and there was no significant pressure gradient.  The groin was closed with a Celt  Impression:  #1  No significant aortic stenosis.  Bilateral iliac stents are widely patent  #2  Pullback pressure was performed in the mid superficial femoral artery on the right up into the aorta without any significant pressure gradient.  Posterior tibial artery crosses the ankle.  #3  No hemodynamically significant stenosis was visualized on the right leg   V. [name], M.D., [facility] [place] Office: [phone] Pager:  [phone]

## 2022-11-01 ENCOUNTER — Encounter (HOSPITAL_COMMUNITY): Payer: Self-pay | Admitting: Surgery

## 2022-11-02 ENCOUNTER — Telehealth: Payer: Self-pay | Admitting: Cardiology

## 2022-11-02 NOTE — Telephone Encounter (Signed)
Calling to say that the patient had a 3lb weight gain from yesterday. He didn't eat late last night, his lungs are clear, no adema in the right leg and no sob. Vital signs is stable Please advise

## 2022-11-03 NOTE — Telephone Encounter (Signed)
Jamie with [facility] notified and verbalized understanding.

## 2022-11-03 NOTE — Telephone Encounter (Signed)
HHN Jamie called office again to inquire about Lasix- Per Barbarann Ehlers, RN [facility] was advised to follow providers orders for Lasix- 40 mg BID x 3 days then resume 40 mg QD- track weights at home- update office on Monday with weights. HHN Jamie verbalized understanding

## 2022-11-03 NOTE — Telephone Encounter (Signed)
[name] spoke to [name] who stated that he has lost 1 lb since yesterday. [name] would like to know if patient should still take lasix as ordered by provider for 3 days. Please advise.

## 2022-12-06 ENCOUNTER — Encounter: Payer: Self-pay | Admitting: Medical

## 2022-12-06 ENCOUNTER — Ambulatory Visit: Payer: Medicare Other | Attending: Medical | Admitting: Medical

## 2022-12-06 VITALS — BP 136/62 | HR 67 | Ht 68.5 in | Wt 143.0 lb

## 2022-12-06 DIAGNOSIS — I4821 Permanent atrial fibrillation: Secondary | ICD-10-CM | POA: Insufficient documentation

## 2022-12-06 DIAGNOSIS — I2510 Atherosclerotic heart disease of native coronary artery without angina pectoris: Secondary | ICD-10-CM | POA: Insufficient documentation

## 2022-12-06 DIAGNOSIS — I5020 Unspecified systolic (congestive) heart failure: Secondary | ICD-10-CM | POA: Diagnosis not present

## 2022-12-06 DIAGNOSIS — N1832 Chronic kidney disease, stage 3b: Secondary | ICD-10-CM | POA: Diagnosis present

## 2022-12-06 DIAGNOSIS — I10 Essential (primary) hypertension: Secondary | ICD-10-CM | POA: Diagnosis not present

## 2022-12-06 DIAGNOSIS — E782 Mixed hyperlipidemia: Secondary | ICD-10-CM | POA: Insufficient documentation

## 2022-12-06 NOTE — Progress Notes (Signed)
Cardiology Office Note:    Date:  12/06/2022   ID:  [name], DOB [birth year], MRN [id number]  PCP:  Lemmie Evens, MD  [facility] Cardiologist:  Candee Furbish, MD  [facility] Electrophysiologist:  None   Referring MD: Lemmie Evens, MD   Chief Complaint: 3 month follow-up  History of Present Illness:    [name] is a 87 y.o. male with a hx of CAD status post CABG in 1990s, patent bypass grafts by cath in 2002, low risk NST in 2008, permanent A-fib, hypertension, hyperlipidemia, history of GI bleed, PAD status post L BKA and prior CVA who presents for follow-up.  She has a history of CAD status post CABG 1990s.  Cath in 6/thousand 2 showed patent bypass grafts.  Myoview in 2008 showed no ischemia.  Had a stroke in March 2020 with endovascular revascularization of the left MCA.  Echo in 2020 showed EF of 55%, mild AI, severe LAE.  A history of PAD status post left AKA and status post stent to the bilateral CIA in October 2021.  August 2023 the patient reported chest discomfort, activity was limited as he is mostly wheelchair-bound.  Carlton Adam was considered.  Some days later patient reported face numbness and labs were obtained.  This showed critically low calcium at 4.3 and K was 2.4.  He also had AKI with a creatinine of 4.28.  He was recommended he go to the ER and he was admitted and treated for AKI felt to be secondary to prerenal azotemia in the setting of decreased oral intake and significant GI loss.  HCTZ and lisinopril were discontinued.  Presented back to the ED in October for worsening dyspnea on exertion.  He was admitted for acute heart failure with a BNP of 2530.  Repeat echo showed EF was 20-25% with global hypokinesis, normal RV function and mild AI, mild MR and mild dilation of the aortic root at 42 mm.  Hemic workup was not pursued given renal function.  He was diuresed with IV Lasix drip and transition to Lasix 40 mg daily.  He was started on Toprol and  [place].  LifeVest was arranged prior to discharge.  Patient was seen in follow-up in November 2023 and patient and the family preferred conservative management.  Patient was referred to EP to consider antiarrhythmic therapy prior to removing LifeVest.  Patient saw EP 09/05/2022 and it was he was a poor candidate for ICD for primary prevention.  Is recommended he take off his LifeVest.  Patient underwent vascular cardiac cath 10/31/22 for right leg ulcer. This showed no significant stenosis.   Today, the patient wanted to discuss ICD placement, EP note was reviewed. He denies any swelling. He keeps his leg elevated. He denies shortness of breath or chest pain. He does not see a kidney doctor.   Past Medical History:  Diagnosis Date   Arteriosclerotic cardiovascular disease (ASCVD)    CABG in 1990s; 2002 total obstruction of LAD, CX and RCA with patent grafts and nl EF; Stress nuc. 2008 - mild LV dilation; normal EF; questionable small anteroapical scar; no ischemia   Arthritis    "left knee" (11/08/2015)   Atrial fibrillation ([place])    Cellulitis 11/08/2015   "both feet"   Chronic anticoagulation    Dental crowns present    Diabetic peripheral neuropathy ([place])    bilateral lower leg, hands   History of blood transfusion    "related to my leg surgery?"   Hypertension  states under control with meds., has been on med. x "long time"   Iron deficiency anemia    Jaw cancer ([place]) 1990s   "squamous cell"   Myocardial infarction ([place]) 1990s   "after my jaw OR"   Pedal edema    "not a lot", per pt.   Peripheral vascular disease ([place])    Presence of retained hardware 07/2015   failed hardware jaw   Runny nose 07/27/2015   clear drainage, per pt.   Stroke ([place])    Type II diabetes mellitus ([place])    "diet controlled" (11/08/2015)    Past Surgical History:  Procedure Laterality Date   ABDOMINAL AORTOGRAM W/LOWER EXTREMITY Bilateral 08/20/2020   Procedure: ABDOMINAL AORTOGRAM W/LOWER  EXTREMITY;  Surgeon: Angelia Mould, MD;  Location: [place] [facility];  Service: Cardiovascular;  Laterality: Bilateral;   ABDOMINAL AORTOGRAM W/LOWER EXTREMITY Right 10/31/2022   Procedure: ABDOMINAL AORTOGRAM W/LOWER EXTREMITY;  Surgeon: Serafina Mitchell, MD;  Location: [place] [facility];  Service: Cardiovascular;  Laterality: Right;   AMPUTATION Left 11/11/2015   Procedure: LEFT ABOVE KNEE AMPUTATION;  Surgeon: Angelia Mould, MD;  Location: [place];  Service: Vascular;  Laterality: Left;   CARDIAC CATHETERIZATION  1990s; 04/16/2001   CATARACT EXTRACTION W/ INTRAOCULAR LENS  IMPLANT, BILATERAL Bilateral 09/2015   COLONOSCOPY N/A 11/27/2014   Procedure: COLONOSCOPY;  Surgeon: Rogene Houston, MD;  [facility];  Service: Endoscopy;  Laterality: N/A;  830   CORONARY ARTERY BYPASS GRAFT  1990's   "CABG X4"   ESOPHAGOGASTRODUODENOSCOPY N/A 02/14/2018   Procedure: ESOPHAGOGASTRODUODENOSCOPY (EGD);  Surgeon: Rogene Houston, MD;  [facility];  Service: Endoscopy;  Laterality: N/A;   ESOPHAGOGASTRODUODENOSCOPY (EGD) WITH PROPOFOL N/A 02/25/2018   Procedure: ESOPHAGOGASTRODUODENOSCOPY (EGD) WITH PROPOFOL;  Surgeon: Rogene Houston, MD;  [facility];  Service: Endoscopy;  Laterality: N/A;   FRACTURE SURGERY     GIVENS CAPSULE STUDY N/A 02/15/2018   Procedure: GIVENS CAPSULE STUDY;  Surgeon: Rogene Houston, MD;  [facility];  Service: Endoscopy;  Laterality: N/A;   GIVENS CAPSULE STUDY N/A 03/27/2018   Procedure: GIVENS CAPSULE STUDY;  Surgeon: Rogene Houston, MD;  [facility];  Service: Endoscopy;  Laterality: N/A;   I & D EXTREMITY Left 11/10/2015   Procedure: INCISION AND DRAINAGE LEFT FOOT, AMPUTATION OF LEFT THIRD TOE;  Surgeon: Conrad Cassville, MD;  Location: [place];  Service: Vascular;  Laterality: Left;   INCISION AND DRAINAGE ABSCESS Left 06/16/2005   wide exc. abscess 5th toe   IR ANGIOGRAM VISCERAL SELECTIVE  03/28/2018   IR  ANGIOGRAM VISCERAL SELECTIVE  03/28/2018   IR CT HEAD LTD  01/04/2019   IR PERCUTANEOUS ART THROMBECTOMY/INFUSION INTRACRANIAL INC DIAG ANGIO  01/04/2019   IR US GUIDE VASC ACCESS LEFT  03/28/2018   MANDIBULAR HARDWARE REMOVAL Left 08/02/2015   Procedure:  HARDWARE REMOVAL TWO MANDIBULAR SCREWS;  Surgeon: Jannette Fogo, DDS;  Location: [place];  Service: Oral Surgery;  Laterality: Left;   ORIF TIBIA FRACTURE Left ~ [place] N/A 08/14/2016   Procedure: Abdominal Aortogram w/Lower Extremity;  Surgeon: Angelia Mould, MD;  Location: [place] [facility];  Service: Cardiovascular;  Laterality: N/A;   PERIPHERAL VASCULAR INTERVENTION Bilateral 08/20/2020   Procedure: PERIPHERAL VASCULAR INTERVENTION;  Surgeon: Angelia Mould, MD;  Location: [place] [facility];  Service: Cardiovascular;  Laterality: Bilateral;  iliac stents   RADIOLOGY WITH  ANESTHESIA N/A 01/04/2019   Procedure: CODE STROKE;  Surgeon: Radiologist, Medication, MD;  Location: [place];  Service: Radiology;  Laterality: N/A;   SQUAMOUS CELL CARCINOMA EXCISION Left 1993   "took my jaw out; got cadavar in there now"   TONSILLECTOMY      Current Medications: Current Meds  Medication Sig   acetaminophen (TYLENOL) 325 MG tablet Take 2 tablets (650 mg total) by mouth every 6 (six) hours as needed for moderate pain. (Patient taking differently: Take 1,000 mg by mouth every 6 (six) hours as needed for moderate pain.)   ammonium lactate (AMLACTIN) 12 % cream Apply topically 2 (two) times daily.   apixaban (ELIQUIS) 2.5 MG TABS tablet Take 1 tablet (2.5 mg total) by mouth 2 (two) times daily.   calcium-vitamin D (OSCAL WITH D) 500-5 MG-MCG tablet Take 2 tablets by mouth 2 (two) times daily.   carboxymethylcellul-glycerin (OPTIVE) 0.5-0.9 % ophthalmic solution Place 1 drop into both eyes daily as needed for dry eyes.   cetirizine (ZYRTEC) 10 MG tablet Take 10 mg by mouth every morning.    dapagliflozin propanediol (FARXIGA) 10 MG TABS tablet Take 1 tablet (10 mg total) by mouth daily.   feeding supplement (BOOST / RESOURCE BREEZE) LIQD Take 1 Container by mouth 2 (two) times daily between meals. (Patient taking differently: Take 237 mLs by mouth 2 (two) times daily between meals. Ensue)   furosemide (LASIX) 40 MG tablet Take 1 tablet (40 mg total) by mouth daily.   halobetasol (ULTRAVATE) 0.05 % cream Apply 1 Application topically 2 (two) times daily.   magnesium oxide (MAG-OX) 400 (240 Mg) MG tablet Take 1 tablet (400 mg total) by mouth daily.   metoprolol succinate (TOPROL-XL) 25 MG 24 hr tablet Take 0.5 tablets (12.5 mg total) by mouth daily.   Omega-3 Fatty Acids (FISH OIL) 1200 MG CAPS Take 1,200 mg by mouth every morning.   Pediatric Multivitamins-Iron (FLINTSTONES PLUS IRON PO) Take 1 tablet by mouth daily.   potassium chloride SA (KLOR-CON M) 20 MEQ tablet Take 2 tablets (40 mEq total) by mouth daily.   pravastatin (PRAVACHOL) 20 MG tablet Take 1 tablet (20 mg total) by mouth every evening.   Probiotic Product (PHILLIPS COLON HEALTH PO) Take 1 capsule by mouth daily.    silver sulfADIAZINE (SILVADENE) 1 % cream Apply 1 application topically daily. (Patient taking differently: Apply 1 application  topically daily. Between toes)   sodium bicarbonate 650 MG tablet Take 1,300 mg by mouth every morning.     Allergies:   Lipitor [atorvastatin], Simvastatin, Penicillins, and Sulfa antibiotics   Social History   Socioeconomic History   Marital status: Married    Spouse name: Not on file   Number of children: Not on file   Years of education: Not on file   Highest education level: Not on file  Occupational History   Not on file  Tobacco Use   Smoking status: Never    Passive exposure: Never   Smokeless tobacco: Never  Vaping Use   Vaping Use: Never used  Substance and Sexual Activity   Alcohol use: No    Alcohol/week: 0.0 standard drinks of alcohol   Drug use: No    Sexual activity: Yes  Other Topics Concern   Not on file  Social History Narrative   Not on file   Social Determinants of Health   Financial Resource Strain: Not on file  Food Insecurity: No Food Insecurity (08/11/2022)   Hunger Vital Sign  Worried About Charity fundraiser in the Last Year: Never true    [place] in the Last Year: Never true  Transportation Needs: No Transportation Needs (08/11/2022)   PRAPARE - Hydrologist (Medical): No    Lack of Transportation (Non-Medical): No  Physical Activity: Not on file  Stress: Not on file  Social Connections: Not on file     Family History: The patient's family history includes Heart disease in his mother.  ROS:   Please see the history of present illness.     All other systems reviewed and are negative.  EKGs/Labs/Other Studies Reviewed:    The following studies were reviewed today:  Echo 07/2022 1. Left ventricular ejection fraction, by estimation, is 20 to 25%. The  left ventricle has severely decreased function. The left ventricle  demonstrates global hypokinesis. The left ventricular internal cavity size  was mildly dilated. Elevated left  atrial pressure.   2. Right ventricular systolic function is normal. The right ventricular  size is normal.   3. The aortic valve is mildly sclerosed. Aortic valve regurgitation is  mild. No aortic stenosis is present.   4. There is mild dilatation of the aortic root, measuring 42 mm.   5. The mitral valve is grossly normal. Mild mitral valve regurgitation.  No evidence of mitral stenosis.   6. The inferior vena cava is dilated in size with <50% respiratory  variability, suggesting right atrial pressure of 15 mmHg.   Echo 12/2018 1. The left ventricle has a visually estimated ejection fraction of 55%.  The cavity size was normal. Left ventricular diastolic function could not  be evaluated secondary to atrial fibrillation.   2. The mitral  valve is abnormal. Mild thickening of the mitral valve  leaflet.   3. The tricuspid valve is grossly normal.   4. The aortic valve is grossly normal. Mild thickening of the aortic  valve. Mild calcification of the aortic valve. Moderate annular  calcification. Aortic valve regurgitation is mild by color flow Doppler.   5. The aortic root is normal in size and structure.   6. Left atrial size was severely dilated.   7. Right atrial size was moderately dilated.   8. The inferior vena cava was dilated in size with <50% respiratory  variability.   9. Pulmonary hypertension is mild.    EKG:  EKG is not ordered today.    Recent Labs: 08/10/2022: B Natriuretic Peptide 2,530.0 08/11/2022: ALT 28 08/12/2022: Platelets 292 08/14/2022: Magnesium 1.5 10/31/2022: BUN 62; Creatinine, Ser 1.90; Hemoglobin 11.2; Potassium 4.8; Sodium 140  Recent Lipid Panel    Component Value Date/Time   CHOL 90 01/05/2019 0611   TRIG 116 01/07/2019 2022   HDL 30 (L) 01/05/2019 0611   CHOLHDL 3.0 01/05/2019 0611   VLDL 16 01/05/2019 0611   LDLCALC 44 01/05/2019 0611    Physical Exam:    VS:  BP 136/62   Pulse 67   Ht 5' 8.5" (1.74 m)   Wt 143 lb (64.9 kg)   SpO2 99%   BMI 21.43 kg/m     Wt Readings from Last 3 Encounters:  12/06/22 143 lb (64.9 kg)  10/31/22 141 lb (64 kg)  10/26/22 150 lb (68 kg)     GEN:  Well nourished, well developed in no acute distress HEENT: Normal NECK: No JVD; No carotid bruits LYMPHATICS: No lymphadenopathy CARDIAC: Irreg Irreg, no murmurs, rubs, gallops RESPIRATORY:  Clear to auscultation without rales,  wheezing or rhonchi  ABDOMEN: Soft, non-tender, non-distended MUSCULOSKELETAL:  No edema; No deformity  SKIN: Warm and dry NEUROLOGIC:  Alert and oriented x 3 PSYCHIATRIC:  Normal affect   ASSESSMENT:    1. HFrEF (heart failure with reduced ejection fraction) ([place])   2. Coronary artery disease involving native coronary artery of native heart without angina  pectoris   3. Essential hypertension   4. Hyperlipidemia, mixed   5. Permanent atrial fibrillation (HCC)   6. Stage 3b chronic kidney disease ([place])    PLAN:    In order of problems listed above:  HFrEF NSVT Echo during admission in 07/2022 showed newly reduced LVEF 20-25%. No ischemic work-up was pursued at the time due to kidney function. The patient was referred to EP to discuss ICD placement, but was not deemed a good candidate given age and comorbidities. Continue Farxiga, Toprol and lasix. I will order a repeat limited echo to assess the pump function.   CAD s/p remote CABG The patient denies chest pain or shortness of breath. Given reduced EF 20-25%, ischemic work-up was re-visited today. Cath was not previously performed due to renal function. The patient is unsure if he wants to pursue invasive testing in the future. We will continue medical management for now. Continue Toprol and Pravastatin. No ASA given Eliquis. Given vascular cath in 10/2022, may be able to consider cardiac cath vs CTA in the future. Will defer to MD.   HTN BP is good today, continue Toprol.  HLD LDL 31. Continue Pravastatin.   Permanent Afib Patient is rate controlled with Toprol 12.22m daily. Continue stroke ppx with Eliquis 2.520mBID.   CKD stage 3 Most recent lab work showed Scr 1.65, BUN 8 and GFR 41. I will refer patient to establish with nephrology.   Disposition: Follow up in 3 month(s) with MD   Signed, Lincoln Kleiner H Ninfa MeekerPA-C  12/06/2022 3:50 PM    [facility]

## 2022-12-06 NOTE — Patient Instructions (Addendum)
Medication Instructions:  Your physician recommends that you continue on your current medications as directed. Please refer to the Current Medication list given to you today.   Labwork: None  Testing/Procedures: None  Follow-Up: Follow up with Dr. Dellia Cloud in 3 months.   Any Other Special Instructions Will Be Listed Below (If Applicable).     If you need a refill on your cardiac medications before your next appointment, please call your pharmacy.

## 2022-12-07 ENCOUNTER — Telehealth: Payer: Self-pay | Admitting: Cardiology

## 2022-12-07 NOTE — Telephone Encounter (Signed)
Patient states he was returning call. Please advise ?

## 2022-12-07 NOTE — Telephone Encounter (Signed)
Patient states he received a call last night from Dr. Marsa Aris office, he was not sure what the call was in regards to. No documentation of call in chart.  Patient seen in office by Cadence Kathlen Mody, PA-C yesterday 12/06/22.  Will forward to Christella Scheuermann, [place] who worked with [place], PA-C yesterday to follow-up.

## 2022-12-07 NOTE — Telephone Encounter (Signed)
Spoke with pt and reminded the the scheduler will be calling to schedule his Echo.

## 2023-01-12 ENCOUNTER — Ambulatory Visit (HOSPITAL_COMMUNITY)
Admission: RE | Admit: 2023-01-12 | Discharge: 2023-01-12 | Disposition: A | Discharge status: Home / Self Care | Payer: Medicare Other | Source: Ambulatory Visit | Attending: Medical | Admitting: Medical

## 2023-01-12 DIAGNOSIS — I5020 Unspecified systolic (congestive) heart failure: Secondary | ICD-10-CM

## 2023-01-12 LAB — ECHOCARDIOGRAM LIMITED
Calc EF: 28.1 %
S' Lateral: 4.6 cm
Single Plane A2C EF: 32 %
Single Plane A4C EF: 31 %

## 2023-01-12 MED ORDER — PERFLUTREN LIPID MICROSPHERE
1.0000 mL | INTRAVENOUS | Status: AC | PRN
  Administered 2023-01-12: 4 mL via INTRAVENOUS

## 2023-01-12 NOTE — Progress Notes (Addendum)
*  PRELIMINARY RESULTS* Echocardiogram Limited 2-D Echocardiogram  has been performed with [facility]. Patient c/o some lower back pain which lasted approximately 20 mins. Patient pain free at time of discharge.  Samuel Germany 01/12/2023, 4:09 PM

## 2023-01-16 ENCOUNTER — Telehealth: Payer: Self-pay

## 2023-01-16 DIAGNOSIS — Z7189 Other specified counseling: Secondary | ICD-10-CM

## 2023-01-16 DIAGNOSIS — I5020 Unspecified systolic (congestive) heart failure: Secondary | ICD-10-CM

## 2023-01-16 NOTE — Telephone Encounter (Signed)
Patient notified and verbalized understanding. PCP copied. 

## 2023-01-16 NOTE — Telephone Encounter (Signed)
-----   Message from Meryl Crutch, RN sent at 01/15/2023  4:56 PM EDT -----  ----- Message ----- From: Antony Madura, PA-C Sent: 01/15/2023  10:02 AM EDT To: Meryl Crutch, RN  Echo showed persistently reduced pump function 20-25%. We need to refer to EP to discuss ICD

## 2023-02-01 ENCOUNTER — Encounter: Payer: Self-pay | Admitting: Vascular Surgery

## 2023-02-01 ENCOUNTER — Ambulatory Visit (INDEPENDENT_AMBULATORY_CARE_PROVIDER_SITE_OTHER): Payer: Medicare Other | Admitting: Vascular Surgery

## 2023-02-01 VITALS — BP 150/72 | HR 62 | Temp 98.5°F | Resp 20 | Ht 68.5 in | Wt 143.0 lb

## 2023-02-01 DIAGNOSIS — L97909 Non-pressure chronic ulcer of unspecified part of unspecified lower leg with unspecified severity: Secondary | ICD-10-CM

## 2023-02-01 DIAGNOSIS — I70299 Other atherosclerosis of native arteries of extremities, unspecified extremity: Secondary | ICD-10-CM

## 2023-02-01 NOTE — Progress Notes (Signed)
REASON FOR VISIT:   Follow-up of peripheral arterial disease with ulceration right great toe  MEDICAL ISSUES:   PERIPHERAL ARTERIAL DISEASE: The wound on the right great toe has now completely healed.  His arteriogram showed patent two-vessel runoff on the right with the dominant runoff being the posterior tibial artery.  He has a brisk biphasic posterior tibial signal with the Doppler.  He is not a smoker.  I encouraged him to stay as active as possible.  I have informed him that I will be retiring.  Will bring him back to the office on the PA schedule in 1 year with follow-up ABIs.  He is to call sooner if he has problems.  HPI:   [name] is a pleasant 87 y.o. male who I saw on 10/26/2022 with an ulceration on his right foot.  He was set up for an arteriogram.   The patient underwent an arteriogram on 10/31/2022.  This shows no evidence of inflow disease.  On the right side, which was the site of concern, the right common femoral and deep femoral arteries were patent.  The superficial femoral artery had a less than 30% stenosis at its origin.  The remainder of the artery was widely patent.  There was two-vessel runoff via the posterior tibial and peroneal arteries.  Dr. Myra GianottiBrabham did a pullback pressure from the mid superficial femoral artery up to the aorta and there was no pressure gradient noted.  The posterior tibial artery crossed the ankle.  Thus it was felt that he had no significant hemodynamically significant stenoses in the right leg that could be addressed.  Since his arteriogram he has been taking excellent care of his toe and the toe has now completely healed.  He has an AKA on the left.  He denies any rest pain in the right foot.  Past Medical History:  Diagnosis Date   Arteriosclerotic cardiovascular disease (ASCVD)    CABG in 1990s; 2002 total obstruction of LAD, CX and RCA with patent grafts and nl EF; Stress nuc. 2008 - mild LV dilation; normal EF; questionable small  anteroapical scar; no ischemia   Arthritis    "left knee" (11/08/2015)   Atrial fibrillation    Cellulitis 11/08/2015   "both feet"   Chronic anticoagulation    Dental crowns present    Diabetic peripheral neuropathy    bilateral lower leg, hands   History of blood transfusion    "related to my leg surgery?"   Hypertension    states under control with meds., has been on med. x "long time"   Iron deficiency anemia    Jaw cancer 1990s   "squamous cell"   Myocardial infarction 1990s   "after my jaw OR"   Pedal edema    "not a lot", per pt.   Peripheral vascular disease    Presence of retained hardware 07/2015   failed hardware jaw   Runny nose 07/27/2015   clear drainage, per pt.   Stroke    Type II diabetes mellitus    "diet controlled" (11/08/2015)    Family History  Problem Relation Age of Onset   Heart disease Mother     SOCIAL HISTORY: Social History   Tobacco Use   Smoking status: Never    Passive exposure: Never   Smokeless tobacco: Never  Substance Use Topics   Alcohol use: No    Alcohol/week: 0.0 standard drinks of alcohol    Allergies  Allergen Reactions   Lipitor [Atorvastatin]  Other (See Comments)    SEVERE HEADACHE   Simvastatin Other (See Comments)    MUSCLE ACHES   Penicillins Rash    Has patient had a PCN reaction causing immediate rash, facial/tongue/throat swelling, SOB or lightheadedness with hypotension: Yes Has patient had a PCN reaction causing severe rash involving mucus membranes or skin necrosis: No Has patient had a PCN reaction that required hospitalization No Has patient had a PCN reaction occurring within the last 10 years: No If all of the above answers are "NO", then may proceed with Cephalosporin use.    Sulfa Antibiotics Rash    Tolerates silver sulfadiazine cream at home    Current Outpatient Medications  Medication Sig Dispense Refill   acetaminophen (TYLENOL) 325 MG tablet Take 2 tablets (650 mg total) by mouth every 6  (six) hours as needed for moderate pain. (Patient taking differently: Take 1,000 mg by mouth every 6 (six) hours as needed for moderate pain.) 30 tablet 0   ammonium lactate (AMLACTIN) 12 % cream Apply topically 2 (two) times daily.     apixaban (ELIQUIS) 2.5 MG TABS tablet Take 1 tablet (2.5 mg total) by mouth 2 (two) times daily. 60 tablet 1   augmented betamethasone dipropionate (DIPROLENE-AF) 0.05 % cream Apply topically.     calcium-vitamin D (OSCAL WITH D) 500-5 MG-MCG tablet Take 2 tablets by mouth 2 (two) times daily. 60 tablet 2   carboxymethylcellul-glycerin (OPTIVE) 0.5-0.9 % ophthalmic solution Place 1 drop into both eyes daily as needed for dry eyes.     cetirizine (ZYRTEC) 10 MG tablet Take 10 mg by mouth every morning.     cholecalciferol (VITAMIN D3) 25 MCG (1000 UNIT) tablet Take 2,000 Units by mouth daily.     dapagliflozin propanediol (FARXIGA) 10 MG TABS tablet Take 1 tablet (10 mg total) by mouth daily. 90 tablet 3   diphenoxylate-atropine (LOMOTIL) 2.5-0.025 MG tablet Take 1 tablet by mouth 4 (four) times daily as needed for diarrhea or loose stools. 30 tablet 1   feeding supplement (BOOST / RESOURCE BREEZE) LIQD Take 1 Container by mouth 2 (two) times daily between meals. (Patient taking differently: Take 237 mLs by mouth 2 (two) times daily between meals. Ensue)     furosemide (LASIX) 40 MG tablet Take 1 tablet (40 mg total) by mouth daily. 30 tablet 3   halobetasol (ULTRAVATE) 0.05 % cream Apply 1 Application topically 2 (two) times daily.     magnesium oxide (MAG-OX) 400 (240 Mg) MG tablet Take 1 tablet (400 mg total) by mouth daily. 30 tablet 3   metoprolol succinate (TOPROL-XL) 25 MG 24 hr tablet Take 0.5 tablets (12.5 mg total) by mouth daily. 90 tablet 3   Omega-3 Fatty Acids (FISH OIL) 1200 MG CAPS Take 1,200 mg by mouth every morning.     Pediatric Multivitamins-Iron (FLINTSTONES PLUS IRON PO) Take 1 tablet by mouth daily.     potassium chloride SA (KLOR-CON M) 20 MEQ  tablet Take 2 tablets (40 mEq total) by mouth daily. 60 tablet 3   pravastatin (PRAVACHOL) 20 MG tablet Take 1 tablet (20 mg total) by mouth every evening. 30 tablet 3   Probiotic Product (PHILLIPS COLON HEALTH PO) Take 1 capsule by mouth daily.      silver sulfADIAZINE (SILVADENE) 1 % cream Apply 1 application topically daily. (Patient taking differently: Apply 1 application  topically daily. Between toes)     sodium bicarbonate 650 MG tablet Take 1,300 mg by mouth 2 (two) times daily.  No current facility-administered medications for this visit.    REVIEW OF SYSTEMS:  [X]  denotes positive finding, [ ]  denotes negative finding Cardiac  Comments:  Chest pain or chest pressure:    Shortness of breath upon exertion:    Short of breath when lying flat:    Irregular heart rhythm:        Vascular    Pain in calf, thigh, or hip brought on by ambulation:    Pain in feet at night that wakes you up from your sleep:     Blood clot in your veins:    Leg swelling:         Pulmonary    Oxygen at home:    Productive cough:     Wheezing:         Neurologic    Sudden weakness in arms or legs:     Sudden numbness in arms or legs:     Sudden onset of difficulty speaking or slurred speech:    Temporary loss of vision in one eye:     Problems with dizziness:         Gastrointestinal    Blood in stool:     Vomited blood:         Genitourinary    Burning when urinating:     Blood in urine:        Psychiatric    Major depression:         Hematologic    Bleeding problems:    Problems with blood clotting too easily:        Skin    Rashes or ulcers:        Constitutional    Fever or chills:     PHYSICAL EXAM:   Vitals:   02/01/23 1419  BP: (!) 150/72  Pulse: 62  Resp: 20  Temp: 98.5 F (36.9 C)  SpO2: 96%  Weight: 143 lb (64.9 kg)  Height: 5' 8.5" (1.74 m)    GENERAL: The patient is a well-nourished male, in no acute distress. The vital signs are documented  above. CARDIAC: There is a regular rate and rhythm.  VASCULAR: I do not detect carotid bruits. He has a palpable femoral and popliteal pulse on the right.  I cannot palpate pedal pulses.  He has a biphasic posterior tibial signal with the Doppler and a monophasic peroneal signal. PULMONARY: There is good air exchange bilaterally without wheezing or rales. MUSCULOSKELETAL: He has an AKA on the left. NEUROLOGIC: No focal weakness or paresthesias are detected. SKIN: There are no ulcers or rashes noted. PSYCHIATRIC: The patient has a normal affect.  DATA:    No new data  Waverly [facility] [phone]

## 2023-02-28 ENCOUNTER — Ambulatory Visit: Payer: Medicare Other | Attending: Internal Medicine | Admitting: Internal Medicine

## 2023-02-28 ENCOUNTER — Encounter: Payer: Self-pay | Admitting: Internal Medicine

## 2023-02-28 VITALS — BP 146/88 | HR 84 | Ht 68.5 in | Wt 143.0 lb

## 2023-02-28 DIAGNOSIS — I255 Ischemic cardiomyopathy: Secondary | ICD-10-CM | POA: Insufficient documentation

## 2023-02-28 NOTE — Progress Notes (Signed)
HPI [name] is referred by Cadence Furth for ongoing evaluation and management of an ICM. He is a pleasant 87 yo man who sustained an anterior MI over 25 years ago. He is s/p CABG and has persistent atrial fib. He has peripheral vascular disease and is s/p left AKA. He has never had syncope.  Allergies  Allergen Reactions   Lipitor [Atorvastatin] Other (See Comments)    SEVERE HEADACHE   Simvastatin Other (See Comments)    MUSCLE ACHES   Penicillins Rash    Has patient had a PCN reaction causing immediate rash, facial/tongue/throat swelling, SOB or lightheadedness with hypotension: Yes Has patient had a PCN reaction causing severe rash involving mucus membranes or skin necrosis: No Has patient had a PCN reaction that required hospitalization No Has patient had a PCN reaction occurring within the last 10 years: No If all of the above answers are "NO", then may proceed with Cephalosporin use.    Sulfa Antibiotics Rash    Tolerates silver sulfadiazine cream at home     Current Outpatient Medications  Medication Sig Dispense Refill   acetaminophen (TYLENOL) 325 MG tablet Take 2 tablets (650 mg total) by mouth every 6 (six) hours as needed for moderate pain. (Patient taking differently: Take 1,000 mg by mouth every 6 (six) hours as needed for moderate pain.) 30 tablet 0   ammonium lactate (AMLACTIN) 12 % cream Apply topically 2 (two) times daily.     apixaban (ELIQUIS) 2.5 MG TABS tablet Take 1 tablet (2.5 mg total) by mouth 2 (two) times daily. 60 tablet 1   augmented betamethasone dipropionate (DIPROLENE-AF) 0.05 % cream Apply topically.     calcium-vitamin D (OSCAL WITH D) 500-5 MG-MCG tablet Take 2 tablets by mouth 2 (two) times daily. 60 tablet 2   carboxymethylcellul-glycerin (OPTIVE) 0.5-0.9 % ophthalmic solution Place 1 drop into both eyes daily as needed for dry eyes.     cetirizine (ZYRTEC) 10 MG tablet Take 10 mg by mouth every morning.     cholecalciferol (VITAMIN D3)  25 MCG (1000 UNIT) tablet Take 2,000 Units by mouth daily.     dapagliflozin propanediol (FARXIGA) 10 MG TABS tablet Take 1 tablet (10 mg total) by mouth daily. 90 tablet 3   feeding supplement (BOOST / RESOURCE BREEZE) LIQD Take 1 Container by mouth 2 (two) times daily between meals. (Patient taking differently: Take 237 mLs by mouth 2 (two) times daily between meals. Ensue)     furosemide (LASIX) 40 MG tablet Take 1 tablet (40 mg total) by mouth daily. 30 tablet 3   halobetasol (ULTRAVATE) 0.05 % cream Apply 1 Application topically 2 (two) times daily.     magnesium oxide (MAG-OX) 400 (240 Mg) MG tablet Take 1 tablet (400 mg total) by mouth daily. 30 tablet 3   metoprolol succinate (TOPROL-XL) 25 MG 24 hr tablet Take 0.5 tablets (12.5 mg total) by mouth daily. 90 tablet 3   Omega-3 Fatty Acids (FISH OIL) 1200 MG CAPS Take 1,200 mg by mouth every morning.     Pediatric Multivitamins-Iron (FLINTSTONES PLUS IRON PO) Take 1 tablet by mouth daily.     potassium chloride SA (KLOR-CON M) 20 MEQ tablet Take 2 tablets (40 mEq total) by mouth daily. 60 tablet 3   pravastatin (PRAVACHOL) 20 MG tablet Take 1 tablet (20 mg total) by mouth every evening. 30 tablet 3   Probiotic Product (PHILLIPS COLON HEALTH PO) Take 1 capsule by mouth daily.  silver sulfADIAZINE (SILVADENE) 1 % cream Apply 1 application topically daily. (Patient taking differently: Apply 1 application  topically daily. Between toes)     sodium bicarbonate 650 MG tablet Take 1,300 mg by mouth 2 (two) times daily.     diphenoxylate-atropine (LOMOTIL) 2.5-0.025 MG tablet Take 1 tablet by mouth 4 (four) times daily as needed for diarrhea or loose stools. (Patient not taking: Reported on 02/28/2023) 30 tablet 1   No current facility-administered medications for this visit.     Past Medical History:  Diagnosis Date   Arteriosclerotic cardiovascular disease (ASCVD)    CABG in 1990s; 2002 total obstruction of LAD, CX and RCA with patent grafts  and nl EF; Stress nuc. 2008 - mild LV dilation; normal EF; questionable small anteroapical scar; no ischemia   Arthritis    "left knee" (11/08/2015)   Atrial fibrillation (HCC)    Cellulitis 11/08/2015   "both feet"   Chronic anticoagulation    Dental crowns present    Diabetic peripheral neuropathy (HCC)    bilateral lower leg, hands   History of blood transfusion    "related to my leg surgery?"   Hypertension    states under control with meds., has been on med. x "long time"   Iron deficiency anemia    Jaw cancer (HCC) 1990s   "squamous cell"   Myocardial infarction (HCC) 1990s   "after my jaw OR"   Pedal edema    "not a lot", per pt.   Peripheral vascular disease (HCC)    Presence of retained hardware 07/2015   failed hardware jaw   Runny nose 07/27/2015   clear drainage, per pt.   Stroke [facility])    Type II diabetes mellitus (HCC)    "diet controlled" (11/08/2015)    ROS:   All systems reviewed and negative except as noted in the HPI.   Past Surgical History:  Procedure Laterality Date   ABDOMINAL AORTOGRAM W/LOWER EXTREMITY Bilateral 08/20/2020   Procedure: ABDOMINAL AORTOGRAM W/LOWER EXTREMITY;  Surgeon: Chuck Hint, MD;  Location: [facility] INVASIVE CV LAB;  Service: Cardiovascular;  Laterality: Bilateral;   ABDOMINAL AORTOGRAM W/LOWER EXTREMITY Right 10/31/2022   Procedure: ABDOMINAL AORTOGRAM W/LOWER EXTREMITY;  Surgeon: Nada Libman, MD;  Location: [facility] INVASIVE CV LAB;  Service: Cardiovascular;  Laterality: Right;   AMPUTATION Left 11/11/2015   Procedure: LEFT ABOVE KNEE AMPUTATION;  Surgeon: Chuck Hint, MD;  Location: [facility] OR;  Service: Vascular;  Laterality: Left;   CARDIAC CATHETERIZATION  1990s; 04/16/2001   CATARACT EXTRACTION W/ INTRAOCULAR LENS  IMPLANT, BILATERAL Bilateral 09/2015   COLONOSCOPY N/A 11/27/2014   Procedure: COLONOSCOPY;  Surgeon: Malissa Hippo, MD;  Location: [facility] ENDO SUITE;  Service: Endoscopy;  Laterality: N/A;  830   CORONARY  ARTERY BYPASS GRAFT  1990's   "CABG X4"   ESOPHAGOGASTRODUODENOSCOPY N/A 02/14/2018   Procedure: ESOPHAGOGASTRODUODENOSCOPY (EGD);  Surgeon: Malissa Hippo, MD;  Location: [facility];  Service: Endoscopy;  Laterality: N/A;   ESOPHAGOGASTRODUODENOSCOPY (EGD) WITH PROPOFOL N/A 02/25/2018   Procedure: ESOPHAGOGASTRODUODENOSCOPY (EGD) WITH PROPOFOL;  Surgeon: Malissa Hippo, MD;  Location: [facility];  Service: Endoscopy;  Laterality: N/A;   FRACTURE SURGERY     GIVENS CAPSULE STUDY N/A 02/15/2018   Procedure: GIVENS CAPSULE STUDY;  Surgeon: Malissa Hippo, MD;  Location: [facility];  Service: Endoscopy;  Laterality: N/A;   GIVENS CAPSULE STUDY N/A 03/27/2018   Procedure: GIVENS CAPSULE STUDY;  Surgeon: Malissa Hippo, MD;  Location: [facility] END[facility];  Service: Endoscopy;  Laterality: N/A;   I & D EXTREMITY Left 11/10/2015   Procedure: INCISION AND DRAINAGE LEFT FOOT, AMPUTATION OF LEFT THIRD TOE;  Surgeon: Fransisco Hertz, MD;  Location: [facility] OR;  Service: Vascular;  Laterality: Left;   INCISION AND DRAINAGE ABSCESS Left 06/16/2005   wide exc. abscess 5th toe   IR ANGIOGRAM VISCERAL SELECTIVE  03/28/2018   IR ANGIOGRAM VISCERAL SELECTIVE  03/28/2018   IR CT HEAD LTD  01/04/2019   IR PERCUTANEOUS ART THROMBECTOMY/INFUSION INTRACRANIAL INC DIAG ANGIO  01/04/2019   IR US GUIDE VASC ACCESS LEFT  03/28/2018   MANDIBULAR HARDWARE REMOVAL Left 08/02/2015   Procedure:  HARDWARE REMOVAL TWO MANDIBULAR SCREWS;  Surgeon: Felton Clinton, DDS;  Location:  SURGERY CENTER;  Service: Oral Surgery;  Laterality: Left;   ORIF TIBIA FRACTURE Left ~ 1970   PERIPHERAL VASCULAR CATHETERIZATION N/A 08/14/2016   Procedure: Abdominal Aortogram w/Lower Extremity;  Surgeon: Chuck Hint, MD;  Location: [facility] INVASIVE CV LAB;  Service: Cardiovascular;  Laterality: N/A;   PERIPHERAL VASCULAR INTERVENTION Bilateral 08/20/2020   Procedure: PERIPHERAL VASCULAR INTERVENTION;  Surgeon: Chuck Hint, MD;   Location: [facility] INVASIVE CV LAB;  Service: Cardiovascular;  Laterality: Bilateral;  iliac stents   RADIOLOGY WITH ANESTHESIA N/A 01/04/2019   Procedure: CODE STROKE;  Surgeon: Radiologist, Medication, MD;  Location: [facility] OR;  Service: Radiology;  Laterality: N/A;   SQUAMOUS CELL CARCINOMA EXCISION Left 1993   "took my jaw out; got cadavar in there now"   TONSILLECTOMY       Family History  Problem Relation Age of Onset   Heart disease Mother      Social History   Socioeconomic History   Marital status: Married    Spouse name: Not on file   Number of children: Not on file   Years of education: Not on file   Highest education level: Not on file  Occupational History   Not on file  Tobacco Use   Smoking status: Never    Passive exposure: Never   Smokeless tobacco: Never  Vaping Use   Vaping Use: Never used  Substance and Sexual Activity   Alcohol use: No    Alcohol/week: 0.0 standard drinks of alcohol   Drug use: No   Sexual activity: Yes  Other Topics Concern   Not on file  Social History Narrative   Not on file   Social Determinants of Health   Financial Resource Strain: Not on file  Food Insecurity: No Food Insecurity (08/11/2022)   Hunger Vital Sign    Worried About Running Out of Food in the Last Year: Never true    Ran Out of Food in the Last Year: Never true  Transportation Needs: No Transportation Needs (08/11/2022)   PRAPARE - Administrator, Civil Service (Medical): No    Lack of Transportation (Non-Medical): No  Physical Activity: Not on file  Stress: Not on file  Social Connections: Not on file  Intimate Partner Violence: Not At Risk (08/11/2022)   Humiliation, Afraid, Rape, and Kick questionnaire    Fear of Current or Ex-Partner: No    Emotionally Abused: No    Physically Abused: No    Sexually Abused: No     BP (!) 146/88   Pulse 84   Ht 5' 8.5" (1.74 m)   Wt 143 lb (64.9 kg)   SpO2 95%   BMI 21.43 kg/m   Physical  Exam:  Elderly appearing NAD HEENT: s/p  left jaw removal Neck:  No JVD, no thyromegally Lymphatics:  No adenopathy Back:  No CVA tenderness Lungs:  Clear with no wheezes HEART:  Regular rate rhythm, no murmurs, no rubs, no clicks Abd:  soft, positive bowel sounds, no organomegally, no rebound, no guarding Ext:  2 plus pulses, no edema, no cyanosis, no clubbing Skin:  No rashes no nodules Neuro:  CN II through XII intact, motor grossly intact  Assess/Plan: ICM - he has an EF of 25%. He is on GDMT. His advanced age and multiple comorbidities make him a poor candidate for primary prevention ICD insertion. He wll undergo watchful waiting.   [name] Gowda Hersel Mcmeen,MD

## 2023-02-28 NOTE — Patient Instructions (Signed)
Medication Instructions:  Your physician recommends that you continue on your current medications as directed. Please refer to the Current Medication list given to you today.  *If you need a refill on your cardiac medications before your next appointment, please call your pharmacy*   Lab Work: NONE   If you have labs (blood work) drawn today and your tests are completely normal, you will receive your results only by: MyChart Message (if you have MyChart) OR A paper copy in the mail If you have any lab test that is abnormal or we need to change your treatment, we will call you to review the results.   Testing/Procedures: NONE    Follow-Up: At [facility], you and your health needs are our priority.  As part of our continuing mission to provide you with exceptional heart care, we have created designated Provider Care Teams.  These Care Teams include your primary Cardiologist (physician) and Advanced Practice Providers (APPs -  Physician Assistants and Nurse Practitioners) who all work together to provide you with the care you need, when you need it.  We recommend signing up for the patient portal called "MyChart".  Sign up information is provided on this After Visit Summary.  MyChart is used to connect with patients for Virtual Visits (Telemedicine).  Patients are able to view lab/test results, encounter notes, upcoming appointments, etc.  Non-urgent messages can be sent to your provider as well.   To learn more about what you can do with MyChart, go to [email or web address].    Your next appointment:    As Needed   Provider:   Gregg Taylor, MD    Other Instructions Thank you for choosing [facility]!    

## 2023-03-06 ENCOUNTER — Ambulatory Visit: Payer: Medicare Other | Admitting: Student

## 2023-03-06 NOTE — Progress Notes (Deleted)
   Cardiology Office Note    Date:  03/06/2023  ID:  [name], DOB 17-Jan-1936, MRN [id number] Cardiologist: Donato Schultz, MD   EP: Dr. Ladona Ridgel  History of Present Illness:    [name] is a 87 y.o. male with past history of CAD (s/p CABG in 1990's, patent bypass grafts by catheterization in 2002, low-risk NST in 2008), HFrEF (EF 20-25% by echo in 07/2023), permanent atrial fibrillation, HTN, HLD, history of GI bleed, PAD (s/p L AKA), Stage 3 CKD and prior CVA who presents to the office today for 79-month follow-up.  He was last examined by Cadence Furth, PA in 11/2022 and denied any recent anginal symptoms at that time.  He was continued on Farxiga, Toprol and Lasix with plans to obtain a limited echocardiogram for reassessment of his EF.  This showed his EF remained reduced at 20 to 25% and he was again referred back to EP for possible discussion of ICD placement.  He did meet with Dr. Ladona Ridgel again earlier this month and the possibility of an ICD was reviewed but was not felt to be a good candidate again given his advanced age and comorbidities.  ROS: ***  Studies Reviewed:   EKG: EKG is*** ordered today and demonstrates ***  Limited Echocardiogram: 12/2022 IMPRESSIONS     1. The distal apex is dyskinetic,mildly aneurysmal.. Left ventricular  ejection fraction, by estimation, is 20 to 25%. The left ventricle has  severely decreased function. The left ventricle demonstrates global  hypokinesis.   2. Right ventricular systolic function is normal. The right ventricular  size is normal.   3. Limited echo assess LV function   Risk Assessment/Calculations:   {Does this patient have ATRIAL FIBRILLATION?:[id number]} No BP recorded.  {Refresh Note OR Click here to enter BP  :1}***         Physical Exam:   VS:  There were no vitals taken for this visit.   Wt Readings from Last 3 Encounters:  02/28/23 143 lb (64.9 kg)  02/01/23 143 lb (64.9 kg)  12/06/22 143 lb (64.9 kg)      GEN: Well nourished, well developed in no acute distress NECK: No JVD; No carotid bruits CARDIAC: ***RRR, no murmurs, rubs, gallops RESPIRATORY:  Clear to auscultation without rales, wheezing or rhonchi  ABDOMEN: Appears non-distended. No obvious abdominal masses. EXTREMITIES: No clubbing or cyanosis. No edema.  Distal pedal pulses are 2+ bilaterally.   Assessment and Plan:   1. Chronic HFrEF - ***  - Not on ACE-I/ARB/ARNI/MRA given his renal function.   2. NSVT - He previously had a LifeVest in place following NSVT during admission in 07/2022.  He did meet with Dr. Ladona Ridgel in 08/2022 and again earlier this month and was felt to be a very poor candidate for ICD for primary prevention given his advanced age and comorbidities. Remains on Toprol-XL 12.5 mg daily.  3. CAD - He had prior CABG in the 1990's with patent grafts by catheterization in 2002. ***  4. HLD - ****  5. Permanent Atrial Fibrillation - **  6.  Stage 3 CKD - Baseline creatinine 1.8 - 1.9. At 1.90 in 10/2022. Previously had an AKI in 06/2022 with creatinine peaking at 4.43.    Signed, Ellsworth Lennox, PA-C

## 2023-04-05 LAB — LAB REPORT - SCANNED
A1c: 5.9
EGFR: 35

## 2023-05-10 ENCOUNTER — Telehealth: Payer: Self-pay

## 2023-05-10 DIAGNOSIS — I739 Peripheral vascular disease, unspecified: Secondary | ICD-10-CM

## 2023-05-10 NOTE — Telephone Encounter (Signed)
Caller: Patient's wife, Claris Che  Concern: Non-healing wound on the back of leg the size of a quarter or slightly larger that he noticed approx a couple of months ago. It bleeds sometimes, especially when getting out of bed. There is also a blood blister on his great toe.   He denies any injury to the area, no coldness, no rest pain. He has been putting betamethasone and antibacterial creams on it.   Location: right leg  Description:  x 2+ months  Resolution: Appointment scheduled for a non-urgent triage appt on Dr. Adele Dan clinic day  Next Appt: Appointment scheduled for 05/31/23 for ABI and PA.

## 2023-05-18 ENCOUNTER — Ambulatory Visit (INDEPENDENT_AMBULATORY_CARE_PROVIDER_SITE_OTHER): Payer: Medicare Other | Admitting: Family

## 2023-05-18 ENCOUNTER — Encounter: Payer: Self-pay | Admitting: Family

## 2023-05-18 DIAGNOSIS — L97909 Non-pressure chronic ulcer of unspecified part of unspecified lower leg with unspecified severity: Secondary | ICD-10-CM

## 2023-05-18 NOTE — Addendum Note (Signed)
Addended by: Barnie Del R on: 05/18/2023 11:11 AM   Modules accepted: Orders

## 2023-05-18 NOTE — Progress Notes (Signed)
Office Visit Note   Patient: [name]           Date of Birth: 1936/09/30           MRN: [id number] Visit Date: 05/18/2023              Requested by: Gareth Morgan, MD [id number] [place],  [place] [address] PCP: Gareth Morgan, MD  Chief Complaint  Patient presents with   Right Foot - Wound Check      HPI: The patient is an 87 year old gentleman who presents today for evaluation of an ulcer to his right posterior calf.  He has no concerns of pain or infection with this ulcer has been present for about 3 months now without does have a history of above-knee amputation on the left  Granddaughter who attends the visit reports that he does use a wedge pillow in bed at night to elevate his right lower extremity this does not cause him any discomfort.  He has pressure directly on his posterior calf area from the wedge while he sleeps he has been doing this for years  Assessment & Plan: Visit Diagnoses: No diagnosis found.  Plan: Has an appointment with vascular surgery in 2 weeks.  The patient would like to hold off on any change in his treatment until after that visit  Will send a referral for home health wound care nurse to assist with his care as his wife is providing most of his care and has been having difficulty doing so  Follow-Up Instructions: No follow-ups on file.   Ortho Exam  Patient is alert, oriented, no adenopathy, well-dressed, normal affect, normal respiratory effort.  On examination of the right lower extremity there is mottling.  Hemosiderin staining there is no cellulitis On examination of the right posterior calf the patient has a chronic appearing ulcer which is 4.5 cm x 2.5 cm with 2 mm of depth this is filled in with about 70% fibrinous exudative tissue.  There is no drainage no erythema or odor Foot normothermic  Imaging: No results found. No images are attached to the encounter.  Labs: Lab Results  Component Value Date   HGBA1C 6.3 (H)  01/05/2019   HGBA1C 5.6 08/10/2016   HGBA1C 5.7 (H) 11/08/2015   CRP 19.9 (H) 11/09/2015   LABURIC 100 01/10/2017   REPTSTATUS 01/07/2019 FINAL 01/05/2019   GRAMSTAIN  11/10/2015    MODERATE WBC PRESENT, PREDOMINANTLY PMN NO SQUAMOUS EPITHELIAL CELLS SEEN ABUNDANT GRAM POSITIVE COCCI IN PAIRS Performed at [facility]    GRAMSTAIN  11/10/2015    ABUNDANT WBC PRESENT,BOTH PMN AND MONONUCLEAR NO SQUAMOUS EPITHELIAL CELLS SEEN MODERATE GRAM POSITIVE COCCI IN PAIRS Performed at [facility]    CULT 70,000 COLONIES/mL ESCHERICHIA COLI (A) 01/05/2019   LABORGA ESCHERICHIA COLI (A) 01/05/2019     Lab Results  Component Value Date   ALBUMIN 2.8 (L) 08/13/2022   ALBUMIN 2.8 (L) 08/12/2022   ALBUMIN 2.7 (L) 08/11/2022    Lab Results  Component Value Date   MG 1.5 (L) 08/14/2022   MG 0.9 (LL) 08/11/2022   MG 1.9 06/26/2022   Lab Results  Component Value Date   VD25OH 34.2 11/10/2015    No results found for: "PREALBUMIN"    Latest Ref Rng & Units 10/31/2022   10:01 AM 08/12/2022    6:55 AM 08/11/2022    5:21 AM  CBC EXTENDED  WBC 4.0 - 10.5 K/uL  7.2  5.8   RBC  4.22 - 5.81 MIL/uL  3.02  2.83   Hemoglobin 13.0 - 17.0 g/dL 16.1  9.0  8.5   HCT 09.6 - 52.0 % 33.0  28.5  27.2   Platelets 150 - 400 K/uL  292  268      There is no height or weight on file to calculate BMI.  Orders:  No orders of the defined types were placed in this encounter.  No orders of the defined types were placed in this encounter.    Procedures: No procedures performed  Clinical Data: No additional findings.  ROS:  All other systems negative, except as noted in the HPI. Review of Systems  Objective: Vital Signs: There were no vitals taken for this visit.  Specialty Comments:  No specialty comments available.  PMFS History: Patient Active Problem List   Diagnosis Date Noted   Prolonged QT interval 08/11/2022   Mixed hyperlipidemia 08/11/2022   Congestive heart  failure (CHF) (HCC) 08/10/2022   Hypokalemia 06/23/2022   Hypomagnesemia 06/23/2022   Hypocalcemia 06/23/2022   Hyperphosphatemia 06/23/2022   Pure hypercholesterolemia 08/22/2021   Stroke (HCC) 01/04/2019   Stroke (cerebrum) (HCC) 01/04/2019   Middle cerebral artery embolism, left (HCC) s/p mechanical thrombectomy 01/04/2019   Gastrointestinal hemorrhage    AKI (acute kidney injury) (HCC)    Anemia 03/26/2018   Melena 02/21/2018   Upper GI bleed 02/13/2018   Coagulopathy (HCC) 02/13/2018   Weight gain 11/30/2015   Coronary artery disease involving coronary bypass graft of native heart without angina pectoris    Tachycardia    Leukocytosis    Abnormality of gait    Acute blood loss anemia    Acute pulmonary edema (HCC)    Acute congestive heart failure (HCC)    Acute respiratory failure with hypoxia (HCC)    Pressure ulcer 11/09/2015   Necrotic toes (HCC) 11/08/2015   Lower extremity cellulitis 11/08/2015   Bradycardia 05/28/2015   PVD (peripheral vascular disease) with claudication (HCC) 09/30/2014   Peripheral vascular disease (HCC) 08/06/2012   Chronic anticoagulation 01/20/2011   Diabetes mellitus (HCC) 03/04/2010   Dyslipidemia 03/04/2010   Essential hypertension 03/04/2010   Hx of CABG 03/04/2010   Permanent atrial fibrillation (HCC) 04/08/2009   Past Medical History:  Diagnosis Date   Arteriosclerotic cardiovascular disease (ASCVD)    CABG in 1990s; 2002 total obstruction of LAD, CX and RCA with patent grafts and nl EF; Stress nuc. 2008 - mild LV dilation; normal EF; questionable small anteroapical scar; no ischemia   Arthritis    "left knee" (11/08/2015)   Atrial fibrillation (HCC)    Cellulitis 11/08/2015   "both feet"   Chronic anticoagulation    Dental crowns present    Diabetic peripheral neuropathy (HCC)    bilateral lower leg, hands   History of blood transfusion    "related to my leg surgery?"   Hypertension    states under control with meds., has  been on med. x "long time"   Iron deficiency anemia    Jaw cancer (HCC) 1990s   "squamous cell"   Myocardial infarction (HCC) 1990s   "after my jaw OR"   Pedal edema    "not a lot", per pt.   Peripheral vascular disease (HCC)    Presence of retained hardware 07/2015   failed hardware jaw   Runny nose 07/27/2015   clear drainage, per pt.   Stroke [facility])    Type II diabetes mellitus (HCC)    "diet controlled" (11/08/2015)    Family  History  Problem Relation Age of Onset   Heart disease Mother     Past Surgical History:  Procedure Laterality Date   ABDOMINAL AORTOGRAM W/LOWER EXTREMITY Bilateral 08/20/2020   Procedure: ABDOMINAL AORTOGRAM W/LOWER EXTREMITY;  Surgeon: Chuck Hint, MD;  Location: [facility] INVASIVE CV LAB;  Service: Cardiovascular;  Laterality: Bilateral;   ABDOMINAL AORTOGRAM W/LOWER EXTREMITY Right 10/31/2022   Procedure: ABDOMINAL AORTOGRAM W/LOWER EXTREMITY;  Surgeon: Nada Libman, MD;  Location: [facility] INVASIVE CV LAB;  Service: Cardiovascular;  Laterality: Right;   AMPUTATION Left 11/11/2015   Procedure: LEFT ABOVE KNEE AMPUTATION;  Surgeon: Chuck Hint, MD;  Location: [facility] OR;  Service: Vascular;  Laterality: Left;   CARDIAC CATHETERIZATION  1990s; 04/16/2001   CATARACT EXTRACTION W/ INTRAOCULAR LENS  IMPLANT, BILATERAL Bilateral 09/2015   COLONOSCOPY N/A 11/27/2014   Procedure: COLONOSCOPY;  Surgeon: Malissa Hippo, MD;  Location: [facility] ENDO SUITE;  Service: Endoscopy;  Laterality: N/A;  830   CORONARY ARTERY BYPASS GRAFT  1990's   "CABG X4"   ESOPHAGOGASTRODUODENOSCOPY N/A 02/14/2018   Procedure: ESOPHAGOGASTRODUODENOSCOPY (EGD);  Surgeon: Malissa Hippo, MD;  Location: [facility];  Service: Endoscopy;  Laterality: N/A;   ESOPHAGOGASTRODUODENOSCOPY (EGD) WITH PROPOFOL N/A 02/25/2018   Procedure: ESOPHAGOGASTRODUODENOSCOPY (EGD) WITH PROPOFOL;  Surgeon: Malissa Hippo, MD;  Location: [facility];  Service: Endoscopy;  Laterality: N/A;   FRACTURE  SURGERY     GIVENS CAPSULE STUDY N/A 02/15/2018   Procedure: GIVENS CAPSULE STUDY;  Surgeon: Malissa Hippo, MD;  Location: [facility];  Service: Endoscopy;  Laterality: N/A;   GIVENS CAPSULE STUDY N/A 03/27/2018   Procedure: GIVENS CAPSULE STUDY;  Surgeon: Malissa Hippo, MD;  Location: [facility];  Service: Endoscopy;  Laterality: N/A;   I & D EXTREMITY Left 11/10/2015   Procedure: INCISION AND DRAINAGE LEFT FOOT, AMPUTATION OF LEFT THIRD TOE;  Surgeon: Fransisco Hertz, MD;  Location: [facility] OR;  Service: Vascular;  Laterality: Left;   INCISION AND DRAINAGE ABSCESS Left 06/16/2005   wide exc. abscess 5th toe   IR ANGIOGRAM VISCERAL SELECTIVE  03/28/2018   IR ANGIOGRAM VISCERAL SELECTIVE  03/28/2018   IR CT HEAD LTD  01/04/2019   IR PERCUTANEOUS ART THROMBECTOMY/INFUSION INTRACRANIAL INC DIAG ANGIO  01/04/2019   IR US GUIDE VASC ACCESS LEFT  03/28/2018   MANDIBULAR HARDWARE REMOVAL Left 08/02/2015   Procedure:  HARDWARE REMOVAL TWO MANDIBULAR SCREWS;  Surgeon: Felton Clinton, DDS;  Location: [facility];  Service: Oral Surgery;  Laterality: Left;   ORIF TIBIA FRACTURE Left ~ 1970   PERIPHERAL VASCULAR CATHETERIZATION N/A 08/14/2016   Procedure: Abdominal Aortogram w/Lower Extremity;  Surgeon: Chuck Hint, MD;  Location: [facility] INVASIVE CV LAB;  Service: Cardiovascular;  Laterality: N/A;   PERIPHERAL VASCULAR INTERVENTION Bilateral 08/20/2020   Procedure: PERIPHERAL VASCULAR INTERVENTION;  Surgeon: Chuck Hint, MD;  Location: [facility] INVASIVE CV LAB;  Service: Cardiovascular;  Laterality: Bilateral;  iliac stents   RADIOLOGY WITH ANESTHESIA N/A 01/04/2019   Procedure: CODE STROKE;  Surgeon: Radiologist, Medication, MD;  Location: [facility] OR;  Service: Radiology;  Laterality: N/A;   SQUAMOUS CELL CARCINOMA EXCISION Left 1993   "took my jaw out; got cadavar in there now"   TONSILLECTOMY     Social History   Occupational History   Not on file  Tobacco Use   Smoking status: Never     Passive exposure: Never   Smokeless tobacco: Never  Vaping Use   Vaping status: Never  Used  Substance and Sexual Activity   Alcohol use: No    Alcohol/week: 0.0 standard drinks of alcohol   Drug use: No   Sexual activity: Yes

## 2023-05-25 ENCOUNTER — Encounter: Payer: Self-pay | Admitting: Student

## 2023-05-25 ENCOUNTER — Ambulatory Visit: Payer: Medicare Other | Attending: Student | Admitting: Student

## 2023-05-25 VITALS — BP 142/76 | HR 74 | Ht 68.5 in | Wt 157.0 lb

## 2023-05-25 DIAGNOSIS — I5020 Unspecified systolic (congestive) heart failure: Secondary | ICD-10-CM

## 2023-05-25 DIAGNOSIS — I739 Peripheral vascular disease, unspecified: Secondary | ICD-10-CM | POA: Diagnosis present

## 2023-05-25 DIAGNOSIS — I4821 Permanent atrial fibrillation: Secondary | ICD-10-CM | POA: Diagnosis present

## 2023-05-25 DIAGNOSIS — I25810 Atherosclerosis of coronary artery bypass graft(s) without angina pectoris: Secondary | ICD-10-CM

## 2023-05-25 DIAGNOSIS — I4729 Other ventricular tachycardia: Secondary | ICD-10-CM | POA: Diagnosis not present

## 2023-05-25 DIAGNOSIS — E785 Hyperlipidemia, unspecified: Secondary | ICD-10-CM | POA: Diagnosis not present

## 2023-05-25 DIAGNOSIS — N1832 Chronic kidney disease, stage 3b: Secondary | ICD-10-CM

## 2023-05-25 MED ORDER — METOPROLOL SUCCINATE ER 25 MG PO TB24: 25.0000 mg | ORAL_TABLET | Freq: Every day | ORAL | 3 refills | Status: DC

## 2023-05-25 NOTE — Patient Instructions (Addendum)
Medication Instructions:   INCREASE Toprol XL to 25 mg daily   Labwork: None today  Testing/Procedures: None today  Follow-Up: 3 months  Any Other Special Instructions Will Be Listed Below (If Applicable).  If you need a refill on your cardiac medications before your next appointment, please call your pharmacy.

## 2023-05-25 NOTE — Progress Notes (Signed)
Cardiology Office Note    Date:  05/25/2023  ID:  [name], DOB October 20, 1936, MRN [id number] Cardiologist: Donato Schultz, MD  --> Patient prefers to follow-up in [facility] EP: Dr. Ladona Ridgel  History of Present Illness:    [name] is a 87 y.o. male with past history of CAD (s/p CABG in 1990's, patent bypass grafts by catheterization in 2002, low-risk NST in 2008), HFrEF (EF 20 to 25% by echocardiogram in 07/2022 and repeat cath not pursued given worsening renal function), permanent atrial fibrillation, HTN, HLD, history of GI bleed, PAD (s/p L BKA) and prior CVA who presents to the office today for 6-month follow-up.  He was last examined by Cadence Furth, PA in 11/2022 and denied any recent anginal symptoms at that time. He was continued on Farxiga, Toprol-XL and Lasix with plans to obtain a limited echocardiogram for reassessment of his EF. This showed his EF remained reduced at 20 to 25% and he was again referred back to EP for possible discussion of ICD placement. He did meet with Dr. Ladona Ridgel again earlier this month and the possibility of an ICD was reviewed but was not felt to be a good candidate again given his advanced age and comorbidities.   In talking with the patient and his daughter today, he reports overall feeling well since his last office visit. He denies any recent chest pain or dyspnea on exertion with transfers. No specific orthopnea, PND or pitting edema. He does have a nonhealing wound along his right leg which is being dressed routinely and has follow-up with Vascular next week. He remains on Eliquis for anticoagulation with no reports of active bleeding except for along his wound site when they change his dressings. He does try to follow a low-sodium diet and they mostly eat at home with minimal fast food or takeout.  Studies Reviewed:   EKG: EKG is not ordered today.    Limited Echo: 12/2022 IMPRESSIONS     1. The distal apex is dyskinetic,mildly  aneurysmal.. Left ventricular  ejection fraction, by estimation, is 20 to 25%. The left ventricle has  severely decreased function. The left ventricle demonstrates global  hypokinesis.   2. Right ventricular systolic function is normal. The right ventricular  size is normal.   3. Limited echo assess LV function   Risk Assessment/Calculations:    CHA2DS2-VASc Score = 7   This indicates a 11.2% annual risk of stroke. The patient's score is based upon: CHF History: 1 HTN History: 1 Diabetes History: 0 Stroke History: 2 Vascular Disease History: 1 Age Score: 2 Gender Score: 0    Physical Exam:   VS:  BP (!) 142/76   Pulse 74   Ht 5' 8.5" (1.74 m)   Wt 157 lb (71.2 kg)   SpO2 96%   BMI 23.52 kg/m    Wt Readings from Last 3 Encounters:  05/25/23 157 lb (71.2 kg)  02/28/23 143 lb (64.9 kg)  02/01/23 143 lb (64.9 kg)    GEN: Pleasant, elderly male appearing in no acute distress. Sitting in wheelchair.  NECK: No JVD; No carotid bruits CARDIAC: Irregularly irregular, no murmurs, rubs, gallops RESPIRATORY:  Clear to auscultation without rales, wheezing or rhonchi  ABDOMEN: Appears non-distended. No obvious abdominal masses. EXTREMITIES: Left BKA; Dressing in place along right leg.    Assessment and Plan:   1. Chronic HFrEF - He has a known cardiomyopathy with EF at 20-25% by most recent imaging. Previously not felt to be a  candidate for an ICD.  - He denies any recent respiratory issues and appears euvolemic by examination today. He is currently taking [place] 10mg  daily, Toprol-XL 12.5mg  daily and Lasix 40mg  daily. Will titrate Toprol-XL to 25mg  daily. At the time of his next visit, would consider adding Hydralazine or Nitrates if BP allows. He is not on an ACE-I/ARB/ARNI/MRA given his renal function.    2. NSVT - He previously had a LifeVest in place following NSVT during admission in 07/2022. He did meet with Dr. Ladona Ridgel in 08/2022 and again earlier this year and was felt  to be a very poor candidate for ICD for primary prevention given his advanced age and comorbidities.    3. CAD - He had prior CABG in the 1990's with patent grafts by catheterization in 2002. We previously discussed repeat ischemic evaluation with a stress test and possible catheterization at his prior visit in 08/2022 and he did not wish to undergo additional cardiac procedures. Would also be at high-risk for contrast-induced nephropathy given his renal function.  - Thankfully, he continues to do well and denies any anginal symptoms. Continue Pravastatin and will titrate Toprol-XL to 25mg  daily. Not on ASA given the need for Eliquis.    4. HLD - LDL was at 31 by most recent labs available in our system and we will request most recent labs from his PCP. Continue current medical therapy with Pravastatin 20mg  daily.    5. Permanent Atrial Fibrillation - He denies any recent palpitations and HR is in the 70's today. Will titrate Toprol-XL to 25mg  daily as discussed above.  - No reports of active bleeding. Continue Eliquis 2.5mg  BID for anticoagulation which is the appropriate dose at this time given his age, weight and renal function.    6.  Stage 3 CKD - Baseline creatinine 1.8 - 1.9. At 1.90 in 10/2022. Previously had an AKI in 06/2022 with creatinine peaking at 4.43. He does report having labs with his PCP a few months ago and we will request a copy of these.   7. PAD - He is s/p L BKA and has a non-healing wound along his right leg. He does have scheduled follow-up with Vascular Surgery next week.    Signed, Ellsworth Lennox, PA-C

## 2023-05-29 ENCOUNTER — Telehealth: Payer: Self-pay | Admitting: Family

## 2023-05-29 NOTE — Telephone Encounter (Signed)
[name] from [facility] called stating they are not admitting him for therapy he is doing fine no need for therapy he was upset because he wanted a nurse and it was ordered but they do not have nurse services in that area but he will wait until seeing Cardiologist then try and schedule

## 2023-05-31 ENCOUNTER — Ambulatory Visit (INDEPENDENT_AMBULATORY_CARE_PROVIDER_SITE_OTHER): Payer: Medicare Other | Admitting: Physician Assistant

## 2023-05-31 ENCOUNTER — Ambulatory Visit (HOSPITAL_COMMUNITY)
Admission: RE | Admit: 2023-05-31 | Discharge: 2023-05-31 | Disposition: A | Discharge status: Home / Self Care | Payer: Medicare Other | Source: Ambulatory Visit | Attending: Vascular Surgery | Admitting: Vascular Surgery

## 2023-05-31 VITALS — BP 157/75 | Temp 98.0°F | Resp 20 | Ht 68.5 in | Wt 157.0 lb

## 2023-05-31 DIAGNOSIS — I83009 Varicose veins of unspecified lower extremity with ulcer of unspecified site: Secondary | ICD-10-CM

## 2023-05-31 DIAGNOSIS — L97909 Non-pressure chronic ulcer of unspecified part of unspecified lower leg with unspecified severity: Secondary | ICD-10-CM | POA: Diagnosis not present

## 2023-05-31 DIAGNOSIS — I739 Peripheral vascular disease, unspecified: Secondary | ICD-10-CM | POA: Diagnosis present

## 2023-05-31 DIAGNOSIS — I872 Venous insufficiency (chronic) (peripheral): Secondary | ICD-10-CM

## 2023-05-31 LAB — VAS US ABI WITH/WO TBI

## 2023-05-31 NOTE — Progress Notes (Signed)
Office Note     CC:  follow up Requesting Provider:  Gareth Morgan, MD  HPI: [name] is a 87 y.o. (19-Aug-1936) male who presents for evaluation of right leg wound.  He underwent arteriogram on October 31, 2022 by Dr. Myra Gianotti which demonstrated patent common femoral artery and SFA.  Bilateral iliac stents were also widely patent.  He has two-vessel runoff via the posterior tibial and peroneal arteries.  Since that time he healed his right toe wound.  Unfortunately he developed a large ulceration on his posterior calf.  He also has blistering and weeping in other areas of his right lower leg.  He admittedly keeps his leg down in a dependent position throughout most of the day.  He has limited mobility given his left above-the-knee amputation which was performed in 2017 by Dr. Edilia Bo.  He does not elevate his leg during the day.  He also does not wear compression.  He is on Eliquis for atrial fibrillation.  He denies any fevers, chills, nausea/vomiting.  He is followed regularly by the heart failure clinic with known EF of 20-25%.   Past Medical History:  Diagnosis Date   Arteriosclerotic cardiovascular disease (ASCVD)    CABG in 1990s; 2002 total obstruction of LAD, CX and RCA with patent grafts and nl EF; Stress nuc. 2008 - mild LV dilation; normal EF; questionable small anteroapical scar; no ischemia   Arthritis    "left knee" (11/08/2015)   Atrial fibrillation (HCC)    Cellulitis 11/08/2015   "both feet"   Chronic anticoagulation    Dental crowns present    Diabetic peripheral neuropathy (HCC)    bilateral lower leg, hands   History of blood transfusion    "related to my leg surgery?"   Hypertension    states under control with meds., has been on med. x "long time"   Iron deficiency anemia    Jaw cancer (HCC) 1990s   "squamous cell"   Myocardial infarction (HCC) 1990s   "after my jaw OR"   Pedal edema    "not a lot", per pt.   Peripheral vascular disease (HCC)    Presence  of retained hardware 07/2015   failed hardware jaw   Runny nose 07/27/2015   clear drainage, per pt.   Stroke (HCC)    Type II diabetes mellitus (HCC)    "diet controlled" (11/08/2015)    Past Surgical History:  Procedure Laterality Date   ABDOMINAL AORTOGRAM W/LOWER EXTREMITY Bilateral 08/20/2020   Procedure: ABDOMINAL AORTOGRAM W/LOWER EXTREMITY;  Surgeon: Chuck Hint, MD;  Location: [facility] INVASIVE CV LAB;  Service: Cardiovascular;  Laterality: Bilateral;   ABDOMINAL AORTOGRAM W/LOWER EXTREMITY Right 10/31/2022   Procedure: ABDOMINAL AORTOGRAM W/LOWER EXTREMITY;  Surgeon: Nada Libman, MD;  Location: [facility] INVASIVE CV LAB;  Service: Cardiovascular;  Laterality: Right;   AMPUTATION Left 11/11/2015   Procedure: LEFT ABOVE KNEE AMPUTATION;  Surgeon: Chuck Hint, MD;  Location: [facility] OR;  Service: Vascular;  Laterality: Left;   CARDIAC CATHETERIZATION  1990s; 04/16/2001   CATARACT EXTRACTION W/ INTRAOCULAR LENS  IMPLANT, BILATERAL Bilateral 09/2015   COLONOSCOPY N/A 11/27/2014   Procedure: COLONOSCOPY;  Surgeon: Malissa Hippo, MD;  Location: [facility] ENDO SUITE;  Service: Endoscopy;  Laterality: N/A;  830   CORONARY ARTERY BYPASS GRAFT  1990's   "CABG X4"   ESOPHAGOGASTRODUODENOSCOPY N/A 02/14/2018   Procedure: ESOPHAGOGASTRODUODENOSCOPY (EGD);  Surgeon: Malissa Hippo, MD;  Location: [facility];  Service: Endoscopy;  Laterality: N/A;  ESOPHAGOGASTRODUODENOSCOPY (EGD) WITH PROPOFOL N/A 02/25/2018   Procedure: ESOPHAGOGASTRODUODENOSCOPY (EGD) WITH PROPOFOL;  Surgeon: Malissa Hippo, MD;  Location: [facility];  Service: Endoscopy;  Laterality: N/A;   FRACTURE SURGERY     GIVENS CAPSULE STUDY N/A 02/15/2018   Procedure: GIVENS CAPSULE STUDY;  Surgeon: Malissa Hippo, MD;  Location: [facility];  Service: Endoscopy;  Laterality: N/A;   GIVENS CAPSULE STUDY N/A 03/27/2018   Procedure: GIVENS CAPSULE STUDY;  Surgeon: Malissa Hippo, MD;  Location: [facility];  Service:  Endoscopy;  Laterality: N/A;   I & D EXTREMITY Left 11/10/2015   Procedure: INCISION AND DRAINAGE LEFT FOOT, AMPUTATION OF LEFT THIRD TOE;  Surgeon: Fransisco Hertz, MD;  Location: [facility] OR;  Service: Vascular;  Laterality: Left;   INCISION AND DRAINAGE ABSCESS Left 06/16/2005   wide exc. abscess 5th toe   IR ANGIOGRAM VISCERAL SELECTIVE  03/28/2018   IR ANGIOGRAM VISCERAL SELECTIVE  03/28/2018   IR CT HEAD LTD  01/04/2019   IR PERCUTANEOUS ART THROMBECTOMY/INFUSION INTRACRANIAL INC DIAG ANGIO  01/04/2019   IR US GUIDE VASC ACCESS LEFT  03/28/2018   MANDIBULAR HARDWARE REMOVAL Left 08/02/2015   Procedure:  HARDWARE REMOVAL TWO MANDIBULAR SCREWS;  Surgeon: Felton Clinton, DDS;  Location: [facility];  Service: Oral Surgery;  Laterality: Left;   ORIF TIBIA FRACTURE Left ~ 1970   PERIPHERAL VASCULAR CATHETERIZATION N/A 08/14/2016   Procedure: Abdominal Aortogram w/Lower Extremity;  Surgeon: Chuck Hint, MD;  Location: [facility] INVASIVE CV LAB;  Service: Cardiovascular;  Laterality: N/A;   PERIPHERAL VASCULAR INTERVENTION Bilateral 08/20/2020   Procedure: PERIPHERAL VASCULAR INTERVENTION;  Surgeon: Chuck Hint, MD;  Location: [facility] INVASIVE CV LAB;  Service: Cardiovascular;  Laterality: Bilateral;  iliac stents   RADIOLOGY WITH ANESTHESIA N/A 01/04/2019   Procedure: CODE STROKE;  Surgeon: Radiologist, Medication, MD;  Location: [facility] OR;  Service: Radiology;  Laterality: N/A;   SQUAMOUS CELL CARCINOMA EXCISION Left 1993   "took my jaw out; got cadavar in there now"   TONSILLECTOMY      Social History   Socioeconomic History   Marital status: Married    Spouse name: Not on file   Number of children: Not on file   Years of education: Not on file   Highest education level: Not on file  Occupational History   Not on file  Tobacco Use   Smoking status: Never    Passive exposure: Never   Smokeless tobacco: Never  Vaping Use   Vaping status: Never Used  Substance and Sexual Activity    Alcohol use: No    Alcohol/week: 0.0 standard drinks of alcohol   Drug use: No   Sexual activity: Yes  Other Topics Concern   Not on file  Social History Narrative   Not on file   Social Determinants of Health   Financial Resource Strain: Not on file  Food Insecurity: No Food Insecurity (08/11/2022)   Hunger Vital Sign    Worried About Running Out of Food in the Last Year: Never true    Ran Out of Food in the Last Year: Never true  Transportation Needs: No Transportation Needs (08/11/2022)   PRAPARE - Administrator, Civil Service (Medical): No    Lack of Transportation (Non-Medical): No  Physical Activity: Not on file  Stress: Not on file  Social Connections: Not on file  Intimate Partner Violence: Not At Risk (08/11/2022)   Humiliation, Afraid, Rape, and Kick questionnaire  Fear of Current or Ex-Partner: No    Emotionally Abused: No    Physically Abused: No    Sexually Abused: No    Family History  Problem Relation Age of Onset   Heart disease Mother     Current Outpatient Medications  Medication Sig Dispense Refill   acetaminophen (TYLENOL) 325 MG tablet Take 2 tablets (650 mg total) by mouth every 6 (six) hours as needed for moderate pain. (Patient taking differently: Take 1,000 mg by mouth every 6 (six) hours as needed for moderate pain.) 30 tablet 0   ammonium lactate (AMLACTIN) 12 % cream Apply topically 2 (two) times daily.     apixaban (ELIQUIS) 2.5 MG TABS tablet Take 1 tablet (2.5 mg total) by mouth 2 (two) times daily. 60 tablet 1   augmented betamethasone dipropionate (DIPROLENE-AF) 0.05 % cream Apply topically.     calcium-vitamin D (OSCAL WITH D) 500-5 MG-MCG tablet Take 2 tablets by mouth 2 (two) times daily. 60 tablet 2   carboxymethylcellul-glycerin (OPTIVE) 0.5-0.9 % ophthalmic solution Place 1 drop into both eyes daily as needed for dry eyes.     cetirizine (ZYRTEC) 10 MG tablet Take 10 mg by mouth every morning.     cholecalciferol  (VITAMIN D3) 25 MCG (1000 UNIT) tablet Take 2,000 Units by mouth daily.     dapagliflozin propanediol (FARXIGA) 10 MG TABS tablet Take 1 tablet (10 mg total) by mouth daily. 90 tablet 3   diphenoxylate-atropine (LOMOTIL) 2.5-0.025 MG tablet Take 1 tablet by mouth 4 (four) times daily as needed for diarrhea or loose stools. 30 tablet 1   feeding supplement (BOOST / RESOURCE BREEZE) LIQD Take 1 Container by mouth 2 (two) times daily between meals. (Patient taking differently: Take 237 mLs by mouth 2 (two) times daily between meals. Ensue)     furosemide (LASIX) 40 MG tablet Take 1 tablet (40 mg total) by mouth daily. 30 tablet 3   halobetasol (ULTRAVATE) 0.05 % cream Apply 1 Application topically 2 (two) times daily.     magnesium oxide (MAG-OX) 400 (240 Mg) MG tablet Take 1 tablet (400 mg total) by mouth daily. 30 tablet 3   metoprolol succinate (TOPROL XL) 25 MG 24 hr tablet Take 1 tablet (25 mg total) by mouth daily. 90 tablet 3   Omega-3 Fatty Acids (FISH OIL) 1200 MG CAPS Take 1,200 mg by mouth every morning.     Pediatric Multivitamins-Iron (FLINTSTONES PLUS IRON PO) Take 1 tablet by mouth daily.     potassium chloride SA (KLOR-CON M) 20 MEQ tablet Take 2 tablets (40 mEq total) by mouth daily. 60 tablet 3   pravastatin (PRAVACHOL) 20 MG tablet Take 1 tablet (20 mg total) by mouth every evening. 30 tablet 3   Probiotic Product (PHILLIPS COLON HEALTH PO) Take 1 capsule by mouth daily.      silver sulfADIAZINE (SILVADENE) 1 % cream Apply 1 application topically daily. (Patient taking differently: Apply 1 application  topically daily. Between toes)     sodium bicarbonate 650 MG tablet Take 1,300 mg by mouth 2 (two) times daily.     No current facility-administered medications for this visit.    Allergies  Allergen Reactions   Lipitor [Atorvastatin] Other (See Comments)    SEVERE HEADACHE   Simvastatin Other (See Comments)    MUSCLE ACHES   Penicillins Rash    Has patient had a PCN reaction  causing immediate rash, facial/tongue/throat swelling, SOB or lightheadedness with hypotension: Yes Has patient had a PCN reaction  causing severe rash involving mucus membranes or skin necrosis: No Has patient had a PCN reaction that required hospitalization No Has patient had a PCN reaction occurring within the last 10 years: No If all of the above answers are "NO", then may proceed with Cephalosporin use.    Sulfa Antibiotics Rash    Tolerates silver sulfadiazine cream at home     REVIEW OF SYSTEMS:   [X]  denotes positive finding, [ ]  denotes negative finding Cardiac  Comments:  Chest pain or chest pressure:    Shortness of breath upon exertion:    Short of breath when lying flat:    Irregular heart rhythm:        Vascular    Pain in calf, thigh, or hip brought on by ambulation:    Pain in feet at night that wakes you up from your sleep:     Blood clot in your veins:    Leg swelling:         Pulmonary    Oxygen at home:    Productive cough:     Wheezing:         Neurologic    Sudden weakness in arms or legs:     Sudden numbness in arms or legs:     Sudden onset of difficulty speaking or slurred speech:    Temporary loss of vision in one eye:     Problems with dizziness:         Gastrointestinal    Blood in stool:     Vomited blood:         Genitourinary    Burning when urinating:     Blood in urine:        Psychiatric    Major depression:         Hematologic    Bleeding problems:    Problems with blood clotting too easily:        Skin    Rashes or ulcers:        Constitutional    Fever or chills:      PHYSICAL EXAMINATION:  Vitals:   05/31/23 1202  BP: (!) 157/75  Resp: 20  Temp: 98 F (36.7 C)  TempSrc: Temporal  SpO2: 95%  Weight: 157 lb (71.2 kg)  Height: 5' 8.5" (1.74 m)    General:  WDWN in NAD; vital signs documented above Gait: Not observed HENT: WNL, normocephalic Pulmonary: normal non-labored breathing , without Rales, rhonchi,   wheezing Cardiac: regular HR Abdomen: soft, NT, no masses Skin: without rashes Vascular Exam/Pulses: Multiphasic PT and DP signal; monophasic peroneal signal Extremities: Large skin deficit in posterior calf without exposed tendon; blistering with weeping skin right lower leg medially and laterally; hyperpigmentation changes with pitting edema of right leg below the knee Musculoskeletal: no muscle wasting or atrophy  Neurologic: A&O X 3 Psychiatric:  The pt has Normal affect.   Non-Invasive Vascular Imaging:   ABI/TBIToday's ABIToday's TBI   Previous ABIPrevious TBI  +-------+-----------+--------------+------------+------------+  Right [place]         0.22 great toeNC          0.32 2nd toe  +-------+-----------+--------------+------------+------------+  Left  AKA                      AKA                       +-------+-----------+--------------+------------+-----------     ASSESSMENT/PLAN:: 87 y.o. male here for evaluation of  new right posterior calf wound  -[name] is an 87 year old male who underwent angiogram in January of this year which was diagnostic demonstrating two-vessel runoff to the foot via PTA and peroneal.  He was able to heal his right great toe wound with wound care alone.  He now has a large wound on the back of his right calf which seems to be related to venous insufficiency.  He also has hyperpigmentation with weeping blisters of his anterior shin.  We will place him in an [place] boot today.  We will try to arrange home health or wound clinic for [place] boot changes going forward.  I have also encouraged him to elevate his leg above his heart during the day.  He and his family know to notify us if wounds worsen or fail to improve.   Emilie Rutter, PA-C [facility] [phone]  Clinic MD:   Edilia Bo

## 2023-06-07 ENCOUNTER — Encounter: Payer: Self-pay | Admitting: Internal Medicine

## 2023-06-07 ENCOUNTER — Ambulatory Visit (INDEPENDENT_AMBULATORY_CARE_PROVIDER_SITE_OTHER): Payer: Medicare Other | Admitting: Internal Medicine

## 2023-06-07 VITALS — BP 144/72 | HR 57 | Ht 69.0 in | Wt 157.0 lb

## 2023-06-07 DIAGNOSIS — I739 Peripheral vascular disease, unspecified: Secondary | ICD-10-CM | POA: Diagnosis not present

## 2023-06-07 DIAGNOSIS — I4821 Permanent atrial fibrillation: Secondary | ICD-10-CM

## 2023-06-07 DIAGNOSIS — I5030 Unspecified diastolic (congestive) heart failure: Secondary | ICD-10-CM

## 2023-06-07 DIAGNOSIS — I10 Essential (primary) hypertension: Secondary | ICD-10-CM

## 2023-06-07 DIAGNOSIS — E785 Hyperlipidemia, unspecified: Secondary | ICD-10-CM

## 2023-06-07 DIAGNOSIS — R7303 Prediabetes: Secondary | ICD-10-CM

## 2023-06-07 DIAGNOSIS — I25810 Atherosclerosis of coronary artery bypass graft(s) without angina pectoris: Secondary | ICD-10-CM | POA: Diagnosis not present

## 2023-06-07 DIAGNOSIS — N1832 Chronic kidney disease, stage 3b: Secondary | ICD-10-CM | POA: Insufficient documentation

## 2023-06-07 NOTE — Assessment & Plan Note (Signed)
BP is mildly elevated today.  He attributes this to establishing care with a new provider.  No medication changes have been made.  Consider escalation of antihypertensive therapy if his blood pressure remains elevated at follow-up.

## 2023-06-07 NOTE — Assessment & Plan Note (Signed)
Followed by vascular surgery (Dr. Edilia Bo).  He is currently prescribed pravastatin and Eliquis.  Recently seen for follow-up.

## 2023-06-07 NOTE — Assessment & Plan Note (Signed)
Irregular rhythm detected on exam today.  Recently seen by cardiology for follow-up.  He is currently prescribed Eliquis 2.5 mg twice daily and Toprol-XL 25 mg daily.  No additional medication changes are indicated today.

## 2023-06-07 NOTE — Progress Notes (Signed)
New Patient Office Visit  Subjective    Patient ID: [name], male    DOB: 1936-02-04  Age: 87 y.o. MRN: [id number]  CC:  Chief Complaint  Patient presents with   Establish Care    HPI [name] presents to establish care.  He is an 87 year old gentleman with a past medical history significant for permanent A-fib, PVD s/p left AKA (January 2017), CKD, HFrEF, CAD s/p CABG x 4 (1990s), prediabetes (patient denies history of T2DM), HLD, and SCC of the left jaw.  He was previously followed by Dr. Sudie Bailey.  [name] reports feeling well today.  He is asymptomatic currently and has no acute concerns to discuss aside from desiring to establish care.  He is a retired [employer] account planner.  Denies a history of tobacco, alcohol, or illicit drug use.  He is unaware of any significant family medical history.  Chronic medical conditions and outstanding preventative care items discussed today are individually addressed in A/P below.   Outpatient Encounter Medications as of 06/07/2023  Medication Sig   acetaminophen (TYLENOL) 325 MG tablet Take 2 tablets (650 mg total) by mouth every 6 (six) hours as needed for moderate pain. (Patient taking differently: Take 1,000 mg by mouth every 6 (six) hours as needed for moderate pain.)   apixaban (ELIQUIS) 2.5 MG TABS tablet Take 1 tablet (2.5 mg total) by mouth 2 (two) times daily.   augmented betamethasone dipropionate (DIPROLENE-AF) 0.05 % cream Apply topically.   calcium-vitamin D (OSCAL WITH D) 500-5 MG-MCG tablet Take 2 tablets by mouth 2 (two) times daily.   carboxymethylcellul-glycerin (OPTIVE) 0.5-0.9 % ophthalmic solution Place 1 drop into both eyes daily as needed for dry eyes.   cetirizine (ZYRTEC) 10 MG tablet Take 10 mg by mouth every morning.   cholecalciferol (VITAMIN D3) 25 MCG (1000 UNIT) tablet Take 2,000 Units by mouth daily.   dapagliflozin propanediol (FARXIGA) 10 MG TABS tablet Take 1 tablet (10 mg total) by mouth daily.   feeding  supplement (BOOST / RESOURCE BREEZE) LIQD Take 1 Container by mouth 2 (two) times daily between meals. (Patient taking differently: Take 237 mLs by mouth 2 (two) times daily between meals. Ensue)   furosemide (LASIX) 40 MG tablet Take 1 tablet (40 mg total) by mouth daily.   magnesium oxide (MAG-OX) 400 (240 Mg) MG tablet Take 1 tablet (400 mg total) by mouth daily.   metoprolol succinate (TOPROL XL) 25 MG 24 hr tablet Take 1 tablet (25 mg total) by mouth daily.   Omega-3 Fatty Acids (FISH OIL) 1200 MG CAPS Take 1,200 mg by mouth every morning.   Pediatric Multivitamins-Iron (FLINTSTONES PLUS IRON PO) Take 1 tablet by mouth daily.   potassium chloride SA (KLOR-CON M) 20 MEQ tablet Take 2 tablets (40 mEq total) by mouth daily.   pravastatin (PRAVACHOL) 20 MG tablet Take 1 tablet (20 mg total) by mouth every evening.   Probiotic Product (PHILLIPS COLON HEALTH PO) Take 1 capsule by mouth daily.    silver sulfADIAZINE (SILVADENE) 1 % cream Apply 1 application topically daily. (Patient taking differently: Apply 1 application  topically daily. Between toes)   sodium bicarbonate 650 MG tablet Take 1,300 mg by mouth 2 (two) times daily.   [DISCONTINUED] ammonium lactate (AMLACTIN) 12 % cream Apply topically 2 (two) times daily.   [DISCONTINUED] diphenoxylate-atropine (LOMOTIL) 2.5-0.025 MG tablet Take 1 tablet by mouth 4 (four) times daily as needed for diarrhea or loose stools.   [DISCONTINUED] halobetasol (ULTRAVATE) 0.05 % cream  Apply 1 Application topically 2 (two) times daily.   No facility-administered encounter medications on file as of 06/07/2023.    Past Medical History:  Diagnosis Date   Arteriosclerotic cardiovascular disease (ASCVD)    CABG in 1990s; 2002 total obstruction of LAD, CX and RCA with patent grafts and nl EF; Stress nuc. 2008 - mild LV dilation; normal EF; questionable small anteroapical scar; no ischemia   Arthritis    "left knee" (11/08/2015)   Atrial fibrillation (HCC)     Cellulitis 11/08/2015   "both feet"   Chronic anticoagulation    Dental crowns present    Diabetic peripheral neuropathy (HCC)    bilateral lower leg, hands   History of blood transfusion    "related to my leg surgery?"   Hypertension    states under control with meds., has been on med. x "long time"   Iron deficiency anemia    Jaw cancer (HCC) 1990s   "squamous cell"   Myocardial infarction (HCC) 1990s   "after my jaw OR"   Pedal edema    "not a lot", per pt.   Peripheral vascular disease (HCC)    Presence of retained hardware 07/2015   failed hardware jaw   Runny nose 07/27/2015   clear drainage, per pt.   Stroke (HCC)    Type II diabetes mellitus (HCC)    "diet controlled" (11/08/2015)    Past Surgical History:  Procedure Laterality Date   ABDOMINAL AORTOGRAM W/LOWER EXTREMITY Bilateral 08/20/2020   Procedure: ABDOMINAL AORTOGRAM W/LOWER EXTREMITY;  Surgeon: Chuck Hint, MD;  Location: [facility] INVASIVE CV LAB;  Service: Cardiovascular;  Laterality: Bilateral;   ABDOMINAL AORTOGRAM W/LOWER EXTREMITY Right 10/31/2022   Procedure: ABDOMINAL AORTOGRAM W/LOWER EXTREMITY;  Surgeon: Nada Libman, MD;  Location: [facility] INVASIVE CV LAB;  Service: Cardiovascular;  Laterality: Right;   AMPUTATION Left 11/11/2015   Procedure: LEFT ABOVE KNEE AMPUTATION;  Surgeon: Chuck Hint, MD;  Location[facility] OR;  Service: Vascular;  Laterality: Left;   CARDIAC CATHETERIZATION  1990s; 04/16/2001   CATARACT EXTRACTION W/ INTRAOCULAR LENS  IMPLANT, BILATERAL Bilateral 09/2015   COLONOSCOPY N/A 11/27/2014   Procedure: COLONOSCOPY;  Surgeon: Malissa Hippo, MD;  Location: [facility];  Service: Endoscopy;  Laterality: N/A;  830   CORONARY ARTERY BYPASS GRAFT  1990's   "CABG X4"   ESOPHAGOGASTRODUODENOSCOPY N/A 02/14/2018   Procedure: ESOPHAGOGASTRODUODENOSCOPY (EGD);  Surgeon: Malissa Hippo, MD;  Location: [facility];  Service: Endoscopy;  Laterality: N/A;   ESOPHAGOGASTRODUODENOSCOPY  (EGD) WITH PROPOFOL N/A 02/25/2018   Procedure: ESOPHAGOGASTRODUODENOSCOPY (EGD) WITH PROPOFOL;  Surgeon: Malissa Hippo, MD;  Location: [facility];  Service: Endoscopy;  Laterality: N/A;   FRACTURE SURGERY     GIVENS CAPSULE STUDY N/A 02/15/2018   Procedure: GIVENS CAPSULE STUDY;  Surgeon: Malissa Hippo, MD;  Location: [facility];  Service: Endoscopy;  Laterality: N/A;   GIVENS CAPSULE STUDY N/A 03/27/2018   Procedure: GIVENS CAPSULE STUDY;  Surgeon: Malissa Hippo, MD;  Location: [facility];  Service: Endoscopy;  Laterality: N/A;   I & D EXTREMITY Left 11/10/2015   Procedure: INCISION AND DRAINAGE LEFT FOOT, AMPUTATION OF LEFT THIRD TOE;  Surgeon: Fransisco Hertz, MD;  Location: [facility] OR;  Service: Vascular;  Laterality: Left;   INCISION AND DRAINAGE ABSCESS Left 06/16/2005   wide exc. abscess 5th toe   IR ANGIOGRAM VISCERAL SELECTIVE  03/28/2018   IR ANGIOGRAM VISCERAL SELECTIVE  03/28/2018   IR CT HEAD LTD  01/04/2019   IR PERCUTANEOUS ART THROMBECTOMY/INFUSION INTRACRANIAL INC DIAG ANGIO  01/04/2019   IR US GUIDE VASC ACCESS LEFT  03/28/2018   MANDIBULAR HARDWARE REMOVAL Left 08/02/2015   Procedure:  HARDWARE REMOVAL TWO MANDIBULAR SCREWS;  Surgeon: Felton Clinton, DDS;  Location: [facility];  Service: Oral Surgery;  Laterality: Left;   ORIF TIBIA FRACTURE Left ~ 1970   PERIPHERAL VASCULAR CATHETERIZATION N/A 08/14/2016   Procedure: Abdominal Aortogram w/Lower Extremity;  Surgeon: Chuck Hint, MD;  Location: [facility] INVASIVE CV LAB;  Service: Cardiovascular;  Laterality: N/A;   PERIPHERAL VASCULAR INTERVENTION Bilateral 08/20/2020   Procedure: PERIPHERAL VASCULAR INTERVENTION;  Surgeon: Chuck Hint, MD;  Location: [facility] INVASIVE CV LAB;  Service: Cardiovascular;  Laterality: Bilateral;  iliac stents   RADIOLOGY WITH ANESTHESIA N/A 01/04/2019   Procedure: CODE STROKE;  Surgeon: Radiologist, Medication, MD;  Location: [facility] OR;  Service: Radiology;  Laterality: N/A;    SQUAMOUS CELL CARCINOMA EXCISION Left 1993   "took my jaw out; got cadavar in there now"   TONSILLECTOMY      Family History  Problem Relation Age of Onset   Heart disease Mother     Social History   Socioeconomic History   Marital status: Married    Spouse name: Not on file   Number of children: Not on file   Years of education: Not on file   Highest education level: Not on file  Occupational History   Not on file  Tobacco Use   Smoking status: Never    Passive exposure: Never   Smokeless tobacco: Never  Vaping Use   Vaping status: Never Used  Substance and Sexual Activity   Alcohol use: No    Alcohol/week: 0.0 standard drinks of alcohol   Drug use: No   Sexual activity: Yes  Other Topics Concern   Not on file  Social History Narrative   Not on file   Social Determinants of Health   Financial Resource Strain: Not on file  Food Insecurity: No Food Insecurity (08/11/2022)   Hunger Vital Sign    Worried About Running Out of Food in the Last Year: Never true    Ran Out of Food in the Last Year: Never true  Transportation Needs: No Transportation Needs (08/11/2022)   PRAPARE - Administrator, Civil Service (Medical): No    Lack of Transportation (Non-Medical): No  Physical Activity: Not on file  Stress: Not on file  Social Connections: Not on file  Intimate Partner Violence: Not At Risk (08/11/2022)   Humiliation, Afraid, Rape, and Kick questionnaire    Fear of Current or Ex-Partner: No    Emotionally Abused: No    Physically Abused: No    Sexually Abused: No   Review of Systems  Constitutional:  Negative for chills and fever.  HENT:  Negative for sore throat.   Respiratory:  Negative for cough and shortness of breath.   Cardiovascular:  Negative for chest pain, palpitations and leg swelling.  Gastrointestinal:  Negative for abdominal pain, blood in stool, constipation, diarrhea, nausea and vomiting.  Genitourinary:  Negative for dysuria and  hematuria.  Musculoskeletal:  Negative for myalgias.  Skin:  Negative for itching and rash.  Neurological:  Negative for dizziness and headaches.  Psychiatric/Behavioral:  Negative for depression and suicidal ideas.    Objective    BP (!) 144/72   Pulse (!) 57   Ht 5\' 9"  (1.753 m)   Wt 157 lb (71.2 kg)  SpO2 93%   BMI 23.18 kg/m   Physical Exam Vitals reviewed.  Constitutional:      General: He is not in acute distress.    Appearance: Normal appearance. He is not ill-appearing.     Comments: Examined in wheelchair  HENT:     Head: Normocephalic and atraumatic.     Right Ear: External ear normal.     Left Ear: External ear normal.     Nose: Nose normal. No congestion or rhinorrhea.     Mouth/Throat:     Mouth: Mucous membranes are moist.     Pharynx: Oropharynx is clear.  Eyes:     General: No scleral icterus.    Extraocular Movements: Extraocular movements intact.     Conjunctiva/sclera: Conjunctivae normal.     Pupils: Pupils are equal, round, and reactive to light.  Cardiovascular:     Rate and Rhythm: Normal rate. Rhythm irregular.     Pulses: Normal pulses.     Heart sounds: Normal heart sounds. No murmur heard. Pulmonary:     Effort: Pulmonary effort is normal.     Breath sounds: Normal breath sounds. No wheezing, rhonchi or rales.  Abdominal:     General: Abdomen is flat. Bowel sounds are normal. There is no distension.     Palpations: Abdomen is soft.     Tenderness: There is no abdominal tenderness.  Musculoskeletal:        General: Signs of injury (Right lower extremity wound dressing in place) present. No swelling or deformity.     Cervical back: Normal range of motion.     Comments: Left AKA  Skin:    General: Skin is warm and dry.     Capillary Refill: Capillary refill takes less than 2 seconds.  Neurological:     General: No focal deficit present.     Mental Status: He is alert and oriented to person, place, and time.     Motor: No weakness.   Psychiatric:        Mood and Affect: Mood normal.        Behavior: Behavior normal.        Thought Content: Thought content normal.    Assessment & Plan:   Problem List Items Addressed This Visit      Essential hypertension    BP is mildly elevated today.  He attributes this to establishing care with a new provider.  No medication changes have been made.  Consider escalation of antihypertensive therapy if his blood pressure remains elevated at follow-up.      Permanent atrial fibrillation (HCC)    Irregular rhythm detected on exam today.  Recently seen by cardiology for follow-up.  He is currently prescribed Eliquis 2.5 mg twice daily and Toprol-XL 25 mg daily.  No additional medication changes are indicated today.      PVD (peripheral vascular disease) with claudication (HCC) - Primary    Followed by vascular surgery (Dr. Edilia Bo).  He is currently prescribed pravastatin and Eliquis.  Recently seen for follow-up.      Coronary artery disease involving coronary bypass graft of native heart without angina pectoris    History of CAD s/p CABG x 4.  Denies recent chest pain.  He is currently prescribed pravastatin, Eliquis, and metoprolol.  No medication changes are indicated today.      Congestive heart failure (CHF) (HCC)    History of HFrEF.  EF 20-25% on TTE from March.  He is euvolemic on exam.  Per cardiology, not  a candidate for ICD.  He is on appropriate GDMT.  No medication changes are indicated today.      CKD stage 3b, GFR 30-44 ml/min (HCC)    CKD 3B based on latest available labs.  He is currently prescribed Farxiga.  States that labs were updated by his former PCP recently.  We will attempt to obtain those results.      Prediabetes    Both the patient and his wife deny a previously documented diagnosis of T2DM.  He states that he has been borderline diabetic but never met criteria for diabetes mellitus.  Repeat A1c ordered today.      Dyslipidemia    He is  currently prescribed pravastatin 20 mg daily.  Lipid panel recently updated by his former PCP.  We will request previous lab results today.       Return in about 3 months (around 09/07/2023).   Billie Lade, MD

## 2023-06-07 NOTE — Assessment & Plan Note (Signed)
CKD 3B based on latest available labs.  He is currently prescribed Farxiga.  States that labs were updated by his former PCP recently.  We will attempt to obtain those results.

## 2023-06-07 NOTE — Assessment & Plan Note (Signed)
History of HFrEF.  EF 20-25% on TTE from March.  He is euvolemic on exam.  Per cardiology, not a candidate for ICD.  He is on appropriate GDMT.  No medication changes are indicated today.

## 2023-06-07 NOTE — Assessment & Plan Note (Signed)
History of CAD s/p CABG x 4.  Denies recent chest pain.  He is currently prescribed pravastatin, Eliquis, and metoprolol.  No medication changes are indicated today.

## 2023-06-07 NOTE — Patient Instructions (Addendum)
It was a pleasure to see you today.  Thank you for giving [place] the opportunity to be involved in your care.  Below is a brief recap of your visit and next steps.  We will plan to see you again in 3 months.  Summary You have established care today No medication changes have been made We will attempt to get your recent labs from Dr. Michelle Nasuti office Follow up in 3 months for routine care   Schedule your Medicare Annual Wellness Visit at checkout.

## 2023-06-07 NOTE — Assessment & Plan Note (Signed)
Both the patient and his wife deny a previously documented diagnosis of T2DM.  He states that he has been borderline diabetic but never met criteria for diabetes mellitus.  Repeat A1c ordered today.

## 2023-06-07 NOTE — Assessment & Plan Note (Signed)
He is currently prescribed pravastatin 20 mg daily.  Lipid panel recently updated by his former PCP.  We will request previous lab results today.

## 2023-08-31 ENCOUNTER — Ambulatory Visit: Payer: Medicare Other | Attending: Student | Admitting: Student

## 2023-08-31 ENCOUNTER — Encounter: Payer: Self-pay | Admitting: Student

## 2023-08-31 VITALS — BP 118/70 | HR 68 | Ht 68.5 in | Wt 174.0 lb

## 2023-08-31 DIAGNOSIS — N1832 Chronic kidney disease, stage 3b: Secondary | ICD-10-CM | POA: Diagnosis present

## 2023-08-31 DIAGNOSIS — I4729 Other ventricular tachycardia: Secondary | ICD-10-CM | POA: Diagnosis present

## 2023-08-31 DIAGNOSIS — I25810 Atherosclerosis of coronary artery bypass graft(s) without angina pectoris: Secondary | ICD-10-CM | POA: Insufficient documentation

## 2023-08-31 DIAGNOSIS — E785 Hyperlipidemia, unspecified: Secondary | ICD-10-CM | POA: Insufficient documentation

## 2023-08-31 DIAGNOSIS — I4821 Permanent atrial fibrillation: Secondary | ICD-10-CM | POA: Diagnosis present

## 2023-08-31 DIAGNOSIS — I5022 Chronic systolic (congestive) heart failure: Secondary | ICD-10-CM | POA: Insufficient documentation

## 2023-08-31 NOTE — Progress Notes (Signed)
Cardiology Office Note    Date:  08/31/2023  ID:  [name], DOB August 24, 1936, MRN [id number] Cardiologist: Donato Schultz, MD    History of Present Illness:    [name] is a 87 y.o. male  with past history of CAD (s/p CABG in 1990's, patent bypass grafts by catheterization in 2002, low-risk NST in 2008), HFrEF (EF 20 to 25% by echocardiogram in 07/2022 and repeat cath not pursued given worsening renal function), permanent atrial fibrillation, HTN, HLD, history of GI bleed, PAD (s/p L AKA) and prior CVA who presents to the office today for 41-month follow-up.  He was last examined by myself in 05/2023 and had recently been evaluated by Dr. Ladona Ridgel and not felt to be a candidate for an ICD given his advanced age and comorbidities. He was mostly wheelchair-bound but denied any anginal symptoms with transfers. Repeat ischemic evaluation had previously been reviewed with the patient but not pursued as he did not wish to undergo additional cardiac procedures and would be at high-risk for a cardiac catheterization given the risk of contrast-induced nephropathy. He was continued on his current medical therapy with Farxiga 10 mg daily, Toprol-XL 12.5 mg daily, Lasix 40 mg daily, Eliquis 2.5 mg twice daily and Pravastatin 20 mg daily.  In talking with the patient, his wife and his 37 yo great-grandson today, he reports overall doing well since his last office visit. He is mostly wheelchair-bound but denies any recent chest pain or dyspnea on exertion with activity. Denies any specific orthopnea, PND or pitting edema. He did have wounds along his right lower extremity and this is being followed by wound care and has improved per their report. He remains on Eliquis for anticoagulation with no reports of active bleeding.  He still takes Lasix 40 mg daily and reports frequent urination with this.  Studies Reviewed:   EKG: EKG is ordered today and demonstrates:   EKG Interpretation Date/Time:  Friday  August 31 2023 14:02:20 EST Ventricular Rate:  60 PR Interval:    QRS Duration:  104 QT Interval:  440 QTC Calculation: 440 R Axis:   55  Text Interpretation: Atrial fibrillation TWI along inferior and lateral leads.  Confirmed by [name] ([id number]) on 08/31/2023 4:08:14 PM       Limited Echocardiogram: 12/2022 IMPRESSIONS     1. The distal apex is dyskinetic,mildly aneurysmal.. Left ventricular  ejection fraction, by estimation, is 20 to 25%. The left ventricle has  severely decreased function. The left ventricle demonstrates global  hypokinesis.   2. Right ventricular systolic function is normal. The right ventricular  size is normal.   3. Limited echo assess LV function   Risk Assessment/Calculations:    CHA2DS2-VASc Score = 7   This indicates a 11.2% annual risk of stroke. The patient's score is based upon: CHF History: 1 HTN History: 1 Diabetes History: 0 Stroke History: 2 Vascular Disease History: 1 Age Score: 2 Gender Score: 0    Physical Exam:   VS:  BP 118/70   Pulse 68   Ht 5' 8.5" (1.74 m)   Wt 174 lb (78.9 kg)   SpO2 95%   BMI 26.07 kg/m    Wt Readings from Last 3 Encounters:  08/31/23 174 lb (78.9 kg)  06/07/23 157 lb (71.2 kg)  05/31/23 157 lb (71.2 kg)     GEN: Pleasant, elderly male appearing in no acute distress. Sitting in wheelchair.  NECK: No JVD; No carotid bruits CARDIAC: Irregularly irregular, 2/6 systolic  murmur along RUSB RESPIRATORY:  Clear to auscultation without rales, wheezing or rhonchi  ABDOMEN: Appears non-distended. No obvious abdominal masses. EXTREMITIES: Left AKA. Right lower extremity wrapped.    Assessment and Plan:   1. Chronic HFrEF - He has a known cardiomyopathy with EF at 20 to 25% by most recent imaging in 12/2022. He appears euvolemic by examination today and denies any recent respiratory issues. Continue current medical therapy with Farxiga 10 mg daily, Lasix 40 mg daily and Toprol-XL 25 mg  daily. Will recheck a BMET. He is not on an ACE-I/ARB/ARNI/MRA given his renal function.  2. CAD/HLD - He underwent CABG in the 1990's with patent grafts by catheterization in 2002. Options of a repeat stress test were previously reviewed but he did not wish to undergo additional cardiac procedures and was felt to be too high-risk for a cardiac catheterization given his CKD. - He does have TWI on his EKG but denies any anginal symptoms. He is not on ASA given the need for anticoagulation. Continue Toprol-XL 25 mg daily and Pravastatin 20 mg daily (LDL was at 31 when checked most recently).  3. History of NSVT - Previously evaluated by EP and felt to be a poor candidate for ICD for primary pension given his advanced age and comorbidities.  4. Permanent Atrial Fibrillation - Has denies any recent palpitations and his heart rate is well-controlled in the 60's during today's visit. He remains on Toprol-XL 25 mg daily. - No reports of active bleeding. Continue Eliquis 2.5 mg twice daily for anticoagulation which is the appropriate dose at this time given his age, weight and renal function. Will recheck a CBC and BMET.   5. Stage 3-4 CKD - Baseline creatinine 1.9 - 2.0. He has not had recent labs and we will obtain a repeat BMET. He is scheduled to see his PCP next week and was provided with a lab slip to have labs obtained at that time.  Signed, Ellsworth Lennox, PA-C

## 2023-08-31 NOTE — Patient Instructions (Signed)
Medication Instructions:   Your physician recommends that you continue on your current medications as directed. Please refer to the Current Medication list given to you today.   Labwork: CBC,BMET next week  Testing/Procedures: None today  Follow-Up: 4 months  Any Other Special Instructions Will Be Listed Below (If Applicable).  If you need a refill on your cardiac medications before your next appointment, please call your pharmacy.

## 2023-09-07 ENCOUNTER — Ambulatory Visit (INDEPENDENT_AMBULATORY_CARE_PROVIDER_SITE_OTHER): Payer: Medicare Other | Admitting: Internal Medicine

## 2023-09-07 ENCOUNTER — Encounter: Payer: Self-pay | Admitting: Internal Medicine

## 2023-09-07 VITALS — BP 128/70 | HR 70 | Resp 16 | Ht 68.5 in

## 2023-09-07 DIAGNOSIS — I739 Peripheral vascular disease, unspecified: Secondary | ICD-10-CM | POA: Diagnosis not present

## 2023-09-07 DIAGNOSIS — I10 Essential (primary) hypertension: Secondary | ICD-10-CM

## 2023-09-07 DIAGNOSIS — I4821 Permanent atrial fibrillation: Secondary | ICD-10-CM

## 2023-09-07 DIAGNOSIS — N1832 Chronic kidney disease, stage 3b: Secondary | ICD-10-CM

## 2023-09-07 DIAGNOSIS — R7303 Prediabetes: Secondary | ICD-10-CM

## 2023-09-07 DIAGNOSIS — I5030 Unspecified diastolic (congestive) heart failure: Secondary | ICD-10-CM

## 2023-09-07 DIAGNOSIS — Z23 Encounter for immunization: Secondary | ICD-10-CM | POA: Diagnosis not present

## 2023-09-07 DIAGNOSIS — E785 Hyperlipidemia, unspecified: Secondary | ICD-10-CM

## 2023-09-07 MED ORDER — DAPAGLIFLOZIN PROPANEDIOL 10 MG PO TABS: 10.0000 mg | ORAL_TABLET | Freq: Every day | ORAL | 3 refills | Status: DC

## 2023-09-07 NOTE — Assessment & Plan Note (Signed)
Currently prescribed pravastatin 20 mg daily.  Repeat lipid panel ordered today.

## 2023-09-07 NOTE — Assessment & Plan Note (Signed)
CKD 3B.  Repeat labs ordered today.  He remains on Farxiga 10 mg daily.

## 2023-09-07 NOTE — Assessment & Plan Note (Signed)
HFrEF.  EF 20-25% on TTE from March.  Remains euvolemic on exam.  He is prescribed Farxiga, Toprol-XL, and Lasix.  Not a candidate for ICD per cardiology.  No medication changes are indicated today.

## 2023-09-07 NOTE — Patient Instructions (Signed)
It was a pleasure to see you today.  Thank you for giving [place] the opportunity to be involved in your care.  Below is a brief recap of your visit and next steps.  We will plan to see you again in 3 months.  Summary NO medication changes today Repeat labs ordered Follow up in 3 months

## 2023-09-07 NOTE — Assessment & Plan Note (Signed)
Noted on previous labs.  Repeat A1c ordered today.  Currently prescribed Farxiga 10 mg daily.

## 2023-09-07 NOTE — Assessment & Plan Note (Signed)
Influenza vaccine administered today.

## 2023-09-07 NOTE — Assessment & Plan Note (Signed)
Remains adequately controlled with Toprol-XL 25 mg daily.  No medication changes are indicated today.

## 2023-09-07 NOTE — Assessment & Plan Note (Signed)
Irregularly irregular rate and rhythm detected on exam again today.  He remains on renally dosed Eliquis and Toprol-XL.  No medication changes are indicated today.  Recently seen by cardiology for follow-up.

## 2023-09-07 NOTE — Progress Notes (Signed)
Established Patient Office Visit  Subjective   Patient ID: [name], male    DOB: 1935-12-20  Age: 87 y.o. MRN: [id number]  Chief Complaint  Patient presents with   Follow-up   Mr. Schellenberg returns to care today for routine follow-up.  He was last evaluated by me on 8/15 as a new patient presenting to establish care.  No medication changes were made at that time and 78-month follow-up was arranged.  In the interim, he has been evaluated by cardiology for follow-up.  There have otherwise been no acute interval events.  Mr. Pezza reports feeling well today.  He is asymptomatic and has no acute concerns to discuss.  He is celebrating his 87th birthday.  Past Medical History:  Diagnosis Date   Arteriosclerotic cardiovascular disease (ASCVD)    CABG in 1990s; 2002 total obstruction of LAD, CX and RCA with patent grafts and nl EF; Stress nuc. 2008 - mild LV dilation; normal EF; questionable small anteroapical scar; no ischemia   Arthritis    "left knee" (11/08/2015)   Atrial fibrillation (HCC)    Cellulitis 11/08/2015   "both feet"   Chronic anticoagulation    Dental crowns present    Diabetic peripheral neuropathy (HCC)    bilateral lower leg, hands   History of blood transfusion    "related to my leg surgery?"   Hypertension    states under control with meds., has been on med. x "long time"   Iron deficiency anemia    Jaw cancer (HCC) 1990s   "squamous cell"   Myocardial infarction (HCC) 1990s   "after my jaw OR"   Pedal edema    "not a lot", per pt.   Peripheral vascular disease (HCC)    Presence of retained hardware 07/2015   failed hardware jaw   Runny nose 07/27/2015   clear drainage, per pt.   Stroke (HCC)    Type II diabetes mellitus (HCC)    "diet controlled" (11/08/2015)   Past Surgical History:  Procedure Laterality Date   ABDOMINAL AORTOGRAM W/LOWER EXTREMITY Bilateral 08/20/2020   Procedure: ABDOMINAL AORTOGRAM W/LOWER EXTREMITY;  Surgeon: Chuck Hint, MD;  Location: [facility] INVASIVE CV LAB;  Service: Cardiovascular;  Laterality: Bilateral;   ABDOMINAL AORTOGRAM W/LOWER EXTREMITY Right 10/31/2022   Procedure: ABDOMINAL AORTOGRAM W/LOWER EXTREMITY;  Surgeon: Nada Libman, MD;  Location: [facility] INVASIVE CV LAB;  Service: Cardiovascular;  Laterality: Right;   AMPUTATION Left 11/11/2015   Procedure: LEFT ABOVE KNEE AMPUTATION;  Surgeon: Chuck Hint, MD;  Location: [facility] OR;  Service: Vascular;  Laterality: Left;   CARDIAC CATHETERIZATION  1990s; 04/16/2001   CATARACT EXTRACTION W/ INTRAOCULAR LENS  IMPLANT, BILATERAL Bilateral 09/2015   COLONOSCOPY N/A 11/27/2014   Procedure: COLONOSCOPY;  Surgeon: Malissa Hippo, MD;  Location: [facility];  Service: Endoscopy;  Laterality: N/A;  830   CORONARY ARTERY BYPASS GRAFT  1990's   "CABG X4"   ESOPHAGOGASTRODUODENOSCOPY N/A 02/14/2018   Procedure: ESOPHAGOGASTRODUODENOSCOPY (EGD);  Surgeon: Malissa Hippo, MD;  Location: [facility];  Service: Endoscopy;  Laterality: N/A;   ESOPHAGOGASTRODUODENOSCOPY (EGD) WITH PROPOFOL N/A 02/25/2018   Procedure: ESOPHAGOGASTRODUODENOSCOPY (EGD) WITH PROPOFOL;  Surgeon: Malissa Hippo, MD;  Location: [facility];  Service: Endoscopy;  Laterality: N/A;   FRACTURE SURGERY     GIVENS CAPSULE STUDY N/A 02/15/2018   Procedure: GIVENS CAPSULE STUDY;  Surgeon: Malissa Hippo, MD;  Location: [facility];  Service: Endoscopy;  Laterality: N/A;   GIVENS  CAPSULE STUDY N/A 03/27/2018   Procedure: GIVENS CAPSULE STUDY;  Surgeon: Malissa Hippo, MD;  Location: [facility];  Service: Endoscopy;  Laterality: N/A;   I & D EXTREMITY Left 11/10/2015   Procedure: INCISION AND DRAINAGE LEFT FOOT, AMPUTATION OF LEFT THIRD TOE;  Surgeon: Fransisco Hertz, MD;  Location: [facility] OR;  Service: Vascular;  Laterality: Left;   INCISION AND DRAINAGE ABSCESS Left 06/16/2005   wide exc. abscess 5th toe   IR ANGIOGRAM VISCERAL SELECTIVE  03/28/2018   IR ANGIOGRAM VISCERAL SELECTIVE  03/28/2018   IR  CT HEAD LTD  01/04/2019   IR PERCUTANEOUS ART THROMBECTOMY/INFUSION INTRACRANIAL INC DIAG ANGIO  01/04/2019   IR US GUIDE VASC ACCESS LEFT  03/28/2018   MANDIBULAR HARDWARE REMOVAL Left 08/02/2015   Procedure:  HARDWARE REMOVAL TWO MANDIBULAR SCREWS;  Surgeon: Felton Clinton, DDS;  Location: [facility];  Service: Oral Surgery;  Laterality: Left;   ORIF TIBIA FRACTURE Left ~ 1970   PERIPHERAL VASCULAR CATHETERIZATION N/A 08/14/2016   Procedure: Abdominal Aortogram w/Lower Extremity;  Surgeon: Chuck Hint, MD;  Location: [facility] INVASIVE CV LAB;  Service: Cardiovascular;  Laterality: N/A;   PERIPHERAL VASCULAR INTERVENTION Bilateral 08/20/2020   Procedure: PERIPHERAL VASCULAR INTERVENTION;  Surgeon: Chuck Hint, MD;  Location: [facility] INVASIVE CV LAB;  Service: Cardiovascular;  Laterality: Bilateral;  iliac stents   RADIOLOGY WITH ANESTHESIA N/A 01/04/2019   Procedure: CODE STROKE;  Surgeon: Radiologist, Medication, MD;  Location: [facility] OR;  Service: Radiology;  Laterality: N/A;   SQUAMOUS CELL CARCINOMA EXCISION Left 1993   "took my jaw out; got cadavar in there now"   TONSILLECTOMY     Social History   Tobacco Use   Smoking status: Never    Passive exposure: Never   Smokeless tobacco: Never  Vaping Use   Vaping status: Never Used  Substance Use Topics   Alcohol use: No    Alcohol/week: 0.0 standard drinks of alcohol   Drug use: No   Family History  Problem Relation Age of Onset   Heart disease Mother    Allergies  Allergen Reactions   Lipitor [Atorvastatin] Other (See Comments)    SEVERE HEADACHE   Simvastatin Other (See Comments)    MUSCLE ACHES   Cefuroxime Diarrhea   Penicillins Rash    Has patient had a PCN reaction causing immediate rash, facial/tongue/throat swelling, SOB or lightheadedness with hypotension: Yes Has patient had a PCN reaction causing severe rash involving mucus membranes or skin necrosis: No Has patient had a PCN reaction that required  hospitalization No Has patient had a PCN reaction occurring within the last 10 years: No If all of the above answers are "NO", then may proceed with Cephalosporin use.    Sulfa Antibiotics Rash    Tolerates silver sulfadiazine cream at home   Review of Systems  Constitutional:  Negative for chills and fever.  HENT:  Negative for sore throat.   Respiratory:  Negative for cough and shortness of breath.   Cardiovascular:  Negative for chest pain, palpitations and leg swelling.  Gastrointestinal:  Negative for abdominal pain, blood in stool, constipation, diarrhea, nausea and vomiting.  Genitourinary:  Negative for dysuria and hematuria.  Musculoskeletal:  Negative for myalgias.  Skin:  Negative for itching and rash.  Neurological:  Negative for dizziness and headaches.  Psychiatric/Behavioral:  Negative for depression and suicidal ideas.      Objective:     BP 128/70   Pulse 70   Resp 16  Ht 5' 8.5" (1.74 m)   SpO2 97%   BMI 26.07 kg/m  BP Readings from Last 3 Encounters:  09/07/23 128/70  08/31/23 118/70  06/07/23 (!) 144/72   Physical Exam Vitals reviewed.  Constitutional:      General: He is not in acute distress.    Appearance: Normal appearance. He is not ill-appearing.     Comments: Examined in wheelchair  HENT:     Head: Normocephalic and atraumatic.     Right Ear: External ear normal.     Left Ear: External ear normal.     Nose: Nose normal. No congestion or rhinorrhea.     Mouth/Throat:     Mouth: Mucous membranes are moist.     Pharynx: Oropharynx is clear.  Eyes:     General: No scleral icterus.    Extraocular Movements: Extraocular movements intact.     Conjunctiva/sclera: Conjunctivae normal.     Pupils: Pupils are equal, round, and reactive to light.  Cardiovascular:     Rate and Rhythm: Normal rate. Rhythm irregular.     Pulses: Normal pulses.     Heart sounds: Normal heart sounds. No murmur heard. Pulmonary:     Effort: Pulmonary effort is  normal.     Breath sounds: Normal breath sounds. No wheezing, rhonchi or rales.  Abdominal:     General: Abdomen is flat. Bowel sounds are normal. There is no distension.     Palpations: Abdomen is soft.     Tenderness: There is no abdominal tenderness.  Musculoskeletal:        General: No swelling or deformity.     Cervical back: Normal range of motion.     Comments: Left AKA  Skin:    General: Skin is warm and dry.     Capillary Refill: Capillary refill takes less than 2 seconds.  Neurological:     General: No focal deficit present.     Mental Status: He is alert and oriented to person, place, and time.     Motor: No weakness.  Psychiatric:        Mood and Affect: Mood normal.        Behavior: Behavior normal.        Thought Content: Thought content normal.   Last CBC Lab Results  Component Value Date   WBC 7.2 08/12/2022   HGB 11.2 (L) 10/31/2022   HCT 33.0 (L) 10/31/2022   MCV 94.4 08/12/2022   MCH 29.8 08/12/2022   RDW 15.9 (H) 08/12/2022   PLT 292 08/12/2022   Last metabolic panel Lab Results  Component Value Date   GLUCOSE 105 (H) 10/31/2022   NA 140 10/31/2022   K 4.8 10/31/2022   CL 106 10/31/2022   CO2 23 08/14/2022   BUN 62 (H) 10/31/2022   CREATININE 1.90 (H) 10/31/2022   GFRNONAA 34 (L) 08/14/2022   CALCIUM 6.9 (L) 08/14/2022   PHOS 3.7 08/13/2022   PROT 6.2 (L) 08/11/2022   ALBUMIN 2.8 (L) 08/13/2022   BILITOT 0.7 08/11/2022   ALKPHOS 184 (H) 08/11/2022   AST 25 08/11/2022   ALT 28 08/11/2022   ANIONGAP 13 08/14/2022   Last lipids Lab Results  Component Value Date   CHOL 90 01/05/2019   HDL 30 (L) 01/05/2019   LDLCALC 44 01/05/2019   TRIG 116 01/07/2019   CHOLHDL 3.0 01/05/2019   Last hemoglobin A1c Lab Results  Component Value Date   HGBA1C 6.3 (H) 01/05/2019   Last thyroid functions Lab Results  Component  Value Date   TSH 4.746 (H) 11/11/2015   Last vitamin D Lab Results  Component Value Date   VD25OH 34.2 11/10/2015      Assessment & Plan:   Problem List Items Addressed This Visit       Essential hypertension - Primary    Remains adequately controlled with Toprol-XL 25 mg daily.  No medication changes are indicated today.      Permanent atrial fibrillation (HCC)    Irregularly irregular rate and rhythm detected on exam again today.  He remains on renally dosed Eliquis and Toprol-XL.  No medication changes are indicated today.  Recently seen by cardiology for follow-up.      Congestive heart failure (CHF) (HCC)    HFrEF.  EF 20-25% on TTE from March.  Remains euvolemic on exam.  He is prescribed Farxiga, Toprol-XL, and Lasix.  Not a candidate for ICD per cardiology.  No medication changes are indicated today.      CKD stage 3b, GFR 30-44 ml/min (HCC)    CKD 3B.  Repeat labs ordered today.  He remains on Farxiga 10 mg daily.      Prediabetes    Noted on previous labs.  Repeat A1c ordered today.  Currently prescribed Farxiga 10 mg daily.      Dyslipidemia    Currently prescribed pravastatin 20 mg daily.  Repeat lipid panel ordered today.      Need for influenza vaccination    Influenza vaccine administered today       Return in about 3 months (around 12/08/2023).    Billie Lade, MD

## 2023-09-08 LAB — HEMOGLOBIN A1C
Est. average glucose Bld gHb Est-mCnc: 126 mg/dL
Hgb A1c MFr Bld: 6 % — ABNORMAL HIGH (ref 4.8–5.6)

## 2023-09-08 LAB — BASIC METABOLIC PANEL
BUN/Creatinine Ratio: 21 (ref 10–24)
BUN: 42 mg/dL — ABNORMAL HIGH (ref 8–27)
CO2: 23 mmol/L (ref 20–29)
Calcium: 9.2 mg/dL (ref 8.6–10.2)
Chloride: 101 mmol/L (ref 96–106)
Creatinine, Ser: 2 mg/dL — ABNORMAL HIGH (ref 0.76–1.27)
Glucose: 166 mg/dL — ABNORMAL HIGH (ref 70–99)
Potassium: 4.6 mmol/L (ref 3.5–5.2)
Sodium: 142 mmol/L (ref 134–144)
eGFR: 32 mL/min/{1.73_m2} — ABNORMAL LOW (ref >59)

## 2023-09-08 LAB — LIPID PANEL
Chol/HDL Ratio: 2.6 ratio (ref 0.0–5.0)
Cholesterol, Total: 119 mg/dL (ref 100–199)
HDL: 45 mg/dL (ref >39)
LDL Chol Calc (NIH): 46 mg/dL (ref 0–99)
Triglycerides: 168 mg/dL — ABNORMAL HIGH (ref 0–149)
VLDL Cholesterol Cal: 28 mg/dL (ref 5–40)

## 2023-09-08 LAB — VITAMIN D 25 HYDROXY (VIT D DEFICIENCY, FRACTURES): Vit D, 25-Hydroxy: 50.4 ng/mL (ref 30.0–100.0)

## 2023-09-08 LAB — CBC
Hematocrit: 36 % — ABNORMAL LOW (ref 37.5–51.0)
Hemoglobin: 11.9 g/dL — ABNORMAL LOW (ref 13.0–17.7)
MCH: 32.6 pg (ref 26.6–33.0)
MCHC: 33.1 g/dL (ref 31.5–35.7)
MCV: 99 fL — ABNORMAL HIGH (ref 79–97)
Platelets: 251 10*3/uL (ref 150–450)
RBC: 3.65 x10E6/uL — ABNORMAL LOW (ref 4.14–5.80)
RDW: 13.5 % (ref 11.6–15.4)
WBC: 7.9 10*3/uL (ref 3.4–10.8)

## 2023-09-08 LAB — B12 AND FOLATE PANEL
Folate: 18.2 ng/mL (ref >3.0)
Vitamin B-12: 443 pg/mL (ref 232–1245)

## 2023-09-08 LAB — TSH+FREE T4
Free T4: 1.11 ng/dL (ref 0.82–1.77)
TSH: 4.46 u[IU]/mL (ref 0.450–4.500)

## 2023-09-10 ENCOUNTER — Telehealth: Payer: Self-pay

## 2023-09-10 NOTE — Telephone Encounter (Signed)
Refills sent to pharmacy. 

## 2023-09-10 NOTE — Telephone Encounter (Signed)
Copied from CRM [id number]. Topic: Clinical - Medication Question >> Sep 07, 2023  3:56 PM Deaijah H wrote: Reason for CRM: dapagliflozin propanediol (FARXIGA) 10 MG TABS tablet

## 2023-11-07 ENCOUNTER — Encounter: Payer: Self-pay | Admitting: Student

## 2023-11-07 ENCOUNTER — Ambulatory Visit: Payer: Medicare Other | Attending: Student | Admitting: Student

## 2023-11-07 VITALS — BP 128/64 | HR 52 | Ht 68.5 in | Wt 175.6 lb

## 2023-11-07 DIAGNOSIS — I4729 Other ventricular tachycardia: Secondary | ICD-10-CM | POA: Diagnosis not present

## 2023-11-07 DIAGNOSIS — I5022 Chronic systolic (congestive) heart failure: Secondary | ICD-10-CM

## 2023-11-07 DIAGNOSIS — N1832 Chronic kidney disease, stage 3b: Secondary | ICD-10-CM

## 2023-11-07 DIAGNOSIS — E785 Hyperlipidemia, unspecified: Secondary | ICD-10-CM | POA: Diagnosis not present

## 2023-11-07 DIAGNOSIS — I25810 Atherosclerosis of coronary artery bypass graft(s) without angina pectoris: Secondary | ICD-10-CM | POA: Diagnosis not present

## 2023-11-07 DIAGNOSIS — I4821 Permanent atrial fibrillation: Secondary | ICD-10-CM

## 2023-11-07 NOTE — Patient Instructions (Signed)
 Medication Instructions:  Your physician recommends that you continue on your current medications as directed. Please refer to the Current Medication list given to you today.   *If you need a refill on your cardiac medications before your next appointment, please call your pharmacy*   Lab Work: NONE   If you have labs (blood work) drawn today and your tests are completely normal, you will receive your results only by: MyChart Message (if you have MyChart) OR A paper copy in the mail If you have any lab test that is abnormal or we need to change your treatment, we will call you to review the results.   Testing/Procedures: NONE    Follow-Up: At [facility], you and your health needs are our priority.  As part of our continuing mission to provide you with exceptional heart care, we have created designated Provider Care Teams.  These Care Teams include your primary Cardiologist (physician) and Advanced Practice Providers (APPs -  Physician Assistants and Nurse Practitioners) who all work together to provide you with the care you need, when you need it.  We recommend signing up for the patient portal called "MyChart".  Sign up information is provided on this After Visit Summary.  MyChart is used to connect with patients for Virtual Visits (Telemedicine).  Patients are able to view lab/test results, encounter notes, upcoming appointments, etc.  Non-urgent messages can be sent to your provider as well.   To learn more about what you can do with MyChart, go to [email or web address].    Your next appointment:   6 month(s)  Provider:   You may see Dorothye Gathers, MD or one of the following Advanced Practice Providers on your designated Care Team:   Woodfin Hays, PA-C  Theotis Flake, PA-C     Other Instructions Thank you for choosing [facility]!

## 2023-11-07 NOTE — Progress Notes (Signed)
 Cardiology Office Note    Date:  11/07/2023  ID:  IANN CLINGERMAN, DOB May 03, 1936, MRN [id number] Cardiologist: Dorothye Gathers, MD   EP: Dr. Carolynne Citron  History of Present Illness:    [name] is a 88 y.o. male with past history of CAD (s/p CABG in 1990's, patent bypass grafts by catheterization in 2002, low-risk NST in 2008), chronic HFrEF (EF 20 to 25% by echocardiogram in 07/2022 and repeat cath not pursued given worsening renal function, EF 20 to 25% in 12/2022), permanent atrial fibrillation, HTN, HLD, Stage 3-4 CKD, history of GI bleed, PAD (s/p L AKA) and prior CVA who presents to the office today for 52-month follow-up.  He was examined by myself in 08/2023 and was mostly wheelchair-bound but denied any recent anginal symptoms. He was being followed by wound care given wounds along his right lower extremity but reported improvement in this. Options of repeat stress testing had previously been reviewed but he did not wish to pursue this as he did not wish to undergo repeat cardiac procedures and was overall felt to be too high risk for a cardiac catheterization in the setting of his advanced CKD. He was continued on his current medical therapy with Farxiga  10 mg daily, Lasix  40 mg daily, Toprol -XL 25 mg daily and Pravastatin  20 mg daily. He was not on ASA given the need for anticoagulation and was not on an ACE-I/ARB/ARNI/MRA given his Stage IV CKD.  In talking with the patient and his wife today, he reports things have overall been stable since his last office visit. He mostly uses a wheelchair for ambulation around the home but denies any specific chest pain or dyspnea on exertion. No recent orthopnea, PND or pitting edema. He is still being followed by home health for wounds along his right lower extremity and has a dressing in place today. He remains on Eliquis  for anticoagulation with no reports of active bleeding.  Studies Reviewed:   EKG: EKG is not ordered today.  Limited Echo:  12/2022 IMPRESSIONS     1. The distal apex is dyskinetic,mildly aneurysmal.. Left ventricular  ejection fraction, by estimation, is 20 to 25%. The left ventricle has  severely decreased function. The left ventricle demonstrates global  hypokinesis.   2. Right ventricular systolic function is normal. The right ventricular  size is normal.   3. Limited echo assess LV function    Risk Assessment/Calculations:    CHA2DS2-VASc Score = 7   This indicates a 11.2% annual risk of stroke. The patient's score is based upon: CHF History: 1 HTN History: 1 Diabetes History: 0 Stroke History: 2 Vascular Disease History: 1 Age Score: 2 Gender Score: 0   Physical Exam:   VS:  BP 128/64   Pulse (!) 52   Ht 5' 8.5" (1.74 m)   Wt 175 lb 9.6 oz (79.7 kg)   SpO2 100%   BMI 26.31 kg/m    Wt Readings from Last 3 Encounters:  11/07/23 175 lb 9.6 oz (79.7 kg)  08/31/23 174 lb (78.9 kg)  06/07/23 157 lb (71.2 kg)     GEN: Pleasant, elderly male appearing in no acute distress.  Sitting in wheelchair. NECK: No JVD; No carotid bruits CARDIAC: Irregular irregular, no murmurs, rubs, gallops RESPIRATORY:  Clear to auscultation without rales, wheezing or rhonchi  ABDOMEN: Appears non-distended. No obvious abdominal masses. EXTREMITIES: No clubbing or cyanosis.  Left AKA. Right lower extremity dressings in place.   Assessment and Plan:   1. CAD/HLD -  He underwent CABG in the 1990's and did have patent grafts by cardiac catheterization 2002. He has not been interested in further ischemic evaluation as he would not be a candidate for a cardiac catheterization given his high risk for contrast-induced nephropathy and has overall preferred a more conservative approach. - Thankfully, he continues to do well and denies any recent anginal symptoms. Continue current medical therapy with Toprol -XL 25 mg daily, Farxiga  10 mg daily and Pravastatin  20 mg daily (FLP in 08/2023 showed his LDL was at 46). He is  not on ASA given the need for anticoagulation.  2. Chronic HFrEF - Most recent echocardiogram in 12/2022 showed his EF remained reduced at 20 to 25%. He does have intermittent lower extremity edema along his right leg but this has improved with placement of dressings given a wound. No specific respiratory issues.   - Continue current medical therapy with Toprol -XL 25 mg daily, Farxiga  10 mg daily and Lasix  40 mg daily.  Labs in 08/2023 showed his creatinine was at 2.0 which is close to baseline. We discussed the possibility of adding Hydralazine  or Nitrates given that his BP does now allow for this and he is not interested in additional medications at this time as he reports taking 16 pills in the morning already. He has not been on an ACE-I/ARB/ARNI/MRA given his renal function.   3. Prior NSVT - He was previously evaluated by EP and felt to be a poor candidate for an ICD given his advanced age and comorbidities. Electrolytes were WNL when checked in 08/2023. Continue Toprol -XL 25 mg daily.  4. Permanent Atrial Fibrillation - He denies any recent palpitations and his heart rate is well-controlled in the 50's today. Remains on Toprol -XL 25 mg daily. - No reports of active bleeding. He is on Eliquis  2.5 mg twice daily for anticoagulation which is the appropriate dose given his age and renal function.  5. Stage 3- 4 CKD - Creatinine was at 2.00 in 08/2023 which is close to his known baseline. Remains on Farxiga  10 mg daily.   Signed, Dorma Gash, PA-C

## 2023-11-15 ENCOUNTER — Telehealth: Payer: Self-pay

## 2023-11-15 NOTE — Telephone Encounter (Signed)
Triage Call: Leeroy Bock, RN from [facility] called, stating she has been the one taking care of his unna boot dressing changes and his legs have overall done well even nearly healing the posterior calf wound.  She adds that someone else did change the dressing one week and she noticed mild widespread redness but changed it again today and noted worsening.  She states the leg is not warm, not painful, he is afebrile.  Wondering if we can take a look at it.   Appointment booked for tomorrow

## 2023-11-16 ENCOUNTER — Ambulatory Visit (INDEPENDENT_AMBULATORY_CARE_PROVIDER_SITE_OTHER): Payer: Medicare Other | Admitting: Physician Assistant

## 2023-11-16 VITALS — BP 178/83 | HR 68 | Temp 98.2°F | Resp 20 | Ht 68.5 in | Wt 175.0 lb

## 2023-11-16 DIAGNOSIS — L97909 Non-pressure chronic ulcer of unspecified part of unspecified lower leg with unspecified severity: Secondary | ICD-10-CM

## 2023-11-16 DIAGNOSIS — I739 Peripheral vascular disease, unspecified: Secondary | ICD-10-CM | POA: Diagnosis not present

## 2023-11-16 DIAGNOSIS — I83009 Varicose veins of unspecified lower extremity with ulcer of unspecified site: Secondary | ICD-10-CM | POA: Diagnosis not present

## 2023-11-16 MED ORDER — DOXYCYCLINE HYCLATE 100 MG PO CAPS: 100.0000 mg | ORAL_CAPSULE | Freq: Two times a day (BID) | ORAL | 0 refills | Status: DC

## 2023-11-16 NOTE — Progress Notes (Signed)
Office Note     CC:  follow up Requesting Provider:  Billie Lade, MD  HPI: [name] is a 88 y.o. ([birth year]) male who presents as a triage work in with lower extremity redness. He has history of right foot ulceration. He underwent diagnostic angiogram in January of 2024. This showed patency throughout with 2 vessel PT/ peroneal runoff. He was able to heal the toe wound but at time of his last visit in August he had developed a large posterior calf wound as well as blistering and weeping from the right leg. He was placed in an unna boot and encouraged to elevate his legs daily.   He is here today with his wife. They report that they have been getting weekly unna boot changes since his last visit. The wound has healed up significantly but still having a lot of weeping and also progressing redness. They report that the [facility] RN was worried he has cellulitis. He denies any pain, fevers or chills. He elevates nightly with a wedge but he admittedly keeps his leg down in a dependent position throughout most of the day.  He has limited mobility given his left above-the-knee amputation which was performed in 2017 by Dr. Edilia Bo. He also does not wear compression since he has had the unna boots. His wife repots that he does have several pairs at  home.  Past Medical History:  Diagnosis Date   Arteriosclerotic cardiovascular disease (ASCVD)    CABG in 1990s; 2002 total obstruction of LAD, CX and RCA with patent grafts and nl EF; Stress nuc. 2008 - mild LV dilation; normal EF; questionable small anteroapical scar; no ischemia   Arthritis    "left knee" (11/08/2015)   Atrial fibrillation (HCC)    Cellulitis 11/08/2015   "both feet"   Chronic anticoagulation    Dental crowns present    Diabetic peripheral neuropathy (HCC)    bilateral lower leg, hands   History of blood transfusion    "related to my leg surgery?"   Hypertension    states under control with meds., has been on med. x "long time"    Iron deficiency anemia    Jaw cancer (HCC) 1990s   "squamous cell"   Myocardial infarction (HCC) 1990s   "after my jaw OR"   Pedal edema    "not a lot", per pt.   Peripheral vascular disease (HCC)    Presence of retained hardware 07/2015   failed hardware jaw   Runny nose 07/27/2015   clear drainage, per pt.   Stroke (HCC)    Type II diabetes mellitus (HCC)    "diet controlled" (11/08/2015)    Past Surgical History:  Procedure Laterality Date   ABDOMINAL AORTOGRAM W/LOWER EXTREMITY Bilateral 08/20/2020   Procedure: ABDOMINAL AORTOGRAM W/LOWER EXTREMITY;  Surgeon: Chuck Hint, MD;  Location[facility] INVASIVE CV LAB;  Service: Cardiovascular;  Laterality: Bilateral;   ABDOMINAL AORTOGRAM W/LOWER EXTREMITY Right 10/31/2022   Procedure: ABDOMINAL AORTOGRAM W/LOWER EXTREMITY;  Surgeon: Nada Libman, MD;  Location: [facility] INVASIVE CV LAB;  Service: Cardiovascular;  Laterality: Right;   AMPUTATION Left 11/11/2015   Procedure: LEFT ABOVE KNEE AMPUTATION;  Surgeon: Chuck Hint, MD;  [facility];  Service: Vascular;  Laterality: Left;   CARDIAC CATHETERIZATION  1990s; 04/16/2001   CATARACT EXTRACTION W/ INTRAOCULAR LENS  IMPLANT, BILATERAL Bilateral 09/2015   COLONOSCOPY N/A 11/27/2014   Procedure: COLONOSCOPY;  Surgeon: Malissa Hippo, MD;  Location: [facility] ENDO SUITE;  Service: Endoscopy;  Laterality: N/A;  830   CORONARY ARTERY BYPASS GRAFT  1990's   "CABG X4"   ESOPHAGOGASTRODUODENOSCOPY N/A 02/14/2018   Procedure: ESOPHAGOGASTRODUODENOSCOPY (EGD);  Surgeon: Malissa Hippo, MD;  Location: [facility];  Service: Endoscopy;  Laterality: N/A;   ESOPHAGOGASTRODUODENOSCOPY (EGD) WITH PROPOFOL N/A 02/25/2018   Procedure: ESOPHAGOGASTRODUODENOSCOPY (EGD) WITH PROPOFOL;  Surgeon: Malissa Hippo, MD;  Location: [facility];  Service: Endoscopy;  Laterality: N/A;   FRACTURE SURGERY     GIVENS CAPSULE STUDY N/A 02/15/2018   Procedure: GIVENS CAPSULE STUDY;  Surgeon: Malissa Hippo, MD;  Location: [facility];  Service: Endoscopy;  Laterality: N/A;   GIVENS CAPSULE STUDY N/A 03/27/2018   Procedure: GIVENS CAPSULE STUDY;  Surgeon: Malissa Hippo, MD;  Location: [facility];  Service: Endoscopy;  Laterality: N/A;   I & D EXTREMITY Left 11/10/2015   Procedure: INCISION AND DRAINAGE LEFT FOOT, AMPUTATION OF LEFT THIRD TOE;  Surgeon: Fransisco Hertz, MD;  Location: [facility] OR;  Service: Vascular;  Laterality: Left;   INCISION AND DRAINAGE ABSCESS Left 06/16/2005   wide exc. abscess 5th toe   IR ANGIOGRAM VISCERAL SELECTIVE  03/28/2018   IR ANGIOGRAM VISCERAL SELECTIVE  03/28/2018   IR CT HEAD LTD  01/04/2019   IR PERCUTANEOUS ART THROMBECTOMY/INFUSION INTRACRANIAL INC DIAG ANGIO  01/04/2019   IR US GUIDE VASC ACCESS LEFT  03/28/2018   MANDIBULAR HARDWARE REMOVAL Left 08/02/2015   Procedure:  HARDWARE REMOVAL TWO MANDIBULAR SCREWS;  Surgeon: Felton Clinton, DDS;  Location: [facility];  Service: Oral Surgery;  Laterality: Left;   ORIF TIBIA FRACTURE Left ~ 1970   PERIPHERAL VASCULAR CATHETERIZATION N/A 08/14/2016   Procedure: Abdominal Aortogram w/Lower Extremity;  Surgeon: Chuck Hint, MD;  Location: [facility] INVASIVE CV LAB;  Service: Cardiovascular;  Laterality: N/A;   PERIPHERAL VASCULAR INTERVENTION Bilateral 08/20/2020   Procedure: PERIPHERAL VASCULAR INTERVENTION;  Surgeon: Chuck Hint, MD;  Location: [facility] INVASIVE CV LAB;  Service: Cardiovascular;  Laterality: Bilateral;  iliac stents   RADIOLOGY WITH ANESTHESIA N/A 01/04/2019   Procedure: CODE STROKE;  Surgeon: Radiologist, Medication, MD;  Location: [facility] OR;  Service: Radiology;  Laterality: N/A;   SQUAMOUS CELL CARCINOMA EXCISION Left 1993   "took my jaw out; got cadavar in there now"   TONSILLECTOMY      Social History   Socioeconomic History   Marital status: Married    Spouse name: Not on file   Number of children: Not on file   Years of education: Not on file   Highest education level: Not on  file  Occupational History   Not on file  Tobacco Use   Smoking status: Never    Passive exposure: Never   Smokeless tobacco: Never  Vaping Use   Vaping status: Never Used  Substance and Sexual Activity   Alcohol use: No    Alcohol/week: 0.0 standard drinks of alcohol   Drug use: No   Sexual activity: Yes  Other Topics Concern   Not on file  Social History Narrative   Not on file   Social Drivers of Health   Financial Resource Strain: Not on file  Food Insecurity: No Food Insecurity (08/11/2022)   Hunger Vital Sign    Worried About Running Out of Food in the Last Year: Never true    Ran Out of Food in the Last Year: Never true  Transportation Needs: No Transportation Needs (08/11/2022)   PRAPARE - Transportation    Lack of  Transportation (Medical): No    Lack of Transportation (Non-Medical): No  Physical Activity: Not on file  Stress: Not on file  Social Connections: Not on file  Intimate Partner Violence: Not At Risk (08/11/2022)   Humiliation, Afraid, Rape, and Kick questionnaire    Fear of Current or Ex-Partner: No    Emotionally Abused: No    Physically Abused: No    Sexually Abused: No    Family History  Problem Relation Age of Onset   Heart disease Mother     Current Outpatient Medications  Medication Sig Dispense Refill   acetaminophen (TYLENOL) 325 MG tablet Take 2 tablets (650 mg total) by mouth every 6 (six) hours as needed for moderate pain. (Patient taking differently: Take 1,000 mg by mouth every 6 (six) hours as needed for moderate pain (pain score 4-6).) 30 tablet 0   apixaban (ELIQUIS) 2.5 MG TABS tablet Take 1 tablet (2.5 mg total) by mouth 2 (two) times daily. 60 tablet 1   augmented betamethasone dipropionate (DIPROLENE-AF) 0.05 % cream Apply topically.     calcium-vitamin D (OSCAL WITH D) 500-5 MG-MCG tablet Take 2 tablets by mouth 2 (two) times daily. 60 tablet 2   carboxymethylcellul-glycerin (OPTIVE) 0.5-0.9 % ophthalmic solution Place 1 drop  into both eyes daily as needed for dry eyes.     cetirizine (ZYRTEC) 10 MG tablet Take 10 mg by mouth every morning.     cholecalciferol (VITAMIN D3) 25 MCG (1000 UNIT) tablet Take 2,000 Units by mouth daily.     dapagliflozin propanediol (FARXIGA) 10 MG TABS tablet Take 1 tablet (10 mg total) by mouth daily. 90 tablet 3   feeding supplement (BOOST / RESOURCE BREEZE) LIQD Take 1 Container by mouth 2 (two) times daily between meals. (Patient taking differently: Take 237 mLs by mouth 2 (two) times daily between meals. Ensue)     furosemide (LASIX) 40 MG tablet Take 1 tablet (40 mg total) by mouth daily. 30 tablet 3   magnesium oxide (MAG-OX) 400 (240 Mg) MG tablet Take 1 tablet (400 mg total) by mouth daily. 30 tablet 3   metoprolol succinate (TOPROL XL) 25 MG 24 hr tablet Take 1 tablet (25 mg total) by mouth daily. 90 tablet 3   Omega-3 Fatty Acids (FISH OIL) 1200 MG CAPS Take 1,200 mg by mouth every morning.     Pediatric Multivitamins-Iron (FLINTSTONES PLUS IRON PO) Take 1 tablet by mouth daily.     potassium chloride SA (KLOR-CON M) 20 MEQ tablet Take 2 tablets (40 mEq total) by mouth daily. 60 tablet 3   pravastatin (PRAVACHOL) 20 MG tablet Take 1 tablet (20 mg total) by mouth every evening. 30 tablet 3   Probiotic Product (PHILLIPS COLON HEALTH PO) Take 1 capsule by mouth daily.      silver sulfADIAZINE (SILVADENE) 1 % cream Apply 1 application topically daily. (Patient taking differently: Apply 1 application  topically daily. Between toes)     sodium bicarbonate 650 MG tablet Take 1,300 mg by mouth 2 (two) times daily.     No current facility-administered medications for this visit.    Allergies  Allergen Reactions   Lipitor [Atorvastatin] Other (See Comments)    SEVERE HEADACHE   Simvastatin Other (See Comments)    MUSCLE ACHES   Cefuroxime Diarrhea   Penicillins Rash    Has patient had a PCN reaction causing immediate rash, facial/tongue/throat swelling, SOB or lightheadedness with  hypotension: Yes Has patient had a PCN reaction causing severe rash involving mucus  membranes or skin necrosis: No Has patient had a PCN reaction that required hospitalization No Has patient had a PCN reaction occurring within the last 10 years: No If all of the above answers are "NO", then may proceed with Cephalosporin use.    Sulfa Antibiotics Rash    Tolerates silver sulfadiazine cream at home     REVIEW OF SYSTEMS:  [X]  denotes positive finding, [ ]  denotes negative finding Cardiac  Comments:  Chest pain or chest pressure:    Shortness of breath upon exertion:    Short of breath when lying flat:    Irregular heart rhythm:        Vascular    Pain in calf, thigh, or hip brought on by ambulation:    Pain in feet at night that wakes you up from your sleep:     Blood clot in your veins:    Leg swelling:  X       Pulmonary    Oxygen at home:    Productive cough:     Wheezing:         Neurologic    Sudden weakness in arms or legs:     Sudden numbness in arms or legs:     Sudden onset of difficulty speaking or slurred speech:    Temporary loss of vision in one eye:     Problems with dizziness:         Gastrointestinal    Blood in stool:     Vomited blood:         Genitourinary    Burning when urinating:     Blood in urine:        Psychiatric    Major depression:         Hematologic    Bleeding problems:    Problems with blood clotting too easily:        Skin    Rashes or ulcers:        Constitutional    Fever or chills:      PHYSICAL EXAMINATION:  Vitals:   11/16/23 1356  BP: (!) 178/83  Pulse: 68  Resp: 20  Temp: 98.2 F (36.8 C)  TempSrc: Temporal  SpO2: 97%  Weight: 175 lb (79.4 kg)  Height: 5' 8.5" (1.74 m)    General:  WDWN in NAD; vital signs documented above Gait: Not observed, wheel chair HENT: WNL, normocephalic Pulmonary: normal non-labored breathing , without wheezing Cardiac: regular HR Vascular Exam/Pulses: Brisk doppler PT and  peroneal signals Extremities: without ischemic changes, without Gangrene , with some erythema extending up to the level of the knee; with superficial open wound on anterior leg and two small areas posteriorly. Leg was weeping a lot of serous drainage.Xeroform placed on open areas followed by dry gauze       Musculoskeletal: no muscle wasting or atrophy  Neurologic: A&O X 3 Psychiatric:  The pt has Normal affect.   ASSESSMENT/PLAN:: 88 y.o. male here as a triage work in with lower extremity redness. He has history of right foot ulceration. He underwent diagnostic angiogram in January of 2024. This showed patency throughout with 2 vessel PT/ peroneal runoff. He had resolution of the right toe wound. He subsequently presented with an ulcer on the posterior right leg and has been receiving weekly unna boot changes since August. The wound has improved significantly and is almost healed. He however has what appears to be venous dermatitis/ cellulitis. I suspect this is from just having  his leg  covered by a dressing for months on end. I have recommended taking a break from the unna boots and allowing let to get air. His wife will assist with cleaning and dressing the wounds daily. His RLE remains well perfused with doppler PT/Pero signals.  - Prescription for Doxycycline 100 mg BID x 7 days sent to patients pharmacy for cellulitis - Has [facility] RN with [facility] and will give them instructions to stop the weekly unna boots for now and just do wound checks. Advised with to clean leg and wounds daily, can leave open to air and then apply xeroform and dry gauze as needed to sleep or if leaving house - he will follow up in 3 weeks for wound check   Graceann Congress, PA-C [facility] [phone]  On call MD:   Hetty Blend

## 2023-11-21 ENCOUNTER — Telehealth: Payer: Self-pay

## 2023-11-21 NOTE — Telephone Encounter (Signed)
Clarification of wound care for [facility] RN per recent office visit

## 2023-12-07 ENCOUNTER — Ambulatory Visit (INDEPENDENT_AMBULATORY_CARE_PROVIDER_SITE_OTHER): Payer: Medicare Other | Admitting: Physician Assistant

## 2023-12-07 VITALS — BP 156/76 | HR 68 | Temp 98.0°F | Ht 68.0 in | Wt 174.0 lb

## 2023-12-07 DIAGNOSIS — I83009 Varicose veins of unspecified lower extremity with ulcer of unspecified site: Secondary | ICD-10-CM | POA: Diagnosis not present

## 2023-12-07 DIAGNOSIS — L97909 Non-pressure chronic ulcer of unspecified part of unspecified lower leg with unspecified severity: Secondary | ICD-10-CM | POA: Diagnosis not present

## 2023-12-07 DIAGNOSIS — I739 Peripheral vascular disease, unspecified: Secondary | ICD-10-CM | POA: Diagnosis not present

## 2023-12-07 DIAGNOSIS — I872 Venous insufficiency (chronic) (peripheral): Secondary | ICD-10-CM | POA: Diagnosis not present

## 2023-12-07 NOTE — Progress Notes (Signed)
Office Note     CC:  follow up Requesting Provider:  Billie Lade, MD  HPI: [name] is a 88 y.o. (1936/08/20) male who presents for follow up wound check. He was last seen 3 weeks ago for evaluation of possible cellulitis. He was given Doxycycline course, which he completed. He denies any pain in right leg. He has been having some intermittent pain in right lateral foot near 5th toe. He was getting weekly unna boot changes with [facility]. They have since been going out to check his wounds every Thursday. He reports overall leg is looking better. Wife has also been helping with dressing changes. Using xeroform on the ulcerative areas and gauze wraps. Has new wound on toe. Says it just started bleeding this morning. Unsure if he injured toe but does not recall hitting it.  Denies any fever or chills.  Past Medical History:  Diagnosis Date   Arteriosclerotic cardiovascular disease (ASCVD)    CABG in 1990s; 2002 total obstruction of LAD, CX and RCA with patent grafts and nl EF; Stress nuc. 2008 - mild LV dilation; normal EF; questionable small anteroapical scar; no ischemia   Arthritis    "left knee" (11/08/2015)   Atrial fibrillation (HCC)    Cellulitis 11/08/2015   "both feet"   Chronic anticoagulation    Dental crowns present    Diabetic peripheral neuropathy (HCC)    bilateral lower leg, hands   History of blood transfusion    "related to my leg surgery?"   Hypertension    states under control with meds., has been on med. x "long time"   Iron deficiency anemia    Jaw cancer (HCC) 1990s   "squamous cell"   Myocardial infarction (HCC) 1990s   "after my jaw OR"   Pedal edema    "not a lot", per pt.   Peripheral vascular disease (HCC)    Presence of retained hardware 07/2015   failed hardware jaw   Runny nose 07/27/2015   clear drainage, per pt.   Stroke (HCC)    Type II diabetes mellitus (HCC)    "diet controlled" (11/08/2015)    Past Surgical History:  Procedure  Laterality Date   ABDOMINAL AORTOGRAM W/LOWER EXTREMITY Bilateral 08/20/2020   Procedure: ABDOMINAL AORTOGRAM W/LOWER EXTREMITY;  Surgeon: Chuck Hint, MD;  Location: [facility] INVASIVE CV LAB;  Service: Cardiovascular;  Laterality: Bilateral;   ABDOMINAL AORTOGRAM W/LOWER EXTREMITY Right 10/31/2022   Procedure: ABDOMINAL AORTOGRAM W/LOWER EXTREMITY;  Surgeon: Nada Libman, MD;  Location: [facility] INVASIVE CV LAB;  Service: Cardiovascular;  Laterality: Right;   AMPUTATION Left 11/11/2015   Procedure: LEFT ABOVE KNEE AMPUTATION;  Surgeon: Chuck Hint, MD;  Location: [facility] OR;  Service: Vascular;  Laterality: Left;   CARDIAC CATHETERIZATION  1990s; 04/16/2001   CATARACT EXTRACTION W/ INTRAOCULAR LENS  IMPLANT, BILATERAL Bilateral 09/2015   COLONOSCOPY N/A 11/27/2014   Procedure: COLONOSCOPY;  Surgeon: Malissa Hippo, MD;  [facility];  Service: Endoscopy;  Laterality: N/A;  830   CORONARY ARTERY BYPASS GRAFT  1990's   "CABG X4"   ESOPHAGOGASTRODUODENOSCOPY N/A 02/14/2018   Procedure: ESOPHAGOGASTRODUODENOSCOPY (EGD);  Surgeon: Malissa Hippo, MD;  [facility]: [facility];  Service: Endoscopy;  Laterality: N/A;   ESOPHAGOGASTRODUODENOSCOPY (EGD) WITH PROPOFOL N/A 02/25/2018   Procedure: ESOPHAGOGASTRODUODENOSCOPY (EGD) WITH PROPOFOL;  Surgeon: Malissa Hippo, MD;  [facility]: [facility];  Service: Endoscopy;  Laterality: N/A;   FRACTURE SURGERY     GIVENS CAPSULE STUDY  N/A 02/15/2018   Procedure: GIVENS CAPSULE STUDY;  Surgeon: Malissa Hippo, MD;  [facility]: [facility];  Service: Endoscopy;  Laterality: N/A;   GIVENS CAPSULE STUDY N/A 03/27/2018   Procedure: GIVENS CAPSULE STUDY;  Surgeon: Malissa Hippo, MD;  [facility]: [facility];  Service: Endoscopy;  Laterality: N/A;   I & D EXTREMITY Left 11/10/2015   Procedure: INCISION AND DRAINAGE LEFT FOOT, AMPUTATION OF LEFT THIRD TOE;  Surgeon: Fransisco Hertz, MD;  Location: [facility] OR;  Service: Vascular;  Laterality: Left;   INCISION  AND DRAINAGE ABSCESS Left 06/16/2005   wide exc. abscess 5th toe   IR ANGIOGRAM VISCERAL SELECTIVE  03/28/2018   IR ANGIOGRAM VISCERAL SELECTIVE  03/28/2018   IR CT HEAD LTD  01/04/2019   IR PERCUTANEOUS ART THROMBECTOMY/INFUSION INTRACRANIAL INC DIAG ANGIO  01/04/2019   IR US GUIDE VASC ACCESS LEFT  03/28/2018   MANDIBULAR HARDWARE REMOVAL Left 08/02/2015   Procedure:  HARDWARE REMOVAL TWO MANDIBULAR SCREWS;  Surgeon: Felton Clinton, DDS;  Location:  SURGERY CENTER;  Service: Oral Surgery;  Laterality: Left;   ORIF TIBIA FRACTURE Left ~ 1970   PERIPHERAL VASCULAR CATHETERIZATION N/A 08/14/2016   Procedure: Abdominal Aortogram w/Lower Extremity;  Surgeon: Chuck Hint, MD;  Location: [facility] INVASIVE CV LAB;  Service: Cardiovascular;  Laterality: N/A;   PERIPHERAL VASCULAR INTERVENTION Bilateral 08/20/2020   Procedure: PERIPHERAL VASCULAR INTERVENTION;  Surgeon: Chuck Hint, MD;  Location: [facility] INVASIVE CV LAB;  Service: Cardiovascular;  Laterality: Bilateral;  iliac stents   RADIOLOGY WITH ANESTHESIA N/A 01/04/2019   Procedure: CODE STROKE;  Surgeon: Radiologist, Medication, MD;  Location: [facility] OR;  Service: Radiology;  Laterality: N/A;   SQUAMOUS CELL CARCINOMA EXCISION Left 1993   "took my jaw out; got cadavar in there now"   TONSILLECTOMY      Social History   Socioeconomic History   Marital status: Married    Spouse name: Not on file   Number of children: Not on file   Years of education: Not on file   Highest education level: Not on file  Occupational History   Not on file  Tobacco Use   Smoking status: Never    Passive exposure: Never   Smokeless tobacco: Never  Vaping Use   Vaping status: Never Used  Substance and Sexual Activity   Alcohol use: No    Alcohol/week: 0.0 standard drinks of alcohol   Drug use: No   Sexual activity: Yes  Other Topics Concern   Not on file  Social History Narrative   Not on file   Social Drivers of Health   Financial  Resource Strain: Not on file  Food Insecurity: No Food Insecurity (08/11/2022)   Hunger Vital Sign    Worried About Running Out of Food in the Last Year: Never true    Ran Out of Food in the Last Year: Never true  Transportation Needs: No Transportation Needs (08/11/2022)   PRAPARE - Administrator, Civil Service (Medical): No    Lack of Transportation (Non-Medical): No  Physical Activity: Not on file  Stress: Not on file  Social Connections: Not on file  Intimate Partner Violence: Not At Risk (08/11/2022)   Humiliation, Afraid, Rape, and Kick questionnaire    Fear of Current or Ex-Partner: No    Emotionally Abused: No    Physically Abused: No    Sexually Abused: No    Family History  Problem Relation Age of Onset   Heart disease  Mother     Current Outpatient Medications  Medication Sig Dispense Refill   acetaminophen (TYLENOL) 325 MG tablet Take 2 tablets (650 mg total) by mouth every 6 (six) hours as needed for moderate pain. (Patient taking differently: Take 1,000 mg by mouth every 6 (six) hours as needed for moderate pain (pain score 4-6).) 30 tablet 0   apixaban (ELIQUIS) 2.5 MG TABS tablet Take 1 tablet (2.5 mg total) by mouth 2 (two) times daily. 60 tablet 1   augmented betamethasone dipropionate (DIPROLENE-AF) 0.05 % cream Apply topically.     calcium-vitamin D (OSCAL WITH D) 500-5 MG-MCG tablet Take 2 tablets by mouth 2 (two) times daily. 60 tablet 2   carboxymethylcellul-glycerin (OPTIVE) 0.5-0.9 % ophthalmic solution Place 1 drop into both eyes daily as needed for dry eyes.     cetirizine (ZYRTEC) 10 MG tablet Take 10 mg by mouth every morning.     cholecalciferol (VITAMIN D3) 25 MCG (1000 UNIT) tablet Take 2,000 Units by mouth daily.     dapagliflozin propanediol (FARXIGA) 10 MG TABS tablet Take 1 tablet (10 mg total) by mouth daily. 90 tablet 3   doxycycline (VIBRAMYCIN) 100 MG capsule Take 1 capsule (100 mg total) by mouth 2 (two) times daily. 14 capsule 0    feeding supplement (BOOST / RESOURCE BREEZE) LIQD Take 1 Container by mouth 2 (two) times daily between meals. (Patient taking differently: Take 237 mLs by mouth 2 (two) times daily between meals. Ensue)     furosemide (LASIX) 40 MG tablet Take 1 tablet (40 mg total) by mouth daily. 30 tablet 3   magnesium oxide (MAG-OX) 400 (240 Mg) MG tablet Take 1 tablet (400 mg total) by mouth daily. 30 tablet 3   metoprolol succinate (TOPROL XL) 25 MG 24 hr tablet Take 1 tablet (25 mg total) by mouth daily. 90 tablet 3   Omega-3 Fatty Acids (FISH OIL) 1200 MG CAPS Take 1,200 mg by mouth every morning.     Pediatric Multivitamins-Iron (FLINTSTONES PLUS IRON PO) Take 1 tablet by mouth daily.     potassium chloride SA (KLOR-CON M) 20 MEQ tablet Take 2 tablets (40 mEq total) by mouth daily. 60 tablet 3   pravastatin (PRAVACHOL) 20 MG tablet Take 1 tablet (20 mg total) by mouth every evening. 30 tablet 3   Probiotic Product (PHILLIPS COLON HEALTH PO) Take 1 capsule by mouth daily.      silver sulfADIAZINE (SILVADENE) 1 % cream Apply 1 application topically daily. (Patient taking differently: Apply 1 application  topically daily. Between toes)     sodium bicarbonate 650 MG tablet Take 1,300 mg by mouth 2 (two) times daily.     No current facility-administered medications for this visit.    Allergies  Allergen Reactions   Lipitor [Atorvastatin] Other (See Comments)    SEVERE HEADACHE   Simvastatin Other (See Comments)    MUSCLE ACHES   Cefuroxime Diarrhea   Penicillins Rash    Has patient had a PCN reaction causing immediate rash, facial/tongue/throat swelling, SOB or lightheadedness with hypotension: Yes Has patient had a PCN reaction causing severe rash involving mucus membranes or skin necrosis: No Has patient had a PCN reaction that required hospitalization No Has patient had a PCN reaction occurring within the last 10 years: No If all of the above answers are "NO", then may proceed with Cephalosporin  use.    Sulfa Antibiotics Rash    Tolerates silver sulfadiazine cream at home     REVIEW OF SYSTEMS:   [  X] denotes positive finding, [ ]  denotes negative finding Cardiac  Comments:  Chest pain or chest pressure:    Shortness of breath upon exertion:    Short of breath when lying flat:    Irregular heart rhythm:        Vascular    Pain in calf, thigh, or hip brought on by ambulation:    Pain in feet at night that wakes you up from your sleep:     Blood clot in your veins:    Leg swelling:         Pulmonary    Oxygen at home:    Productive cough:     Wheezing:         Neurologic    Sudden weakness in arms or legs:     Sudden numbness in arms or legs:     Sudden onset of difficulty speaking or slurred speech:    Temporary loss of vision in one eye:     Problems with dizziness:         Gastrointestinal    Blood in stool:     Vomited blood:         Genitourinary    Burning when urinating:     Blood in urine:        Psychiatric    Major depression:         Hematologic    Bleeding problems:    Problems with blood clotting too easily:        Skin    Rashes or ulcers:        Constitutional    Fever or chills:      PHYSICAL EXAMINATION:  Vitals:   12/07/23 0913  BP: (!) 156/76  Pulse: 68  Temp: 98 F (36.7 C)  SpO2: 95%  Weight: 174 lb (78.9 kg)  Height: 5\' 8"  (1.727 m)    General:  WDWN in NAD; vital signs documented above Gait: Not observed, in wheel chair HENT: WNL, normocephalic Pulmonary: normal non-labored breathing , without Rales, rhonchi,  wheezing Cardiac: regular HR Vascular Exam/Pulses: Right foot warm, no palpable distal pulses Extremities: without ischemic changes, without Gangrene , without cellulitis; with open wound on anterior right leg, lateral and posterior. Small 1 cm wounds with SS drainage. Very superficial abrasion on dorsal great toe as shown below. Right leg very erythematous but does not appear to have cellulitis looks more  just like dermatitis     Musculoskeletal: no muscle wasting or atrophy  Neurologic: A&O X 3 Psychiatric:  The pt has Normal affect.  ASSESSMENT/PLAN:: 88 y.o. male here for follow up for RLE wounds. He has mixed arterial and venous disease. Has PT/Pero runoff but ABIs are very dampened. Right leg looks better with unna boot off, however leg is still very erythematous. He completed course of Doxycycline with no improvement. Likely more venous dermatitis.  - Discussed with Dr. Hetty Blend. Recommend continued current wound care. Gave instructions to clean leg and wounds daily, can leave open to air and then apply xeroform and light ACE as needed to keep xeroform in place - Continue daily leg elevation  - Keep foot protected - Patient and his wife would like to follow up in [place] due to close proximity to their house and he is in wheel chair so would be much easier for them - Follow up in 2-3 weeks for wound check in Jacqulyn Bath, PA-C [facility] [phone]  Clinic MD:   Hetty Blend

## 2023-12-14 ENCOUNTER — Encounter: Payer: Self-pay | Admitting: Internal Medicine

## 2023-12-14 ENCOUNTER — Ambulatory Visit: Payer: Medicare Other | Admitting: Internal Medicine

## 2023-12-14 VITALS — BP 134/68 | HR 96

## 2023-12-14 DIAGNOSIS — I10 Essential (primary) hypertension: Secondary | ICD-10-CM | POA: Diagnosis not present

## 2023-12-14 DIAGNOSIS — I5030 Unspecified diastolic (congestive) heart failure: Secondary | ICD-10-CM | POA: Diagnosis not present

## 2023-12-14 DIAGNOSIS — I4821 Permanent atrial fibrillation: Secondary | ICD-10-CM | POA: Diagnosis not present

## 2023-12-14 DIAGNOSIS — I739 Peripheral vascular disease, unspecified: Secondary | ICD-10-CM

## 2023-12-14 DIAGNOSIS — N1832 Chronic kidney disease, stage 3b: Secondary | ICD-10-CM

## 2023-12-14 DIAGNOSIS — E1122 Type 2 diabetes mellitus with diabetic chronic kidney disease: Secondary | ICD-10-CM

## 2023-12-14 DIAGNOSIS — Z7984 Long term (current) use of oral hypoglycemic drugs: Secondary | ICD-10-CM

## 2023-12-14 DIAGNOSIS — E785 Hyperlipidemia, unspecified: Secondary | ICD-10-CM

## 2023-12-14 MED ORDER — APIXABAN 2.5 MG PO TABS: 2.5000 mg | ORAL_TABLET | Freq: Two times a day (BID) | ORAL | 3 refills | Status: AC

## 2023-12-14 NOTE — Progress Notes (Signed)
Established Patient Office Visit  Subjective   Patient ID: [name], male    DOB: [birth year]  Age: 88 y.o. MRN: [id number]  Chief Complaint  Patient presents with   Hypertension    Three month follow up    [name] returns to care today for routine follow-up.  He was last evaluated by me in November 2024.  No medication changes were made at that time and 21-month follow-up was arranged.  In the interim, he has been evaluated by cardiology and vascular surgery.  Most recently seen by vascular surgery on 2/14 for follow-up in the setting of possible cellulitis of the right lower leg.  [name] reports feeling well today.  His leg is healing nicely.  He does not have any acute concerns to discuss.  Past Medical History:  Diagnosis Date   Arteriosclerotic cardiovascular disease (ASCVD)    CABG in 1990s; 2002 total obstruction of LAD, CX and RCA with patent grafts and nl EF; Stress nuc. 2008 - mild LV dilation; normal EF; questionable small anteroapical scar; no ischemia   Arthritis    "left knee" (11/08/2015)   Atrial fibrillation (HCC)    Cellulitis 11/08/2015   "both feet"   Chronic anticoagulation    Dental crowns present    Diabetic peripheral neuropathy (HCC)    bilateral lower leg, hands   History of blood transfusion    "related to my leg surgery?"   Hypertension    states under control with meds., has been on med. x "long time"   Iron deficiency anemia    Jaw cancer (HCC) 1990s   "squamous cell"   Myocardial infarction (HCC) 1990s   "after my jaw OR"   Pedal edema    "not a lot", per pt.   Peripheral vascular disease (HCC)    Presence of retained hardware 07/2015   failed hardware jaw   Runny nose 07/27/2015   clear drainage, per pt.   Stroke (HCC)    Type II diabetes mellitus (HCC)    "diet controlled" (11/08/2015)   Past Surgical History:  Procedure Laterality Date   ABDOMINAL AORTOGRAM W/LOWER EXTREMITY Bilateral 08/20/2020   Procedure: ABDOMINAL  AORTOGRAM W/LOWER EXTREMITY;  Surgeon: Chuck Hint, MD;  Location: [facility] INVASIVE CV LAB;  Service: Cardiovascular;  Laterality: Bilateral;   ABDOMINAL AORTOGRAM W/LOWER EXTREMITY Right 10/31/2022   Procedure: ABDOMINAL AORTOGRAM W/LOWER EXTREMITY;  Surgeon: Nada Libman, MD;  Location: [facility] INVASIVE CV LAB;  Service: Cardiovascular;  Laterality: Right;   AMPUTATION Left 11/11/2015   Procedure: LEFT ABOVE KNEE AMPUTATION;  Surgeon: Chuck Hint, MD;  Location: [facility] OR;  Service: Vascular;  Laterality: Left;   CARDIAC CATHETERIZATION  1990s; 04/16/2001   CATARACT EXTRACTION W/ INTRAOCULAR LENS  IMPLANT, BILATERAL Bilateral 09/2015   COLONOSCOPY N/A 11/27/2014   Procedure: COLONOSCOPY;  Surgeon: Malissa Hippo, MD;  [facility];  Service: Endoscopy;  Laterality: N/A;  830   CORONARY ARTERY BYPASS GRAFT  1990's   "CABG X4"   ESOPHAGOGASTRODUODENOSCOPY N/A 02/14/2018   Procedure: ESOPHAGOGASTRODUODENOSCOPY (EGD);  Surgeon: Malissa Hippo, MD;  [facility];  Service: Endoscopy;  Laterality: N/A;   ESOPHAGOGASTRODUODENOSCOPY (EGD) WITH PROPOFOL N/A 02/25/2018   Procedure: ESOPHAGOGASTRODUODENOSCOPY (EGD) WITH PROPOFOL;  Surgeon: Malissa Hippo, MD;  [facility];  Service: Endoscopy;  Laterality: N/A;   FRACTURE SURGERY     GIVENS CAPSULE STUDY N/A 02/15/2018   Procedure: GIVENS CAPSULE STUDY;  Surgeon: Malissa Hippo, MD;  [facility];  Service: Endoscopy;  Laterality: N/A;   GIVENS CAPSULE STUDY N/A 03/27/2018   Procedure: GIVENS CAPSULE STUDY;  Surgeon: Malissa Hippo, MD;  [facility];  Service: Endoscopy;  Laterality: N/A;   I & D EXTREMITY Left 11/10/2015   Procedure: INCISION AND DRAINAGE LEFT FOOT, AMPUTATION OF LEFT THIRD TOE;  Surgeon: Fransisco Hertz, MD;  Location: [facility] OR;  Service: Vascular;  Laterality: Left;   INCISION AND DRAINAGE ABSCESS Left 06/16/2005   wide exc. abscess 5th toe   IR ANGIOGRAM VISCERAL SELECTIVE   03/28/2018   IR ANGIOGRAM VISCERAL SELECTIVE  03/28/2018   IR CT HEAD LTD  01/04/2019   IR PERCUTANEOUS ART THROMBECTOMY/INFUSION INTRACRANIAL INC DIAG ANGIO  01/04/2019   IR US GUIDE VASC ACCESS LEFT  03/28/2018   MANDIBULAR HARDWARE REMOVAL Left 08/02/2015   Procedure:  HARDWARE REMOVAL TWO MANDIBULAR SCREWS;  Surgeon: Felton Clinton, DDS;  Location: [facility];  Service: Oral Surgery;  Laterality: Left;   ORIF TIBIA FRACTURE Left ~ 1970   PERIPHERAL VASCULAR CATHETERIZATION N/A 08/14/2016   Procedure: Abdominal Aortogram w/Lower Extremity;  Surgeon: Chuck Hint, MD;  Location: [facility] INVASIVE CV LAB;  Service: Cardiovascular;  Laterality: N/A;   PERIPHERAL VASCULAR INTERVENTION Bilateral 08/20/2020   Procedure: PERIPHERAL VASCULAR INTERVENTION;  Surgeon: Chuck Hint, MD;  Location: [facility] INVASIVE CV LAB;  Service: Cardiovascular;  Laterality: Bilateral;  iliac stents   RADIOLOGY WITH ANESTHESIA N/A 01/04/2019   Procedure: CODE STROKE;  Surgeon: Radiologist, Medication, MD;  Location: [facility] OR;  Service: Radiology;  Laterality: N/A;   SQUAMOUS CELL CARCINOMA EXCISION Left 1993   "took my jaw out; got cadavar in there now"   TONSILLECTOMY     Social History   Tobacco Use   Smoking status: Never    Passive exposure: Never   Smokeless tobacco: Never  Vaping Use   Vaping status: Never Used  Substance Use Topics   Alcohol use: No    Alcohol/week: 0.0 standard drinks of alcohol   Drug use: No   Family History  Problem Relation Age of Onset   Heart disease Mother    Allergies  Allergen Reactions   Lipitor [Atorvastatin] Other (See Comments)    SEVERE HEADACHE   Simvastatin Other (See Comments)    MUSCLE ACHES   Cefuroxime Diarrhea   Penicillins Rash    Has patient had a PCN reaction causing immediate rash, facial/tongue/throat swelling, SOB or lightheadedness with hypotension: Yes Has patient had a PCN reaction causing severe rash involving mucus membranes or skin  necrosis: No Has patient had a PCN reaction that required hospitalization No Has patient had a PCN reaction occurring within the last 10 years: No If all of the above answers are "NO", then may proceed with Cephalosporin use.    Sulfa Antibiotics Rash    Tolerates silver sulfadiazine cream at home   Review of Systems  Constitutional:  Negative for chills and fever.  HENT:  Negative for sore throat.   Respiratory:  Negative for cough and shortness of breath.   Cardiovascular:  Negative for chest pain, palpitations and leg swelling.  Gastrointestinal:  Negative for abdominal pain, blood in stool, constipation, diarrhea, nausea and vomiting.  Genitourinary:  Negative for dysuria and hematuria.  Musculoskeletal:  Negative for myalgias.  Skin:  Negative for itching and rash.  Neurological:  Negative for dizziness and headaches.  Psychiatric/Behavioral:  Negative for depression and suicidal ideas.      Objective:  BP 134/68   Pulse 96   SpO2 97%  BP Readings from Last 3 Encounters:  12/14/23 134/68  12/07/23 (!) 156/76  11/16/23 (!) 178/83   Physical Exam Vitals reviewed.  Constitutional:      General: He is not in acute distress.    Appearance: Normal appearance. He is not ill-appearing.     Comments: Examined in wheelchair  HENT:     Head: Normocephalic and atraumatic.     Right Ear: External ear normal.     Left Ear: External ear normal.     Nose: Nose normal. No congestion or rhinorrhea.     Mouth/Throat:     Mouth: Mucous membranes are moist.     Pharynx: Oropharynx is clear.  Eyes:     General: No scleral icterus.    Extraocular Movements: Extraocular movements intact.     Conjunctiva/sclera: Conjunctivae normal.     Pupils: Pupils are equal, round, and reactive to light.  Cardiovascular:     Rate and Rhythm: Normal rate. Rhythm irregular.     Pulses: Normal pulses.     Heart sounds: Normal heart sounds. No murmur heard. Pulmonary:     Effort: Pulmonary  effort is normal.     Breath sounds: Normal breath sounds. No wheezing, rhonchi or rales.  Abdominal:     General: Abdomen is flat. Bowel sounds are normal. There is no distension.     Palpations: Abdomen is soft.     Tenderness: There is no abdominal tenderness.  Musculoskeletal:        General: No swelling or deformity.     Cervical back: Normal range of motion.     Comments: Left AKA  Skin:    General: Skin is warm and dry.     Capillary Refill: Capillary refill takes less than 2 seconds.     Comments: Clean, dry, intact dressing on right lower leg  Neurological:     General: No focal deficit present.     Mental Status: He is alert and oriented to person, place, and time.     Motor: No weakness.  Psychiatric:        Mood and Affect: Mood normal.        Behavior: Behavior normal.        Thought Content: Thought content normal.   Last CBC Lab Results  Component Value Date   WBC 7.9 09/07/2023   HGB 11.9 (L) 09/07/2023   HCT 36.0 (L) 09/07/2023   MCV 99 (H) 09/07/2023   MCH 32.6 09/07/2023   RDW 13.5 09/07/2023   PLT 251 09/07/2023   Last metabolic panel Lab Results  Component Value Date   GLUCOSE 166 (H) 09/07/2023   NA 142 09/07/2023   K 4.6 09/07/2023   CL 101 09/07/2023   CO2 23 09/07/2023   BUN 42 (H) 09/07/2023   CREATININE 2.00 (H) 09/07/2023   EGFR 32 (L) 09/07/2023   CALCIUM 9.2 09/07/2023   PHOS 3.7 08/13/2022   PROT 6.2 (L) 08/11/2022   ALBUMIN 2.8 (L) 08/13/2022   BILITOT 0.7 08/11/2022   ALKPHOS 184 (H) 08/11/2022   AST 25 08/11/2022   ALT 28 08/11/2022   ANIONGAP 13 08/14/2022   Last lipids Lab Results  Component Value Date   CHOL 119 09/07/2023   HDL 45 09/07/2023   LDLCALC 46 09/07/2023   TRIG 168 (H) 09/07/2023   CHOLHDL 2.6 09/07/2023   Last hemoglobin A1c Lab Results  Component Value Date   HGBA1C 6.0 (H) 09/07/2023  Last thyroid functions Lab Results  Component Value Date   TSH 4.460 09/07/2023   Last vitamin D Lab  Results  Component Value Date   VD25OH 50.4 09/07/2023   Last vitamin B12 and Folate Lab Results  Component Value Date   VITAMINB12 443 09/07/2023   FOLATE 18.2 09/07/2023     Assessment & Plan:   Problem List Items Addressed This Visit       Essential hypertension   Remains adequately controlled with Toprol-XL.  No changes are indicated today.      Permanent atrial fibrillation (HCC) - Primary   Irregularly irregular rate and rhythm on exam today.  Remains on renally dosed Eliquis and Toprol-XL.  Recently seen by cardiology for follow-up.  Eliquis refilled today.      PVD (peripheral vascular disease) with claudication (HCC)   Followed by vascular surgery.  Recently seen for follow-up with concern for cellulitis.  Treated with doxycycline.  His right leg appears to be healing nicely.  He is receiving routine home health wound care.  Vascular surgery follow-up is scheduled for 3/4.      Congestive heart failure (CHF) (HCC)   EF 20-25% on TTE from March 2024.  Remains euvolemic on exam.  He is prescribed Farxiga, Toprol-XL, and Lasix.  Not a candidate for ICD per cardiology.  Recently seen by cardiology for follow-up.  No medication changes are indicated.      Type 2 diabetes mellitus with diabetic chronic kidney disease (HCC)   A1c 6.0 on labs from November 2024.  He remains on Farxiga 10 mg daily.  No changes are indicated today.  Repeat A1c at follow-up in 3 months.      CKD stage 3b, GFR 30-44 ml/min (HCC)   Stable on labs from November 2024.  Remains on Farxiga.      Dyslipidemia   Lipid panel updated in November 2024 reflects excellent control with pravastatin 20 mg daily.      Return in about 3 months (around 03/12/2024).   Billie Lade, MD

## 2023-12-14 NOTE — Assessment & Plan Note (Signed)
EF 20-25% on TTE from March 2024.  Remains euvolemic on exam.  He is prescribed Farxiga, Toprol-XL, and Lasix.  Not a candidate for ICD per cardiology.  Recently seen by cardiology for follow-up.  No medication changes are indicated.

## 2023-12-14 NOTE — Assessment & Plan Note (Signed)
Stable on labs from November 2024.  Remains on Farxiga.

## 2023-12-14 NOTE — Assessment & Plan Note (Signed)
Remains adequately controlled with Toprol-XL.  No changes are indicated today.

## 2023-12-14 NOTE — Assessment & Plan Note (Signed)
Followed by vascular surgery.  Recently seen for follow-up with concern for cellulitis.  Treated with doxycycline.  His right leg appears to be healing nicely.  He is receiving routine home health wound care.  Vascular surgery follow-up is scheduled for 3/4.

## 2023-12-14 NOTE — Assessment & Plan Note (Signed)
Irregularly irregular rate and rhythm on exam today.  Remains on renally dosed Eliquis and Toprol-XL.  Recently seen by cardiology for follow-up.  Eliquis refilled today.

## 2023-12-14 NOTE — Patient Instructions (Signed)
It was a pleasure to see you today.  Thank you for giving us the opportunity to be involved in your care.  Below is a brief recap of your visit and next steps.  We will plan to see you again in 3 months.  Summary No medication changes today. We will plan for follow up in 3 months.   

## 2023-12-14 NOTE — Assessment & Plan Note (Signed)
Lipid panel updated in November 2024 reflects excellent control with pravastatin 20 mg daily.

## 2023-12-14 NOTE — Assessment & Plan Note (Signed)
A1c 6.0 on labs from November 2024.  He remains on Farxiga 10 mg daily.  No changes are indicated today.  Repeat A1c at follow-up in 3 months.

## 2023-12-25 ENCOUNTER — Encounter: Payer: Self-pay | Admitting: Vascular Surgery

## 2023-12-25 ENCOUNTER — Ambulatory Visit (INDEPENDENT_AMBULATORY_CARE_PROVIDER_SITE_OTHER): Payer: Medicare Other | Admitting: Vascular Surgery

## 2023-12-25 VITALS — BP 168/76 | HR 70 | Resp 20 | Ht 68.0 in | Wt 174.0 lb

## 2023-12-25 DIAGNOSIS — I739 Peripheral vascular disease, unspecified: Secondary | ICD-10-CM | POA: Diagnosis not present

## 2023-12-25 DIAGNOSIS — I872 Venous insufficiency (chronic) (peripheral): Secondary | ICD-10-CM | POA: Diagnosis not present

## 2023-12-25 NOTE — Progress Notes (Signed)
 Patient name: [name] MRN: [id number] DOB: January 22, 1936 Sex: male  REASON FOR VISIT: Wound check  HPI: [name] is a 88 y.o. male with history of atrial fibrillation, diabetes, hypertension, peripheral vascular disease status post left AKA that presents for right lower extremity wound check.  He has been doing compression wraps to the right leg with the help of his wife.  These wounds are looking much better.  His ulcer on the right great toe has healed.  He does have a history of bilateral common iliac artery angioplasty with stenting by Dr. Edilia Bo on 08/20/2020.    Most recently underwent went right leg angiogram with Dr. Myra Gianotti on 10/31/2022 showing that the iliac stents were patent with a patent right common femoral profunda widely patent with a less than 30% stenosis in the SFA and the remaining portion of the popliteal patent and two-vessel runoff in the PT and peroneal.  Past Medical History:  Diagnosis Date   Arteriosclerotic cardiovascular disease (ASCVD)    CABG in 1990s; 2002 total obstruction of LAD, CX and RCA with patent grafts and nl EF; Stress nuc. 2008 - mild LV dilation; normal EF; questionable small anteroapical scar; no ischemia   Arthritis    "left knee" (11/08/2015)   Atrial fibrillation (HCC)    Cellulitis 11/08/2015   "both feet"   Chronic anticoagulation    Dental crowns present    Diabetic peripheral neuropathy (HCC)    bilateral lower leg, hands   History of blood transfusion    "related to my leg surgery?"   Hypertension    states under control with meds., has been on med. x "long time"   Iron deficiency anemia    Jaw cancer (HCC) 1990s   "squamous cell"   Myocardial infarction (HCC) 1990s   "after my jaw OR"   Pedal edema    "not a lot", per pt.   Peripheral vascular disease (HCC)    Presence of retained hardware 07/2015   failed hardware jaw   Runny nose 07/27/2015   clear drainage, per pt.   Stroke (HCC)    Type II diabetes mellitus  (HCC)    "diet controlled" (11/08/2015)    Past Surgical History:  Procedure Laterality Date   ABDOMINAL AORTOGRAM W/LOWER EXTREMITY Bilateral 08/20/2020   Procedure: ABDOMINAL AORTOGRAM W/LOWER EXTREMITY;  Surgeon: Chuck Hint, MD;  Location: [facility] INVASIVE CV LAB;  Service: Cardiovascular;  Laterality: Bilateral;   ABDOMINAL AORTOGRAM W/LOWER EXTREMITY Right 10/31/2022   Procedure: ABDOMINAL AORTOGRAM W/LOWER EXTREMITY;  Surgeon: Nada Libman, MD;  Location: [facility] INVASIVE CV LAB;  Service: Cardiovascular;  Laterality: Right;   AMPUTATION Left 11/11/2015   Procedure: LEFT ABOVE KNEE AMPUTATION;  Surgeon: Chuck Hint, MD;  Location: [facility] OR;  Service: Vascular;  Laterality: Left;   CARDIAC CATHETERIZATION  1990s; 04/16/2001   CATARACT EXTRACTION W/ INTRAOCULAR LENS  IMPLANT, BILATERAL Bilateral 09/2015   COLONOSCOPY N/A 11/27/2014   Procedure: COLONOSCOPY;  Surgeon: Malissa Hippo, MD;  Location: [facility]O [facility];  Service: Endoscopy;  Laterality: N/A;  830   CORONARY ARTERY BYPASS GRAFT  1990's   "CABG X4"   ESOPHAGOGASTRODUODENOSCOPY N/A 02/14/2018   Procedure: ESOPHAGOGASTRODUODENOSCOPY (EGD);  Surgeon: Malissa Hippo, MD;  Location: [facility];  Service: Endoscopy;  Laterality: N/A;   ESOPHAGOGASTRODUODENOSCOPY (EGD) WITH PROPOFOL N/A 02/25/2018   Procedure: ESOPHAGOGASTRODUODENOSCOPY (EGD) WITH PROPOFOL;  Surgeon: Malissa Hippo, MD;  Location: [facility];  Service: Endoscopy;  Laterality: N/A;   FRACTURE  SURGERY     GIVENS CAPSULE STUDY N/A 02/15/2018   Procedure: GIVENS CAPSULE STUDY;  Surgeon: Malissa Hippo, MD;  Location: [facility];  Service: Endoscopy;  Laterality: N/A;   GIVENS CAPSULE STUDY N/A 03/27/2018   Procedure: GIVENS CAPSULE STUDY;  Surgeon: Malissa Hippo, MD;  Location: [facility];  Service: Endoscopy;  Laterality: N/A;   I & D EXTREMITY Left 11/10/2015   Procedure: INCISION AND DRAINAGE LEFT FOOT, AMPUTATION OF LEFT THIRD TOE;  Surgeon: Fransisco Hertz, MD;  Location: [facility] OR;  Service: Vascular;  Laterality: Left;   INCISION AND DRAINAGE ABSCESS Left 06/16/2005   wide exc. abscess 5th toe   IR ANGIOGRAM VISCERAL SELECTIVE  03/28/2018   IR ANGIOGRAM VISCERAL SELECTIVE  03/28/2018   IR CT HEAD LTD  01/04/2019   IR PERCUTANEOUS ART THROMBECTOMY/INFUSION INTRACRANIAL INC DIAG ANGIO  01/04/2019   IR US GUIDE VASC ACCESS LEFT  03/28/2018   MANDIBULAR HARDWARE REMOVAL Left 08/02/2015   Procedure:  HARDWARE REMOVAL TWO MANDIBULAR SCREWS;  Surgeon: Felton Clinton, DDS;  Location: [facility];  Service: Oral Surgery;  Laterality: Left;   ORIF TIBIA FRACTURE Left ~ 1970   PERIPHERAL VASCULAR CATHETERIZATION N/A 08/14/2016   Procedure: Abdominal Aortogram w/Lower Extremity;  Surgeon: Chuck Hint, MD;  Location: [facility] INVASIVE CV LAB;  Service: Cardiovascular;  Laterality: N/A;   PERIPHERAL VASCULAR INTERVENTION Bilateral 08/20/2020   Procedure: PERIPHERAL VASCULAR INTERVENTION;  Surgeon: Chuck Hint, MD;  Location: [facility] INVASIVE CV LAB;  Service: Cardiovascular;  Laterality: Bilateral;  iliac stents   RADIOLOGY WITH ANESTHESIA N/A 01/04/2019   Procedure: CODE STROKE;  Surgeon: Radiologist, Medication, MD;  Location: [facility] OR;  Service: Radiology;  Laterality: N/A;   SQUAMOUS CELL CARCINOMA EXCISION Left 1993   "took my jaw out; got cadavar in there now"   TONSILLECTOMY      Family History  Problem Relation Age of Onset   Heart disease Mother     SOCIAL HISTORY: Social History   Tobacco Use   Smoking status: Never    Passive exposure: Never   Smokeless tobacco: Never  Substance Use Topics   Alcohol use: No    Alcohol/week: 0.0 standard drinks of alcohol    Allergies  Allergen Reactions   Lipitor [Atorvastatin] Other (See Comments)    SEVERE HEADACHE   Simvastatin Other (See Comments)    MUSCLE ACHES   Cefuroxime Diarrhea   Penicillins Rash    Has patient had a PCN reaction causing immediate rash,  facial/tongue/throat swelling, SOB or lightheadedness with hypotension: Yes Has patient had a PCN reaction causing severe rash involving mucus membranes or skin necrosis: No Has patient had a PCN reaction that required hospitalization No Has patient had a PCN reaction occurring within the last 10 years: No If all of the above answers are "NO", then may proceed with Cephalosporin use.    Sulfa Antibiotics Rash    Tolerates silver sulfadiazine cream at home    Current Outpatient Medications  Medication Sig Dispense Refill   acetaminophen (TYLENOL) 325 MG tablet Take 2 tablets (650 mg total) by mouth every 6 (six) hours as needed for moderate pain. (Patient taking differently: Take 1,000 mg by mouth every 6 (six) hours as needed for moderate pain (pain score 4-6).) 30 tablet 0   apixaban (ELIQUIS) 2.5 MG TABS tablet Take 1 tablet (2.5 mg total) by mouth 2 (two) times daily. 180 tablet 3   augmented betamethasone dipropionate (DIPROLENE-AF) 0.05 % cream  Apply topically.     calcium-vitamin D (OSCAL WITH D) 500-5 MG-MCG tablet Take 2 tablets by mouth 2 (two) times daily. 60 tablet 2   carboxymethylcellul-glycerin (OPTIVE) 0.5-0.9 % ophthalmic solution Place 1 drop into both eyes daily as needed for dry eyes.     cetirizine (ZYRTEC) 10 MG tablet Take 10 mg by mouth every morning.     cholecalciferol (VITAMIN D3) 25 MCG (1000 UNIT) tablet Take 2,000 Units by mouth daily.     dapagliflozin propanediol (FARXIGA) 10 MG TABS tablet Take 1 tablet (10 mg total) by mouth daily. 90 tablet 3   doxycycline (VIBRAMYCIN) 100 MG capsule Take 1 capsule (100 mg total) by mouth 2 (two) times daily. 14 capsule 0   feeding supplement (BOOST / RESOURCE BREEZE) LIQD Take 1 Container by mouth 2 (two) times daily between meals. (Patient taking differently: Take 237 mLs by mouth 2 (two) times daily between meals. Ensue)     furosemide (LASIX) 40 MG tablet Take 1 tablet (40 mg total) by mouth daily. 30 tablet 3   magnesium  oxide (MAG-OX) 400 (240 Mg) MG tablet Take 1 tablet (400 mg total) by mouth daily. 30 tablet 3   metoprolol succinate (TOPROL XL) 25 MG 24 hr tablet Take 1 tablet (25 mg total) by mouth daily. 90 tablet 3   Omega-3 Fatty Acids (FISH OIL) 1200 MG CAPS Take 1,200 mg by mouth every morning.     Pediatric Multivitamins-Iron (FLINTSTONES PLUS IRON PO) Take 1 tablet by mouth daily.     potassium chloride SA (KLOR-CON M) 20 MEQ tablet Take 2 tablets (40 mEq total) by mouth daily. 60 tablet 3   pravastatin (PRAVACHOL) 20 MG tablet Take 1 tablet (20 mg total) by mouth every evening. 30 tablet 3   Probiotic Product (PHILLIPS COLON HEALTH PO) Take 1 capsule by mouth daily.      silver sulfADIAZINE (SILVADENE) 1 % cream Apply 1 application topically daily. (Patient taking differently: Apply 1 application  topically daily. Between toes)     sodium bicarbonate 650 MG tablet Take 1,300 mg by mouth 2 (two) times daily.     No current facility-administered medications for this visit.    REVIEW OF SYSTEMS:  [X]  denotes positive finding, [ ]  denotes negative finding Cardiac  Comments:  Chest pain or chest pressure:    Shortness of breath upon exertion:    Short of breath when lying flat:    Irregular heart rhythm:        Vascular    Pain in calf, thigh, or hip brought on by ambulation:    Pain in feet at night that wakes you up from your sleep:     Blood clot in your veins:    Leg swelling:         Pulmonary    Oxygen at home:    Productive cough:     Wheezing:         Neurologic    Sudden weakness in arms or legs:     Sudden numbness in arms or legs:     Sudden onset of difficulty speaking or slurred speech:    Temporary loss of vision in one eye:     Problems with dizziness:         Gastrointestinal    Blood in stool:     Vomited blood:         Genitourinary    Burning when urinating:     Blood in urine:  Psychiatric    Major depression:         Hematologic    Bleeding  problems:    Problems with blood clotting too easily:        Skin    Rashes or ulcers:        Constitutional    Fever or chills:      PHYSICAL EXAM: Vitals:   12/25/23 1449  BP: (!) 168/76  Pulse: 70  Resp: 20  SpO2: 97%  Weight: 174 lb (78.9 kg)  Height: 5\' 8"  (1.727 m)    GENERAL: The patient is a well-nourished male, in no acute distress. The vital signs are documented above. CARDIAC: There is a regular rate and rhythm.  VASCULAR:  No palpable pedal pulses        DATA:   N/A  Assessment/Plan:  88 y.o. male with history of atrial fibrillation, diabetes, hypertension, peripheral vascular disease status post left AKA that presents for right lower extremity wound check.  He does have documented mixed arterial and venous disease.  This is in the setting of bilateral common iliac artery angioplasty with stenting in 2021 by Dr. Edilia Bo.  Last angiogram in 2024 showed everything patent with some mild SFA disease.  Wounds are looking better and I discussed continued ace wraps with compression as well as leg elevation.  The ulcer on the right great toe is healed which is excellent.  I will order a reflux study when he returns in 1 month for wound check as I do not see this workup having been completed in the past.  Cephus Shelling, MD [facility]: [phone]

## 2024-01-09 ENCOUNTER — Other Ambulatory Visit: Payer: Self-pay

## 2024-01-09 ENCOUNTER — Telehealth: Payer: Self-pay | Admitting: Internal Medicine

## 2024-01-09 MED ORDER — METOPROLOL SUCCINATE ER 25 MG PO TB24: 25.0000 mg | ORAL_TABLET | Freq: Every day | ORAL | 3 refills | Status: AC

## 2024-01-09 NOTE — Telephone Encounter (Signed)
Refills sent to [facility]

## 2024-01-09 NOTE — Telephone Encounter (Signed)
 Patient wife here pt needing refill on metoprolol succinate (TOPROL XL) 25 MG 24 hr tablet [[id number]] says they usually get it through the mail but they are unable to get it through the mail now- wanting it through the [facility] on [address]. Please advise Thank you

## 2024-01-10 ENCOUNTER — Telehealth: Payer: Self-pay

## 2024-01-10 NOTE — Telephone Encounter (Signed)
 Triage: Romie Jumper, RN at [facility] called reporting slight swelling around the ankle, a bit of pain, redness and dry skin.   -returned call to [facility] who thought I should call pt's wife about how she is doing with the dressing changes on his leg. -noted the use of xeroform guaze, dry dressing and coban.  Wife states she has been also using Aveno on the whole leg.  She denies any heat to the leg, but looks like the areas on the shin/calf are worsening. -advised her to wash with antibiotic soap, avoid using lotions like Aveeno-may cause irritation on fragile skin.  She asked about vaseline because his skin is so dry- states as long as its is not applied too close to the ulcers.  Also, try to tighten-up the coban about the ankle when applying.   -planning on venous studies on 4/2 and PA f/u on 4/17

## 2024-01-11 ENCOUNTER — Other Ambulatory Visit: Payer: Self-pay

## 2024-01-11 DIAGNOSIS — I872 Venous insufficiency (chronic) (peripheral): Secondary | ICD-10-CM

## 2024-01-23 ENCOUNTER — Ambulatory Visit (HOSPITAL_COMMUNITY)
Admission: RE | Admit: 2024-01-23 | Discharge: 2024-01-23 | Disposition: A | Discharge status: Home / Self Care | Source: Ambulatory Visit | Attending: Surgery | Admitting: Surgery

## 2024-01-23 DIAGNOSIS — I872 Venous insufficiency (chronic) (peripheral): Secondary | ICD-10-CM | POA: Diagnosis not present

## 2024-01-25 ENCOUNTER — Telehealth: Payer: Self-pay

## 2024-01-25 NOTE — Telephone Encounter (Signed)
 Triage:  -Received message from [place], [place] @ [facility] (difficult to hear/understand messge) stating pt's wound was looking worse? and wanted to try something else.  -LM on VM to return call

## 2024-02-05 ENCOUNTER — Telehealth: Payer: Self-pay

## 2024-02-05 ENCOUNTER — Ambulatory Visit (INDEPENDENT_AMBULATORY_CARE_PROVIDER_SITE_OTHER): Admitting: Physician Assistant

## 2024-02-05 VITALS — BP 170/78 | HR 72 | Temp 97.9°F | Ht 68.0 in | Wt 174.0 lb

## 2024-02-05 DIAGNOSIS — I739 Peripheral vascular disease, unspecified: Secondary | ICD-10-CM

## 2024-02-05 DIAGNOSIS — L97909 Non-pressure chronic ulcer of unspecified part of unspecified lower leg with unspecified severity: Secondary | ICD-10-CM

## 2024-02-05 DIAGNOSIS — I83009 Varicose veins of unspecified lower extremity with ulcer of unspecified site: Secondary | ICD-10-CM | POA: Diagnosis not present

## 2024-02-05 DIAGNOSIS — I872 Venous insufficiency (chronic) (peripheral): Secondary | ICD-10-CM

## 2024-02-05 MED ORDER — DOXYCYCLINE HYCLATE 100 MG PO CAPS: 100.0000 mg | ORAL_CAPSULE | Freq: Two times a day (BID) | ORAL | 0 refills | Status: DC

## 2024-02-05 NOTE — Progress Notes (Addendum)
 Office Note     CC:  follow up Requesting Provider:  Billie Lade, MD  HPI: [name] is a 88 y.o. (08/30/1936) male who presents for reevaluation of right lower leg wounds.  Surgical history significant for left AKA.  He is being seen on a weekly basis by [facility] for wound care.  He and his wife report that they are no longer utilizing an Radio broadcast assistant.  He states he sits with his legs down in the dependent position all day.  He denies fevers, chills, nausea/vomiting.  He has history of bilateral common iliac artery stenting by Dr. Edilia Bo in 2021.  Most recently in January 2024 he underwent aortogram demonstrating widely patent iliacs with mild right SFA stenosis.  He denies tobacco use.  He is on Eliquis for atrial fibrillation.  He is on a statin daily.   Past Medical History:  Diagnosis Date   Arteriosclerotic cardiovascular disease (ASCVD)    CABG in 1990s; 2002 total obstruction of LAD, CX and RCA with patent grafts and nl EF; Stress nuc. 2008 - mild LV dilation; normal EF; questionable small anteroapical scar; no ischemia   Arthritis    "left knee" (11/08/2015)   Atrial fibrillation (HCC)    Cellulitis 11/08/2015   "both feet"   Chronic anticoagulation    Dental crowns present    Diabetic peripheral neuropathy (HCC)    bilateral lower leg, hands   History of blood transfusion    "related to my leg surgery?"   Hypertension    states under control with meds., has been on med. x "long time"   Iron deficiency anemia    Jaw cancer (HCC) 1990s   "squamous cell"   Myocardial infarction (HCC) 1990s   "after my jaw OR"   Pedal edema    "not a lot", per pt.   Peripheral vascular disease (HCC)    Presence of retained hardware 07/2015   failed hardware jaw   Runny nose 07/27/2015   clear drainage, per pt.   Stroke (HCC)    Type II diabetes mellitus (HCC)    "diet controlled" (11/08/2015)    Past Surgical History:  Procedure Laterality Date   ABDOMINAL AORTOGRAM W/LOWER  EXTREMITY Bilateral 08/20/2020   Procedure: ABDOMINAL AORTOGRAM W/LOWER EXTREMITY;  Surgeon: Chuck Hint, MD;  Location: [facility] INVASIVE CV LAB;  Service: Cardiovascular;  Laterality: Bilateral;   ABDOMINAL AORTOGRAM W/LOWER EXTREMITY Right 10/31/2022   Procedure: ABDOMINAL AORTOGRAM W/LOWER EXTREMITY;  Surgeon: Nada Libman, MD;  Location: [facility] INVASIVE CV LAB;  Service: Cardiovascular;  Laterality: Right;   AMPUTATION Left 11/11/2015   Procedure: LEFT ABOVE KNEE AMPUTATION;  Surgeon: Chuck Hint, MD;  Location: [facility] OR;  Service: Vascular;  Laterality: Left;   CARDIAC CATHETERIZATION  1990s; 04/16/2001   CATARACT EXTRACTION W/ INTRAOCULAR LENS  IMPLANT, BILATERAL Bilateral 09/2015   COLONOSCOPY N/A 11/27/2014   Procedure: COLONOSCOPY;  Surgeon: Malissa Hippo, MD;  Location: [facility] ENDO SU[facility];  Service: Endoscopy;  Laterality: N/A;  830   CORONARY ARTERY BYPASS GRAFT  1990's   "CABG X4"   ESOPHAGOGASTRODUODENOSCOPY N/A 02/14/2018   Procedure: ESOPHAGOGASTRODUODENOSCOPY (EGD);  Surgeon: Malissa Hippo, MD;  Location: [facility];  Service: Endoscopy;  Laterality: N/A;   ESOPHAGOGASTRODUODENOSCOPY (EGD) WITH PROPOFOL N/A 02/25/2018   Procedure: ESOPHAGOGASTRODUODENOSCOPY (EGD) WITH PROPOFOL;  Surgeon: Malissa Hippo, MD;  Location: [facility];  Service: Endoscopy;  Laterality: N/A;   FRACTURE SURGERY     GIVENS CAPSULE STUDY N/A 02/15/2018  Procedure: GIVENS CAPSULE STUDY;  Surgeon: Malissa Hippo, MD;  Location: [facility];  Service: Endoscopy;  Laterality: N/A;   GIVENS CAPSULE STUDY N/A 03/27/2018   Procedure: GIVENS CAPSULE STUDY;  Surgeon: Malissa Hippo, MD;  Location: [facility];  Service: Endoscopy;  Laterality: N/A;   I & D EXTREMITY Left 11/10/2015   Procedure: INCISION AND DRAINAGE LEFT FOOT, AMPUTATION OF LEFT THIRD TOE;  Surgeon: Fransisco Hertz, MD;  Location: [facility] OR;  Service: Vascular;  Laterality: Left;   INCISION AND DRAINAGE ABSCESS Left 06/16/2005   wide  exc. abscess 5th toe   IR ANGIOGRAM VISCERAL SELECTIVE  03/28/2018   IR ANGIOGRAM VISCERAL SELECTIVE  03/28/2018   IR CT HEAD LTD  01/04/2019   IR PERCUTANEOUS ART THROMBECTOMY/INFUSION INTRACRANIAL INC DIAG ANGIO  01/04/2019   IR US GUIDE VASC ACCESS LEFT  03/28/2018   MANDIBULAR HARDWARE REMOVAL Left 08/02/2015   Procedure:  HARDWARE REMOVAL TWO MANDIBULAR SCREWS;  Surgeon: Felton Clinton, DDS;  Location: [facility];  Service: Oral Surgery;  Laterality: Left;   ORIF TIBIA FRACTURE Left ~ 1970   PERIPHERAL VASCULAR CATHETERIZATION N/A 08/14/2016   Procedure: Abdominal Aortogram w/Lower Extremity;  Surgeon: Chuck Hint, MD;  Location: [facility] INVASIVE CV LAB;  Service: Cardiovascular;  Laterality: N/A;   PERIPHERAL VASCULAR INTERVENTION Bilateral 08/20/2020   Procedure: PERIPHERAL VASCULAR INTERVENTION;  Surgeon: Chuck Hint, MD;  Location: [facility] INVASIVE CV LAB;  Service: Cardiovascular;  Laterality: Bilateral;  iliac stents   RADIOLOGY WITH ANESTHESIA N/A 01/04/2019   Procedure: CODE STROKE;  Surgeon: Radiologist, Medication, MD;  Location: [facility] OR;  Service: Radiology;  Laterality: N/A;   SQUAMOUS CELL CARCINOMA EXCISION Left 1993   "took my jaw out; got cadavar in there now"   TONSILLECTOMY      Social History   Socioeconomic History   Marital status: Married    Spouse name: Not on file   Number of children: Not on file   Years of education: Not on file   Highest education level: Not on file  Occupational History   Not on file  Tobacco Use   Smoking status: Never    Passive exposure: Never   Smokeless tobacco: Never  Vaping Use   Vaping status: Never Used  Substance and Sexual Activity   Alcohol use: No    Alcohol/week: 0.0 standard drinks of alcohol   Drug use: No   Sexual activity: Yes  Other Topics Concern   Not on file  Social History Narrative   Not on file   Social Drivers of Health   Financial Resource Strain: Not on file  Food Insecurity: No  Food Insecurity (08/11/2022)   Hunger Vital Sign    Worried About Running Out of Food in the Last Year: Never true    Ran Out of Food in the Last Year: Never true  Transportation Needs: No Transportation Needs (08/11/2022)   PRAPARE - Administrator, Civil Service (Medical): No    Lack of Transportation (Non-Medical): No  Physical Activity: Not on file  Stress: Not on file  Social Connections: Not on file  Intimate Partner Violence: Not At Risk (08/11/2022)   Humiliation, Afraid, Rape, and Kick questionnaire    Fear of Current or Ex-Partner: No    Emotionally Abused: No    Physically Abused: No    Sexually Abused: No    Family History  Problem Relation Age of Onset   Heart disease Mother  Current Outpatient Medications  Medication Sig Dispense Refill   acetaminophen (TYLENOL) 325 MG tablet Take 2 tablets (650 mg total) by mouth every 6 (six) hours as needed for moderate pain. (Patient taking differently: Take 1,000 mg by mouth every 6 (six) hours as needed for moderate pain (pain score 4-6).) 30 tablet 0   apixaban (ELIQUIS) 2.5 MG TABS tablet Take 1 tablet (2.5 mg total) by mouth 2 (two) times daily. 180 tablet 3   augmented betamethasone dipropionate (DIPROLENE-AF) 0.05 % cream Apply topically.     calcium-vitamin D (OSCAL WITH D) 500-5 MG-MCG tablet Take 2 tablets by mouth 2 (two) times daily. 60 tablet 2   carboxymethylcellul-glycerin (OPTIVE) 0.5-0.9 % ophthalmic solution Place 1 drop into both eyes daily as needed for dry eyes.     cetirizine (ZYRTEC) 10 MG tablet Take 10 mg by mouth every morning.     cholecalciferol (VITAMIN D3) 25 MCG (1000 UNIT) tablet Take 2,000 Units by mouth daily.     dapagliflozin propanediol (FARXIGA) 10 MG TABS tablet Take 1 tablet (10 mg total) by mouth daily. 90 tablet 3   doxycycline (VIBRAMYCIN) 100 MG capsule Take 1 capsule (100 mg total) by mouth 2 (two) times daily. 14 capsule 0   feeding supplement (BOOST / RESOURCE BREEZE)  LIQD Take 1 Container by mouth 2 (two) times daily between meals. (Patient taking differently: Take 237 mLs by mouth 2 (two) times daily between meals. Ensue)     furosemide (LASIX) 40 MG tablet Take 1 tablet (40 mg total) by mouth daily. 30 tablet 3   magnesium oxide (MAG-OX) 400 (240 Mg) MG tablet Take 1 tablet (400 mg total) by mouth daily. 30 tablet 3   metoprolol succinate (TOPROL XL) 25 MG 24 hr tablet Take 1 tablet (25 mg total) by mouth daily. 90 tablet 3   Omega-3 Fatty Acids (FISH OIL) 1200 MG CAPS Take 1,200 mg by mouth every morning.     Pediatric Multivitamins-Iron (FLINTSTONES PLUS IRON PO) Take 1 tablet by mouth daily.     potassium chloride SA (KLOR-CON M) 20 MEQ tablet Take 2 tablets (40 mEq total) by mouth daily. 60 tablet 3   pravastatin (PRAVACHOL) 20 MG tablet Take 1 tablet (20 mg total) by mouth every evening. 30 tablet 3   Probiotic Product (PHILLIPS COLON HEALTH PO) Take 1 capsule by mouth daily.      silver sulfADIAZINE (SILVADENE) 1 % cream Apply 1 application topically daily. (Patient taking differently: Apply 1 application  topically daily. Between toes)     sodium bicarbonate 650 MG tablet Take 1,300 mg by mouth 2 (two) times daily.     No current facility-administered medications for this visit.    Allergies  Allergen Reactions   Lipitor [Atorvastatin] Other (See Comments)    SEVERE HEADACHE   Simvastatin Other (See Comments)    MUSCLE ACHES   Cefuroxime Diarrhea   Penicillins Rash    Has patient had a PCN reaction causing immediate rash, facial/tongue/throat swelling, SOB or lightheadedness with hypotension: Yes Has patient had a PCN reaction causing severe rash involving mucus membranes or skin necrosis: No Has patient had a PCN reaction that required hospitalization No Has patient had a PCN reaction occurring within the last 10 years: No If all of the above answers are "NO", then may proceed with Cephalosporin use.    Sulfa Antibiotics Rash    Tolerates  silver sulfadiazine cream at home     REVIEW OF SYSTEMS:   [X]  denotes positive  finding, [ ]  denotes negative finding Cardiac  Comments:  Chest pain or chest pressure:    Shortness of breath upon exertion:    Short of breath when lying flat:    Irregular heart rhythm:        Vascular    Pain in calf, thigh, or hip brought on by ambulation:    Pain in feet at night that wakes you up from your sleep:     Blood clot in your veins:    Leg swelling:         Pulmonary    Oxygen at home:    Productive cough:     Wheezing:         Neurologic    Sudden weakness in arms or legs:     Sudden numbness in arms or legs:     Sudden onset of difficulty speaking or slurred speech:    Temporary loss of vision in one eye:     Problems with dizziness:         Gastrointestinal    Blood in stool:     Vomited blood:         Genitourinary    Burning when urinating:     Blood in urine:        Psychiatric    Major depression:         Hematologic    Bleeding problems:    Problems with blood clotting too easily:        Skin    Rashes or ulcers:        Constitutional    Fever or chills:      PHYSICAL EXAMINATION:  Vitals:   02/05/24 1016  BP: (!) 170/78  Pulse: 72  Temp: 97.9 F (36.6 C)  TempSrc: Oral  SpO2: 97%  Weight: 174 lb (78.9 kg)  Height: 5\' 8"  (1.727 m)    General:  WDWN in NAD; vital signs documented above Gait: Not observed HENT: WNL, normocephalic Pulmonary: normal non-labored breathing , without Rales, rhonchi,  wheezing Cardiac: regular HR Abdomen: soft, NT, no masses Skin: without rashes Vascular Exam/Pulses: Right PT brisk by Doppler Extremities: Copious amounts of cracking skin with weeping ulcerations on the right lateral lower leg pictured below Musculoskeletal: no muscle wasting or atrophy  Neurologic: A&O X 3 Psychiatric:  The pt has Normal affect.    Non-Invasive Vascular Imaging:   Right lower extremity venous reflux study negative for  DVT Negative for deep venous reflux Saphenofemoral junction reflux only    ASSESSMENT/PLAN:: 88 y.o. male here for follow up for reevaluation of right leg wounds  [name] returns to complete workup with venous reflux study of the right lower extremity.  He has history of bilateral common iliac artery stents.  Reflux study was negative for DVT and deep reflux.  He only has mild saphenofemoral junction reflux.  Unfortunately the patient hangs his leg in the dependent position throughout most of the day.  He is receiving wound care on a weekly basis by [facility] however has not been in [employer] for some time.  The ulcers on his right lateral lower leg are drastically worse compared to pictures taken even from 1 or 2 months ago.  He has a brisk PT signal and a warm foot.  We will contact [facility] to resume Unna boot placement on a weekly basis.  I have also urged him to elevate his leg periodically during the day.  He will be prescribed 2 weeks of  doxycycline given the appearance of the ulcers.  He will return for a wound check in 1 month.  He and his wife will notify the office if ulcers worsen or fail to improve with Unna boot changes at which point we we will recommend admission to the hospital for IV antibiotics.   Cordie Deters, PA-C [facility] [phone]

## 2024-02-05 NOTE — Telephone Encounter (Signed)
 Home Health:   -Jodie Munson, RN @ [facility] called for verbal orders  -VO; Unna Boot changes QWK  -update plan reviewed with RN : Doxycycline x 2 weeks, if worsening wounds may require hospitalization.   Appt to eturn to clinic 03/11/24

## 2024-02-26 ENCOUNTER — Other Ambulatory Visit: Payer: Self-pay

## 2024-02-26 MED ORDER — FUROSEMIDE 40 MG PO TABS: 40.0000 mg | ORAL_TABLET | Freq: Every day | ORAL | 3 refills | Status: DC

## 2024-02-27 ENCOUNTER — Telehealth: Payer: Self-pay | Admitting: Internal Medicine

## 2024-02-27 NOTE — Telephone Encounter (Signed)
 Disregard patient going to [facility] to pick it up.

## 2024-02-27 NOTE — Telephone Encounter (Signed)
 Prescription Request  02/27/2024  LOV: 12/14/2023  What is the name of the medication or equipment? furosemide  (LASIX ) 40 MG tablet [[id number]]   Have you contacted your pharmacy to request a refill? Yes   Which pharmacy would you like this sent to?  [facility]   Patient notified that their request is being sent to the clinical staff for review and that they should receive a response within 2 business days.   Please advise at  walked into office

## 2024-03-11 ENCOUNTER — Ambulatory Visit: Admitting: Vascular Surgery

## 2024-03-13 ENCOUNTER — Encounter: Payer: Self-pay | Admitting: Vascular Surgery

## 2024-03-13 ENCOUNTER — Ambulatory Visit (INDEPENDENT_AMBULATORY_CARE_PROVIDER_SITE_OTHER): Admitting: Vascular Surgery

## 2024-03-13 VITALS — BP 149/68 | HR 64 | Ht 68.0 in | Wt 174.0 lb

## 2024-03-13 DIAGNOSIS — I872 Venous insufficiency (chronic) (peripheral): Secondary | ICD-10-CM

## 2024-03-13 NOTE — Progress Notes (Signed)
 Office Note     CC:  follow up Requesting Provider:  Tobi Fortes, MD  HPI: [name] is a 88 y.o. (15-Jun-1936) male who presents for reevaluation of right lower leg wounds.  Surgical history significant for left AKA.  He is being seen on a weekly basis by [facility] for wound care.  He and his wife report that they are no longer utilizing an Radio broadcast assistant.  He states he sits with his legs down in the dependent position all day.  He denies fevers, chills, nausea/vomiting.  He has history of bilateral common iliac artery stenting by Dr. Shaunna Delaware in 2021.  Most recently in January 2024 he underwent aortogram demonstrating widely patent iliacs with mild right SFA stenosis.  He denies tobacco use.  He is on Eliquis  for atrial fibrillation.  He is on a statin daily.  03/13/24: Patient returns to clinic for wound check.  He is doing well overall.  He reports significant improvement in the wound with doxycycline  and Unna boot therapy.  The wounds are closing slowly.  He and his wife are appreciative.   Past Medical History:  Diagnosis Date   Arteriosclerotic cardiovascular disease (ASCVD)    CABG in 1990s; 2002 total obstruction of LAD, CX and RCA with patent grafts and nl EF; Stress nuc. 2008 - mild LV dilation; normal EF; questionable small anteroapical scar; no ischemia   Arthritis    "left knee" (11/08/2015)   Atrial fibrillation (HCC)    Cellulitis 11/08/2015   "both feet"   Chronic anticoagulation    Dental crowns present    Diabetic peripheral neuropathy (HCC)    bilateral lower leg, hands   History of blood transfusion    "related to my leg surgery?"   Hypertension    states under control with meds., has been on med. x "long time"   Iron deficiency anemia    Jaw cancer (HCC) 1990s   "squamous cell"   Myocardial infarction (HCC) 1990s   "after my jaw OR"   Pedal edema    "not a lot", per pt.   Peripheral vascular disease (HCC)    Presence of retained hardware 07/2015   failed  hardware jaw   Runny nose 07/27/2015   clear drainage, per pt.   Stroke (HCC)    Type II diabetes mellitus (HCC)    "diet controlled" (11/08/2015)    Past Surgical History:  Procedure Laterality Date   ABDOMINAL AORTOGRAM W/LOWER EXTREMITY Bilateral 08/20/2020   Procedure: ABDOMINAL AORTOGRAM W/LOWER EXTREMITY;  Surgeon: Dannis Dy, MD;  Location: [facility] INVASIVE CV LAB;  Service: Cardiovascular;  Laterality: Bilateral;   ABDOMINAL AORTOGRAM W/LOWER EXTREMITY Right 10/31/2022   Procedure: ABDOMINAL AORTOGRAM W/LOWER EXTREMITY;  Surgeon: Margherita Shell, MD;  Location: [facility] INVASIVE CV LAB;  Service: Cardiovascular;  Laterality: Right;   AMPUTATION Left 11/11/2015   Procedure: LEFT ABOVE KNEE AMPUTATION;  Surgeon: Dannis Dy, MD;  Location: [facility] OR;  Service: Vascular;  Laterality: Left;   CARDIAC CATHETERIZATION  1990s; 04/16/2001   CATARACT EXTRACTION W/ INTRAOCULAR LENS  IMPLANT, BILATERAL Bilateral 09/2015   COLONOSCOPY N/A 11/27/2014   Procedure: COLONOSCOPY;  Surgeon: Ruby Corporal, MD;  [facility]: [facility];  Service: Endoscopy;  Laterality: N/A;  830   CORONARY ARTERY BYPASS GRAFT  1990's   "CABG X4"   ESOPHAGOGASTRODUODENOSCOPY N/A 02/14/2018   Procedure: ESOPHAGOGASTRODUODENOSCOPY (EGD);  Surgeon: Ruby Corporal, MD;  [facility];  Service: Endoscopy;  Laterality: N/A;   ESOPHAGOGASTRODUODENOSCOPY (EGD)  WITH PROPOFOL  N/A 02/25/2018   Procedure: ESOPHAGOGASTRODUODENOSCOPY (EGD) WITH PROPOFOL ;  Surgeon: Ruby Corporal, MD;  [facility];  Service: Endoscopy;  Laterality: N/A;   FRACTURE SURGERY     GIVENS CAPSULE STUDY N/A 02/15/2018   Procedure: GIVENS CAPSULE STUDY;  Surgeon: Ruby Corporal, MD;  [facility];  Service: Endoscopy;  Laterality: N/A;   GIVENS CAPSULE STUDY N/A 03/27/2018   Procedure: GIVENS CAPSULE STUDY;  Surgeon: Ruby Corporal, MD;  [facility];  Service: Endoscopy;  Laterality: N/A;   I & D  EXTREMITY Left 11/10/2015   Procedure: INCISION AND DRAINAGE LEFT FOOT, AMPUTATION OF LEFT THIRD TOE;  Surgeon: Arvil Lauber, MD;  Location: [facility] OR;  Service: Vascular;  Laterality: Left;   INCISION AND DRAINAGE ABSCESS Left 06/16/2005   wide exc. abscess 5th toe   IR ANGIOGRAM VISCERAL SELECTIVE  03/28/2018   IR ANGIOGRAM VISCERAL SELECTIVE  03/28/2018   IR CT HEAD LTD  01/04/2019   IR PERCUTANEOUS ART THROMBECTOMY/INFUSION INTRACRANIAL INC DIAG ANGIO  01/04/2019   IR US  GUIDE VASC ACCESS LEFT  03/28/2018   MANDIBULAR HARDWARE REMOVAL Left 08/02/2015   Procedure:  HARDWARE REMOVAL TWO MANDIBULAR SCREWS;  Surgeon: Lyell Samuel, DDS;  Location: [facility];  Service: Oral Surgery;  Laterality: Left;   ORIF TIBIA FRACTURE Left ~ 1970   PERIPHERAL VASCULAR CATHETERIZATION N/A 08/14/2016   Procedure: Abdominal Aortogram w/Lower Extremity;  Surgeon: Dannis Dy, MD;  Location: [facility] INVASIVE CV LAB;  Service: Cardiovascular;  Laterality: N/A;   PERIPHERAL VASCULAR INTERVENTION Bilateral 08/20/2020   Procedure: PERIPHERAL VASCULAR INTERVENTION;  Surgeon: Dannis Dy, MD;  Location: [facility] INVASIVE CV LAB;  Service: Cardiovascular;  Laterality: Bilateral;  iliac stents   RADIOLOGY WITH ANESTHESIA N/A 01/04/2019   Procedure: CODE STROKE;  Surgeon: Radiologist, Medication, MD;  Location: [facility] OR;  Service: Radiology;  Laterality: N/A;   SQUAMOUS CELL CARCINOMA EXCISION Left 1993   "took my jaw out; got cadavar in there now"   TONSILLECTOMY      Social History   Socioeconomic History   Marital status: Married    Spouse name: Not on file   Number of children: Not on file   Years of education: Not on file   Highest education level: Not on file  Occupational History   Not on file  Tobacco Use   Smoking status: Never    Passive exposure: Never   Smokeless tobacco: Never  Vaping Use   Vaping status: Never Used  Substance and Sexual Activity   Alcohol  use: No    Alcohol /week:  0.0 standard drinks of alcohol    Drug use: No   Sexual activity: Yes  Other Topics Concern   Not on file  Social History Narrative   Not on file   Social Drivers of Health   Financial Resource Strain: Not on file  Food Insecurity: No Food Insecurity (08/11/2022)   Hunger Vital Sign    Worried About Running Out of Food in the Last Year: Never true    Ran Out of Food in the Last Year: Never true  Transportation Needs: No Transportation Needs (08/11/2022)   PRAPARE - Administrator, Civil Service (Medical): No    Lack of Transportation (Non-Medical): No  Physical Activity: Not on file  Stress: Not on file  Social Connections: Not on file  Intimate Partner Violence: Not At Risk (08/11/2022)   Humiliation, Afraid, Rape, and Kick questionnaire  Fear of Current or Ex-Partner: No    Emotionally Abused: No    Physically Abused: No    Sexually Abused: No    Family History  Problem Relation Age of Onset   Heart disease Mother     Current Outpatient Medications  Medication Sig Dispense Refill   acetaminophen  (TYLENOL ) 325 MG tablet Take 2 tablets (650 mg total) by mouth every 6 (six) hours as needed for moderate pain. (Patient taking differently: Take 1,000 mg by mouth every 6 (six) hours as needed for moderate pain (pain score 4-6).) 30 tablet 0   apixaban  (ELIQUIS ) 2.5 MG TABS tablet Take 1 tablet (2.5 mg total) by mouth 2 (two) times daily. 180 tablet 3   augmented betamethasone dipropionate (DIPROLENE-AF) 0.05 % cream Apply topically.     calcium -vitamin D  (OSCAL WITH D) 500-5 MG-MCG tablet Take 2 tablets by mouth 2 (two) times daily. 60 tablet 2   carboxymethylcellul-glycerin  (OPTIVE) 0.5-0.9 % ophthalmic solution Place 1 drop into both eyes daily as needed for dry eyes.     cetirizine (ZYRTEC) 10 MG tablet Take 10 mg by mouth every morning.     cholecalciferol  (VITAMIN D3) 25 MCG (1000 UNIT) tablet Take 2,000 Units by mouth daily.     dapagliflozin  propanediol  (FARXIGA ) 10 MG TABS tablet Take 1 tablet (10 mg total) by mouth daily. 90 tablet 3   feeding supplement (BOOST / RESOURCE BREEZE) LIQD Take 1 Container by mouth 2 (two) times daily between meals. (Patient taking differently: Take 237 mLs by mouth 2 (two) times daily between meals. Ensue)     furosemide  (LASIX ) 40 MG tablet Take 1 tablet (40 mg total) by mouth daily. 30 tablet 3   magnesium  oxide (MAG-OX) 400 (240 Mg) MG tablet Take 1 tablet (400 mg total) by mouth daily. 30 tablet 3   metoprolol  succinate (TOPROL  XL) 25 MG 24 hr tablet Take 1 tablet (25 mg total) by mouth daily. 90 tablet 3   Omega-3 Fatty Acids  (FISH OIL) 1200 MG CAPS Take 1,200 mg by mouth every morning.     Pediatric Multivitamins-Iron (FLINTSTONES PLUS IRON PO) Take 1 tablet by mouth daily.     potassium chloride  SA (KLOR-CON  M) 20 MEQ tablet Take 2 tablets (40 mEq total) by mouth daily. 60 tablet 3   pravastatin  (PRAVACHOL ) 20 MG tablet Take 1 tablet (20 mg total) by mouth every evening. 30 tablet 3   Probiotic Product (PHILLIPS COLON HEALTH PO) Take 1 capsule by mouth daily.      silver  sulfADIAZINE  (SILVADENE ) 1 % cream Apply 1 application topically daily. (Patient taking differently: Apply 1 application  topically daily. Between toes)     sodium bicarbonate  650 MG tablet Take 1,300 mg by mouth 2 (two) times daily.     No current facility-administered medications for this visit.    Allergies  Allergen Reactions   Lipitor [Atorvastatin ] Other (See Comments)    SEVERE HEADACHE   Simvastatin  Other (See Comments)    MUSCLE ACHES   Cefuroxime Diarrhea   Penicillins Rash    Has patient had a PCN reaction causing immediate rash, facial/tongue/throat swelling, SOB or lightheadedness with hypotension: Yes Has patient had a PCN reaction causing severe rash involving mucus membranes or skin necrosis: No Has patient had a PCN reaction that required hospitalization No Has patient had a PCN reaction occurring within the last 10  years: No If all of the above answers are "NO", then may proceed with Cephalosporin use.    Sulfa Antibiotics  Rash    Tolerates silver  sulfadiazine  cream at home    PHYSICAL EXAMINATION:  Vitals:   03/13/24 1026  BP: (!) 149/68  Pulse: 64  SpO2: 96%  Weight: 174 lb (78.9 kg)  Height: 5\' 8"  (1.727 m)   Elderly man in no distress In a wheelchair Regular rate and rhythm Unlabored breathing Left above-knee amputation Right lower extremity with significantly improved venous ulceration about the lateral malleolus.  This is now about the size of a silver  dollar.  There is no surrounding cellulitis.  Non-Invasive Vascular Imaging:   Right lower extremity venous reflux study negative for DVT Negative for deep venous reflux Saphenofemoral junction reflux only    ASSESSMENT/PLAN:: 88 y.o. male with chronic venous insufficiency of the right lower extremity causing venous ulceration (C6 disease).  His wounds are improving significantly.  Would recommend continued Unna boot therapy as directed by his home health wound nurse.  He should follow-up with us  in about a month to reevaluate things.  [name] Little. Edgardo Goodwill, MD [facility] Phone Number: [phone] 03/13/2024 11:17 AM

## 2024-03-14 ENCOUNTER — Ambulatory Visit (INDEPENDENT_AMBULATORY_CARE_PROVIDER_SITE_OTHER): Payer: Medicare Other | Admitting: Internal Medicine

## 2024-03-14 ENCOUNTER — Encounter: Payer: Self-pay | Admitting: Internal Medicine

## 2024-03-14 VITALS — BP 138/72 | HR 88 | Ht 68.0 in | Wt 176.4 lb

## 2024-03-14 DIAGNOSIS — I739 Peripheral vascular disease, unspecified: Secondary | ICD-10-CM

## 2024-03-14 DIAGNOSIS — N1832 Chronic kidney disease, stage 3b: Secondary | ICD-10-CM

## 2024-03-14 DIAGNOSIS — Z7984 Long term (current) use of oral hypoglycemic drugs: Secondary | ICD-10-CM

## 2024-03-14 DIAGNOSIS — I10 Essential (primary) hypertension: Secondary | ICD-10-CM

## 2024-03-14 DIAGNOSIS — E785 Hyperlipidemia, unspecified: Secondary | ICD-10-CM

## 2024-03-14 DIAGNOSIS — E1122 Type 2 diabetes mellitus with diabetic chronic kidney disease: Secondary | ICD-10-CM | POA: Diagnosis not present

## 2024-03-14 DIAGNOSIS — I4821 Permanent atrial fibrillation: Secondary | ICD-10-CM | POA: Diagnosis not present

## 2024-03-14 NOTE — Assessment & Plan Note (Signed)
 Remains adequately controlled on current antihypertensive regimen.  No medication changes are indicated today.

## 2024-03-14 NOTE — Assessment & Plan Note (Signed)
 A1c 6.0 on labs from November 2024.  He is currently prescribed Farxiga  10 mg daily.  Repeat A1c ordered today.

## 2024-03-14 NOTE — Assessment & Plan Note (Signed)
 Irregularly irregular rate and rhythm again on exam today.  He remains on renally dosed Eliquis  and Toprol -XL.

## 2024-03-14 NOTE — Assessment & Plan Note (Signed)
 Lipid panel updated in November 2024.  Total cholesterol 119 and LDL 46.  He remains on pravastatin  20 mg daily.  Refill provided today.  Repeat lipid panel ordered.

## 2024-03-14 NOTE — Progress Notes (Signed)
 Established Patient Office Visit  Subjective   Patient ID: [name], male    DOB: 11/11/1935  Age: 88 y.o. MRN: [id number]  Chief Complaint  Patient presents with   Follow-up   [name] returns to care today for routine follow-up.  He was last evaluated by me on 2/21.  No medication changes were made at that time and 107-month follow-up was arranged.  In the interim, he has been closely followed by vascular surgery in the setting of chronic venous insufficiency with a right lower extremity venous ulceration.  There have otherwise been no acute interval events.  Today he endorses chronic bilateral hand pain attributed to arthritis.  He is otherwise asymptomatic and has no acute concerns to discuss.  Past Medical History:  Diagnosis Date   Arteriosclerotic cardiovascular disease (ASCVD)    CABG in 1990s; 2002 total obstruction of LAD, CX and RCA with patent grafts and nl EF; Stress nuc. 2008 - mild LV dilation; normal EF; questionable small anteroapical scar; no ischemia   Arthritis    "left knee" (11/08/2015)   Atrial fibrillation (HCC)    Cellulitis 11/08/2015   "both feet"   Chronic anticoagulation    Dental crowns present    Diabetic peripheral neuropathy (HCC)    bilateral lower leg, hands   History of blood transfusion    "related to my leg surgery?"   Hypertension    states under control with meds., has been on med. x "long time"   Iron deficiency anemia    Jaw cancer (HCC) 1990s   "squamous cell"   Myocardial infarction (HCC) 1990s   "after my jaw OR"   Pedal edema    "not a lot", per pt.   Peripheral vascular disease (HCC)    Presence of retained hardware 07/2015   failed hardware jaw   Runny nose 07/27/2015   clear drainage, per pt.   Stroke (HCC)    Type II diabetes mellitus (HCC)    "diet controlled" (11/08/2015)   Past Surgical History:  Procedure Laterality Date   ABDOMINAL AORTOGRAM W/LOWER EXTREMITY Bilateral 08/20/2020   Procedure: ABDOMINAL  AORTOGRAM W/LOWER EXTREMITY;  Surgeon: Dannis Dy, MD;  Location: [facility] INVASIVE CV LAB;  Service: Cardiovascular;  Laterality: Bilateral;   ABDOMINAL AORTOGRAM W/LOWER EXTREMITY Right 10/31/2022   Procedure: ABDOMINAL AORTOGRAM W/LOWER EXTREMITY;  Surgeon: Margherita Shell, MD;  Location: [facility] INVASIVE CV LAB;  Service: Cardiovascular;  Laterality: Right;   AMPUTATION Left 11/11/2015   Procedure: LEFT ABOVE KNEE AMPUTATION;  Surgeon: Dannis Dy, MD;  Location: [facility] OR;  Service: Vascular;  Laterality: Left;   CARDIAC CATHETERIZATION  1990s; 04/16/2001   CATARACT EXTRACTION W/ INTRAOCULAR LENS  IMPLANT, BILATERAL Bilateral 09/2015   COLONOSCOPY N/A 11/27/2014   Procedure: COLONOSCOPY;  Surgeon: Ruby Corporal, MD;  [facility];  Service: Endoscopy;  Laterality: N/A;  830   CORONARY ARTERY BYPASS GRAFT  1990's   "CABG X4"   ESOPHAGOGASTRODUODENOSCOPY N/A 02/14/2018   Procedure: ESOPHAGOGASTRODUODENOSCOPY (EGD);  Surgeon: Ruby Corporal, MD;  [facility];  Service: Endoscopy;  Laterality: N/A;   ESOPHAGOGASTRODUODENOSCOPY (EGD) WITH PROPOFOL  N/A 02/25/2018   Procedure: ESOPHAGOGASTRODUODENOSCOPY (EGD) WITH PROPOFOL ;  Surgeon: Ruby Corporal, MD;  [facility];  Service: Endoscopy;  Laterality: N/A;   FRACTURE SURGERY     GIVENS CAPSULE STUDY N/A 02/15/2018   Procedure: GIVENS CAPSULE STUDY;  Surgeon: Ruby Corporal, MD;  [facility]: [facility];  Service: Endoscopy;  Laterality:  N/A;   GIVENS CAPSULE STUDY N/A 03/27/2018   Procedure: GIVENS CAPSULE STUDY;  Surgeon: Ruby Corporal, MD;  [facility];  Service: Endoscopy;  Laterality: N/A;   I & D EXTREMITY Left 11/10/2015   Procedure: INCISION AND DRAINAGE LEFT FOOT, AMPUTATION OF LEFT THIRD TOE;  Surgeon: Arvil Lauber, MD;  Location: [facility] OR;  Service: Vascular;  Laterality: Left;   INCISION AND DRAINAGE ABSCESS Left 06/16/2005   wide exc. abscess 5th toe   IR ANGIOGRAM VISCERAL SELECTIVE   03/28/2018   IR ANGIOGRAM VISCERAL SELECTIVE  03/28/2018   IR CT HEAD LTD  01/04/2019   IR PERCUTANEOUS ART THROMBECTOMY/INFUSION INTRACRANIAL INC DIAG ANGIO  01/04/2019   IR US  GUIDE VASC ACCESS LEFT  03/28/2018   MANDIBULAR HARDWARE REMOVAL Left 08/02/2015   Procedure:  HARDWARE REMOVAL TWO MANDIBULAR SCREWS;  Surgeon: Lyell Samuel, DDS;  Location: [facility];  Service: Oral Surgery;  Laterality: Left;   ORIF TIBIA FRACTURE Left ~ 1970   PERIPHERAL VASCULAR CATHETERIZATION N/A 08/14/2016   Procedure: Abdominal Aortogram w/Lower Extremity;  Surgeon: Dannis Dy, MD;  Location: [facility] INVASIVE CV LAB;  Service: Cardiovascular;  Laterality: N/A;   PERIPHERAL VASCULAR INTERVENTION Bilateral 08/20/2020   Procedure: PERIPHERAL VASCULAR INTERVENTION;  Surgeon: Dannis Dy, MD;  Location: [facility] INVASIVE CV LAB;  Service: Cardiovascular;  Laterality: Bilateral;  iliac stents   RADIOLOGY WITH ANESTHESIA N/A 01/04/2019   Procedure: CODE STROKE;  Surgeon: Radiologist, Medication, MD;  Location: [facility] OR;  Service: Radiology;  Laterality: N/A;   SQUAMOUS CELL CARCINOMA EXCISION Left 1993   "took my jaw out; got cadavar in there now"   TONSILLECTOMY     Social History   Tobacco Use   Smoking status: Never    Passive exposure: Never   Smokeless tobacco: Never  Vaping Use   Vaping status: Never Used  Substance Use Topics   Alcohol  use: No    Alcohol /week: 0.0 standard drinks of alcohol    Drug use: No   Family History  Problem Relation Age of Onset   Heart disease Mother    Allergies  Allergen Reactions   Lipitor [Atorvastatin ] Other (See Comments)    SEVERE HEADACHE   Simvastatin  Other (See Comments)    MUSCLE ACHES   Cefuroxime Diarrhea   Penicillins Rash    Has patient had a PCN reaction causing immediate rash, facial/tongue/throat swelling, SOB or lightheadedness with hypotension: Yes Has patient had a PCN reaction causing severe rash involving mucus membranes or skin  necrosis: No Has patient had a PCN reaction that required hospitalization No Has patient had a PCN reaction occurring within the last 10 years: No If all of the above answers are "NO", then may proceed with Cephalosporin use.    Sulfa Antibiotics Rash    Tolerates silver  sulfadiazine  cream at home   Review of Systems  Constitutional:  Negative for chills and fever.  HENT:  Negative for sore throat.   Respiratory:  Negative for cough and shortness of breath.   Cardiovascular:  Negative for chest pain, palpitations and leg swelling.  Gastrointestinal:  Negative for abdominal pain, blood in stool, constipation, diarrhea, nausea and vomiting.  Genitourinary:  Negative for dysuria and hematuria.  Musculoskeletal:  Positive for joint pain (bilateral hand pain). Negative for myalgias.  Skin:  Negative for itching and rash.  Neurological:  Negative for dizziness and headaches.  Psychiatric/Behavioral:  Negative for depression and suicidal ideas.      Objective:  BP 138/72   Pulse 88   Ht 5\' 8"  (1.727 m)   Wt 176 lb 6 oz (80 kg)   SpO2 96%   BMI 26.82 kg/m  BP Readings from Last 3 Encounters:  03/14/24 138/72  03/13/24 (!) 149/68  02/05/24 (!) 170/78   Physical Exam Vitals reviewed.  Constitutional:      General: He is not in acute distress.    Appearance: Normal appearance. He is not ill-appearing.     Comments: Examined in wheelchair  HENT:     Head: Normocephalic and atraumatic.     Right Ear: External ear normal.     Left Ear: External ear normal.     Nose: Nose normal. No congestion or rhinorrhea.     Mouth/Throat:     Mouth: Mucous membranes are moist.     Pharynx: Oropharynx is clear.  Eyes:     General: No scleral icterus.    Extraocular Movements: Extraocular movements intact.     Conjunctiva/sclera: Conjunctivae normal.     Pupils: Pupils are equal, round, and reactive to light.  Cardiovascular:     Rate and Rhythm: Normal rate. Rhythm irregular.      Pulses: Normal pulses.     Heart sounds: Normal heart sounds. No murmur heard. Pulmonary:     Effort: Pulmonary effort is normal.     Breath sounds: Normal breath sounds. No wheezing, rhonchi or rales.  Abdominal:     General: Abdomen is flat. Bowel sounds are normal. There is no distension.     Palpations: Abdomen is soft.     Tenderness: There is no abdominal tenderness.  Musculoskeletal:        General: No swelling or deformity.     Cervical back: Normal range of motion.     Comments: Left AKA  Skin:    General: Skin is warm and dry.     Capillary Refill: Capillary refill takes less than 2 seconds.     Comments: Clean, dry, intact dressing on right lower leg  Neurological:     General: No focal deficit present.     Mental Status: He is alert and oriented to person, place, and time.     Motor: No weakness.  Psychiatric:        Mood and Affect: Mood normal.        Behavior: Behavior normal.        Thought Content: Thought content normal.   Last CBC Lab Results  Component Value Date   WBC 7.9 09/07/2023   HGB 11.9 (L) 09/07/2023   HCT 36.0 (L) 09/07/2023   MCV 99 (H) 09/07/2023   MCH 32.6 09/07/2023   RDW 13.5 09/07/2023   PLT 251 09/07/2023   Last metabolic panel Lab Results  Component Value Date   GLUCOSE 166 (H) 09/07/2023   NA 142 09/07/2023   K 4.6 09/07/2023   CL 101 09/07/2023   CO2 23 09/07/2023   BUN 42 (H) 09/07/2023   CREATININE 2.00 (H) 09/07/2023   EGFR 32 (L) 09/07/2023   CALCIUM  9.2 09/07/2023   PHOS 3.7 08/13/2022   PROT 6.2 (L) 08/11/2022   ALBUMIN  2.8 (L) 08/13/2022   BILITOT 0.7 08/11/2022   ALKPHOS 184 (H) 08/11/2022   AST 25 08/11/2022   ALT 28 08/11/2022   ANIONGAP 13 08/14/2022   Last lipids Lab Results  Component Value Date   CHOL 119 09/07/2023   HDL 45 09/07/2023   LDLCALC 46 09/07/2023   TRIG 168 (H) 09/07/2023  CHOLHDL 2.6 09/07/2023   Last hemoglobin A1c Lab Results  Component Value Date   HGBA1C 6.0 (H) 09/07/2023    Last thyroid functions Lab Results  Component Value Date   TSH 4.460 09/07/2023   Last vitamin D  Lab Results  Component Value Date   VD25OH 50.4 09/07/2023   Last vitamin B12 and Folate Lab Results  Component Value Date   VITAMINB12 443 09/07/2023   FOLATE 18.2 09/07/2023     Assessment & Plan:   Problem List Items Addressed This Visit       Essential hypertension - Primary   Remains adequately controlled on current antihypertensive regimen.  No medication changes are indicated today.      Permanent atrial fibrillation (HCC)   Irregularly irregular rate and rhythm again on exam today.  He remains on renally dosed Eliquis  and Toprol -XL.      PVD (peripheral vascular disease) with claudication (HCC)   Closely followed by vascular surgery in the setting of PVD with right lower extremity wounds.  The wounds are almost completely healed.  He continues to receive routine wound care.      Type 2 diabetes mellitus with diabetic chronic kidney disease (HCC)   A1c 6.0 on labs from November 2024.  He is currently prescribed Farxiga  10 mg daily.  Repeat A1c ordered today.      CKD stage 3b, GFR 30-44 ml/min (HCC)   Stable on labs from November 2024.  He remains on Farxiga .  Repeat labs ordered today.      Dyslipidemia   Lipid panel updated in November 2024.  Total cholesterol 119 and LDL 46.  He remains on pravastatin  20 mg daily.  Refill provided today.  Repeat lipid panel ordered.      Return in about 3 months (around 06/14/2024).   Tobi Fortes, MD

## 2024-03-14 NOTE — Patient Instructions (Signed)
 It was a pleasure to see you today.  Thank you for giving [place] the opportunity to be involved in your care.  Below is a brief recap of your visit and next steps.  We will plan to see you again in 3 months.  Summary No medication changes today Follow up in 3 months

## 2024-03-14 NOTE — Assessment & Plan Note (Signed)
 Stable on labs from November 2024.  He remains on Farxiga .  Repeat labs ordered today.

## 2024-03-14 NOTE — Assessment & Plan Note (Signed)
 Closely followed by vascular surgery in the setting of PVD with right lower extremity wounds.  The wounds are almost completely healed.  He continues to receive routine wound care.

## 2024-03-15 LAB — HEMOGLOBIN A1C
Est. average glucose Bld gHb Est-mCnc: 140 mg/dL
Hgb A1c MFr Bld: 6.5 % — ABNORMAL HIGH (ref 4.8–5.6)

## 2024-03-15 LAB — CBC WITH DIFFERENTIAL/PLATELET
Basophils Absolute: 0.1 10*3/uL (ref 0.0–0.2)
Basos: 1 %
EOS (ABSOLUTE): 0.5 10*3/uL — ABNORMAL HIGH (ref 0.0–0.4)
Eos: 7 %
Hematocrit: 36.7 % — ABNORMAL LOW (ref 37.5–51.0)
Hemoglobin: 12.3 g/dL — ABNORMAL LOW (ref 13.0–17.7)
Immature Grans (Abs): 0 10*3/uL (ref 0.0–0.1)
Immature Granulocytes: 1 %
Lymphocytes Absolute: 1.4 10*3/uL (ref 0.7–3.1)
Lymphs: 23 %
MCH: 33.2 pg — ABNORMAL HIGH (ref 26.6–33.0)
MCHC: 33.5 g/dL (ref 31.5–35.7)
MCV: 99 fL — ABNORMAL HIGH (ref 79–97)
Monocytes Absolute: 0.6 10*3/uL (ref 0.1–0.9)
Monocytes: 10 %
Neutrophils Absolute: 3.6 10*3/uL (ref 1.4–7.0)
Neutrophils: 58 %
Platelets: 285 10*3/uL (ref 150–450)
RBC: 3.7 x10E6/uL — ABNORMAL LOW (ref 4.14–5.80)
RDW: 13.7 % (ref 11.6–15.4)
WBC: 6.2 10*3/uL (ref 3.4–10.8)

## 2024-03-15 LAB — CMP14+EGFR
ALT: 9 IU/L (ref 0–44)
AST: 14 IU/L (ref 0–40)
Albumin: 3.7 g/dL (ref 3.7–4.7)
Alkaline Phosphatase: 87 IU/L (ref 44–121)
BUN/Creatinine Ratio: 25 — ABNORMAL HIGH (ref 10–24)
BUN: 50 mg/dL — ABNORMAL HIGH (ref 8–27)
Bilirubin Total: 0.3 mg/dL (ref 0.0–1.2)
CO2: 24 mmol/L (ref 20–29)
Calcium: 9 mg/dL (ref 8.6–10.2)
Chloride: 99 mmol/L (ref 96–106)
Creatinine, Ser: 1.97 mg/dL — ABNORMAL HIGH (ref 0.76–1.27)
Globulin, Total: 2.8 g/dL (ref 1.5–4.5)
Glucose: 120 mg/dL — ABNORMAL HIGH (ref 70–99)
Potassium: 4.7 mmol/L (ref 3.5–5.2)
Sodium: 139 mmol/L (ref 134–144)
Total Protein: 6.5 g/dL (ref 6.0–8.5)
eGFR: 33 mL/min/{1.73_m2} — ABNORMAL LOW (ref >59)

## 2024-03-15 LAB — LIPID PANEL
Chol/HDL Ratio: 3.8 ratio (ref 0.0–5.0)
Cholesterol, Total: 127 mg/dL (ref 100–199)
HDL: 33 mg/dL — ABNORMAL LOW (ref >39)
LDL Chol Calc (NIH): 60 mg/dL (ref 0–99)
Triglycerides: 207 mg/dL — ABNORMAL HIGH (ref 0–149)
VLDL Cholesterol Cal: 34 mg/dL (ref 5–40)

## 2024-03-15 LAB — VITAMIN D 25 HYDROXY (VIT D DEFICIENCY, FRACTURES): Vit D, 25-Hydroxy: 60.6 ng/mL (ref 30.0–100.0)

## 2024-03-15 LAB — TSH+FREE T4
Free T4: 1.05 ng/dL (ref 0.82–1.77)
TSH: 3.85 u[IU]/mL (ref 0.450–4.500)

## 2024-03-15 LAB — B12 AND FOLATE PANEL
Folate: 8.6 ng/mL (ref >3.0)
Vitamin B-12: 345 pg/mL (ref 232–1245)

## 2024-03-17 ENCOUNTER — Ambulatory Visit: Payer: Self-pay | Admitting: Internal Medicine

## 2024-04-28 NOTE — Progress Notes (Unsigned)
 Office Note     CC:  follow up Requesting Provider:  Melvenia Manus BRAVO, MD  HPI: [name] is a 88 y.o. (1936-09-17) male who presents for reevaluation of right leg venous ulcerations.  He has a history of a left AKA.  He is receiving Unna boot changes on a weekly basis by [facility].  He has a history of bilateral common iliac artery stenting by Dr. Melvenia in 2021.  In January 2024 he underwent angiogram demonstrating widely patent iliacs with mild right SFA stenosis.  He denies tobacco use.  He is on Eliquis  for atrial fibrillation.  ***   Past Medical History:  Diagnosis Date   Arteriosclerotic cardiovascular disease (ASCVD)    CABG in 1990s; 2002 total obstruction of LAD, CX and RCA with patent grafts and nl EF; Stress nuc. 2008 - mild LV dilation; normal EF; questionable small anteroapical scar; no ischemia   Arthritis    left knee (11/08/2015)   Atrial fibrillation (HCC)    Cellulitis 11/08/2015   both feet   Chronic anticoagulation    Dental crowns present    Diabetic peripheral neuropathy (HCC)    bilateral lower leg, hands   History of blood transfusion    related to my leg surgery?   Hypertension    states under control with meds., has been on med. x long time   Iron deficiency anemia    Jaw cancer (HCC) 1990s   squamous cell   Myocardial infarction (HCC) 1990s   after my jaw OR   Pedal edema    not a lot, per pt.   Peripheral vascular disease (HCC)    Presence of retained hardware 07/2015   failed hardware jaw   Runny nose 07/27/2015   clear drainage, per pt.   Stroke (HCC)    Type II diabetes mellitus (HCC)    diet controlled (11/08/2015)    Past Surgical History:  Procedure Laterality Date   ABDOMINAL AORTOGRAM W/LOWER EXTREMITY Bilateral 08/20/2020   Procedure: ABDOMINAL AORTOGRAM W/LOWER EXTREMITY;  Surgeon: Eliza Lonni RAMAN, MD;  Location: [facility] INVASIVE CV LAB;  Service: Cardiovascular;  Laterality: Bilateral;    ABDOMINAL AORTOGRAM W/LOWER EXTREMITY Right 10/31/2022   Procedure: ABDOMINAL AORTOGRAM W/LOWER EXTREMITY;  Surgeon: Serene Gaile ORN, MD;  Location: [facility] INVASIVE CV LAB;  Service: Cardiovascular;  Laterality: Right;   AMPUTATION Left 11/11/2015   Procedure: LEFT ABOVE KNEE AMPUTATION;  Surgeon: Lonni RAMAN Eliza, MD;  Location: [facility] OR;  Service: Vascular;  Laterality: Left;   CARDIAC CATHETERIZATION  1990s; 04/16/2001   CATARACT EXTRACTION W/ INTRAOCULAR LENS  IMPLANT, BILATERAL Bilateral 09/2015   COLONOSCOPY N/A 11/27/2014   Procedure: COLONOSCOPY;  Surgeon: Claudis RAYMOND Rivet, MD;  Location: [facility];  Service: Endoscopy;  Laterality: N/A;  830   CORONARY ARTERY BYPASS GRAFT  1990's   CABG X4   ESOPHAGOGASTRODUODENOSCOPY N/A 02/14/2018   Procedure: ESOPHAGOGASTRODUODENOSCOPY (EGD);  Surgeon: Rivet Claudis RAYMOND, MD;  Location: [facility];  Service: Endoscopy;  Laterality: N/A;   ESOPHAGOGASTRODUODENOSCOPY (EGD) WITH PROPOFOL  N/A 02/25/2018   Procedure: ESOPHAGOGASTRODUODENOSCOPY (EGD) WITH PROPOFOL ;  Surgeon: Rivet Claudis RAYMOND, MD;  [facility]: [facility];  Service: Endoscopy;  Laterality: N/A;   FRACTURE SURGERY     GIVENS CAPSULE STUDY N/A 02/15/2018   Procedure: GIVENS CAPSULE STUDY;  Surgeon: Rivet Claudis RAYMOND, MD;  [facility]: [facility];  Service: Endoscopy;  Laterality: N/A;   GIVENS CAPSULE STUDY N/A 03/27/2018   Procedure: GIVENS CAPSULE STUDY;  Surgeon: Rivet Claudis  U, MD;  Location: [facility];  Service: Endoscopy;  Laterality: N/A;   I & D EXTREMITY Left 11/10/2015   Procedure: INCISION AND DRAINAGE LEFT FOOT, AMPUTATION OF LEFT THIRD TOE;  Surgeon: Redell LITTIE Door, MD;  Location: [facility] OR;  Service: Vascular;  Laterality: Left;   INCISION AND DRAINAGE ABSCESS Left 06/16/2005   wide exc. abscess 5th toe   IR ANGIOGRAM VISCERAL SELECTIVE  03/28/2018   IR ANGIOGRAM VISCERAL SELECTIVE  03/28/2018   IR CT HEAD LTD  01/04/2019   IR PERCUTANEOUS ART THROMBECTOMY/INFUSION INTRACRANIAL INC DIAG  ANGIO  01/04/2019   IR US  GUIDE VASC ACCESS LEFT  03/28/2018   MANDIBULAR HARDWARE REMOVAL Left 08/02/2015   Procedure:  HARDWARE REMOVAL TWO MANDIBULAR SCREWS;  Surgeon: Ezzie Pinal, DDS;  Location: [facility];  Service: Oral Surgery;  Laterality: Left;   ORIF TIBIA FRACTURE Left ~ 1970   PERIPHERAL VASCULAR CATHETERIZATION N/A 08/14/2016   Procedure: Abdominal Aortogram w/Lower Extremity;  Surgeon: Lonni GORMAN Blade, MD;  Location: [facility] INVASIVE CV LAB;  Service: Cardiovascular;  Laterality: N/A;   PERIPHERAL VASCULAR INTERVENTION Bilateral 08/20/2020   Procedure: PERIPHERAL VASCULAR INTERVENTION;  Surgeon: Blade Lonni GORMAN, MD;  Location: [facility] INVASIVE CV LAB;  Service: Cardiovascular;  Laterality: Bilateral;  iliac stents   RADIOLOGY WITH ANESTHESIA N/A 01/04/2019   Procedure: CODE STROKE;  Surgeon: Radiologist, Medication, MD;  Location: [facility] OR;  Service: Radiology;  Laterality: N/A;   SQUAMOUS CELL CARCINOMA EXCISION Left 1993   took my jaw out; got cadavar in there now   TONSILLECTOMY      Social History   Socioeconomic History   Marital status: Married    Spouse name: Not on file   Number of children: Not on file   Years of education: Not on file   Highest education level: Not on file  Occupational History   Not on file  Tobacco Use   Smoking status: Never    Passive exposure: Never   Smokeless tobacco: Never  Vaping Use   Vaping status: Never Used  Substance and Sexual Activity   Alcohol  use: No    Alcohol /week: 0.0 standard drinks of alcohol    Drug use: No   Sexual activity: Yes  Other Topics Concern   Not on file  Social History Narrative   Not on file   Social Drivers of Health   Financial Resource Strain: Not on file  Food Insecurity: No Food Insecurity (08/11/2022)   Hunger Vital Sign    Worried About Running Out of Food in the Last Year: Never true    Ran Out of Food in the Last Year: Never true  Transportation Needs: No Transportation  Needs (08/11/2022)   PRAPARE - Administrator, Civil Service (Medical): No    Lack of Transportation (Non-Medical): No  Physical Activity: Not on file  Stress: Not on file  Social Connections: Not on file  Intimate Partner Violence: Not At Risk (08/11/2022)   Humiliation, Afraid, Rape, and Kick questionnaire    Fear of Current or Ex-Partner: No    Emotionally Abused: No    Physically Abused: No    Sexually Abused: No    Family History  Problem Relation Age of Onset   Heart disease Mother     Current Outpatient Medications  Medication Sig Dispense Refill   acetaminophen  (TYLENOL ) 325 MG tablet Take 2 tablets (650 mg total) by mouth every 6 (six) hours as needed for moderate pain. (Patient taking differently: Take 1,000  mg by mouth every 6 (six) hours as needed for moderate pain (pain score 4-6).) 30 tablet 0   apixaban  (ELIQUIS ) 2.5 MG TABS tablet Take 1 tablet (2.5 mg total) by mouth 2 (two) times daily. 180 tablet 3   augmented betamethasone dipropionate (DIPROLENE-AF) 0.05 % cream Apply topically.     calcium -vitamin D  (OSCAL WITH D) 500-5 MG-MCG tablet Take 2 tablets by mouth 2 (two) times daily. 60 tablet 2   carboxymethylcellul-glycerin  (OPTIVE) 0.5-0.9 % ophthalmic solution Place 1 drop into both eyes daily as needed for dry eyes.     cetirizine (ZYRTEC) 10 MG tablet Take 10 mg by mouth every morning.     cholecalciferol  (VITAMIN D3) 25 MCG (1000 UNIT) tablet Take 2,000 Units by mouth daily.     dapagliflozin  propanediol (FARXIGA ) 10 MG TABS tablet Take 1 tablet (10 mg total) by mouth daily. 90 tablet 3   feeding supplement (BOOST / RESOURCE BREEZE) LIQD Take 1 Container by mouth 2 (two) times daily between meals. (Patient taking differently: Take 237 mLs by mouth 2 (two) times daily between meals. Ensue)     furosemide  (LASIX ) 40 MG tablet Take 1 tablet (40 mg total) by mouth daily. 30 tablet 3   magnesium  oxide (MAG-OX) 400 (240 Mg) MG tablet Take 1 tablet (400 mg  total) by mouth daily. 30 tablet 3   metoprolol  succinate (TOPROL  XL) 25 MG 24 hr tablet Take 1 tablet (25 mg total) by mouth daily. 90 tablet 3   Omega-3 Fatty Acids  (FISH OIL) 1200 MG CAPS Take 1,200 mg by mouth every morning.     Pediatric Multivitamins-Iron (FLINTSTONES PLUS IRON PO) Take 1 tablet by mouth daily.     potassium chloride  SA (KLOR-CON  M) 20 MEQ tablet Take 2 tablets (40 mEq total) by mouth daily. 60 tablet 3   pravastatin  (PRAVACHOL ) 20 MG tablet Take 1 tablet (20 mg total) by mouth every evening. 30 tablet 3   Probiotic Product (PHILLIPS COLON HEALTH PO) Take 1 capsule by mouth daily.      silver  sulfADIAZINE  (SILVADENE ) 1 % cream Apply 1 application topically daily. (Patient taking differently: Apply 1 application  topically daily. Between toes)     sodium bicarbonate  650 MG tablet Take 1,300 mg by mouth 2 (two) times daily.     No current facility-administered medications for this visit.    Allergies  Allergen Reactions   Lipitor [Atorvastatin ] Other (See Comments)    SEVERE HEADACHE   Simvastatin  Other (See Comments)    MUSCLE ACHES   Cefuroxime Diarrhea   Penicillins Rash    Has patient had a PCN reaction causing immediate rash, facial/tongue/throat swelling, SOB or lightheadedness with hypotension: Yes Has patient had a PCN reaction causing severe rash involving mucus membranes or skin necrosis: No Has patient had a PCN reaction that required hospitalization No Has patient had a PCN reaction occurring within the last 10 years: No If all of the above answers are NO, then may proceed with Cephalosporin use.    Sulfa Antibiotics Rash    Tolerates silver  sulfadiazine  cream at home     REVIEW OF SYSTEMS:   [X]  denotes positive finding, [ ]  denotes negative finding Cardiac  Comments:  Chest pain or chest pressure:    Shortness of breath upon exertion:    Short of breath when lying flat:    Irregular heart rhythm:        Vascular    Pain in calf, thigh, or  hip brought on by ambulation:  Pain in feet at night that wakes you up from your sleep:     Blood clot in your veins:    Leg swelling:         Pulmonary    Oxygen at home:    Productive cough:     Wheezing:         Neurologic    Sudden weakness in arms or legs:     Sudden numbness in arms or legs:     Sudden onset of difficulty speaking or slurred speech:    Temporary loss of vision in one eye:     Problems with dizziness:         Gastrointestinal    Blood in stool:     Vomited blood:         Genitourinary    Burning when urinating:     Blood in urine:        Psychiatric    Major depression:         Hematologic    Bleeding problems:    Problems with blood clotting too easily:        Skin    Rashes or ulcers:        Constitutional    Fever or chills:      PHYSICAL EXAMINATION:  There were no vitals filed for this visit.  General:  WDWN in NAD; vital signs documented above Gait: Not observed HENT: WNL, normocephalic Pulmonary: normal non-labored breathing Cardiac: irregular HR Abdomen: soft, NT, no masses Skin: without rashes Vascular Exam/Pulses: *** Extremities: {With/Without:20273} ischemic changes, {With/Without:20273} Gangrene , {With/Without:20273} cellulitis; {With/Without:20273} open wounds;  Musculoskeletal: no muscle wasting or atrophy  Neurologic: A&O X 3 Psychiatric:  The pt has Normal affect.   Non-Invasive Vascular Imaging:   ***    ASSESSMENT/PLAN:: 88 y.o. male here for follow up for wound check RLE venous ulcers   -***   Donnice Sender, PA-C [facility] [phone]

## 2024-04-29 ENCOUNTER — Ambulatory Visit (INDEPENDENT_AMBULATORY_CARE_PROVIDER_SITE_OTHER): Admitting: Physician Assistant

## 2024-04-29 VITALS — BP 166/64 | HR 76 | Ht 68.0 in | Wt 176.0 lb

## 2024-04-29 DIAGNOSIS — I739 Peripheral vascular disease, unspecified: Secondary | ICD-10-CM | POA: Diagnosis not present

## 2024-04-29 DIAGNOSIS — I872 Venous insufficiency (chronic) (peripheral): Secondary | ICD-10-CM | POA: Diagnosis not present

## 2024-04-30 ENCOUNTER — Other Ambulatory Visit: Payer: Self-pay

## 2024-04-30 DIAGNOSIS — L97909 Non-pressure chronic ulcer of unspecified part of unspecified lower leg with unspecified severity: Secondary | ICD-10-CM

## 2024-05-06 ENCOUNTER — Other Ambulatory Visit: Payer: Self-pay

## 2024-05-06 ENCOUNTER — Telehealth: Payer: Self-pay

## 2024-05-06 DIAGNOSIS — I5030 Unspecified diastolic (congestive) heart failure: Secondary | ICD-10-CM

## 2024-05-06 MED ORDER — POTASSIUM CHLORIDE CRYS ER 20 MEQ PO TBCR: 40.0000 meq | EXTENDED_RELEASE_TABLET | Freq: Every day | ORAL | 1 refills | Status: DC

## 2024-05-06 NOTE — Telephone Encounter (Signed)
Advised with verbal understanding

## 2024-05-06 NOTE — Telephone Encounter (Signed)
 Patient is asking for 90 day supply  Prescription Request  05/06/2024  LOV: Visit date not found  What is the name of the medication or equipment? potassium chloride  SA (KLOR-CON  M) 20 MEQ tablet [[id number]]  requesting a 9- day supply  Have you contacted your pharmacy to request a refill? Yes   Which pharmacy would you like this sent to?  [facility]  Patient notified that their request is being sent to the clinical staff for review and that they should receive a response within 2 business days.   Please advise at spouse walked into office

## 2024-05-15 ENCOUNTER — Telehealth: Payer: Self-pay

## 2024-05-15 NOTE — Telephone Encounter (Signed)
 [name], RN [facility] called to report pt's skin under his NONIE Seaman is red and has small areas of weeping from his skin. [name] reported the main wound is healing and he is in no pain. [name] advised to place 2-layer wrap to the leg and monitor. [name] knows to call back if there are more changes or concerns.

## 2024-05-30 ENCOUNTER — Telehealth: Payer: Self-pay

## 2024-05-30 NOTE — Telephone Encounter (Signed)
 Prescription Request  05/30/2024  LOV: Visit date not found  What is the name of the medication or equipment? pravastatin  (PRAVACHOL ) 20 MG tablet [[id number]] (90 day supply)  Have you contacted your pharmacy to request a refill? Yes   [facility]   Patient notified that their request is being sent to the clinical staff for review and that they should receive a response within 2 business days.   Please advise at walked into office

## 2024-05-31 ENCOUNTER — Other Ambulatory Visit: Payer: Self-pay | Admitting: Internal Medicine

## 2024-06-02 NOTE — Telephone Encounter (Signed)
 Patient wife came by for update on refill request

## 2024-06-03 ENCOUNTER — Other Ambulatory Visit: Payer: Self-pay

## 2024-06-03 MED ORDER — PRAVASTATIN SODIUM 20 MG PO TABS
20.0000 mg | ORAL_TABLET | Freq: Every evening | ORAL | 3 refills | Status: DC
Start: 2024-06-03 — End: 2024-06-17

## 2024-06-03 NOTE — Telephone Encounter (Signed)
 Patient wife has came back by the office day 3 husband  needs his medicine.  pravastatin  (PRAVACHOL ) 20 MG   [facility]

## 2024-06-17 ENCOUNTER — Ambulatory Visit

## 2024-06-17 VITALS — BP 164/82 | HR 69 | Ht 69.0 in | Wt 170.0 lb

## 2024-06-17 DIAGNOSIS — E785 Hyperlipidemia, unspecified: Secondary | ICD-10-CM

## 2024-06-17 DIAGNOSIS — L03116 Cellulitis of left lower limb: Secondary | ICD-10-CM

## 2024-06-17 MED ORDER — PRAVASTATIN SODIUM 20 MG PO TABS: 20.0000 mg | ORAL_TABLET | Freq: Every evening | ORAL | 3 refills | Status: AC

## 2024-06-17 MED ORDER — SILVER SULFADIAZINE 1 % EX CREA: 1.0000 | TOPICAL_CREAM | Freq: Every day | CUTANEOUS | 2 refills | Status: AC

## 2024-06-17 NOTE — Progress Notes (Signed)
 Using  Established Patient Office Visit  Subjective   Patient ID: [name], male    DOB: September 26, 1936  Age: 88 y.o. MRN: [id number]  Chief Complaint  Patient presents with   Medical Management of Chronic Issues    Pt here for a 3 month follow up  PT wants to talk about his right leg, Pt currently has it wrapped ,   [facility]  20 MG, tablet every evening     HPI  Discussed the use of AI scribe software for clinical note transcription with the patient, who gave verbal consent to proceed.  History of Present Illness   [name] is an 88 year old male who presents with a leg infection and wound care management. He is accompanied by his wife.  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 - No recent exposure to lakes or oceans  Wound care management - Applies Vaseline to affected areas - Silvadene  cream available but not used recently - Receives weekly wound care from nurses every Thursday, including dressing changes and showering on that day - Sponge baths performed on other days for hygiene maintenance  Past traumatic injury - History of significant leg injury sustained during [employer]state softball participation years ago       Patient Active Problem List   Diagnosis Date Noted   Chronic venous insufficiency 12/25/2023   Type 2 diabetes mellitus with diabetic chronic kidney disease (HCC) 12/14/2023   Need for influenza vaccination 09/07/2023   CKD stage 3b, GFR 30-44 ml/min (HCC) 06/07/2023   Prolonged QT interval 08/11/2022   Mixed hyperlipidemia 08/11/2022    Congestive heart failure (CHF) (HCC) 08/10/2022   Hypokalemia 06/23/2022   Hypomagnesemia 06/23/2022   Hypocalcemia 06/23/2022   Hyperphosphatemia 06/23/2022   Pure hypercholesterolemia 08/22/2021   Stroke (HCC) 01/04/2019   Stroke (cerebrum) (HCC) 01/04/2019   Middle cerebral artery embolism, left (HCC) s/p mechanical thrombectomy 01/04/2019   Gastrointestinal hemorrhage    Anemia 03/26/2018   Melena 02/21/2018   Upper GI bleed 02/13/2018   Coagulopathy (HCC) 02/13/2018   Weight gain 11/30/2015   Coronary artery disease involving coronary bypass graft of native heart without angina pectoris    Tachycardia    Leukocytosis    Abnormality of gait    Acute blood loss anemia    Acute pulmonary edema (HCC)    Acute congestive heart failure (HCC)    Acute respiratory failure with hypoxia (HCC)    Pressure ulcer 11/09/2015   Necrotic toes (HCC) 11/08/2015   Lower extremity cellulitis 11/08/2015   Bradycardia 05/28/2015   PVD (peripheral vascular disease) with claudication (HCC) 09/30/2014   Peripheral vascular disease (HCC) 08/06/2012   Chronic anticoagulation 01/20/2011   Prediabetes 03/04/2010   Dyslipidemia 03/04/2010   Essential hypertension 03/04/2010   Hx of CABG 03/04/2010   Permanent atrial fibrillation (HCC) 04/08/2009    ROS    Objective:     BP (!) 164/82   Pulse 69   Ht 5' 9 (1.753 m)   Wt 170 lb (77.1 kg)   SpO2 94%   BMI 25.10 kg/m  BP Readings from  Last 3 Encounters:  06/17/24 (!) 164/82  04/29/24 (!) 166/64  03/14/24 138/72   Wt Readings from Last 3 Encounters:  06/17/24 170 lb (77.1 kg)  04/29/24 176 lb (79.8 kg)  03/14/24 176 lb 6 oz (80 kg)     Physical Exam Vitals reviewed.  Constitutional:      General: He is not in acute distress.    Appearance: Normal appearance. He is not ill-appearing.     Comments: Examined in wheelchair  HENT:     Head: Normocephalic and atraumatic.     Right Ear: External ear normal.     Left Ear: External  ear normal.     Nose: Nose normal. No congestion or rhinorrhea.     Mouth/Throat:     Mouth: Mucous membranes are moist.     Pharynx: Oropharynx is clear.  Eyes:     General: No scleral icterus.    Extraocular Movements: Extraocular movements intact.     Conjunctiva/sclera: Conjunctivae normal.     Pupils: Pupils are equal, round, and reactive to light.  Cardiovascular:     Rate and Rhythm: Normal rate. Rhythm irregular.     Pulses: Normal pulses.     Heart sounds: Normal heart sounds. No murmur heard. Pulmonary:     Effort: Pulmonary effort is normal.     Breath sounds: Normal breath sounds. No wheezing, rhonchi or rales.  Abdominal:     General: Abdomen is flat. Bowel sounds are normal. There is no distension.     Palpations: Abdomen is soft.     Tenderness: There is no abdominal tenderness.  Musculoskeletal:        General: No swelling or deformity.     Cervical back: Normal range of motion.     Comments: Left AKA  Skin:    General: Skin is warm and dry.     Capillary Refill: Capillary refill takes less than 2 seconds.     Comments: Clean, dry, intact dressing on right lower leg  Neurological:     General: No focal deficit present.     Mental Status: He is alert and oriented to person, place, and time.     Motor: No weakness.  Psychiatric:        Mood and Affect: Mood normal.        Behavior: Behavior normal.        Thought Content: Thought content normal.    No results found for any visits on 06/17/24.  Last CBC Lab Results  Component Value Date   WBC 6.2 03/14/2024   HGB 12.3 (L) 03/14/2024   HCT 36.7 (L) 03/14/2024   MCV 99 (H) 03/14/2024   MCH 33.2 (H) 03/14/2024   RDW 13.7 03/14/2024   PLT 285 03/14/2024   Last metabolic panel Lab Results  Component Value Date   GLUCOSE 120 (H) 03/14/2024   NA 139 03/14/2024   K 4.7 03/14/2024   CL 99 03/14/2024   CO2 24 03/14/2024   BUN 50 (H) 03/14/2024   CREATININE 1.97 (H) 03/14/2024   EGFR 33 (L) 03/14/2024    CALCIUM  9.0 03/14/2024   PHOS 3.7 08/13/2022   PROT 6.5 03/14/2024   ALBUMIN  3.7 03/14/2024   LABGLOB 2.8 03/14/2024   BILITOT 0.3 03/14/2024   ALKPHOS 87 03/14/2024   AST 14 03/14/2024   ALT 9 03/14/2024   ANIONGAP 13 08/14/2022   Last lipids Lab Results  Component Value Date   CHOL 127 03/14/2024   HDL 33 (L) 03/14/2024  LDLCALC 60 03/14/2024   TRIG 207 (H) 03/14/2024   CHOLHDL 3.8 03/14/2024   Last hemoglobin A1c Lab Results  Component Value Date   HGBA1C 6.5 (H) 03/14/2024   Last thyroid functions Lab Results  Component Value Date   TSH 3.850 03/14/2024   Last vitamin D  Lab Results  Component Value Date   VD25OH 60.6 03/14/2024   Last vitamin B12 and Folate Lab Results  Component Value Date   VITAMINB12 345 03/14/2024   FOLATE 8.6 03/14/2024     The ASCVD Risk score (Arnett DK, et al., 2019) failed to calculate for the following reasons:   The 2019 ASCVD risk score is only valid for ages 15 to 55   Risk score cannot be calculated because patient has a medical history suggesting prior/existing ASCVD    Assessment & Plan:   Problem List Items Addressed This Visit       Other   Dyslipidemia - Primary   Hyperlipidemia Hyperlipidemia managed with pravastatin . - Prescribe three-month supply of pravastatin  [facility] [address]      Relevant Medications   pravastatin  (PRAVACHOL ) 20 MG tablet   Lower extremity cellulitis   Chronic lower extremity wound with history of infection and amputation Chronic wound with history of infection and toe amputation due to staph and gaseous gangrene. Currently improving with intermittent breakouts. Wound care team involved weekly. - Use Silvadene  cream with light coating on wound. - Do not cover wound immediately after applying Silvadene  to allow air drying. - Continue weekly visits from wound care nurses for dressing changes.       Relevant Medications   silver  sulfADIAZINE  (SILVADENE ) 1  % cream         Return in about 3 months (around 09/17/2024) for chronic follow-up with PCP.    Leita Longs, FNP

## 2024-06-22 NOTE — Assessment & Plan Note (Signed)
 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 Currently improving with intermittent breakouts. Wound care team involved weekly. - Use Silvadene  cream with light coating on wound. - Do not cover wound immediately after applying Silvadene  to allow air drying. - Continue weekly visits from wound care nurses for dressing changes.

## 2024-06-22 NOTE — Assessment & Plan Note (Signed)
 Hyperlipidemia Hyperlipidemia managed with pravastatin . - Prescribe three-month supply of pravastatin  [facility] [address]

## 2024-07-10 ENCOUNTER — Telehealth: Payer: Self-pay

## 2024-07-10 NOTE — Telephone Encounter (Signed)
 [name], RN from [facility] called to report patient's increased drainage and redness to pt's right lower leg. Mitzie will continue wound care as ordered. [name] advised pt to go to ED if swelling and red increase indicative of cellulitis  Will try to move up pt's next appt.

## 2024-07-14 ENCOUNTER — Other Ambulatory Visit: Payer: Self-pay | Admitting: Physician Assistant

## 2024-07-17 ENCOUNTER — Telehealth: Payer: Self-pay

## 2024-07-17 NOTE — Telephone Encounter (Signed)
 Home Health/Triage:  -[name] from [facility] called stating that on her visit with pt his wounds were looking better and they were draining less.  She states he asked about why they haven't refilled the Doxycyline. Lastly, Mitzie was asking if his appt could be moved up.  -returned call to [facility], left VM stating a request was submitted for Doxycycline  but our office refused a refill and pt is booked in the [place] office on 10/8.   -Advised her to return call to the office about moving appt that she had talked to [place]..  If wounds are improving and will pt come to [place] office if appt is changed?

## 2024-07-24 ENCOUNTER — Other Ambulatory Visit: Payer: Self-pay | Admitting: Vascular Surgery

## 2024-07-24 ENCOUNTER — Telehealth: Payer: Self-pay

## 2024-07-24 DIAGNOSIS — I739 Peripheral vascular disease, unspecified: Secondary | ICD-10-CM

## 2024-07-24 NOTE — Telephone Encounter (Signed)
 [name], RN from [facility] called reporting increased drainage on pt's right lower leg from the knee down and is cold.  She reported a palpable pulse in the foot. She also reported a new ulcer on pt's 2nd toe on the right foot.  Top of the foot is open and red.  Appts made for ABI and PA visit 07/28/24 Patient is aware of these appts.

## 2024-07-28 ENCOUNTER — Ambulatory Visit (HOSPITAL_COMMUNITY)
Admission: RE | Admit: 2024-07-28 | Discharge: 2024-07-28 | Disposition: A | Discharge status: Home / Self Care | Source: Ambulatory Visit | Attending: Surgery | Admitting: Surgery

## 2024-07-28 ENCOUNTER — Ambulatory Visit: Admitting: Physician Assistant

## 2024-07-28 VITALS — BP 159/71 | HR 74 | Temp 97.7°F

## 2024-07-28 DIAGNOSIS — I739 Peripheral vascular disease, unspecified: Secondary | ICD-10-CM | POA: Insufficient documentation

## 2024-07-28 DIAGNOSIS — I83009 Varicose veins of unspecified lower extremity with ulcer of unspecified site: Secondary | ICD-10-CM

## 2024-07-28 DIAGNOSIS — L97909 Non-pressure chronic ulcer of unspecified part of unspecified lower leg with unspecified severity: Secondary | ICD-10-CM

## 2024-07-28 DIAGNOSIS — I70235 Atherosclerosis of native arteries of right leg with ulceration of other part of foot: Secondary | ICD-10-CM | POA: Diagnosis not present

## 2024-07-28 LAB — VAS US ABI WITH/WO TBI

## 2024-07-28 MED ORDER — DOXYCYCLINE HYCLATE 100 MG PO CAPS: 100.0000 mg | ORAL_CAPSULE | Freq: Two times a day (BID) | ORAL | 0 refills | Status: DC

## 2024-07-29 ENCOUNTER — Other Ambulatory Visit: Payer: Self-pay

## 2024-07-29 DIAGNOSIS — I70235 Atherosclerosis of native arteries of right leg with ulceration of other part of foot: Secondary | ICD-10-CM

## 2024-07-29 NOTE — H&P (View-Only) (Signed)
 Office Note   History of Present Illness   [name] is a 88 y.o. (06-29-1936) male who presents as a triage visit.  We follow the patient for mixed arterial disease and venous insufficiency.  He has a history of left AKA.  He also has a history of bilateral common iliac artery stenting by Dr. Eliza in 2021.  His stents were widely patent by angiogram in January 2024. Recently we have been following his right lower leg venous ulcerations. He is currently receiving unna boot changes to the right leg every week by [facility].   He returns today as a triage visit.  He was sent to our office for evaluation by his home health nurse.  About 2 weeks ago the patient's skin opened up on the top of his right foot.  He also developed a new ulceration to his second toe.  Around the same time his toes and forefoot were very red.  He says that his erythema has improved.  He does not think that his wound on his right second toe has gotten any better.  He denies any drainage from this area.  He does endorse clear drainage from his foot and right lower leg.  He denies any fevers.  Current Outpatient Medications  Medication Sig Dispense Refill   doxycycline  (VIBRAMYCIN ) 100 MG capsule Take 1 capsule (100 mg total) by mouth 2 (two) times daily. 14 capsule 0   acetaminophen  (TYLENOL ) 325 MG tablet Take 2 tablets (650 mg total) by mouth every 6 (six) hours as needed for moderate pain. (Patient taking differently: Take 1,000 mg by mouth every 6 (six) hours as needed for moderate pain (pain score 4-6).) 30 tablet 0   apixaban  (ELIQUIS ) 2.5 MG TABS tablet Take 1 tablet (2.5 mg total) by mouth 2 (two) times daily. 180 tablet 3   augmented betamethasone dipropionate (DIPROLENE-AF) 0.05 % cream Apply topically.     calcium -vitamin D  (OSCAL WITH D) 500-5 MG-MCG tablet Take 2 tablets by mouth 2 (two) times daily. 60 tablet 2   carboxymethylcellul-glycerin  (OPTIVE) 0.5-0.9 % ophthalmic solution Place 1  drop into both eyes daily as needed for dry eyes.     cetirizine (ZYRTEC) 10 MG tablet Take 10 mg by mouth every morning.     cholecalciferol  (VITAMIN D3) 25 MCG (1000 UNIT) tablet Take 2,000 Units by mouth daily.     dapagliflozin  propanediol (FARXIGA ) 10 MG TABS tablet Take 1 tablet (10 mg total) by mouth daily. 90 tablet 3   feeding supplement (BOOST / RESOURCE BREEZE) LIQD Take 1 Container by mouth 2 (two) times daily between meals. (Patient taking differently: Take 237 mLs by mouth 2 (two) times daily between meals. Ensue)     furosemide  (LASIX ) 40 MG tablet TAKE 1 TABLET BY MOUTH EVERY DAY 30 tablet 3   magnesium  oxide (MAG-OX) 400 (240 Mg) MG tablet Take 1 tablet (400 mg total) by mouth daily. 30 tablet 3   metoprolol  succinate (TOPROL  XL) 25 MG 24 hr tablet Take 1 tablet (25 mg total) by mouth daily. 90 tablet 3   Omega-3 Fatty Acids  (FISH OIL) 1200 MG CAPS Take 1,200 mg by mouth every morning.     Pediatric Multivitamins-Iron (FLINTSTONES PLUS IRON PO) Take 1 tablet by mouth daily.     potassium chloride  SA (KLOR-CON  M) 20 MEQ tablet Take 2 tablets (40 mEq total) by mouth daily. 180 tablet 1   pravastatin  (PRAVACHOL ) 20 MG tablet Take 1 tablet (20 mg total)  by mouth every evening. 90 tablet 3   Probiotic Product (PHILLIPS COLON HEALTH PO) Take 1 capsule by mouth daily.      silver  sulfADIAZINE  (SILVADENE ) 1 % cream Apply 1 Application topically daily. 50 g 2   sodium bicarbonate  650 MG tablet Take 1,300 mg by mouth 2 (two) times daily.     No current facility-administered medications for this visit.    REVIEW OF SYSTEMS (negative unless checked):   Cardiac:  []  Chest pain or chest pressure? []  Shortness of breath upon activity? []  Shortness of breath when lying flat? []  Irregular heart rhythm?  Vascular:  []  Pain in calf, thigh, or hip brought on by walking? []  Pain in feet at night that wakes you up from your sleep? []  Blood clot in your veins? [x]  Leg swelling?  Pulmonary:   []  Oxygen at home? []  Productive cough? []  Wheezing?  Neurologic:  []  Sudden weakness in arms or legs? []  Sudden numbness in arms or legs? []  Sudden onset of difficult speaking or slurred speech? []  Temporary loss of vision in one eye? []  Problems with dizziness?  Gastrointestinal:  []  Blood in stool? []  Vomited blood?  Genitourinary:  []  Burning when urinating? []  Blood in urine?  Psychiatric:  []  Major depression  Hematologic:  []  Bleeding problems? []  Problems with blood clotting?  Dermatologic:  []  Rashes or ulcers?  Constitutional:  []  Fever or chills?  Ear/Nose/Throat:  []  Change in hearing? []  Nose bleeds? []  Sore throat?  Musculoskeletal:  []  Back pain? []  Joint pain? []  Muscle pain?   Physical Examination   Vitals:   07/28/24 1026  BP: (!) 159/71  Pulse: 74  Temp: 97.7 F (36.5 C)  TempSrc: Temporal   There is no height or weight on file to calculate BMI.  General:  chronically ill appearing male in NAD Gait: Not observed HENT: WNL, normocephalic Pulmonary: normal non-labored breathing Cardiac: regular Abdomen: soft, NT, no masses Skin: without rashes Vascular Exam/Pulses: Palpable femoral pulses. Nonpalpable right popliteal or pedal pulses. Monophasic right DP/PT doppler signals Extremities: well healed left AKA. Right lower leg and foot with dry, flaky skin. Healing venous ulceration to the right calf. New ulceration to the right 2nd toe with macerated, erythematous skin on the forefoot  Musculoskeletal: no muscle wasting or atrophy  Neurologic: A&O X 3;  No focal weakness or paresthesias are detected Psychiatric:  The pt has Normal affect.         Non-Invasive Vascular imaging   ABI (07/28/2024) R:  ABI: [place] ([place]),  PT: mono DP: mono TBI:  0.32 L AKA   Medical Decision Making   [name] is a 88 y.o. male who presents as a triage visit  The patient presents to our clinic today as a triage visit for new tissue  loss in the right lower extremity.  Most recently we have been following the patient for venous ulcerations to the right lower leg. The patient states about 2 weeks ago he developed some blistering to the top of his right foot, which subsequently opened up and has had clear drainage.  Around the same time he developed erythema of his forefoot and a new second toe ulceration.  He feels like his erythema has improved, however his second toe wound appears the same. He has been receiving weekly Unna boot changes by [facility].  His previous venous ulcerations have significantly improved. On exam he has palpable femoral pulses.  He has a nonpalpable right popliteal pulse.  He has nonpalpable right pedal pulses.  He does have monophasic right DP/PT Doppler signals.  He has macerated skin on his right forefoot and toes with an arterial ulceration to his right second toe.  His right foot is erythematous with concern for cellulitis. ABIs performed today are noncompressible.  His right great toe pressure is which is not adequate for wound healing. I have explained to the patient that he would benefit from abdominal aortogram with right lower extremity runoff and possible intervention.  Hopefully we can improve his blood flow in the right leg so he can heal his right second toe wound.  He does have palpable femoral pulses, which would suggest that his previous iliac stents are patent.  Intervention would likely be on the SFA, popliteal, and/or tibial vessels.  His Eliquis  will have to be held for his angiogram.  We will plan for right lower extremity angiogram the left common femoral access within the next 1 to 2 weeks with Dr. Serene.  I will also place the patient on a 1 week course of doxycycline  for possible cellulitis   Ahmed Holster PA-C [facility] Office: [phone]  Clinic MD: Serene

## 2024-07-29 NOTE — Progress Notes (Signed)
 Office Note   History of Present Illness   [name] is a 88 y.o. (06-29-1936) male who presents as a triage visit.  We follow the patient for mixed arterial disease and venous insufficiency.  He has a history of left AKA.  He also has a history of bilateral common iliac artery stenting by Dr. Eliza in 2021.  His stents were widely patent by angiogram in January 2024. Recently we have been following his right lower leg venous ulcerations. He is currently receiving unna boot changes to the right leg every week by [facility].   He returns today as a triage visit.  He was sent to our office for evaluation by his home health nurse.  About 2 weeks ago the patient's skin opened up on the top of his right foot.  He also developed a new ulceration to his second toe.  Around the same time his toes and forefoot were very red.  He says that his erythema has improved.  He does not think that his wound on his right second toe has gotten any better.  He denies any drainage from this area.  He does endorse clear drainage from his foot and right lower leg.  He denies any fevers.  Current Outpatient Medications  Medication Sig Dispense Refill   doxycycline  (VIBRAMYCIN ) 100 MG capsule Take 1 capsule (100 mg total) by mouth 2 (two) times daily. 14 capsule 0   acetaminophen  (TYLENOL ) 325 MG tablet Take 2 tablets (650 mg total) by mouth every 6 (six) hours as needed for moderate pain. (Patient taking differently: Take 1,000 mg by mouth every 6 (six) hours as needed for moderate pain (pain score 4-6).) 30 tablet 0   apixaban  (ELIQUIS ) 2.5 MG TABS tablet Take 1 tablet (2.5 mg total) by mouth 2 (two) times daily. 180 tablet 3   augmented betamethasone dipropionate (DIPROLENE-AF) 0.05 % cream Apply topically.     calcium -vitamin D  (OSCAL WITH D) 500-5 MG-MCG tablet Take 2 tablets by mouth 2 (two) times daily. 60 tablet 2   carboxymethylcellul-glycerin  (OPTIVE) 0.5-0.9 % ophthalmic solution Place 1  drop into both eyes daily as needed for dry eyes.     cetirizine (ZYRTEC) 10 MG tablet Take 10 mg by mouth every morning.     cholecalciferol  (VITAMIN D3) 25 MCG (1000 UNIT) tablet Take 2,000 Units by mouth daily.     dapagliflozin  propanediol (FARXIGA ) 10 MG TABS tablet Take 1 tablet (10 mg total) by mouth daily. 90 tablet 3   feeding supplement (BOOST / RESOURCE BREEZE) LIQD Take 1 Container by mouth 2 (two) times daily between meals. (Patient taking differently: Take 237 mLs by mouth 2 (two) times daily between meals. Ensue)     furosemide  (LASIX ) 40 MG tablet TAKE 1 TABLET BY MOUTH EVERY DAY 30 tablet 3   magnesium  oxide (MAG-OX) 400 (240 Mg) MG tablet Take 1 tablet (400 mg total) by mouth daily. 30 tablet 3   metoprolol  succinate (TOPROL  XL) 25 MG 24 hr tablet Take 1 tablet (25 mg total) by mouth daily. 90 tablet 3   Omega-3 Fatty Acids  (FISH OIL) 1200 MG CAPS Take 1,200 mg by mouth every morning.     Pediatric Multivitamins-Iron (FLINTSTONES PLUS IRON PO) Take 1 tablet by mouth daily.     potassium chloride  SA (KLOR-CON  M) 20 MEQ tablet Take 2 tablets (40 mEq total) by mouth daily. 180 tablet 1   pravastatin  (PRAVACHOL ) 20 MG tablet Take 1 tablet (20 mg total)  by mouth every evening. 90 tablet 3   Probiotic Product (PHILLIPS COLON HEALTH PO) Take 1 capsule by mouth daily.      silver  sulfADIAZINE  (SILVADENE ) 1 % cream Apply 1 Application topically daily. 50 g 2   sodium bicarbonate  650 MG tablet Take 1,300 mg by mouth 2 (two) times daily.     No current facility-administered medications for this visit.    REVIEW OF SYSTEMS (negative unless checked):   Cardiac:  []  Chest pain or chest pressure? []  Shortness of breath upon activity? []  Shortness of breath when lying flat? []  Irregular heart rhythm?  Vascular:  []  Pain in calf, thigh, or hip brought on by walking? []  Pain in feet at night that wakes you up from your sleep? []  Blood clot in your veins? [x]  Leg swelling?  Pulmonary:   []  Oxygen at home? []  Productive cough? []  Wheezing?  Neurologic:  []  Sudden weakness in arms or legs? []  Sudden numbness in arms or legs? []  Sudden onset of difficult speaking or slurred speech? []  Temporary loss of vision in one eye? []  Problems with dizziness?  Gastrointestinal:  []  Blood in stool? []  Vomited blood?  Genitourinary:  []  Burning when urinating? []  Blood in urine?  Psychiatric:  []  Major depression  Hematologic:  []  Bleeding problems? []  Problems with blood clotting?  Dermatologic:  []  Rashes or ulcers?  Constitutional:  []  Fever or chills?  Ear/Nose/Throat:  []  Change in hearing? []  Nose bleeds? []  Sore throat?  Musculoskeletal:  []  Back pain? []  Joint pain? []  Muscle pain?   Physical Examination   Vitals:   07/28/24 1026  BP: (!) 159/71  Pulse: 74  Temp: 97.7 F (36.5 C)  TempSrc: Temporal   There is no height or weight on file to calculate BMI.  General:  chronically ill appearing male in NAD Gait: Not observed HENT: WNL, normocephalic Pulmonary: normal non-labored breathing Cardiac: regular Abdomen: soft, NT, no masses Skin: without rashes Vascular Exam/Pulses: Palpable femoral pulses. Nonpalpable right popliteal or pedal pulses. Monophasic right DP/PT doppler signals Extremities: well healed left AKA. Right lower leg and foot with dry, flaky skin. Healing venous ulceration to the right calf. New ulceration to the right 2nd toe with macerated, erythematous skin on the forefoot  Musculoskeletal: no muscle wasting or atrophy  Neurologic: A&O X 3;  No focal weakness or paresthesias are detected Psychiatric:  The pt has Normal affect.         Non-Invasive Vascular imaging   ABI (07/28/2024) R:  ABI: [place] ([place]),  PT: mono DP: mono TBI:  0.32 L AKA   Medical Decision Making   [name] is a 88 y.o. male who presents as a triage visit  The patient presents to our clinic today as a triage visit for new tissue  loss in the right lower extremity.  Most recently we have been following the patient for venous ulcerations to the right lower leg. The patient states about 2 weeks ago he developed some blistering to the top of his right foot, which subsequently opened up and has had clear drainage.  Around the same time he developed erythema of his forefoot and a new second toe ulceration.  He feels like his erythema has improved, however his second toe wound appears the same. He has been receiving weekly Unna boot changes by [facility].  His previous venous ulcerations have significantly improved. On exam he has palpable femoral pulses.  He has a nonpalpable right popliteal pulse.  He has nonpalpable right pedal pulses.  He does have monophasic right DP/PT Doppler signals.  He has macerated skin on his right forefoot and toes with an arterial ulceration to his right second toe.  His right foot is erythematous with concern for cellulitis. ABIs performed today are noncompressible.  His right great toe pressure is which is not adequate for wound healing. I have explained to the patient that he would benefit from abdominal aortogram with right lower extremity runoff and possible intervention.  Hopefully we can improve his blood flow in the right leg so he can heal his right second toe wound.  He does have palpable femoral pulses, which would suggest that his previous iliac stents are patent.  Intervention would likely be on the SFA, popliteal, and/or tibial vessels.  His Eliquis  will have to be held for his angiogram.  We will plan for right lower extremity angiogram the left common femoral access within the next 1 to 2 weeks with Dr. Serene.  I will also place the patient on a 1 week course of doxycycline  for possible cellulitis   Ahmed Holster PA-C [facility] Office: [phone]  Clinic MD: Serene

## 2024-08-08 ENCOUNTER — Other Ambulatory Visit: Payer: Self-pay

## 2024-08-08 DIAGNOSIS — N1832 Chronic kidney disease, stage 3b: Secondary | ICD-10-CM

## 2024-08-12 ENCOUNTER — Encounter

## 2024-08-19 ENCOUNTER — Ambulatory Visit (HOSPITAL_COMMUNITY)
Admission: RE | Admit: 2024-08-19 | Discharge: 2024-08-19 | Disposition: A | Discharge status: Home / Self Care | Attending: Surgery | Admitting: Surgery

## 2024-08-19 ENCOUNTER — Encounter (HOSPITAL_COMMUNITY)
Admission: RE | Disposition: A | Discharge status: Home / Self Care | Payer: Self-pay | Source: Home / Self Care | Attending: Surgery

## 2024-08-19 ENCOUNTER — Encounter (HOSPITAL_COMMUNITY): Payer: Self-pay | Admitting: Surgery

## 2024-08-19 ENCOUNTER — Other Ambulatory Visit: Payer: Self-pay

## 2024-08-19 ENCOUNTER — Ambulatory Visit

## 2024-08-19 DIAGNOSIS — L97519 Non-pressure chronic ulcer of other part of right foot with unspecified severity: Secondary | ICD-10-CM | POA: Insufficient documentation

## 2024-08-19 DIAGNOSIS — Z7901 Long term (current) use of anticoagulants: Secondary | ICD-10-CM | POA: Insufficient documentation

## 2024-08-19 DIAGNOSIS — S91301A Unspecified open wound, right foot, initial encounter: Secondary | ICD-10-CM

## 2024-08-19 DIAGNOSIS — I70201 Unspecified atherosclerosis of native arteries of extremities, right leg: Secondary | ICD-10-CM | POA: Diagnosis not present

## 2024-08-19 DIAGNOSIS — I70235 Atherosclerosis of native arteries of right leg with ulceration of other part of foot: Secondary | ICD-10-CM | POA: Insufficient documentation

## 2024-08-19 DIAGNOSIS — Z89612 Acquired absence of left leg above knee: Secondary | ICD-10-CM | POA: Insufficient documentation

## 2024-08-19 DIAGNOSIS — Z95820 Peripheral vascular angioplasty status with implants and grafts: Secondary | ICD-10-CM | POA: Insufficient documentation

## 2024-08-19 HISTORY — PX: LOWER EXTREMITY ANGIOGRAPHY: CATH118251

## 2024-08-19 HISTORY — PX: ABDOMINAL AORTOGRAM: CATH118222

## 2024-08-19 LAB — POCT I-STAT, CHEM 8
BUN: 43 mg/dL — ABNORMAL HIGH (ref 8–23)
Calcium, Ion: 1.16 mmol/L (ref 1.15–1.40)
Chloride: 105 mmol/L (ref 98–111)
Creatinine, Ser: 2.1 mg/dL — ABNORMAL HIGH (ref 0.61–1.24)
Glucose, Bld: 133 mg/dL — ABNORMAL HIGH (ref 70–99)
HCT: 38 % — ABNORMAL LOW (ref 39.0–52.0)
Hemoglobin: 12.9 g/dL — ABNORMAL LOW (ref 13.0–17.0)
Potassium: 4 mmol/L (ref 3.5–5.1)
Sodium: 142 mmol/L (ref 135–145)
TCO2: 26 mmol/L (ref 22–32)

## 2024-08-19 LAB — GLUCOSE, CAPILLARY: Glucose-Capillary: 128 mg/dL — ABNORMAL HIGH (ref 70–99)

## 2024-08-19 SURGERY — ABDOMINAL AORTOGRAM
Anesthesia: LOCAL | Laterality: Right

## 2024-08-19 MED ORDER — MIDAZOLAM HCL (PF) 2 MG/2ML IJ SOLN
INTRAMUSCULAR | Status: DC | PRN
  Administered 2024-08-19: 1 mg via INTRAVENOUS

## 2024-08-19 MED ORDER — HEPARIN (PORCINE) IN NACL 1000-0.9 UT/500ML-% IV SOLN
INTRAVENOUS | Status: DC | PRN
  Administered 2024-08-19 (×2): 500 mL

## 2024-08-19 MED ORDER — SODIUM CHLORIDE 0.9 % WEIGHT BASED INFUSION: 1.0000 mL/kg/h | INTRAVENOUS | Status: DC

## 2024-08-19 MED ORDER — LIDOCAINE HCL (PF) 1 % IJ SOLN
INTRAMUSCULAR | Status: DC | PRN
  Administered 2024-08-19: 15 mL

## 2024-08-19 MED ORDER — FENTANYL CITRATE (PF) 100 MCG/2ML IJ SOLN
INTRAMUSCULAR | Status: AC
  Filled 2024-08-19: qty 2

## 2024-08-19 MED ORDER — IODIXANOL 320 MG/ML IV SOLN
INTRAVENOUS | Status: DC | PRN
  Administered 2024-08-19: 15 mL via INTRA_ARTERIAL

## 2024-08-19 MED ORDER — ACETAMINOPHEN 325 MG PO TABS: 650.0000 mg | ORAL_TABLET | ORAL | Status: DC | PRN

## 2024-08-19 MED ORDER — SODIUM CHLORIDE 0.9 % IV SOLN: INTRAVENOUS | Status: DC

## 2024-08-19 MED ORDER — FENTANYL CITRATE (PF) 100 MCG/2ML IJ SOLN
INTRAMUSCULAR | Status: DC | PRN
  Administered 2024-08-19: 50 ug via INTRAVENOUS

## 2024-08-19 MED ORDER — SODIUM CHLORIDE 0.9 % IV SOLN: 250.0000 mL | INTRAVENOUS | Status: DC | PRN

## 2024-08-19 MED ORDER — HYDRALAZINE HCL 20 MG/ML IJ SOLN: 5.0000 mg | INTRAMUSCULAR | Status: DC | PRN

## 2024-08-19 MED ORDER — SODIUM CHLORIDE 0.9% FLUSH: 3.0000 mL | Freq: Two times a day (BID) | INTRAVENOUS | Status: DC

## 2024-08-19 MED ORDER — LABETALOL HCL 5 MG/ML IV SOLN: 10.0000 mg | INTRAVENOUS | Status: DC | PRN

## 2024-08-19 MED ORDER — MIDAZOLAM HCL 2 MG/2ML IJ SOLN
INTRAMUSCULAR | Status: AC
  Filled 2024-08-19: qty 2

## 2024-08-19 MED ORDER — SODIUM CHLORIDE 0.9% FLUSH: 3.0000 mL | INTRAVENOUS | Status: DC | PRN

## 2024-08-19 SURGICAL SUPPLY — 10 items
CATH OMNI FLUSH 5F 65CM (CATHETERS) IMPLANT
DEVICE VASC CLSR CELT ART 5 (Vascular Products) IMPLANT
GUIDEWIRE ANGLED .035 180CM (WIRE) IMPLANT
KIT ANGIASSIST CO2 SYSTEM (KITS) IMPLANT
KIT MICROPUNCTURE NIT STIFF (SHEATH) IMPLANT
PACK CARDIAC CATHETERIZATION (CUSTOM PROCEDURE TRAY) IMPLANT
SET ATX-X65L (MISCELLANEOUS) IMPLANT
SHEATH PINNACLE 5F 10CM (SHEATH) IMPLANT
SHEATH PROBE COVER 6X72 (BAG) IMPLANT
WIRE BENTSON .035X145CM (WIRE) IMPLANT

## 2024-08-19 NOTE — Op Note (Signed)
    Patient name: [name] MRN: [id number] DOB: 1936-08-13 Sex: male  08/19/2024 Pre-operative Diagnosis: Right foot wound Post-operative diagnosis:  Same Surgeon:  Malvina New Procedure Performed:  1.  Ultrasound-guided access, left femoral artery  2.  Aortobifemoral angiogram with CO2  3.  Right leg angiogram  4.  Selective injection with cath in right external iliac artery  5.  Conscious sedation, 20 minutes  6.  Closure device, Celt    Indications: This is an 88 year old gentleman with right foot wound who comes in today for angiographic evaluation  Procedure:  The patient was identified in the holding area and taken to room 8.  The patient was then placed supine on the table and prepped and draped in the usual sterile fashion.  A time out was called.  Conscious sedation was administered with the use of IV fentanyl  and Versed  under continuous physician and nurse monitoring.  Heart rate, blood pressure, and oxygen saturation were continuously monitored.  Total sedation time was 20 minutes.  Ultrasound was used to evaluate the left common femoral artery.  It was patent .  A digital ultrasound image was acquired.  A micropuncture needle was used to access the left common femoral artery under ultrasound guidance.  An 018 wire was advanced without resistance and a micropuncture sheath was placed.  The 018 wire was removed and a benson wire was placed.  The micropuncture sheath was exchanged for a 5 french sheath.  An omniflush catheter was advanced over the wire to the level of L-1.  An abdominal angiogram was obtained.  Next, using the omniflush catheter and a benson wire, the aortic bifurcation was crossed and the catheter was placed into theright external iliac artery and right runoff was obtained.  A Celt was used for closure Findings:   Aortogram: No significant renal artery stenosis.  The infrarenal abdominal aorta is widely patent.  Bilateral iliac stents are widely patent.  No  stenosis within the extrailiac arteries or common femoral arteries bilaterally  Right Lower Extremity: The right common femoral, profundofemoral, and superficial femoral artery are patent without hemodynamically significant stenosis.  The popliteal arteries widely patent.  There is two-vessel runoff via the posterior tibial and peroneal artery     Impression:  #1  No significant aortoiliac occlusive disease.  Bilateral common iliac stents are widely patent  #2  Right common femoral profundofemoral and superficial femoral artery are widely patent.  The popliteal artery is widely patent.  There is two-vessel runoff via the posterior tibial and peroneal artery  #3  Microvascular disease out of the foot   V. Malvina New, M.D., [facility] [place] Office: [phone] Pager:  [phone]

## 2024-08-19 NOTE — Discharge Instructions (Signed)
 Start eliquis  tomorrow am

## 2024-08-19 NOTE — Interval H&P Note (Signed)
 History and Physical Interval Note:  08/19/2024 10:14 AM  [name]  has presented today for surgery, with the diagnosis of ischemia right lower with ulcer.  The various methods of treatment have been discussed with the patient and family. After consideration of risks, benefits and other options for treatment, the patient has consented to  Procedure(s): ABDOMINAL AORTOGRAM (N/A) Lower Extremity Angiography (Right) as a surgical intervention.  The patient's history has been reviewed, patient examined, no change in status, stable for surgery.  I have reviewed the patient's chart and labs.  Questions were answered to the patient's satisfaction.     Malvina New

## 2024-09-04 ENCOUNTER — Telehealth: Payer: Self-pay

## 2024-09-04 ENCOUNTER — Ambulatory Visit

## 2024-09-04 VITALS — Ht 69.0 in | Wt 170.0 lb

## 2024-09-04 DIAGNOSIS — Z0000 Encounter for general adult medical examination without abnormal findings: Secondary | ICD-10-CM

## 2024-09-04 NOTE — Patient Instructions (Signed)
 Mr. Maceachern,  Thank you for taking the time for your Medicare Wellness Visit. I appreciate your continued commitment to your health goals. Please review the care plan we discussed, and feel free to reach out if I can assist you further.  Please note that Annual Wellness Visits do not include a physical exam. Some assessments may be limited, especially if the visit was conducted virtually. If needed, we may recommend an in-person follow-up with your provider.  Ongoing Care Seeing your primary care provider every 3 to 6 months helps us  monitor your health and provide consistent, personalized care.   Referrals If a referral was made during today's visit and you haven't received any updates within two weeks, please contact the referred provider directly to check on the status.  Next Medicare Wellness Visit: September 07, 2025 at 3:10pm in office    Recommended Screenings:  Health Maintenance  Topic Date Due   COVID-19 Vaccine (1) Never done   Complete foot exam   Never done   Eye exam for diabetics  Never done   Yearly kidney health urinalysis for diabetes  Never done   DTaP/Tdap/Td vaccine (1 - Tdap) Never done   Pneumococcal Vaccine for age over 65 (1 of 2 - PCV) Never done   Zoster (Shingles) Vaccine (1 of 2) Never done   Flu Shot  05/23/2024   Hemoglobin A1C  09/14/2024   Yearly kidney function blood test for diabetes  03/14/2025   Medicare Annual Wellness Visit  09/04/2025   Meningitis B Vaccine  Aged Out       09/04/2024   10:40 AM  Advanced Directives  Does Patient Have a Medical Advance Directive? No  Would patient like information on creating a medical advance directive? No - Patient declined    Vision: Annual vision screenings are recommended for early detection of glaucoma, cataracts, and diabetic retinopathy. These exams can also reveal signs of chronic conditions such as diabetes and high blood pressure.  Dental: Annual dental screenings help detect early signs of  oral cancer, gum disease, and other conditions linked to overall health, including heart disease and diabetes.  Please see the attached documents for additional preventive care recommendations.

## 2024-09-04 NOTE — Telephone Encounter (Signed)
 [name], RN [facility] called reporting new ulcer on pt's right 2nd toe between his 2nd toe and the great toe.  [name] stated that the new ulcer could be the result of previous nurse placing guaze between the toes.    Verbal order given to apply Ca Alginate to the new ulcer and wrap foot and leg in [employer].

## 2024-09-04 NOTE — Progress Notes (Signed)
 Chief Complaint  Patient presents with   Medicare Wellness     Subjective:   [name] is a 88 y.o. male who presents for a Medicare Annual Wellness Visit.  Allergies (verified) Lipitor [atorvastatin ], Simvastatin , Cefuroxime, Penicillins, and Sulfa antibiotics   History: Past Medical History:  Diagnosis Date   Arteriosclerotic cardiovascular disease (ASCVD)    CABG in 1990s; 2002 total obstruction of LAD, CX and RCA with patent grafts and nl EF; Stress nuc. 2008 - mild LV dilation; normal EF; questionable small anteroapical scar; no ischemia   Arthritis    left knee (11/08/2015)   Atrial fibrillation (HCC)    Cellulitis 11/08/2015   both feet   Chronic anticoagulation    Dental crowns present    Diabetic peripheral neuropathy (HCC)    bilateral lower leg, hands   History of blood transfusion    related to my leg surgery?   Hypertension    states under control with meds., has been on med. x long time   Iron deficiency anemia    Jaw cancer (HCC) 1990s   squamous cell   Myocardial infarction (HCC) 1990s   after my jaw OR   Pedal edema    not a lot, per pt.   Peripheral vascular disease    Presence of retained hardware 07/2015   failed hardware jaw   Runny nose 07/27/2015   clear drainage, per pt.   Stroke (HCC)    Type II diabetes mellitus (HCC)    diet controlled (11/08/2015)   Past Surgical History:  Procedure Laterality Date   ABDOMINAL AORTOGRAM N/A 08/19/2024   Procedure: ABDOMINAL AORTOGRAM;  Surgeon: Serene Gaile ORN, MD;  Location: [facility];  Service: Cardiovascular;  Laterality: N/A;   ABDOMINAL AORTOGRAM W/LOWER EXTREMITY Bilateral 08/20/2020   Procedure: ABDOMINAL AORTOGRAM W/LOWER EXTREMITY;  Surgeon: Eliza Lonni RAMAN, MD;  Location: [facility] INVASIVE CV [facility];  Service: Cardiovascular;  Laterality: Bilateral;   ABDOMINAL AORTOGRAM W/LOWER EXTREMITY Right 10/31/2022   Procedure: ABDOMINAL AORTOGRAM W/LOWER EXTREMITY;  Surgeon:  Serene Gaile ORN, MD;  Location: [facility];  Service: Cardiovascular;  Laterality: Right;   AMPUTATION Left 11/11/2015   Procedure: LEFT ABOVE KNEE AMPUTATION;  Surgeon: Lonni RAMAN Eliza, MD;  Location: [facility] OR;  Service: Vascular;  Laterality: Left;   CARDIAC CATHETERIZATION  1990s; 04/16/2001   CATARACT EXTRACTION W/ INTRAOCULAR LENS  IMPLANT, BILATERAL Bilateral 09/2015   COLONOSCOPY N/A 11/27/2014   Procedure: COLONOSCOPY;  Surgeon: Claudis RAYMOND Rivet, MD;  Location: [facility];  Service: Endoscopy;  Laterality: N/A;  830   CORONARY ARTERY BYPASS GRAFT  1990's   CABG X4   ESOPHAGOGASTRODUODENOSCOPY N/A 02/14/2018   Procedure: ESOPHAGOGASTRODUODENOSCOPY (EGD);  Surgeon: Rivet Claudis RAYMOND, MD;  Location: [facility];  Service: Endoscopy;  Laterality: N/A;   ESOPHAGOGASTRODUODENOSCOPY (EGD) WITH PROPOFOL  N/A 02/25/2018   Procedure: ESOPHAGOGASTRODUODENOSCOPY (EGD) WITH PROPOFOL ;  Surgeon: Rivet Claudis RAYMOND, MD;  [facility]: [facility];  Service: Endoscopy;  Laterality: N/A;   FRACTURE SURGERY     GIVENS CAPSULE STUDY N/A 02/15/2018   Procedure: GIVENS CAPSULE STUDY;  Surgeon: Rivet Claudis RAYMOND, MD;  Location: [facility];  Service: Endoscopy;  Laterality: N/A;   GIVENS CAPSULE STUDY N/A 03/27/2018   Procedure: GIVENS CAPSULE STUDY;  Surgeon: Rivet Claudis RAYMOND, MD;  Location: [facility];  Service: Endoscopy;  Laterality: N/A;   I & D EXTREMITY Left 11/10/2015   Procedure: INCISION AND DRAINAGE LEFT FOOT, AMPUTATION OF LEFT THIRD TOE;  Surgeon: Redell LITTIE Door, MD;  Location: [facility] OR;  Service: Vascular;  Laterality: Left;   INCISION AND DRAINAGE ABSCESS Left 06/16/2005   wide exc. abscess 5th toe   IR ANGIOGRAM VISCERAL SELECTIVE  03/28/2018   IR ANGIOGRAM VISCERAL SELECTIVE  03/28/2018   IR CT HEAD LTD  01/04/2019   IR PERCUTANEOUS ART THROMBECTOMY/INFUSION INTRACRANIAL INC DIAG ANGIO  01/04/2019   IR US  GUIDE VASC ACCESS LEFT  03/28/2018   LOWER EXTREMITY ANGIOGRAPHY Right 08/19/2024    Procedure: Lower Extremity Angiography;  Surgeon: Serene Gaile ORN, MD;  Location: [facility];  Service: Cardiovascular;  Laterality: Right;   MANDIBULAR HARDWARE REMOVAL Left 08/02/2015   Procedure:  HARDWARE REMOVAL TWO MANDIBULAR SCREWS;  Surgeon: Ezzie Pinal, DDS;  Location: [facility];  Service: Oral Surgery;  Laterality: Left;   ORIF TIBIA FRACTURE Left ~ 1970   PERIPHERAL VASCULAR CATHETERIZATION N/A 08/14/2016   Procedure: Abdominal Aortogram w/Lower Extremity;  Surgeon: Lonni GORMAN Blade, MD;  Location: [facility] INVASIVE CV LAB;  Service: Cardiovascular;  Laterality: N/A;   PERIPHERAL VASCULAR INTERVENTION Bilateral 08/20/2020   Procedure: PERIPHERAL VASCULAR INTERVENTION;  Surgeon: Blade Lonni GORMAN, MD;  Location: [facility] INVASIVE CV LAB;  Service: Cardiovascular;  Laterality: Bilateral;  iliac stents   RADIOLOGY WITH ANESTHESIA N/A 01/04/2019   Procedure: CODE STROKE;  Surgeon: Radiologist, Medication, MD;  Location: [facility] OR;  Service: Radiology;  Laterality: N/A;   SQUAMOUS CELL CARCINOMA EXCISION Left 1993   took my jaw out; got cadavar in there now   TONSILLECTOMY     Family History  Problem Relation Age of Onset   Heart disease Mother    Social History   Occupational History   Not on file  Tobacco Use   Smoking status: Never    Passive exposure: Never   Smokeless tobacco: Never  Vaping Use   Vaping status: Never Used  Substance and Sexual Activity   Alcohol  use: No    Alcohol /week: 0.0 standard drinks of alcohol    Drug use: No   Sexual activity: Yes   Tobacco Counseling Counseling given: Yes  SDOH Screenings   Food Insecurity: No Food Insecurity (09/04/2024)  Housing: Low Risk  (09/04/2024)  Transportation Needs: No Transportation Needs (09/04/2024)  Utilities: Not At Risk (09/04/2024)  Depression (PHQ2-9): Low Risk  (09/04/2024)  Physical Activity: Insufficiently Active (09/04/2024)  Social Connections: Moderately Integrated  (09/04/2024)  Stress: No Stress Concern Present (09/04/2024)  Tobacco Use: Low Risk  (09/04/2024)  Health Literacy: Adequate Health Literacy (09/04/2024)   See flowsheets for full screening details  Depression Screen PHQ 2 & 9 Depression Scale- Over the past 2 weeks, how often have you been bothered by any of the following problems? Little interest or pleasure in doing things: 0 Feeling down, depressed, or hopeless (PHQ Adolescent also includes...irritable): 0 PHQ-2 Total Score: 0 Trouble falling or staying asleep, or sleeping too much: 0 Feeling tired or having little energy: 0 Poor appetite or overeating (PHQ Adolescent also includes...weight loss): 0 Feeling bad about yourself - or that you are a failure or have let yourself or your family down: 0 Trouble concentrating on things, such as reading the newspaper or watching television (PHQ Adolescent also includes...like school work): 0 Moving or speaking so slowly that other people could have noticed. Or the opposite - being so fidgety or restless that you have been moving around a lot more than usual: 0 Thoughts that you would be better off dead, or of hurting yourself in some way:  0 PHQ-9 Total Score: 0 If you checked off any problems, how difficult have these problems made it for you to do your work, take care of things at home, or get along with other people?: Not difficult at all     Goals Addressed               This Visit's Progress     Remain active and healthy (pt-stated)         Visit info / Clinical Intake: Medicare Wellness Visit Type:: Subsequent Annual Wellness Visit Persons participating in visit:: patient Medicare Wellness Visit Mode:: Telephone If telephone:: video declined Because this visit was a virtual/telehealth visit:: pt reported vitals If Telephone or Video please confirm:: The patient expressed understanding and agreed to proceed; I connected with the patient using audio enabled telemedicine  application and verified that I am speaking with the correct person using two identifiers; I discussed the limitations of evaluation and management by telemedicine Patient Location:: home Provider Location:: office Information given by:: patient Interpreter Needed?: No Pre-visit prep was completed: yes AWV questionnaire completed by patient prior to visit?: no Living arrangements:: lives with spouse/significant other Patient's Overall Health Status Rating: good Typical amount of pain: none Does pain affect daily life?: no Are you currently prescribed opioids?: no  Dietary Habits and Nutritional Risks How many meals a day?: 3 Eats fruit and vegetables daily?: yes Most meals are obtained by: having others provide food In the last 2 weeks, have you had any of the following?: none Diabetic:: no  Functional Status Activities of Daily Living (to include ambulation/medication): Independent Feeding: Independent Dressing/Grooming: Independent Bathing: Needs assistance Toileting: Needs assistance Transfer: Independent with device- listed below Ambulation: Independent with device- listed below Home Assistive Devices/Equipment: Wheelchair armed forces technical officer) Medication Administration: Independent Home Management: Dependent (history of left leg amputation. doesn't wear prosthesis, states it hurts and is uncomfortable. wife takes care of the home) Manage your own finances?: yes Primary transportation is: driving Concerns about vision?: no *vision screening is required for WTM* Concerns about hearing?: no  Fall Screening Falls in the past year?: 1 Number of falls in past year: 0 Was there an injury with Fall?: 1 Fall Risk Category Calculator: 2 Patient Fall Risk Level: Moderate Fall Risk  Fall Risk Patient at Risk for Falls Due to: History of fall(s); Orthopedic patient Fall risk Follow up: Falls evaluation completed; Education provided; Falls prevention discussed  Home and  Transportation Safety: All rugs have non-skid backing?: yes All stairs or steps have railings?: yes Grab bars in the bathtub or shower?: yes Have non-skid surface in bathtub or shower?: yes Good home lighting?: yes Regular seat belt use?: yes Hospital stays in the last year:: no  Cognitive Assessment Difficulty concentrating, remembering, or making decisions? : no Will 6CIT or Mini Cog be Completed: no 6CIT or Mini Cog Declined: patient alert, oriented, able to answer questions appropriately and recall recent events  Advance Directives (For Healthcare) Does Patient Have a Medical Advance Directive?: No Type of Advance Directive: Living will; Healthcare Power of Attorney Would patient like information on creating a medical advance directive?: No - Patient declined  Reviewed/Updated  Reviewed/Updated: Reviewed All (Medical, Surgical, Family, Medications, Allergies, Care Teams, Patient Goals)        Objective:    Today's Vitals   09/04/24 1038  Weight: 170 lb (77.1 kg)  Height: 5' 9 (1.753 m)   Body mass index is 25.1 kg/m.  Current Medications (verified) Outpatient Encounter Medications as of 09/04/2024  Medication Sig   acetaminophen  (TYLENOL ) 325 MG tablet Take 2 tablets (650 mg total) by mouth every 6 (six) hours as needed for moderate pain. (Patient taking differently: Take 1,000 mg by mouth every 6 (six) hours as needed for moderate pain (pain score 4-6).)   apixaban  (ELIQUIS ) 2.5 MG TABS tablet Take 1 tablet (2.5 mg total) by mouth 2 (two) times daily.   augmented betamethasone dipropionate (DIPROLENE-AF) 0.05 % cream Apply 1 application  topically daily as needed (Wound).   calcium -vitamin D  (OSCAL WITH D) 500-5 MG-MCG tablet Take 2 tablets by mouth 2 (two) times daily.   carboxymethylcellul-glycerin  (OPTIVE) 0.5-0.9 % ophthalmic solution Place 1 drop into both eyes daily as needed for dry eyes.   cetirizine (ZYRTEC) 10 MG tablet Take 10 mg by mouth every morning.    cholecalciferol  (VITAMIN D3) 25 MCG (1000 UNIT) tablet Take 1,000 Units by mouth daily.   FARXIGA  10 MG TABS tablet TAKE 1 TABLET BY MOUTH EVERY DAY   feeding supplement (BOOST / RESOURCE BREEZE) LIQD Take 1 Container by mouth 2 (two) times daily between meals. (Patient taking differently: Take 237 mLs by mouth daily. Ensue)   furosemide  (LASIX ) 40 MG tablet TAKE 1 TABLET BY MOUTH EVERY DAY   magnesium  oxide (MAG-OX) 400 (240 Mg) MG tablet Take 1 tablet (400 mg total) by mouth daily.   metoprolol  succinate (TOPROL  XL) 25 MG 24 hr tablet Take 1 tablet (25 mg total) by mouth daily.   Omega-3 Fatty Acids  (FISH OIL) 1200 MG CAPS Take 1,200 mg by mouth every morning.   Pediatric Multivitamins-Iron (FLINTSTONES PLUS IRON PO) Take 1 tablet by mouth daily.   potassium chloride  SA (KLOR-CON  M) 20 MEQ tablet Take 2 tablets (40 mEq total) by mouth daily.   pravastatin  (PRAVACHOL ) 20 MG tablet Take 1 tablet (20 mg total) by mouth every evening.   Probiotic Product (PHILLIPS COLON HEALTH PO) Take 1 capsule by mouth daily.    silver  sulfADIAZINE  (SILVADENE ) 1 % cream Apply 1 Application topically daily. (Patient taking differently: Apply 1 Application topically every Thursday. Leg wound by nurse)   sodium bicarbonate  650 MG tablet Take 650 mg by mouth daily.   No facility-administered encounter medications on file as of 09/04/2024.   Hearing/Vision screen Hearing Screening - Comments:: Patient denies any hearing difficulties.   Vision Screening - Comments:: Wears rx glasses - up to date with routine eye exams with  Dr. Octavia Immunizations and Health Maintenance Health Maintenance  Topic Date Due   COVID-19 Vaccine (1) Never done   FOOT EXAM  Never done   OPHTHALMOLOGY EXAM  Never done   Diabetic kidney evaluation - Urine ACR  Never done   DTaP/Tdap/Td (1 - Tdap) Never done   Pneumococcal Vaccine: 50+ Years (1 of 2 - PCV) Never done   Zoster Vaccines- Shingrix (1 of 2) Never done   Influenza Vaccine   05/23/2024   HEMOGLOBIN A1C  09/14/2024   Diabetic kidney evaluation - eGFR measurement  03/14/2025   Medicare Annual Wellness (AWV)  09/04/2025   Meningococcal B Vaccine  Aged Out        Assessment/Plan:  This is a routine wellness examination for Alejos.  Patient Care Team: Bevely Doffing, FNP as PCP - General (Family Medicine) Jeffrie Oneil BROCKS, MD as Consulting Physician (Cardiology) Octavia Bruckner, MD as Consulting Physician (Ophthalmology)  I have personally reviewed and noted the following in the patient's chart:   Medical and social history Use of alcohol , tobacco or illicit drugs  Current medications and supplements including opioid prescriptions. Functional ability and status Nutritional status Physical activity Advanced directives List of other physicians Hospitalizations, surgeries, and ER visits in previous 12 months Vitals Screenings to include cognitive, depression, and falls Referrals and appointments  No orders of the defined types were placed in this encounter.  In addition, I have reviewed and discussed with patient certain preventive protocols, quality metrics, and best practice recommendations. A written personalized care plan for preventive services as well as general preventive health recommendations were provided to patient.   Latash Nouri, CMA   09/04/2024   Return on September 07, 2025 at 3:10 pm in office, for your yearly Medicare Wellness Visit.  After Visit Summary: (MyChart) Due to this being a telephonic visit, the after visit summary with patients personalized plan was offered to patient via MyChart   Nurse Notes: per patient he is not a diabetic

## 2024-09-11 ENCOUNTER — Telehealth: Payer: Self-pay

## 2024-09-11 ENCOUNTER — Encounter: Payer: Self-pay | Admitting: Physician Assistant

## 2024-09-11 NOTE — Telephone Encounter (Signed)
 [name], RN with [facility] called reporting the wound on pt's toe is healed. Patient's foot is moist on top of his foot and between his toes.  Verbal order given to apply CaAlg on the foot and  between the toes and wrap with dry gauze.

## 2024-09-12 ENCOUNTER — Encounter: Payer: Self-pay | Admitting: Student

## 2024-09-12 ENCOUNTER — Ambulatory Visit: Attending: Student | Admitting: Student

## 2024-09-12 VITALS — BP 128/64 | HR 75 | Ht 69.0 in | Wt 175.0 lb

## 2024-09-12 DIAGNOSIS — E785 Hyperlipidemia, unspecified: Secondary | ICD-10-CM | POA: Diagnosis not present

## 2024-09-12 DIAGNOSIS — I5022 Chronic systolic (congestive) heart failure: Secondary | ICD-10-CM | POA: Diagnosis not present

## 2024-09-12 DIAGNOSIS — I4821 Permanent atrial fibrillation: Secondary | ICD-10-CM

## 2024-09-12 DIAGNOSIS — I4729 Other ventricular tachycardia: Secondary | ICD-10-CM

## 2024-09-12 DIAGNOSIS — I25810 Atherosclerosis of coronary artery bypass graft(s) without angina pectoris: Secondary | ICD-10-CM

## 2024-09-12 DIAGNOSIS — N1832 Chronic kidney disease, stage 3b: Secondary | ICD-10-CM

## 2024-09-12 NOTE — Progress Notes (Signed)
 Cardiology Office Note    Date:  09/12/2024  ID:  [name], DOB 1936/07/11, MRN [id number] Cardiologist: Oneil Parchment, MD Cardiology APP:  Johnson Laymon HERO, PA-C { : History of Present Illness:    [name] is a 88 y.o. male  with past history of CAD (s/p CABG in 1990's, patent bypass grafts by catheterization in 2002, low-risk NST in 2008), chronic HFrEF (EF 20 to 25% by echocardiogram in 07/2022 and repeat cath not pursued given worsening renal function, EF 20 to 25% in 12/2022), permanent atrial fibrillation, HTN, HLD, Stage 3-4 CKD, history of GI bleed, PAD (s/p L AKA) and prior CVA who presents to the office today for 23-month follow-up.  He was last examined by myself in 10/2023 and reported things had been stable since his last office visit. He was mostly wheelchair-bound but denied any recent chest pain or progressive dyspnea. He did have wounds along his right lower extremity and was being followed by home health. In regards to his cardiomyopathy, options in regards to adding Hydralazine  or Nitrates were reviewed but he was not interested in additional medications at that time and had previously not been on an ACE-I/ARB/ARNI/MRA given his renal function  He was continued on his current cardiac medications with Eliquis  2.5 mg twice daily, Farxiga  10 mg daily, Lasix  40 mg daily, Toprol -XL 25 mg daily and Pravastatin  20 mg daily.  In the interim, he did undergo lower extremity angiography by Dr. Serene on 08/19/2024 due to his right foot wound. He had no significant aortoiliac occlusive disease and bilateral common iliac stents were widely patent.  In talking with the patient and his daughter today, he reports things have overall been stable since his last office visit. He is mostly wheelchair-bound at baseline and denies any recent chest pain or dyspnea. No specific palpitations, orthopnea, PND or pitting edema. Home health is coming out once a week to continue to wrap his right  lower leg. He says his wound has been gradually improving and is starting to heal. He consumes several glasses of Coca-Cola soda a day but also consumes several cups of water .  Studies Reviewed:   EKG: EKG is ordered today and demonstrates:   EKG Interpretation Date/Time:  Friday September 12 2024 13:16:12 EST Ventricular Rate:  75 PR Interval:    QRS Duration:  116 QT Interval:  416 QTC Calculation: 464 R Axis:   -31  Text Interpretation: Atrial fibrillation Left axis deviation Non-specific intra-ventricular conduction delay Confirmed by Johnson Laymon ([id number]) on 09/12/2024 1:24:27 PM       Limited Echo: 12/2022 IMPRESSIONS     1. The distal apex is dyskinetic,mildly aneurysmal.. Left ventricular  ejection fraction, by estimation, is 20 to 25%. The left ventricle has  severely decreased function. The left ventricle demonstrates global  hypokinesis.   2. Right ventricular systolic function is normal. The right ventricular  size is normal.   3. Limited echo assess LV function   Risk Assessment/Calculations:   CHA2DS2-VASc Score = 7 This indicates a 11.2% annual risk of stroke. The patient's score is based upon: CHF History: 1 HTN History: 1 Diabetes History: 0 Stroke History: 2 Vascular Disease History: 1 Age Score: 2 Gender Score: 0    Physical Exam:   VS:  BP 128/64   Pulse 75   Ht 5' 9 (1.753 m)   Wt 175 lb (79.4 kg)   SpO2 100%   BMI 25.84 kg/m    Wt Readings from Last 3  Encounters:  09/12/24 175 lb (79.4 kg)  09/04/24 170 lb (77.1 kg)  08/19/24 170 lb (77.1 kg)     GEN: Pleasant, elderly male appearing in no acute distress. Sitting in a wheelchair. NECK: No JVD; No carotid bruits CARDIAC: Irregular irregular, no murmurs, rubs, gallops RESPIRATORY:  Clear to auscultation without rales, wheezing or rhonchi  ABDOMEN: Appears non-distended. No obvious abdominal masses. EXTREMITIES: No clubbing or cyanosis. No pitting edema. Left AKA. Right leg  wrapped.   Assessment and Plan:   1. Coronary artery disease involving coronary bypass graft of native heart without angina pectoris - He previously underwent CABG in the 1990's and had patent grafts by cardiac catheterization 2002. Options of further ischemic evaluation were previously reviewed and he was not interested in further procedures and was also not previously felt to be a candidate for repeat cardiac catheterization given his high risk of contrast-induced nephropathy. - His activity is limited as he is mostly wheelchair-bound but he denies any chest pain or worsening dyspnea. He is not on ASA given the need for anticoagulation. Continue Toprol -XL 25 mg daily and Pravastatin  20 mg daily.  2. Hyperlipidemia LDL goal <70 - LDL was at 60 when checked in 02/2024. Continue current medical therapy with Pravastatin  20 mg daily as he previously had myalgias with higher intensity statins.  3. Chronic HFrEF (heart failure with reduced ejection fraction) (HCC) - Most recent echocardiogram in 12/2022 showed his EF remained reduced at 20 to 25%. He appears euvolemic by examination today and denies any recent respiratory issues. We reviewed the option of obtaining a follow-up echocardiogram given the timeframe since his last study but he is not interested in additional testing at this time since he is overall feeling well.  We reviewed warning signs to monitor for.  - Will continue Farxiga  10 mg daily (will need to hold if his wound starts to worsen), Lasix  40 mg daily and Toprol -XL 25 mg daily.  He has not been on an ACE-I/ARB/ARNI/MRA given his variable renal function.  4. Permanent atrial fibrillation (HCC) - He denies any recent palpitations and heart rate is well-controlled in the 70's during today's visit. Continue Toprol -XL 25 mg daily for rate-control. - No reports of active bleeding. Continue Eliquis  2.5 mg twice daily for anticoagulation which is the appropriate dose given his age at 34 and  creatinine greater than 1.5. CBC in 02/2024 showed his hemoglobin was stable at 12.3 with platelets at 285 K.   5. NSVT (nonsustained ventricular tachycardia) (HCC) - Previously evaluated EP and not felt to be a candidate for ICD placement given his advanced age and comorbidities. He denies any recent palpitations.   - Continue medical therapy with Toprol -XL 25 mg daily.  6. Stage 3 CKD - Creatinine was at 2.10 when checked on 08/19/2024 which is close to his known baseline. Remains on Farxiga  10 mg daily.  Signed, Laymon CHRISTELLA Qua, PA-C

## 2024-09-12 NOTE — Patient Instructions (Signed)

## 2024-09-17 ENCOUNTER — Encounter: Payer: Self-pay | Admitting: Physician Assistant

## 2024-09-23 ENCOUNTER — Ambulatory Visit

## 2024-09-23 VITALS — BP 136/70 | HR 80 | Ht 69.0 in | Wt 175.0 lb

## 2024-09-23 DIAGNOSIS — I739 Peripheral vascular disease, unspecified: Secondary | ICD-10-CM

## 2024-09-23 DIAGNOSIS — E785 Hyperlipidemia, unspecified: Secondary | ICD-10-CM

## 2024-09-23 DIAGNOSIS — N1832 Chronic kidney disease, stage 3b: Secondary | ICD-10-CM

## 2024-09-23 DIAGNOSIS — E1122 Type 2 diabetes mellitus with diabetic chronic kidney disease: Secondary | ICD-10-CM

## 2024-09-23 NOTE — Assessment & Plan Note (Signed)
 Leg ulcer healing well with one persistent area. No infection.  - Continue home health services for leg ulcer care.

## 2024-09-23 NOTE — Assessment & Plan Note (Signed)
 He remains on Farxiga .  Repeat labs ordered today.

## 2024-09-23 NOTE — Assessment & Plan Note (Signed)
-   Recheck A1c today. No refills needed at this time

## 2024-09-23 NOTE — Progress Notes (Signed)
 Established Patient Office Visit  Subjective   Patient ID: [name], male    DOB: [birth year]  Age: 88 y.o. MRN: [id number]  Chief Complaint  Patient presents with   Medical Management of Chronic Issues    3 month follow up     HPI Discussed the use of AI scribe software for clinical note transcription with the patient, who gave verbal consent to proceed.  History of Present Illness    [name] is an 88 year old male who presents with a non-healing sore on his leg. He is accompanied by his daughter, who is also his primary caregiver.  Non-healing leg ulcer - Non-healing sore present on leg for a prolonged duration - Area described as a 'trouble spot' with recurrent episodes - History of biting the area once, resulting in significant bleeding - No history of cold sores  Nutritional status - No lunch consumed today - Small breakfast eaten  Home health care - Home health currently assisting with care  Laboratory findings - Vitamin B12 level checked in summer, result normal     Patient Active Problem List   Diagnosis Date Noted   Chronic venous insufficiency 12/25/2023   Type 2 diabetes mellitus with diabetic chronic kidney disease (HCC) 12/14/2023   Need for influenza vaccination 09/07/2023   CKD stage 3b, GFR 30-44 ml/min (HCC) 06/07/2023   Prolonged QT interval 08/11/2022   Mixed hyperlipidemia 08/11/2022   Congestive heart failure (CHF) (HCC) 08/10/2022   Hypokalemia 06/23/2022   Hypomagnesemia 06/23/2022   Hypocalcemia 06/23/2022   Hyperphosphatemia 06/23/2022   Pure hypercholesterolemia 08/22/2021   Stroke (HCC) 01/04/2019   Stroke (cerebrum) (HCC) 01/04/2019   Middle cerebral artery embolism, left (HCC) s/p mechanical thrombectomy 01/04/2019   Gastrointestinal hemorrhage    Anemia 03/26/2018   Melena 02/21/2018   Upper GI bleed 02/13/2018   Coagulopathy 02/13/2018   Weight gain 11/30/2015   Coronary artery disease involving coronary bypass  graft of native heart without angina pectoris    Tachycardia    Leukocytosis    Abnormality of gait    Acute blood loss anemia    Acute pulmonary edema (HCC)    Acute congestive heart failure (HCC)    Acute respiratory failure with hypoxia (HCC)    Pressure ulcer 11/09/2015   Necrotic toes (HCC) 11/08/2015   Lower extremity cellulitis 11/08/2015   Bradycardia 05/28/2015   PVD (peripheral vascular disease) with claudication 09/30/2014   Peripheral vascular disease 08/06/2012   Chronic anticoagulation 01/20/2011   Prediabetes 03/04/2010   Dyslipidemia 03/04/2010   Essential hypertension 03/04/2010   Hx of CABG 03/04/2010   Permanent atrial fibrillation (HCC) 04/08/2009    ROS    Objective:     BP 136/70 (BP Location: Left Arm, Patient Position: Sitting, Cuff Size: Normal)   Pulse 80   Ht 5' 9 (1.753 m)   Wt 175 lb (79.4 kg)   SpO2 96%   BMI 25.84 kg/m  BP Readings from Last 3 Encounters:  09/23/24 136/70  09/12/24 128/64  08/19/24 (!) 141/75   Wt Readings from Last 3 Encounters:  09/23/24 175 lb (79.4 kg)  09/12/24 175 lb (79.4 kg)  09/04/24 170 lb (77.1 kg)     Physical Exam Vitals reviewed. Exam conducted with a chaperone present (daughter is with patient).  Constitutional:      General: He is not in acute distress.    Appearance: Normal appearance. He is not ill-appearing.     Comments: Examined in wheelchair  HENT:     Head: Normocephalic and atraumatic.     Mouth/Throat:     Mouth: Mucous membranes are moist. Injury present.     Pharynx: Oropharynx is clear.   Cardiovascular:     Rate and Rhythm: Normal rate. Rhythm irregular.     Pulses: Normal pulses.     Heart sounds: Normal heart sounds. No murmur heard. Pulmonary:     Effort: Pulmonary effort is normal.     Breath sounds: Normal breath sounds.  Musculoskeletal:     Cervical back: Normal range of motion.     Comments: Left AKA  Skin:    General: Skin is warm and dry.     Comments: Clean,  dry, intact dressing on right lower leg  Neurological:     General: No focal deficit present.     Mental Status: He is alert and oriented to person, place, and time.     Gait: Gait abnormal (ambulates with WC).  Psychiatric:        Mood and Affect: Mood normal.        Behavior: Behavior normal.        Thought Content: Thought content normal.      No results found for any visits on 09/23/24.    The ASCVD Risk score (Arnett DK, et al., 2019) failed to calculate for the following reasons:   The 2019 ASCVD risk score is only valid for ages 60 to 68   Risk score cannot be calculated because patient has a medical history suggesting prior/existing ASCVD    Assessment & Plan:   Problem List Items Addressed This Visit       Cardiovascular and Mediastinum   PVD (peripheral vascular disease) with claudication   Leg ulcer healing well with one persistent area. No infection.  - Continue home health services for leg ulcer care.      Relevant Orders   Lipid panel     Endocrine   Type 2 diabetes mellitus with diabetic chronic kidney disease (HCC) - Primary   - Recheck A1c today. No refills needed at this time      Relevant Orders   CBC with Differential/Platelet   CMP14+EGFR   Lipid panel   Hemoglobin A1c     Genitourinary   CKD stage 3b, GFR 30-44 ml/min (HCC)   He remains on Farxiga .  Repeat labs ordered today.      Relevant Orders   CBC with Differential/Platelet   CMP14+EGFR     Other   Dyslipidemia   Hyperlipidemia managed with pravastatin .       Relevant Orders   CMP14+EGFR   Lipid panel    Return in about 6 months (around 03/24/2025) for chronic follow-up with PCP.    Leita Longs, FNP

## 2024-09-23 NOTE — Assessment & Plan Note (Signed)
 Hyperlipidemia managed with pravastatin .

## 2024-09-24 LAB — CMP14+EGFR
ALT: 10 IU/L (ref 0–44)
AST: 19 IU/L (ref 0–40)
Albumin: 4 g/dL (ref 3.7–4.7)
Alkaline Phosphatase: 98 IU/L (ref 48–129)
BUN/Creatinine Ratio: 18 (ref 10–24)
BUN: 36 mg/dL — ABNORMAL HIGH (ref 8–27)
Bilirubin Total: 0.4 mg/dL (ref 0.0–1.2)
CO2: 25 mmol/L (ref 20–29)
Calcium: 9.7 mg/dL (ref 8.6–10.2)
Chloride: 99 mmol/L (ref 96–106)
Creatinine, Ser: 2.03 mg/dL — ABNORMAL HIGH (ref 0.76–1.27)
Globulin, Total: 3.1 g/dL (ref 1.5–4.5)
Glucose: 100 mg/dL — ABNORMAL HIGH (ref 70–99)
Potassium: 4.9 mmol/L (ref 3.5–5.2)
Sodium: 140 mmol/L (ref 134–144)
Total Protein: 7.1 g/dL (ref 6.0–8.5)
eGFR: 31 mL/min/1.73 — ABNORMAL LOW (ref >59)

## 2024-09-24 LAB — CBC WITH DIFFERENTIAL/PLATELET
Basophils Absolute: 0.1 x10E3/uL (ref 0.0–0.2)
Basos: 1 %
EOS (ABSOLUTE): 0.5 x10E3/uL — ABNORMAL HIGH (ref 0.0–0.4)
Eos: 7 %
Hematocrit: 38.8 % (ref 37.5–51.0)
Hemoglobin: 12.8 g/dL — ABNORMAL LOW (ref 13.0–17.7)
Immature Grans (Abs): 0 x10E3/uL (ref 0.0–0.1)
Immature Granulocytes: 0 %
Lymphocytes Absolute: 1.5 x10E3/uL (ref 0.7–3.1)
Lymphs: 22 %
MCH: 31.9 pg (ref 26.6–33.0)
MCHC: 33 g/dL (ref 31.5–35.7)
MCV: 97 fL (ref 79–97)
Monocytes Absolute: 0.6 x10E3/uL (ref 0.1–0.9)
Monocytes: 9 %
Neutrophils Absolute: 4.1 x10E3/uL (ref 1.4–7.0)
Neutrophils: 61 %
Platelets: 325 x10E3/uL (ref 150–450)
RBC: 4.01 x10E6/uL — ABNORMAL LOW (ref 4.14–5.80)
RDW: 13.3 % (ref 11.6–15.4)
WBC: 6.8 x10E3/uL (ref 3.4–10.8)

## 2024-09-24 LAB — LIPID PANEL
Chol/HDL Ratio: 3.1 ratio (ref 0.0–5.0)
Cholesterol, Total: 112 mg/dL (ref 100–199)
HDL: 36 mg/dL — ABNORMAL LOW (ref >39)
LDL Chol Calc (NIH): 52 mg/dL (ref 0–99)
Triglycerides: 139 mg/dL (ref 0–149)
VLDL Cholesterol Cal: 24 mg/dL (ref 5–40)

## 2024-09-24 LAB — HEMOGLOBIN A1C
Est. average glucose Bld gHb Est-mCnc: 137 mg/dL
Hgb A1c MFr Bld: 6.4 % — ABNORMAL HIGH (ref 4.8–5.6)

## 2024-09-28 ENCOUNTER — Other Ambulatory Visit: Payer: Self-pay

## 2024-10-01 ENCOUNTER — Encounter: Payer: Self-pay | Admitting: Physician Assistant

## 2024-10-24 ENCOUNTER — Telehealth: Payer: Self-pay

## 2024-10-24 NOTE — Telephone Encounter (Signed)
 10/22/24  [name], RN with [facility] called reporting pt's right foot continues to leak serous fluid.  She reported no new ulcerations.  10/24/24 Called patient to inquire about his right foot.  He stated that the [facility] RN had rewrapped his foot and leg with an [employer] and that it is doing OK right now.  Advised patient to have [facility] RN call back during her next visit.  Advised patient to keep foot and leg elevated above heart level.

## 2024-10-27 DIAGNOSIS — I5030 Unspecified diastolic (congestive) heart failure: Secondary | ICD-10-CM

## 2024-10-30 ENCOUNTER — Telehealth: Payer: Self-pay

## 2024-10-30 NOTE — Telephone Encounter (Signed)
 [name], RN, pt's [facility] nurse called reporting continuing drainage from right foot and blood-red toes.  She reported wounds are healed and patient is not in pain.  She continues to place [facility] on pt's right leg.  Called patient back to encourage him to elevate his right leg.  He reported no pain and that the foot stops draining when he elevates his leg.  Advised to call PCP or cardiology to discuss fluid retention.

## 2024-10-31 ENCOUNTER — Other Ambulatory Visit: Payer: Self-pay

## 2024-10-31 DIAGNOSIS — L97909 Non-pressure chronic ulcer of unspecified part of unspecified lower leg with unspecified severity: Secondary | ICD-10-CM

## 2024-10-31 DIAGNOSIS — I739 Peripheral vascular disease, unspecified: Secondary | ICD-10-CM

## 2024-11-12 ENCOUNTER — Encounter: Payer: Self-pay | Admitting: Physician Assistant

## 2024-11-24 ENCOUNTER — Encounter: Payer: Self-pay | Admitting: Physician Assistant

## 2024-11-24 ENCOUNTER — Ambulatory Visit (HOSPITAL_COMMUNITY)
Admission: RE | Admit: 2024-11-24 | Discharge: 2024-11-24 | Disposition: A | Discharge status: Home / Self Care | Source: Ambulatory Visit | Attending: Surgery | Admitting: Surgery

## 2024-11-24 ENCOUNTER — Ambulatory Visit (HOSPITAL_BASED_OUTPATIENT_CLINIC_OR_DEPARTMENT_OTHER)
Admission: RE | Admit: 2024-11-24 | Discharge: 2024-11-24 | Disposition: A | Discharge status: Home / Self Care | Source: Ambulatory Visit | Attending: Surgery | Admitting: Surgery

## 2024-11-24 ENCOUNTER — Ambulatory Visit: Admitting: Physician Assistant

## 2024-11-24 VITALS — BP 162/85 | HR 73 | Ht 69.0 in | Wt 175.0 lb

## 2024-11-24 DIAGNOSIS — L97909 Non-pressure chronic ulcer of unspecified part of unspecified lower leg with unspecified severity: Secondary | ICD-10-CM | POA: Diagnosis not present

## 2024-11-24 DIAGNOSIS — I70299 Other atherosclerosis of native arteries of extremities, unspecified extremity: Secondary | ICD-10-CM

## 2024-11-24 DIAGNOSIS — I872 Venous insufficiency (chronic) (peripheral): Secondary | ICD-10-CM | POA: Diagnosis not present

## 2024-11-24 DIAGNOSIS — I739 Peripheral vascular disease, unspecified: Secondary | ICD-10-CM

## 2024-11-24 LAB — VAS US ABI WITH/WO TBI

## 2024-11-27 ENCOUNTER — Encounter: Payer: Self-pay | Admitting: Podiatry

## 2024-11-27 ENCOUNTER — Ambulatory Visit: Admitting: Podiatry

## 2024-11-27 DIAGNOSIS — B351 Tinea unguium: Secondary | ICD-10-CM

## 2024-11-28 NOTE — Progress Notes (Signed)
 Subjective:   Patient ID: [name], male   DOB: 89 y.o.   MRN: [id number]   HPI patient presents with caregiver with loss of left lower leg and patient on the right is wearing a Unna boot but has nail disease with irritation between the toes but has just had Unna boot applied to the lower leg      ROS      Objective:  Physical Exam  Was not able to do neurovascular but I did note digital flow with patient having incurvated nailbeds that are thickened right foot unable to evaluate the fifth digit that well due to the type of dressing applied     Assessment:  Mycotic nail infection 1-4 right with severe Co. conditions     Plan:  H&P done discussed continued usage with wound care specialist and vein doctor that he sees and I debrided nailbeds 1-4 right with slight bleeding second nail applied Band-Aid it was just local on the surface and should not be any issue will let us  know if any issues were to occur and to be seen back as needed

## 2024-12-15 ENCOUNTER — Encounter

## 2025-03-24 ENCOUNTER — Ambulatory Visit: Payer: Self-pay

## 2025-09-07 ENCOUNTER — Ambulatory Visit
